# Patient Record
Sex: Female | Born: 1935
Health system: Southern US, Community
[De-identification: ages and names within clinical notes are randomized; demographics above are authoritative.]

## PROBLEM LIST (undated history)

## (undated) DIAGNOSIS — F329 Major depressive disorder, single episode, unspecified: Secondary | ICD-10-CM

## (undated) DIAGNOSIS — D500 Iron deficiency anemia secondary to blood loss (chronic): Secondary | ICD-10-CM

## (undated) DIAGNOSIS — K8020 Calculus of gallbladder without cholecystitis without obstruction: Secondary | ICD-10-CM

## (undated) DIAGNOSIS — R0600 Dyspnea, unspecified: Secondary | ICD-10-CM

## (undated) DIAGNOSIS — M1990 Unspecified osteoarthritis, unspecified site: Secondary | ICD-10-CM

## (undated) DIAGNOSIS — G4710 Hypersomnia, unspecified: Secondary | ICD-10-CM

## (undated) DIAGNOSIS — F0390 Unspecified dementia without behavioral disturbance: Secondary | ICD-10-CM

## (undated) DIAGNOSIS — N289 Disorder of kidney and ureter, unspecified: Secondary | ICD-10-CM

## (undated) DIAGNOSIS — K5790 Diverticulosis of intestine, part unspecified, without perforation or abscess without bleeding: Secondary | ICD-10-CM

## (undated) DIAGNOSIS — S2239XA Fracture of one rib, unspecified side, initial encounter for closed fracture: Secondary | ICD-10-CM

## (undated) DIAGNOSIS — K921 Melena: Secondary | ICD-10-CM

## (undated) DIAGNOSIS — R0989 Other specified symptoms and signs involving the circulatory and respiratory systems: Secondary | ICD-10-CM

## (undated) DIAGNOSIS — E1142 Type 2 diabetes mellitus with diabetic polyneuropathy: Secondary | ICD-10-CM

## (undated) DIAGNOSIS — K922 Gastrointestinal hemorrhage, unspecified: Secondary | ICD-10-CM

## (undated) DIAGNOSIS — R634 Abnormal weight loss: Secondary | ICD-10-CM

## (undated) DIAGNOSIS — G453 Amaurosis fugax: Secondary | ICD-10-CM

## (undated) DIAGNOSIS — I639 Cerebral infarction, unspecified: Secondary | ICD-10-CM

## (undated) DIAGNOSIS — E519 Thiamine deficiency, unspecified: Secondary | ICD-10-CM

## (undated) DIAGNOSIS — N184 Chronic kidney disease, stage 4 (severe): Secondary | ICD-10-CM

## (undated) DIAGNOSIS — I739 Peripheral vascular disease, unspecified: Secondary | ICD-10-CM

## (undated) DIAGNOSIS — R600 Localized edema: Secondary | ICD-10-CM

## (undated) DIAGNOSIS — E119 Type 2 diabetes mellitus without complications: Secondary | ICD-10-CM

## (undated) DIAGNOSIS — E785 Hyperlipidemia, unspecified: Secondary | ICD-10-CM

## (undated) DIAGNOSIS — I10 Essential (primary) hypertension: Secondary | ICD-10-CM

## (undated) DIAGNOSIS — I2510 Atherosclerotic heart disease of native coronary artery without angina pectoris: Secondary | ICD-10-CM

## (undated) HISTORY — DX: Localized edema: R60.0

## (undated) HISTORY — DX: Unspecified dementia, unspecified severity, without behavioral disturbance, psychotic disturbance, mood disturbance, and anxiety: F03.90

## (undated) HISTORY — DX: Type 2 diabetes mellitus without complications: E11.9

## (undated) HISTORY — DX: Fracture of one rib, unspecified side, initial encounter for closed fracture: S22.39XA

## (undated) HISTORY — DX: Gastrointestinal hemorrhage, unspecified: K92.2

## (undated) HISTORY — DX: Unspecified osteoarthritis, unspecified site: M19.90

## (undated) HISTORY — DX: Thiamine deficiency, unspecified: E51.9

## (undated) HISTORY — DX: Amaurosis fugax: G45.3

## (undated) HISTORY — DX: Hypersomnia, unspecified: G47.10

## (undated) HISTORY — DX: Type 2 diabetes mellitus with diabetic polyneuropathy: E11.42

## (undated) HISTORY — DX: Peripheral vascular disease, unspecified: I73.9

## (undated) HISTORY — DX: Other specified symptoms and signs involving the circulatory and respiratory systems: R09.89

## (undated) HISTORY — DX: Atherosclerotic heart disease of native coronary artery without angina pectoris: I25.10

## (undated) HISTORY — PX: ABDOMINAL HYSTERECTOMY: SHX81

## (undated) HISTORY — DX: Disorder of kidney and ureter, unspecified: N28.9

## (undated) HISTORY — DX: Abnormal weight loss: R63.4

## (undated) HISTORY — DX: Hyperlipidemia, unspecified: E78.5

## (undated) HISTORY — DX: Diverticulosis of intestine, part unspecified, without perforation or abscess without bleeding: K57.90

## (undated) HISTORY — DX: Major depressive disorder, single episode, unspecified: F32.9

## (undated) HISTORY — DX: Iron deficiency anemia secondary to blood loss (chronic): D50.0

## (undated) HISTORY — DX: Cerebral infarction, unspecified: I63.9

## (undated) HISTORY — DX: Essential (primary) hypertension: I10

---

## 1997-03-16 HISTORY — PX: CORONARY ARTERY BYPASS GRAFT: SHX141

## 1997-11-30 ENCOUNTER — Inpatient Hospital Stay (HOSPITAL_COMMUNITY): Admission: AD | Admit: 1997-11-30 | Discharge: 1997-12-14 | Payer: Self-pay | Admitting: Interventional Cardiology

## 1997-12-05 ENCOUNTER — Encounter: Payer: Self-pay | Admitting: Cardiothoracic Surgery

## 1997-12-06 ENCOUNTER — Encounter: Payer: Self-pay | Admitting: Cardiothoracic Surgery

## 1997-12-07 ENCOUNTER — Encounter: Payer: Self-pay | Admitting: Cardiothoracic Surgery

## 1997-12-09 ENCOUNTER — Encounter: Payer: Self-pay | Admitting: Cardiothoracic Surgery

## 1997-12-09 ENCOUNTER — Encounter: Payer: Self-pay | Admitting: Pediatrics

## 1997-12-10 ENCOUNTER — Encounter: Payer: Self-pay | Admitting: Cardiothoracic Surgery

## 1997-12-18 ENCOUNTER — Encounter: Admission: RE | Admit: 1997-12-18 | Discharge: 1998-03-18 | Payer: Self-pay | Admitting: Cardiothoracic Surgery

## 1997-12-21 ENCOUNTER — Ambulatory Visit (HOSPITAL_COMMUNITY): Admission: RE | Admit: 1997-12-21 | Discharge: 1997-12-21 | Payer: Self-pay | Admitting: Cardiothoracic Surgery

## 1998-01-08 ENCOUNTER — Encounter (HOSPITAL_COMMUNITY): Admission: RE | Admit: 1998-01-08 | Discharge: 1998-04-08 | Payer: Self-pay | Admitting: Interventional Cardiology

## 1998-10-04 ENCOUNTER — Ambulatory Visit (HOSPITAL_COMMUNITY): Admission: RE | Admit: 1998-10-04 | Discharge: 1998-10-04 | Payer: Self-pay | Admitting: Obstetrics & Gynecology

## 2000-03-02 ENCOUNTER — Encounter: Admission: RE | Admit: 2000-03-02 | Discharge: 2000-03-02 | Payer: Self-pay | Admitting: *Deleted

## 2000-03-02 ENCOUNTER — Encounter: Payer: Self-pay | Admitting: *Deleted

## 2001-03-14 ENCOUNTER — Encounter: Payer: Self-pay | Admitting: Family Medicine

## 2001-03-14 ENCOUNTER — Encounter: Admission: RE | Admit: 2001-03-14 | Discharge: 2001-03-14 | Payer: Self-pay | Admitting: Family Medicine

## 2002-02-28 ENCOUNTER — Encounter: Payer: Self-pay | Admitting: Internal Medicine

## 2002-02-28 ENCOUNTER — Encounter: Admission: RE | Admit: 2002-02-28 | Discharge: 2002-02-28 | Payer: Self-pay | Admitting: Internal Medicine

## 2002-07-29 ENCOUNTER — Emergency Department (HOSPITAL_COMMUNITY): Admission: EM | Admit: 2002-07-29 | Discharge: 2002-07-29 | Payer: Self-pay | Admitting: Physical Therapy

## 2003-03-02 ENCOUNTER — Other Ambulatory Visit: Admission: RE | Admit: 2003-03-02 | Discharge: 2003-03-02 | Payer: Self-pay | Admitting: Family Medicine

## 2003-11-01 ENCOUNTER — Ambulatory Visit (HOSPITAL_COMMUNITY): Admission: RE | Admit: 2003-11-01 | Discharge: 2003-11-01 | Payer: Self-pay | Admitting: Family Medicine

## 2005-03-12 ENCOUNTER — Ambulatory Visit: Admission: RE | Admit: 2005-03-12 | Discharge: 2005-03-12 | Payer: Self-pay | Admitting: Family Medicine

## 2005-03-12 ENCOUNTER — Encounter (INDEPENDENT_AMBULATORY_CARE_PROVIDER_SITE_OTHER): Payer: Self-pay | Admitting: Interventional Cardiology

## 2005-10-05 ENCOUNTER — Ambulatory Visit: Payer: Self-pay | Admitting: Cardiovascular Disease

## 2005-10-14 ENCOUNTER — Ambulatory Visit: Payer: Self-pay

## 2006-03-25 ENCOUNTER — Ambulatory Visit: Payer: Self-pay | Admitting: Cardiovascular Disease

## 2006-09-27 ENCOUNTER — Ambulatory Visit: Payer: Self-pay | Admitting: Cardiovascular Disease

## 2006-10-15 ENCOUNTER — Ambulatory Visit: Payer: Self-pay

## 2007-03-17 DIAGNOSIS — K8020 Calculus of gallbladder without cholecystitis without obstruction: Secondary | ICD-10-CM

## 2007-03-17 HISTORY — DX: Calculus of gallbladder without cholecystitis without obstruction: K80.20

## 2007-06-21 ENCOUNTER — Ambulatory Visit: Payer: Self-pay | Admitting: Internal Medicine

## 2007-06-21 DIAGNOSIS — I739 Peripheral vascular disease, unspecified: Secondary | ICD-10-CM

## 2007-06-21 DIAGNOSIS — R0989 Other specified symptoms and signs involving the circulatory and respiratory systems: Secondary | ICD-10-CM

## 2007-06-21 LAB — CONVERTED CEMR LAB

## 2007-06-22 DIAGNOSIS — I10 Essential (primary) hypertension: Secondary | ICD-10-CM

## 2007-06-22 DIAGNOSIS — E785 Hyperlipidemia, unspecified: Secondary | ICD-10-CM

## 2007-06-22 DIAGNOSIS — E119 Type 2 diabetes mellitus without complications: Secondary | ICD-10-CM

## 2007-07-08 ENCOUNTER — Telehealth: Payer: Self-pay | Admitting: Internal Medicine

## 2007-07-08 ENCOUNTER — Ambulatory Visit: Payer: Self-pay | Admitting: Internal Medicine

## 2007-07-08 ENCOUNTER — Ambulatory Visit: Payer: Self-pay

## 2007-07-10 ENCOUNTER — Encounter: Payer: Self-pay | Admitting: Internal Medicine

## 2007-07-12 ENCOUNTER — Telehealth: Payer: Self-pay | Admitting: Internal Medicine

## 2007-07-27 ENCOUNTER — Ambulatory Visit: Payer: Self-pay | Admitting: Cardiovascular Disease

## 2007-08-04 ENCOUNTER — Ambulatory Visit: Payer: Self-pay | Admitting: Internal Medicine

## 2007-08-10 ENCOUNTER — Telehealth: Payer: Self-pay | Admitting: Internal Medicine

## 2007-08-11 ENCOUNTER — Telehealth (INDEPENDENT_AMBULATORY_CARE_PROVIDER_SITE_OTHER): Payer: Self-pay | Admitting: *Deleted

## 2007-08-12 ENCOUNTER — Inpatient Hospital Stay (HOSPITAL_COMMUNITY): Admission: AD | Admit: 2007-08-12 | Discharge: 2007-08-14 | Payer: Self-pay | Admitting: Internal Medicine

## 2007-08-12 ENCOUNTER — Encounter: Payer: Self-pay | Admitting: Internal Medicine

## 2007-08-12 ENCOUNTER — Ambulatory Visit: Payer: Self-pay | Admitting: Cardiology

## 2007-08-12 ENCOUNTER — Ambulatory Visit: Payer: Self-pay | Admitting: Internal Medicine

## 2007-08-12 DIAGNOSIS — H3400 Transient retinal artery occlusion, unspecified eye: Secondary | ICD-10-CM

## 2007-08-19 ENCOUNTER — Ambulatory Visit: Payer: Self-pay | Admitting: Cardiovascular Disease

## 2007-08-23 ENCOUNTER — Ambulatory Visit: Payer: Self-pay | Admitting: Internal Medicine

## 2007-08-25 ENCOUNTER — Ambulatory Visit: Payer: Self-pay | Admitting: Cardiology

## 2007-09-26 ENCOUNTER — Ambulatory Visit: Payer: Self-pay | Admitting: Internal Medicine

## 2007-09-26 LAB — CONVERTED CEMR LAB
ALT: 21 units/L (ref 0–35)
LDL Cholesterol: 83 mg/dL (ref 0–99)
Total CHOL/HDL Ratio: 3

## 2007-10-05 ENCOUNTER — Ambulatory Visit: Payer: Self-pay | Admitting: Internal Medicine

## 2007-11-17 ENCOUNTER — Telehealth: Payer: Self-pay | Admitting: Internal Medicine

## 2007-12-26 ENCOUNTER — Ambulatory Visit: Payer: Self-pay | Admitting: Internal Medicine

## 2007-12-26 LAB — CONVERTED CEMR LAB
Creatinine,U: 90.1 mg/dL
Eosinophils Absolute: 0.2 10*3/uL (ref 0.0–0.7)
Eosinophils Relative: 2.8 % (ref 0.0–5.0)
MCV: 90 fL (ref 78.0–100.0)
Microalb, Ur: 180.5 mg/dL — ABNORMAL HIGH (ref 0.0–1.9)
Monocytes Relative: 8.3 % (ref 3.0–12.0)
Neutrophils Relative %: 57.7 % (ref 43.0–77.0)
Platelets: 197 10*3/uL (ref 150–400)
Saturation Ratios: 22.3 % (ref 20.0–50.0)
WBC: 8 10*3/uL (ref 4.5–10.5)

## 2008-01-09 ENCOUNTER — Ambulatory Visit: Payer: Self-pay | Admitting: Internal Medicine

## 2008-01-13 ENCOUNTER — Encounter: Payer: Self-pay | Admitting: Internal Medicine

## 2008-01-16 ENCOUNTER — Ambulatory Visit: Payer: Self-pay | Admitting: Cardiovascular Disease

## 2008-02-06 ENCOUNTER — Telehealth: Payer: Self-pay | Admitting: Internal Medicine

## 2008-02-08 ENCOUNTER — Encounter: Payer: Self-pay | Admitting: Internal Medicine

## 2008-02-20 ENCOUNTER — Ambulatory Visit: Payer: Self-pay | Admitting: Internal Medicine

## 2008-02-20 ENCOUNTER — Ambulatory Visit: Payer: Self-pay | Admitting: Diagnostic Radiology

## 2008-02-20 ENCOUNTER — Encounter: Payer: Self-pay | Admitting: Emergency Medicine

## 2008-02-20 DIAGNOSIS — R5381 Other malaise: Secondary | ICD-10-CM

## 2008-02-20 DIAGNOSIS — R5383 Other fatigue: Secondary | ICD-10-CM

## 2008-02-21 ENCOUNTER — Inpatient Hospital Stay (HOSPITAL_COMMUNITY): Admission: EM | Admit: 2008-02-21 | Discharge: 2008-02-24 | Payer: Self-pay | Admitting: Internal Medicine

## 2008-02-21 ENCOUNTER — Ambulatory Visit: Payer: Self-pay | Admitting: Internal Medicine

## 2008-02-22 ENCOUNTER — Encounter: Payer: Self-pay | Admitting: Internal Medicine

## 2008-02-22 HISTORY — PX: UPPER GASTROINTESTINAL ENDOSCOPY: SHX188

## 2008-02-24 ENCOUNTER — Encounter: Payer: Self-pay | Admitting: Internal Medicine

## 2008-02-28 ENCOUNTER — Ambulatory Visit: Payer: Self-pay | Admitting: Internal Medicine

## 2008-02-28 DIAGNOSIS — K3183 Achlorhydria: Secondary | ICD-10-CM | POA: Insufficient documentation

## 2008-02-28 DIAGNOSIS — R197 Diarrhea, unspecified: Secondary | ICD-10-CM

## 2008-02-28 LAB — CONVERTED CEMR LAB
BUN: 27 mg/dL — ABNORMAL HIGH (ref 6–23)
Basophils Absolute: 0.1 10*3/uL (ref 0.0–0.1)
Basophils Relative: 0.9 % (ref 0.0–3.0)
CO2: 25 meq/L (ref 19–32)
Calcium: 9.4 mg/dL (ref 8.4–10.5)
Chloride: 113 meq/L — ABNORMAL HIGH (ref 96–112)
Creatinine, Ser: 1.2 mg/dL (ref 0.4–1.2)
Eosinophils Absolute: 0.2 10*3/uL (ref 0.0–0.7)
Eosinophils Relative: 1.8 % (ref 0.0–5.0)
GFR calc Af Amer: 56 mL/min
GFR calc non Af Amer: 47 mL/min
Glucose, Bld: 73 mg/dL (ref 70–99)
HCT: 30.9 % — ABNORMAL LOW (ref 36.0–46.0)
Hemoglobin: 10.7 g/dL — ABNORMAL LOW (ref 12.0–15.0)
Lymphocytes Relative: 18.2 % (ref 12.0–46.0)
MCHC: 34.6 g/dL (ref 30.0–36.0)
MCV: 89.2 fL (ref 78.0–100.0)
Monocytes Absolute: 0.6 10*3/uL (ref 0.1–1.0)
Monocytes Relative: 6 % (ref 3.0–12.0)
Neutro Abs: 7.7 10*3/uL (ref 1.4–7.7)
Neutrophils Relative %: 73.1 % (ref 43.0–77.0)
Platelets: 269 10*3/uL (ref 150–400)
Potassium: 5.2 meq/L — ABNORMAL HIGH (ref 3.5–5.1)
RBC: 3.47 M/uL — ABNORMAL LOW (ref 3.87–5.11)
RDW: 13.9 % (ref 11.5–14.6)
Sodium: 144 meq/L (ref 135–145)
WBC: 10.5 10*3/uL (ref 4.5–10.5)

## 2008-02-29 ENCOUNTER — Telehealth (INDEPENDENT_AMBULATORY_CARE_PROVIDER_SITE_OTHER): Payer: Self-pay | Admitting: *Deleted

## 2008-02-29 DIAGNOSIS — E875 Hyperkalemia: Secondary | ICD-10-CM | POA: Insufficient documentation

## 2008-03-02 ENCOUNTER — Encounter: Payer: Self-pay | Admitting: Internal Medicine

## 2008-03-04 ENCOUNTER — Telehealth (INDEPENDENT_AMBULATORY_CARE_PROVIDER_SITE_OTHER): Payer: Self-pay | Admitting: *Deleted

## 2008-03-19 ENCOUNTER — Encounter: Payer: Self-pay | Admitting: Internal Medicine

## 2008-03-23 ENCOUNTER — Ambulatory Visit: Payer: Self-pay | Admitting: Internal Medicine

## 2008-03-23 DIAGNOSIS — K260 Acute duodenal ulcer with hemorrhage: Secondary | ICD-10-CM | POA: Insufficient documentation

## 2008-03-26 ENCOUNTER — Ambulatory Visit: Payer: Self-pay | Admitting: Internal Medicine

## 2008-03-26 LAB — CONVERTED CEMR LAB
ALT: 14 units/L (ref 0–35)
BUN: 18 mg/dL (ref 6–23)
Basophils Absolute: 0.1 10*3/uL (ref 0.0–0.1)
Chloride: 109 meq/L (ref 96–112)
Cholesterol: 145 mg/dL (ref 0–200)
Creatinine, Ser: 0.9 mg/dL (ref 0.4–1.2)
GFR calc Af Amer: 79 mL/min
GFR calc non Af Amer: 65 mL/min
Glucose, Bld: 75 mg/dL (ref 70–99)
Hgb A1c MFr Bld: 5.8 % (ref 4.6–6.0)
LDL Cholesterol: 67 mg/dL (ref 0–99)
Lymphocytes Relative: 35.9 % (ref 12.0–46.0)
MCHC: 34.6 g/dL (ref 30.0–36.0)
Microalb Creat Ratio: 1157.3 mg/g — ABNORMAL HIGH (ref 0.0–30.0)
Monocytes Relative: 9.4 % (ref 3.0–12.0)
Neutrophils Relative %: 51.3 % (ref 43.0–77.0)
Platelets: 184 10*3/uL (ref 150–400)
RDW: 13.6 % (ref 11.5–14.6)
Triglycerides: 109 mg/dL (ref 0–149)
VLDL: 22 mg/dL (ref 0–40)

## 2008-03-30 ENCOUNTER — Ambulatory Visit: Payer: Self-pay | Admitting: Internal Medicine

## 2008-03-30 LAB — HM DIABETES FOOT EXAM

## 2008-04-09 ENCOUNTER — Encounter: Payer: Self-pay | Admitting: Internal Medicine

## 2008-04-18 ENCOUNTER — Encounter: Payer: Self-pay | Admitting: Internal Medicine

## 2008-04-26 ENCOUNTER — Encounter: Payer: Self-pay | Admitting: Internal Medicine

## 2008-05-21 ENCOUNTER — Ambulatory Visit: Payer: Self-pay | Admitting: Internal Medicine

## 2008-05-21 ENCOUNTER — Telehealth (INDEPENDENT_AMBULATORY_CARE_PROVIDER_SITE_OTHER): Payer: Self-pay | Admitting: *Deleted

## 2008-05-21 LAB — CONVERTED CEMR LAB
Calcium: 9.2 mg/dL (ref 8.4–10.5)
GFR calc Af Amer: 78 mL/min
Glucose, Bld: 112 mg/dL — ABNORMAL HIGH (ref 70–99)
Potassium: 4.4 meq/L (ref 3.5–5.1)
Sodium: 150 meq/L — ABNORMAL HIGH (ref 135–145)

## 2008-05-23 ENCOUNTER — Ambulatory Visit: Payer: Self-pay | Admitting: Internal Medicine

## 2008-05-23 DIAGNOSIS — E870 Hyperosmolality and hypernatremia: Secondary | ICD-10-CM

## 2008-06-25 ENCOUNTER — Ambulatory Visit: Payer: Self-pay | Admitting: Internal Medicine

## 2008-06-25 LAB — CONVERTED CEMR LAB
CO2: 29 meq/L (ref 19–32)
Calcium: 9.5 mg/dL (ref 8.4–10.5)
Creatinine, Ser: 1 mg/dL (ref 0.4–1.2)
Glucose, Bld: 149 mg/dL — ABNORMAL HIGH (ref 70–99)
Sodium: 143 meq/L (ref 135–145)

## 2008-06-26 ENCOUNTER — Ambulatory Visit: Payer: Self-pay | Admitting: Internal Medicine

## 2008-07-04 DIAGNOSIS — I6350 Cerebral infarction due to unspecified occlusion or stenosis of unspecified cerebral artery: Secondary | ICD-10-CM | POA: Insufficient documentation

## 2008-07-04 DIAGNOSIS — R011 Cardiac murmur, unspecified: Secondary | ICD-10-CM

## 2008-07-09 ENCOUNTER — Ambulatory Visit: Payer: Self-pay | Admitting: Cardiovascular Disease

## 2008-07-12 ENCOUNTER — Encounter: Payer: Self-pay | Admitting: Cardiovascular Disease

## 2008-08-24 ENCOUNTER — Ambulatory Visit: Payer: Self-pay | Admitting: Internal Medicine

## 2008-08-24 LAB — CONVERTED CEMR LAB
BUN: 19 mg/dL (ref 6–23)
Calcium: 9.3 mg/dL (ref 8.4–10.5)
Creatinine, Ser: 1.2 mg/dL (ref 0.4–1.2)
GFR calc non Af Amer: 46.5 mL/min (ref >60)
Hgb A1c MFr Bld: 6.1 % (ref 4.6–6.5)

## 2008-08-28 ENCOUNTER — Ambulatory Visit: Payer: Self-pay | Admitting: Internal Medicine

## 2008-08-28 DIAGNOSIS — E1140 Type 2 diabetes mellitus with diabetic neuropathy, unspecified: Secondary | ICD-10-CM | POA: Insufficient documentation

## 2008-12-24 ENCOUNTER — Ambulatory Visit: Payer: Self-pay | Admitting: Internal Medicine

## 2008-12-24 LAB — CONVERTED CEMR LAB
BUN: 29 mg/dL — ABNORMAL HIGH (ref 6–23)
Creatinine, Ser: 1.3 mg/dL — ABNORMAL HIGH (ref 0.4–1.2)
GFR calc non Af Amer: 42.36 mL/min (ref >60)
HDL: 52.7 mg/dL (ref >39.00)
Hgb A1c MFr Bld: 5.9 % (ref 4.6–6.5)
Ketones, ur: NEGATIVE mg/dL
LDL Cholesterol: 84 mg/dL (ref 0–99)
Specific Gravity, Urine: 1.01 (ref 1.000–1.030)
TSH: 1.56 microintl units/mL (ref 0.35–5.50)
Total Bilirubin: 0.7 mg/dL (ref 0.3–1.2)
Total CHOL/HDL Ratio: 3
Urobilinogen, UA: 0.2 (ref 0.0–1.0)

## 2008-12-25 ENCOUNTER — Ambulatory Visit: Payer: Self-pay | Admitting: Internal Medicine

## 2008-12-27 ENCOUNTER — Telehealth (INDEPENDENT_AMBULATORY_CARE_PROVIDER_SITE_OTHER): Payer: Self-pay | Admitting: *Deleted

## 2008-12-31 ENCOUNTER — Ambulatory Visit: Payer: Self-pay

## 2008-12-31 ENCOUNTER — Encounter: Payer: Self-pay | Admitting: Cardiovascular Disease

## 2008-12-31 ENCOUNTER — Ambulatory Visit: Payer: Self-pay | Admitting: Cardiovascular Disease

## 2008-12-31 ENCOUNTER — Ambulatory Visit: Payer: Self-pay | Admitting: Internal Medicine

## 2008-12-31 ENCOUNTER — Encounter (INDEPENDENT_AMBULATORY_CARE_PROVIDER_SITE_OTHER): Payer: Self-pay | Admitting: *Deleted

## 2008-12-31 ENCOUNTER — Encounter (HOSPITAL_COMMUNITY): Admission: RE | Admit: 2008-12-31 | Discharge: 2009-03-12 | Payer: Self-pay | Admitting: Internal Medicine

## 2009-01-03 ENCOUNTER — Observation Stay (HOSPITAL_COMMUNITY): Admission: AD | Admit: 2009-01-03 | Discharge: 2009-01-05 | Payer: Self-pay | Admitting: Cardiovascular Disease

## 2009-01-03 ENCOUNTER — Ambulatory Visit: Payer: Self-pay | Admitting: Cardiovascular Disease

## 2009-01-04 ENCOUNTER — Ambulatory Visit: Payer: Self-pay | Admitting: Vascular Surgery

## 2009-01-04 ENCOUNTER — Encounter: Payer: Self-pay | Admitting: Cardiovascular Disease

## 2009-01-08 ENCOUNTER — Telehealth: Payer: Self-pay | Admitting: Cardiovascular Disease

## 2009-01-11 ENCOUNTER — Telehealth (INDEPENDENT_AMBULATORY_CARE_PROVIDER_SITE_OTHER): Payer: Self-pay | Admitting: *Deleted

## 2009-01-21 ENCOUNTER — Encounter (INDEPENDENT_AMBULATORY_CARE_PROVIDER_SITE_OTHER): Payer: Self-pay | Admitting: *Deleted

## 2009-01-22 ENCOUNTER — Encounter: Payer: Self-pay | Admitting: Physician Assistant

## 2009-01-22 ENCOUNTER — Ambulatory Visit: Payer: Self-pay | Admitting: Cardiology

## 2009-01-28 ENCOUNTER — Ambulatory Visit: Payer: Self-pay | Admitting: Internal Medicine

## 2009-01-28 LAB — CONVERTED CEMR LAB
CO2: 24 meq/L (ref 19–32)
Chloride: 106 meq/L (ref 96–112)
Potassium: 4.9 meq/L (ref 3.5–5.3)
Sodium: 141 meq/L (ref 135–145)

## 2009-01-29 ENCOUNTER — Encounter: Payer: Self-pay | Admitting: Internal Medicine

## 2009-03-25 ENCOUNTER — Ambulatory Visit: Payer: Self-pay | Admitting: Internal Medicine

## 2009-03-25 LAB — CONVERTED CEMR LAB
BUN: 18 mg/dL (ref 6–23)
Chloride: 107 meq/L (ref 96–112)
GFR calc non Af Amer: 51.34 mL/min (ref >60)
Glucose, Bld: 134 mg/dL — ABNORMAL HIGH (ref 70–99)
Potassium: 4.3 meq/L (ref 3.5–5.1)
Sodium: 145 meq/L (ref 135–145)

## 2009-03-28 ENCOUNTER — Ambulatory Visit: Payer: Self-pay | Admitting: Internal Medicine

## 2009-04-15 ENCOUNTER — Ambulatory Visit: Payer: Self-pay | Admitting: Cardiovascular Disease

## 2009-04-17 ENCOUNTER — Telehealth: Payer: Self-pay | Admitting: Internal Medicine

## 2009-04-24 ENCOUNTER — Ambulatory Visit: Payer: Self-pay

## 2009-04-24 ENCOUNTER — Ambulatory Visit: Payer: Self-pay | Admitting: Internal Medicine

## 2009-04-24 ENCOUNTER — Encounter: Payer: Self-pay | Admitting: Cardiovascular Disease

## 2009-04-24 LAB — CONVERTED CEMR LAB
CO2: 27 meq/L (ref 19–32)
Calcium: 9.6 mg/dL (ref 8.4–10.5)
Chloride: 100 meq/L (ref 96–112)
Creatinine, Ser: 1.1 mg/dL (ref 0.4–1.2)
Glucose, Bld: 112 mg/dL — ABNORMAL HIGH (ref 70–99)

## 2009-04-26 ENCOUNTER — Ambulatory Visit: Payer: Self-pay | Admitting: Internal Medicine

## 2009-04-26 DIAGNOSIS — N390 Urinary tract infection, site not specified: Secondary | ICD-10-CM | POA: Insufficient documentation

## 2009-04-26 LAB — CONVERTED CEMR LAB
Bilirubin Urine: NEGATIVE
Ketones, urine, test strip: NEGATIVE
Specific Gravity, Urine: 1.015
pH: 5

## 2009-04-29 ENCOUNTER — Ambulatory Visit: Payer: Self-pay | Admitting: Cardiovascular Disease

## 2009-05-09 ENCOUNTER — Telehealth (INDEPENDENT_AMBULATORY_CARE_PROVIDER_SITE_OTHER): Payer: Self-pay | Admitting: *Deleted

## 2009-05-21 ENCOUNTER — Telehealth: Payer: Self-pay | Admitting: Internal Medicine

## 2009-05-23 ENCOUNTER — Telehealth: Payer: Self-pay | Admitting: Internal Medicine

## 2009-06-06 ENCOUNTER — Encounter: Payer: Self-pay | Admitting: Internal Medicine

## 2009-07-09 ENCOUNTER — Encounter: Payer: Self-pay | Admitting: Cardiovascular Disease

## 2009-07-17 ENCOUNTER — Ambulatory Visit: Payer: Self-pay | Admitting: Internal Medicine

## 2009-07-17 LAB — CONVERTED CEMR LAB
CO2: 28 meq/L (ref 19–32)
Calcium: 9.8 mg/dL (ref 8.4–10.5)
Chloride: 108 meq/L (ref 96–112)
Glucose, Bld: 132 mg/dL — ABNORMAL HIGH (ref 70–99)
HDL: 54 mg/dL (ref >39.00)
Hgb A1c MFr Bld: 6.5 % (ref 4.6–6.5)
LDL Cholesterol: 79 mg/dL (ref 0–99)
Sodium: 144 meq/L (ref 135–145)
Total CHOL/HDL Ratio: 3
Triglycerides: 138 mg/dL (ref 0.0–149.0)
VLDL: 27.6 mg/dL (ref 0.0–40.0)

## 2009-07-24 ENCOUNTER — Ambulatory Visit: Payer: Self-pay | Admitting: Internal Medicine

## 2009-07-24 ENCOUNTER — Encounter: Payer: Self-pay | Admitting: Cardiovascular Disease

## 2009-07-24 DIAGNOSIS — R079 Chest pain, unspecified: Secondary | ICD-10-CM

## 2009-07-29 ENCOUNTER — Telehealth (INDEPENDENT_AMBULATORY_CARE_PROVIDER_SITE_OTHER): Payer: Self-pay | Admitting: *Deleted

## 2009-07-30 ENCOUNTER — Ambulatory Visit: Payer: Self-pay | Admitting: Cardiology

## 2009-07-30 ENCOUNTER — Encounter (HOSPITAL_COMMUNITY): Admission: RE | Admit: 2009-07-30 | Discharge: 2009-07-30 | Payer: Self-pay | Admitting: Cardiovascular Disease

## 2009-07-30 ENCOUNTER — Ambulatory Visit: Payer: Self-pay

## 2009-07-30 ENCOUNTER — Encounter: Payer: Self-pay | Admitting: Cardiovascular Disease

## 2009-08-02 ENCOUNTER — Telehealth: Payer: Self-pay | Admitting: Internal Medicine

## 2009-08-07 ENCOUNTER — Encounter: Payer: Self-pay | Admitting: Internal Medicine

## 2009-08-07 LAB — HM DIABETES EYE EXAM: HM Diabetic Eye Exam: NORMAL

## 2009-08-13 ENCOUNTER — Ambulatory Visit: Payer: Self-pay | Admitting: Cardiovascular Disease

## 2009-08-26 ENCOUNTER — Ambulatory Visit: Payer: Self-pay | Admitting: Internal Medicine

## 2009-11-11 ENCOUNTER — Ambulatory Visit: Payer: Self-pay | Admitting: Cardiovascular Disease

## 2009-11-22 ENCOUNTER — Ambulatory Visit: Payer: Self-pay | Admitting: Internal Medicine

## 2009-11-22 LAB — CONVERTED CEMR LAB
BUN: 24 mg/dL — ABNORMAL HIGH (ref 6–23)
CO2: 25 meq/L (ref 19–32)
Calcium: 9.6 mg/dL (ref 8.4–10.5)
Creatinine, Ser: 1.5 mg/dL — ABNORMAL HIGH (ref 0.4–1.2)
GFR calc non Af Amer: 35.28 mL/min (ref >60)
Glucose, Bld: 141 mg/dL — ABNORMAL HIGH (ref 70–99)

## 2009-11-27 ENCOUNTER — Telehealth: Payer: Self-pay | Admitting: Internal Medicine

## 2009-11-27 ENCOUNTER — Ambulatory Visit (HOSPITAL_BASED_OUTPATIENT_CLINIC_OR_DEPARTMENT_OTHER): Admission: RE | Admit: 2009-11-27 | Discharge: 2009-11-27 | Payer: Self-pay | Admitting: Internal Medicine

## 2009-11-27 ENCOUNTER — Ambulatory Visit: Payer: Self-pay | Admitting: Diagnostic Radiology

## 2009-11-27 ENCOUNTER — Ambulatory Visit: Payer: Self-pay | Admitting: Internal Medicine

## 2009-11-27 LAB — CONVERTED CEMR LAB
Bilirubin Urine: NEGATIVE
Ketones, ur: NEGATIVE mg/dL
Protein, ur: 100 mg/dL — AB
Squamous Epithelial / LPF: NONE SEEN /lpf
Urine Glucose: NEGATIVE mg/dL
Urobilinogen, UA: 0.2 (ref 0.0–1.0)

## 2009-12-19 ENCOUNTER — Ambulatory Visit: Payer: Self-pay

## 2009-12-19 ENCOUNTER — Encounter: Payer: Self-pay | Admitting: Cardiovascular Disease

## 2009-12-19 ENCOUNTER — Ambulatory Visit: Payer: Self-pay | Admitting: Cardiovascular Disease

## 2009-12-26 ENCOUNTER — Ambulatory Visit: Payer: Self-pay | Admitting: Internal Medicine

## 2009-12-26 LAB — CONVERTED CEMR LAB
BUN: 19 mg/dL (ref 6–23)
Potassium: 5.3 meq/L (ref 3.5–5.3)

## 2010-01-14 ENCOUNTER — Telehealth: Payer: Self-pay | Admitting: Internal Medicine

## 2010-03-07 ENCOUNTER — Telehealth: Payer: Self-pay | Admitting: Internal Medicine

## 2010-03-19 ENCOUNTER — Other Ambulatory Visit: Payer: Self-pay | Admitting: Internal Medicine

## 2010-03-19 ENCOUNTER — Ambulatory Visit
Admission: RE | Admit: 2010-03-19 | Discharge: 2010-03-19 | Payer: Self-pay | Source: Home / Self Care | Attending: Internal Medicine | Admitting: Internal Medicine

## 2010-03-19 LAB — BASIC METABOLIC PANEL
BUN: 33 mg/dL — ABNORMAL HIGH (ref 6–23)
CO2: 24 mEq/L (ref 19–32)
Calcium: 9.5 mg/dL (ref 8.4–10.5)
Chloride: 106 mEq/L (ref 96–112)
Creatinine, Ser: 1.2 mg/dL (ref 0.4–1.2)
GFR: 47.68 mL/min — ABNORMAL LOW (ref >60.00)
Glucose, Bld: 175 mg/dL — ABNORMAL HIGH (ref 70–99)
Potassium: 4.7 mEq/L (ref 3.5–5.1)
Sodium: 138 mEq/L (ref 135–145)

## 2010-03-19 LAB — HEMOGLOBIN A1C: Hgb A1c MFr Bld: 8.8 % — ABNORMAL HIGH (ref 4.6–6.5)

## 2010-03-27 ENCOUNTER — Ambulatory Visit
Admission: RE | Admit: 2010-03-27 | Discharge: 2010-03-27 | Payer: Self-pay | Source: Home / Self Care | Attending: Internal Medicine | Admitting: Internal Medicine

## 2010-04-08 ENCOUNTER — Telehealth: Payer: Self-pay | Admitting: Internal Medicine

## 2010-04-11 ENCOUNTER — Telehealth: Payer: Self-pay | Admitting: Cardiovascular Disease

## 2010-04-13 LAB — CONVERTED CEMR LAB
ALT: 21 units/L (ref 0–35)
AST: 19 units/L (ref 0–37)
Albumin: 3.9 g/dL (ref 3.5–5.2)
BUN: 27 mg/dL — ABNORMAL HIGH (ref 6–23)
Basophils Absolute: 0.1 10*3/uL (ref 0.0–0.1)
Basophils Relative: 0.5 % (ref 0.0–3.0)
Basophils Relative: 1.2 % — ABNORMAL HIGH (ref 0.0–1.0)
Bilirubin Urine: NEGATIVE
CO2: 29 meq/L (ref 19–32)
CO2: 29 meq/L (ref 19–32)
Calcium: 9.5 mg/dL (ref 8.4–10.5)
Chloride: 105 meq/L (ref 96–112)
Creatinine, Ser: 1.1 mg/dL (ref 0.4–1.2)
Eosinophils Relative: 2.5 % (ref 0.0–5.0)
GFR calc non Af Amer: 57.34 mL/min (ref >60)
HCT: 31.3 % — ABNORMAL LOW (ref 36.0–46.0)
Hemoglobin: 10.6 g/dL — ABNORMAL LOW (ref 12.0–15.0)
Hemoglobin: 11.9 g/dL — ABNORMAL LOW (ref 12.0–15.0)
Hgb A1c MFr Bld: 6.3 % — ABNORMAL HIGH (ref 4.6–6.0)
INR: 1.1 — ABNORMAL HIGH (ref 0.8–1.0)
Ketones, ur: NEGATIVE mg/dL
Lymphocytes Relative: 26 % (ref 12.0–46.0)
Lymphocytes Relative: 31.4 % (ref 12.0–46.0)
MCHC: 33.9 g/dL (ref 30.0–36.0)
MCHC: 35 g/dL (ref 30.0–36.0)
MCV: 92 fL (ref 78.0–100.0)
Monocytes Absolute: 0.6 10*3/uL (ref 0.1–1.0)
Monocytes Relative: 6.7 % (ref 3.0–12.0)
Mucus, UA: NEGATIVE
Neutro Abs: 3.4 10*3/uL (ref 1.4–7.7)
Neutro Abs: 5.6 10*3/uL (ref 1.4–7.7)
Neutrophils Relative %: 64.3 % (ref 43.0–77.0)
RBC: 3.4 M/uL — ABNORMAL LOW (ref 3.87–5.11)
RBC: 3.77 M/uL — ABNORMAL LOW (ref 3.87–5.11)
Sodium: 145 meq/L (ref 135–145)
VLDL: 49 mg/dL — ABNORMAL HIGH (ref 0–40)
WBC: 8.6 10*3/uL (ref 4.5–10.5)
pH: 5 (ref 5.0–8.0)

## 2010-04-14 ENCOUNTER — Telehealth: Payer: Self-pay | Admitting: Internal Medicine

## 2010-04-15 NOTE — Progress Notes (Signed)
Summary: [facility]  Phone Note Refill Request Message from:  Patient on May 23, 2009 9:20 AM  Refills Requested: Medication #1:  METFORMIN HCL 500 MG XR24H-TAB one by mouth three times a day   Dosage confirmed as above?Dosage Confirmed   Brand Name Necessary? No   Supply Requested: 3 months  Medication #2:  TOPROL XL 50 MG  TB24 1/2 tablet by mouth once daily   Dosage confirmed as above?Dosage Confirmed   Brand Name Necessary? No   Supply Requested: 3 months  Medication #3:  ARICEPT 10 MG  TABS 1/2 tab by mouth once daily   Dosage confirmed as above?Dosage Confirmed   Brand Name Necessary? No   Supply Requested: 3 months  Medication #4:  NAMENDA 10 MG  TABS one by mouth two times a day   Dosage confirmed as above?Dosage Confirmed   Brand Name Necessary? No   Supply Requested: 3 months [facility] phone # [phone]      Method Requested: Electronic Next Appointment Scheduled: 07-17-09  Initial call taken by: Shanon Payor,  May 23, 2009 9:22 AM  Follow-up for Phone Call        Rx completed in Dr. Brantley Stage Follow-up by: Jiles Garter CMA,  May 23, 2009 9:55 AM    Prescriptions: FUROSEMIDE 20 MG TABS (FUROSEMIDE) 1 by mouth once daily  #90 x 3   Entered by:   Jiles Garter CMA   Authorized by:   D. Drema Pry DO   Signed by:   Jiles Garter CMA on 05/23/2009   Method used:   Faxed to ...       [facility] (mail-order)             , [place]         Ph: [phone]       Fax: [phone]   RxID:   [id number] AMLODIPINE BESYLATE 10 MG TABS (AMLODIPINE BESYLATE) one by mouth qd  #90 x 3   Entered by:   Jiles Garter CMA   Authorized by:   D. Drema Pry DO   Signed by:   Jiles Garter CMA on 05/23/2009   Method used:   Faxed to ...       [facility] (mail-order)             , [place]         Ph: [phone]       Fax: [phone]   RxID:   [id number] QUINAPRIL HCL 40 MG TABS (QUINAPRIL HCL) one by mouth qd  #90 x 3  Entered by:   Jiles Garter CMA   Authorized by:   D. Drema Pry DO   Signed by:   Jiles Garter CMA on 05/23/2009   Method used:   Faxed to ...       [facility] (mail-order)             , [place]         Ph: [phone]       Fax: [phone]   RxID:   [id number] PANTOPRAZOLE SODIUM 40 MG TBEC (PANTOPRAZOLE SODIUM) 1 each day 30 minutes before meal  #90 x 3   Entered by:   Jiles Garter CMA   Authorized by:   D. Drema Pry DO   Signed by:   Jiles Garter CMA on 05/23/2009   Method used:   Faxed to ...       [facility] officer)             ,  [place]         Ph: [phone]       Fax: [phone]   [id number] NAMENDA 10 MG  TABS (MEMANTINE HCL) one by mouth two times a day  #180 x 3   Entered by:   Jiles Garter CMA   Authorized by:   D. Drema Pry DO   Signed by:   Jiles Garter CMA on 05/23/2009   Method used:   Faxed to ...       [facility] (mail-order)             , [place]         Ph: [phone]       Fax: [phone]   RxID:   [id number] ARICEPT 10 MG  TABS (DONEPEZIL HCL) 1/2 tab by mouth once daily  #45 x 3   Entered by:   Jiles Garter CMA   Authorized by:   D. Drema Pry DO   Signed by:   Jiles Garter CMA on 05/23/2009   Method used:   Faxed to ...       [facility] (mail-order)             , [place]         Ph: [phone]       Fax: [phone]   RxID:   [id number] TOPROL XL 50 MG  TB24 (METOPROLOL SUCCINATE) 1/2 tablet by mouth once daily  #45 x 3   Entered by:   Jiles Garter CMA   Authorized by:   D. Drema Pry DO   Signed by:   Jiles Garter CMA on 05/23/2009   Method used:   Faxed to ...       [facility] (mail-order)             , [place]         Ph: [phone]       Fax: [phone]   RxID:   [id number] METFORMIN HCL 500 MG XR24H-TAB (METFORMIN HCL) one by mouth three times a day  #270 x 3   Entered by:   Jiles Garter CMA   Authorized by:   D. Drema Pry DO   Signed  by:   Jiles Garter CMA on 05/23/2009   Method used:   Faxed to ...       [facility] (mail-order)             , [place]         Ph: [phone]       Fax: [phone]   RxID:   [id number]

## 2010-04-15 NOTE — Letter (Signed)
Summary: [facility]   Imported By: Edmonia James 08/16/2009 11:08:20  _____________________________________________________________________  External Attachment:    Type:   Image     Comment:   External Document

## 2010-04-15 NOTE — Assessment & Plan Note (Signed)
Summary: 3 mo. f/u - jr   Vital Signs:  Patient profile:   77 year old female Height:      66 inches Weight:      158.50 pounds BMI:     25.68 O2 Sat:      100 % Temp:     97.6 degrees F oral Pulse rate:   49 / minute Pulse rhythm:   regular Resp:     16 per minute BP sitting:   120 / 50  (right arm) Cuff size:   large  Vitals Entered By: Jiles Garter CMA (Jul 24, 2009 11:27 AM) CC: Rm 3 - 3 Month Follow up disease management Is Patient Diabetic? Yes Did you bring your meter with you today? No   Primary Care Provider:  Jennings Books, DO  CC:  Rm 3 - 3 Month Follow up disease management.  History of Present Illness: 77 y/o with hx of CAD, DM II for f/u 6 weeks ago - exp chest pain.  arms felt tight.  "felt like I was having a heart attack". her family urged her to call 911 but she refused  since her initial episode, no recurrence of chest pain she reports feeling mores short of breath esp in the afternoon no orthopnea she notes uncomfortable sensation upper abd.  abd feels full/bloated  DM II - she told my nurse she is not checking her blood sugar but then told me she checks in AM.  she does not keep log.  she does not have glucometer with her today she denies hypoglycemia symptoms    EKG  Procedure date:  07/24/2009  Findings:      sinus bradycardia at 47 bpm,  incomplete RBBB  Allergies (verified): No Known Drug Allergies  Past History:  Past Medical History: Current Problems:  MURMUR (ICD-785.2) PVD  CVA (ICD-434.91)    CORONARY ARTERY DISEASE (ICD-414.00) Distant CABG HYPERTENSION (ICD-401.9) HYPERNATREMIA (ICD-276.0) HYPERLIPIDEMIA (ICD-272.4) ? DIVERTICULITIS ON CT (ICD-793.4)  DUODENAL ULCER, ACUTE, HEMORRHAGE (ICD-532.00) HYPERKALEMIA (ICD-276.7) DIARRHEA (ICD-787.91) ACHLORHYDRIA (ICD-536.0) ANEMIA, SECONDARY TO ACUTE BLOOD LOSS (ICD-285.1) WEAKNESS (ICD-780.79) CALLUS (ICD-700) AMAUROSIS FUGAX (ICD-362.34) UNSPECIFIED PERIPHERAL  VASCULAR DISEASE (ICD-443.9) CAROTID BRUIT (ICD-785.9) DIABETES MELLITUS, TYPE II (ICD-250.00) DEMENTIA (ICD-294.8) DIABETIC NEUROPATHY  Past Surgical History: Coronary artery bypass graft 1999        hysterectomy      Family History: Family History of CAD Family History Hypertension Family History of Stroke   Husband has advanced dementia        Social History: Married lives with daughter with epilepsy - mental retardation Retired   Former Smoker   Alcohol use-no       Occupation: Retired       Review of Systems       The patient complains of dyspnea on exertion.  The patient denies weight gain.    Physical Exam  General:  alert, well-developed, and well-nourished.   Head:  normocephalic and atraumatic.   Neck:  supple and no masses.  no carotid bruits.   Lungs:  normal respiratory effort, normal breath sounds, no crackles, and no wheezes.   Heart:  normal rate, regular rhythm, and no gallop.   Abdomen:  soft, non-tender, and no masses.   Extremities:  trace left pedal edema and trace right pedal edema.   Neurologic:  cranial nerves II-XII intact and gait normal.   Psych:  normally interactive, good eye contact, not anxious appearing, and not depressed appearing.     Impression & Recommendations:  Problem # 1:  CHEST PAIN (ICD-786.50) Pt experienced chest pain 6 wks.  Her children urged her to go to ER but she refused.  She describes arms felt tight.   EKG shows  Sinus brady at 47 bpm.  incomplete RBBB lower metoprolol dose.  continue ACE, and statin, and plavix arrange cardiology follow up  Orders: Cardiology Referral (Cardiology)  Problem # 2:  HYPERTENSION (ICD-401.9) stable.  Maintain current medication regimen.  Her updated medication list for this problem includes:    Metoprolol Succinate 25 Mg Xr24h-tab (Metoprolol succinate) .Marland Kitchen... 1/2 by mouth once daily    Quinapril Hcl 40 Mg Tabs (Quinapril hcl) ..... One by mouth qd    Amlodipine Besylate 10 Mg  Tabs (Amlodipine besylate) ..... One by mouth qd    Furosemide 20 Mg Tabs (Furosemide) .Marland Kitchen... 1 by mouth once daily  BP today: 120/50 Prior BP: 143/55 (04/29/2009)  Labs Reviewed: K+: 4.7 (07/17/2009) Creat: : 1.1 (07/17/2009)   Chol: 161 (07/17/2009)   HDL: 54.00 (07/17/2009)   LDL: 79 (07/17/2009)   TG: 138.0 (07/17/2009)  Problem # 3:  DIABETES MELLITUS, TYPE II (ICD-250.00) A1c improved.  unclear if pt checking her blood sugars regularly.  she denies hypoglycemia.   I question whether pt is able to take care of herself at home.  I suggested her son come to next OV and discuss assisted living vs SNF  Her updated medication list for this problem includes:    Metformin Hcl 500 Mg Xr24h-tab (Metformin hcl) ..... One by mouth three times a day    Aspirin 81 Mg Tbec (Aspirin) .Marland Kitchen... Take two at breakfast and two at lunch    Quinapril Hcl 40 Mg Tabs (Quinapril hcl) ..... One by mouth qd    Lantus Solostar 100 Unit/ml Soln (Insulin glargine) .Marland KitchenMarland KitchenMarland KitchenMarland Kitchen 10- 15 units once daily as directed  Labs Reviewed: Creat: 1.1 (07/17/2009)    Reviewed HgBA1c results: 6.5 (07/17/2009)  6.8 (04/24/2009)  Complete Medication List: 1)  Metformin Hcl 500 Mg Xr24h-tab (Metformin hcl) .... One by mouth three times a day 2)  Metoprolol Succinate 25 Mg Xr24h-tab (Metoprolol succinate) .... 1/2 by mouth once daily 3)  Plavix 75 Mg Tabs (Clopidogrel bisulfate) .... Take 1 tablet by mouth once a day 4)  Aspirin 81 Mg Tbec (Aspirin) .... Take two at breakfast and two at lunch 5)  Glucosamine Chondr 500 Complex Caps (Glucosamine-chondroit-vit c-mn) .... Take 1 tablet by mouth once a day 6)  Centrum Silver Tabs (Multiple vitamins-minerals) .... Take 1 tablet by mouth once a day 7)  Iron 325 (65 Fe) Mg Tabs (Ferrous sulfate) .... Take 1 tablet by mouth two times a day 8)  Aricept 10 Mg Tabs (Donepezil hcl) .... 1/2 tab by mouth once daily 9)  Vitamin B-12 2500 Mcg Subl (Cyanocobalamin) .... Take 1 tablet by mouth once a  day 10)  Crestor 20 Mg Tabs (Rosuvastatin calcium) .... Take 1 tablet by mouth once a day 11)  Namenda 10 Mg Tabs (Memantine hcl) .... One by mouth two times a day 12)  Fish Oil 1200 Mg Caps (Omega-3 fatty acids) .... 2 caps two times a day 13)  Pantoprazole Sodium 40 Mg Tbec (Pantoprazole sodium) .Marland Kitchen.. 1 each day 30 minutes before meal 14)  Quinapril Hcl 40 Mg Tabs (Quinapril hcl) .... One by mouth qd 15)  Amlodipine Besylate 10 Mg Tabs (Amlodipine besylate) .... One by mouth qd 16)  Vitamin D 1000 Unit Tabs (Cholecalciferol) .... Take 1 tablet by mouth once a  day 17)  Furosemide 20 Mg Tabs (Furosemide) .Marland Kitchen.. 1 by mouth once daily 18)  Lantus Solostar 100 Unit/ml Soln (Insulin glargine) .Marland Kitchen.. 10- 15 units once daily as directed 19)  Bd Pen Needle Short U/f 31g X 8 Mm Misc (Insulin pen needle) .... Use once daily  Patient Instructions: 1)  Please schedule a follow-up appointment in 1 month. 2)  Please ask your son to come to next office visit Prescriptions: METOPROLOL SUCCINATE 25 MG XR24H-TAB (METOPROLOL SUCCINATE) 1/2 by mouth once daily  #30 x 5   Entered and Authorized by:   D. Drema Pry DO   Signed by:   D. Drema Pry DO on 07/24/2009   Method used:   Electronically to        [id number] (retail)       [address] [place], [place]  [address]       Ph: [phone]       Fax: [phone]   RxID:   [id number]   Current Allergies (reviewed today): No known allergies   Appended Document: Orders Update    Clinical Lists Changes  Orders: Added new Service order of EKG w/ Interpretation (93000) - Signed

## 2010-04-15 NOTE — Progress Notes (Signed)
Summary: u/s result  Phone Note Outgoing Call   Summary of Call: call pt - no acute findings seen on kidney ultrasound Initial call taken by: D. Drema Pry DO,  November 27, 2009 9:06 PM  Follow-up for Phone Call        Pt notified. Gilmore Laroche Fergerson CMA ([place])  November 28, 2009 10:00 AM

## 2010-04-15 NOTE — Miscellaneous (Signed)
Summary: Eye Exam  Clinical Lists Changes  Observations: Added new observation of DMEYEEXAMNXT: 08/2010 (08/07/2009 14:19) Added new observation of DMEYEEXMRES: normal (08/07/2009 14:19) Added new observation of EYE EXAM BY: Jerome (08/07/2009 14:19) Added new observation of DIAB EYE EX: normal (08/07/2009 14:19)       Diabetes Management Exam:    Eye Exam:       Eye Exam done elsewhere          Date: 08/07/2009          Results: normal          Done by: [place]

## 2010-04-15 NOTE — Progress Notes (Signed)
Summary: Furosemide Refill  Phone Note Refill Request Message from:  Fax from Pharmacy on Aug 02, 2009 12:46 PM  Refills Requested: Medication #1:  FUROSEMIDE 20 MG TABS 1 by mouth once daily   Dosage confirmed as above?Dosage Confirmed   Brand Name Necessary? No   Supply Requested: 1 month   Last Refilled: 06/25/2009  Method Requested: Electronic Next Appointment Scheduled: 08/26/2009 2 10:15 A Initial call taken by: Jiles Garter CMA,  Aug 02, 2009 12:46 PM    Prescriptions: FUROSEMIDE 20 MG TABS (FUROSEMIDE) 1 by mouth once daily  #30 x 3   Entered by:   Jiles Garter CMA   Authorized by:   D. Drema Pry DO   Signed by:   Jiles Garter CMA on 08/02/2009   Method used:   Electronically to        [id number] (retail)       [address]       [place], [place]  [address]       Ph: [phone]       Fax: [phone]   RxID:   [id number]

## 2010-04-15 NOTE — Assessment & Plan Note (Signed)
Summary: NPV   Visit Type:  Initial Consult Primary Provider:  Jennings Books, DO  CC:  PV evaluation per Dr. Johnsie Cancel- Claudication- Legs cramps at night.  History of Present Illness: 77 year-old woman with bilateral lower extremity PAD presents today for follow-up evaluation. She has bilateral SFA, popliteal, and infrapopliteal disease. Main complaint is nocturnal calf cramps. She does not get much calf pain with walking, and is not very active. She used to walk more, but has cared for her husband for the past few years (he has recently passed).  She denies ulcerations on her feet. No true rest pain. No other complaints at present. ABI's in 2009 were about 0.4 bilaterally, and more recently they were up to 0.61 on the right and 0.68 on the left.  Current Medications (verified): 1)  Metformin Hcl 500 Mg Xr24h-Tab (Metformin Hcl) .... One By Mouth Three Times A Day 2)  Toprol Xl 50 Mg  Tb24 (Metoprolol Succinate) .... 1/2 Tablet By Mouth Once Daily 3)  Plavix 75 Mg  Tabs (Clopidogrel Bisulfate) .... Take 1 Tablet By Mouth Once A Day 4)  Aspirin 81 Mg Tbec (Aspirin) .... Take Two At Breakfast and Two At Lunch 5)  Glucosamine Chondr 500 Complex   Caps (Glucosamine-Chondroit-Vit C-Mn) .... Take 1 Tablet By Mouth Once A Day 6)  Centrum Silver   Tabs (Multiple Vitamins-Minerals) .... Take 1 Tablet By Mouth Once A Day 7)  Iron 325 (65 Fe) Mg  Tabs (Ferrous Sulfate) .... Take 1 Tablet By Mouth Two Times A Day 8)  Aricept 10 Mg  Tabs (Donepezil Hcl) .... 1/2 Tab By Mouth Once Daily 9)  Vitamin B-12 2500 Mcg Subl (Cyanocobalamin) .... Take 1 Tablet By Mouth Once A Day 10)  Crestor 20 Mg  Tabs (Rosuvastatin Calcium) .... Take 1 Tablet By Mouth Once A Day 11)  Namenda 10 Mg  Tabs (Memantine Hcl) .... One By Mouth Two Times A Day 12)  Fish Oil 1200 Mg  Caps (Omega-3 Fatty Acids) .... 2 Caps Two Times A Day 13)  Pantoprazole Sodium 40 Mg Tbec (Pantoprazole Sodium) .Marland Kitchen.. 1 Each Day 30 Minutes Before  Meal 14)  Quinapril Hcl 40 Mg Tabs (Quinapril Hcl) .... One By Mouth Qd 15)  Amlodipine Besylate 10 Mg Tabs (Amlodipine Besylate) .... One By Mouth Qd 16)  Vitamin D 1000 Unit Tabs (Cholecalciferol) .... Take 1 Tablet By Mouth Once A Day 17)  Furosemide 20 Mg Tabs (Furosemide) .Marland Kitchen.. 1 By Mouth Once Daily 18)  Lantus Solostar 100 Unit/ml Soln (Insulin Glargine) .... 5 -10  Units Once Daily As Directed 19)  Bd Pen Needle Short U/f 31g X 8 Mm Misc (Insulin Pen Needle) .... Use Once Daily 20)  Cefuroxime Axetil 500 Mg Tabs (Cefuroxime Axetil) .... One By Mouth Two Times A Day  Allergies (verified): No Known Drug Allergies  Past History:  Past medical history reviewed for relevance to current acute and chronic problems.  Past Medical History: Reviewed history from 03/28/2009 and no changes required. Current Problems:  MURMUR (ICD-785.2) PVD  CVA (ICD-434.91)   CORONARY ARTERY DISEASE (ICD-414.00) Distant CABG HYPERTENSION (ICD-401.9) HYPERNATREMIA (ICD-276.0) HYPERLIPIDEMIA (ICD-272.4) ? DIVERTICULITIS ON CT (ICD-793.4) DUODENAL ULCER, ACUTE, HEMORRHAGE (ICD-532.00) HYPERKALEMIA (ICD-276.7) DIARRHEA (ICD-787.91) ACHLORHYDRIA (ICD-536.0) ANEMIA, SECONDARY TO ACUTE BLOOD LOSS (ICD-285.1) WEAKNESS (ICD-780.79) CALLUS (ICD-700) AMAUROSIS FUGAX (ICD-362.34) UNSPECIFIED PERIPHERAL VASCULAR DISEASE (ICD-443.9) CAROTID BRUIT (ICD-785.9) DIABETES MELLITUS, TYPE II (ICD-250.00) DEMENTIA (ICD-294.8) DIABETIC NEUROPATHY  Vital Signs:  Patient profile:   77 year old female Height:  66 inches Weight:      153.50 pounds Pulse rate:   56 / minute Pulse rhythm:   regular Resp:     18 per minute BP sitting:   143 / 55  (left arm) Cuff size:   large  Vitals Entered By: Sidney Ace (April 29, 2009 3:13 PM)  Serial Vital Signs/Assessments:  Time      Position  BP       Pulse  Resp  Temp     By           R Arm     142/62                         Sidney Ace   Physical  Exam  General:  Pt is alert and oriented, in no acute distress. HEENT: normal Neck: normal carotid upstrokes with bilateral bruits, JVP normal Lungs: CTA CV: RRR without murmur or gallop Abd: soft, NT, positive BS, no bruit, no organomegaly Ext: no clubbing, cyanosis, or edema. popliteal pulse 2+=, DP pulse 1+= bilateral Skin: warm and dry without rash    Impression & Recommendations:  Problem # 1:  PVD (ICD-443.9) Stable with bilateral popliteal and tibial disease. She is asymptomatic at present, but probably would have symptoms if she were more active. Encouraged regular walking and ongoing efforts at risk reduction. No further eval required at present and she has no evidence of limb ischemia.  Pt is on appropriate Rx with ASA, statin, beta blocker, ACE  Patient Instructions: 1)  Your physician recommends that you schedule a follow-up appointment as needed. 2)  Your physician recommends that you continue on your current medications as directed. Please refer to the Current Medication list given to you today.

## 2010-04-15 NOTE — Progress Notes (Signed)
Summary: Nuclear Pre-Procedure  Phone Note Outgoing Call   Call placed by: Perrin Maltese, EMT-P,  Jul 29, 2009 3:36 PM Summary of Call: Reviewed information on [facility] (see scanned document for further details).  Spoke with patient.     Nuclear Med Background Indications for Stress Test: Evaluation for Ischemia, Graft Patency   History: Abnormal EKG, CABG, Myocardial Perfusion Study  History Comments: 12/24/08 Hyperkalemia 08/08 MPS EF71% <Activity within the Anterior wall '99 CABG x4  Symptoms: Fatigue, Palpitations    Nuclear Pre-Procedure Cardiac Risk Factors: Carotid Disease, CVA, Family History - CAD, History of Smoking, Hypertension, Lipids, NIDDM, PVD, RBBB, TIA Height (in): 83  Nuclear Med Study Referring MD:  P.[place]

## 2010-04-15 NOTE — Assessment & Plan Note (Signed)
Summary: F/U ABI/CAROTID   Visit Type:  Follow-up Primary Provider:  Jennings Books, DO  CC:  No complaints.  History of Present Illness: 77 year-old woman with bilateral lower extremity PAD presents today for follow-up evaluation. She has bilateral SFA, popliteal, and infrapopliteal disease. Main complaint is nocturnal calf cramps. She has episodic bilateral calf pain with walking, but is not very active. She is not particularly limited by claudication symptoms. Denies rest pain or ulceration. No chest pain.  Current Medications (verified): 1)  Metoprolol Succinate 50 Mg Xr24h-Tab (Metoprolol Succinate) .... 1/2 By Mouth Once Daily 2)  Plavix 75 Mg  Tabs (Clopidogrel Bisulfate) .... Take 1 Tablet By Mouth Once A Day 3)  Aspirin 81 Mg Tbec (Aspirin) .... Take Two At Breakfast and Two At Lunch 4)  Glucosamine Chondr 500 Complex   Caps (Glucosamine-Chondroit-Vit C-Mn) .... Take 1 Tablet By Mouth Once A Day 5)  Centrum Silver   Tabs (Multiple Vitamins-Minerals) .... Take 1 Tablet By Mouth Once A Day 6)  Iron 325 (65 Fe) Mg  Tabs (Ferrous Sulfate) .... Take 1 Tablet By Mouth Two Times A Day 7)  Aricept 10 Mg  Tabs (Donepezil Hcl) .... 1/2 Tab By Mouth Once Daily 8)  Vitamin B-12 2500 Mcg Subl (Cyanocobalamin) .... Take 1 Tablet By Mouth Once A Day 9)  Crestor 20 Mg  Tabs (Rosuvastatin Calcium) .... Take 1 Tablet By Mouth Once A Day 10)  Namenda 10 Mg  Tabs (Memantine Hcl) .... One By Mouth Two Times A Day 11)  Fish Oil 1200 Mg  Caps (Omega-3 Fatty Acids) .... 2 Caps Two Times A Day 12)  Pantoprazole Sodium 40 Mg Tbec (Pantoprazole Sodium) .Marland Kitchen.. 1 Each Day 30 Minutes Before Meal 13)  Quinapril Hcl 40 Mg Tabs (Quinapril Hcl) .... One By Mouth Qd 14)  Amlodipine Besylate 10 Mg Tabs (Amlodipine Besylate) .... One By Mouth Qd 15)  Vitamin D 1000 Unit Tabs (Cholecalciferol) .... Take 1 Tablet By Mouth Once A Day 16)  Furosemide 20 Mg Tabs (Furosemide) .Marland Kitchen.. 1 By Mouth Once Daily 17)  Lantus Solostar  100 Unit/ml Soln (Insulin Glargine) .Marland Kitchen.. 10- 15 Units Once Daily As Directed 18)  Bd Pen Needle Short U/f 31g X 8 Mm Misc (Insulin Pen Needle) .... Use Once Daily 19)  Isosorbide Mononitrate Cr 30 Mg Xr24h-Tab (Isosorbide Mononitrate) .... Take One Tablet By Mouth Daily 20)  Nitrostat 0.4 Mg Subl (Nitroglycerin) .Marland Kitchen.. 1 Tablet Under Tongue At Onset of Chest Pain; You May Repeat Every 5 Minutes For Up To 3 Doses.  Allergies (verified): No Known Drug Allergies  Past History:  Past medical history reviewed for relevance to current acute and chronic problems.  Past Medical History: Reviewed history from 11/27/2009 and no changes required. Current Problems:  MURMUR (ICD-785.2) PVD   CVA (ICD-434.91)     CORONARY ARTERY DISEASE (ICD-414.00) Distant CABG HYPERTENSION (ICD-401.9) HYPERNATREMIA (ICD-276.0) HYPERLIPIDEMIA (ICD-272.4) ? DIVERTICULITIS ON CT (ICD-793.4)  DUODENAL ULCER, ACUTE, HEMORRHAGE (ICD-532.00)  HYPERKALEMIA (ICD-276.7) DIARRHEA (ICD-787.91) ACHLORHYDRIA (ICD-536.0) ANEMIA, SECONDARY TO ACUTE BLOOD LOSS (ICD-285.1) WEAKNESS (ICD-780.79) CALLUS (ICD-700) AMAUROSIS FUGAX (ICD-362.34) UNSPECIFIED PERIPHERAL VASCULAR DISEASE (ICD-443.9) CAROTID BRUIT (ICD-785.9) DIABETES MELLITUS, TYPE II (ICD-250.00) DEMENTIA (ICD-294.8) DIABETIC NEUROPATHY  Vital Signs:  Patient profile:   77 year old female Height:      66 inches Weight:      160.50 pounds BMI:     26.00 Pulse rate:   56 / minute Pulse rhythm:   regular Resp:     18 per  minute BP sitting:   142 / 78  (left arm) Cuff size:   large  Vitals Entered By: Sidney Ace (December 19, 2009 1:25 PM)  Serial Vital Signs/Assessments:  Time      Position  BP       Pulse  Resp  Temp     By           R Arm     142/80                         Sidney Ace   Physical Exam  General:  Pt is alert and oriented, in no acute distress. HEENT: normal Neck: normal carotid upstrokes without bruits, JVP normal Lungs:  CTA CV: RRR without murmur or gallop Abd: soft, NT, positive BS, no bruit, no organomegaly Ext: no clubbing, cyanosis, or edema. DP pulses 1+= bilaterally Skin: warm and dry without rash    ABI's  Procedure date:  12/19/2009  Findings:      ABI 0.55 right and 0.7 left  Impression & Recommendations:  Problem # 1:  PVD (ICD-443.9) Pt stable with mild intermittent claudication. Symptoms are limited because of inactivity. Recommend continue medical therapy as ABI's are stable over time and symptoms have not progressed.  Patient Instructions: 1)  Your physician recommends that you continue on your current medications as directed. Please refer to the Current Medication list given to you today. 2)  Your physician wants you to follow-up in:  1 YEAR.  You will receive a reminder letter in the mail two months in advance. If you don't receive a letter, please call our office to schedule the follow-up appointment.

## 2010-04-15 NOTE — Assessment & Plan Note (Signed)
Summary: F3M/DM   Primary Provider:  Jennings Books, DO  CC:  pt complains of stress family problems.  History of Present Illness: Destiny [name] is a vasculopath with distant history of CABG and known PVD.  Destiny [name] was cathed in 10/10 and then F/U with the PA.  I  last saw her in [place] .  Cath showed 3/4 patent vein grafts and normal LV funciton.  Medical Rx recommended by Dr Dinah Beers.  Destiny [name] had diffuse PVD with a tight distal RSFA and tight left popliteal.  Most recent ABI;s were in the .6 range.  Destiny [name] saw Dr Burt Knack in 2009 and had ABI's in the .4 range bilaterally.  Destiny [name] also has bilateral 40-59% carotid disease and needs F/U duplex this year.  Destiny [name] denies SSCP but does have somewhat limiting claudication and cramps at night that Destiny [name] says mustard helps.  Destiny [name] has been started on insulin by Dr Shawna Orleans.  Destiny [name] had a myovue study that I reviewed It showed fairly significant anteroapical ischemia.  I then reviewed her cath from 10/10.  Her lima was patent to the LAD and I did not see a lot of retrograde filling of the diagonal whose SVG had been occluded.  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  I questioned her closley and Destiny [name] is not having SSCP, pressure or marked dyspnea.  Her activity is more limited by claudication.  After lengthy discussion we decided to continue medical Rx and Imdur was added during our last visit .  Destiny [name] has not had any SSCP with medical Rx and continues to want to avoid further cath. Destiny [name] has stable claudication and no TIA's  Destiny [name] has been under a lot of stress.  Her husband recently passed and her daughter tried to commit suicide.  This is an ongoing problem related to her inability to take medication for epilepsy  Current Problems (verified): 1)  Chest Pain  (ICD-786.50) 2)  Uti  (ICD-599.0) 3)  Pvd  (ICD-443.9) 4)  Diabetic Peripheral Neuropathy  (ICD-250.60) 5)  Murmur  (ICD-785.2) 6)  Cva  (ICD-434.91) 7)  Coronary Artery Disease   (ICD-414.00) 8)  Hypertension  (ICD-401.9) 9)  Hypernatremia  (ICD-276.0) 10)  Hyperlipidemia  (ICD-272.4) 11)  ? Diverticulitis On Ct  (ICD-793.4) 12)  Duodenal Ulcer, Acute, Hemorrhage  (ICD-532.00) 13)  Hyperkalemia  (ICD-276.7) 14)  Diarrhea  (ICD-787.91) 15)  Achlorhydria  (ICD-536.0) 16)  Anemia, Secondary To Acute Blood Loss  (ICD-285.1) 17)  Weakness  (ICD-780.79) 18)  Callus  (ICD-700) 19)  Amaurosis Fugax  (ICD-362.34) 20)  Unspecified Peripheral Vascular Disease  (ICD-443.9) 21)  Carotid Bruit  (ICD-785.9) 22)  Diabetes Mellitus, Type II  (ICD-250.00) 23)  Dementia  (ICD-294.8)  Current Medications (verified): 1)  Metformin Hcl 500 Mg Xr24h-Tab (Metformin Hcl) .... One By Mouth Three Times A Day 2)  Metoprolol Succinate 25 Mg Xr24h-Tab (Metoprolol Succinate) .... 1/2 By Mouth Once Daily 3)  Plavix 75 Mg  Tabs (Clopidogrel Bisulfate) .... Take 1 Tablet By Mouth Once A Day 4)  Aspirin 81 Mg Tbec (Aspirin) .... Take Two At Breakfast and Two At Lunch 5)  Glucosamine Chondr 500 Complex   Caps (Glucosamine-Chondroit-Vit C-Mn) .... Take 1 Tablet By Mouth Once A Day 6)  Centrum Silver   Tabs (Multiple Vitamins-Minerals) .... Take 1 Tablet By Mouth Once A Day 7)  Iron 325 (65 Fe) Mg  Tabs (Ferrous Sulfate) .... Take 1 Tablet By Mouth Two Times A Day 8)  Aricept 10 Mg  Tabs (Donepezil Hcl) .... 1/2 Tab By Mouth Once Daily 9)  Vitamin B-12 2500 Mcg Subl (Cyanocobalamin) .... Take 1 Tablet By Mouth Once A Day 10)  Crestor 20 Mg  Tabs (Rosuvastatin Calcium) .... Take 1 Tablet By Mouth Once A Day 11)  Namenda 10 Mg  Tabs (Memantine Hcl) .... One By Mouth Two Times A Day 12)  Fish Oil 1200 Mg  Caps (Omega-3 Fatty Acids) .... 2 Caps Two Times A Day 13)  Pantoprazole Sodium 40 Mg Tbec (Pantoprazole Sodium) .Marland Kitchen.. 1 Each Day 30 Minutes Before Meal 14)  Quinapril Hcl 40 Mg Tabs (Quinapril Hcl) .... One By Mouth Qd 15)  Amlodipine Besylate 10 Mg Tabs (Amlodipine Besylate) .... One By Mouth  Qd 16)  Vitamin D 1000 Unit Tabs (Cholecalciferol) .... Take 1 Tablet By Mouth Once A Day 17)  Furosemide 20 Mg Tabs (Furosemide) .Marland Kitchen.. 1 By Mouth Once Daily 18)  Lantus Solostar 100 Unit/ml Soln (Insulin Glargine) .Marland Kitchen.. 10- 15 Units Once Daily As Directed 19)  Bd Pen Needle Short U/f 31g X 8 Mm Misc (Insulin Pen Needle) .... Use Once Daily 20)  Isosorbide Mononitrate Cr 30 Mg Xr24h-Tab (Isosorbide Mononitrate) .... Take One Tablet By Mouth Daily 21)  Nitrostat 0.4 Mg Subl (Nitroglycerin) .Marland Kitchen.. 1 Tablet Under Tongue At Onset of Chest Pain; You May Repeat Every 5 Minutes For Up To 3 Doses.  Allergies (verified): No Known Drug Allergies  Past History:  Past Medical History: Last updated: 08/26/2009 Current Problems:  MURMUR (ICD-785.2) PVD  CVA (ICD-434.91)     CORONARY ARTERY DISEASE (ICD-414.00) Distant CABG HYPERTENSION (ICD-401.9) HYPERNATREMIA (ICD-276.0) HYPERLIPIDEMIA (ICD-272.4) ? DIVERTICULITIS ON CT (ICD-793.4)  DUODENAL ULCER, ACUTE, HEMORRHAGE (ICD-532.00)  HYPERKALEMIA (ICD-276.7) DIARRHEA (ICD-787.91) ACHLORHYDRIA (ICD-536.0) ANEMIA, SECONDARY TO ACUTE BLOOD LOSS (ICD-285.1) WEAKNESS (ICD-780.79) CALLUS (ICD-700) AMAUROSIS FUGAX (ICD-362.34) UNSPECIFIED PERIPHERAL VASCULAR DISEASE (ICD-443.9) CAROTID BRUIT (ICD-785.9) DIABETES MELLITUS, TYPE II (ICD-250.00) DEMENTIA (ICD-294.8) DIABETIC NEUROPATHY  Past Surgical History: Last updated: 08/26/2009 Coronary artery bypass graft 1999        hysterectomy        Family History: Last updated: 08/26/2009 Family History of CAD Family History Hypertension Family History of Stroke   Husband has advanced dementia          Social History: Last updated: 08/26/2009 Married lives with daughter with epilepsy - mental retardation Retired   Former Smoker   Alcohol use-no       Occupation: Retired     son - [employer]   Review of Systems       Denies fever, malais, weight loss, blurry vision, decreased visual  acuity, cough, sputum, SOB, hemoptysis, pleuritic pain, palpitaitons, heartburn, abdominal pain, melena, lower extremity edema, , or rash.   Vital Signs:  Patient profile:   77 year old female Height:      66 inches Weight:      156 pounds BMI:     25.27 Pulse rate:   51 / minute Resp:     16 per minute BP sitting:   169 / 69  (left arm)  Vitals Entered By: Burnett Kanaris (November 11, 2009 12:07 PM)  Physical Exam  General:  Affect appropriate Healthy:  appears stated age 77: normal Neck supple with no adenopathy JVP normal left  bruits no thyromegaly Lungs clear with no wheezing and good diaphragmatic motion Heart:  S1/S2 no murmur,rub, gallop or click PMI normal Abdomen: benighn, BS positve, no tenderness, no AAA no bruit.  No HSM or HJR  Distal pulses intact with bilatera femoral  bruits No edema Neuro non-focal Skin warm and dry    Impression & Recommendations:  Problem # 1:  CHEST PAIN (ICD-786.50) Stable on medical Rx .   Her updated medication list for this problem includes:    Metoprolol Succinate 25 Mg Xr24h-tab (Metoprolol succinate) .Marland Kitchen... 1/2 by mouth once daily    Plavix 75 Mg Tabs (Clopidogrel bisulfate) .Marland Kitchen... Take 1 tablet by mouth once a day    Aspirin 81 Mg Tbec (Aspirin) .Marland Kitchen... Take two at breakfast and two at lunch    Quinapril Hcl 40 Mg Tabs (Quinapril hcl) ..... One by mouth qd    Amlodipine Besylate 10 Mg Tabs (Amlodipine besylate) ..... One by mouth qd    Isosorbide Mononitrate Cr 30 Mg Xr24h-tab (Isosorbide mononitrate) .Marland Kitchen... Take one tablet by mouth daily    Nitrostat 0.4 Mg Subl (Nitroglycerin) .Marland Kitchen... 1 tablet under tongue at onset of chest pain; you may repeat every 5 minutes for up to 3 doses.  Problem # 2:  PVD (ICD-443.9) Walking distance stable.  F/U ABI's and visit with Burt Knack in 2 months.    Problem # 3:  HYPERTENSION (ICD-401.9) Well controlled Her updated medication list for this problem includes:    Metoprolol Succinate 25 Mg  Xr24h-tab (Metoprolol succinate) .Marland Kitchen... 1/2 by mouth once daily    Aspirin 81 Mg Tbec (Aspirin) .Marland Kitchen... Take two at breakfast and two at lunch    Quinapril Hcl 40 Mg Tabs (Quinapril hcl) ..... One by mouth qd    Amlodipine Besylate 10 Mg Tabs (Amlodipine besylate) ..... One by mouth qd    Furosemide 20 Mg Tabs (Furosemide) .Marland Kitchen... 1 by mouth once daily  Problem # 4:  HYPERLIPIDEMIA (ICD-272.4) At goal continue statin Her updated medication list for this problem includes:    Crestor 20 Mg Tabs (Rosuvastatin calcium) .Marland Kitchen... Take 1 tablet by mouth once a day  CHOL: 161 (07/17/2009)   LDL: 79 (07/17/2009)   HDL: 54.00 (07/17/2009)   TG: 138.0 (07/17/2009)  Problem # 5:  CVA (ICD-434.91) F/U carotid duplex given vasculopathy.  Continue ASA and Plavix Her updated medication list for this problem includes:    Plavix 75 Mg Tabs (Clopidogrel bisulfate) .Marland Kitchen... Take 1 tablet by mouth once a day    Aspirin 81 Mg Tbec (Aspirin) .Marland Kitchen... Take two at breakfast and two at lunch  Other Orders: Carotid Duplex (Carotid Duplex) Arterial Duplex Lower Extremity (Arterial Duplex Low)  Patient Instructions: 1)  Your physician recommends that you schedule a follow-up appointment in: [place] 2)  [place] 3)  Your physician has requested that you have an ankle brachial index (ABI). During this test an ultrasound and blood pressure cuff are used to evaluate the arteries that supply the arms and legs with blood. Allow thirty minutes for this exam. There are no restrictions or special instructions. 4)  Your physician has requested that you have a carotid duplex. This test is an ultrasound of the carotid arteries in your neck. It looks at blood flow through these arteries that supply the brain with blood. Allow one hour for this exam. There are no restrictions or special instructions.

## 2010-04-15 NOTE — Letter (Signed)
Summary: Patient No Show/[facility]  Patient No Show/[facility]   Imported By: Edmonia James 06/13/2009 13:45:03  _____________________________________________________________________  External Attachment:    Type:   Image     Comment:   External Document

## 2010-04-15 NOTE — Progress Notes (Signed)
Summary: refill request  Phone Note Refill Request Message from:  Patient on May 09, 2009 12:11 PM  Refills Requested: Medication #1:  QUINAPRIL HCL 40 MG TABS one by mouth qd  Medication #2:  TOPROL XL 50 MG  TB24 1/2 tablet by mouth once daily  Medication #3:  ARICEPT 10 MG  TABS 1/2 tab by mouth once daily [facility] mail order [place] [phone] phone [phone]   Method Requested: Electronic Next Appointment Scheduled: 07-17-09 lab  Initial call taken by: Shanon Payor,  May 09, 2009 12:13 PM Caller: pt live Call For: Shawna Orleans  Summary of Call: patient is   Follow-up for Phone Call        Meds. refilled for 90 day supply. Pt. notified. Follow-up by: Kelle Darting CMA,  May 09, 2009 12:32 PM    Prescriptions: QUINAPRIL HCL 40 MG TABS (QUINAPRIL HCL) one by mouth qd  #90 x 0   Entered by:   Kelle Darting CMA   Authorized by:   D. Drema Pry DO   Signed by:   Kelle Darting CMA on 05/09/2009   Method used:   Telephoned to ...       [facility] [phone]* (retail)       [place], [place]  [address]       Ph: [phone]       Fax: [phone]   RxID:   [id number] ARICEPT 10 MG  TABS (DONEPEZIL HCL) 1/2 tab by mouth once daily  #45 x 0   Entered by:   Kelle Darting CMA   Authorized by:   D. Drema Pry DO   Signed by:   Kelle Darting CMA on 05/09/2009   Method used:   Telephoned to ...       [facility] [phone]* (retail)       [place], [place]  [address]       Ph: [phone]       Fax: [phone]   RxID:   [id number] TOPROL XL 50 MG  TB24 (METOPROLOL SUCCINATE) 1/2 tablet by mouth once daily  #45 x 0   Entered by:   Kelle Darting CMA   Authorized by:   D. Drema Pry DO   Signed by:   Kelle Darting CMA on 05/09/2009   Method used:   Telephoned to ...       [facility] [phone]* (retail)  [address] [place], [place]  [address]       Ph: [phone]       Fax: [phone]   RxID:   [id number]

## 2010-04-15 NOTE — Progress Notes (Signed)
Summary: refill--plavix  Phone Note Refill Request Message from:  Fax from Pharmacy on January 14, 2010 2:40 PM  Refills Requested: Medication #1:  PLAVIX 75 MG  TABS Take 1 tablet by mouth once a day   Dosage confirmed as above?Dosage Confirmed   Supply Requested: 3 months   Last Refilled: 05/21/2009 Next Appointment Scheduled: none--due in 03/2010 Initial call taken by: Kelle Darting CMA Deborra Medina),  January 14, 2010 2:41 PM    Prescriptions: PLAVIX 75 MG  TABS (CLOPIDOGREL BISULFATE) Take 1 tablet by mouth once a day  #90 x 1   Entered by:   Kelle Darting CMA ([place])   Authorized by:   D. Drema Pry DO   Signed by:   Kelle Darting CMA ([place]) on 01/14/2010   Method used:   Faxed to ...       [facility] (mail-order)             , [place]         Ph: [phone]       Fax: [phone]   RxID:   [id number]   Appended Document: refill--plavix Received fax request for Plavix from [facility]. Spoke to associate at [facility] and verfied they received rx from 01/14/10 and med was shipped on 01/16/10.

## 2010-04-15 NOTE — Assessment & Plan Note (Signed)
Summary: 1 month follow up/mhf--Rm3   Vital Signs:  Patient profile:   78 year old female Height:      66 inches Weight:      156.25 pounds BMI:     25.31 Temp:     97.6 degrees F oral Pulse rate:   60 / minute Pulse rhythm:   regular Resp:     16 per minute BP sitting:   112 / 60  (left arm) Cuff size:   regular  Vitals Entered By: Kelle Darting CMA (August 26, 2009 10:20 AM) Is Patient Diabetic? Yes   Primary Care Provider:  Jennings Books, DO   History of Present Illness: 77 y/o white female for follow up.  Pt seen by cardiology for chest pain.    cardiology notes reviewed Dr. Johnsie Cancel recommends medical therapy.  she was started on Imdur  DM II - stable.  she denies hypoglycemia    Allergies (verified): No Known Drug Allergies  Past History:  Past Medical History: Current Problems:  MURMUR (ICD-785.2) PVD  CVA (ICD-434.91)     CORONARY ARTERY DISEASE (ICD-414.00) Distant CABG HYPERTENSION (ICD-401.9) HYPERNATREMIA (ICD-276.0) HYPERLIPIDEMIA (ICD-272.4) ? DIVERTICULITIS ON CT (ICD-793.4)  DUODENAL ULCER, ACUTE, HEMORRHAGE (ICD-532.00)  HYPERKALEMIA (ICD-276.7) DIARRHEA (ICD-787.91) ACHLORHYDRIA (ICD-536.0) ANEMIA, SECONDARY TO ACUTE BLOOD LOSS (ICD-285.1) WEAKNESS (ICD-780.79) CALLUS (ICD-700) AMAUROSIS FUGAX (ICD-362.34) UNSPECIFIED PERIPHERAL VASCULAR DISEASE (ICD-443.9) CAROTID BRUIT (ICD-785.9) DIABETES MELLITUS, TYPE II (ICD-250.00) DEMENTIA (ICD-294.8) DIABETIC NEUROPATHY  Past Surgical History: Coronary artery bypass graft 1999        hysterectomy        Family History: Family History of CAD Family History Hypertension Family History of Stroke   Husband has advanced dementia          Social History: Married lives with daughter with epilepsy - mental retardation Retired   Former Smoker   Alcohol use-no       Occupation: Retired     son - Karnesha Sobolewski   Physical Exam  General:  alert, well-developed, and well-nourished.   Neck:   supple and no masses.  no carotid bruits.   Lungs:  normal respiratory effort, normal breath sounds, no crackles, and no wheezes.   Heart:  normal rate, regular rhythm, and no gallop.   Extremities:  trace left pedal edema and trace right pedal edema.   Neurologic:  cranial nerves II-XII intact and gait normal.   Psych:  normally interactive and good eye contact.     Impression & Recommendations:  Problem # 1:  DIABETES MELLITUS, TYPE II (ICD-250.00) Assessment Improved CBGs better since starting lantus.  Maintain current medication regimen.  Her updated medication list for this problem includes:    Metformin Hcl 500 Mg Xr24h-tab (Metformin hcl) ..... One by mouth three times a day    Aspirin 81 Mg Tbec (Aspirin) .Marland Kitchen... Take two at breakfast and two at lunch    Quinapril Hcl 40 Mg Tabs (Quinapril hcl) ..... One by mouth qd    Lantus Solostar 100 Unit/ml Soln (Insulin glargine) .Marland KitchenMarland KitchenMarland KitchenMarland Kitchen 10- 15 units once daily as directed  Labs Reviewed: Creat: 1.1 (07/17/2009)     Last Eye Exam: normal (08/07/2009) Reviewed HgBA1c results: 6.5 (07/17/2009)  6.8 (04/24/2009)  Problem # 2:  DEMENTIA (ICD-294.8) Pt at risk for multi infarct dementia.   Prev CT of head in 2009 showed -    IMPRESSION:    1.  No acute intracranial abnormality.  No traumatic injury   identified.   2.  Old scattered lacunar infarcts.  3.  Left frontal periventricular encephalomalacia and right   occipital encephalomalacia.  Pt and her son advised to consider planning for ICF.  they are considering purchasing smaller home that she and her daughter can better manage  Problem # 3:  HYPERTENSION (ICD-401.9) Assessment: Improved Maintain current medication regimen.  Her updated medication list for this problem includes:    Metoprolol Succinate 25 Mg Xr24h-tab (Metoprolol succinate) .Marland Kitchen... 1/2 by mouth once daily    Quinapril Hcl 40 Mg Tabs (Quinapril hcl) ..... One by mouth qd    Amlodipine Besylate 10 Mg Tabs (Amlodipine  besylate) ..... One by mouth qd    Furosemide 20 Mg Tabs (Furosemide) .Marland Kitchen... 1 by mouth once daily  BP today: 112/60 Prior BP: 120/64 (08/13/2009)  Labs Reviewed: K+: 4.7 (07/17/2009) Creat: : 1.1 (07/17/2009)   Chol: 161 (07/17/2009)   HDL: 54.00 (07/17/2009)   LDL: 79 (07/17/2009)   TG: 138.0 (07/17/2009)  Complete Medication List: 1)  Metformin Hcl 500 Mg Xr24h-tab (Metformin hcl) .... One by mouth three times a day 2)  Metoprolol Succinate 25 Mg Xr24h-tab (Metoprolol succinate) .... 1/2 by mouth once daily 3)  Plavix 75 Mg Tabs (Clopidogrel bisulfate) .... Take 1 tablet by mouth once a day 4)  Aspirin 81 Mg Tbec (Aspirin) .... Take two at breakfast and two at lunch 5)  Glucosamine Chondr 500 Complex Caps (Glucosamine-chondroit-vit c-mn) .... Take 1 tablet by mouth once a day 6)  Centrum Silver Tabs (Multiple vitamins-minerals) .... Take 1 tablet by mouth once a day 7)  Iron 325 (65 Fe) Mg Tabs (Ferrous sulfate) .... Take 1 tablet by mouth two times a day 8)  Aricept 10 Mg Tabs (Donepezil hcl) .... 1/2 tab by mouth once daily 9)  Vitamin B-12 2500 Mcg Subl (Cyanocobalamin) .... Take 1 tablet by mouth once a day 10)  Crestor 20 Mg Tabs (Rosuvastatin calcium) .... Take 1 tablet by mouth once a day 11)  Namenda 10 Mg Tabs (Memantine hcl) .... One by mouth two times a day 12)  Fish Oil 1200 Mg Caps (Omega-3 fatty acids) .... 2 caps two times a day 13)  Pantoprazole Sodium 40 Mg Tbec (Pantoprazole sodium) .Marland Kitchen.. 1 each day 30 minutes before meal 14)  Quinapril Hcl 40 Mg Tabs (Quinapril hcl) .... One by mouth qd 15)  Amlodipine Besylate 10 Mg Tabs (Amlodipine besylate) .... One by mouth qd 16)  Vitamin D 1000 Unit Tabs (Cholecalciferol) .... Take 1 tablet by mouth once a day 17)  Furosemide 20 Mg Tabs (Furosemide) .Marland Kitchen.. 1 by mouth once daily 18)  Lantus Solostar 100 Unit/ml Soln (Insulin glargine) .Marland Kitchen.. 10- 15 units once daily as directed 19)  Bd Pen Needle Short U/f 31g X 8 Mm Misc (Insulin pen  needle) .... Use once daily 20)  Isosorbide Mononitrate Cr 30 Mg Xr24h-tab (Isosorbide mononitrate) .... Take one tablet by mouth daily 21)  Nitrostat 0.4 Mg Subl (Nitroglycerin) .Marland Kitchen.. 1 tablet under tongue at onset of chest pain; you may repeat every 5 minutes for up to 3 doses.  Patient Instructions: 1)  Please schedule a follow-up appointment in 3 months. 2)  BMP prior to visit, ICD-9:  250.00 3)  HbgA1C prior to visit, ICD-9:  250.00 4)  Please return for lab work one (1) week before your next appointment.   Current Allergies (reviewed today): No known allergies

## 2010-04-15 NOTE — Assessment & Plan Note (Signed)
Summary: 1 MONTH FOLLOW UP/MHF   Vital Signs:  Patient profile:   77 year old female Height:      66 inches Weight:      161.50 pounds BMI:     26.16 O2 Sat:      98 % on Room air Temp:     97.9 degrees F oral Pulse rate:   48 / minute Pulse rhythm:   regular Resp:     20 per minute BP sitting:   142 / 72  (right arm) Cuff size:   regular  Vitals Entered By: Jiles Garter CMA (December 26, 2009 10:16 AM)  O2 Flow:  Room air CC: 1 month follow up , Type 2 diabetes mellitus follow-up Is Patient Diabetic? Yes Did you bring your meter with you today? No Pain Assessment Patient in pain? no        Primary Care Provider:  Jennings Books, DO  CC:  1 month follow up  and Type 2 diabetes mellitus follow-up.  History of Present Illness:   Type 2 Diabetes Mellitus Follow-Up      This is a 77 year old woman who presents for Type 2 diabetes mellitus follow-up.  The patient denies self managed hypoglycemia and hypoglycemia requiring help.  The patient denies the following symptoms: chest pain.  Since the last visit the patient reports not monitoring blood glucose.  hyperglycemia in evening.   she doesn't always remember to take her insulin.  dietary compliance is sporadic  htn - stable  Preventive Screening-Counseling & Management  Alcohol-Tobacco     Smoking Status: quit  Allergies (verified): No Known Drug Allergies  Past History:  Past Medical History: Current Problems:  MURMUR (ICD-785.2) PVD   CVA (ICD-434.91)     CORONARY ARTERY DISEASE (ICD-414.00) Distant CABG  HYPERTENSION (ICD-401.9) HYPERNATREMIA (ICD-276.0) HYPERLIPIDEMIA (ICD-272.4) ? DIVERTICULITIS ON CT (ICD-793.4)  DUODENAL ULCER, ACUTE, HEMORRHAGE (ICD-532.00)  HYPERKALEMIA (ICD-276.7) DIARRHEA (ICD-787.91) ACHLORHYDRIA (ICD-536.0) ANEMIA, SECONDARY TO ACUTE BLOOD LOSS (ICD-285.1) WEAKNESS (ICD-780.79) CALLUS (ICD-700) AMAUROSIS FUGAX (ICD-362.34) UNSPECIFIED PERIPHERAL VASCULAR DISEASE  (ICD-443.9) CAROTID BRUIT (ICD-785.9) DIABETES MELLITUS, TYPE II (ICD-250.00) DEMENTIA (ICD-294.8) DIABETIC NEUROPATHY  Past Surgical History: Coronary artery bypass graft 1999        hysterectomy          Family History: Family History of CAD Family History Hypertension Family History of Stroke   Husband had advanced dementia           Social History: Widowed - husband passed away in 05/18/2009 lives with daughter with epilepsy - mental retardation Retired   Former Smoker   Alcohol use-no        Occupation: Retired     son - Khushboo Nega   Physical Exam  General:  alert, well-developed, and well-nourished.   Lungs:  normal respiratory effort, normal breath sounds, no crackles, and no wheezes.   Heart:  normal rate, regular rhythm, and no gallop.   Extremities:  trace left pedal edema and trace right pedal edema.     Impression & Recommendations:  Problem # 1:  HYPERTENSION (ICD-401.9) Assessment Improved  Her updated medication list for this problem includes:    Metoprolol Succinate 50 Mg Xr24h-tab (Metoprolol succinate) .Marland Kitchen... 1/2 by mouth once daily    Quinapril Hcl 40 Mg Tabs (Quinapril hcl) ..... One by mouth qd    Amlodipine Besylate 10 Mg Tabs (Amlodipine besylate) ..... One by mouth qd    Furosemide 20 Mg Tabs (Furosemide) .Marland Kitchen... 1 by mouth once daily  BP today: 142/72 Prior BP: 180/76 (11/27/2009)  Labs Reviewed: K+: 4.1 (11/22/2009) Creat: : 1.5 (11/22/2009)   Chol: 161 (07/17/2009)   HDL: 54.00 (07/17/2009)   LDL: 79 (07/17/2009)   TG: 138.0 (07/17/2009)  Problem # 2:  RENAL INSUFFICIENCY (ICD-588.9) Assessment: Improved Cr is 1.23  Orders: T-Basic Metabolic Panel (99991111)  Problem # 3:  DIABETES MELLITUS, TYPE II (ICD-250.00) pt having hyperglycemia after dinner.  consider add short acting insulin  Her updated medication list for this problem includes:    Aspirin 81 Mg Tbec (Aspirin) .Marland Kitchen... Take two at breakfast and two at lunch    Quinapril  Hcl 40 Mg Tabs (Quinapril hcl) ..... One by mouth qd    Lantus Solostar 100 Unit/ml Soln (Insulin glargine) .Marland Kitchen... 25 -30 units once daily as directed  Labs Reviewed: Creat: 1.5 (11/22/2009)     Last Eye Exam: normal (08/07/2009) Reviewed HgBA1c results: 7.5 (11/22/2009)  6.5 (07/17/2009)  Complete Medication List: 1)  Metoprolol Succinate 50 Mg Xr24h-tab (Metoprolol succinate) .... 1/2 by mouth once daily 2)  Plavix 75 Mg Tabs (Clopidogrel bisulfate) .... Take 1 tablet by mouth once a day 3)  Aspirin 81 Mg Tbec (Aspirin) .... Take two at breakfast and two at lunch 4)  Glucosamine Chondr 500 Complex Caps (Glucosamine-chondroit-vit c-mn) .... Take 1 tablet by mouth once a day 5)  Centrum Silver Tabs (Multiple vitamins-minerals) .... Take 1 tablet by mouth once a day 6)  Iron 325 (65 Fe) Mg Tabs (Ferrous sulfate) .... Take 1 tablet by mouth two times a day 7)  Aricept 10 Mg Tabs (Donepezil hcl) .... 1/2 tab by mouth once daily 8)  Vitamin B-12 2500 Mcg Subl (Cyanocobalamin) .... Take 1 tablet by mouth once a day 9)  Crestor 20 Mg Tabs (Rosuvastatin calcium) .... Take 1 tablet by mouth once a day 10)  Namenda 10 Mg Tabs (Memantine hcl) .... One by mouth two times a day 11)  Fish Oil 1200 Mg Caps (Omega-3 fatty acids) .... 2 caps two times a day 12)  Pantoprazole Sodium 40 Mg Tbec (Pantoprazole sodium) .Marland Kitchen.. 1 each day 30 minutes before meal 13)  Quinapril Hcl 40 Mg Tabs (Quinapril hcl) .... One by mouth qd 14)  Amlodipine Besylate 10 Mg Tabs (Amlodipine besylate) .... One by mouth qd 15)  Vitamin D 1000 Unit Tabs (Cholecalciferol) .... Take 1 tablet by mouth once a day 16)  Furosemide 20 Mg Tabs (Furosemide) .Marland Kitchen.. 1 by mouth once daily 17)  Lantus Solostar 100 Unit/ml Soln (Insulin glargine) .... 25 -30 units once daily as directed 18)  Bd Pen Needle Short U/f 31g X 8 Mm Misc (Insulin pen needle) .... Use once daily 19)  Isosorbide Mononitrate Cr 30 Mg Xr24h-tab (Isosorbide mononitrate) ....  Take one tablet by mouth daily 20)  Nitrostat 0.4 Mg Subl (Nitroglycerin) .Marland Kitchen.. 1 tablet under tongue at onset of chest pain; you may repeat every 5 minutes for up to 3 doses.  Patient Instructions: 1)  Please schedule a follow-up appointment in 3 months. 2)  BMP prior to visit, ICD-9:  250.00 3)  HbgA1C prior to visit, ICD-9: 250.00 4)  Please return for lab work one (1) week before your next appointment.     Current Allergies (reviewed today): No known allergies

## 2010-04-15 NOTE — Progress Notes (Signed)
Summary: Medication Refill  Phone Note Refill Request Call back at Home Phone [phone] Message from:  Patient on April 17, 2009 3:41 PM   Medication #2:  ARICEPT 10 MG  TABS 1/2 tab by mouth once daily   Dosage confirmed as above?Dosage Confirmed   Brand Name Necessary? No   Supply Requested: 3 months  Medication #3:  NAMENDA 10 MG  TABS one by mouth two times a day   Dosage confirmed as above?Dosage Confirmed   Brand Name Necessary? No   Supply Requested: 3 months  Medication #4:  AMLODIPINE BESYLATE 10 MG TABS one by mouth qd   Dosage confirmed as above?Dosage Confirmed   Brand Name Necessary? No   Supply Requested: 3 months [facility]  mail order [address] [place] [address]  please call in 30 day to [facility] on [address] as well    Method Requested: Electronic Next Appointment Scheduled: 04-24-2009 cardiology  Initial call taken by: Shanon Payor,  April 17, 2009 3:43 PM  Follow-up for Phone Call        patient is requesting a 30 day supply to local pharmacy and 57 for mailorder. She was informed rx  for mail order would be mailed to her home. Follow-up by: Jiles Garter CMA,  April 18, 2009 12:10 PM  Additional Follow-up for Phone Call Additional follow up Details #1::        ok for above rx Additional Follow-up by: D. Drema Pry DO,  April 18, 2009 1:24 PM    Additional Follow-up for Phone Call Additional follow up Details #2::    Rx's have been mailed to patient  Follow-up by: Jiles Garter CMA,  April 22, 2009 1:21 PM  Prescriptions: AMLODIPINE BESYLATE 10 MG TABS (AMLODIPINE BESYLATE) one by mouth qd  #90 x 3   Entered by:   Jiles Garter CMA   Authorized by:   D. Drema Pry DO   Signed by:   D. Drema Pry DO on 04/18/2009   Method used:   Print then Mail to Patient   RxID:   [id number] NAMENDA 10 MG  TABS (MEMANTINE HCL) one by mouth two times a day  #180 x 3   Entered by:   Jiles Garter CMA   Authorized by:    D. Drema Pry DO   Signed by:   D. Drema Pry DO on 04/18/2009   Method used:   Print then Mail to Patient   RxID:   [id number] ARICEPT 10 MG  TABS (DONEPEZIL HCL) 1/2 tab by mouth once daily  #90 x 3   Entered by:   Jiles Garter CMA   Authorized by:   D. Drema Pry DO   Signed by:   D. Drema Pry DO on 04/18/2009   Method used:   Print then Mail to Patient   RxID:   [id number] ARICEPT 10 MG  TABS (DONEPEZIL HCL) 1/2 tab by mouth once daily  #30 x 0   Entered by:   Jiles Garter CMA   Authorized by:   D. Drema Pry DO   Signed by:   D. Drema Pry DO on 04/18/2009   Method used:   Electronically to        [id number]* (retail)       [address] [place], [place]  [address]  Ph: [phone]       Fax: [phone]   [id number] NAMENDA 10 MG  TABS (MEMANTINE HCL) one by mouth two times a day  #60 x 0   Entered by:   Jiles Garter CMA   Authorized by:   D. Drema Pry DO   Signed by:   D. Drema Pry DO on 04/18/2009   Method used:   Electronically to        [facility] (retail)       [address] [place], [place]  [address]       Ph: [phone]       Fax: [phone]   RxID:   [id number] AMLODIPINE BESYLATE 10 MG TABS (AMLODIPINE BESYLATE) one by mouth qd  #30 x 0   Entered by:   Jiles Garter CMA   Authorized by:   D. Drema Pry DO   Signed by:   D. Drema Pry DO on 04/18/2009   Method used:   Electronically to        [facility] (retail)       [address] [place], [place]  [address]       Ph: [phone]       Fax: [phone]   RxID:   [id number]

## 2010-04-15 NOTE — Assessment & Plan Note (Signed)
Summary: 6 WEEK FOLLOW UP/MHF, resched- jr   Vital Signs:  Patient profile:   77 year old female Weight:      155 pounds BMI:     25.11 O2 Sat:      100 % on Room air Temp:     97.4 degrees F oral Pulse rate:   47 / minute Pulse rhythm:   regular Resp:     16 per minute BP sitting:   160 / 70  (right arm) Cuff size:   regular  Vitals Entered By: Jiles Garter CMA (March 28, 2009 3:30 PM)  O2 Flow:  Room air  Primary Care Provider:  D. Drema Pry, DO  CC:  6 Week Follow up  and Type 2 diabetes mellitus follow-up.  History of Present Illness: 6 week Follow up   Type 2 Diabetes Mellitus Follow-Up      This is a 77 year old woman who presents for Type 2 diabetes mellitus follow-up.  The patient denies self managed hypoglycemia and hypoglycemia requiring help.  The patient denies the following symptoms: chest pain.  Since the last visit the patient reports poor dietary compliance and monitoring blood glucose.  low blood sugar 145 highest 400+ , avg 260-270,  blood sugar is elevated during the evening hoursblood sugar today 145.  dietary compliance worse over holidays  evaluated by foot Dr yesterday , she has an infection in "long" toe next to her big toe she had her to nails trimmed. She is scheduled for follow up in 2 weeks    Allergies (verified): No Known Drug Allergies  Past History:  Past Medical History: Current Problems:  MURMUR (ICD-785.2) PVD  CVA (ICD-434.91)   CORONARY ARTERY DISEASE (ICD-414.00) Distant CABG HYPERTENSION (ICD-401.9) HYPERNATREMIA (ICD-276.0) HYPERLIPIDEMIA (ICD-272.4) ? DIVERTICULITIS ON CT (ICD-793.4) DUODENAL ULCER, ACUTE, HEMORRHAGE (ICD-532.00) HYPERKALEMIA (ICD-276.7) DIARRHEA (ICD-787.91) ACHLORHYDRIA (ICD-536.0) ANEMIA, SECONDARY TO ACUTE BLOOD LOSS (ICD-285.1) WEAKNESS (ICD-780.79) CALLUS (ICD-700) AMAUROSIS FUGAX (ICD-362.34) UNSPECIFIED PERIPHERAL VASCULAR DISEASE (ICD-443.9) CAROTID BRUIT (ICD-785.9) DIABETES MELLITUS,  TYPE II (ICD-250.00) DEMENTIA (ICD-294.8) DIABETIC NEUROPATHY  Past Surgical History: Coronary artery bypass graft 1999        hysterectomy  v   Family History: Family History of CAD Family History Hypertension Family History of Stroke   Husband has advanced dementia      Social History: Married Daughter with epilepsy Retired  Former Smoker  Alcohol use-no       Occupation: Retired       Physical Exam  General:  alert, well-developed, and well-nourished.   Lungs:  normal respiratory effort and normal breath sounds.   Heart:  Regular rate and rhythm; no murmurs, rubs,  or bruits. Extremities:  trace left pedal edema and trace right pedal edema.   Neurologic:  cranial nerves II-XII intact and gait normal.   Psych:  normally interactive and good eye contact.     Impression & Recommendations:  Problem # 1:  DIABETES MELLITUS, TYPE II (ICD-250.00) Home blood sugars much higher over last 1 month.  daughter does most of cooking.  I suggest refresher on nutrition counseling.  start low dose basal insulin.  stress importance of avoiding hypoglycemia.    Her updated medication list for this problem includes:    Metformin Hcl 500 Mg Xr24h-tab (Metformin hcl) ..... One by mouth three times a day    Aspirin 81 Mg Tbec (Aspirin) .Marland Kitchen... Take two at breakfast and two at lunch    Quinapril Hcl 40 Mg Tabs (Quinapril hcl) ..... One by mouth  qd    Lantus Solostar 100 Unit/ml Soln (Insulin glargine) .Marland KitchenMarland KitchenMarland KitchenMarland Kitchen 10-15 units once daily  Orders: Nutrition Referral (Nutrition)  Labs Reviewed: Creat: 1.1 (03/25/2009)    Reviewed HgBA1c results: 6.2 (01/28/2009)  5.9 (12/24/2008)  Problem # 2:  HYPERTENSION (ICD-401.9) take full dose of lasix.    Her updated medication list for this problem includes:    Toprol Xl 50 Mg Tb24 (Metoprolol succinate) .Marland Kitchen... 1/2 tablet by mouth once daily    Quinapril Hcl 40 Mg Tabs (Quinapril hcl) ..... One by mouth qd    Amlodipine Besylate 10 Mg Tabs (Amlodipine  besylate) ..... One by mouth qd    Furosemide 20 Mg Tabs (Furosemide) .Marland Kitchen... 1 by mouth once daily  BP today: 160/70 Prior BP: 148/60 (01/28/2009)  Labs Reviewed: K+: 4.3 (03/25/2009) Creat: : 1.1 (03/25/2009)   Chol: 160 (12/24/2008)   HDL: 52.70 (12/24/2008)   LDL: 84 (12/24/2008)   TG: 118.0 (12/24/2008)  Complete Medication List: 1)  Metformin Hcl 500 Mg Xr24h-tab (Metformin hcl) .... One by mouth three times a day 2)  Toprol Xl 50 Mg Tb24 (Metoprolol succinate) .... 1/2 tablet by mouth once daily 3)  Plavix 75 Mg Tabs (Clopidogrel bisulfate) .... Take 1 tablet by mouth once a day 4)  Aspirin 81 Mg Tbec (Aspirin) .... Take two at breakfast and two at lunch 5)  Glucosamine Chondr 500 Complex Caps (Glucosamine-chondroit-vit c-mn) .... Take 1 tablet by mouth once a day 6)  Centrum Silver Tabs (Multiple vitamins-minerals) .... Take 1 tablet by mouth once a day 7)  Iron 325 (65 Fe) Mg Tabs (Ferrous sulfate) .... Take 1 tablet by mouth two times a day 8)  Aricept 10 Mg Tabs (Donepezil hcl) .... 1/2 tab by mouth once daily 9)  Vitamin B-12 2500 Mcg Subl (Cyanocobalamin) .... Take 1 tablet by mouth once a day 10)  Crestor 20 Mg Tabs (Rosuvastatin calcium) .... Take 1 tablet by mouth once a day 11)  Namenda 10 Mg Tabs (Memantine hcl) .... One by mouth two times a day 12)  Fish Oil 1200 Mg Caps (Omega-3 fatty acids) .... 2 caps two times a day 13)  Pantoprazole Sodium 40 Mg Tbec (Pantoprazole sodium) .Marland Kitchen.. 1 each day 30 minutes before meal 14)  Quinapril Hcl 40 Mg Tabs (Quinapril hcl) .... One by mouth qd 15)  Amlodipine Besylate 10 Mg Tabs (Amlodipine besylate) .... One by mouth qd 16)  Vitamin D 1000 Unit Tabs (Cholecalciferol) .... Take 1 tablet by mouth once a day 17)  Furosemide 20 Mg Tabs (Furosemide) .Marland Kitchen.. 1 by mouth once daily 18)  Lantus Solostar 100 Unit/ml Soln (Insulin glargine) .Marland Kitchen.. 10-15 units once daily 19)  Bd Pen Needle Short U/f 31g X 8 Mm Misc (Insulin pen needle) .... Use once  daily  Patient Instructions: 1)  Please schedule a follow-up appointment in 1 month. 2)  BMP prior to visit, ICD-9:  401.9 3)  HbgA1C prior to visit, ICD-9:  250.02 4)  Please return for lab work one (1) week before your next appointment.  5)  Use selson blue as directed Prescriptions: BD PEN NEEDLE SHORT U/F 31G X 8 MM MISC (INSULIN PEN NEEDLE) use once daily  #100 x 2   Entered and Authorized by:   D. Drema Pry DO   Signed by:   D. Drema Pry DO on 03/28/2009   Method used:   Electronically to        [facility]. [phone]* (retail)  [address]       [place], [place]  [address]       Ph: [phone] or [phone]       Fax: [phone]   RxID:   [id number] LANTUS SOLOSTAR 100 UNIT/ML SOLN (INSULIN GLARGINE) 10-15 units once daily  #1 x 2   Entered and Authorized by:   D. Drema Pry DO   Signed by:   D. Drema Pry DO on 03/28/2009   Method used:   Electronically to        [facility]. [id number]* (retail)       [address]       [place], [place]  [address]       Ph: [phone] or [phone]       Fax: [phone]   RxID:   [id number] FUROSEMIDE 20 MG TABS (FUROSEMIDE) 1 by mouth once daily  #30 x 3   Entered and Authorized by:   D. Drema Pry DO   Signed by:   D. Drema Pry DO on 03/28/2009   Method used:   Electronically to        [facility]. [id number]* (retail)       [address]       [place], [place]  [address]       Ph: [phone] or [phone]       Fax: [phone]   [id number]     Current Allergies (reviewed today): No known allergies

## 2010-04-15 NOTE — Progress Notes (Signed)
Summary: Plavix Refill  Phone Note Refill Request Message from:  Patient on May 21, 2009 8:59 AM  Refills Requested: Medication #1:  PLAVIX 75 MG  TABS Take 1 tablet by mouth once a day   Dosage confirmed as above?Dosage Confirmed Pt. is out of this med. and Call Patient at [phone] and let her know status on this .Marland Kitchen... Refill to [facility] [phone]  Initial call taken by: Otho Ket,  May 21, 2009 9:01 AM  Follow-up for Phone Call        call returned to patient she is requesting a 30 day supply to [facility] and would like the rx for [facility] mailed to her.   Rx mailed to patient  Follow-up by: Jiles Garter CMA,  May 21, 2009 9:15 AM    Prescriptions: PLAVIX 75 MG  TABS (CLOPIDOGREL BISULFATE) Take 1 tablet by mouth once a day  #90 x 2   Entered by:   Jiles Garter CMA   Authorized by:   D. Drema Pry DO   Signed by:   Jiles Garter CMA on 05/21/2009   Method used:   Print then Mail to Patient   RxID:   [id number] PLAVIX 75 MG  TABS (CLOPIDOGREL BISULFATE) Take 1 tablet by mouth once a day  #30 x 0   Entered by:   Jiles Garter CMA   Authorized by:   D. Drema Pry DO   Signed by:   Jiles Garter CMA on 05/21/2009   Method used:   Electronically to        [id number]* (retail)       [address] [place], [place]  [address]       Ph: [phone]       Fax: [phone]   RxID:   [id number]

## 2010-04-15 NOTE — Miscellaneous (Signed)
Summary: Orders Update  Clinical Lists Changes  Orders: Added new Test order of Carotid Duplex (Carotid Duplex) - Signed 

## 2010-04-15 NOTE — Assessment & Plan Note (Signed)
Summary: 1 mo. f/u - , feels bad, ? bladder infection-jr   Vital Signs:  Patient profile:   77 year old female Weight:      152.75 pounds BMI:     24.74 O2 Sat:      95 % on Room air Temp:     97.9 degrees F oral Pulse rate:   45 / minute Pulse rhythm:   irregular Resp:     18 per minute BP sitting:   120 / 50  (right arm) Cuff size:   large  Vitals Entered By: Jiles Garter CMA (April 26, 2009 11:31 AM)  O2 Flow:  Room air  Primary Care Provider:  D. Drema Pry, DO  CC:  1 Month follow up.  History of Present Illness: 1 Month Folow up  c/o lower right sided  back pain symptoms started yesterday urinary freq chills but no fever  DM II -no hypoglycemia.  usually 135 in AM  Allergies (verified): No Known Drug Allergies  Past History:  Past Medical History: Current Problems:  MURMUR (ICD-785.2) PVD  CVA (ICD-434.91)    CORONARY ARTERY DISEASE (ICD-414.00) Distant CABG HYPERTENSION (ICD-401.9) HYPERNATREMIA (ICD-276.0) HYPERLIPIDEMIA (ICD-272.4) ? DIVERTICULITIS ON CT (ICD-793.4) DUODENAL ULCER, ACUTE, HEMORRHAGE (ICD-532.00) HYPERKALEMIA (ICD-276.7) DIARRHEA (ICD-787.91) ACHLORHYDRIA (ICD-536.0) ANEMIA, SECONDARY TO ACUTE BLOOD LOSS (ICD-285.1) WEAKNESS (ICD-780.79) CALLUS (ICD-700) AMAUROSIS FUGAX (ICD-362.34) UNSPECIFIED PERIPHERAL VASCULAR DISEASE (ICD-443.9) CAROTID BRUIT (ICD-785.9) DIABETES MELLITUS, TYPE II (ICD-250.00) DEMENTIA (ICD-294.8) DIABETIC NEUROPATHY  Past Surgical History: Coronary artery bypass graft 1999        hysterectomy     Family History: Family History of CAD Family History Hypertension Family History of Stroke   Husband has advanced dementia       Social History: Married Daughter with epilepsy Retired   Former Smoker  Alcohol use-no       Occupation: Retired       Physical Exam  General:  alert, well-developed, and well-nourished.   Lungs:  normal respiratory effort and normal breath sounds.   Heart:   Regular rate and rhythm; no murmurs, rubs,  or bruits. Abdomen:  soft, non-tender, no masses, no guarding, and no rigidity.     Impression & Recommendations:  Problem # 1:  UTI (ICD-599.0) Take abx as directed.  Patient advised to call office if symptoms persist or worsen.  Her updated medication list for this problem includes:    Cefuroxime Axetil 500 Mg Tabs (Cefuroxime axetil) ..... One by mouth two times a day  Complete Medication List: 1)  Metformin Hcl 500 Mg Xr24h-tab (Metformin hcl) .... One by mouth three times a day 2)  Toprol Xl 50 Mg Tb24 (Metoprolol succinate) .... 1/2 tablet by mouth once daily 3)  Plavix 75 Mg Tabs (Clopidogrel bisulfate) .... Take 1 tablet by mouth once a day 4)  Aspirin 81 Mg Tbec (Aspirin) .... Take two at breakfast and two at lunch 5)  Glucosamine Chondr 500 Complex Caps (Glucosamine-chondroit-vit c-mn) .... Take 1 tablet by mouth once a day 6)  Centrum Silver Tabs (Multiple vitamins-minerals) .... Take 1 tablet by mouth once a day 7)  Iron 325 (65 Fe) Mg Tabs (Ferrous sulfate) .... Take 1 tablet by mouth two times a day 8)  Aricept 10 Mg Tabs (Donepezil hcl) .... 1/2 tab by mouth once daily 9)  Vitamin B-12 2500 Mcg Subl (Cyanocobalamin) .... Take 1 tablet by mouth once a day 10)  Crestor 20 Mg Tabs (Rosuvastatin calcium) .... Take 1 tablet by mouth once a day 11)  Namenda 10 Mg Tabs (Memantine hcl) .... One by mouth two times a day 12)  Fish Oil 1200 Mg Caps (Omega-3 fatty acids) .... 2 caps two times a day 13)  Pantoprazole Sodium 40 Mg Tbec (Pantoprazole sodium) .Marland Kitchen.. 1 each day 30 minutes before meal 14)  Quinapril Hcl 40 Mg Tabs (Quinapril hcl) .... One by mouth qd 15)  Amlodipine Besylate 10 Mg Tabs (Amlodipine besylate) .... One by mouth qd 16)  Vitamin D 1000 Unit Tabs (Cholecalciferol) .... Take 1 tablet by mouth once a day 17)  Furosemide 20 Mg Tabs (Furosemide) .Marland Kitchen.. 1 by mouth once daily 18)  Lantus Solostar 100 Unit/ml Soln (Insulin  glargine) .... 5 -10  units once daily as directed 19)  Bd Pen Needle Short U/f 31g X 8 Mm Misc (Insulin pen needle) .... Use once daily 20)  Cefuroxime Axetil 500 Mg Tabs (Cefuroxime axetil) .... One by mouth two times a day  Other Orders: UA Dipstick w/o Micro (manual) (81002) T-Culture, Urine [id number]) Specimen Handling (99000)  Patient Instructions: 1)  Take antibiotics as directed 2)  Call our office if your symptoms do not  improve or gets worse. 3)  If you start exercise program, reduce Lantus dose to 5 units. 4)  Call our office if you experience low blood sugars. 5)  Please schedule a follow-up appointment in 3 months. 6)  BMP prior to visit, ICD-9:  401.9 7)  HbgA1C prior to visit, ICD-9:  250.00 8)  FLP, AST, ALT prior to visit, ICD-9: 272.4 9)  Please return for lab work one (1) week before your next appointment.  Prescriptions: LANTUS SOLOSTAR 100 UNIT/ML SOLN (INSULIN GLARGINE) 5 -10  units once daily as directed  #1 month x 3   Entered and Authorized by:   D. Drema Pry DO   Signed by:   D. Drema Pry DO on 04/26/2009   Method used:   Electronically to        [facility] (retail)       [address] [place], [place]  [address]       Ph: [phone]       Fax: [phone]   RxID:   [id number] CEFUROXIME AXETIL 500 MG TABS (CEFUROXIME AXETIL) one by mouth two times a day  #14 x 0   Entered and Authorized by:   D. Drema Pry DO   Signed by:   D. Drema Pry DO on 04/26/2009   Method used:   Electronically to        [facility] (retail)       [address] [place], [place]  [address]       Ph: [phone]       Fax: [phone]   RxID:   [id number]   Current Allergies (reviewed today): No known allergies   Laboratory Results   Urine Tests    Routine Urinalysis   Color: straw Appearance: Hazy Glucose: negative   (Normal Range: Negative) Bilirubin:  negative   (Normal Range: Negative) Ketone: negative   (Normal Range: Negative) Spec. Gravity: 1.015   (Normal Range: 1.003-1.035) Blood: large   (Normal Range: Negative) pH: 5.0   (Normal Range: 5.0-8.0) Protein: >=300   (Normal Range: Negative) Urobilinogen: 0.2   (Normal Range: 0-1) Nitrite: negative   (Normal Range: Negative) Leukocyte  Esterace: large   (Normal Range: Negative)

## 2010-04-15 NOTE — Miscellaneous (Signed)
  Clinical Lists Changes  Orders: Added new Referral order of Nuclear Stress Test (Nuc Stress Test) - Signed

## 2010-04-15 NOTE — Assessment & Plan Note (Signed)
Summary: 2 month rov/sl   Primary Provider:  Jennings Books, DO   History of Present Illness: Destiny [name] is a vasculopath with distant history of CABG and known PVD.  She was cathed in 10/10 and then F/U with the PA.  I have not seen her in a while.  Cath showed 3/4 patent vein grafts and normal LV funciton.  Medical Rx recommended by Dr Dinah Beers.  She had diffuse PVD with a tight distal RSFA and tight left popliteal.  We dont have recent ABI;s but she saw Dr Burt Knack in 2009 and had ABI's in the .4 range bilaterally.  She also has bilateral 40-59% carotid disease and needs F/U duplex this year.  She denies SSCP but does have somewhat limiting claudication and cramps at night that she says mustard helps.  She has been started on insulin by Dr Shawna Orleans.    Current Problems (verified): 1)  Pvd  (ICD-443.9) 2)  Diabetic Peripheral Neuropathy  (ICD-250.60) 3)  Murmur  (ICD-785.2) 4)  Cva  (ICD-434.91) 5)  Coronary Artery Disease  (ICD-414.00) 6)  Hypertension  (ICD-401.9) 7)  Hypernatremia  (ICD-276.0) 8)  Hyperlipidemia  (ICD-272.4) 9)  ? Diverticulitis On Ct  (ICD-793.4) 10)  Duodenal Ulcer, Acute, Hemorrhage  (ICD-532.00) 11)  Hyperkalemia  (ICD-276.7) 12)  Diarrhea  (ICD-787.91) 13)  Achlorhydria  (ICD-536.0) 14)  Anemia, Secondary To Acute Blood Loss  (ICD-285.1) 15)  Weakness  (ICD-780.79) 16)  Callus  (ICD-700) 17)  Amaurosis Fugax  (ICD-362.34) 18)  Unspecified Peripheral Vascular Disease  (ICD-443.9) 19)  Carotid Bruit  (ICD-785.9) 20)  Diabetes Mellitus, Type II  (ICD-250.00) 21)  Dementia  (ICD-294.8)  Current Medications (verified): 1)  Metformin Hcl 500 Mg Xr24h-Tab (Metformin Hcl) .... One By Mouth Three Times A Day 2)  Toprol Xl 50 Mg  Tb24 (Metoprolol Succinate) .... 1/2 Tablet By Mouth Once Daily 3)  Plavix 75 Mg  Tabs (Clopidogrel Bisulfate) .... Take 1 Tablet By Mouth Once A Day 4)  Aspirin 81 Mg Tbec (Aspirin) .... Take Two At Breakfast and Two At Lunch 5)  Glucosamine  Chondr 500 Complex   Caps (Glucosamine-Chondroit-Vit C-Mn) .... Take 1 Tablet By Mouth Once A Day 6)  Centrum Silver   Tabs (Multiple Vitamins-Minerals) .... Take 1 Tablet By Mouth Once A Day 7)  Iron 325 (65 Fe) Mg  Tabs (Ferrous Sulfate) .... Take 1 Tablet By Mouth Two Times A Day 8)  Aricept 10 Mg  Tabs (Donepezil Hcl) .... 1/2 Tab By Mouth Once Daily 9)  Vitamin B-12 2500 Mcg Subl (Cyanocobalamin) .... Take 1 Tablet By Mouth Once A Day 10)  Crestor 20 Mg  Tabs (Rosuvastatin Calcium) .... Take 1 Tablet By Mouth Once A Day 11)  Namenda 10 Mg  Tabs (Memantine Hcl) .... One By Mouth Two Times A Day 12)  Fish Oil 1200 Mg  Caps (Omega-3 Fatty Acids) .... 2 Caps Two Times A Day 13)  Pantoprazole Sodium 40 Mg Tbec (Pantoprazole Sodium) .Marland Kitchen.. 1 Each Day 30 Minutes Before Meal 14)  Quinapril Hcl 40 Mg Tabs (Quinapril Hcl) .... One By Mouth Qd 15)  Amlodipine Besylate 10 Mg Tabs (Amlodipine Besylate) .... One By Mouth Qd 16)  Vitamin D 1000 Unit Tabs (Cholecalciferol) .... Take 1 Tablet By Mouth Once A Day 17)  Furosemide 20 Mg Tabs (Furosemide) .Marland Kitchen.. 1 By Mouth Once Daily 18)  Lantus Solostar 100 Unit/ml Soln (Insulin Glargine) .Marland Kitchen.. 10-15 Units Once Daily 19)  Bd Pen Needle Short U/f 31g X 8 Mm  Misc (Insulin Pen Needle) .... Use Once Daily  Allergies (verified): No Known Drug Allergies  Past History:  Past Medical History: Last updated: 03/28/2009 Current Problems:  MURMUR (ICD-785.2) PVD  CVA (ICD-434.91)   CORONARY ARTERY DISEASE (ICD-414.00) Distant CABG HYPERTENSION (ICD-401.9) HYPERNATREMIA (ICD-276.0) HYPERLIPIDEMIA (ICD-272.4) ? DIVERTICULITIS ON CT (ICD-793.4) DUODENAL ULCER, ACUTE, HEMORRHAGE (ICD-532.00) HYPERKALEMIA (ICD-276.7) DIARRHEA (ICD-787.91) ACHLORHYDRIA (ICD-536.0) ANEMIA, SECONDARY TO ACUTE BLOOD LOSS (ICD-285.1) WEAKNESS (ICD-780.79) CALLUS (ICD-700) AMAUROSIS FUGAX (ICD-362.34) UNSPECIFIED PERIPHERAL VASCULAR DISEASE (ICD-443.9) CAROTID BRUIT  (ICD-785.9) DIABETES MELLITUS, TYPE II (ICD-250.00) DEMENTIA (ICD-294.8) DIABETIC NEUROPATHY  Past Surgical History: Last updated: 03/28/2009 Coronary artery bypass graft 1999        hysterectomy  v   Family History: Last updated: 03/28/2009 Family History of CAD Family History Hypertension Family History of Stroke   Husband has advanced dementia      Social History: Last updated: 03/28/2009 Married Daughter with epilepsy Retired  Former Smoker  Alcohol use-no       Occupation: Retired       Review of Systems       Denies fever, malais, weight loss, blurry vision, decreased visual acuity, cough, sputum, SOB, hemoptysis, pleuritic pain, palpitaitons, heartburn, abdominal pain, melena, lower extremity edema, , or rash. All other systems reviewed and negative  Vital Signs:  Patient profile:   77 year old female Height:      66 inches Weight:      152 pounds BMI:     24.62 Pulse rate:   52 / minute Resp:     16 per minute BP sitting:   134 / 54  (left arm)  Vitals Entered By: Burnett Kanaris (April 15, 2009 1:52 PM)  Physical Exam  General:  Affect appropriate Healthy:  appears stated age 47: normal Neck supple with no adenopathy JVP normal left  bruits no thyromegaly Lungs clear with no wheezing and good diaphragmatic motion Heart:  S1/S2 no murmur,rub, gallop or click PMI normal Abdomen: benighn, BS positve, no tenderness, no AAA no bruit.  No HSM or HJR Femorals diff to palpate but no bruit.  PT and DP not palpable bilaterally. No edema Neuro non-focal Skin warm and dry    Impression & Recommendations:  Problem # 1:  PVD (ICD-443.9) F/U duplex and ABI';s.  F/U Burt Knack to see if PTA of SFA on right or popliteal on left would be helpful  Problem # 2:  CVA (ICD-434.91) F/U duplex in 6 months.  Known 40-59% bilateral disease Her updated medication list for this problem includes:    Plavix 75 Mg Tabs (Clopidogrel bisulfate) .Marland Kitchen... Take 1 tablet by  mouth once a day    Aspirin 81 Mg Tbec (Aspirin) .Marland Kitchen... Take two at breakfast and two at lunch  Problem # 3:  CORONARY ARTERY DISEASE (ICD-414.00) Recent cath medical Rx.  No angina Her updated medication list for this problem includes:    Toprol Xl 50 Mg Tb24 (Metoprolol succinate) .Marland Kitchen... 1/2 tablet by mouth once daily    Plavix 75 Mg Tabs (Clopidogrel bisulfate) .Marland Kitchen... Take 1 tablet by mouth once a day    Aspirin 81 Mg Tbec (Aspirin) .Marland Kitchen... Take two at breakfast and two at lunch    Quinapril Hcl 40 Mg Tabs (Quinapril hcl) ..... One by mouth qd    Amlodipine Besylate 10 Mg Tabs (Amlodipine besylate) ..... One by mouth qd  Problem # 4:  HYPERTENSION (ICD-401.9) Well controlled continue low sodium diet Her updated medication list for this problem includes:    Toprol Xl  50 Mg Tb24 (Metoprolol succinate) .Marland Kitchen... 1/2 tablet by mouth once daily    Aspirin 81 Mg Tbec (Aspirin) .Marland Kitchen... Take two at breakfast and two at lunch    Quinapril Hcl 40 Mg Tabs (Quinapril hcl) ..... One by mouth qd    Amlodipine Besylate 10 Mg Tabs (Amlodipine besylate) ..... One by mouth qd    Furosemide 20 Mg Tabs (Furosemide) .Marland Kitchen... 1 by mouth once daily  Problem # 5:  HYPERLIPIDEMIA (ICD-272.4) At target continue statin Her updated medication list for this problem includes:    Crestor 20 Mg Tabs (Rosuvastatin calcium) .Marland Kitchen... Take 1 tablet by mouth once a day  CHOL: 160 (12/24/2008)   LDL: 84 (12/24/2008)   HDL: 52.70 (12/24/2008)   TG: 118.0 (12/24/2008)  Other Orders: Misc. Referral (Misc. Ref) Arterial Duplex Lower Extremity (Arterial Duplex Low)  Patient Instructions: 1)  Your physician recommends that you schedule a follow-up appointment in: [place] 2)  Your physician recommends that you continue on your current medications as directed. Please refer to the Current Medication list given to you today. 3)  You have been referred to DR COOPER  4)  Your physician has requested that you have an ankle  brachial index (ABI). During this test an ultrasound and blood pressure cuff are used to evaluate the arteries that supply the arms and legs with blood. Allow thirty minutes for this exam. There are no restrictions or special instructions. 5)  Your physician has requested that you have a lower or upper extremity arterial duplex.  This test is an ultrasound of the arteries in the legs or arms.  It looks at arterial blood flow in the legs and arms.  Allow one hour for Lower and Upper Arterial scans. There are no restrictions or special instructions.

## 2010-04-15 NOTE — Assessment & Plan Note (Signed)
Summary: rov/discuss myoview/dm   Primary Provider:  Jennings Books, DO   History of Present Illness: Destiny [name] is a vasculopath with distant history of CABG and known PVD.  She was cathed in 10/10 and then F/U with the PA.  I have not seen her in a while.  Cath showed 3/4 patent vein grafts and normal LV funciton.  Medical Rx recommended by Dr Dinah Beers.  She had diffuse PVD with a tight distal RSFA and tight left popliteal.  Most recent ABI;s were in the .6 range.  she saw Dr Burt Knack in 2009 and had ABI's in the .4 range bilaterally.  She also has bilateral 40-59% carotid disease and needs F/U duplex this year.  She denies SSCP but does have somewhat limiting claudication and cramps at night that she says mustard helps.  She has been started on insulin by Dr Shawna Orleans.  She had a myovue study that I reviewed It showed fairly significant anteroapical ischemia.  I then reviewed her cath from 10/10.  Her lima was patent to the LAD and I did not see a lot of retrograde filling of the diagonal whose SVG had been occluded.  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  I questioned her closley and she is not having SSCP, pressure or marked dyspnea.  Her activity is more limited by claudication.  After lengthy discussion we decided to continue medical Rx and add Imdur.  She will call if she has any sscp or new symptoms.  Her anatomy was stable just 7 months ago by cath and with no symptoms it is hard to insist on cath.  Current Problems (verified): 1)  Chest Pain  (ICD-786.50) 2)  Uti  (ICD-599.0) 3)  Pvd  (ICD-443.9) 4)  Diabetic Peripheral Neuropathy  (ICD-250.60) 5)  Murmur  (ICD-785.2) 6)  Cva  (ICD-434.91) 7)  Coronary Artery Disease  (ICD-414.00) 8)  Hypertension  (ICD-401.9) 9)  Hypernatremia  (ICD-276.0) 10)  Hyperlipidemia  (ICD-272.4) 11)  ? Diverticulitis On Ct  (ICD-793.4) 12)  Duodenal Ulcer, Acute, Hemorrhage  (ICD-532.00) 13)  Hyperkalemia   (ICD-276.7) 14)  Diarrhea  (ICD-787.91) 15)  Achlorhydria  (ICD-536.0) 16)  Anemia, Secondary To Acute Blood Loss  (ICD-285.1) 17)  Weakness  (ICD-780.79) 18)  Callus  (ICD-700) 19)  Amaurosis Fugax  (ICD-362.34) 20)  Unspecified Peripheral Vascular Disease  (ICD-443.9) 21)  Carotid Bruit  (ICD-785.9) 22)  Diabetes Mellitus, Type II  (ICD-250.00) 23)  Dementia  (ICD-294.8)  Current Medications (verified): 1)  Metformin Hcl 500 Mg Xr24h-Tab (Metformin Hcl) .... One By Mouth Three Times A Day 2)  Metoprolol Succinate 25 Mg Xr24h-Tab (Metoprolol Succinate) .... 1/2 By Mouth Once Daily 3)  Plavix 75 Mg  Tabs (Clopidogrel Bisulfate) .... Take 1 Tablet By Mouth Once A Day 4)  Aspirin 81 Mg Tbec (Aspirin) .... Take Two At Breakfast and Two At Lunch 5)  Glucosamine Chondr 500 Complex   Caps (Glucosamine-Chondroit-Vit C-Mn) .... Take 1 Tablet By Mouth Once A Day 6)  Centrum Silver   Tabs (Multiple Vitamins-Minerals) .... Take 1 Tablet By Mouth Once A Day 7)  Iron 325 (65 Fe) Mg  Tabs (Ferrous Sulfate) .... Take 1 Tablet By Mouth Two Times A Day 8)  Aricept 10 Mg  Tabs (Donepezil Hcl) .... 1/2 Tab By Mouth Once Daily 9)  Vitamin B-12 2500 Mcg Subl (Cyanocobalamin) .... Take 1 Tablet By Mouth Once A Day 10)  Crestor 20 Mg  Tabs (Rosuvastatin Calcium) .Marland KitchenMarland KitchenMarland Kitchen  Take 1 Tablet By Mouth Once A Day 11)  Namenda 10 Mg  Tabs (Memantine Hcl) .... One By Mouth Two Times A Day 12)  Fish Oil 1200 Mg  Caps (Omega-3 Fatty Acids) .... 2 Caps Two Times A Day 13)  Pantoprazole Sodium 40 Mg Tbec (Pantoprazole Sodium) .Marland Kitchen.. 1 Each Day 30 Minutes Before Meal 14)  Quinapril Hcl 40 Mg Tabs (Quinapril Hcl) .... One By Mouth Qd 15)  Amlodipine Besylate 10 Mg Tabs (Amlodipine Besylate) .... One By Mouth Qd 16)  Vitamin D 1000 Unit Tabs (Cholecalciferol) .... Take 1 Tablet By Mouth Once A Day 17)  Furosemide 20 Mg Tabs (Furosemide) .Marland Kitchen.. 1 By Mouth Once Daily 18)  Lantus Solostar 100 Unit/ml Soln (Insulin Glargine) .Marland Kitchen.. 10- 15  Units Once Daily As Directed 19)  Bd Pen Needle Short U/f 31g X 8 Mm Misc (Insulin Pen Needle) .... Use Once Daily 20)  Isosorbide Mononitrate Cr 30 Mg Xr24h-Tab (Isosorbide Mononitrate) .... Take One Tablet By Mouth Daily 21)  Nitrostat 0.4 Mg Subl (Nitroglycerin) .Marland Kitchen.. 1 Tablet Under Tongue At Onset of Chest Pain; You May Repeat Every 5 Minutes For Up To 3 Doses.  Allergies (verified): No Known Drug Allergies  Past History:  Past Medical History: Last updated: 07/24/2009 Current Problems:  MURMUR (ICD-785.2) PVD  CVA (ICD-434.91)    CORONARY ARTERY DISEASE (ICD-414.00) Distant CABG HYPERTENSION (ICD-401.9) HYPERNATREMIA (ICD-276.0) HYPERLIPIDEMIA (ICD-272.4) ? DIVERTICULITIS ON CT (ICD-793.4)  DUODENAL ULCER, ACUTE, HEMORRHAGE (ICD-532.00) HYPERKALEMIA (ICD-276.7) DIARRHEA (ICD-787.91) ACHLORHYDRIA (ICD-536.0) ANEMIA, SECONDARY TO ACUTE BLOOD LOSS (ICD-285.1) WEAKNESS (ICD-780.79) CALLUS (ICD-700) AMAUROSIS FUGAX (ICD-362.34) UNSPECIFIED PERIPHERAL VASCULAR DISEASE (ICD-443.9) CAROTID BRUIT (ICD-785.9) DIABETES MELLITUS, TYPE II (ICD-250.00) DEMENTIA (ICD-294.8) DIABETIC NEUROPATHY  Past Surgical History: Last updated: 07/24/2009 Coronary artery bypass graft 1999        hysterectomy      Family History: Last updated: 07/24/2009 Family History of CAD Family History Hypertension Family History of Stroke   Husband has advanced dementia        Social History: Last updated: 07/24/2009 Married lives with daughter with epilepsy - mental retardation Retired   Former Smoker   Alcohol use-no       Occupation: Retired       Review of Systems       Denies fever, malais, weight loss, blurry vision, decreased visual acuity, cough, sputum,  hemoptysis, pleuritic pain, palpitaitons, heartburn, abdominal pain, melena, lower extremity edema, , or rash.   Vital Signs:  Patient profile:   77 year old female Height:      66 inches Weight:      156 pounds BMI:      25.27 Pulse rate:   53 / minute BP sitting:   120 / 64  (left arm) Cuff size:   large  Vitals Entered By: Lubertha Basque, CNA (Aug 13, 2009 12:08 PM)  Physical Exam  General:  Affect appropriate Healthy:  appears stated age 28: normal Neck supple with no adenopathy JVP normal bilateral bruits no thyromegaly Lungs clear with no wheezing and good diaphragmatic motion Heart:  [id number] sytolic  murmur,rub, gallop or click PMI normal Abdomen: benighn, BS positve, no tenderness, no AAA no bruit.  No HSM or HJR Distal pulses decreased with bilateral femoral bruits   No edema Neuro non-focal Skin warm and dry    Impression & Recommendations:  Problem # 1:  CORONARY ARTERY DISEASE (ICD-414.00) Abnormal myovue with no sscp and stable anatomy by cath 7 montsh ago.  Add Imdur and continue medical Rx Her  updated medication list for this problem includes:    Metoprolol Succinate 25 Mg Xr24h-tab (Metoprolol succinate) .Marland Kitchen... 1/2 by mouth once daily    Plavix 75 Mg Tabs (Clopidogrel bisulfate) .Marland Kitchen... Take 1 tablet by mouth once a day    Aspirin 81 Mg Tbec (Aspirin) .Marland Kitchen... Take two at breakfast and two at lunch    Quinapril Hcl 40 Mg Tabs (Quinapril hcl) ..... One by mouth qd    Amlodipine Besylate 10 Mg Tabs (Amlodipine besylate) ..... One by mouth qd    Isosorbide Mononitrate Cr 30 Mg Xr24h-tab (Isosorbide mononitrate) .Marland Kitchen... Take one tablet by mouth daily    Nitrostat 0.4 Mg Subl (Nitroglycerin) .Marland Kitchen... 1 tablet under tongue at onset of chest pain; you may repeat every 5 minutes for up to 3 doses.  Problem # 2:  PVD (ICD-443.9) No ulcers and stable claudication Continue walking program and F/U with [facility] rin 6 months  Problem # 3:  CVA (ICD-434.91) 40-59% bilateral ICA stenosis.  F/U duplex in 6 months Her updated medication list for this problem includes:    Plavix 75 Mg Tabs (Clopidogrel bisulfate) .Marland Kitchen... Take 1 tablet by mouth once a day    Aspirin 81 Mg Tbec (Aspirin) .Marland Kitchen... Take two at  breakfast and two at lunch  Problem # 4:  HYPERTENSION (ICD-401.9) Well controlled Her updated medication list for this problem includes:    Metoprolol Succinate 25 Mg Xr24h-tab (Metoprolol succinate) .Marland Kitchen... 1/2 by mouth once daily    Aspirin 81 Mg Tbec (Aspirin) .Marland Kitchen... Take two at breakfast and two at lunch    Quinapril Hcl 40 Mg Tabs (Quinapril hcl) ..... One by mouth qd    Amlodipine Besylate 10 Mg Tabs (Amlodipine besylate) ..... One by mouth qd    Furosemide 20 Mg Tabs (Furosemide) .Marland Kitchen... 1 by mouth once daily  Problem # 5:  HYPERLIPIDEMIA (ICD-272.4) At goal with no side effects Her updated medication list for this problem includes:    Crestor 20 Mg Tabs (Rosuvastatin calcium) .Marland Kitchen... Take 1 tablet by mouth once a day  CHOL: 161 (07/17/2009)   LDL: 79 (07/17/2009)   HDL: 54.00 (07/17/2009)   TG: 138.0 (07/17/2009)  Patient Instructions: 1)  Your physician recommends that you schedule a follow-up appointment in: 3 MONTHS 2)  Your physician has recommended you make the following change in your medication: START ISOSORBIDE 30MG  ONE TABLET ONCE DAILY Prescriptions: NITROSTAT 0.4 MG SUBL (NITROGLYCERIN) 1 tablet under tongue at onset of chest pain; you may repeat every 5 minutes for up to 3 doses.  #25 x 12   Entered by:   Fredia Beets, RN   Authorized by:   Bosie Clos, MD, [facility]   Signed by:   Fredia Beets, RN on 08/13/2009   Method used:   Electronically to        [facility]. [id number] (retail)       [address]       [place], [place]  [address]       Ph: [phone] or [phone]       Fax: [phone]   RxID:   [id number] ISOSORBIDE MONONITRATE CR 30 MG XR24H-TAB (ISOSORBIDE MONONITRATE) Take one tablet by mouth daily  #30 x 12   Entered by:   Fredia Beets, RN   Authorized by:   Bosie Clos, MD, [facility]   Signed by:   Fredia Beets, RN on 08/13/2009   Method used:   Electronically to  [facility]  [id number] (retail)        [address]       [place], [place]  [address]       Ph: [phone] or [phone]       Fax: [phone]   RxID:   [id number]

## 2010-04-15 NOTE — Assessment & Plan Note (Signed)
Summary: Cardiology Nuclear Study  Nuclear Med Background Indications for Stress Test: Evaluation for Ischemia, Graft Patency   History: Abnormal EKG, CABG, Echo, Heart Catheterization, Myocardial Perfusion Study  History Comments: '99 CABG x 4; '09 Echo:EF=55-60%; 10/10 [id number] ischemia>Cath:3/4 grafts patent, SVG>dx occ. (med. tx), EF=60%  Symptoms: Chest Tightness, DOE, Fatigue, Palpitations, SOB  Symptoms Comments: Chest tightness>(B) arms. Last episode of CP:6 weeks ago.   Nuclear Pre-Procedure Cardiac Risk Factors: Carotid Disease, CVA, Family History - CAD, History of Smoking, Hypertension, IDDM Type 2, Lipids, PVD, RBBB, TIA Caffeine/Decaff Intake: None NPO After: 9:00 PM Lungs: Clear.  )2 Sat 98% on RA. IV 0.9% NS with Angio Cath: 22g     IV Site: (R) wrist IV Started by: Eliezer Lofts EMT-P Chest Size (in) 38     Cup Size B     Height (in): 66 Weight (lb): 155 BMI: 25.11 Tech Comments: CBG=202 @ 9am this day, per Patient.  Nuclear Med Study 1 or 2 day study:  1 day     Stress Test Type:  Carlton Adam Reading MD:  Loralie Champagne, MD     Referring MD:  Jenkins Rouge, MD Resting Radionuclide:  Technetium 64m Tetrofosmin     Resting Radionuclide Dose:  11 mCi  Stress Radionuclide:  Technetium 41m Tetrofosmin     Stress Radionuclide Dose:  33 mCi   Stress Protocol   Lexiscan: 0.4 mg   Stress Test Technologist:  Valetta Fuller CMA-N     Nuclear Technologist:  Charlton Amor CNMT  Rest Procedure  Myocardial perfusion imaging was performed at rest 45 minutes following the intravenous administration of Myoview Technetium 59m Tetrofosmin.  Stress Procedure  The patient received IV Lexiscan 0.4 mg over 15-seconds.  Myoview injected at 30-seconds.  There were no significant changes with lexiscan.  Quantitative spect images were obtained after a 45 minute delay.  QPS Raw Data Images:  Normal; no motion artifact; normal heart/lung ratio. Stress Images:  Moderate mid to  apical anterior and apical lateral perfusion defect.  Rest Images:  Normal homogeneous uptake in all areas of the myocardium. Subtraction (SDS):  Reversible moderate mid to apical anterior and apical lateral perfusion defect.  Transient Ischemic Dilatation:  1.04  (Normal <1.22)  Lung/Heart Ratio:  .28  (Normal <0.45)  Quantitative Gated Spect Images QGS cine images:  non-gated study   Overall Impression  Exercise Capacity: Lexiscan study.  BP Response: Normal blood pressure response. Clinical Symptoms: Shortness of breath and stomach pain.  ECG Impression: No significant ST segment change suggestive of ischemia. Overall Impression: Moderate-sized, reversible mid to apical anterior and apical lateral perfusion defect suggestive of ischemia.   Appended Document: Cardiology Nuclear Study Needs cath in [place] lab with me or Burt Knack.  Left/cors and grafts.    Appended Document: Cardiology Nuclear Study pt aware of results, follow up appt made.

## 2010-04-15 NOTE — Assessment & Plan Note (Signed)
Summary: 3 month follow up/mhf   Vital Signs:  Patient profile:   77 year old female Height:      66 inches Weight:      156 pounds BMI:     25.27 O2 Sat:      100 % on Room air Temp:     97.6 degrees F oral Pulse rate:   52 / minute Pulse rhythm:   regular Resp:     16 per minute BP sitting:   180 / 76  (right arm) Cuff size:   regular  Vitals Entered By: Jiles Garter CMA (November 27, 2009 11:13 AM)  O2 Flow:  Room air CC: 3 Month  Follow up  Is Patient Diabetic? Yes Pain Assessment Patient in pain? no      Comments has trouble with memory, discuss metoprolol   Primary Care Provider:  Jennings Books, DO  CC:  3 Month  Follow up .  History of Present Illness:  Hypertension Follow-Up      This is a 77 year old woman who presents for Hypertension follow-up.  The patient denies lightheadedness.  The patient denies the following associated symptoms: chest pain.  Compliance with medications (by patient report) has been sporadic.  The patient reports that dietary compliance has been fair.    DM II - no hypoglycemia    Preventive Screening-Counseling & Management  Alcohol-Tobacco     Smoking Status: quit  Allergies (verified): No Known Drug Allergies  Past History:  Past Medical History: Current Problems:  MURMUR (ICD-785.2) PVD   CVA (ICD-434.91)     CORONARY ARTERY DISEASE (ICD-414.00) Distant CABG HYPERTENSION (ICD-401.9) HYPERNATREMIA (ICD-276.0) HYPERLIPIDEMIA (ICD-272.4) ? DIVERTICULITIS ON CT (ICD-793.4)  DUODENAL ULCER, ACUTE, HEMORRHAGE (ICD-532.00)  HYPERKALEMIA (ICD-276.7) DIARRHEA (ICD-787.91) ACHLORHYDRIA (ICD-536.0) ANEMIA, SECONDARY TO ACUTE BLOOD LOSS (ICD-285.1) WEAKNESS (ICD-780.79) CALLUS (ICD-700) AMAUROSIS FUGAX (ICD-362.34) UNSPECIFIED PERIPHERAL VASCULAR DISEASE (ICD-443.9) CAROTID BRUIT (ICD-785.9) DIABETES MELLITUS, TYPE II (ICD-250.00) DEMENTIA (ICD-294.8) DIABETIC NEUROPATHY  Past Surgical History: Coronary artery  bypass graft 1999        hysterectomy         Family History: Family History of CAD Family History Hypertension Family History of Stroke   Husband had advanced dementia          Social History: Widowed - husband passed away in 06/08/2009 lives with daughter with epilepsy - mental retardation Retired   Former Smoker   Alcohol use-no       Occupation: Retired     son - Katheleen Hamson   Physical Exam  General:  alert, well-developed, and well-nourished.   Neck:  supple and no masses.  no carotid bruits.   Lungs:  normal respiratory effort, normal breath sounds, no crackles, and no wheezes.   Heart:  normal rate, regular rhythm, and no gallop.   Extremities:  trace left pedal edema and trace right pedal edema.   Neurologic:  cranial nerves II-XII intact and gait normal.   Psych:  normally interactive, good eye contact, not anxious appearing, and not depressed appearing.     Impression & Recommendations:  Problem # 1:  HYPERTENSION (ICD-401.9) Assessment Deteriorated compliance encouraged  Her updated medication list for this problem includes:    Metoprolol Succinate 50 Mg Xr24h-tab (Metoprolol succinate) .Marland Kitchen... 1/2 by mouth once daily    Quinapril Hcl 40 Mg Tabs (Quinapril hcl) ..... One by mouth qd    Amlodipine Besylate 10 Mg Tabs (Amlodipine besylate) ..... One by mouth qd    Furosemide 20 Mg  Tabs (Furosemide) .Marland Kitchen... 1 by mouth once daily  BP today: 180/76 Prior BP: 169/69 (11/11/2009)  Labs Reviewed: K+: 4.1 (11/22/2009) Creat: : 1.5 (11/22/2009)   Chol: 161 (07/17/2009)   HDL: 54.00 (07/17/2009)   LDL: 79 (07/17/2009)   TG: 138.0 (07/17/2009)  Problem # 2:  DIABETES MELLITUS, TYPE II (ICD-250.00) Assessment: Deteriorated A1c acceptable.  hold metformin due to risk of lactic acidosis  Her updated medication list for this problem includes:    Metformin Hcl 500 Mg Xr24h-tab (Metformin hcl) ..... One by mouth three times a day (hold)    Aspirin 81 Mg Tbec (Aspirin) .Marland Kitchen...  Take two at breakfast and two at lunch    Quinapril Hcl 40 Mg Tabs (Quinapril hcl) ..... One by mouth qd    Lantus Solostar 100 Unit/ml Soln (Insulin glargine) .Marland KitchenMarland KitchenMarland KitchenMarland Kitchen 10- 15 units once daily as directed  Labs Reviewed: Creat: 1.5 (11/22/2009)     Last Eye Exam: normal (08/07/2009) Reviewed HgBA1c results: 7.5 (11/22/2009)  6.5 (07/17/2009)  Problem # 3:  RENAL INSUFFICIENCY (ICD-588.9) son states pt does not drink much during the day Orders: Ultrasound (Ultrasound) T-Urinalysis [id number])  Problem # 4:  DEMENTIA (ICD-294.8) pt and son encouraged to investigate assisted living facilities  Complete Medication List: 1)  Metformin Hcl 500 Mg Xr24h-tab (Metformin hcl) .... One by mouth three times a day (hold) 2)  Metoprolol Succinate 50 Mg Xr24h-tab (Metoprolol succinate) .... 1/2 by mouth once daily 3)  Plavix 75 Mg Tabs (Clopidogrel bisulfate) .... Take 1 tablet by mouth once a day 4)  Aspirin 81 Mg Tbec (Aspirin) .... Take two at breakfast and two at lunch 5)  Glucosamine Chondr 500 Complex Caps (Glucosamine-chondroit-vit c-mn) .... Take 1 tablet by mouth once a day 6)  Centrum Silver Tabs (Multiple vitamins-minerals) .... Take 1 tablet by mouth once a day 7)  Iron 325 (65 Fe) Mg Tabs (Ferrous sulfate) .... Take 1 tablet by mouth two times a day 8)  Aricept 10 Mg Tabs (Donepezil hcl) .... 1/2 tab by mouth once daily 9)  Vitamin B-12 2500 Mcg Subl (Cyanocobalamin) .... Take 1 tablet by mouth once a day 10)  Crestor 20 Mg Tabs (Rosuvastatin calcium) .... Take 1 tablet by mouth once a day 11)  Namenda 10 Mg Tabs (Memantine hcl) .... One by mouth two times a day 12)  Fish Oil 1200 Mg Caps (Omega-3 fatty acids) .... 2 caps two times a day 13)  Pantoprazole Sodium 40 Mg Tbec (Pantoprazole sodium) .Marland Kitchen.. 1 each day 30 minutes before meal 14)  Quinapril Hcl 40 Mg Tabs (Quinapril hcl) .... One by mouth qd 15)  Amlodipine Besylate 10 Mg Tabs (Amlodipine besylate) .... One by mouth qd 16)   Vitamin D 1000 Unit Tabs (Cholecalciferol) .... Take 1 tablet by mouth once a day 17)  Furosemide 20 Mg Tabs (Furosemide) .Marland Kitchen.. 1 by mouth once daily 18)  Lantus Solostar 100 Unit/ml Soln (Insulin glargine) .Marland Kitchen.. 10- 15 units once daily as directed 19)  Bd Pen Needle Short U/f 31g X 8 Mm Misc (Insulin pen needle) .... Use once daily 20)  Isosorbide Mononitrate Cr 30 Mg Xr24h-tab (Isosorbide mononitrate) .... Take one tablet by mouth daily 21)  Nitrostat 0.4 Mg Subl (Nitroglycerin) .Marland Kitchen.. 1 tablet under tongue at onset of chest pain; you may repeat every 5 minutes for up to 3 doses.  Other Orders: Influenza Vaccine MCR [id number]) Flu Vaccine 5yrs + MEDICARE PATIENTS [id number])  Patient Instructions: 1)  HOLD metformin 2)  Do not take lasix  for next 2-3 days 3)  Drink at least 1 liter of fluid per day 4)  Please schedule a follow-up appointment in 1 month. 5)  BMP prior to visit, ICD-9: 401.9 6)  Please return for lab work one (1) week before your next appointment.  7)  Take extra 5 units of lantus if your morning blood sugars are greater than 200.   Prescriptions: ISOSORBIDE MONONITRATE CR 30 MG XR24H-TAB (ISOSORBIDE MONONITRATE) Take one tablet by mouth daily  #30 x 0   Entered and Authorized by:   D. Drema Pry DO   Signed by:   D. Drema Pry DO on 11/27/2009   Method used:   Print then Mail to Patient   RxID:   [id number] ISOSORBIDE MONONITRATE CR 30 MG XR24H-TAB (ISOSORBIDE MONONITRATE) Take one tablet by mouth daily  #90 x 3   Entered and Authorized by:   D. Drema Pry DO   Signed by:   D. Drema Pry DO on 11/27/2009   Method used:   Faxed to ...       [facility] (mail-order)             , [place]         Ph: [phone]       Fax: [phone]   [id number]   Current Allergies (reviewed today): No known allergies     Immunizations Administered:  Influenza Vaccine # 1:    Vaccine Type: Fluvax MCR    Site: left deltoid    Mfr: GlaxoSmithKline    Dose:  0.5 ml    Route: IM    Given by: Jiles Garter CMA    Exp. Date: 09/13/2010    Lot #: [id number]    VIS given: 10/08/09 version given November 27, 2009.  Flu Vaccine Consent Questions:    Do you have a history of severe allergic reactions to this vaccine? no    Any prior history of allergic reactions to egg and/or gelatin? no    Do you have a sensitivity to the preservative Thimersol? no    Do you have a past history of Guillan-Barre Syndrome? no    Do you currently have an acute febrile illness? no    Have you ever had a severe reaction to latex? no    Vaccine information given and explained to patient? yes    Are you currently pregnant? no

## 2010-04-17 NOTE — Progress Notes (Signed)
Summary: refill meds  Phone Note Refill Request Message from:  Patient on April 11, 2010 2:19 PM  Refills Requested: Medication #1:  NITROSTAT 0.4 MG SUBL 1 tablet under tongue at onset of chest pain; you may repeat every 5 minutes for up to 3 doses. [facility] [address]   Method Requested: Fax to [facility] Initial call taken by: Neil Crouch,  April 11, 2010 2:19 PM    Prescriptions: NITROSTAT 0.4 MG SUBL (NITROGLYCERIN) 1 tablet under tongue at onset of chest pain; you may repeat every 5 minutes for up to 3 doses.  #25 x 12   Entered by:   Burnett Kanaris   Authorized by:   Bosie Clos, MD, [facility]   Signed by:   Burnett Kanaris on 04/11/2010   Method used:   Electronically to        [facility]. [id number]* (retail)       [address]       [place], [place]  [address]       Ph: [phone] or [phone]       Fax: [phone]   RxID:   [id number]

## 2010-04-17 NOTE — Assessment & Plan Note (Signed)
Summary: 3 month fu/dt   Vital Signs:  Patient profile:   77 year old female Height:      66 inches Weight:      162.50 pounds BMI:     26.32 O2 Sat:      96 % on Room air Temp:     97.5 degrees F oral Pulse rate:   47 / minute Resp:     18 per minute BP sitting:   138 / 60  (right arm) Cuff size:   large  Vitals Entered By: Jiles Garter CMA (March 27, 2010 10:34 AM)  O2 Flow:  Room air CC: 3 Month Follow up Is Patient Diabetic? Yes Did you bring your meter with you today? Yes Pain Assessment Patient in pain? no        Primary Care Provider:  Jennings Books, DO  CC:  3 Month Follow up.  History of Present Illness:  77 y/o white female for f/u pt reports chronic episodic loose stools no fever or chills  DM II  blood sugar this am was  190 low blood sugar 64 high 349, avg appears to be around 200 blood sugars labile   Preventive Screening-Counseling & Management  Alcohol-Tobacco     Smoking Status: quit  Allergies (verified): No Known Drug Allergies  Past History:  Past Medical History: Current Problems:  MURMUR (ICD-785.2) PVD   CVA (ICD-434.91)     CORONARY ARTERY DISEASE (ICD-414.00) Distant CABG  HYPERTENSION (ICD-401.9) HYPERNATREMIA (ICD-276.0) HYPERLIPIDEMIA (ICD-272.4) ? DIVERTICULITIS ON CT (ICD-793.4)  DUODENAL ULCER, ACUTE, HEMORRHAGE (ICD-532.00)  HYPERKALEMIA (ICD-276.7) DIARRHEA (ICD-787.91)  ACHLORHYDRIA (ICD-536.0) ANEMIA, SECONDARY TO ACUTE BLOOD LOSS (ICD-285.1) WEAKNESS (ICD-780.79) CALLUS (ICD-700) AMAUROSIS FUGAX (ICD-362.34) UNSPECIFIED PERIPHERAL VASCULAR DISEASE (ICD-443.9) CAROTID BRUIT (ICD-785.9) DIABETES MELLITUS, TYPE II (ICD-250.00) DEMENTIA (ICD-294.8) DIABETIC NEUROPATHY  Physical Exam  General:  alert, well-developed, and well-nourished.   Lungs:  normal respiratory effort, normal breath sounds, no crackles, and no wheezes.   Heart:  normal rate, regular rhythm, and no gallop.   Abdomen:  soft,  non-tender, normal bowel sounds, and no masses.     Impression & Recommendations:  Problem # 1:  DIARRHEA (ICD-787.91) chronic episodic loose stools question related to meds (statin, aricept) trial of welchol  Problem # 2:  DIABETES MELLITUS, TYPE II (ICD-250.00) blood sugars are labile.  add meal time insulin.  pt advised to monitor for hypoglycemia and decrease lantus dose as directed   Her updated medication list for this problem includes:    Aspirin 81 Mg Tbec (Aspirin) .Marland Kitchen... Take two at breakfast and two at lunch    Quinapril Hcl 40 Mg Tabs (Quinapril hcl) ..... One by mouth qd    Lantus Solostar 100 Unit/ml Soln (Insulin glargine) .Marland Kitchen... 25 -30 units once daily as directed    Humalog Kwikpen 100 Unit/ml Soln (Insulin lispro (human)) ..... Use 5- 10 units three times a day before meals  Complete Medication List: 1)  Metoprolol Succinate 50 Mg Xr24h-tab (Metoprolol succinate) .... 1/2 by mouth once daily 2)  Plavix 75 Mg Tabs (Clopidogrel bisulfate) .... Take 1 tablet by mouth once a day 3)  Aspirin 81 Mg Tbec (Aspirin) .... Take two at breakfast and two at lunch 4)  Glucosamine Chondr 500 Complex Caps (Glucosamine-chondroit-vit c-mn) .... Take 1 tablet by mouth once a day 5)  Centrum Silver Tabs (Multiple vitamins-minerals) .... Take 1 tablet by mouth once a day 6)  Iron 325 (65 Fe) Mg Tabs (Ferrous sulfate) .... Take 1 tablet by mouth  two times a day 7)  Aricept 10 Mg Tabs (Donepezil hcl) .... 1/2 tab by mouth once daily 8)  Vitamin B-12 2500 Mcg Subl (Cyanocobalamin) .... Take 1 tablet by mouth once a day 9)  Crestor 20 Mg Tabs (Rosuvastatin calcium) .... Take 1 tablet by mouth once a day 10)  Namenda 10 Mg Tabs (Memantine hcl) .... One by mouth two times a day 11)  Fish Oil 1200 Mg Caps (Omega-3 fatty acids) .... 2 caps two times a day 12)  Pantoprazole Sodium 40 Mg Tbec (Pantoprazole sodium) .Marland Kitchen.. 1 each day 30 minutes before meal 13)  Quinapril Hcl 40 Mg Tabs (Quinapril hcl)  .... One by mouth qd 14)  Amlodipine Besylate 10 Mg Tabs (Amlodipine besylate) .... One by mouth qd 15)  Vitamin D 1000 Unit Tabs (Cholecalciferol) .... Take 1 tablet by mouth once a day 16)  Furosemide 20 Mg Tabs (Furosemide) .Marland Kitchen.. 1 by mouth once daily 17)  Lantus Solostar 100 Unit/ml Soln (Insulin glargine) .... 25 -30 units once daily as directed 18)  Bd Pen Needle Short U/f 31g X 8 Mm Misc (Insulin pen needle) .... Use 4 x daily as directed 19)  Isosorbide Mononitrate Cr 30 Mg Xr24h-tab (Isosorbide mononitrate) .... Take one tablet by mouth daily 20)  Nitrostat 0.4 Mg Subl (Nitroglycerin) .Marland Kitchen.. 1 tablet under tongue at onset of chest pain; you may repeat every 5 minutes for up to 3 doses. 21)  Humalog Kwikpen 100 Unit/ml Soln (Insulin lispro (human)) .... Use 5- 10 units three times a day before meals 22)  Welchol 625 Mg Tabs (Colesevelam hcl) .Marland Kitchen.. 1 - 2 tabs by mouth two times a day with meals  Patient Instructions: 1)  Please schedule a follow-up appointment in 1 month Prescriptions: WELCHOL 625 MG TABS (COLESEVELAM HCL) 1 - 2 tabs by mouth two times a day with meals  #360 x 3   Entered and Authorized by:   D. Drema Pry DO   Signed by:   D. Drema Pry DO on 03/27/2010   Method used:   Electronically to        [facility]. [id number]* (retail)       [address]       [place], [place]  [address]       Ph: [phone] or [phone]       Fax: [phone]   RxID:   [id number] BD PEN NEEDLE SHORT U/F 31G X 8 MM MISC (INSULIN PEN NEEDLE) use 4 x daily as directed  #360 x 3   Entered and Authorized by:   D. Drema Pry DO   Signed by:   D. Drema Pry DO on 03/27/2010   Method used:   Electronically to        [facility]. [id number]* (retail)       [address]       [place], [place]  [address]       Ph: [phone] or [phone]       Fax: [phone]   RxID:   [id number] NOVOLOG FLEXPEN 100 UNIT/ML SOLN (INSULIN ASPART) use 5 -  10 units three times a day before meals  #1 month x 3   Entered and Authorized by:   D. Drema Pry DO   Signed by:   D. Drema Pry DO on 03/27/2010   Method used:   Electronically to  [facility]  [address] [id number] (retail[facility]       [address]       [place], [place]  [address]       Ph: [phone] or [phone]       Fax: [phone]   RxID:   [id number]    Orders Added: 1)  Est. Patient Level III [id number]    Current Allergies (reviewed today): No known allergies

## 2010-04-17 NOTE — Progress Notes (Signed)
Summary: Crestor Refill  Phone Note Refill Request Call back at Home Phone [phone] Message from:  Patient on March 07, 2010 10:53 AM  Refills Requested: Medication #1:  CRESTOR 20 MG  TABS Take 1 tablet by mouth once a day   Dosage confirmed as above?Dosage Confirmed   Brand Name Necessary? No   Supply Requested: 1 month patient called and left voice message requesting refil to [facility] for Crestor. She states she will no longer be able to use mail order   Method Requested: Electronic Next Appointment Scheduled: 03/27/2010 Initial call taken by: Jiles Garter CMA,  March 07, 2010 10:53 AM Caller: Patient Call For: D. Drema Pry DO  Follow-up for Phone Call        Rx completed in Dr. Brantley Stage Follow-up by: Jiles Garter CMA,  March 07, 2010 10:55 AM    Prescriptions: CRESTOR 20 MG  TABS (ROSUVASTATIN CALCIUM) Take 1 tablet by mouth once a day  #30 x 2   Entered by:   Jiles Garter CMA   Authorized by:   D. Drema Pry DO   Signed by:   Jiles Garter CMA on 03/07/2010   Method used:   Electronically to        [facility]. [id number]* (retail)       [address]       [place], [place]  [address]       Ph: [phone] or [phone]       Fax: [phone]   RxID:   [id number]

## 2010-04-17 NOTE — Progress Notes (Signed)
Summary: Novolog Problems  Phone Note Call from Patient Call back at Home Phone [phone]   Caller: Patient Call For: D. Drema Pry DO Summary of Call: patient called and left voice message stating her insulin was changed to Novolog at her last office visit. Her message states since she has taken the shots, she has developed bruises on her stomach where she has been injecting her shots. The bruising she states is the size of a dollar bill. She has swithced the side of the her injection to the left, and she states this morning she was awakened by skin irritation, and she would like to know to if she should continue with the injections or should she try something else. Initial call taken by: Jiles Garter CMA,  April 08, 2010 11:25 AM  Follow-up for Phone Call        plz have pt pick up samples of humalog to see if any difference Follow-up by: D. Drema Pry DO,  April 08, 2010 11:50 AM  Additional Follow-up for Phone Call Additional follow up Details #1::        call returned to patient at [phone], she has been advised per Dr Shawna Orleans instructions. Samples of Humalog  Kwikpen left for patient pick up. Additional Follow-up by: Jiles Garter CMA,  April 08, 2010 1:55 PM    New/Updated Medications: HUMALOG KWIKPEN 100 UNIT/ML SOLN (INSULIN LISPRO (HUMAN)) use 5- 10 units three times a day before meals Prescriptions: HUMALOG KWIKPEN 100 UNIT/ML SOLN (INSULIN LISPRO (HUMAN)) use 5- 10 units three times a day before meals  #1 month x 0   Entered and Authorized by:   D. Drema Pry DO   Signed by:   D. Drema Pry DO on 04/08/2010   Method used:   Samples Given   RxID:   [id number]

## 2010-04-23 NOTE — Progress Notes (Signed)
Summary: BD Pen Needle  Phone Note Refill Request Message from:  Fax from Pharmacy on April 14, 2010 8:58 AM  Refills Requested: Medication #1:  BD PEN NEEDLE SHORT U/F 31G X 8 MM MISC use 4 x daily as directed   Dosage confirmed as above?Dosage Confirmed   Brand Name Necessary? No   Supply Requested: 1 month   [facility] [address] [place]man [id number]   Method Requested: Electronic Next Appointment Scheduled: 04-29-10 yoo  Initial call taken by: Shanon Payor,  April 14, 2010 8:59 AM    Prescriptions: BD PEN NEEDLE SHORT U/F 31G X 8 MM MISC (INSULIN PEN NEEDLE) use 4 x daily as directed  #360 x 3   Entered by:   Jiles Garter CMA   Authorized by:   D. Drema Pry DO   Signed by:   Jiles Garter CMA on 04/14/2010   Method used:   Electronically to        [facility]. [id number]* (retail)       [address]       [place], [place]  [address]       Ph: [phone] or [phone]       Fax: [phone]   RxID:   [id number]

## 2010-04-29 ENCOUNTER — Encounter: Payer: Self-pay | Admitting: Internal Medicine

## 2010-04-29 ENCOUNTER — Ambulatory Visit (INDEPENDENT_AMBULATORY_CARE_PROVIDER_SITE_OTHER): Payer: 59 | Admitting: Internal Medicine

## 2010-04-29 DIAGNOSIS — I10 Essential (primary) hypertension: Secondary | ICD-10-CM

## 2010-04-29 DIAGNOSIS — E119 Type 2 diabetes mellitus without complications: Secondary | ICD-10-CM

## 2010-05-22 NOTE — Assessment & Plan Note (Signed)
Summary: 1 month follow up/mhf   Vital Signs:  Patient profile:   77 year old female Height:      66 inches Weight:      164.25 pounds BMI:     26.61 O2 Sat:      98 % on Room air Temp:     98.2 degrees F oral Pulse rate:   53 / minute Resp:     20 per minute BP sitting:   130 / 60  (right arm) Cuff size:   large  Vitals Entered By: Jiles Garter CMA (April 29, 2010 10:21 AM)  O2 Flow:  Room air CC: 1 Month Follow up  Is Patient Diabetic? Yes Did you bring your meter with you today? No Pain Assessment Patient in pain? no        Primary Care Provider:  Jennings Books, DO  CC:  1 Month Follow up .  History of Present Illness:   77 year old female for followup regarding type 2 diabetes Blood sugars still somewhat labile Low blood sugar 79, high 284, today's morning blood sugar was 184 She has trouble remembering to use Humalog before meals Dietary compliance is fair  Preventive Screening-Counseling & Management  Alcohol-Tobacco     Smoking Status: quit  Allergies (verified): No Known Drug Allergies  Past History:  Past Medical History: Current Problems:  MURMUR (ICD-785.2)  PVD   CVA (ICD-434.91)     CORONARY ARTERY DISEASE (ICD-414.00) Distant CABG  HYPERTENSION (ICD-401.9) HYPERNATREMIA (ICD-276.0) HYPERLIPIDEMIA (ICD-272.4) ? DIVERTICULITIS ON CT (ICD-793.4)  DUODENAL ULCER, ACUTE, HEMORRHAGE (ICD-532.00)  HYPERKALEMIA (ICD-276.7) DIARRHEA (ICD-787.91)  ACHLORHYDRIA (ICD-536.0) ANEMIA, SECONDARY TO ACUTE BLOOD LOSS (ICD-285.1) WEAKNESS (ICD-780.79) CALLUS (ICD-700) AMAUROSIS FUGAX (ICD-362.34) UNSPECIFIED PERIPHERAL VASCULAR DISEASE (ICD-443.9) CAROTID BRUIT (ICD-785.9) DIABETES MELLITUS, TYPE II (ICD-250.00) DEMENTIA (ICD-294.8) DIABETIC NEUROPATHY  Past Surgical History: Coronary artery bypass graft 1999        hysterectomy           Family History: Family History of CAD Family History Hypertension Family History of Stroke     Husband had advanced dementia            Social History: Widowed - husband passed away in 2009/06/09 lives with daughter with epilepsy - mental retardation Retired   Former Smoker    Alcohol use-no        Occupation: Retired     son - Edana Kinnaird   Physical Exam  General:  alert, well-developed, and well-nourished.   Lungs:  normal respiratory effort and normal breath sounds.   Heart:  normal rate, regular rhythm, and no gallop.   Extremities:  trace left pedal edema and trace right pedal edema.     Impression & Recommendations:  Problem # 1:  DIABETES MELLITUS, TYPE II (ICD-250.00) Assessment Unchanged diabetes control limited by sporadic compliance with mealtime insulin  family may be able to assist in reminding pt to take her insulin as directed    Her updated medication list for this problem includes:    Aspirin 81 Mg Tbec (Aspirin) .Marland Kitchen... Take two at breakfast and two at lunch    Quinapril Hcl 40 Mg Tabs (Quinapril hcl) ..... One by mouth qd    Lantus Solostar 100 Unit/ml Soln (Insulin glargine) .Marland Kitchen... 25 -30 units once daily as directed    Humalog Kwikpen 100 Unit/ml Soln (Insulin lispro (human)) ..... Use 5- 10 units three times a day before meals  Labs Reviewed: Creat: 1.2 (03/19/2010)     Last Eye Exam: normal (08/07/2009)  Reviewed HgBA1c results: 8.8 (03/19/2010)  7.5 (11/22/2009)  Problem # 2:  HYPERTENSION (ICD-401.9) Assessment: Unchanged  Her updated medication list for this problem includes:    Metoprolol Succinate 50 Mg Xr24h-tab (Metoprolol succinate) .Marland Kitchen... 1/2 by mouth once daily    Quinapril Hcl 40 Mg Tabs (Quinapril hcl) ..... One by mouth qd    Amlodipine Besylate 10 Mg Tabs (Amlodipine besylate) ..... One by mouth qd    Furosemide 20 Mg Tabs (Furosemide) .Marland Kitchen... 1 by mouth once daily  BP today: 130/60 Prior BP: 138/60 (03/27/2010)  Labs Reviewed: K+: 4.7 (03/19/2010) Creat: : 1.2 (03/19/2010)   Chol: 161 (07/17/2009)   HDL: 54.00 (07/17/2009)    LDL: 79 (07/17/2009)   TG: 138.0 (07/17/2009)  Complete Medication List: 1)  Metoprolol Succinate 50 Mg Xr24h-tab (Metoprolol succinate) .... 1/2 by mouth once daily 2)  Plavix 75 Mg Tabs (Clopidogrel bisulfate) .... Take 1 tablet by mouth once a day 3)  Aspirin 81 Mg Tbec (Aspirin) .... Take two at breakfast and two at lunch 4)  Glucosamine Chondr 500 Complex Caps (Glucosamine-chondroit-vit c-mn) .... Take 1 tablet by mouth once a day 5)  Centrum Silver Tabs (Multiple vitamins-minerals) .... Take 1 tablet by mouth once a day 6)  Iron 325 (65 Fe) Mg Tabs (Ferrous sulfate) .... Take 1 tablet by mouth two times a day 7)  Aricept 10 Mg Tabs (Donepezil hcl) .... 1/2 tab by mouth once daily 8)  Vitamin B-12 2500 Mcg Subl (Cyanocobalamin) .... Take 1 tablet by mouth once a day 9)  Crestor 20 Mg Tabs (Rosuvastatin calcium) .... Take 1 tablet by mouth once a day 10)  Namenda 10 Mg Tabs (Memantine hcl) .... One by mouth two times a day 11)  Fish Oil 1200 Mg Caps (Omega-3 fatty acids) .... 2 caps two times a day 12)  Pantoprazole Sodium 40 Mg Tbec (Pantoprazole sodium) .Marland Kitchen.. 1 each day 30 minutes before meal 13)  Quinapril Hcl 40 Mg Tabs (Quinapril hcl) .... One by mouth qd 14)  Amlodipine Besylate 10 Mg Tabs (Amlodipine besylate) .... One by mouth qd 15)  Vitamin D 1000 Unit Tabs (Cholecalciferol) .... Take 1 tablet by mouth once a day 16)  Furosemide 20 Mg Tabs (Furosemide) .Marland Kitchen.. 1 by mouth once daily 17)  Lantus Solostar 100 Unit/ml Soln (Insulin glargine) .... 25 -30 units once daily as directed 18)  Bd Pen Needle Short U/f 31g X 8 Mm Misc (Insulin pen needle) .... Use 4 x daily as directed 19)  Isosorbide Mononitrate Cr 30 Mg Xr24h-tab (Isosorbide mononitrate) .... Take one tablet by mouth daily 20)  Nitrostat 0.4 Mg Subl (Nitroglycerin) .Marland Kitchen.. 1 tablet under tongue at onset of chest pain; you may repeat every 5 minutes for up to 3 doses. 21)  Humalog Kwikpen 100 Unit/ml Soln (Insulin lispro (human))  .... Use 5- 10 units three times a day before meals 22)  Welchol 625 Mg Tabs (Colesevelam hcl) .Marland Kitchen.. 1 - 2 tabs by mouth two times a day with meals  Patient Instructions: 1)  Please schedule a follow-up appointment in 2 months. 2)  BMP prior to visit, ICD-9:  401.9 3)  HbgA1C prior to visit, ICD-9:  250.02 4)  FLP, LFTs:  272.4 5)  Please return for lab work one (1) week before your next appointment.  Prescriptions: METOPROLOL SUCCINATE 50 MG XR24H-TAB (METOPROLOL SUCCINATE) 1/2 by mouth once daily  #45 x 1   Entered and Authorized by:   D. Drema Pry DO   Signed by:  Jennings Books DO on 04/29/2010   Method used:   Print then Give to Patient   RxID:   [id number] PLAVIX 75 MG  TABS (CLOPIDOGREL BISULFATE) Take 1 tablet by mouth once a day  #90 x 1   Entered and Authorized by:   D. Drema Pry DO   Signed by:   D. Drema Pry DO on 04/29/2010   Method used:   Print then Give to Patient   RxID:   [id number] ARICEPT 10 MG  TABS (DONEPEZIL HCL) 1/2 tab by mouth once daily  #45 x 3   Entered and Authorized by:   D. Drema Pry DO   Signed by:   D. Drema Pry DO on 04/29/2010   Method used:   Print then Give to Patient   RxID:   [id number] LANTUS SOLOSTAR 100 UNIT/ML SOLN (INSULIN GLARGINE) 25 -30 units once daily as directed  #1 month x 5   Entered and Authorized by:   D. Drema Pry DO   Signed by:   D. Drema Pry DO on 04/29/2010   Method used:   Print then Give to Patient   RxID:   [id number] HUMALOG KWIKPEN 100 UNIT/ML SOLN (INSULIN LISPRO (HUMAN)) use 5- 10 units three times a day before meals  #1 month x 5   Entered and Authorized by:   D. Drema Pry DO   Signed by:   D. Drema Pry DO on 04/29/2010   Method used:   Print then Give to Patient   RxID:   [id number] FUROSEMIDE 20 MG TABS (FUROSEMIDE) 1 by mouth once daily  #90 x 1   Entered and Authorized by:   D. Drema Pry DO   Signed by:   D. Drema Pry DO on 04/29/2010   Method used:   Print then Give  to Patient   RxID:   [id number] AMLODIPINE BESYLATE 10 MG TABS (AMLODIPINE BESYLATE) one by mouth qd  #90 x 1   Entered and Authorized by:   D. Drema Pry DO   Signed by:   D. Drema Pry DO on 04/29/2010   Method used:   Print then Give to Patient   RxID:   [id number] QUINAPRIL HCL 40 MG TABS (QUINAPRIL HCL) one by mouth qd  #90 x 1   Entered and Authorized by:   D. Drema Pry DO   Signed by:   D. Drema Pry DO on 04/29/2010   Method used:   Print then Give to Patient   RxID:   [id number] PANTOPRAZOLE SODIUM 40 MG TBEC (PANTOPRAZOLE SODIUM) 1 each day 30 minutes before meal  #90 x 1   Entered and Authorized by:   D. Drema Pry DO   Signed by:   D. Drema Pry DO on 04/29/2010   Method used:   Print then Give to Patient   RxID:   [id number] NAMENDA 10 MG  TABS (MEMANTINE HCL) one by mouth two times a day  #180 x 1   Entered and Authorized by:   D. Drema Pry DO   Signed by:   D. Drema Pry DO on 04/29/2010   Method used:   Print then Give to Patient   RxID:   [id number] CRESTOR 20 MG  TABS (ROSUVASTATIN CALCIUM) Take 1 tablet by mouth once a day  #90 x 1   Entered and Authorized by:   D. Drema Pry DO   Signed by:   D. Drema Pry DO on 04/29/2010   Method used:  Print then Give to Patient   RxID:   [id number]    Orders Added: 1)  Est. Patient Level III [id number]    Current Allergies (reviewed today): No known allergies

## 2010-06-02 ENCOUNTER — Encounter: Payer: Self-pay | Admitting: Family

## 2010-06-02 ENCOUNTER — Telehealth: Payer: Self-pay | Admitting: Internal Medicine

## 2010-06-02 ENCOUNTER — Ambulatory Visit (INDEPENDENT_AMBULATORY_CARE_PROVIDER_SITE_OTHER): Payer: 59 | Admitting: Family

## 2010-06-02 ENCOUNTER — Ambulatory Visit: Payer: 59 | Admitting: Internal Medicine

## 2010-06-02 DIAGNOSIS — M25559 Pain in unspecified hip: Secondary | ICD-10-CM

## 2010-06-11 ENCOUNTER — Telehealth: Payer: Self-pay | Admitting: Internal Medicine

## 2010-06-11 MED ORDER — QUINAPRIL HCL 40 MG PO TABS: 40.0000 mg | ORAL_TABLET | Freq: Every day | ORAL | Status: DC

## 2010-06-11 NOTE — Telephone Encounter (Signed)
Pt daughter Destiny [name] called stating that [facility] faxed over request to refill Quinipril. Pharmacy has not heard anything yet. Pt is out of Quinipril. Pt daughter also states that she would like to use [facility] pharmacy for all of pt rx's.

## 2010-06-11 NOTE — Telephone Encounter (Signed)
Spoke with pt and advised her we were processing request for Quinapril. Instructed pt to contact previous pharmacy and have them transfer her current Rxs to [facility] if she wished to change pharmacies. She states that her daughter has contacted her pharmacy.

## 2010-06-12 NOTE — Assessment & Plan Note (Signed)
Summary: PINCHED NERVE IN LEG/MHF--rm 4   Vital Signs:  Patient profile:   77 year old female Height:      66 inches Weight:      165.75 pounds BMI:     26.85 Temp:     97.7 degrees F oral Pulse rate:   66 / minute Pulse rhythm:   regular Resp:     16 per minute BP sitting:   124 / 62  (right arm) Cuff size:   regular  Vitals Entered By: Kelle Darting CMA Deborra Medina) (June 02, 2010 3:57 PM) CC: Pt states she has had right hip and leg pain x 1 week. Is Patient Diabetic? Yes Pain Assessment Patient in pain? yes     Location: right hip and leg Type: constant Onset of pain  1 week, worse with standing Comments Pt agrees all med doses and directions are correct. Kelle Darting CMA Deborra Medina)  June 02, 2010 4:13 PM    Primary Care Provider:  Jennings Books, DO  CC:  Pt states she has had right hip and leg pain x 1 week.Marland Kitchen  History of Present Illness: Destiny [name] is a 77 year old female who presents today with chief complaint of R hip pain.  Pain started 2 weeks ago.   Starts in the right hip lateral hip and occasionally radiates to the toes.  Has tried some advil with some improvement.  Pain is worse with weight bearing.  9/10 with standing.  Difficulty walking due to the pain.  No known injury.  Denies history of back problems.  Allergies (verified): No Known Drug Allergies  Past History:  Past Medical History: Last updated: 04/29/2010 Current Problems:  MURMUR (ICD-785.2)  PVD   CVA (ICD-434.91)     CORONARY ARTERY DISEASE (ICD-414.00) Distant CABG  HYPERTENSION (ICD-401.9) HYPERNATREMIA (ICD-276.0) HYPERLIPIDEMIA (ICD-272.4) ? DIVERTICULITIS ON CT (ICD-793.4)  DUODENAL ULCER, ACUTE, HEMORRHAGE (ICD-532.00)  HYPERKALEMIA (ICD-276.7) DIARRHEA (ICD-787.91)  ACHLORHYDRIA (ICD-536.0) ANEMIA, SECONDARY TO ACUTE BLOOD LOSS (ICD-285.1) WEAKNESS (ICD-780.79) CALLUS (ICD-700) AMAUROSIS FUGAX (ICD-362.34) UNSPECIFIED PERIPHERAL VASCULAR DISEASE (ICD-443.9) CAROTID BRUIT  (ICD-785.9) DIABETES MELLITUS, TYPE II (ICD-250.00) DEMENTIA (ICD-294.8) DIABETIC NEUROPATHY  Review of Systems       see HPI  Physical Exam  General:  Well-developed,well-nourished,in no acute distress; alert,appropriate and cooperative throughout examination Lungs:  Normal respiratory effort, chest expands symmetrically. Lungs are clear to auscultation, no crackles or wheezes. Heart:  Normal rate and regular rhythm. S1 and S2 normal without gallop, murmur, click, rub or other extra sounds. Msk:  bialteral LE strength is 5/5 Neurologic:  1-2+ bilateral patellar reflexes.   Detailed Back/Spine Exam  General:    Well-developed, well-nourished, in no acute distress; alert and oriented x 3.    Gait:    Normal heel-toe gait pattern bilaterally.    Lumbosacral Exam:  Lying Straight Leg Raise:    Right:  positive    Left:  negative Toe Walking:    Right:  normal    Left:  normal Heel Walking:    Right:  normal    Left:  normal   Impression & Recommendations:  Problem # 1:  HIP PAIN, RIGHT (ICD-719.45) Assessment New Only having pain with standing.  Will give trial of Tylenol as needed, and tramadol.  (avoid NSAIDS given hx or GI Bleed , CRI, and CAD). Recommended that she try some non- weight bearing exercises such as gentle swimming in the pool.  If symptoms worsen or do not improve, consider MRI of the LS spine.  Her updated medication list for this problem includes:    Aspirin 81 Mg Tbec (Aspirin) .Marland Kitchen... Take two at breakfast and two at lunch    Tramadol Hcl 50 Mg Tabs (Tramadol hcl) .Marland Kitchen... 1/2 to 1 tablet by mouth every 8 hours as needed for severe pain.  Complete Medication List: 1)  Metoprolol Succinate 50 Mg Xr24h-tab (Metoprolol succinate) .... 1/2 by mouth once daily 2)  Plavix 75 Mg Tabs (Clopidogrel bisulfate) .... Take 1 tablet by mouth once a day 3)  Aspirin 81 Mg Tbec (Aspirin) .... Take two at breakfast and two at lunch 4)  Glucosamine Chondr 500 Complex  Caps (Glucosamine-chondroit-vit c-mn) .... Take 1 tablet by mouth once a day 5)  Centrum Silver Tabs (Multiple vitamins-minerals) .... Take 1 tablet by mouth once a day 6)  Iron 325 (65 Fe) Mg Tabs (Ferrous sulfate) .... Take 1 tablet by mouth two times a day 7)  Aricept 10 Mg Tabs (Donepezil hcl) .... 1/2 tab by mouth once daily 8)  Vitamin B-12 2500 Mcg Subl (Cyanocobalamin) .... Take 1 tablet by mouth once a day 9)  Crestor 20 Mg Tabs (Rosuvastatin calcium) .... Take 1 tablet by mouth once a day 10)  Namenda 10 Mg Tabs (Memantine hcl) .... One by mouth two times a day 11)  Fish Oil 1200 Mg Caps (Omega-3 fatty acids) .... 2 caps two times a day 12)  Pantoprazole Sodium 40 Mg Tbec (Pantoprazole sodium) .Marland Kitchen.. 1 each day 30 minutes before meal 13)  Quinapril Hcl 40 Mg Tabs (Quinapril hcl) .... One by mouth qd 14)  Amlodipine Besylate 10 Mg Tabs (Amlodipine besylate) .... One by mouth qd 15)  Vitamin D 1000 Unit Tabs (Cholecalciferol) .... Take 1 tablet by mouth once a day 16)  Furosemide 20 Mg Tabs (Furosemide) .Marland Kitchen.. 1 by mouth once daily 17)  Lantus Solostar 100 Unit/ml Soln (Insulin glargine) .... 25 -30 units once daily as directed 18)  Bd Pen Needle Short U/f 31g X 8 Mm Misc (Insulin pen needle) .... Use 4 x daily as directed 19)  Isosorbide Mononitrate Cr 30 Mg Xr24h-tab (Isosorbide mononitrate) .... Take one tablet by mouth daily 20)  Nitrostat 0.4 Mg Subl (Nitroglycerin) .Marland Kitchen.. 1 tablet under tongue at onset of chest pain; you may repeat every 5 minutes for up to 3 doses. 21)  Humalog Kwikpen 100 Unit/ml Soln (Insulin lispro (human)) .... Use 5- 10 units three times a day before meals 22)  Welchol 625 Mg Tabs (Colesevelam hcl) .Marland Kitchen.. 1 - 2 tabs by mouth two times a day with meals 23)  Tramadol Hcl 50 Mg Tabs (Tramadol hcl) .... 1/2 to 1 tablet by mouth every 8 hours as needed for severe pain.  Patient Instructions: 1)  Please schedule a follow-up appointment in 1 month. 2)  Call sooner if your  symptoms worsen or do not improve.  3)  Take 650-1000mg  of Tylenol every 4-6 hours as needed for relief of pain or AVOID taking more than 4000mg   in a 24 hour period (can cause liver damage in higher doses). Prescriptions: TRAMADOL HCL 50 MG TABS (TRAMADOL HCL) 1/2 to 1 tablet by mouth every 8 hours as needed for severe pain.  #20 x 0   Entered and Authorized by:   Nance Pear FNP   Signed by:   Nance Pear FNP on 06/02/2010   Method used:   Electronically to        [facility] [phone]* (retail)  [address]       [place], [place]  [address]       Ph: [phone] or [phone]       Fax: [phone]   RxID:   [id number]    Orders Added: 1)  Est. Patient Level III [id number]    Current Allergies (reviewed today): No known allergies

## 2010-06-12 NOTE — Progress Notes (Signed)
Summary: Low back pain  Phone Note Call from Patient Call back at Home Phone [phone]   Caller: Patient Call For: D. Drema Pry DO Summary of Call: patient called and left voice message stating she is having sever sciatic nerve pain. Initial call taken by: Jiles Garter CMA,  June 02, 2010 11:34 AM  Follow-up for Phone Call        call placed to patient [phone], patient states she made a mistake. she meant to push for appointments. Patient has been placed on the schedule for 3p today for evaulation. Follow-up by: Jiles Garter CMA,  June 02, 2010 12:11 PM

## 2010-06-13 ENCOUNTER — Telehealth: Payer: Self-pay | Admitting: Internal Medicine

## 2010-06-13 DIAGNOSIS — E785 Hyperlipidemia, unspecified: Secondary | ICD-10-CM

## 2010-06-13 MED ORDER — ROSUVASTATIN CALCIUM 20 MG PO TABS: 20.0000 mg | ORAL_TABLET | Freq: Every day | ORAL | Status: DC

## 2010-06-13 NOTE — Telephone Encounter (Signed)
Refill- crestor 20mg  tablet. Take 1 tablet by mouth once a day. Qty 30. Last fill 2.23.12

## 2010-06-13 NOTE — Telephone Encounter (Signed)
rx refill sent to pharmacy 

## 2010-06-19 LAB — BASIC METABOLIC PANEL
CO2: 24 mEq/L (ref 19–32)
Calcium: 9.3 mg/dL (ref 8.4–10.5)
Glucose, Bld: 143 mg/dL — ABNORMAL HIGH (ref 70–99)
Potassium: 3.9 mEq/L (ref 3.5–5.1)
Sodium: 140 mEq/L (ref 135–145)

## 2010-06-19 LAB — CBC
HCT: 27.2 % — ABNORMAL LOW (ref 36.0–46.0)
HCT: 30.8 % — ABNORMAL LOW (ref 36.0–46.0)
Hemoglobin: 10.7 g/dL — ABNORMAL LOW (ref 12.0–15.0)
Hemoglobin: 9.4 g/dL — ABNORMAL LOW (ref 12.0–15.0)
MCHC: 34.7 g/dL (ref 30.0–36.0)
MCV: 89.9 fL (ref 78.0–100.0)
RBC: 3.03 MIL/uL — ABNORMAL LOW (ref 3.87–5.11)
RDW: 14.6 % (ref 11.5–15.5)
WBC: 6.7 10*3/uL (ref 4.0–10.5)

## 2010-06-24 ENCOUNTER — Other Ambulatory Visit: Payer: Self-pay | Admitting: *Deleted

## 2010-06-24 DIAGNOSIS — E785 Hyperlipidemia, unspecified: Secondary | ICD-10-CM

## 2010-06-24 MED ORDER — MEMANTINE HCL 10 MG PO TABS: 10.0000 mg | ORAL_TABLET | Freq: Two times a day (BID) | ORAL | Status: DC

## 2010-06-24 NOTE — Telephone Encounter (Signed)
Dashawn with [facility] called and left voice message requesting refill on Namenda. Her message stated the patient is no longer using mail orders to have her medications refilled. Rx refill sent to pharmacy

## 2010-07-01 ENCOUNTER — Other Ambulatory Visit: Payer: Self-pay | Admitting: Internal Medicine

## 2010-07-01 ENCOUNTER — Other Ambulatory Visit: Payer: 59

## 2010-07-01 DIAGNOSIS — E785 Hyperlipidemia, unspecified: Secondary | ICD-10-CM

## 2010-07-01 DIAGNOSIS — I10 Essential (primary) hypertension: Secondary | ICD-10-CM

## 2010-07-02 ENCOUNTER — Other Ambulatory Visit (INDEPENDENT_AMBULATORY_CARE_PROVIDER_SITE_OTHER): Payer: 59

## 2010-07-02 ENCOUNTER — Other Ambulatory Visit (INDEPENDENT_AMBULATORY_CARE_PROVIDER_SITE_OTHER): Payer: 59 | Admitting: Internal Medicine

## 2010-07-02 DIAGNOSIS — I10 Essential (primary) hypertension: Secondary | ICD-10-CM

## 2010-07-02 DIAGNOSIS — E785 Hyperlipidemia, unspecified: Secondary | ICD-10-CM

## 2010-07-02 LAB — HEPATIC FUNCTION PANEL
ALT: 25 U/L (ref 0–35)
AST: 25 U/L (ref 0–37)
Albumin: 3.8 g/dL (ref 3.5–5.2)
Alkaline Phosphatase: 67 U/L (ref 39–117)
Bilirubin, Direct: 0.1 mg/dL (ref 0.0–0.3)
Total Bilirubin: 0.8 mg/dL (ref 0.3–1.2)
Total Protein: 6.9 g/dL (ref 6.0–8.3)

## 2010-07-02 LAB — BASIC METABOLIC PANEL
CO2: 30 mEq/L (ref 19–32)
Calcium: 9.8 mg/dL (ref 8.4–10.5)
Creatinine, Ser: 1.2 mg/dL (ref 0.4–1.2)
Glucose, Bld: 132 mg/dL — ABNORMAL HIGH (ref 70–99)

## 2010-07-02 LAB — LDL CHOLESTEROL, DIRECT: Direct LDL: 88.7 mg/dL

## 2010-07-02 LAB — LIPID PANEL: Total CHOL/HDL Ratio: 3

## 2010-07-03 ENCOUNTER — Telehealth: Payer: Self-pay | Admitting: Internal Medicine

## 2010-07-03 MED ORDER — INSULIN GLARGINE 100 UNIT/ML ~~LOC~~ SOLN: 25.0000 [IU] | Freq: Every day | SUBCUTANEOUS | Status: DC

## 2010-07-03 NOTE — Telephone Encounter (Signed)
Refill sent to pharmacy.   

## 2010-07-03 NOTE — Telephone Encounter (Signed)
Per [facility] message:  Please send a new rx for lantus solostar.

## 2010-07-05 ENCOUNTER — Encounter: Payer: Self-pay | Admitting: Internal Medicine

## 2010-07-08 ENCOUNTER — Encounter: Payer: Self-pay | Admitting: Internal Medicine

## 2010-07-08 ENCOUNTER — Ambulatory Visit (INDEPENDENT_AMBULATORY_CARE_PROVIDER_SITE_OTHER): Payer: 59 | Admitting: Internal Medicine

## 2010-07-08 DIAGNOSIS — I2510 Atherosclerotic heart disease of native coronary artery without angina pectoris: Secondary | ICD-10-CM

## 2010-07-08 DIAGNOSIS — I10 Essential (primary) hypertension: Secondary | ICD-10-CM

## 2010-07-08 DIAGNOSIS — M25559 Pain in unspecified hip: Secondary | ICD-10-CM

## 2010-07-08 DIAGNOSIS — R197 Diarrhea, unspecified: Secondary | ICD-10-CM

## 2010-07-08 DIAGNOSIS — K219 Gastro-esophageal reflux disease without esophagitis: Secondary | ICD-10-CM

## 2010-07-08 DIAGNOSIS — E119 Type 2 diabetes mellitus without complications: Secondary | ICD-10-CM

## 2010-07-08 DIAGNOSIS — F068 Other specified mental disorders due to known physiological condition: Secondary | ICD-10-CM

## 2010-07-08 DIAGNOSIS — E785 Hyperlipidemia, unspecified: Secondary | ICD-10-CM

## 2010-07-08 MED ORDER — AMLODIPINE BESYLATE 10 MG PO TABS: 10.0000 mg | ORAL_TABLET | Freq: Every day | ORAL | Status: DC

## 2010-07-08 MED ORDER — FUROSEMIDE 20 MG PO TABS: 20.0000 mg | ORAL_TABLET | Freq: Every day | ORAL | Status: DC

## 2010-07-08 MED ORDER — DONEPEZIL HCL 10 MG PO TABS: 10.0000 mg | ORAL_TABLET | Freq: Every day | ORAL | Status: DC

## 2010-07-08 MED ORDER — ISOSORBIDE MONONITRATE ER 30 MG PO TB24: 30.0000 mg | ORAL_TABLET | Freq: Every day | ORAL | Status: DC

## 2010-07-08 MED ORDER — CLOPIDOGREL BISULFATE 75 MG PO TABS: 75.0000 mg | ORAL_TABLET | Freq: Every day | ORAL | Status: DC

## 2010-07-08 MED ORDER — QUINAPRIL HCL 40 MG PO TABS: 40.0000 mg | ORAL_TABLET | Freq: Every day | ORAL | Status: DC

## 2010-07-08 MED ORDER — MEMANTINE HCL 10 MG PO TABS: 10.0000 mg | ORAL_TABLET | Freq: Two times a day (BID) | ORAL | Status: DC

## 2010-07-08 MED ORDER — COLESEVELAM HCL 625 MG PO TABS: 1875.0000 mg | ORAL_TABLET | Freq: Two times a day (BID) | ORAL | Status: DC

## 2010-07-08 MED ORDER — PANTOPRAZOLE SODIUM 40 MG PO TBEC: 40.0000 mg | DELAYED_RELEASE_TABLET | Freq: Every day | ORAL | Status: DC

## 2010-07-08 MED ORDER — INSULIN GLARGINE 100 UNIT/ML ~~LOC~~ SOLN: 15.0000 [IU] | Freq: Every day | SUBCUTANEOUS | Status: DC

## 2010-07-08 MED ORDER — METOPROLOL SUCCINATE ER 50 MG PO TB24: 50.0000 mg | ORAL_TABLET | Freq: Every day | ORAL | Status: DC

## 2010-07-08 MED ORDER — INSULIN LISPRO 100 UNIT/ML ~~LOC~~ SOLN: 5.0000 [IU] | Freq: Three times a day (TID) | SUBCUTANEOUS | Status: DC

## 2010-07-08 MED ORDER — ROSUVASTATIN CALCIUM 20 MG PO TABS: 20.0000 mg | ORAL_TABLET | Freq: Every day | ORAL | Status: DC

## 2010-07-08 NOTE — Patient Instructions (Addendum)
Please complete the following lab tests before your next follow up appointment: BMET, A1c,  Urine microalb / cr ratio - 250.02 FLP, LFTs, TSH - 272.4

## 2010-07-09 ENCOUNTER — Telehealth: Payer: Self-pay | Admitting: Internal Medicine

## 2010-07-09 NOTE — Telephone Encounter (Signed)
Call placed to pharmacy at [phone] with Kianna, medications veirfied as prescribed to be correct. Medications will be filled as prescribed

## 2010-07-09 NOTE — Telephone Encounter (Signed)
Donepezil(aricept) 10mg  tablet. Take 1 tablet(10mg  total) by mouth daily. 1/2 tablet by mouth once daily. Qty 19. Written 4.24.12  Pharmacy comments: new medication for [place] here @ outpt pharmacy. Please confirm daily dose 5mg /10mg /or 15mg .    Metoprolol(toprol-xl) 50mg  24hr. Take 1 tablet (50mg  total) by mouth daily. 1/2 tablet by mouth once daily. Qty 2. Written 4.24.12  Pharmacy comments: please confirm toprol xl sig: 25mg /day, 50mg /day, or 75mg /day @ one time.

## 2010-07-10 ENCOUNTER — Ambulatory Visit: Payer: 59 | Admitting: Internal Medicine

## 2010-07-23 NOTE — Assessment & Plan Note (Signed)
I suspect hip bursitis.  She declines ortho referral for now. Use tylenol as needed

## 2010-07-23 NOTE — Assessment & Plan Note (Signed)
Diabetes control improved.   Encouraged regular use of meal time insulin. She discussed strategies to avoid hypoglycemia.  Lab Results  Component Value Date   HGBA1C 7.6* 07/02/2010

## 2010-07-23 NOTE — Assessment & Plan Note (Signed)
Well controlled BP: 130/80 mmHg

## 2010-07-23 NOTE — Progress Notes (Signed)
SUBJECTIVE: 77 y.o. female for follow up of diabetes. Diabetic Review of Systems - medication compliance: noncompliant some of the time, diabetic diet compliance: noncompliant some of the time, home glucose monitoring: is performed regularly.  Blood sugars controlled when she remembers to use meal time insulin consistently.    Pt also complains of intermittent right hip pain.  Worse with sleeping on left side.  Pt issues with wt bearing.  ROS: no polyuria or polydipsia, no chest pain, dyspnea or TIA's, no numbness, tingling or pain in extremities.     Past Medical History  Diagnosis Date  . Undiagnosed cardiac murmurs   . PVD (peripheral vascular disease)   . CAD (coronary artery disease)   . Hypertension   . Hypernatremia   . Hyperlipidemia   . Duodenal ulcer     acute hemorrhage  . Hyperkalemia   . Diarrhea   . Achlorhydria   . Iron deficiency anemia secondary to blood loss (chronic)   . Weakness   . Callus   . Amaurosis fugax   . Carotid bruit   . Diabetes mellitus type II   . Dementia   . Diabetic retinopathy     History   Social History  . Marital Status: Widowed    Spouse Name: N/A    Number of Children: N/A  . Years of Education: N/A   Occupational History  . Not on file.   Social History Main Topics  . Smoking status: Former Research scientist (life sciences)  . Smokeless tobacco: Not on file  . Alcohol Use: Not on file  . Drug Use: Not on file  . Sexually Active: Not on file   Other Topics Concern  . Not on file   Social History Narrative   Widowed - husband passed away in Feb 2011lives with daughter with epilepsy - mental retardationRetired  Former Smoker   Alcohol use-no       Occupation: Retired    son - Annalouise Orrantia     Past Surgical History  Procedure Date  . Coronary artery bypass graft 1999  . Abdominal hysterectomy     Family History  Problem Relation Age of Onset  . Coronary artery disease    . Hypertension    . Stroke      No Known Allergies  Current  Outpatient Prescriptions on File Prior to Visit  Medication Sig Dispense Refill  . aspirin 81 MG tablet Take 81 mg by mouth daily.        . Cholecalciferol (VITAMIN D) 1000 UNITS capsule Take 1,000 Units by mouth daily.        . Cyanocobalamin (VITAMIN B-12) 2500 MCG SUBL Place 2,500 mcg under the tongue daily.        . Ferrous Sulfate (IRON) 325 (65 FE) MG TABS Take 325 mg by mouth 2 (two) times daily.        . Glucosamine-Chondroit-Vit C-Mn (GLUCOSAMINE CHONDR 500 COMPLEX) CAPS Take 500 capsules by mouth daily.        . Insulin Pen Needle (B-D ULTRAFINE III SHORT PEN) 31G X 8 MM MISC by Does not apply route. Use 4 times daily for insulin injection       . Multiple Vitamins-Minerals (CENTRUM SILVER PO) Take by mouth daily.        . nitroGLYCERIN (NITROSTAT) 0.4 MG SL tablet Place 0.4 mg under the tongue every 5 (five) minutes as needed.        . Omega-3 Fatty Acids (FISH OIL) 1200 MG CAPS Take 1,200 mg by  mouth. 2 capsules by mouth  two times daily       . traMADol (ULTRAM) 50 MG tablet Take 50 mg by mouth. 1/2 to 1 tablet by mouth every 8 hours as needed for severe pain         BP 130/80  Pulse 56  Temp(Src) 98 F (36.7 C) (Oral)  Resp 18  Wt 165 lb (74.844 kg)  SpO2 100%   Physical exam:    Constitutional: Appears well-developed and well-nourished. No distress.  Cardiovascular: Normal rate, regular rhythm and normal heart sounds.  Exam reveals no gallop and no friction rub.   No murmur heard. Pulmonary/Chest: Effort normal and breath sounds normal.  No wheezes. No rales.  ANeurological: Alert. No cranial nerve deficit.  Skin: Skin is warm and dry.  Msk:  Pt right hip pain with int and ext rotation Psychiatric: Normal mood and affect. Behavior is normal.

## 2010-07-29 NOTE — Consult Note (Signed)
Destiny [name], [name]              ACCOUNT NO.:  [id number]   MEDICAL RECORD NO.:  [id number]          PATIENT TYPE:  INP   LOCATION:  4709                         FACILITY:  [facility]   PHYSICIAN:  Pramod P. Leonie Man, MD    DATE OF BIRTH:  [birth year]   DATE OF CONSULTATION:  08/12/2007  DATE OF DISCHARGE:                                 CONSULTATION   REFERRING PHYSICIAN:  Sandy Salaam. Shawna Orleans, DO   REASON FOR REFERRAL:  TIA.   HISTORY OF PRESENT ILLNESS:  Destiny [name] is a 77 year old Caucasian lady  who has had recurrent stereotypical episodes of transient vision loss  off and on since last one year.  Initially, the episodes were occurring  every day in the morning when she woke up she stated she could not see  and the vision was blurred in both eyes, though she is not sure of which  eye was affected.  This would last for a couple of hours and recover  after 6 months it stopped happening.  She did well until last Thursday.  Since then, she has had daily episodes of vision loss, which she states  this time was in the left eye only as she closed the either eye and  noticed that the left eye was affected.  It was painless and that lasted  3 to 4 hours on Thursday and a couple of hours on Friday.  She denied  any headache before, during, or after this.  There is no history of jaw  claudication, scalp tenderness, muscle aches, or pains.  She has no  other known prior history of stoke or TIAs.   PAST MEDICAL HISTORY:  Significant for diabetes, hypertension,  hyperlipidemia, and arthritis.   MEDICATIONS:  Metformin, glyburide, hydrochlorothiazide, Toprol,  quinapril, Plavix, Norvasc, Aricept, aspirin, Zocor, glucosamine,  Centrum Silver, iron 65 mg twice a day, Caltrate, and fish oil.   MEDICATIONS AND ALLERGIES:  None known.   PHYSICAL EXAMINATION:  GENERAL: Reveals a pleasant middle-aged Caucasian  lady who is at present not in any distress.  She is afebrile.  VITAL SIGNS:  Pulse 87 per minute  and regular, respiratory rate 16 per  minute.  Distal pulses well felt.  HEAD:  Head is nontraumatic.  NECK:  Supple.  There is no neck bruit.  ENT:  Unremarkable.  CARDIAC:  Regular heart sound.  LUNGS:  Clear to auscultation.  ABDOMEN:  Soft and nontender.  NEUROLOGIC:  She is awake and alert.  She is oriented to time, place and  person.  There is no aphasia, apraxia, or dysarthria.  She follows  commands well.  Eye movements are full range.  There is no nystagmus.  Visual acuity and fields seem adequate .  Tongue is midline.  Face is  symmetric.  MOTOR SYSTEM:  No upper extremity drift.  Symmetric strength.  All her  reflexes are easily elicitable except both ankle jerks are depressed.  Plantars are downgoing.  Her gait was not tested.   DATA REVIEWED:  Today, no recent imaging study.   LABORATORY DATA:  Admission H&P notes are available.  IMPRESSION:  A 77 year old lady with recurrent stereotypical episodes of  transient vision loss bilaterally and now more recently in the left eye.  This is of unclear significance.  I doubt this represents true transient  ischemic attack, given the frequency and long duration of the episodes  without having a definite stroke, which makes this less likely.  Temporal arteritis is a strong possibility given her age, but absence of  headache goes against it.   PLAN:  I will recommend to check an ESR, ANA panel C3, C4, and CH50.  I  will check MRI scan of the brain with the MRI of the brain and neck to  evaluate for carotid disease.  Check fasting lipid profile, hemoglobin  A1c, and homocysteine.  Continue aspirin and Plavix for now.  If no  significant extra or intracranial occlusive vascular disease is found,  we may need to consider doing temporal artery biopsy for temporal  arteritis and if this is confirmed we may need prednisone to be started.  I will be happy to follow the patient consults.  Kindly call for  questions.            ______________________________  Kathie Rhodes. Leonie Man, MD     PPS/MEDQ  D:  08/12/2007  T:  08/13/2007  Job:  [id number]

## 2010-07-29 NOTE — Assessment & Plan Note (Signed)
[facility]                            CARDIOLOGY OFFICE NOTE   Destiny [name]                     MRN:          [id number]  DATE:07/27/2007                            DOB:          28-Aug-1931    Destiny [name] returns today for followup.   She has distant history of coronary artery bypass surgery in 1999.  She  had a followup Myoview study in August 2008 which had normal LV function  and no significant ischemia.  EF was 71%.   She does get infrequent chest pain that she thinks is angina; she gets  this maybe once a month.  She takes a nitroglycerin for it and it goes  right away; this pattern has not changed.  She is able to walk well.  She has significant PVD.  Her last ABIs were done July 08, 2007 and  were 0.37 on the right and 0.46 on the left.  She appears to have  particularly significant SFA disease on the right. I have referred her  to Dr. Burt Knack, who she is seeing at the beginning of June.  In talking  to her, she has no nonhealing ulcers.  She really has minimal complaints  and I suspect that Dr. Burt Knack will advocate waiting, although a CTA may  be in order to rule out focal disease that can be angioplastied.   The patient has significant dementia; however, she was very easy to talk  to today and seemed to have a reasonable memory.  She drove herself  here.   REVIEW OF SYSTEMS:  Otherwise negative.   MEDICATIONS:  1. Glucophage 3 tablets nightly.  2. Hydrochlorothiazide 25 a day.  3. Iron.  4. Multivitamins.  5. Norvasc 5 mg a day.  6. Aricept 10 mg a day.  7. Zocor 20 mg a day.  8. An aspirin a day.  9. Caltrate.  10.Glyburide 5 a day.  11.Plavix 75 mg a day.  12.Metoprolol 25 a day.  13.Quinapril 10 a day.   PHYSICAL EXAMINATION:  The patient's exam is remarkable for an elderly  white female in no distress.  Weight is 155, blood pressure is 150/60, pulse 52 and regular, afebrile,  respiratory 14.  HEENT:   Unremarkable.  She has faint left carotid bruit, no lymphadenopathy, thyromegaly or JVP  elevation.  LUNGS:  Clear.  Good diaphragmatic motion.  No wheezing.  S1-S2 with normal heart sounds.  PMI normal.  ABDOMEN:  Protuberant.  No AAA.  No bruit.  No hepatosplenomegaly or  hepatojugular reflux.  EXTREMITIES:  She has bilateral femoral bruits.  She has a +1 dorsalis  pedis on the right and difficult to palpate on the left.  She has  elevation rubor bilaterally.  There are no nonhealing ulcers.   EKG:  Shows sinus rhythm with nonspecific ST-T wave changes.   IMPRESSION:  1. Coronary disease, old coronary artery bypass graft, infrequent      angina, low-risk Myoview, August 2008.  Continue to treat      medically.  2. Peripheral vascular disease, fairly low ankle-brachial indices, but  symptoms not that bad.  Follow up with Dr. Burt Knack.  I still think      it may be reasonable to do a computed tomographic angiography to      make sure there is no focal disease in the right superficial      femoral artery that could be improved.  3. Dementia currently appears stable.  Continue Aricept.  4. Hypercholesteremia in the setting of vascular disease.  Continue      Zocor.  Lipid and liver profile in 6 months.  5. Diabetes.  Follow up with primary care doctor.  Hemoglobin A1c      quarterly.  Continue oral hypoglycemics.  6. Faint left carotid bruit, recent carotid duplex done July 08, 2007      showing 40% to 59% bilateral disease.  Follow up in a year.      Continue aspirin and Plavix.   I will see the patient back in 6 months and look forward to Dr. Antionette Char  consult.     Wallis Bamberg. Johnsie Cancel, MD, [facility]  Electronically Signed    PCN/MedQ  DD: 07/27/2007  DT: 07/27/2007  Job #: [id number]

## 2010-07-29 NOTE — Discharge Summary (Signed)
NAMESHANDI, Destiny [name] NO.:  [id number]   MEDICAL RECORD NO.:  [id number]          PATIENT TYPE:  INP   LOCATION:  4707                         FACILITY:  [facility]   PHYSICIAN:  Valerie A. Asa Lente, MDDATE OF BIRTH:  Mar 24, 1931   DATE OF ADMISSION:  02/21/2008  DATE OF DISCHARGE:  02/24/2008                               DISCHARGE SUMMARY   DISCHARGE DIAGNOSES:  1. Melena/upper gastrointestinal bleed with acute blood loss anemia in      the setting of a duodenal ulcer/erosive gastritis status post HD,      February 22, 2008.  We plan to continue proton pump inhibitor and      avoid NSAIDs.  2. Question of diverticulitis on CT scans, will be treated with a 7-      day course of Cipro and Flagyl.  3. History of coronary artery disease.  Aspirin and Plavix on hold for      1 week secondary to upper gastrointestinal bleed.  4. Hypertension.  Blood pressure currently stable on beta-blocker      only, other medications on hold, will need reevaluation and      followup appointment next week.  5. Dyslipidemia.  Continue home statin.  6. History of dementia, currently stable on Aricept and Namenda.  7. Diabetes type 2.  CBGs remained stable.  We will resume home oral      medicines.  This patient is now tolerating a regular diet.  8. Question of diverticulitis on CT.  Continue Cipro and Flagyl x7      days total.  9. Weakness/fall with left eighth and ninth rib fracture prior to      admission.  Physical Therapy recommends Home Health physical      therapy for home safety evaluation.  Weakness is likely exacerbated      by anemia.   HISTORY OF PRESENT ILLNESS:  Ms. Ketcham is a 77 year old female who was  admitted on February 21, 2008 following a fall.  She also noted some  black stools and abdominal pain with nausea.  She apparently initially  presented to the [facility] Emergency Department where they  obtained a chest x-ray that showed eighth and ninth rib fractures.   They  gave her some ibuprofen and sent her home.  Since that time, she has had  worsening nausea, although she denied vomiting.  She felt more  lightheadedness and her stools looked more black in color.  She was also  having new onset stool incontinence.  She was admitted for further  evaluation and treatment.   PAST MEDICAL HISTORY:  1. History of coronary artery disease.  2. History of CVA.  3. Hypertension.  4. High cholesterol.  5. Peripheral vascular disease.  6. History of dementia.  7. Diabetes.   COURSE OF HOSPITALIZATION:  1. Acute blood loss anemia secondary to upper GI bleed.  The patient      was admitted.  Her aspirin and Plavix were held.  She received 2      units of blood on admission for hemoglobin of 8.1.  At the time of  discharge, she remains hemodynamically stable with a hemoglobin      10.3.  She was seen in consultation by [facility], Dr. Silvano Rusk      and underwent an endoscopy which revealed an ulcer in the      bulb/descending duodenum with erosions in the antrum.  It was      suspected that her ulcer and gastritis were due to NSAIDs.  Her H.      pylori antibody was negative.  Biopsies were pending at the time of      this dictation.  She was treated with a proton pump inhibitor.  At      this time, his recommendation is that she could be placed back on      aspirin 81 mg if needed, but Plavix should be held for 1-2 weeks if      possible.  Of note, the patient was held on her home med, both      diflunisal 500 mg p.o. b.i.d. as well as ibuprofen, which she had      started taking.  These have been discontinued.  2. Question of underlying diverticulitis.  CT abdomen and pelvis      performed on admission noted sigmoid diverticulitis.  She did have      some left lower quadrant pain at that time and was treated with IV      Cipro and Flagyl.  Dr. Carlean Purl recommended a 7-day total course.      He was not completely convinced that the patient truly  has      underlying diverticulitis.  She is currently tolerating p.o.'s      without difficulty and hemodynamically stable.  We plan for      discharge to home this afternoon.  She is set up for follow up with      [facility] in next week with Dr. Drema Pry.  She will need a      followup hemoglobin and hematocrit drawn at that appointment.   DISCHARGE MEDICATIONS:  1. Amlodipine 5 mg p.o. daily, currently on hold.  2. Aricept 10 mg p.o. daily.  3. Aspirin 81 mg 4 pills p.o. daily.  This is to be held for 4 more      days and then resumed at 81 mg per GI recommendations.  4. Centrum Silver 1 tablet p.o. daily.  5. Coenzyme Q10 50 mg p.o. daily.  6. Crestor 20 mg p.o. daily.  7. Fish oil 1200 mg 2 tablets p.o. b.i.d.  8. Glucosamine 1 tablet p.o. daily.  9. Glyburide 5 mg p.o. b.i.d.  10.Hydrochlorothiazide 12.5 mg p.o. daily, currently on hold.  11.Iron 325 mg p.o. b.i.d.  12.Protonix 40 mg p.o. daily.  13.Cipro 500 mg p.o. b.i.d. x4 days.  14.Flagyl 500 mg p.o. t.i.d. x4 days.  15.Metformin 500 mg p.o. 3 times daily.  16.Namenda 10 mg p.o. b.i.d.  17.Plavix 75 mg p.o. daily to be held until March 06, 2008, then      restart.  18.Quinapril 20 mg p.o. daily, currently on hold.  19.Toprol-XL 25 mg p.o. daily.  20.Vitamin B12 250 mg p.o. daily.   PERTINENT DISCHARGE LABORATORY DATA:  Hemoglobin 10.3 and hematocrit  29.5.   DISPOSITION:  She will be discharged to home.   FOLLOWUP:  She is to follow up with Dr. Drema Pry on February 28, 2008  at 9:15 a.m., with Dr. Silvano Rusk on March 23, 2008 at 2:30 p.m.  She  is instructed  to call Dr. Shawna Orleans or go to the ER should she develop dark  tar-like  stools, bright red blood in the stool or vomiting of coffee-ground  emesis or should she develop fever greater than 101.  She is instructed  to remain off anti-inflammatory medications.   TIME SPENT:  Greater than 30 minutes were spent on discharge planning.      Debbrah Alar, NP      Jannifer Rodney. Asa Lente, MD  Electronically Signed    MO/MEDQ  D:  02/24/2008  T:  02/24/2008  Job:  [id number]   cc:   Sandy Salaam. Shawna Orleans, DO  Gatha Mayer, MD,FACG

## 2010-07-29 NOTE — Assessment & Plan Note (Signed)
[facility]                            CARDIOLOGY OFFICE NOTE   Destiny [name], Destiny [name]                     MRN:          [id number]  DATE:09/27/2006                            DOB:          Dec 10, 1931    Destiny [name] returns today for followup.  She has had a bit of a time of it  lately.  Her daughter who has epilepsy and is somewhat retarded lost her  job.  She is trying to get her to move back in with her.  Her husband  continues to have advanced dementia, he is a patient of mine as well.  He has had previous aortic valve replacement.   She has a history of distant coronary bypass surgery in 1999, she is due  to have a followup Myoview in August of this year.  She does get  occasional chest pain maybe once every 2-3 months.  She takes a single  nitroglycerin for it and it goes away, this has been a very stable  pattern.  She tends to be a bit bradycardic.  She has not had any  significant palpitations, presyncope, or falls.  When I last saw her,  her heart rate was 49; today it is about 48.  We have maintained her on  low dose Toprol at 50 mg a day and Norvasc 5 mg a day for her blood  pressure.  She knows to call me if she were to have any signs of a  slower heart rate and we could stop her Toprol and see how she does.   She is a diabetic, her hemoglobin A1c's have been in a good range.  She  has been compliant with her blood pressure medications.   REVIEW OF SYSTEMS:  Otherwise negative.   CURRENT MEDICATIONS:  1. Glucophage three 500 mg tablets at night.  2. Accupril 40 a day.  3. Hydrochlorothiazide 25 a day.  4. Iron.  5. Toprol 50 a day.  6. Norvasc 5 a day.  7. Aricept 10 a day.  8. Aspirin a day.  9. Caltrate.   EXAMINATION:  Remarkable for a weight of 159.  She is afebrile.  Respiratory rate is 14, blood pressure is 150/60, pulse is 48-62.  HEENT:  Normal.  Carotids normal without bruit, there is no  lymphadenopathy, no JVP elevation,  neck is supple.  There is no  thyromegaly.  LUNGS:  Clear with good diaphragmatic motion, no wheezing.  There is a S1-S2 with a soft systolic murmur.  PMI is normal.  She is  status post sternotomy.  ABDOMEN:  Benign, bowel sounds are positive.  There is no tenderness, no  hepatosplenomegaly, no hepatojugular reflux, no bruits.  Distal pulses are intact with no edema.  NEURO:  Nonfocal.  There is no muscular weakness.   IMPRESSION:  1. Coronary disease, previous coronary artery bypass grafting in 1999.      Infrequent angina, continue medical therapy, followup Myoview in      August.  2. Hypertension, currently fairly well-controlled.  Continue Accupril      and current dose of diuretic.  Patient  to monitor blood pressure at      home from time to time.  Continue low-salt diet.  3. Relative bradycardia, no signs of syncope.  Continue low dose      Toprol, may need to stop this in the future, the patient will call      me if she has any concerns about this or any presyncope.  4. Hypercholesterolemia with coronary disease, continue Zocor 20 mg a      day, followup lipid and liver profile when she has her Myoview in      August.  5. Dementia.  Unfortunately she has some dementia.  Her husband's is      advanced.  She is the only on who can help her daughter who has      epilepsy and mental retardation.  The whole scenario at home is      somewhat tenuous and fortunately Destiny [name] continues to be      functional.  She will continue her Aricept at 10 a day.  6. Diabetes.  Continue oral hypoglycemic, no need for insulin.      Hemoglobin A1c is in a good range, follow up with primary care MD.   Her electrocardiogram today showed sinus bradycardia with nonspecific ST-  T wave changes and no high grade heart block.  She did have an  incomplete right bundle branch block which is old.     Wallis Bamberg. Johnsie Cancel, MD, [facility]  Electronically Signed    PCN/MedQ  DD: 09/27/2006  DT: 09/27/2006  Job #:  [id number]

## 2010-07-29 NOTE — H&P (Signed)
Destiny [name], Destiny [name] NO.:  [id number]   MEDICAL RECORD NO.:  [id number]          PATIENT TYPE:  INP   LOCATION:  3312                         FACILITY:  [facility]   PHYSICIAN:  Farris Has, MDDATE OF BIRTH:  22-Jul-1931   DATE OF ADMISSION:  02/21/2008  DATE OF DISCHARGE:                              HISTORY & PHYSICAL   PRIMARY CARE Shawnta Schlegel:  Sandy Salaam. Shawna Orleans, DO.   CHIEF COMPLAINT:  Status post fall, black stools, and abdominal pain and  nausea.   The patient is a 77 year old female with history of coronary artery  disease, status post CABG, as well as history of multiple CVAs,  hypertension and hyperlipidemia, who had suffered 6 days ago a fall, it  was a mechanical fall.  She slipped and fell on her left chest.  She  presented to [facility] emergency department, where they obtained a  chest x-ray that showed the image that showed 8th and 9th rib fractures.  They gave her some ibuprofen and sent her home.  Since then she had been  having worsening nausea, no vomiting though, but starting to feel more  lightheaded and noticed that maybe her stools have turned more black.  Of note, she has been having trouble with occasional incontinence of her  stool.  She would stand at the kitchen sink and has stool dribbling,  liquid stool.  This was intermittent.  Sometimes she is able to go to  the bathroom.  She has no urinary incontinence.  She thinks that maybe  her stools are dark because she was taking iron pills.   REVIEW OF SYSTEMS:  No fevers, no chills.  There is some abdominal pain  that has been there for a few days now, mainly on the left side.  CT  scan repeat that today showed diverticulitis.  No chest pain.  No new  shortness of breath.  No fevers, no chills.  Otherwise review of systems  unremarkable.   PAST MEDICAL HISTORY:  1. Significant for history of coronary artery disease.  2. History of CVA.  3. History of hypertension.  4. High  cholesterol.  5. Peripheral vascular disease.  6. History of dementia.  7. Mild diabetes.   SOCIAL HISTORY:  The patient lives a home.  Does not smoke or drink.  She lives with her demented husband, whom she takes care of.  She has a  daughter with a broken neck in a wheelchair.  She is the main care  Jirah Rider for a number of people, and she is very concerned about them.   FAMILY HISTORY:  Noncontributory.   ALLERGIES:  She thinks she is allergic to IBUPROFEN because it has been  making her nauseous.   MEDICATIONS:  According to ED notes, the patient is taking:   1. Amlodipine 5 mg.  2. Aricept 10 mg daily.  3. Aspirin 325 mg a daily.  4. Centrum.  5. Enzyme CoQ-10.  6. Crestor 20 daily.  7. Diflunisal, which is a pain medicine, 500 mg twice a day.  8. Fish oil.  9. Glucosamine.  10.Glyburide 5 mg twice  a day.  11.Hydrochlorothiazide 12.5 mg once a day.  12.Iron 325 twice a day.  13.Metformin 500 three times a day.  14.Namenda 10 mg twice a day.  15.Plavix 75 mg once a day.  16.Quinapril 20 mg once a day.  17.Toprol XL 25 mg once a day.  18.Vitamin B12.   Of note, I am not quite sure why the patient takes quinapril.  The  patient is unsure of her medication list.   PHYSICAL EXAMINATION:  Temperature 98.1.  Initial blood pressure was  89/60, now 124/38, heart rate of 55, respirations 17, satting 100% on  room air.  The patient appears to be in no acute distress, lying comfortably in  bed.  HEAD:  Nontraumatic.  Somewhat dryish mucous membranes and overall  pallor noted.  LUNGS:  Clear to auscultation bilaterally.  HEART:  Regular rate and rhythm.  No murmurs appreciated.  ABDOMEN:  Soft, but there is a slight left lower quadrant tenderness.  LOWER EXTREMITIES:  Without clubbing, cyanosis or edema.  Strength 5/5 in all for extremities.  Cranial II-XII intact.   LABS:  White blood cell count 12.8, hemoglobin 8.1, sodium 142,  potassium 4.7, creatinine 1.4 with BUN  84.  Calcium 9.8.  UA showing 11-  20 white blood cells.  CT scan of the abdomen and pelvis showing left  8th and 9th rib fracture, sigmoid diverticulitis.  CT scan of the head  and neck otherwise unremarkable.  No evidence of spinal fracture noted  on both CT scan of the abdomen and pelvis and CT scan of the neck.   ASSESSMENT/PLAN:  This is a 77 year old female with history of coronary  artery disease, now presents with slight melena with Hemoccult-positive  stools, anemia worse than her baseline and urinary tract infection and  possibly diverticulitis as well.   1. I wonder if the patient has upper gastrointestinal bleed given      elevated BUN and melena, although stool may also appear dark      secondary to being on iron.  Would recommend GI consult in a.m.      For now we will watch in step-down.  Give IV fluids but avoid      overloading her.  We will start on Protonix again.  Would avoid any      NSAIDS.  2. Possibly diverticulitis.  We will cover with Flagyl plus Cipro for      now, hold off on feeding her.  3. Anemia secondary to #1.  We will transfuse 2 units and follow CBC.  4. History of coronary artery disease.  Currently no chest pain or      shortness of breath.  Would hold transfusion parameters of 10 for      hemoglobin.  Given history of coronary artery disease, we will also      just obtain baseline EKG and continue metoprolol and statin.  5. Question history of dementia.  Continue Aricept plus Namenda.  6. Elevated creatinine.  Wonder what her baseline is.  Per review of      records, the past creatinine was 1.  Likely wall improve with      gentle IV hydration.  The patient appears to be somewhat fluid-down      on currently.  7. Prophylaxis:  Protonix plus SCDs  8. Urinary tract infection.  We will cover with Cipro, await urine      culture.  9. The patient is a full code.   Dr. Gwendolyn Grant  to assume care in a.m.      Farris Has, MD   Electronically Signed     AVD/MEDQ  D:  02/21/2008  T:  02/21/2008  Job:  [id number]   cc:   Sandy Salaam. Shawna Orleans, DO

## 2010-07-29 NOTE — Assessment & Plan Note (Signed)
[facility]                            CARDIOLOGY OFFICE NOTE   Destiny, [name]                     MRN:          [id number]  DATE:01/16/2008                            DOB:          [birth year]    Ms. Destiny [name] returns today for followup.  Since I last saw her, she had a  consultation with Dr. Burt Knack.  I read through his note and reviewed her  cardiac CTA.  She is a diabetic with significant peripheral vascular  disease.  Her ABIs are less than 0.5.  Although, she has a distal  disease in her trifurcation vessels, she did have an evidence for focal  stenosis in the right popliteal artery.  I have to agree with Dr.  Burt Knack, currently she has no nonhealing ulcers and does not have  limiting claudication.  If these were developed in the future, I would  proceed with intervention of the popliteal artery on the right.   I talked to her at length today regarding wearing socks in her feet and  she has ordered some diabetic shoes.  She was quite flustered today.  She has a daughter who has schizophrenia and epilepsy and has been off  her medicines.  She may need to be committed.  Fortunately, her son is  helping her with this.  Her husband has Alzheimer disease, and in  general, she is under a lot of stress.  He has a distant history of  coronary artery bypass surgery.  She is not having any significant chest  pain.  She had a nonischemic Myoview on October 15, 2006, with a normal  EF.   She has a history of left carotid bruit in March 2009.  She had a  carotid duplex.  I have reviewed on this exam as well.  The patient had  peak systolic velocity in the mid ICA on the left to 1.47 cm/sec with a  diastolic velocity of 39.  This equated to 40-59% bilateral ICA  stenosis.  Her MICA velocity was 1.27 on the right with a diastolic  velocity of 43.   She needs a followup duplex in April.  She has not had any TIA-like  symptoms.  She is taking an aspirin a  day.   Otherwise, her review of systems is negative.   She has no known allergies.   CURRENT MEDICATIONS:  1. Hydrochlorothiazide.  She has currently had her hydrochlorothiazide      stopped.  2. Norvasc 5 mg a day.  3. Aricept 10 a day.  4. Metoprolol 25 a day.  5. Glyburide 5 a day.  6. Quinapril 20 a day.  7. Plavix 75 a day.  8. Aspirin a day.  9. Crestor 20 a day.  10.Metformin 500 t.i.d.  11.Iron.  12.Namenda 10 b.i.d.  13.Fish oil.  14.B12.  15.Coenzyme Q.   PHYSICAL EXAMINATION:  GENERAL:  Remarkable for an elderly white female  in no distress.  VITAL SIGNS:  Weight is 156, blood pressure 130/57, pulse 52 and  regular, respiratory rate 14, afebrile.  HEENT:  Unremarkable.  NECK:  Left carotid  bruit.  No lymphadenopathy, thyromegaly, or JVP  elevation.  LUNGS:  Clear.  Good diaphragmatic motion.  No wheezing.  HEART:  S1 and S2 with a soft, systolic murmur.  PMI normal.  ABDOMEN:  Benign.  Bowel sounds positive.  No AAA, no tenderness, no  bruit, no hepatosplenomegaly, or hepatojugular reflux.  EXTREMITIES:  Bilateral femoral bruits.  Popliteals are +1 bilaterally.  Peripheral pulses are hard to palpate.  There is dependent rubor.  NEUROLOGIC:  Nonfocal.  SKIN:  Warm and dry.  MUSCULOSKELETAL:  No muscular weakness.   IMPRESSION:  1. Coronary artery disease with distant history of coronary artery      bypass graft.  Continue aspirin and beta-blocker.  2. Left carotid bruit.  Follow up carotid duplex April 2010.  Monitor      for signs of transient ischemic attack.  3. Peripheral vascular disease significant with probably focal right      popliteal disease.  Follow up with Dr. Burt Knack in 6 months.      Currently, no indications for Pletal or intervention.  Continue      walking program.  4. Diabetes with peripheral neuropathy.  Hemoglobin A1c quarterly.      Follow up primary care MD.  5. Soft, systolic murmur.  Last echocardiogram has been over 3 years       ago.  However, it sounds like aortic sclerosis and second heart      sound is well preserved.  We will continue to monitor for      worsening.  Consider echo in 2 years.  6. Apparent memory issues with dementia.  The patient seems quite      clear today.  She is on Aricept and Namenda, but clearly function      is better than most people in her family.  Her stress levels are      high due to her daughter's illness and her husband's Alzheimer      disease.  Hopefully, her other kids can help out.  7. Hypertension, currently well controlled.  We will continue current      dose of diuretic and quinapril, low-sodium diet.   I will see her back in April to go over a carotid duplex.     Destiny [name]. Johnsie Cancel, MD, [facility]  Electronically Signed    PCN/MedQ  DD: 01/16/2008  DT: 01/17/2008  Job #: [id number]

## 2010-07-29 NOTE — Discharge Summary (Signed)
NAMELORRINE, WILLOCKS NO.:  [id number]   MEDICAL RECORD NO.:  [id number]          PATIENT TYPE:  INP   LOCATION:  [id number]                         FACILITY:  [facility]   PHYSICIAN:  Kathlene November, MD           DATE OF BIRTH:  Sep 27, 1931   DATE OF ADMISSION:  08/12/2007  DATE OF DISCHARGE:  08/14/2007                               DISCHARGE SUMMARY   BRIEF HISTORY AND PHYSICAL:  Mrs. Stansfield is a 77 year old white female  with history of diabetes, heart disease, and peripheral artery disease  who presented to her primary care doctor with left-sided vision loss.  The symptoms started few days prior to admission and lasted several  hours.  She was seen by an ophthalmologist prior to admission who noted  evidence of retinal ischemia.  She denies other neurological complaints.   PHYSICAL EXAMINATION:  VITAL SIGNS:  Upon admission, her blood pressure  was 147/60, pulse rate 46.  She was afebrile.  GENERAL:  She was alert in known apparent distress.  LUNGS:  Clear to auscultation bilaterally.  CARDIOVASCULAR:  Regular rate and rhythm without murmur.  ABDOMEN:  Benign.  NEUROLOGIC:  She was alert and oriented.  Cranial nerves were intact.  Gait was normal.   LABORATORY DATA AND X-RAYS:  CBC showed a white count of 7.2 with a  hemoglobin of 11.6 and platelets of 200.  Potassium 4.9, creatinine  1.09.  Sed rate was 18.  Anemia panel showed normal iron and B12.  C3  and C4 compliment were normal.  Prior to admission, she had a carotid  ultrasound that showed bilateral disease 40%-59%.  At the hospital, she  had a MRI and MRA of the brain and neck and it showed no acute  intracranial abnormalities but advanced chronic ischemic disease.  The  MRA of the neck showed bilateral disease, left greater than right.  The  left internal carotid artery is 45-50% stenosed.  The MRA of the brain  showed wide spread vascular disease.   HOSPITAL COURSE:  The patient was admitted to the hospital.   The stroke  team was consulted.  Their impression was that her symptoms were of  unclear significance and that temporary artery biopsy needed to be  pursued if no vascular disease was found on the MRI/MRA.  Her sed rate,  however, was normal and she does have widespread vascular disease.  The  first night she spent in the hospital she did report some blurred vision  like a shadow over the left eye for several hours and the second night  in the hospital she reports no visual changes.  The case was discussed  with the neurologist on-call.  They felt that she was stable to go home  with close neurological followup.  The patient is feeling well,  asymptomatic, and feels ready to go home.  The only changes that were  done during this admission is to increase her aspirin from 81 to 325 mg  as well as change her from simvastatin to Crestor for better control of  her cholesterol.  At  this point, she will be discharged with the  following instructions.  1. Aspirin 325 one a day.  2. Plavix 75 mg one a day.  3. Crestor 20 mg 1 tablet a day.  4. Metformin 500 mg one p.o. t.i.d.  5. Glyburide 5 mg one p.o. b.i.d.  6. Toprol XL 50 half tablet daily.  7. Genpril 40 mg half tablet daily.  8. Amlodipine 5 mg one p.o. daily.  9. Iron 325 one p.o. b.i.d.  10.Aricept 10 mg one p.o. daily.  11.Namenda as directed.  12.Continue to take her glucosamine, Centrum Silver, B12 supplements,      and CoQ10 as before.  She also needs to continue with her fish oil.   ADMITTING DIAGNOSIS:  Amaurosis fugax.   DISCHARGE DIAGNOSES:  1. Amaurosis fugax, question of transient ischemic attack.  Needs      close neurological followup.  2. Hypertension, well controlled, some bradycardia noted during this      admission.  3. High cholesterol, now on Crestor.  4. Peripheral vascular disease.  5. History of dementia, on Aricept.  6. Diabetes.      Kathlene November, MD  Electronically Signed     JP/MEDQ  D:   08/14/2007  T:  08/14/2007  Job:  [id number]   cc:   Pramod P. Leonie Man, MD

## 2010-08-01 NOTE — Progress Notes (Signed)
[facility]                        PERIPHERAL VASCULAR OFFICE NOTE   ROMINA, PULIS                     MRN:          [id number]  DATE:08/29/2007                            DOB:          [birth year]    REASON FOR CONSULTATION:  Lower extremity peripheral arterial disease.   HISTORY OF PRESENT ILLNESS:  Ms. Lime is a 77 year old woman with both  coronary and peripheral arterial disease.  She had an abnormal pulse  exam, and this led to a lower extremity duplex scan with ABIs.  This was  performed on July 08, 2007, and showed an ABI of 0.37 on the right and  0.46 on the left.   IMAGING STUDIES:  Demonstrated only mild disease in the iliofemoral  system as well as the superficial femoral arteries.  There was  suggestion of severe below-knee disease bilaterally.   From the symptomatic standpoint, Ms. Ivers actually has only mild leg  symptoms.  She is able to walk at least one half of a mile without  stopping.  She has bilateral calf pain that affects both legs equally.  She has had no foot ulcers or nonhealing wounds.  She is bothered by  chronic low back pain.  She denies buttock or thigh claudication  symptoms.  She has had no prior event, no prior revascularization of her  legs.   MEDICATIONS:  1. Multivitamin daily.  2. Norvasc 5 mg daily.  3. Aricept 10 mg daily.  4. Metoprolol ER 25 mg daily.  5. Glyburide 5 mg daily.  6. Quinapril 20 mg daily.  7. Plavix 75 mg daily.  8. Aspirin 325 mg daily.  9. Crestor 20 mg daily.  10.Metformin 500 mg 3 times daily.  11.Iron 65 mg twice daily.  12.Namenda 5-10 mg twice daily.  13.Fish oil 1200 mg 4 daily.  14.B12 1 g daily.  15.Coenzyme Q10 50 mg daily.  16.Glucosamine 500 mg daily.   ALLERGIES:  NKDA.   PAST MEDICAL HISTORY:  1. Type 2 diabetes, treated with oral hypoglycemics.  2. Coronary artery disease, status post CABG in 1999 with mild chronic      stable angina.  3.  Peripheral arterial disease as outlined.  4. Dementia.  5. Hypercholesterolemia.  6. Hypertension.  7. Total hysterectomy.   SOCIAL HISTORY:  The patient is married, with 4 children.  She was a  smoker, but she quit in 2000.  She does not drink alcohol.  She does not  exercise.   FAMILY HISTORY:  There is a family history of coronary artery disease  and stroke.  The patient's mother died of heart failure at age 52.  Her  father died of stroke and complications of hypertension at a young age.   REVIEW OF SYSTEMS:  A 12-point review of systems was performed.  Pertinent positives included generalized fatigue, joint pains related to  arthritis, and occasional constipation.  All other systems were reviewed  and are negative except as detailed above.   PHYSICAL EXAMINATION:  GENERAL:  The patient is alert and oriented.  She  is in no acute distress.  She is a  pleasant woman.  VITAL SIGNS:  Weight is 155 pounds, blood pressure is 118/70, heart rate  is 49, and respiratory rate is 16.  HEENT:  Normal.  NECK:  Normal carotid upstrokes without bruits.  Jugular venous pressure  is normal.  No thyromegaly or thyroid nodules.  LUNGS:  Clear to auscultation bilaterally.  HEART:  The apex is discrete and nondisplaced.  The heart has regular  rate and rhythm.  There are no murmurs or gallops. ABDOMEN:  Soft and  nontender.  No organomegaly.  No bruits.  BACK:  No CVA tenderness.  EXTREMITIES:  Femoral pulses are 2+ without bruits. Dorsalis pedis  pulses are trace bilaterally.  Posterior tibial pulses are absent.  SKIN:  The feet are cool to touch.  There is no evidence of skin  breakdown.  There is no erythema or skin ulcers.  NEUROLOGIC:  Cranial nerves II through XII are intact.  Strength is  intact and equal bilaterally.   ASSESSMENT:  This is a 77 year old diabetic woman with lower extremity  peripheral arterial disease.  While her symptoms are mild, she does  appear to have significant  occlusive disease in her legs.  I elected to  have the patient undergo a CT angiogram of the aortoiliac region with  runoff to the feet to delineate her anatomy and see if there are options  for revascularization in the setting of her very low ankle-brachial  indices.  This study was completed on August 25, 2007, and in concordance  with the ultrasound, it showed only mild disease in the aortoiliac and  iliofemoral regions.  There is high-grade stenosis in the right  popliteal artery at the level of the anterior tibial origin.  There is  significant below-knee disease bilaterally with occlusion of both  anterior tibial arteries and primary runoff to the foot via a single  peroneal artery bilaterally.   In essence, Ms. Schalk has stenosis in the popliteal artery on the right  with significant below-knee disease bilaterally.  In the absence of  lifestyle-limiting claudication, I would recommend ongoing medical  therapy with less durable interventional results in the more distal  circulation as described.  If she develops signs of critical limb  ischemia or progressive claudication, then I will plan on proceeding  with angiography.  I would like to see Ms. Lull in followup in 6  months to  make sure that her leg symptoms are stable.  She remains on an excellent  medical program for her vascular disease under the care of Dr. Johnsie Cancel  and Dr. Shawna Orleans.     Juanda Bond. Burt Knack, MD  Electronically Signed    MDC/MedQ  DD: 08/29/2007  DT: 08/29/2007  Job #: [id number]   cc:   Wallis Bamberg. Johnsie Cancel, MD, [facility]  Sandy Salaam. Shawna Orleans, DO

## 2010-08-01 NOTE — Assessment & Plan Note (Signed)
[facility]                              CARDIOLOGY OFFICE NOTE   Destiny [name], Destiny [name]                     MRN:          [id number]  DATE:10/05/2005                            DOB:          [birth year]    HISTORY:  Destiny [name] returns today for followup.  I saw her and her husband  today.   She has had a previous CABG, I believe in 1999, by Dr. Prescott Gum.  She used  to see Dr. Pernell Dupre but wanted to switch over along with her husband.  She  had a Cardiolite in 2004, which was nonischemic with mean of 72%.  Apparently, there was a question of some TIAs.  She has seen Dr. Doy Mince  recently.  I do not have records on this.  She is fairly vague about the  workup for this.  As far as I can tell, she has not had a history of PAF or  carotid disease.   She appears to be a little bit more confused today.   She is on aspirin and Plavix but not Coumadin.   I also noticed that she was started on Aricept by Dr. Doy Mince.   From a cardiac standpoint, she is doing well but she has not had a stress  test in over three and a half years.  I told her it would be reasonable  given that her bypasses are 75 years old to have a followup adenosine  Myoview.   EXAMINATION:  Pulse 52 and regular.  Lungs are clear.  Carotids are normal.  There is normal S1 and S2.  Normal heart sounds.  Abdomen is benign.  Lower  extremities have intact pulses.  No edema.   LABORATORY DATA:  EKG shows sinus rhythm with RSR prime in V1.   IMPRESSION:  Stable, bypass grafts from 1999.  Follow up with adenosine  Myoview.  Continue current medications.  May need to cut back on Toprol in  the future since she is relatively bradycardic.  Continue aspirin and  Plavix.                               Wallis Bamberg. Johnsie Cancel, MD, [facility]    PCN/MedQ  DD:  10/05/2005  DT:  10/05/2005  Job #:  [id number]

## 2010-08-01 NOTE — Assessment & Plan Note (Signed)
[facility]                            CARDIOLOGY OFFICE NOTE   TESIA, BOSTON                     MRN:          [id number]  DATE:03/25/2006                            DOB:          02/13/1932    Destiny [name] returns today for follow up.  She is status post previous  cabbage in 1999.  She had a Myoview in August of 2007, which was  nonischemic with an EF 69%.   I reviewed her Myoview and it showed breast attenuation to my eye.   When I last saw the patient I was concerned about increasing dementia.  She has not followed up with Dr. Doy Mince.  She is on Aricept 10 mg a  day.  She continues to have a poor memory on interview.  In regards to  her heart she continues on aspirin and Plavix.   She was on this prior to her seeing me as a patient.  She used to see  Dr. Tamala Julian.  We have not gotten the records to see why the addition of  Plavix was indicated.   The patient is no longer on Coumadin.   There has been a question in the past of TIAs in regard to her dementia,  and I suspect that this is why she has been placed on Plavix.   REVIEW OF SYSTEMS:  Remarkable for no significant chest pain, PND or  orthopnea.   MEDICATIONS:  1. Glimepiride 20 mg b.i.d.  2. Glucophage 1500 at night.  3. Accupril 40 mg a day.  4. Plavix 75 mg a day.  5. Hydrochlorothiazide 25 mg a day.  6. Metoprolol 50 mg a day.  7. Norvasc 5 mg a day.  8. Aricept 10 mg a day.  9. Baby aspirin a day.  10.Vitamin D.   PHYSICAL EXAMINATION:  VITAL SIGNS:  Blood pressure 130/63, pulse 57 and  regular.  HEENT:  Normal.  Carotids have no bruit.  LUNGS:  Clear.  HEART:  Normal S1, S2.  Normal heart sounds.  ABDOMEN:  Benign.  LOWER EXTREMITY:  Intact pulses.  No edema.   IMPRESSION:  Previous coronary artery bypass grafting in 1999, good left  ventricular function, no recurrent angina.  Continue tight risk factor  modification.  Dr. Kenton Kingfisher is following her sugars, she tells  me her  hemoglobin A1c has been in the 6-7 range.  Her blood pressure is well  controlled.   Will continue her Zocor for her hyperlipidemia.  She will need follow up  liver function tests in about 6 months.   In regards to her graft patency we will continue her aspirin and Plavix.   She will need a follow up Myoview in August of 2008, otherwise I think  that she is doing well and I will see her husband later today, who  apparently was in the emergency room last night.     Wallis Bamberg. Johnsie Cancel, MD, [facility]  Electronically Signed    PCN/MedQ  DD: 03/25/2006  DT: 03/25/2006  Job #: [id number]

## 2010-08-13 LAB — HM DIABETES EYE EXAM

## 2010-08-15 ENCOUNTER — Encounter: Payer: Self-pay | Admitting: Internal Medicine

## 2010-09-29 ENCOUNTER — Other Ambulatory Visit: Payer: Self-pay | Admitting: Internal Medicine

## 2010-09-29 ENCOUNTER — Other Ambulatory Visit: Payer: 59

## 2010-09-29 DIAGNOSIS — E785 Hyperlipidemia, unspecified: Secondary | ICD-10-CM

## 2010-10-02 ENCOUNTER — Other Ambulatory Visit: Payer: Self-pay | Admitting: Internal Medicine

## 2010-10-02 ENCOUNTER — Other Ambulatory Visit (INDEPENDENT_AMBULATORY_CARE_PROVIDER_SITE_OTHER): Payer: 59

## 2010-10-02 DIAGNOSIS — IMO0001 Reserved for inherently not codable concepts without codable children: Secondary | ICD-10-CM

## 2010-10-02 DIAGNOSIS — E785 Hyperlipidemia, unspecified: Secondary | ICD-10-CM

## 2010-10-02 LAB — LIPID PANEL
HDL: 55.4 mg/dL (ref >39.00)
VLDL: 52.6 mg/dL — ABNORMAL HIGH (ref 0.0–40.0)

## 2010-10-02 LAB — BASIC METABOLIC PANEL
CO2: 29 mEq/L (ref 19–32)
Calcium: 9.6 mg/dL (ref 8.4–10.5)
Creatinine, Ser: 1.4 mg/dL — ABNORMAL HIGH (ref 0.4–1.2)

## 2010-10-02 LAB — LDL CHOLESTEROL, DIRECT: Direct LDL: 91.9 mg/dL

## 2010-10-02 LAB — MICROALBUMIN / CREATININE URINE RATIO: Creatinine,U: 145.9 mg/dL

## 2010-10-02 LAB — HEPATIC FUNCTION PANEL
ALT: 23 U/L (ref 0–35)
Bilirubin, Direct: 0.1 mg/dL (ref 0.0–0.3)
Total Bilirubin: 0.9 mg/dL (ref 0.3–1.2)

## 2010-10-06 ENCOUNTER — Ambulatory Visit (INDEPENDENT_AMBULATORY_CARE_PROVIDER_SITE_OTHER)
Admission: RE | Admit: 2010-10-06 | Discharge: 2010-10-06 | Disposition: A | Discharge status: Home / Self Care | Payer: Medicare PPO | Source: Ambulatory Visit | Attending: Internal Medicine | Admitting: Internal Medicine

## 2010-10-06 ENCOUNTER — Encounter: Payer: Self-pay | Admitting: Internal Medicine

## 2010-10-06 ENCOUNTER — Ambulatory Visit (INDEPENDENT_AMBULATORY_CARE_PROVIDER_SITE_OTHER): Payer: 59 | Admitting: Internal Medicine

## 2010-10-06 ENCOUNTER — Ambulatory Visit (HOSPITAL_BASED_OUTPATIENT_CLINIC_OR_DEPARTMENT_OTHER)
Admission: RE | Admit: 2010-10-06 | Discharge: 2010-10-06 | Disposition: A | Discharge status: Home / Self Care | Payer: Medicare PPO | Source: Ambulatory Visit | Attending: Internal Medicine | Admitting: Internal Medicine

## 2010-10-06 DIAGNOSIS — M79604 Pain in right leg: Secondary | ICD-10-CM

## 2010-10-06 DIAGNOSIS — E119 Type 2 diabetes mellitus without complications: Secondary | ICD-10-CM

## 2010-10-06 DIAGNOSIS — M79609 Pain in unspecified limb: Secondary | ICD-10-CM

## 2010-10-06 DIAGNOSIS — M25559 Pain in unspecified hip: Secondary | ICD-10-CM

## 2010-10-06 DIAGNOSIS — M1710 Unilateral primary osteoarthritis, unspecified knee: Secondary | ICD-10-CM

## 2010-10-06 DIAGNOSIS — M2340 Loose body in knee, unspecified knee: Secondary | ICD-10-CM

## 2010-10-06 DIAGNOSIS — M25569 Pain in unspecified knee: Secondary | ICD-10-CM | POA: Insufficient documentation

## 2010-10-06 DIAGNOSIS — IMO0002 Reserved for concepts with insufficient information to code with codable children: Secondary | ICD-10-CM | POA: Insufficient documentation

## 2010-10-06 MED ORDER — TRAMADOL HCL 50 MG PO TABS: ORAL_TABLET | ORAL | Status: AC

## 2010-10-06 NOTE — Progress Notes (Signed)
  Subjective:    Patient ID: Destiny [name], female    DOB: 1931/07/22, 77 y.o.   MRN: [id number]  HPI Pt presents to clinic for followup of DM and evaluation of leg pain. Notes several month h/o right leg pain located primarily along greater trochanter and knee. No injury/trauma. Upper leg pain worsened with lying on that side but not reproducible. Severity worsened x last 36 hours. No falls or instability. Avoiding nsaids due to h/o pud. Not taking medication currently for this problem. Has taken tramadol in the past and doesn't recall adverse effects. Diabetes under poor control with am fasting glucose typically 300. States taking lantus 20 units qhs and humalog.  No other complaints.  Reviewed past medical history, medications and allergies.  Review of Systems see hpi     Objective:   Physical Exam  Nursing note and vitals reviewed. Constitutional: She appears well-developed and well-nourished. No distress.  HENT:  Head: Normocephalic and atraumatic.  Right Ear: External ear normal.  Left Ear: External ear normal.  Eyes: Conjunctivae are normal. No scleral icterus.  Cardiovascular: Normal rate, regular rhythm and normal heart sounds.  Exam reveals no gallop and no friction rub.   No murmur heard. Pulmonary/Chest: Effort normal and breath sounds normal. No respiratory distress. She has no wheezes. She has no rales.  Musculoskeletal:       Full range of motion in right hip and right knee. No erythema warmth or effusion. Nontender.  Neurological: She is alert.  Skin: Skin is warm and dry. She is not diaphoretic.  Psychiatric: She has a normal mood and affect.          Assessment & Plan:

## 2010-10-06 NOTE — Patient Instructions (Signed)
Please increase lantus insulin to 30 units at night. Then you may increase then dose 3 units every 3 days until your morning fasting blood sugars are less than 130 without dropping low (less than 70.)

## 2010-10-07 ENCOUNTER — Ambulatory Visit: Payer: 59 | Admitting: Internal Medicine

## 2010-10-08 ENCOUNTER — Other Ambulatory Visit: Payer: Self-pay | Admitting: Internal Medicine

## 2010-10-08 DIAGNOSIS — M79606 Pain in leg, unspecified: Secondary | ICD-10-CM

## 2010-10-11 DIAGNOSIS — M79604 Pain in right leg: Secondary | ICD-10-CM | POA: Insufficient documentation

## 2010-10-11 NOTE — Assessment & Plan Note (Signed)
suboptimal control. Increase Lantus 30 units q.h.s. Then titrate dose 3 units every 3 days until fasting blood sugars consistently less than 130 without hypoglycemia. Dosing recommendations reviewed with patient and family member

## 2010-10-11 NOTE — Assessment & Plan Note (Signed)
Avoid anti-inflammatories. Attempt Ultram p.r.n. Obtain plain radiographs of knee and hip. Consider orthopedics if symptoms persist

## 2010-10-20 ENCOUNTER — Encounter: Payer: Self-pay | Admitting: Internal Medicine

## 2010-10-20 ENCOUNTER — Ambulatory Visit (INDEPENDENT_AMBULATORY_CARE_PROVIDER_SITE_OTHER): Payer: 59 | Admitting: Internal Medicine

## 2010-10-20 DIAGNOSIS — M79604 Pain in right leg: Secondary | ICD-10-CM

## 2010-10-20 DIAGNOSIS — E119 Type 2 diabetes mellitus without complications: Secondary | ICD-10-CM

## 2010-10-20 DIAGNOSIS — M79609 Pain in unspecified limb: Secondary | ICD-10-CM

## 2010-10-20 NOTE — Progress Notes (Signed)
  Subjective:    Patient ID: Destiny [name], female    DOB: March 01, 1932, 77 y.o.   MRN: [id number]  HPI Pt presents to clinic for f/u of multiple medical problems. Currently taking lantus 30 units qhs and notes fsbs avg ~200. Range is 110-235 without hypoglycemia. BP reviewed as nl. Right leg pain persisted after last visit s/p plain radiograph and ultram prn. Referred to orthopedics and states was told was from a slipped disc in her back. Given flexeril and ibuprofen. H/o CRI and PUD. Pain is improved but sometimes worsens with activity. No other complaints.  Reviewed pmh, medications and allergies.    Review of Systems see hpi     Objective:   Physical Exam  Nursing note and vitals reviewed. Constitutional: She appears well-developed and well-nourished. No distress.  HENT:  Head: Normocephalic and atraumatic.  Eyes: Conjunctivae are normal. No scleral icterus.  Musculoskeletal: Normal range of motion.  Neurological: She is alert.  Skin: Skin is warm and dry. She is not diaphoretic.  Psychiatric: She has a normal mood and affect.          Assessment & Plan:

## 2010-10-20 NOTE — Patient Instructions (Addendum)
Please increase lantus to 40 units at night. If your morning fasting sugars are still above 130 after a week then you can increase your lantus dose 3 units every 3 days until your morning sugars are less than 130.   Please call the clinic in 2 weeks with a blood sugar report  Please schedule cbc, chem7, a1c 250.0 prior to next visit

## 2010-10-20 NOTE — Assessment & Plan Note (Signed)
?   Radicular pain s/p orthopedics. Stop nsaid due to pud and cri hx.

## 2010-10-20 NOTE — Assessment & Plan Note (Signed)
Suboptimal control. Increase lantus to 40 units sq qhs. After one week if fsbs am fasting above 130 then instructions given for dose titration. Call fsbs log to clinic in two weeks and close followup scheduled.

## 2010-10-23 ENCOUNTER — Other Ambulatory Visit: Payer: Self-pay | Admitting: Internal Medicine

## 2010-10-23 DIAGNOSIS — E119 Type 2 diabetes mellitus without complications: Secondary | ICD-10-CM

## 2010-12-10 LAB — VITAMIN B12: Vitamin B-12: >2000 — ABNORMAL HIGH (ref 211–911)

## 2010-12-10 LAB — DIFFERENTIAL
Basophils Relative: 0
Lymphocytes Relative: 37
Lymphs Abs: 2.7
Monocytes Absolute: 0.6
Monocytes Relative: 8
Neutro Abs: 3.8
Neutrophils Relative %: 53

## 2010-12-10 LAB — BASIC METABOLIC PANEL
BUN: 31 — ABNORMAL HIGH
CO2: 27
Calcium: 10.2
Creatinine, Ser: 1.09
Glucose, Bld: 96
Sodium: 144

## 2010-12-10 LAB — FOLATE: Folate: >20

## 2010-12-10 LAB — CBC
Hemoglobin: 11.6 — ABNORMAL LOW
MCHC: 34.3
Platelets: 200
RDW: 13.5

## 2010-12-10 LAB — COMPLEMENT, TOTAL: Compl, Total (CH50): 58 U/mL (ref 31–66)

## 2010-12-10 LAB — RETICULOCYTES
RBC.: 3.7 — ABNORMAL LOW
Retic Ct Pct: 1.5

## 2010-12-10 LAB — C3 COMPLEMENT: C3 Complement: 131

## 2010-12-10 LAB — PROTIME-INR
INR: 1.1
Prothrombin Time: 14.5

## 2010-12-10 LAB — C4 COMPLEMENT: Complement C4, Body Fluid: 30

## 2010-12-10 LAB — IRON AND TIBC: Saturation Ratios: 21

## 2010-12-18 LAB — CBC
HCT: 25.9 % — ABNORMAL LOW (ref 36.0–46.0)
HCT: 29 % — ABNORMAL LOW (ref 36.0–46.0)
Hemoglobin: 8.8 g/dL — ABNORMAL LOW (ref 12.0–15.0)
MCHC: 34.5 g/dL (ref 30.0–36.0)
MCHC: 34.9 g/dL (ref 30.0–36.0)
MCV: 88.7 fL (ref 78.0–100.0)
MCV: 89.2 fL (ref 78.0–100.0)
MCV: 89.9 fL (ref 78.0–100.0)
MCV: 90.3 fL (ref 78.0–100.0)
Platelets: 203 10*3/uL (ref 150–400)
Platelets: 219 10*3/uL (ref 150–400)
Platelets: 223 10*3/uL (ref 150–400)
RBC: 3.26 MIL/uL — ABNORMAL LOW (ref 3.87–5.11)
RBC: 3.27 MIL/uL — ABNORMAL LOW (ref 3.87–5.11)
WBC: 8 10*3/uL (ref 4.0–10.5)
WBC: 8.4 10*3/uL (ref 4.0–10.5)
WBC: 8.9 10*3/uL (ref 4.0–10.5)
WBC: 9.7 10*3/uL (ref 4.0–10.5)

## 2010-12-18 LAB — GLUCOSE, CAPILLARY
Glucose-Capillary: 103 mg/dL — ABNORMAL HIGH (ref 70–99)
Glucose-Capillary: 112 mg/dL — ABNORMAL HIGH (ref 70–99)
Glucose-Capillary: 129 mg/dL — ABNORMAL HIGH (ref 70–99)
Glucose-Capillary: 132 mg/dL — ABNORMAL HIGH (ref 70–99)
Glucose-Capillary: 133 mg/dL — ABNORMAL HIGH (ref 70–99)
Glucose-Capillary: 162 mg/dL — ABNORMAL HIGH (ref 70–99)
Glucose-Capillary: 175 mg/dL — ABNORMAL HIGH (ref 70–99)
Glucose-Capillary: 180 mg/dL — ABNORMAL HIGH (ref 70–99)
Glucose-Capillary: 191 mg/dL — ABNORMAL HIGH (ref 70–99)
Glucose-Capillary: 70 mg/dL (ref 70–99)
Glucose-Capillary: 73 mg/dL (ref 70–99)
Glucose-Capillary: 81 mg/dL (ref 70–99)
Glucose-Capillary: 82 mg/dL (ref 70–99)

## 2010-12-18 LAB — BASIC METABOLIC PANEL
BUN: 23 mg/dL (ref 6–23)
BUN: 29 mg/dL — ABNORMAL HIGH (ref 6–23)
CO2: 25 mEq/L (ref 19–32)
Calcium: 9 mg/dL (ref 8.4–10.5)
Chloride: 106 mEq/L (ref 96–112)
Chloride: 109 mEq/L (ref 96–112)
Creatinine, Ser: 1.1 mg/dL (ref 0.4–1.2)
Creatinine, Ser: 1.13 mg/dL (ref 0.4–1.2)
GFR calc Af Amer: 57 mL/min — ABNORMAL LOW (ref >60)
GFR calc non Af Amer: 47 mL/min — ABNORMAL LOW (ref >60)
Glucose, Bld: 102 mg/dL — ABNORMAL HIGH (ref 70–99)
Potassium: 3.8 mEq/L (ref 3.5–5.1)

## 2010-12-18 LAB — CROSSMATCH: ABO/RH(D): A POS

## 2010-12-18 LAB — COMPREHENSIVE METABOLIC PANEL
Alkaline Phosphatase: 41 U/L (ref 39–117)
BUN: 58 mg/dL — ABNORMAL HIGH (ref 6–23)
CO2: 22 mEq/L (ref 19–32)
Chloride: 108 mEq/L (ref 96–112)
GFR calc non Af Amer: 37 mL/min — ABNORMAL LOW (ref >60)
Glucose, Bld: 115 mg/dL — ABNORMAL HIGH (ref 70–99)
Potassium: 3.9 mEq/L (ref 3.5–5.1)
Total Bilirubin: 1.6 mg/dL — ABNORMAL HIGH (ref 0.3–1.2)

## 2010-12-18 LAB — B-NATRIURETIC PEPTIDE (CONVERTED LAB): Pro B Natriuretic peptide (BNP): 56 pg/mL (ref 0.0–100.0)

## 2010-12-18 LAB — URINE CULTURE

## 2010-12-18 LAB — HEMOGLOBIN: Hemoglobin: 9.9 g/dL — ABNORMAL LOW (ref 12.0–15.0)

## 2010-12-18 LAB — CLOTEST (H. PYLORI), BIOPSY: Helicobacter screen: NEGATIVE

## 2010-12-19 LAB — GLUCOSE, CAPILLARY
Glucose-Capillary: 60 mg/dL — ABNORMAL LOW (ref 70–99)
Glucose-Capillary: 81 mg/dL (ref 70–99)

## 2010-12-19 LAB — BASIC METABOLIC PANEL
CO2: 22 mEq/L (ref 19–32)
Calcium: 9.8 mg/dL (ref 8.4–10.5)
Creatinine, Ser: 1.4 mg/dL — ABNORMAL HIGH (ref 0.4–1.2)
GFR calc non Af Amer: 37 mL/min — ABNORMAL LOW (ref >60)
Glucose, Bld: 115 mg/dL — ABNORMAL HIGH (ref 70–99)

## 2010-12-19 LAB — CBC
MCHC: 34.5 g/dL (ref 30.0–36.0)
Platelets: 259 10*3/uL (ref 150–400)
RDW: 13.8 % (ref 11.5–15.5)

## 2010-12-19 LAB — URINALYSIS, ROUTINE W REFLEX MICROSCOPIC
Bilirubin Urine: NEGATIVE
Nitrite: NEGATIVE
Specific Gravity, Urine: 1.018 (ref 1.005–1.030)
Urobilinogen, UA: 0.2 mg/dL (ref 0.0–1.0)

## 2010-12-19 LAB — DIFFERENTIAL
Basophils Absolute: 0.1 10*3/uL (ref 0.0–0.1)
Basophils Relative: 0 % (ref 0–1)
Lymphocytes Relative: 19 % (ref 12–46)
Neutro Abs: 9.3 10*3/uL — ABNORMAL HIGH (ref 1.7–7.7)
Neutrophils Relative %: 74 % (ref 43–77)

## 2010-12-19 LAB — URINE MICROSCOPIC-ADD ON

## 2010-12-22 ENCOUNTER — Encounter: Payer: Self-pay | Admitting: Neurology

## 2010-12-22 ENCOUNTER — Ambulatory Visit (INDEPENDENT_AMBULATORY_CARE_PROVIDER_SITE_OTHER): Payer: Medicare PPO | Admitting: Internal Medicine

## 2010-12-22 ENCOUNTER — Encounter: Payer: Self-pay | Admitting: Internal Medicine

## 2010-12-22 DIAGNOSIS — F0390 Unspecified dementia without behavioral disturbance: Secondary | ICD-10-CM

## 2010-12-22 DIAGNOSIS — E785 Hyperlipidemia, unspecified: Secondary | ICD-10-CM

## 2010-12-22 DIAGNOSIS — E119 Type 2 diabetes mellitus without complications: Secondary | ICD-10-CM

## 2010-12-22 DIAGNOSIS — Z794 Long term (current) use of insulin: Secondary | ICD-10-CM | POA: Insufficient documentation

## 2010-12-22 DIAGNOSIS — E538 Deficiency of other specified B group vitamins: Secondary | ICD-10-CM

## 2010-12-22 LAB — CBC
HCT: 33.9 % — ABNORMAL LOW (ref 36.0–46.0)
RBC: 3.76 MIL/uL — ABNORMAL LOW (ref 3.87–5.11)
RDW: 13.7 % (ref 11.5–15.5)
WBC: 7.8 10*3/uL (ref 4.0–10.5)

## 2010-12-22 LAB — VITAMIN B12: Vitamin B-12: 1918 pg/mL — ABNORMAL HIGH (ref 211–911)

## 2010-12-22 LAB — HEPATIC FUNCTION PANEL
AST: 19 U/L (ref 0–37)
Albumin: 4.8 g/dL (ref 3.5–5.2)
Alkaline Phosphatase: 63 U/L (ref 39–117)
Bilirubin, Direct: 0.2 mg/dL (ref 0.0–0.3)
Total Bilirubin: 1 mg/dL (ref 0.3–1.2)

## 2010-12-22 LAB — BASIC METABOLIC PANEL
BUN: 31 mg/dL — ABNORMAL HIGH (ref 6–23)
Creat: 1.38 mg/dL — ABNORMAL HIGH (ref 0.50–1.10)
Glucose, Bld: 74 mg/dL (ref 70–99)

## 2010-12-22 LAB — LIPID PANEL
HDL: 44 mg/dL (ref >39)
LDL Cholesterol: 75 mg/dL (ref 0–99)

## 2010-12-22 MED ORDER — NITROGLYCERIN 0.4 MG SL SUBL: 0.4000 mg | SUBLINGUAL_TABLET | SUBLINGUAL | Status: DC | PRN

## 2010-12-22 NOTE — Assessment & Plan Note (Signed)
Obtain b12 level.  

## 2010-12-22 NOTE — Patient Instructions (Signed)
Please take your insulin every day. Go back to taking your lantus 40 units a night and then you can increase the dose 3 units every 3 days unit your morning blood sugars are less than 130

## 2010-12-22 NOTE — Assessment & Plan Note (Signed)
Request neurology follow up

## 2010-12-22 NOTE — Assessment & Plan Note (Signed)
Obtain lipid/lft. 

## 2010-12-22 NOTE — Progress Notes (Signed)
  Subjective:    Patient ID: Destiny [name], female    DOB: 11-May-1931, 77 y.o.   MRN: [id number]  HPI Pt presents to clinic for followup of multiple medical problems. Notes chronic increased abdominal girth without abdominal pain, swelling or change in bowel habits. Initially states fsbs ~250 despite lantus titration to 60 units. Further discussion reveals misses doses of lantus an unspecified amount during the week and then that pt self reduced dose recently to 15 units qhs because had made dietary modifications. No hypoglycemia. Pt and family question need for aricept and namenda. States previously followed by neurology but not recently seen. No other complaints.   Past Medical History  Diagnosis Date  . Undiagnosed cardiac murmurs   . PVD (peripheral vascular disease)   . CAD (coronary artery disease)   . Hypertension   . Hypernatremia   . Hyperlipidemia   . Duodenal ulcer     acute hemorrhage  . Hyperkalemia   . Diarrhea   . Achlorhydria   . Iron deficiency anemia secondary to blood loss (chronic)   . Weakness   . Callus   . Amaurosis fugax   . Carotid bruit   . Diabetes mellitus type II   . Dementia   . Diabetic retinopathy    Past Surgical History  Procedure Date  . Coronary artery bypass graft 1999  . Abdominal hysterectomy     reports that she has quit smoking. She does not have any smokeless tobacco history on file. Her alcohol and drug histories not on file. family history includes Coronary artery disease in an unspecified family member; Hypertension in an unspecified family member; and Stroke in an unspecified family member. No Known Allergies   Review of Systems see hpi     Objective:   Physical Exam  Physical Exam  Nursing note and vitals reviewed. Constitutional: Appears well-developed and well-nourished. No distress.  HENT:  Head: Normocephalic and atraumatic.  Right Ear: External ear normal.  Left Ear: External ear normal.  Eyes: Conjunctivae are  normal. No scleral icterus.  Neck: Neck supple. Carotid bruit is not present.  Cardiovascular: Normal rate, regular rhythm and normal heart sounds.  Exam reveals no gallop and no friction rub.   No murmur heard. Pulmonary/Chest: Effort normal and breath sounds normal. No respiratory distress. He has no wheezes. no rales.  Lymphadenopathy:    He has no cervical adenopathy.  Neurological:Alert.  Skin: Skin is warm and dry. Not diaphoretic.  Psychiatric: Has a normal mood and affect.        Assessment & Plan:

## 2010-12-22 NOTE — Assessment & Plan Note (Signed)
suboptimal control complicated by noncompliance. Recommend resume lantus 40 units sq and titrate dose 3 units q 3 days until fasting fsbs <130 without hypoglycemia. Obtain cbc, chem7, a1c.

## 2010-12-23 LAB — HEMOGLOBIN A1C
Hgb A1c MFr Bld: 7.3 % — ABNORMAL HIGH (ref <5.7)
Mean Plasma Glucose: 163 mg/dL — ABNORMAL HIGH (ref <117)

## 2010-12-26 ENCOUNTER — Other Ambulatory Visit: Payer: Self-pay | Admitting: Cardiovascular Disease

## 2010-12-26 DIAGNOSIS — I6529 Occlusion and stenosis of unspecified carotid artery: Secondary | ICD-10-CM

## 2010-12-29 ENCOUNTER — Encounter (INDEPENDENT_AMBULATORY_CARE_PROVIDER_SITE_OTHER): Payer: Medicare PPO | Admitting: Cardiology

## 2010-12-29 ENCOUNTER — Other Ambulatory Visit: Payer: Self-pay | Admitting: Internal Medicine

## 2010-12-29 DIAGNOSIS — I6529 Occlusion and stenosis of unspecified carotid artery: Secondary | ICD-10-CM

## 2010-12-29 DIAGNOSIS — D649 Anemia, unspecified: Secondary | ICD-10-CM

## 2011-01-01 ENCOUNTER — Encounter: Payer: Self-pay | Admitting: Neurology

## 2011-01-01 ENCOUNTER — Ambulatory Visit (INDEPENDENT_AMBULATORY_CARE_PROVIDER_SITE_OTHER): Payer: Medicare PPO | Admitting: Neurology

## 2011-01-01 VITALS — BP 124/60 | HR 56 | Ht 65.25 in | Wt 162.0 lb

## 2011-01-01 DIAGNOSIS — R413 Other amnesia: Secondary | ICD-10-CM

## 2011-01-01 NOTE — Patient Instructions (Signed)
Your MRI and MRA's are scheduled for Thursday, October 25th at 7:00pm.  Please arrive to [facility] by 6:45pm.  We will call you with your appointment for the memory testing at [address] [place], [place]

## 2011-01-01 NOTE — Progress Notes (Signed)
Dear Dr. Elizebeth Koller,  Thank you for having me see Destiny [name] in consultation today at [facility] for her problem with possible dementia.  As you may recall, she is a 77 y.o. year old female with a history of "strokes" who presents with memory difficulties for several years.  She mainly has problems with peoples names, but only individuals she sees rarely.  She does not feel this is getting worse.  She does get anxious about driving places but is not getting lost.  She pays the bills and has not had problems with this.  She does not repeat herself and does not ask the same questions multiple times.  She denies hallucinations or significant problems with gait. She does have some word finding difficulties at times.  She denies depression although does cry about sad stories on tv.   Past Medical History  Diagnosis Date  . Undiagnosed cardiac murmurs   . PVD (peripheral vascular disease)   . CAD (coronary artery disease)   . Hypertension   . Hypernatremia   . Hyperlipidemia   . Duodenal ulcer     acute hemorrhage  . Hyperkalemia   . Diarrhea   . Achlorhydria   . Iron deficiency anemia secondary to blood loss (chronic)   . Weakness   . Callus   . Amaurosis fugax   . Carotid bruit   . Diabetes mellitus type II   . Dementia   . Diabetic retinopathy    - No history of seizures, intracranial infections, head trauma, but does have a history of "strokes", although she cannot really tell me how her strokes affected her.     Past Surgical History  Procedure Date  . Coronary artery bypass graft 1999  . Abdominal hysterectomy     History   Social History  . Marital Status: Widowed    Spouse Name: N/A    Number of Children: N/A  . Years of Education: N/A   Social History Main Topics  . Smoking status: Former Smoker    Quit date: 12/31/2000  . Smokeless tobacco: Never Used  . Alcohol Use: No  . Drug Use: None  . Sexually Active: None   Other Topics Concern  . None    Social History Narrative   Widowed - husband passed away in Feb 2011lives with daughter with epilepsy - mental retardationRetired  Former Smoker   Alcohol use-no       Occupation: Retired    son - Narmeen Tenbrink     Family History  Problem Relation Age of Onset  . Coronary artery disease    . Hypertension    . Stroke        ROS:  13 systems were reviewed and are notable for leg pain while walking.  All other review of systems are unremarkable.   Examination:  Filed Vitals:   01/01/11 0920  BP: 124/60  Pulse: 56  Height: 5' 5.25" (1.657 m)  Weight: 162 lb (73.483 kg)     In general, well appearing woman in NAD.  Cardiovascular: The patient has a regular rate and rhythm.  Fundoscopy:  Disks are flat.   Mental status:   MMSE 28/30 with 2 words lost in 3 word recall.  GDS was 3.  Cranial Nerves: Pupils are equally round and reactive to light. Visual fields full to confrontation. Extraocular movements are intact without nystagmus. Facial sensation and muscles of mastication are intact. Muscles of facial expression are symmetric. Hearing intact to bilateral finger rub. Tongue  protrusion, uvula, palate midline.  Shoulder shrug intact  Motor:  The patient has normal bulk and tone, no pronator drift.  There are no adventitious movements.  5/5 bilaterally.  Reflexes:   Biceps  Triceps Brachioradialis Knee Ankle  Right 2+  2+  2+   2+ 1+  Left  1+  2+  1+   2+ 2+  Toes equivocal.  Coordination:  Normal finger to nose.    Sensation is decreased distally in the feet to vibration and temp.  Gait and Station are normal.  Tandem gait is intact.  Romberg is negative  FRS: - PM, - glabellar, - snout  MRI brain was reviewed and revealed multiple lacunar infarctions in the basal ganglia particularly on the left.  Hippocampi look normal.  MRA head and neck reveals extensive intracranial atherosclerosis.  There was a 50% stenosis of the right ICA noted in 2009.   Impression:  I am unsure whether the patient has dementia at all.  She seems to be functioning very well.  It is possible she has minor cognitive impairment likely of a vascular cause.  I am more concerned about her diffuse intracranial disease and known right ICA stenosis.   Recommendations: I am going to get an MRI stroke protocol as well as neuropsych testing.  I have asked her to stop her Namenda for now to see if she notices any difference.  We will consider stopping her aricept based on her test results.   We will see the patient back in 3 months.  Thank you for having [place] see Destiny [name] in consultation.  Feel free to contact me with any questions.  Kavin Leech Jacelyn Grip, MD [facility], [place] [address], [place] [address] Phone: [phone] Fax: [phone].

## 2011-01-08 ENCOUNTER — Inpatient Hospital Stay (HOSPITAL_COMMUNITY): Admission: RE | Admit: 2011-01-08 | Payer: Medicare PPO | Source: Ambulatory Visit

## 2011-01-08 ENCOUNTER — Other Ambulatory Visit: Payer: Self-pay

## 2011-01-08 ENCOUNTER — Other Ambulatory Visit (HOSPITAL_COMMUNITY): Payer: Medicare PPO

## 2011-01-08 ENCOUNTER — Ambulatory Visit (HOSPITAL_COMMUNITY)
Admission: RE | Admit: 2011-01-08 | Discharge: 2011-01-08 | Disposition: A | Discharge status: Home / Self Care | Payer: Medicare PPO | Source: Ambulatory Visit | Attending: Neurology | Admitting: Neurology

## 2011-01-08 DIAGNOSIS — I672 Cerebral atherosclerosis: Secondary | ICD-10-CM | POA: Insufficient documentation

## 2011-01-08 DIAGNOSIS — I658 Occlusion and stenosis of other precerebral arteries: Secondary | ICD-10-CM | POA: Insufficient documentation

## 2011-01-08 DIAGNOSIS — Z8673 Personal history of transient ischemic attack (TIA), and cerebral infarction without residual deficits: Secondary | ICD-10-CM | POA: Insufficient documentation

## 2011-01-08 DIAGNOSIS — I6529 Occlusion and stenosis of unspecified carotid artery: Secondary | ICD-10-CM | POA: Insufficient documentation

## 2011-01-08 DIAGNOSIS — R413 Other amnesia: Secondary | ICD-10-CM | POA: Insufficient documentation

## 2011-01-14 NOTE — Progress Notes (Signed)
Spoke with pt and she will call [facility] to set up appt. As they have been trying to reach pt but unable to.

## 2011-01-29 ENCOUNTER — Ambulatory Visit (INDEPENDENT_AMBULATORY_CARE_PROVIDER_SITE_OTHER): Payer: Medicare PPO | Admitting: Internal Medicine

## 2011-01-29 ENCOUNTER — Encounter: Payer: Self-pay | Admitting: Internal Medicine

## 2011-01-29 VITALS — BP 126/60 | HR 49 | Temp 97.9°F | Resp 18 | Wt 163.0 lb

## 2011-01-29 DIAGNOSIS — E119 Type 2 diabetes mellitus without complications: Secondary | ICD-10-CM

## 2011-01-29 DIAGNOSIS — R159 Full incontinence of feces: Secondary | ICD-10-CM

## 2011-01-29 NOTE — Patient Instructions (Signed)
Please increase your lantus insulin 3 units every 3 days until your morning fasting sugars are less than 130. Schedule cbc(anemia), chem7, a1c 250.0 prior to next visit

## 2011-01-30 ENCOUNTER — Ambulatory Visit: Payer: Medicare PPO | Admitting: Internal Medicine

## 2011-01-30 ENCOUNTER — Ambulatory Visit: Payer: Medicare PPO | Admitting: Family

## 2011-01-30 ENCOUNTER — Encounter: Payer: Self-pay | Admitting: Gastroenterology

## 2011-02-02 ENCOUNTER — Telehealth: Payer: Self-pay | Admitting: Internal Medicine

## 2011-02-03 ENCOUNTER — Ambulatory Visit: Payer: Medicare PPO | Admitting: Gastroenterology

## 2011-02-03 ENCOUNTER — Encounter: Payer: Self-pay | Admitting: *Deleted

## 2011-02-03 ENCOUNTER — Telehealth: Payer: Self-pay | Admitting: *Deleted

## 2011-02-03 NOTE — Telephone Encounter (Signed)
error 

## 2011-02-03 NOTE — Telephone Encounter (Signed)
Pt scheduled to see Dr. Carlean Purl 02/09/11@9 :[id number]. Left message for [facility] with [place] regarding appt.

## 2011-02-03 NOTE — Telephone Encounter (Signed)
Error

## 2011-02-09 ENCOUNTER — Ambulatory Visit: Payer: Medicare PPO | Admitting: Internal Medicine

## 2011-02-09 ENCOUNTER — Ambulatory Visit (INDEPENDENT_AMBULATORY_CARE_PROVIDER_SITE_OTHER): Payer: Medicare PPO | Admitting: Internal Medicine

## 2011-02-09 ENCOUNTER — Encounter: Payer: Self-pay | Admitting: Internal Medicine

## 2011-02-09 VITALS — BP 112/60 | HR 69 | Temp 98.6°F | Resp 20

## 2011-02-09 DIAGNOSIS — J069 Acute upper respiratory infection, unspecified: Secondary | ICD-10-CM

## 2011-02-09 MED ORDER — AZITHROMYCIN 250 MG PO TABS: ORAL_TABLET | ORAL | Status: AC

## 2011-02-11 DIAGNOSIS — R159 Full incontinence of feces: Secondary | ICD-10-CM | POA: Insufficient documentation

## 2011-02-11 DIAGNOSIS — J069 Acute upper respiratory infection, unspecified: Secondary | ICD-10-CM | POA: Insufficient documentation

## 2011-02-11 NOTE — Progress Notes (Signed)
  Subjective:    Patient ID: Destiny [name], female    DOB: [birth year], 77 y.o.   MRN: [id number]  HPI Pt presents to clinic for followup of multiple medical problems. Diabetic control slowly improving. a1c down to 7.3 from 7.6 and 8.8 previously. No hypoglycemia. S/p neurology evaluation for dementia now.   Past Medical History  Diagnosis Date  . Undiagnosed cardiac murmurs   . PVD (peripheral vascular disease)   . CAD (coronary artery disease)   . Hypertension   . Hypernatremia   . Hyperlipidemia   . Duodenal ulcer     acute hemorrhage  . Hyperkalemia   . Diarrhea   . Achlorhydria   . Iron deficiency anemia secondary to blood loss (chronic)   . Weakness   . Callus   . Amaurosis fugax   . Carotid bruit   . Diabetes mellitus type II   . Dementia   . Diabetic retinopathy    Past Surgical History  Procedure Date  . Coronary artery bypass graft 1999  . Abdominal hysterectomy   . Upper gastrointestinal endoscopy 02/22/2008    w/biopsy, duedenal ulcer, antrum erosions    reports that she quit smoking about 10 years ago. She has never used smokeless tobacco. She reports that she does not drink alcohol. Her drug history not on file. family history includes Coronary artery disease in an unspecified family member; Hypertension in an unspecified family member; and Stroke in an unspecified family member. No Known Allergies   Review of Systems see hpi     Objective:   Physical Exam  Physical Exam  Nursing note and vitals reviewed. Constitutional: Appears well-developed and well-nourished. No distress.  HENT:  Head: Normocephalic and atraumatic.  Right Ear: External ear normal.  Left Ear: External ear normal.  Eyes: Conjunctivae are normal. No scleral icterus.  Neck: Neck supple. Carotid bruit is not present.  Cardiovascular: Normal rate, regular rhythm and normal heart sounds.  Exam reveals no gallop and no friction rub.   No murmur heard. Pulmonary/Chest: Effort normal and  breath sounds normal. No respiratory distress. He has no wheezes. no rales.  Lymphadenopathy:    He has no cervical adenopathy.  Neurological:Alert.  Skin: Skin is warm and dry. Not diaphoretic.  Psychiatric: Has a normal mood and affect.         Assessment & Plan:

## 2011-02-11 NOTE — Progress Notes (Addendum)
  Subjective:    Patient ID: Destiny [name], female    DOB: 09-Jun-1931, 77 y.o.   MRN: [id number]  HPI Pt presents to clinic for evaluation of cough. Notes 4-5d h/o NP cough, ST and ear pain. No ear drainage, f/c, hemoptysis. No alleviating or exacerbating factors. Taking otc medication without improvement. No other complaints.  Past Medical History  Diagnosis Date  . Undiagnosed cardiac murmurs   . PVD (peripheral vascular disease)   . CAD (coronary artery disease)   . Hypertension   . Hypernatremia   . Hyperlipidemia   . Duodenal ulcer     acute hemorrhage  . Hyperkalemia   . Diarrhea   . Achlorhydria   . Iron deficiency anemia secondary to blood loss (chronic)   . Weakness   . Callus   . Amaurosis fugax   . Carotid bruit   . Diabetes mellitus type II   . Dementia   . Diabetic retinopathy    Past Surgical History  Procedure Date  . Coronary artery bypass graft 1999  . Abdominal hysterectomy   . Upper gastrointestinal endoscopy 02/22/2008    w/biopsy, duedenal ulcer, antrum erosions    reports that she quit smoking about 10 years ago. She has never used smokeless tobacco. She reports that she does not drink alcohol. Her drug history not on file. family history includes Coronary artery disease in an unspecified family member; Hypertension in an unspecified family member; and Stroke in an unspecified family member. No Known Allergies   Review of Systems see hpi     Objective:   Physical Exam  Nursing note and vitals reviewed. Constitutional: She appears well-developed and well-nourished. No distress.  HENT:  Head: Normocephalic and atraumatic.  Right Ear: External ear normal.  Left Ear: External ear normal.  Nose: Nose normal.  Mouth/Throat: Oropharynx is clear and moist. No oropharyngeal exudate.  Eyes: Conjunctivae are normal. Right eye exhibits no discharge. Left eye exhibits no discharge. No scleral icterus.  Neck: Neck supple.  Cardiovascular: Normal rate,  regular rhythm and normal heart sounds.   Pulmonary/Chest: Effort normal and breath sounds normal.  Neurological: She is alert.  Skin: Skin is warm and dry. She is not diaphoretic.  Psychiatric: She has a normal mood and affect.          Assessment & Plan:

## 2011-02-11 NOTE — Progress Notes (Deleted)
  Subjective:    Patient ID: Destiny [name], female    DOB: 09/21/1931, 77 y.o.   MRN: [id number]  HPI    Review of Systems     Objective:   Physical Exam        Assessment & Plan:

## 2011-02-11 NOTE — Assessment & Plan Note (Signed)
Consider possible viral etiology currently. Given printed abx prescription. Begin if no improvement of sx's after total duration of 7-9 days. Followup if no improvement or worsening.

## 2011-02-11 NOTE — Assessment & Plan Note (Signed)
Improving control. Increase lantus 3 units q3 days until fasting am fsbs <130 without hypoglycemia.

## 2011-02-11 NOTE — Progress Notes (Deleted)
  Subjective:    Patient ID: Destiny [name], female    DOB: November 23, 1931, 77 y.o.   MRN: [id number]  HPI    Review of Systems     Objective:   Physical Exam        Assessment & Plan:

## 2011-02-11 NOTE — Assessment & Plan Note (Signed)
GI consult

## 2011-03-02 ENCOUNTER — Other Ambulatory Visit: Payer: Self-pay | Admitting: Internal Medicine

## 2011-03-02 NOTE — Telephone Encounter (Signed)
Rx refill sent to pharmacy. 

## 2011-03-24 ENCOUNTER — Telehealth: Payer: Self-pay | Admitting: Internal Medicine

## 2011-03-24 DIAGNOSIS — I2510 Atherosclerotic heart disease of native coronary artery without angina pectoris: Secondary | ICD-10-CM

## 2011-03-24 MED ORDER — ISOSORBIDE MONONITRATE ER 30 MG PO TB24: 30.0000 mg | ORAL_TABLET | Freq: Every day | ORAL | Status: DC

## 2011-03-24 MED ORDER — FUROSEMIDE 20 MG PO TABS: 20.0000 mg | ORAL_TABLET | Freq: Every day | ORAL | Status: DC

## 2011-03-24 NOTE — Telephone Encounter (Signed)
Rx refills sent to pharmacy. 

## 2011-03-24 NOTE — Telephone Encounter (Signed)
Refill furosemide 20 mg qty 90 last fill 01-29-11

## 2011-04-03 ENCOUNTER — Other Ambulatory Visit: Payer: Self-pay | Admitting: Internal Medicine

## 2011-04-03 NOTE — Telephone Encounter (Signed)
Rx refill sent to pharmacy. 

## 2011-04-23 ENCOUNTER — Telehealth: Payer: Self-pay | Admitting: Neurology

## 2011-04-23 ENCOUNTER — Telehealth: Payer: Self-pay | Admitting: *Deleted

## 2011-04-23 MED ORDER — INSULIN LISPRO 100 UNIT/ML ~~LOC~~ SOLN: 5.0000 [IU] | Freq: Three times a day (TID) | SUBCUTANEOUS | Status: DC

## 2011-04-23 NOTE — Telephone Encounter (Signed)
Received call from pt stating she needed a refill on her daytime insulin. I asked pt to verify the name of insulin she is requesting and she tells me it is Novolog flexpen. I asked pt how she was currently taking it and she states she takes 15-20 units every morning depending on her fasting blood sugar. EPIC record indicates pt should be taking Humalog Kwik pen 5-10 units three times a day before meals. I asked pt if she was confusing her Lantus dosing with the Novolog and she states that she takes 40-50 units of Lantus at bedtime. Pt states she is completely out of Novolog and needs refill today. Pt does not remember who last prescribed Novolog. After review of [facility], I see that pt's Novolog was discontinued on 04/08/10 and she was prescribed Humalog Kwikpen.  I spoke to Debbrah Alar, NP as Dr Elizebeth Koller is out of the office and she advises to refill Humalog kwik pen 5-10 units three times daily before meals as documented on pt's medication list. Notified pt that refill will be for Humalog Kwik pen and she should be taking 5-10 units three times daily before meals. Encouraged pt keep follow up on 05/01/11 with Dr Elizebeth Koller and have family member with her to discuss future dosing of both Lantus and Humalog. Refill sent to [facility] per pt request.

## 2011-04-23 NOTE — Telephone Encounter (Signed)
Called pt and advised her to call her pcp for refill of her insulin as we did not prescribe for her.

## 2011-04-23 NOTE — Telephone Encounter (Signed)
Pt states she needs "daytime insulin." Please return her call and advise at home number. Pt states she may not be home, but you can leave a message on her machine.

## 2011-05-01 ENCOUNTER — Ambulatory Visit: Payer: Medicare PPO | Admitting: Internal Medicine

## 2011-05-06 ENCOUNTER — Encounter: Payer: Self-pay | Admitting: Internal Medicine

## 2011-05-06 ENCOUNTER — Ambulatory Visit (INDEPENDENT_AMBULATORY_CARE_PROVIDER_SITE_OTHER): Payer: Medicare Other | Admitting: Internal Medicine

## 2011-05-06 DIAGNOSIS — E119 Type 2 diabetes mellitus without complications: Secondary | ICD-10-CM

## 2011-05-06 LAB — BASIC METABOLIC PANEL
CO2: 25 mEq/L (ref 19–32)
Glucose, Bld: 133 mg/dL — ABNORMAL HIGH (ref 70–99)
Potassium: 4.6 mEq/L (ref 3.5–5.3)
Sodium: 143 mEq/L (ref 135–145)

## 2011-05-06 LAB — CBC
HCT: 38.1 % (ref 36.0–46.0)
Hemoglobin: 12.4 g/dL (ref 12.0–15.0)
MCV: 89 fL (ref 78.0–100.0)
RDW: 14.3 % (ref 11.5–15.5)
WBC: 7.2 10*3/uL (ref 4.0–10.5)

## 2011-05-10 NOTE — Progress Notes (Signed)
  Subjective:    Patient ID: Destiny [name], female    DOB: 1931/09/25, 78 y.o.   MRN: [id number]  HPI Pt presents to clinic for followup of multiple medical problems. States is taking lantus 40-50 units and humalog 15-20 units intermittently. Admits misses supper dose and sometimes lunch dose. Review of glucometer history shows range betwenn 90 and 373 with predominance in 200 range. Does have dementia taking aricept and namenda. No active complaint.  Past Medical History  Diagnosis Date  . Undiagnosed cardiac murmurs   . PVD (peripheral vascular disease)   . CAD (coronary artery disease)   . Hypertension   . Hypernatremia   . Hyperlipidemia   . Duodenal ulcer     acute hemorrhage  . Hyperkalemia   . Diarrhea   . Achlorhydria   . Iron deficiency anemia secondary to blood loss (chronic)   . Weakness   . Callus   . Amaurosis fugax   . Carotid bruit   . Diabetes mellitus type II   . Dementia   . Diabetic retinopathy    Past Surgical History  Procedure Date  . Coronary artery bypass graft 1999  . Abdominal hysterectomy   . Upper gastrointestinal endoscopy 02/22/2008    w/biopsy, duedenal ulcer, antrum erosions    reports that she quit smoking about 10 years ago. She has never used smokeless tobacco. She reports that she does not drink alcohol. Her drug history not on file. family history includes Coronary artery disease in an unspecified family member; Hypertension in an unspecified family member; and Stroke in an unspecified family member. No Known Allergies    Review of Systems see hpi     Objective:   Physical Exam  Physical Exam  Nursing note and vitals reviewed. Constitutional: Appears well-developed and well-nourished. No distress.  HENT:  Head: Normocephalic and atraumatic.  Right Ear: External ear normal.  Left Ear: External ear normal.  Eyes: Conjunctivae are normal. No scleral icterus.  Neck: Neck supple. Carotid bruit is not present.  Cardiovascular:  Normal rate, regular rhythm and normal heart sounds.  Exam reveals no gallop and no friction rub.   No murmur heard. Pulmonary/Chest: Effort normal and breath sounds normal. No respiratory distress. He has no wheezes. no rales.  Lymphadenopathy:    He has no cervical adenopathy.  Neurological:Alert.  Skin: Skin is warm and dry. Not diaphoretic.  Psychiatric: Has a normal mood and affect.        Assessment & Plan:

## 2011-05-10 NOTE — Assessment & Plan Note (Signed)
Poor control complicated by noncompliance with medication. Suspect contribution from dementia. Reinforced need for regular insulin use with pt and son. States understanding. Also pursue diabetic diet and regular exercise. Close f/u in three weeks. Obtain cbc, chem7 a1c.

## 2011-05-11 ENCOUNTER — Other Ambulatory Visit: Payer: Self-pay | Admitting: Internal Medicine

## 2011-05-11 NOTE — Telephone Encounter (Signed)
Rx refill sent to pharmacy. 

## 2011-05-14 ENCOUNTER — Encounter (HOSPITAL_COMMUNITY): Payer: Self-pay | Admitting: *Deleted

## 2011-05-14 ENCOUNTER — Other Ambulatory Visit: Payer: Self-pay

## 2011-05-14 ENCOUNTER — Inpatient Hospital Stay (HOSPITAL_COMMUNITY)
Admission: EM | Admit: 2011-05-14 | Discharge: 2011-05-21 | DRG: 378 | Disposition: A | Discharge status: Home / Self Care | Payer: Medicare Other | Attending: Internal Medicine | Admitting: Internal Medicine

## 2011-05-14 ENCOUNTER — Emergency Department (HOSPITAL_COMMUNITY): Payer: Medicare Other

## 2011-05-14 ENCOUNTER — Telehealth: Payer: Self-pay | Admitting: Internal Medicine

## 2011-05-14 DIAGNOSIS — E875 Hyperkalemia: Secondary | ICD-10-CM

## 2011-05-14 DIAGNOSIS — N259 Disorder resulting from impaired renal tubular function, unspecified: Secondary | ICD-10-CM

## 2011-05-14 DIAGNOSIS — R079 Chest pain, unspecified: Secondary | ICD-10-CM | POA: Diagnosis present

## 2011-05-14 DIAGNOSIS — Z951 Presence of aortocoronary bypass graft: Secondary | ICD-10-CM

## 2011-05-14 DIAGNOSIS — L84 Corns and callosities: Secondary | ICD-10-CM

## 2011-05-14 DIAGNOSIS — F0390 Unspecified dementia without behavioral disturbance: Secondary | ICD-10-CM

## 2011-05-14 DIAGNOSIS — M79604 Pain in right leg: Secondary | ICD-10-CM

## 2011-05-14 DIAGNOSIS — Z87891 Personal history of nicotine dependence: Secondary | ICD-10-CM

## 2011-05-14 DIAGNOSIS — R0989 Other specified symptoms and signs involving the circulatory and respiratory systems: Secondary | ICD-10-CM

## 2011-05-14 DIAGNOSIS — D649 Anemia, unspecified: Secondary | ICD-10-CM

## 2011-05-14 DIAGNOSIS — M25559 Pain in unspecified hip: Secondary | ICD-10-CM

## 2011-05-14 DIAGNOSIS — E1149 Type 2 diabetes mellitus with other diabetic neurological complication: Secondary | ICD-10-CM

## 2011-05-14 DIAGNOSIS — K260 Acute duodenal ulcer with hemorrhage: Secondary | ICD-10-CM

## 2011-05-14 DIAGNOSIS — K648 Other hemorrhoids: Secondary | ICD-10-CM | POA: Diagnosis present

## 2011-05-14 DIAGNOSIS — E1139 Type 2 diabetes mellitus with other diabetic ophthalmic complication: Secondary | ICD-10-CM | POA: Diagnosis present

## 2011-05-14 DIAGNOSIS — I739 Peripheral vascular disease, unspecified: Secondary | ICD-10-CM

## 2011-05-14 DIAGNOSIS — K922 Gastrointestinal hemorrhage, unspecified: Principal | ICD-10-CM

## 2011-05-14 DIAGNOSIS — E785 Hyperlipidemia, unspecified: Secondary | ICD-10-CM

## 2011-05-14 DIAGNOSIS — E870 Hyperosmolality and hypernatremia: Secondary | ICD-10-CM

## 2011-05-14 DIAGNOSIS — I10 Essential (primary) hypertension: Secondary | ICD-10-CM

## 2011-05-14 DIAGNOSIS — I6350 Cerebral infarction due to unspecified occlusion or stenosis of unspecified cerebral artery: Secondary | ICD-10-CM

## 2011-05-14 DIAGNOSIS — F068 Other specified mental disorders due to known physiological condition: Secondary | ICD-10-CM

## 2011-05-14 DIAGNOSIS — D696 Thrombocytopenia, unspecified: Secondary | ICD-10-CM | POA: Diagnosis present

## 2011-05-14 DIAGNOSIS — R197 Diarrhea, unspecified: Secondary | ICD-10-CM

## 2011-05-14 DIAGNOSIS — J069 Acute upper respiratory infection, unspecified: Secondary | ICD-10-CM

## 2011-05-14 DIAGNOSIS — K3183 Achlorhydria: Secondary | ICD-10-CM

## 2011-05-14 DIAGNOSIS — K5730 Diverticulosis of large intestine without perforation or abscess without bleeding: Secondary | ICD-10-CM | POA: Diagnosis present

## 2011-05-14 DIAGNOSIS — Z7902 Long term (current) use of antithrombotics/antiplatelets: Secondary | ICD-10-CM

## 2011-05-14 DIAGNOSIS — E119 Type 2 diabetes mellitus without complications: Secondary | ICD-10-CM

## 2011-05-14 DIAGNOSIS — R159 Full incontinence of feces: Secondary | ICD-10-CM

## 2011-05-14 DIAGNOSIS — D62 Acute posthemorrhagic anemia: Secondary | ICD-10-CM

## 2011-05-14 DIAGNOSIS — N179 Acute kidney failure, unspecified: Secondary | ICD-10-CM | POA: Diagnosis present

## 2011-05-14 DIAGNOSIS — H3400 Transient retinal artery occlusion, unspecified eye: Secondary | ICD-10-CM

## 2011-05-14 DIAGNOSIS — R5381 Other malaise: Secondary | ICD-10-CM

## 2011-05-14 DIAGNOSIS — R011 Cardiac murmur, unspecified: Secondary | ICD-10-CM

## 2011-05-14 DIAGNOSIS — E538 Deficiency of other specified B group vitamins: Secondary | ICD-10-CM

## 2011-05-14 DIAGNOSIS — I2510 Atherosclerotic heart disease of native coronary artery without angina pectoris: Secondary | ICD-10-CM

## 2011-05-14 DIAGNOSIS — E11319 Type 2 diabetes mellitus with unspecified diabetic retinopathy without macular edema: Secondary | ICD-10-CM | POA: Diagnosis present

## 2011-05-14 DIAGNOSIS — Z794 Long term (current) use of insulin: Secondary | ICD-10-CM | POA: Diagnosis present

## 2011-05-14 HISTORY — DX: Calculus of gallbladder without cholecystitis without obstruction: K80.20

## 2011-05-14 LAB — COMPREHENSIVE METABOLIC PANEL
ALT: 13 U/L (ref 0–35)
AST: 13 U/L (ref 0–37)
Albumin: 3 g/dL — ABNORMAL LOW (ref 3.5–5.2)
Alkaline Phosphatase: 47 U/L (ref 39–117)
Potassium: 4 mEq/L (ref 3.5–5.1)
Sodium: 141 mEq/L (ref 135–145)
Total Protein: 5.9 g/dL — ABNORMAL LOW (ref 6.0–8.3)

## 2011-05-14 LAB — CBC
HCT: 20.9 % — ABNORMAL LOW (ref 36.0–46.0)
Hemoglobin: 7 g/dL — ABNORMAL LOW (ref 12.0–15.0)
MCV: 87.8 fL (ref 78.0–100.0)
RDW: 15 % (ref 11.5–15.5)
WBC: 10 10*3/uL (ref 4.0–10.5)

## 2011-05-14 LAB — DIFFERENTIAL
Basophils Relative: 0 % (ref 0–1)
Eosinophils Absolute: 0.2 10*3/uL (ref 0.0–0.7)
Neutro Abs: 6.4 10*3/uL (ref 1.7–7.7)
Neutrophils Relative %: 64 % (ref 43–77)

## 2011-05-14 MED ORDER — GLUCOSE BLOOD VI STRP: ORAL_STRIP | Status: DC

## 2011-05-14 NOTE — ED Notes (Signed)
MD at bedside. Dr. Pickering. 

## 2011-05-14 NOTE — Telephone Encounter (Signed)
Rx sent to pharmacy   

## 2011-05-14 NOTE — ED Provider Notes (Signed)
History     CSN: [id number]  Arrival date & time 05/14/11  2048   First MD Initiated Contact with Patient 05/14/11 2103      Chief Complaint  Patient presents with  . Chest Pain    (Consider location/radiation/quality/duration/timing/severity/associated sxs/prior treatment) Patient is a 78 y.o. female presenting with chest pain. The history is provided by the patient.  Chest Pain Primary symptoms include nausea. Pertinent negatives for primary symptoms include no shortness of breath, no abdominal pain and no vomiting.  Pertinent negatives for associated symptoms include no numbness and no weakness.    patient had an episode of lower chest pain tonight. She states it went across her chest into both arms. States is like pressure. She's had a previous bypass. She states this is not like her pain that she's had in the past. She took nitroglycerin it relieved her pain. She states she had similar episode last night. She states that last night she also had difficulty walking. She states that she would fall back and forth and hit the wall. No headache. She gets a mild nauseous this with the event. No fevers. No cough. No diarrhea. The lightheadedness or dizziness. She is pain-free now  Past Medical History  Diagnosis Date  . Undiagnosed cardiac murmurs   . PVD (peripheral vascular disease)   . CAD (coronary artery disease)   . Hypertension   . Hypernatremia   . Hyperlipidemia   . Duodenal ulcer     acute hemorrhage  . Hyperkalemia   . Diarrhea   . Achlorhydria   . Iron deficiency anemia secondary to blood loss (chronic)   . Weakness   . Callus   . Amaurosis fugax   . Carotid bruit   . Diabetes mellitus type II   . Dementia   . Diabetic retinopathy     Past Surgical History  Procedure Date  . Coronary artery bypass graft 1999  . Abdominal hysterectomy   . Upper gastrointestinal endoscopy 02/22/2008    w/biopsy, duedenal ulcer, antrum erosions    Family History  Problem  Relation Age of Onset  . Coronary artery disease    . Hypertension    . Stroke      History  Substance Use Topics  . Smoking status: Former Smoker    Quit date: 12/31/2000  . Smokeless tobacco: Never Used  . Alcohol Use: No    OB History    Grav Para Term Preterm Abortions TAB SAB Ect Mult Living                  Review of Systems  Constitutional: Negative for activity change and appetite change.  HENT: Negative for neck stiffness.   Eyes: Negative for pain.  Respiratory: Negative for chest tightness and shortness of breath.   Cardiovascular: Positive for chest pain. Negative for leg swelling.  Gastrointestinal: Positive for nausea and diarrhea. Negative for vomiting, abdominal pain and blood in stool.  Genitourinary: Negative for flank pain.  Musculoskeletal: Negative for back pain.  Skin: Negative for rash.  Neurological: Negative for weakness, numbness and headaches.  Psychiatric/Behavioral: Negative for behavioral problems.    Allergies  Review of patient's allergies indicates no known allergies.  Home Medications   Current Outpatient Rx  Name Route Sig Dispense Refill  . AMLODIPINE BESYLATE 10 MG PO TABS Oral Take 10 mg by mouth daily.    . ASPIRIN 81 MG PO CHEW Oral Chew 162 mg by mouth 2 (two) times daily.    Marland Kitchen  VITAMIN D 1000 UNITS PO CAPS Oral Take 1,000 Units by mouth daily.     Marland Kitchen CLOPIDOGREL BISULFATE 75 MG PO TABS Oral Take 75 mg by mouth daily.    . COLESEVELAM HCL 625 MG PO TABS Oral Take 625-1,875 mg by mouth daily.     Marland Kitchen VITAMIN B-12 2500 MCG SL SUBL Sublingual Place 2,500 mcg under the tongue daily.     . IRON 325 (65 FE) MG PO TABS Oral Take 325 mg by mouth 2 (two) times daily.     . FUROSEMIDE 20 MG PO TABS Oral Take 20 mg by mouth daily.    Marland Kitchen GLUCOSAMINE CHONDR 500 COMPLEX PO CAPS Oral Take 1 capsule by mouth daily.     Marland Kitchen GLUCOSE BLOOD VI STRP  Use as instructed to check blood sugar three times a day Dx. 250.00 300 each 6  . INSULIN GLARGINE 100  UNIT/ML [place] SOLN Subcutaneous Inject 50 Units into the skin at bedtime.     . INSULIN LISPRO (HUMAN) 100 UNIT/ML [place] SOLN Subcutaneous Inject 20 Units into the skin 3 (three) times daily before meals.     . ISOSORBIDE MONONITRATE ER 30 MG PO TB24 Oral Take 30 mg by mouth daily.    Marland Kitchen MEMANTINE HCL 10 MG PO TABS Oral Take 10 mg by mouth 2 (two) times daily.    Marland Kitchen METOPROLOL SUCCINATE ER 50 MG PO TB24 Oral Take 25 mg by mouth daily.    . CENTRUM SILVER PO Oral Take 1 tablet by mouth daily.     Marland Kitchen NITROGLYCERIN 0.4 MG SL SUBL Sublingual Place 0.4 mg under the tongue every 5 (five) minutes as needed. For chest pain    . FISH OIL 1200 MG PO CAPS Oral Take 2,400 mg by mouth 2 (two) times daily.     Marland Kitchen PANTOPRAZOLE SODIUM 40 MG PO TBEC Oral Take 40 mg by mouth daily.    . QUINAPRIL HCL 40 MG PO TABS Oral Take 40 mg by mouth at bedtime.    Marland Kitchen ROSUVASTATIN CALCIUM 20 MG PO TABS Oral Take 20 mg by mouth daily.    . INSULIN PEN NEEDLE 31G X 8 MM MISC Does not apply by Does not apply route. Use 4 times daily for insulin injection       BP 124/36  Pulse 56  Temp 98.2 F (36.8 C)  Resp 15  SpO2 99%  Physical Exam  Nursing note and vitals reviewed. Constitutional: She is oriented to person, place, and time. She appears well-developed and well-nourished.  HENT:  Head: Normocephalic and atraumatic.  Eyes: EOM are normal. Pupils are equal, round, and reactive to light.  Neck: Normal range of motion. Neck supple.  Cardiovascular: Normal rate, regular rhythm and normal heart sounds.   No murmur heard. Pulmonary/Chest: Effort normal and breath sounds normal. No respiratory distress. She has no wheezes. She has no rales.       Scar on chest from previous median sternotomy  Abdominal: Soft. Bowel sounds are normal. She exhibits no distension. There is no tenderness. There is no rebound and no guarding.  Musculoskeletal: Normal range of motion.  Neurological: She is alert and oriented to person, place, and time.  No cranial nerve deficit.  Skin: Skin is warm and dry.  Psychiatric: She has a normal mood and affect. Her speech is normal.    ED Course  Procedures (including critical care time)  Labs Reviewed  CBC - Abnormal; Notable for the following:    RBC  2.38 (*)    Hemoglobin 7.0 (*)    HCT 20.9 (*)    Platelets 135 (*)    All other components within normal limits  COMPREHENSIVE METABOLIC PANEL - Abnormal; Notable for the following:    Glucose, Bld 191 (*)    BUN 52 (*)    Creatinine, Ser 1.51 (*)    Total Protein 5.9 (*)    Albumin 3.0 (*)    GFR calc non Af Amer 32 (*)    GFR calc Af Amer 37 (*)    All other components within normal limits  DIFFERENTIAL  POCT I-STAT TROPONIN I  SAMPLE TO BLOOD BANK  OCCULT BLOOD, POC DEVICE  TYPE AND SCREEN  PREPARE RBC (CROSSMATCH)   Dg Chest 2 View  05/14/2011  *RADIOLOGY REPORT*  Clinical Data: Chest pain  CHEST - 2 VIEW  Comparison: None.  Findings: Cardiomegaly.  Previous CABG.  No infiltrates or failure. No effusion or pneumothorax.  Degenerative change thoracic spine.  IMPRESSION: Cardiomegaly with CABG, no active infiltrates.  Little change from priors.  Original Report Authenticated By: Staci Righter, M.D.   Ct Head Wo Contrast  05/14/2011  *RADIOLOGY REPORT*  Clinical Data: Unsteady gait with chest pain.  CT HEAD WITHOUT CONTRAST  Technique:  Contiguous axial images were obtained from the base of the skull through the vertex without contrast.  Comparison: MRI brain 01/08/2011.  Findings: Mild atrophy with chronic microvascular ischemic change. No acute stroke, acute hemorrhage, mass lesion, hydrocephalus, or extra-axial fluid.  Chronic infarctions of the left basal ganglia and right occipital lobe.  Vascular calcification.  Intact calvarium.  Clear sinuses and mastoids.  IMPRESSION: Atrophy and small vessel disease.  No acute intracranial findings.  Original Report Authenticated By: Staci Righter, M.D.     1. GIB (gastrointestinal  bleeding)   2. Anemia      Date: 05/14/2011  Rate: 63  Rhythm: normal sinus rhythm  QRS Axis: normal  Intervals: normal  ST/T Wave abnormalities: nonspecific ST/T changes  Conduction Disutrbances:none  Narrative Interpretation: Nonspecific ST changes laterally and inferiorly  Old EKG Reviewed: changes noted    MDM  As the patient presented with chest pain. Pressure. It was not like her previous chest pains. She took nitroglycerin without pain-free. Previous CABG. Patient has a hemoglobin of 7. She's quite positive. She is not hypotensive. Her unsteadiness at home may have been hypotension. She'll be admitted to medicine with a GI consult.        Jasper Riling. Alvino Chapel, MD 05/14/11 [phone]

## 2011-05-14 NOTE — ED Notes (Signed)
Patient states that around 1800 tonight she had episode of chest pain, midsternal, nonradiating, which she described as pressure, patient took ASA 325 mg at home, nitro x 2, patient now pain free, Patient with cardiac history and bypass x 4 vessels

## 2011-05-15 ENCOUNTER — Encounter (HOSPITAL_COMMUNITY): Payer: Self-pay | Admitting: Family

## 2011-05-15 DIAGNOSIS — K922 Gastrointestinal hemorrhage, unspecified: Principal | ICD-10-CM

## 2011-05-15 DIAGNOSIS — D62 Acute posthemorrhagic anemia: Secondary | ICD-10-CM

## 2011-05-15 LAB — COMPREHENSIVE METABOLIC PANEL
Albumin: 3 g/dL — ABNORMAL LOW (ref 3.5–5.2)
Alkaline Phosphatase: 46 U/L (ref 39–117)
BUN: 53 mg/dL — ABNORMAL HIGH (ref 6–23)
Calcium: 9 mg/dL (ref 8.4–10.5)
Creatinine, Ser: 1.4 mg/dL — ABNORMAL HIGH (ref 0.50–1.10)
GFR calc Af Amer: 41 mL/min — ABNORMAL LOW (ref >90)
Glucose, Bld: 176 mg/dL — ABNORMAL HIGH (ref 70–99)
Potassium: 4.4 mEq/L (ref 3.5–5.1)
Total Protein: 5.8 g/dL — ABNORMAL LOW (ref 6.0–8.3)

## 2011-05-15 LAB — PREPARE RBC (CROSSMATCH)

## 2011-05-15 LAB — GLUCOSE, CAPILLARY
Glucose-Capillary: 185 mg/dL — ABNORMAL HIGH (ref 70–99)
Glucose-Capillary: 161 mg/dL — ABNORMAL HIGH (ref 70–99)
Glucose-Capillary: 206 mg/dL — ABNORMAL HIGH (ref 70–99)

## 2011-05-15 LAB — CBC
MCH: 30 pg (ref 26.0–34.0)
MCHC: 34.5 g/dL (ref 30.0–36.0)
Platelets: 121 10*3/uL — ABNORMAL LOW (ref 150–400)
RDW: 15.2 % (ref 11.5–15.5)

## 2011-05-15 LAB — APTT: aPTT: 26 seconds (ref 24–37)

## 2011-05-15 MED ORDER — METOPROLOL TARTRATE 12.5 MG HALF TABLET
12.5000 mg | ORAL_TABLET | Freq: Two times a day (BID) | ORAL | Status: DC
  Administered 2011-05-15 – 2011-05-17 (×5): 12.5 mg via ORAL
  Filled 2011-05-15 (×8): qty 1

## 2011-05-15 MED ORDER — ONDANSETRON HCL 4 MG/2ML IJ SOLN: 4.0000 mg | Freq: Four times a day (QID) | INTRAMUSCULAR | Status: DC | PRN

## 2011-05-15 MED ORDER — MORPHINE SULFATE 2 MG/ML IJ SOLN
1.0000 mg | INTRAMUSCULAR | Status: DC | PRN
Start: 2011-05-15 — End: 2011-05-21

## 2011-05-15 MED ORDER — CHLORHEXIDINE GLUCONATE 0.12 % MT SOLN
15.0000 mL | Freq: Two times a day (BID) | OROMUCOSAL | Status: DC
  Administered 2011-05-15 – 2011-05-21 (×10): 15 mL via OROMUCOSAL
  Filled 2011-05-15 (×15): qty 15

## 2011-05-15 MED ORDER — INSULIN ASPART 100 UNIT/ML ~~LOC~~ SOLN
0.0000 [IU] | Freq: Every day | SUBCUTANEOUS | Status: DC
  Administered 2011-05-15: 2 [IU] via SUBCUTANEOUS

## 2011-05-15 MED ORDER — SODIUM CHLORIDE 0.9 % IV SOLN
INTRAVENOUS | Status: DC
  Administered 2011-05-16: 02:00:00 via INTRAVENOUS
  Administered 2011-05-16: 1000 mL via INTRAVENOUS

## 2011-05-15 MED ORDER — INSULIN ASPART 100 UNIT/ML ~~LOC~~ SOLN
0.0000 [IU] | Freq: Three times a day (TID) | SUBCUTANEOUS | Status: DC
  Administered 2011-05-15: 3 [IU] via SUBCUTANEOUS
  Administered 2011-05-16: 2 [IU] via SUBCUTANEOUS
  Administered 2011-05-16 (×2): 1 [IU] via SUBCUTANEOUS
  Administered 2011-05-17 (×2): 2 [IU] via SUBCUTANEOUS
  Administered 2011-05-18: 3 [IU] via SUBCUTANEOUS
  Administered 2011-05-18: 2 [IU] via SUBCUTANEOUS
  Administered 2011-05-18: 3 [IU] via SUBCUTANEOUS
  Administered 2011-05-19 (×3): 2 [IU] via SUBCUTANEOUS
  Administered 2011-05-20: 1 [IU] via SUBCUTANEOUS
  Administered 2011-05-20: 3 [IU] via SUBCUTANEOUS
  Administered 2011-05-20 – 2011-05-21 (×2): 2 [IU] via SUBCUTANEOUS
  Administered 2011-05-21: 3 [IU] via SUBCUTANEOUS
  Filled 2011-05-15: qty 3

## 2011-05-15 MED ORDER — BIOTENE DRY MOUTH MT LIQD
15.0000 mL | Freq: Two times a day (BID) | OROMUCOSAL | Status: DC
  Administered 2011-05-15 – 2011-05-21 (×8): 15 mL via OROMUCOSAL

## 2011-05-15 MED ORDER — PANTOPRAZOLE SODIUM 40 MG IV SOLR
40.0000 mg | Freq: Two times a day (BID) | INTRAVENOUS | Status: DC
  Administered 2011-05-15 (×2): 40 mg via INTRAVENOUS
  Filled 2011-05-15 (×5): qty 40

## 2011-05-15 MED ORDER — ONDANSETRON HCL 4 MG PO TABS: 4.0000 mg | ORAL_TABLET | Freq: Four times a day (QID) | ORAL | Status: DC | PRN

## 2011-05-15 MED ORDER — MEMANTINE HCL 10 MG PO TABS
10.0000 mg | ORAL_TABLET | Freq: Two times a day (BID) | ORAL | Status: DC
  Administered 2011-05-15 – 2011-05-21 (×12): 10 mg via ORAL
  Filled 2011-05-15 (×14): qty 1

## 2011-05-15 MED ORDER — ACETAMINOPHEN 325 MG PO TABS
650.0000 mg | ORAL_TABLET | Freq: Four times a day (QID) | ORAL | Status: DC | PRN
  Administered 2011-05-18 – 2011-05-21 (×4): 650 mg via ORAL
  Filled 2011-05-15 (×4): qty 2

## 2011-05-15 MED ORDER — NITROGLYCERIN 0.4 MG SL SUBL: 0.4000 mg | SUBLINGUAL_TABLET | SUBLINGUAL | Status: DC | PRN

## 2011-05-15 NOTE — Progress Notes (Signed)
IV access obtained blood admin. Initiated at 0500. 300 cc wasted from previous blood transfusion due to loss of access

## 2011-05-15 NOTE — Progress Notes (Signed)
Blood Admin initiated at 0200 however IV access was lost.

## 2011-05-15 NOTE — Progress Notes (Signed)
After 1st unit PRBCs temp was 99.8. MD notified. No response, waiting 1 hour and rechecked temp before administering 2nd unit PRBCs. MD notified of temp when rounding on pt.

## 2011-05-15 NOTE — Progress Notes (Signed)
Subjective: No complaints of any more chest pain. She thinks she has had another black BM this afternoon. No abdominal pain. She states that she had an EGD about 2 yrs ago and was told she had ulcers.   Objective: Blood pressure 143/39, pulse 64, temperature 100.2 F (37.9 C), temperature source Oral, resp. rate 18, height 5\' 6"  (1.676 m), weight 75 kg (165 lb 5.5 oz), SpO2 100.00%. Weight change:   Intake/Output Summary (Last 24 hours) at 05/15/11 1602 Last data filed at 05/15/11 1440  Gross per 24 hour  Intake    385 ml  Output   1225 ml  Net   -840 ml    Physical Exam: General appearance: alert and cooperative Throat: lips, mucosa, and tongue normal; teeth and gums normal Lungs: clear to auscultation bilaterally Heart: regular rate and rhythm, S1, S2 normal, no murmur, click, rub or gallop Abdomen: soft, non-tender; bowel sounds normal; no masses,  no organomegaly Extremities: extremities normal, atraumatic, no cyanosis or edema  Lab Results:  [facility] 05/15/11 0430 05/14/11 2117  NA 143 141  K 4.4 4.0  CL 111 108  CO2 21 22  GLUCOSE 176* 191*  BUN 53* 52*  CREATININE 1.40* 1.51*  CALCIUM 9.0 9.2  MG -- --  PHOS -- --    Basename 05/15/11 0430 05/14/11 2117  AST 14 13  ALT 13 13  ALKPHOS 46 47  BILITOT 0.5 0.5  PROT 5.8* 5.9*  ALBUMIN 3.0* 3.0*   No results found for this basename: LIPASE:2,AMYLASE:2 in the last 72 hours  Basename 05/15/11 1435 05/14/11 2117  WBC 10.8* 10.0  NEUTROABS -- 6.4  HGB 9.2* 7.0*  HCT 26.7* 20.9*  MCV 87.0 87.8  PLT 121* 135*   No results found for this basename: CKTOTAL:3,CKMB:3,CKMBINDEX:3,TROPONINI:3 in the last 72 hours No components found with this basename: POCBNP:3 No results found for this basename: DDIMER:2 in the last 72 hours No results found for this basename: HGBA1C:2 in the last 72 hours No results found for this basename: CHOL:2,HDL:2,LDLCALC:2,TRIG:2,CHOLHDL:2,LDLDIRECT:2 in the last 72 hours No results found  for this basename: TSH,T4TOTAL,FREET3,T3FREE,THYROIDAB in the last 72 hours No results found for this basename: VITAMINB12:2,FOLATE:2,FERRITIN:2,TIBC:2,IRON:2,RETICCTPCT:2 in the last 72 hours  Micro Results: Recent Results (from the past 240 hour(s))  MRSA PCR SCREENING     Status: Normal   Collection Time   05/15/11  1:34 AM      Component Value Range Status Comment   MRSA by PCR NEGATIVE  NEGATIVE  Final     Studies/Results: Dg Chest 2 View  05/14/2011  *RADIOLOGY REPORT*  Clinical Data: Chest pain  CHEST - 2 VIEW  Comparison: None.  Findings: Cardiomegaly.  Previous CABG.  No infiltrates or failure. No effusion or pneumothorax.  Degenerative change thoracic spine.  IMPRESSION: Cardiomegaly with CABG, no active infiltrates.  Little change from priors.  Original Report Authenticated By: Staci Righter, M.D.   Ct Head Wo Contrast  05/14/2011  *RADIOLOGY REPORT*  Clinical Data: Unsteady gait with chest pain.  CT HEAD WITHOUT CONTRAST  Technique:  Contiguous axial images were obtained from the base of the skull through the vertex without contrast.  Comparison: MRI brain 01/08/2011.  Findings: Mild atrophy with chronic microvascular ischemic change. No acute stroke, acute hemorrhage, mass lesion, hydrocephalus, or extra-axial fluid.  Chronic infarctions of the left basal ganglia and right occipital lobe.  Vascular calcification.  Intact calvarium.  Clear sinuses and mastoids.  IMPRESSION: Atrophy and small vessel disease.  No acute intracranial findings.  Original Report Authenticated By: Staci Righter, M.D.    Medications: Scheduled Meds:   . antiseptic oral rinse  15 mL Mouth Rinse q12n4p  . chlorhexidine  15 mL Mouth Rinse BID  . insulin aspart  0-5 Units Subcutaneous QHS  . insulin aspart  0-9 Units Subcutaneous TID WC  . memantine  10 mg Oral BID  . metoprolol tartrate  12.5 mg Oral BID  . pantoprazole (PROTONIX) IV  40 mg Intravenous Q12H   Continuous Infusions:   . sodium  chloride     PRN Meds:.acetaminophen, morphine, nitroGLYCERIN, ondansetron (ZOFRAN) IV, ondansetron  Assessment/Plan: Principal Problem:  *GIB (gastrointestinal bleeding) Still had a black BM today. No further chest pain. She has received 2 units of blood and will be set up for an EGD by GI. She has a h/o Duodenal ulcer in the past and has been on a PPI daily.   Active Problems:  Anemia related to acute blood loss S/p 2 units PRBC, cont to follow CBC Q 12 while bleeding  ARF Cont to follow. Holding lasix. Baseline Cr is 1.2-1.3.    HYPERTENSION Resumed Lopressor at a low dose. Will cont to follow.    CHEST PAIN- h/o CAD and CABG Resolved with blood transfusion.    Diabetes mellitus without mention of complication Holding Lantus. On sliding scale now.    PVD Holding ASA and Plavix   LOS: 1 day   [facility] [phone] 05/15/2011, 4:02 PM

## 2011-05-15 NOTE — Consult Note (Signed)
[facility] Consult: 12:34 PM 05/15/2011   Referring Provider: Dr Jonelle Sidle  Primary Care Physician:  Jeb Levering, Philbert Riser, MD Primary Gastroenterologist:  Dr. Carlean Purl   Reason for Consultation:  Epigastric pain, anemia, heme positive.   HPI: Destiny [name] is a 78 y.o. female.  Admitted early this morning 4 complaint of chest pain. She has a history of gastritis and duodenal ulcer in 2009. She had GI bleed associated with this. At that time she was using Plavix, aspirin and NSAIDs.  Her home meds list includes protonix once daily, Plavix, baby aspirin as well as iron and oral vitamin B12.  Episode of SSCP Wednesday evening. It resolved after taking 2 nitroglycerin sublingual. She had a good day Thursday. A recurrent episode of sepsis sternal chest pain on Thursday evening that did not resolve with sublingual nitroglycerin. She describes some sweating but not really shortness of breath. She also describes some abdominal griping, mild nausea but no emesis. This was occurring along with dark loose and sometimes tarry stools which occurred Wednesday Thursday and today. She is heme positive on fecal occult blood test. Hemoglobin is 7 with a normal MCV. In October of 2012 she had a hemoglobin of 11.3, on February 20 hemoglobin was 12.4. So she's had a significant drop in her blood counts. Platelets are a bit low at 135. Both BUN and creatinine are proportionately increased compared with levels one week ago. She takes four 81 mg aspirin daily. She takes Protonix daily. She denies use of Aleve and other nonsteroidal anti-inflammatory medications. Her weight has increased about 5 pounds over last several weeks. She also says that her abdominal girth has increased and she has had to get rid of some of her close because they're no longer fit around her waist. So far her labs show normal LFTs, renal insufficiency, sugars in the high 100s  Head CT performed shows small  vessel disease and atrophy. Chest x-ray is showing nothing in the way of acute changes just prior CABG and cardiomegaly.   Past Medical History  Diagnosis Date  . Undiagnosed cardiac murmurs   . PVD (peripheral vascular disease)   . CAD (coronary artery disease)   . Hypertension   . Hypernatremia   . Hyperlipidemia   . Duodenal ulcer     acute hemorrhage  . Hyperkalemia   . Diarrhea   . Achlorhydria   . Iron deficiency anemia secondary to blood loss (chronic)   . Weakness   . Callus   . Amaurosis fugax   . Carotid bruit   . Diabetes mellitus type II   . Dementia   . Diabetic retinopathy     Past Surgical History  Procedure Date  . Coronary artery bypass graft 1999  . Abdominal hysterectomy   . Upper gastrointestinal endoscopy 02/22/2008    w/biopsy, duedenal ulcer, antrum erosions    Prior to Admission medications   Medication Sig Start Date End Date Taking? Authorizing Provider  amLODipine (NORVASC) 10 MG tablet Take 10 mg by mouth daily.   Yes Historical Provider, MD  aspirin 81 MG chewable tablet Chew 162 mg by mouth 2 (two) times daily.   Yes Historical Provider, MD  Cholecalciferol (VITAMIN D) 1000 UNITS capsule Take 1,000 Units by mouth daily.    Yes Historical Provider, MD  clopidogrel (PLAVIX) 75 MG tablet Take 75 mg by mouth daily.   Yes Historical Provider, MD  colesevelam (WELCHOL) 625 MG tablet Take 625-1,875 mg by mouth daily.  07/08/10  Yes Herbie Baltimore  Shawna Orleans, DO  Cyanocobalamin (VITAMIN B-12) 2500 MCG SUBL Place 2,500 mcg under the tongue daily.    Yes Historical Provider, MD  Ferrous Sulfate (IRON) 325 (65 FE) MG TABS Take 325 mg by mouth 2 (two) times daily.    Yes Historical Provider, MD  furosemide (LASIX) 20 MG tablet Take 20 mg by mouth daily. 03/24/11  Yes Philbert Riser Jeb Levering., MD  Glucosamine-Chondroit-Vit C-Mn (GLUCOSAMINE CHONDR 500 COMPLEX) CAPS Take 1 capsule by mouth daily.    Yes Historical Provider, MD  glucose blood (ONE TOUCH TEST STRIPS) test  strip Use as instructed to check blood sugar three times a day Dx. 250.00 05/14/11  Yes Philbert Riser Jeb Levering., MD  insulin glargine (LANTUS) 100 UNIT/ML injection Inject 50 Units into the skin at bedtime.  07/08/10 07/08/11 Yes Drema Pry, DO  insulin lispro (HUMALOG) 100 UNIT/ML injection Inject 20 Units into the skin 3 (three) times daily before meals.  04/23/11  Yes Philbert Riser Jeb Levering., MD  isosorbide mononitrate (IMDUR) 30 MG 24 hr tablet Take 30 mg by mouth daily. 03/24/11  Yes Philbert Riser Jeb Levering., MD  memantine (NAMENDA) 10 MG tablet Take 10 mg by mouth 2 (two) times daily.   Yes Historical Provider, MD  metoprolol succinate (TOPROL-XL) 50 MG 24 hr tablet Take 25 mg by mouth daily. 07/08/10  Yes Drema Pry, DO  Multiple Vitamins-Minerals (CENTRUM SILVER PO) Take 1 tablet by mouth daily.    Yes Historical Provider, MD  nitroGLYCERIN (NITROSTAT) 0.4 MG SL tablet Place 0.4 mg under the tongue every 5 (five) minutes as needed. For chest pain 12/22/10  Yes Philbert Riser Jeb Levering., MD  Omega-3 Fatty Acids (FISH OIL) 1200 MG CAPS Take 2,400 mg by mouth 2 (two) times daily.    Yes Historical Provider, MD  pantoprazole (PROTONIX) 40 MG tablet Take 40 mg by mouth daily.   Yes Historical Provider, MD  quinapril (ACCUPRIL) 40 MG tablet Take 40 mg by mouth at bedtime.   Yes Historical Provider, MD  rosuvastatin (CRESTOR) 20 MG tablet Take 20 mg by mouth daily.   Yes Historical Provider, MD  Insulin Pen Needle (B-D ULTRAFINE III SHORT PEN) 31G X 8 MM MISC by Does not apply route. Use 4 times daily for insulin injection     Historical Provider, MD    Scheduled Meds:    . antiseptic oral rinse  15 mL Mouth Rinse q12n4p  . chlorhexidine  15 mL Mouth Rinse BID  . pantoprazole (PROTONIX) IV  40 mg Intravenous Q12H   Infusions:    . sodium chloride     PRN Meds: acetaminophen, morphine, nitroGLYCERIN, ondansetron (ZOFRAN) IV, ondansetron   Allergies as of 05/14/2011  . (No Known Allergies)     Family History  Problem Relation Age of Onset  . Coronary artery disease    . Hypertension    . Stroke      History   Social History  . Marital Status: Widowed    Spouse Name: N/A    Number of Children: N/A  . Years of Education: N/A   Occupational History  . Not on file.   Social History Main Topics  . Smoking status: Former Smoker    Types: Cigarettes    Quit date: 12/31/2000  . Smokeless tobacco: Never Used  . Alcohol Use: No  . Drug Use: No  . Sexually Active: Not on file   Other Topics Concern  . Not on file   Social History Narrative  Widowed - husband passed away in Feb 2011lives with daughter with epilepsy - mental retardationRetired  Former Smoker   Alcohol use-no       Occupation: Retired    son - [place]: Constitutional:  weakness ENT:  No nosebleeds Pulm:  Chronic dyspnea and orthopnea. No cough CV:  No palpitations. Chest pain is now resolved. GU:  No blood in the urine no dysuria. GI:  As above. No dysphagia. Heme:  Patient does not recall a history of anemia but she was anemic back in 2009 at the time of the GI bleed.    Transfusions:  Family think she was transfused in 2009 when she had GI bleed Neuro:  Never gets headaches. No paresthesias. Derm:  Has a sore on her foot on the left. Endocrine:  No excessive thirst, no excessive urination Immunization:  Flu shot is up to date. Travel:  none   PHYSICAL EXAM: Vital signs in last 24 hours: Temp:  [98 F (36.7 C)-100.2 F (37.9 C)] 100.2 F (37.9 C) (03/01 1210) Pulse Rate:  [56-73] 64  (03/01 1210) Resp:  [13-25] 18  (03/01 1210) BP: (101-147)/(31-45) 143/39 mmHg (03/01 1210) SpO2:  [98 %-100 %] 100 % (03/01 1210) Weight:  [165 lb 5.5 oz (75 kg)] 165 lb 5.5 oz (75 kg) (03/01 0342)  General: pale, elderly somewhat chronically ill-appearing white female Head:  Normocephalic, atraumatic  Eyes:  Left lid droop. Conjunctiva slightly pale. Extraocular movements  intact Ears:  No hearing aids in place.  Nose:   Hearing not significantly compromised. Mouth:  Upper dentures in place .  she has no teeth. The posterior pink and clear. Neck:  No JVD, masses, no thyromegaly Lungs:  Clear to auscultation and percussion bilaterally. No dyspnea at rest Heart: regular rate and rhythm with a soft, 1/6 systolic murmur. Abdomen:  Soft, no masses, no bruits. Aortic pulse is prominent but not widened. No hepatosplenomegaly. No hernias. Nontender.   Rectal: black, heme positive pasty textured stool.  Musc/Skeltl: no obvious joint swelling. Extremities:  No pedal edema appear  Neurologic:  Patient is alert and oriented x3. However she has poor recall of recent and past events. Throughout the interview she looked for Richard daughter-in-law in order to have her answer the questions. She moves all 4s.  No tremor. Skin:  No telangiectasia, no palmar erythema Tattoos:  none Nodes:  None and neck or groin   Psych:  Pleasant, relaxed, not depressed.  She is alert and oriented x 4  Intake/Output from previous day: 02/28 0701 - 03/01 0700 In: 375 [Blood:375] Out: -  Intake/Output this shift: Total I/O In: 10 [IV Piggyback:10] Out: 500 [Urine:500]  LAB RESULTS:  Basename 05/14/11 2117  WBC 10.0  HGB 7.0*  HCT 20.9*  PLT 135*   BMET Lab Results  Component Value Date   NA 143 05/15/2011   NA 141 05/14/2011   NA 143 05/06/2011   K 4.4 05/15/2011   K 4.0 05/14/2011   K 4.6 05/06/2011   CL 111 05/15/2011   CL 108 05/14/2011   CL 104 05/06/2011   CO2 21 05/15/2011   CO2 22 05/14/2011   CO2 25 05/06/2011   GLUCOSE 176* 05/15/2011   GLUCOSE 191* 05/14/2011   GLUCOSE 133* 05/06/2011   BUN 53* 05/15/2011   BUN 52* 05/14/2011   BUN 29* 05/06/2011   CREATININE 1.40* 05/15/2011   CREATININE 1.51* 05/14/2011   CREATININE 1.36* 05/06/2011   CALCIUM 9.0  05/15/2011   CALCIUM 9.2 05/14/2011   CALCIUM 9.6 05/06/2011   LFT  Basename 05/15/11 0430 05/14/11 2117  PROT 5.8* 5.9*  ALBUMIN  3.0* 3.0*  AST 14 13  ALT 13 13  ALKPHOS 46 47  BILITOT 0.5 0.5  BILIDIR -- --  IBILI -- --   PT/INR Lab Results  Component Value Date   INR 1.11 05/15/2011   INR 1.1 ratio* 12/31/2008   INR 1.1 08/12/2007   RADIOLOGY STUDIES: Dg Chest 2 View  05/14/2011  *RADIOLOGY REPORT*  Clinical Data: Chest pain  CHEST - 2 VIEW  Comparison: None.  Findings: Cardiomegaly.  Previous CABG.  No infiltrates or failure. No effusion or pneumothorax.  Degenerative change thoracic spine.  IMPRESSION: Cardiomegaly with CABG, no active infiltrates.  Little change from priors.  Original Report Authenticated By: Staci Righter, M.D.   Ct Head Wo Contrast  05/14/2011  *RADIOLOGY REPORT*  Clinical Data: Unsteady gait with chest pain.  CT HEAD WITHOUT CONTRAST  Technique:  Contiguous axial images were obtained from the base of the skull through the vertex without contrast.  Comparison: MRI brain 01/08/2011.  Findings: Mild atrophy with chronic microvascular ischemic change. No acute stroke, acute hemorrhage, mass lesion, hydrocephalus, or extra-axial fluid.  Chronic infarctions of the left basal ganglia and right occipital lobe.  Vascular calcification.  Intact calvarium.  Clear sinuses and mastoids.  IMPRESSION: Atrophy and small vessel disease.  No acute intracranial findings.  Original Report Authenticated By: Staci Righter, M.D.    ENDOSCOPIC STUDIES: 02/2008    EGD   Silvano Rusk   To evaluate melena in setting PLavix, ASA, NSAIDs 1) Ulcer in the bulb/descending duodenum  2) Erosions, multiple in the antrum  3) Otherwise normal examination  Suspect ulcer and gastritis due to NSAIDs but could also be related to H. pylori so biopsies taken   (unable to locate.......Marland KitchenCLO test was negative for H pylori  Gatha Mayer, MD, [facility]  RECOMMENDATIONS:                                                                                                                          Path report) PPI bid for now  Treat for H.  pylori if +  No NSAIDS, but 81 mg ASA ok if needed, would hold Plavix x 1-2 weeks if we can  REPEAT EXAM: She will likely need a colonoscopy after discharge and can be arranged at follow-up with me.    Colonoscopy:    2002?   Unable to pull up report. Do not see colonoscopy corresponding with  Above 2009 plans.   IMPRESSION: 1. Melena and precipitous decline of hemoglobin/hematocrit within the last week. All of this consistent with upper GI bleed in a patient taking 4 baby aspirin and Plavix. Though she does take daily PPI, suspect this is a recurrence of peptic ulcer disease. She had duodenal ulcer and gastritis in 2009 causing GI bleed and anemia. 2.  IDDM 3.  Coronary artery disease.  Substernal chest pain resolved.  I don't see any cardiac enzymes corresponding with this admission.  I do not see a cardiology consult for this admission. 4.  Dementia, probably vascular in origin given head CT findings.   PLAN: Will need upper endoscopy. Timing of this to be determined. For now will continue treatment with twice daily IV Protonix. She can have clear liquids.   LOS: 1 day   Azucena Freed  05/15/2011, 12:34 PM Pager: [phone]   [facility] Attending:  I have seen and evaluated the patient - my hx/physical findings same as above. I did not repeat rectal exam. Clinical scenario compatible with upper GI bleed. She is on Plavix (was) and still in system. Risk of EGD greater than normal on that but think EGD needed to evaluate for cause and possibly treat.   Plan for EGD tomorrow. The risks and benefits as well as alternatives of endoscopic procedure(s) have been discussed and reviewed. All questions answered. The patient agrees to proceed.

## 2011-05-15 NOTE — H&P (Signed)
Destiny [name] is an 78 y.o. female.   Chief Complaint: Chest pain HPI:  78 Yo with multiple medical problems including history of Duodenal Ulcer with prior GI bleed here with generalized chest pain, no SOB. Has weakness and dizziness. Noted increased darkening of her stool in the last 2 weeks. She had a similar episode of Chest pain 2 weeks ago that got better but returned now. She has history of CAD but this does not feel like her usual chest pain. Had some nausea yesterday but that has gotten better.   Past Medical History  Diagnosis Date  . Undiagnosed cardiac murmurs   . PVD (peripheral vascular disease)   . CAD (coronary artery disease)   . Hypertension   . Hypernatremia   . Hyperlipidemia   . Duodenal ulcer     acute hemorrhage  . Hyperkalemia   . Diarrhea   . Achlorhydria   . Iron deficiency anemia secondary to blood loss (chronic)   . Weakness   . Callus   . Amaurosis fugax   . Carotid bruit   . Diabetes mellitus type II   . Dementia   . Diabetic retinopathy     Past Surgical History  Procedure Date  . Coronary artery bypass graft 1999  . Abdominal hysterectomy   . Upper gastrointestinal endoscopy 02/22/2008    w/biopsy, duedenal ulcer, antrum erosions    Family History  Problem Relation Age of Onset  . Coronary artery disease    . Hypertension    . Stroke     Social History:  reports that she quit smoking about 10 years ago. She has never used smokeless tobacco. She reports that she does not drink alcohol. Her drug history not on file.  Allergies: No Known Allergies  No current facility-administered medications on file as of 05/14/2011.   Medications Prior to Admission  Medication Sig Dispense Refill  . Cholecalciferol (VITAMIN D) 1000 UNITS capsule Take 1,000 Units by mouth daily.       . colesevelam (WELCHOL) 625 MG tablet Take 625-1,875 mg by mouth daily.       . Cyanocobalamin (VITAMIN B-12) 2500 MCG SUBL Place 2,500 mcg under the tongue daily.         . Ferrous Sulfate (IRON) 325 (65 FE) MG TABS Take 325 mg by mouth 2 (two) times daily.       . furosemide (LASIX) 20 MG tablet Take 20 mg by mouth daily.      . Glucosamine-Chondroit-Vit C-Mn (GLUCOSAMINE CHONDR 500 COMPLEX) CAPS Take 1 capsule by mouth daily.       . insulin glargine (LANTUS) 100 UNIT/ML injection Inject 50 Units into the skin at bedtime.       . insulin lispro (HUMALOG) 100 UNIT/ML injection Inject 20 Units into the skin 3 (three) times daily before meals.       . isosorbide mononitrate (IMDUR) 30 MG 24 hr tablet Take 30 mg by mouth daily.      . metoprolol succinate (TOPROL-XL) 50 MG 24 hr tablet Take 25 mg by mouth daily.      . Multiple Vitamins-Minerals (CENTRUM SILVER PO) Take 1 tablet by mouth daily.       . nitroGLYCERIN (NITROSTAT) 0.4 MG SL tablet Place 0.4 mg under the tongue every 5 (five) minutes as needed. For chest pain      . Omega-3 Fatty Acids (FISH OIL) 1200 MG CAPS Take 2,400 mg by mouth 2 (two) times daily.       Marland Kitchen  Insulin Pen Needle (B-D ULTRAFINE III SHORT PEN) 31G X 8 MM MISC by Does not apply route. Use 4 times daily for insulin injection         Results for orders placed during the hospital encounter of 05/14/11 (from the past 48 hour(s))  CBC     Status: Abnormal   Collection Time   05/14/11  9:17 PM      Component Value Range Comment   WBC 10.0  4.0 - 10.5 (K/uL)    RBC 2.38 (*) 3.87 - 5.11 (MIL/uL)    Hemoglobin 7.0 (*) 12.0 - 15.0 (g/dL)    HCT 20.9 (*) 36.0 - 46.0 (%)    MCV 87.8  78.0 - 100.0 (fL)    MCH 29.4  26.0 - 34.0 (pg)    MCHC 33.5  30.0 - 36.0 (g/dL)    RDW 15.0  11.5 - 15.5 (%)    Platelets 135 (*) 150 - 400 (K/uL)   DIFFERENTIAL     Status: Normal   Collection Time   05/14/11  9:17 PM      Component Value Range Comment   Neutrophils Relative 64  43 - 77 (%)    Neutro Abs 6.4  1.7 - 7.7 (K/uL)    Lymphocytes Relative 24  12 - 46 (%)    Lymphs Abs 2.4  0.7 - 4.0 (K/uL)    Monocytes Relative 10  3 - 12 (%)    Monocytes  Absolute 1.0  0.1 - 1.0 (K/uL)    Eosinophils Relative 2  0 - 5 (%)    Eosinophils Absolute 0.2  0.0 - 0.7 (K/uL)    Basophils Relative 0  0 - 1 (%)    Basophils Absolute 0.0  0.0 - 0.1 (K/uL)   COMPREHENSIVE METABOLIC PANEL     Status: Abnormal   Collection Time   05/14/11  9:17 PM      Component Value Range Comment   Sodium 141  135 - 145 (mEq/L)    Potassium 4.0  3.5 - 5.1 (mEq/L)    Chloride 108  96 - 112 (mEq/L)    CO2 22  19 - 32 (mEq/L)    Glucose, Bld 191 (*) 70 - 99 (mg/dL)    BUN 52 (*) 6 - 23 (mg/dL)    Creatinine, Ser 1.51 (*) 0.50 - 1.10 (mg/dL)    Calcium 9.2  8.4 - 10.5 (mg/dL)    Total Protein 5.9 (*) 6.0 - 8.3 (g/dL)    Albumin 3.0 (*) 3.5 - 5.2 (g/dL)    AST 13  0 - 37 (U/L)    ALT 13  0 - 35 (U/L)    Alkaline Phosphatase 47  39 - 117 (U/L)    Total Bilirubin 0.5  0.3 - 1.2 (mg/dL)    GFR calc non Af Amer 32 (*) >90 (mL/min)    GFR calc Af Amer 37 (*) >90 (mL/min)   POCT I-STAT TROPONIN I     Status: Normal   Collection Time   05/14/11  9:34 PM      Component Value Range Comment   Troponin i, poc 0.00  0.00 - 0.08 (ng/mL)    Comment 3            SAMPLE TO BLOOD BANK     Status: Normal   Collection Time   05/14/11 10:08 PM      Component Value Range Comment   Blood Bank Specimen SAMPLE AVAILABLE FOR TESTING      Sample  Expiration 05/15/2011     TYPE AND SCREEN     Status: Normal (Preliminary result)   Collection Time   05/14/11 10:08 PM      Component Value Range Comment   ABO/RH(D) A POS      Antibody Screen NEG      Sample Expiration 05/17/2011      Unit Number [id number]      Blood Component Type RED CELLS,LR      Unit division 00      Status of Unit ALLOCATED      Transfusion Status OK TO TRANSFUSE      Crossmatch Result Compatible      Unit Number [id number]      Blood Component Type RED CELLS,LR      Unit division 00      Status of Unit ALLOCATED      Transfusion Status OK TO TRANSFUSE      Crossmatch Result Compatible     OCCULT BLOOD, POC  DEVICE     Status: Normal   Collection Time   05/14/11 10:43 PM      Component Value Range Comment   Fecal Occult Bld POSITIVE     PREPARE RBC (CROSSMATCH)     Status: Normal   Collection Time   05/14/11 11:21 PM      Component Value Range Comment   Order Confirmation ORDER PROCESSED BY BLOOD BANK      Dg Chest 2 View  05/14/2011  *RADIOLOGY REPORT*  Clinical Data: Chest pain  CHEST - 2 VIEW  Comparison: None.  Findings: Cardiomegaly.  Previous CABG.  No infiltrates or failure. No effusion or pneumothorax.  Degenerative change thoracic spine.  IMPRESSION: Cardiomegaly with CABG, no active infiltrates.  Little change from priors.  Original Report Authenticated By: Staci Righter, M.D.   Ct Head Wo Contrast  05/14/2011  *RADIOLOGY REPORT*  Clinical Data: Unsteady gait with chest pain.  CT HEAD WITHOUT CONTRAST  Technique:  Contiguous axial images were obtained from the base of the skull through the vertex without contrast.  Comparison: MRI brain 01/08/2011.  Findings: Mild atrophy with chronic microvascular ischemic change. No acute stroke, acute hemorrhage, mass lesion, hydrocephalus, or extra-axial fluid.  Chronic infarctions of the left basal ganglia and right occipital lobe.  Vascular calcification.  Intact calvarium.  Clear sinuses and mastoids.  IMPRESSION: Atrophy and small vessel disease.  No acute intracranial findings.  Original Report Authenticated By: Staci Righter, M.D.    Review of Systems  Constitutional: Positive for malaise/fatigue.  HENT: Negative.   Eyes: Negative.   Respiratory: Negative.   Cardiovascular: Positive for chest pain.  Gastrointestinal: Positive for heartburn, nausea and blood in stool.  Genitourinary: Negative.   Musculoskeletal: Negative.   Skin: Negative.   Neurological: Positive for dizziness.  Endo/Heme/Allergies: Negative.   Psychiatric/Behavioral: Negative.     Blood pressure 133/41, pulse 62, temperature 98.5 F (36.9 C), temperature source  Oral, resp. rate 19, SpO2 100.00%. Physical Exam  Constitutional: She is oriented to person, place, and time. She appears well-developed and well-nourished.  HENT:  Head: Normocephalic and atraumatic.  Right Ear: External ear normal.  Left Ear: External ear normal.  Nose: Nose normal.  Mouth/Throat: Oropharynx is clear and moist.  Eyes: Conjunctivae and EOM are normal. Pupils are equal, round, and reactive to light.  Neck: Normal range of motion. Neck supple.  Cardiovascular: Normal rate, regular rhythm, normal heart sounds and intact distal pulses.   Respiratory: Effort normal and breath sounds normal.  GI: Soft. Bowel sounds are normal.  Musculoskeletal: Normal range of motion.  Neurological: She is alert and oriented to person, place, and time. She has normal reflexes.  Skin: Skin is warm and dry.  Psychiatric: She has a normal mood and affect. Her behavior is normal. Judgment and thought content normal.     Assessment/Plan 1. GIB: patient had guiac positive stool in the ER. Her hemoglobin has dropped to 7 from 12.4 10 days ago. Will admit to stepdown, IV protonix, IVF, transfuse 2 units of PRBC, GI Consulted and will check serial CBCs, NPo and more than likely EGD in AM 2.Anemia of Blood Loss: As in 1 above 3. Chest pain: probably duodenal ulcer pain leading to her GIB. With hx of CAD, need to rule out ACS. Could be angina from her anemia also. Check cardiac enzymes and tele monitoring. Transfuse PRBc to get hemoglobin close to 10.0 4.DM2: SSI. Will be NPO until GI evaluation is completed.  Asuna Peth,LAWAL 05/15/2011, 1:08 AM

## 2011-05-16 ENCOUNTER — Encounter (HOSPITAL_COMMUNITY): Payer: Self-pay

## 2011-05-16 ENCOUNTER — Encounter (HOSPITAL_COMMUNITY)
Admission: EM | Disposition: A | Discharge status: Home / Self Care | Payer: Self-pay | Source: Home / Self Care | Attending: Internal Medicine

## 2011-05-16 HISTORY — PX: ESOPHAGOGASTRODUODENOSCOPY: SHX5428

## 2011-05-16 LAB — CBC
Hemoglobin: 7.9 g/dL — ABNORMAL LOW (ref 12.0–15.0)
MCH: 29.7 pg (ref 26.0–34.0)
MCHC: 33.8 g/dL (ref 30.0–36.0)
MCV: 88.2 fL (ref 78.0–100.0)
Platelets: 126 10*3/uL — ABNORMAL LOW (ref 150–400)
Platelets: 110 10*3/uL — ABNORMAL LOW (ref 150–400)
RBC: 2.67 MIL/uL — ABNORMAL LOW (ref 3.87–5.11)
RDW: 15.3 % (ref 11.5–15.5)
RDW: 15.1 % (ref 11.5–15.5)
WBC: 10 10*3/uL (ref 4.0–10.5)
WBC: 10.3 10*3/uL (ref 4.0–10.5)
WBC: 8.6 10*3/uL (ref 4.0–10.5)

## 2011-05-16 LAB — BASIC METABOLIC PANEL
Chloride: 114 mEq/L — ABNORMAL HIGH (ref 96–112)
Creatinine, Ser: 1.09 mg/dL (ref 0.50–1.10)
GFR calc Af Amer: 55 mL/min — ABNORMAL LOW (ref >90)
Potassium: 3.8 mEq/L (ref 3.5–5.1)
Sodium: 144 mEq/L (ref 135–145)

## 2011-05-16 LAB — GLUCOSE, CAPILLARY
Glucose-Capillary: 229 mg/dL — ABNORMAL HIGH (ref 70–99)
Glucose-Capillary: 143 mg/dL — ABNORMAL HIGH (ref 70–99)
Glucose-Capillary: 131 mg/dL — ABNORMAL HIGH (ref 70–99)

## 2011-05-16 SURGERY — EGD (ESOPHAGOGASTRODUODENOSCOPY)
Anesthesia: Moderate Sedation

## 2011-05-16 MED ORDER — MIDAZOLAM HCL 10 MG/2ML IJ SOLN
INTRAMUSCULAR | Status: DC | PRN
  Administered 2011-05-16 (×2): 2 mg via INTRAVENOUS

## 2011-05-16 MED ORDER — FENTANYL NICU IV SYRINGE 50 MCG/ML
INJECTION | INTRAMUSCULAR | Status: DC | PRN
  Administered 2011-05-16 (×2): 25 ug via INTRAVENOUS

## 2011-05-16 MED ORDER — PEG 3350-KCL-NA BICARB-NACL 420 G PO SOLR
2000.0000 mL | Freq: Once | ORAL | Status: AC
  Administered 2011-05-16: 2000 mL via ORAL
  Filled 2011-05-16: qty 4000

## 2011-05-16 MED ORDER — BUTAMBEN-TETRACAINE-BENZOCAINE 2-2-14 % EX AERO
INHALATION_SPRAY | CUTANEOUS | Status: DC | PRN
  Administered 2011-05-16: 2 via TOPICAL

## 2011-05-16 MED ORDER — FENTANYL CITRATE 0.05 MG/ML IJ SOLN
INTRAMUSCULAR | Status: AC
  Filled 2011-05-16: qty 2

## 2011-05-16 MED ORDER — DIPHENHYDRAMINE HCL 50 MG/ML IJ SOLN
INTRAMUSCULAR | Status: AC
  Filled 2011-05-16: qty 1

## 2011-05-16 MED ORDER — MIDAZOLAM HCL 10 MG/2ML IJ SOLN
INTRAMUSCULAR | Status: AC
  Filled 2011-05-16: qty 2

## 2011-05-16 MED ORDER — PANTOPRAZOLE SODIUM 40 MG PO TBEC
40.0000 mg | DELAYED_RELEASE_TABLET | Freq: Every day | ORAL | Status: DC
  Administered 2011-05-16 – 2011-05-21 (×6): 40 mg via ORAL
  Filled 2011-05-16 (×6): qty 1

## 2011-05-16 MED ORDER — METOCLOPRAMIDE HCL 5 MG/ML IJ SOLN
10.0000 mg | Freq: Once | INTRAMUSCULAR | Status: AC
  Administered 2011-05-16: 10 mg via INTRAVENOUS
  Filled 2011-05-16: qty 2

## 2011-05-16 MED ORDER — BISACODYL 5 MG PO TBEC
10.0000 mg | DELAYED_RELEASE_TABLET | Freq: Once | ORAL | Status: AC
  Administered 2011-05-16: 10 mg via ORAL
  Filled 2011-05-16: qty 2

## 2011-05-16 NOTE — Plan of Care (Signed)
Problem: Phase II Progression Outcomes Goal: Tolerating diet Outcome: Not Progressing Pt npo

## 2011-05-16 NOTE — Op Note (Signed)
[facility] [place], [place]  [id number]  ENDOSCOPY PROCEDURE REPORT  PATIENT:  Destiny [name], Destiny [name]  MR#:  [id number] BIRTHDATE:  April 10, 1931, 33 yrs. old  GENDER:  female  ENDOSCOPIST:  Gatha Mayer, MD, [facility]  PROCEDURE DATE:  05/16/2011 PROCEDURE:  EGD, diagnostic [id number] ASA CLASS:  Class III INDICATIONS:  melena  MEDICATIONS:   Fentanyl 50 mcg IV, Versed 4 mg IV TOPICAL ANESTHETIC:  Cetacaine Spray  DESCRIPTION OF PROCEDURE:   After the risks benefits and alternatives of the procedure were thoroughly explained, informed consent was obtained.  The Pentax Gastroscope [id number] endoscope was introduced through the mouth and advanced to the second portion of the duodenum, without limitations.  The instrument was slowly withdrawn as the mucosa was fully examined. <<PROCEDUREIMAGES>>  The upper, middle, and distal third of the esophagus were carefully inspected and no abnormalities were noted. The z-line was well seen at the GEJ. The endoscope was pushed into the fundus which was normal including a retroflexed view. The antrum,gastric body, first and second part of the duodenum were unremarkable. Retroflexed views revealed no abnormalities.    The scope was then withdrawn from the patient and the procedure completed.  COMPLICATIONS:  None  ENDOSCOPIC IMPRESSION: 1) Normal EGD - no cause of bleeding found RECOMMENDATIONS: Needs a colonoscopy but Plavix just discontinued at admission and still active. This limits therapeutic options until cleared at 5-7 days.  will start some colonoscopy prep today to see if melena clears and then determine timing of colonoscopy.  Gatha Mayer, MD, [facility]  n. eSIGNED:   Gatha Mayer at 05/16/2011 11:39 AM  Darene Lamer, [id number]

## 2011-05-16 NOTE — Progress Notes (Signed)
Subjective: No complaints of any more chest pain. No BM today. No nausea or abdominal pain.   Objective: Blood pressure 132/46, pulse 56, temperature 97.4 F (36.3 C), temperature source Oral, resp. rate 22, height 5\' 6"  (1.676 m), weight 74.9 kg (165 lb 2 oz), SpO2 99.00%. Weight change: -0.1 kg (-3.5 oz)  Intake/Output Summary (Last 24 hours) at 05/16/11 1419 Last data filed at 05/16/11 1300  Gross per 24 hour  Intake   1050 ml  Output   2075 ml  Net  -1025 ml    Physical Exam: General appearance: alert and cooperative Throat: lips, mucosa, and tongue normal; teeth and gums normal Lungs: clear to auscultation bilaterally Heart: regular rate and rhythm, S1, S2 normal, no murmur, click, rub or gallop Abdomen: soft, non-tender; bowel sounds normal; no masses,  no organomegaly Extremities: extremities normal, atraumatic, no cyanosis or edema  Lab Results:  Basename 05/16/11 0500 05/15/11 0430  NA 144 143  K 3.8 4.4  CL 114* 111  CO2 23 21  GLUCOSE 119* 176*  BUN 36* 53*  CREATININE 1.09 1.40*  CALCIUM 8.7 9.0  MG -- --  PHOS -- --    Basename 05/15/11 0430 05/14/11 2117  AST 14 13  ALT 13 13  ALKPHOS 46 47  BILITOT 0.5 0.5  PROT 5.8* 5.9*  ALBUMIN 3.0* 3.0*   No results found for this basename: LIPASE:2,AMYLASE:2 in the last 72 hours  Basename 05/16/11 0500 05/16/11 0226 05/14/11 2117  WBC 10.3 10.0 --  NEUTROABS -- -- 6.4  HGB 7.9* 8.1* --  HCT 23.4* 23.8* --  MCV 87.6 87.5 --  PLT 115* 126* --   No results found for this basename: CKTOTAL:3,CKMB:3,CKMBINDEX:3,TROPONINI:3 in the last 72 hours No components found with this basename: POCBNP:3 No results found for this basename: DDIMER:2 in the last 72 hours No results found for this basename: HGBA1C:2 in the last 72 hours No results found for this basename: CHOL:2,HDL:2,LDLCALC:2,TRIG:2,CHOLHDL:2,LDLDIRECT:2 in the last 72 hours No results found for this basename: TSH,T4TOTAL,FREET3,T3FREE,THYROIDAB in the  last 72 hours No results found for this basename: VITAMINB12:2,FOLATE:2,FERRITIN:2,TIBC:2,IRON:2,RETICCTPCT:2 in the last 72 hours  Micro Results: Recent Results (from the past 240 hour(s))  MRSA PCR SCREENING     Status: Normal   Collection Time   05/15/11  1:34 AM      Component Value Range Status Comment   MRSA by PCR NEGATIVE  NEGATIVE  Final     Studies/Results: Dg Chest 2 View  05/14/2011  *RADIOLOGY REPORT*  Clinical Data: Chest pain  CHEST - 2 VIEW  Comparison: None.  Findings: Cardiomegaly.  Previous CABG.  No infiltrates or failure. No effusion or pneumothorax.  Degenerative change thoracic spine.  IMPRESSION: Cardiomegaly with CABG, no active infiltrates.  Little change from priors.  Original Report Authenticated By: Staci Righter, M.D.   Ct Head Wo Contrast  05/14/2011  *RADIOLOGY REPORT*  Clinical Data: Unsteady gait with chest pain.  CT HEAD WITHOUT CONTRAST  Technique:  Contiguous axial images were obtained from the base of the skull through the vertex without contrast.  Comparison: MRI brain 01/08/2011.  Findings: Mild atrophy with chronic microvascular ischemic change. No acute stroke, acute hemorrhage, mass lesion, hydrocephalus, or extra-axial fluid.  Chronic infarctions of the left basal ganglia and right occipital lobe.  Vascular calcification.  Intact calvarium.  Clear sinuses and mastoids.  IMPRESSION: Atrophy and small vessel disease.  No acute intracranial findings.  Original Report Authenticated By: Staci Righter, M.D.    Medications:  Scheduled Meds:    . antiseptic oral rinse  15 mL Mouth Rinse q12n4p  . bisacodyl  10 mg Oral Once  . chlorhexidine  15 mL Mouth Rinse BID  . insulin aspart  0-5 Units Subcutaneous QHS  . insulin aspart  0-9 Units Subcutaneous TID WC  . memantine  10 mg Oral BID  . metoCLOPramide (REGLAN) injection  10 mg Intravenous Once  . metoprolol tartrate  12.5 mg Oral BID  . pantoprazole  40 mg Oral Q1200  . polyethylene  glycol-electrolytes  2,000 mL Oral Once  . DISCONTD: pantoprazole (PROTONIX) IV  40 mg Intravenous Q12H   Continuous Infusions:    . sodium chloride 100 mL/hr at 05/16/11 0220   PRN Meds:.acetaminophen, morphine, nitroGLYCERIN, ondansetron (ZOFRAN) IV, ondansetron, DISCONTD: butamben-tetracaine-benzocaine, DISCONTD: fentaNYL, DISCONTD: midazolam  Assessment/Plan: Principal Problem:  *GIB (gastrointestinal bleeding) EGD does not reveal any source of bleeding. Per Dr Celesta Aver note they will start a colon prep tomorrow.   Anemia related to acute blood loss S/p 2 units PRBC; hgb down again today therefore transfusing another unit of PRBC today.  cont to follow CBC Q 12 while bleeding  ARF Cont to follow. Improving.  Holding lasix.  Cr improved back to baseline- Baseline Cr is 1.2-1.3.    HYPERTENSION Resumed Lopressor at a low dose. Will cont to follow.    CHEST PAIN- h/o CAD and CABG Resolved with blood transfusion.    Diabetes mellitus without mention of complication Holding Lantus. On sliding scale now.    PVD Holding ASA and Plavix   LOS: 2 days   [facility] [phone] 05/16/2011, 2:19 PM

## 2011-05-17 LAB — CBC
MCH: 29.8 pg (ref 26.0–34.0)
MCHC: 34.4 g/dL (ref 30.0–36.0)
Platelets: 115 10*3/uL — ABNORMAL LOW (ref 150–400)
Platelets: 144 10*3/uL — ABNORMAL LOW (ref 150–400)
RBC: 3.07 MIL/uL — ABNORMAL LOW (ref 3.87–5.11)
RDW: 15 % (ref 11.5–15.5)
RDW: 14.6 % (ref 11.5–15.5)
WBC: 7.6 10*3/uL (ref 4.0–10.5)

## 2011-05-17 LAB — TYPE AND SCREEN: Unit division: 0

## 2011-05-17 LAB — URINALYSIS, ROUTINE W REFLEX MICROSCOPIC
Bilirubin Urine: NEGATIVE
Specific Gravity, Urine: 1.011 (ref 1.005–1.030)
Urobilinogen, UA: 0.2 mg/dL (ref 0.0–1.0)

## 2011-05-17 LAB — GLUCOSE, CAPILLARY

## 2011-05-17 NOTE — Progress Notes (Signed)
[facility] ROUNDING NOTE   Subjective: She took 2 L Nulytely and stools cleared. No c/o now. On clears. I was not called about prep status yesterday   Objective: Vital signs in last 24 hours: Temp:  [97.4 F (36.3 C)-99.9 F (37.7 C)] 99.8 F (37.7 C) (03/03 0826) Pulse Rate:  [52-81] 81  (03/03 0826) Resp:  [14-22] 15  (03/03 0826) BP: (113-177)/(35-87) 177/53 mmHg (03/03 0826) SpO2:  [94 %-100 %] 100 % (03/03 0826) Weight:  [165 lb 5.5 oz (75 kg)] 165 lb 5.5 oz (75 kg) (03/03 0500) Last BM Date: 05/16/11 General: NAD Lungs: clear Heart: RRR s1s2 no murmur  Abdomen: soft and nontender Ext: no edema    Intake/Output from previous day: 03/02 0701 - 03/03 0700 In: 3812 [P.O.:1460; I.V.:2000; Blood:350; IV Piggyback:2] Out: [id number] [Urine:1600; [id number] Intake/Output this shift: Total I/O In: 740 [P.O.:540; I.V.:200] Out: 1050 [Urine:1050]   Lab Results:  Basename 05/17/11 0153 05/16/11 1422 05/16/11 0500  WBC 7.6 8.6 10.3  HGB 9.2* 9.6* 7.9*  PLT 115* 110* 115*  MCV 87.0 88.2 87.6   BMET  Basename 05/16/11 0500 05/15/11 0430 05/14/11 2117  NA 144 143 141  K 3.8 4.4 4.0  CL 114* 111 108  CO2 23 21 22   GLUCOSE 119* 176* 191*  BUN 36* 53* 52*  CREATININE 1.09 1.40* 1.51*  CALCIUM 8.7 9.0 9.2   LFT  Basename 05/15/11 0430 05/14/11 2117  PROT 5.8* 5.9*  ALBUMIN 3.0* 3.0*  AST 14 13  ALT 13 13  ALKPHOS 46 47  BILITOT 0.5 0.5  BILIDIR -- --  IBILI -- --   PT/INR  Basename 05/15/11 0430  INR 1.11        Assessment   1) GI Bleed  With melena and negative EGD 2) Acute blood loss anemia - Hgb stable around 9 now and no bleeding 3) Plavix therapy - last taken 2/28   Plan  1) Have decided best to wait 5 days for Plavix to clear as no ongoing bleeding - colonoscopy not urgent but seems best to get done this admit. Explained to patient and son 2) Full liquids for now 3) We will follow-up in next 1-2 days and arrange colonoscopy  - Dr. Henrene Pastor will be taking over service    Silvano Rusk, MD  05/17/2011, 11:46 AM [facility] Pager [phone]

## 2011-05-17 NOTE — Progress Notes (Signed)
Subjective: Has had numerous BMs through the night due to colon prep. Stool is no longer black. She has no complaints today. She understands that GI would like to hold Plavix for a total of 5 days before doing the colonoscopy.   Objective: Blood pressure 183/60, pulse 73, temperature 100.7 F (38.2 C), temperature source Oral, resp. rate 17, height 5\' 6"  (1.676 m), weight 75 kg (165 lb 5.5 oz), SpO2 100.00%. Weight change: 0.1 kg (3.5 oz)  Intake/Output Summary (Last 24 hours) at 05/17/11 1410 Last data filed at 05/17/11 1030  Gross per 24 hour  Intake   3662 ml  Output   5150 ml  Net  -1488 ml    Physical Exam: General appearance: alert and cooperative Throat: lips, mucosa, and tongue normal; teeth and gums normal Lungs: clear to auscultation bilaterally Heart: regular rate and rhythm, S1, S2 normal, no murmur, click, rub or gallop Abdomen: soft, non-tender; bowel sounds normal; no masses,  no organomegaly Extremities: extremities normal, atraumatic, no cyanosis or edema  Lab Results:  Basename 05/16/11 0500 05/15/11 0430  NA 144 143  K 3.8 4.4  CL 114* 111  CO2 23 21  GLUCOSE 119* 176*  BUN 36* 53*  CREATININE 1.09 1.40*  CALCIUM 8.7 9.0  MG -- --  PHOS -- --    Basename 05/15/11 0430 05/14/11 2117  AST 14 13  ALT 13 13  ALKPHOS 46 47  BILITOT 0.5 0.5  PROT 5.8* 5.9*  ALBUMIN 3.0* 3.0*   No results found for this basename: LIPASE:2,AMYLASE:2 in the last 72 hours  Basename 05/17/11 1334 05/17/11 0153 05/14/11 2117  WBC 10.0 7.6 --  NEUTROABS -- -- 6.4  HGB 9.6* 9.2* --  HCT 27.9* 26.7* --  MCV 86.6 87.0 --  PLT 144* 115* --   No results found for this basename: CKTOTAL:3,CKMB:3,CKMBINDEX:3,TROPONINI:3 in the last 72 hours No components found with this basename: POCBNP:3 No results found for this basename: DDIMER:2 in the last 72 hours No results found for this basename: HGBA1C:2 in the last 72 hours No results found for this basename:  CHOL:2,HDL:2,LDLCALC:2,TRIG:2,CHOLHDL:2,LDLDIRECT:2 in the last 72 hours No results found for this basename: TSH,T4TOTAL,FREET3,T3FREE,THYROIDAB in the last 72 hours No results found for this basename: VITAMINB12:2,FOLATE:2,FERRITIN:2,TIBC:2,IRON:2,RETICCTPCT:2 in the last 72 hours  Micro Results: Recent Results (from the past 240 hour(s))  MRSA PCR SCREENING     Status: Normal   Collection Time   05/15/11  1:34 AM      Component Value Range Status Comment   MRSA by PCR NEGATIVE  NEGATIVE  Final     Studies/Results: Dg Chest 2 View  05/14/2011  *RADIOLOGY REPORT*  Clinical Data: Chest pain  CHEST - 2 VIEW  Comparison: None.  Findings: Cardiomegaly.  Previous CABG.  No infiltrates or failure. No effusion or pneumothorax.  Degenerative change thoracic spine.  IMPRESSION: Cardiomegaly with CABG, no active infiltrates.  Little change from priors.  Original Report Authenticated By: Staci Righter, M.D.   Ct Head Wo Contrast  05/14/2011  *RADIOLOGY REPORT*  Clinical Data: Unsteady gait with chest pain.  CT HEAD WITHOUT CONTRAST  Technique:  Contiguous axial images were obtained from the base of the skull through the vertex without contrast.  Comparison: MRI brain 01/08/2011.  Findings: Mild atrophy with chronic microvascular ischemic change. No acute stroke, acute hemorrhage, mass lesion, hydrocephalus, or extra-axial fluid.  Chronic infarctions of the left basal ganglia and right occipital lobe.  Vascular calcification.  Intact calvarium.  Clear sinuses and mastoids.  IMPRESSION: Atrophy and small vessel disease.  No acute intracranial findings.  Original Report Authenticated By: Staci Righter, M.D.    Medications: Scheduled Meds:    . antiseptic oral rinse  15 mL Mouth Rinse q12n4p  . bisacodyl  10 mg Oral Once  . chlorhexidine  15 mL Mouth Rinse BID  . insulin aspart  0-5 Units Subcutaneous QHS  . insulin aspart  0-9 Units Subcutaneous TID WC  . memantine  10 mg Oral BID  . metoCLOPramide  (REGLAN) injection  10 mg Intravenous Once  . metoprolol tartrate  12.5 mg Oral BID  . pantoprazole  40 mg Oral Q1200  . polyethylene glycol-electrolytes  2,000 mL Oral Once   Continuous Infusions:    . DISCONTD: sodium chloride 1,000 mL (05/16/11 1620)   PRN Meds:.acetaminophen, morphine, nitroGLYCERIN, ondansetron (ZOFRAN) IV, ondansetron  Assessment/Plan: Principal Problem:  *GIB (gastrointestinal bleeding) EGD does not reveal any source of bleeding. Per Dr Celesta Aver note they will cont her on clears for now until the scope is done. Will follow in step down for recurrent bleeding.   Anemia related to acute blood loss S/p 3 units PRBC Hgb now stable. Will stop checking CBC Q12.   ARF Cont to follow. Improving.  Holding lasix.  Cr improved back to baseline- Baseline Cr is 1.2-1.3.   Low grade fever Check UA and follow.    HYPERTENSION Resumed Lopressor at a low dose. Will increase today as BP is high.    CHEST PAIN- h/o CAD and CABG Resolved with blood transfusion.    Diabetes mellitus without mention of complication Holding Lantus as she is only on clears. On sliding scale now.    PVD Holding ASA and Plavix   LOS: 3 days   [facility] [phone] 05/17/2011, 2:10 PM

## 2011-05-18 ENCOUNTER — Encounter (HOSPITAL_COMMUNITY): Payer: Self-pay | Admitting: Internal Medicine

## 2011-05-18 LAB — CBC
HCT: 24 % — ABNORMAL LOW (ref 36.0–46.0)
HCT: 26.5 % — ABNORMAL LOW (ref 36.0–46.0)
HCT: 27.1 % — ABNORMAL LOW (ref 36.0–46.0)
Hemoglobin: 9.1 g/dL — ABNORMAL LOW (ref 12.0–15.0)
Hemoglobin: 9.3 g/dL — ABNORMAL LOW (ref 12.0–15.0)
MCH: 29.5 pg (ref 26.0–34.0)
MCV: 86.6 fL (ref 78.0–100.0)
MCV: 87.2 fL (ref 78.0–100.0)
MCV: 86 fL (ref 78.0–100.0)
RBC: 3.04 MIL/uL — ABNORMAL LOW (ref 3.87–5.11)
RBC: 3.15 MIL/uL — ABNORMAL LOW (ref 3.87–5.11)
RDW: 14.3 % (ref 11.5–15.5)
WBC: 7.2 10*3/uL (ref 4.0–10.5)
WBC: 8.3 10*3/uL (ref 4.0–10.5)

## 2011-05-18 LAB — GLUCOSE, CAPILLARY
Glucose-Capillary: 203 mg/dL — ABNORMAL HIGH (ref 70–99)
Glucose-Capillary: 161 mg/dL — ABNORMAL HIGH (ref 70–99)

## 2011-05-18 LAB — URINE CULTURE
Colony Count: 85000
Culture  Setup Time: 201303032144

## 2011-05-18 MED ORDER — METOPROLOL TARTRATE 25 MG PO TABS
25.0000 mg | ORAL_TABLET | Freq: Two times a day (BID) | ORAL | Status: DC
  Administered 2011-05-18 – 2011-05-20 (×6): 25 mg via ORAL
  Filled 2011-05-18 (×8): qty 1

## 2011-05-18 MED ORDER — OXYCODONE HCL 5 MG PO TABS: 5.0000 mg | ORAL_TABLET | ORAL | Status: DC | PRN

## 2011-05-18 NOTE — Progress Notes (Signed)
Subjective: Has had numerous BMs through the night due to colon prep. Stool is no longer black. She has no complaints today. She understands that GI would like to hold Plavix for a total of 5 days before doing the colonoscopy.   Objective: Blood pressure 161/85, pulse 72, temperature 98.8 F (37.1 C), temperature source Oral, resp. rate 17, height 5\' 6"  (1.676 m), weight 74 kg (163 lb 2.3 oz), SpO2 100.00%. Weight change: -1 kg (-2 lb 3.3 oz)  Intake/Output Summary (Last 24 hours) at 05/18/11 1134 Last data filed at 05/18/11 1119  Gross per 24 hour  Intake    830 ml  Output   2550 ml  Net  -1720 ml   Physical Exam: General appearance: alert and cooperative Lungs: clear to auscultation bilaterally Heart: regular rate and rhythm, S1, S2 normal, no murmur, click, rub or gallop Abdomen: soft, non-tender; bowel sounds normal; no masses,  no organomegaly Extremities: extremities normal, atraumatic, no cyanosis or edema  Lab Results:  Basename 05/16/11 0500  NA 144  K 3.8  CL 114*  CO2 23  GLUCOSE 119*  BUN 36*  CREATININE 1.09  CALCIUM 8.7  MG --  PHOS --    Basename 05/18/11 0520 05/18/11 0156  WBC 8.3 7.2  NEUTROABS -- --  HGB 9.1* 8.3*  HCT 26.5* 24.0*  MCV 87.2 86.6  PLT 133* 128*   Micro Results: Recent Results (from the past 240 hour(s))  MRSA PCR SCREENING     Status: Normal   Collection Time   05/15/11  1:34 AM      Component Value Range Status Comment   MRSA by PCR NEGATIVE  NEGATIVE  Final    Assessment/Plan:  *GIB (gastrointestinal bleeding) *EGD does not reveal any source of bleeding. *Colonoscopy planned for 3/6   Anemia related to acute blood loss *S/p 3 units PRBC- Hgb now stable.  Thrombocytopenia *Improving and likely 2/2 acute bleeding; could be 2/2 Plavix was taking pre-admission *Follow CBC  ARF *Improving.  *Holding lasix.  *improved back to baseline- Baseline Cr is 1.2-1.3.   Low grade fever *UA negative  *TM past 24 hours 99.3 *  No leukocytosis   HYPERTENSION *Continue Lopressor and titrate up to home dose as BP tolerates   CHEST PAIN- h/o CAD and CABG *Resolved with blood transfusion.    Diabetes mellitus without mention of complication *Holding Lantus as she is only on clears. On sliding scale now.    PVD *Holding ASA and Plavix   Disposition * Transfer to non telemetry floor   LOS: 4 days   ELLIS,ALLISON L. [phone] 05/18/2011, 11:34 AM  I have personally examined this patient and reviewed the entire database. I have reviewed the above note, made any necessary editorial changes, and agree with its content.  Cherene Altes, MD [facility]

## 2011-05-18 NOTE — Progress Notes (Signed)
     [facility] Daily Rounding Note 05/18/2011, 9:42 AM  SUBJECTIVE:       Feels well except c/o low back pain.  OBJECTIVE:        General: Looks well     Vital signs in last 24 hours:    Temp:  [98.4 F (36.9 C)-100.7 F (38.2 C)] 98.8 F (37.1 C) (03/04 0803) Pulse Rate:  [54-73] 72  (03/04 0900) Resp:  [17-23] 17  (03/04 0900) BP: (132-183)/(38-85) 161/85 mmHg (03/04 0900) SpO2:  [93 %-100 %] 100 % (03/04 0900) Weight:  [163 lb 2.3 oz (74 kg)] 163 lb 2.3 oz (74 kg) (03/04 0400) Last BM Date: 05/17/11  Heart: RRR Chest: ClearB Abdomen: soft, NT, ND active BS  Extremities: no pedal edema Neuro/Psych:  Pleasant, calm.  Not confused.   Intake/Output from previous day: 03/03 0701 - 03/04 0700 In: 1220 [P.O.:1020; I.V.:200] Out: 3000 [Urine:3000]  Intake/Output this shift:    Lab Results:  Basename 05/18/11 0520 05/18/11 0156 05/17/11 1334  WBC 8.3 7.2 10.0  HGB 9.1* 8.3* 9.6*  HCT 26.5* 24.0* 27.9*  PLT 133* 128* 144*   BMET  Basename 05/16/11 0500  NA 144  K 3.8  CL 114*  CO2 23  GLUCOSE 119*  BUN 36*  CREATININE 1.09  CALCIUM 8.7   ASSESMENT: 1.  Heme positive stool, normocytic Anemia (no Iron studies etc).  Black stool initially.  Now brown.  PUD in 2009. EGD 3/2:  Normal study to D3.  Needs colonoscopy off Plavix, planning for 3/6. S/P 3 units PRBCs.  2.  IDDM.    PLAN: 1.  Colonoscopy 3/6.  Begin prep tomorrow.  Stay on clears for now.     LOS: 4 days   Azucena Freed  05/18/2011, 9:42 AM Pager: [phone]   GI ATTENDING  CASE REVIEWED IN MORNING REPORT. SEEN AND EXAMINED. AGREE WITH H&P. NO ACTIVE GI BLEEDING. NEGATIVE EGD. HG STABLE. PLAN COLONOSCOPY Wednesday. IF NEGATIVE AND NO FURTHER BLEEDING, MAY NEED OUT PATIENT CAPSULE ENDOSCOPY.  Docia Chuck. Geri Seminole., M.D. [facility] Division of Gastroenterology

## 2011-05-19 DIAGNOSIS — E119 Type 2 diabetes mellitus without complications: Secondary | ICD-10-CM

## 2011-05-19 LAB — CBC
Hemoglobin: 8.8 g/dL — ABNORMAL LOW (ref 12.0–15.0)
MCH: 30 pg (ref 26.0–34.0)
MCHC: 33.8 g/dL (ref 30.0–36.0)
Platelets: 149 10*3/uL — ABNORMAL LOW (ref 150–400)
Platelets: 165 10*3/uL (ref 150–400)
RBC: 2.98 MIL/uL — ABNORMAL LOW (ref 3.87–5.11)
RBC: 2.83 MIL/uL — ABNORMAL LOW (ref 3.87–5.11)
RDW: 14.1 % (ref 11.5–15.5)
WBC: 7 10*3/uL (ref 4.0–10.5)

## 2011-05-19 LAB — GLUCOSE, CAPILLARY
Glucose-Capillary: 171 mg/dL — ABNORMAL HIGH (ref 70–99)
Glucose-Capillary: 157 mg/dL — ABNORMAL HIGH (ref 70–99)

## 2011-05-19 LAB — BASIC METABOLIC PANEL
CO2: 25 mEq/L (ref 19–32)
GFR calc non Af Amer: 43 mL/min — ABNORMAL LOW (ref >90)
Glucose, Bld: 159 mg/dL — ABNORMAL HIGH (ref 70–99)
Potassium: 4 mEq/L (ref 3.5–5.1)
Sodium: 139 mEq/L (ref 135–145)

## 2011-05-19 MED ORDER — ISOSORBIDE MONONITRATE ER 30 MG PO TB24
30.0000 mg | ORAL_TABLET | Freq: Every day | ORAL | Status: DC
  Administered 2011-05-19 – 2011-05-20 (×2): 30 mg via ORAL
  Filled 2011-05-19 (×3): qty 1

## 2011-05-19 MED ORDER — PEG-KCL-NACL-NASULF-NA ASC-C 100 G PO SOLR
1.0000 | Freq: Once | ORAL | Status: AC
  Administered 2011-05-19: 100 g via ORAL
  Filled 2011-05-19: qty 1

## 2011-05-19 NOTE — Progress Notes (Signed)
Utilization review completed.  

## 2011-05-19 NOTE — Progress Notes (Signed)
   CARE MANAGEMENT NOTE 05/19/2011  Patient:  Destiny [name], Destiny [name]   Account Number:  [id number]  Date Initiated:  05/19/2011  Documentation initiated by:  Tomi Bamberger  Subjective/Objective Assessment:   dx gib  admit- lives with daughter.  pta independent.     Action/Plan:   plan for colonscopy on 3/6   Anticipated DC Date:  05/21/2011   Anticipated DC Plan:  [place]  CM consult      Choice offered to / List presented to:             Status of service:  In process, will continue to follow Medicare Important Message given?   (If response is "NO", the following Medicare IM given date fields will be blank) Date Medicare IM given:   Date Additional Medicare IM given:    Discharge Disposition:    Per UR Regulation:    Comments:  PCP Daniel Nones  05/19/11 14:39 Tomi Bamberger RN, BSN [phone] Patient lives with daughter, pta independent.  Patient has medication coverage and transportation.  Patient scheduled for colonoscopy on 3/6.  NCM will continue to follow for dc needs.

## 2011-05-19 NOTE — Progress Notes (Signed)
     Destiny [name] 05/19/2011, 9:39 AM  SUBJECTIVE:       Had episode of painless hematochezia this AM.  Feels well except for pain in low back  OBJECTIVE:        General: pleasant, looks well.     Vital signs in last 24 hours:    Temp:  [98.6 F (37 C)-99.3 F (37.4 C)] 98.6 F (37 C) (03/05 0527) Pulse Rate:  [58-66] 60  (03/05 0527) Resp:  [18-19] 18  (03/05 0527) BP: (127-162)/(48-54) 144/48 mmHg (03/05 0527) SpO2:  [98 %-100 %] 100 % (03/05 0527) Last BM Date: 05/18/11  Heart: RRR, no MRG Chest: Clear B.  No resp distress of cough. Abdomen: soft, NT, ND.  BS active  Extremities: no pedal edema Neuro/Psych:  Pleasant, not agitated or confused.  Intake/Output from previous day: 03/04 0701 - 03/05 0700 In: 350 [P.O.:350] Out: 600 [Urine:600]  Intake/Output this shift:    Lab Results:  Basename 05/19/11 0525 05/18/11 1505 05/18/11 0520  WBC 8.1 10.0 8.3  HGB 8.8* 9.3* 9.1*  HCT 26.0* 27.1* 26.5*  PLT 149* 159 133*   BMET  Basename 05/19/11 0525  NA 139  K 4.0  CL 107  CO2 25  GLUCOSE 159*  BUN 13  CREATININE 1.19*  CALCIUM 9.2   ASSESMENT: 1. Heme positive stool, normocytic Anemia (no Iron studies etc). Black stool initially. Then brown.  This AM hematochezia.  PUD in 2009. EGD 3/2: Normal study to D3. Plan colonoscopy off Plavix 3/6. S/P 3 units PRBCs.  Hgb down slightly last 24 hours.  2.  DM.  On SS Insulin   PLAN: 1.  Colonoscopy tomorrow 11AM.  Moviprep orders placed.   2.  Ordered a CBC for later today.     LOS: 5 days   Azucena Freed  05/19/2011, 9:39 AM Pager: [phone]   [name] ATTENDING  SEEN AND EXAMINED. AGREE WITH ABOVE. SAW PT THIS EVENING PRIOR TO STARTING PREP. COLONOSCOPY IN AM OFF PLAVIX.The nature of the procedure, as well as the risks, benefits, and alternatives were carefully and thoroughly reviewed with the patient. Ample time for discussion and questions allowed. The patient understood, was satisfied, and agreed to  proceed.   Docia Chuck. Geri Seminole., M.D. [facility] Division of Gastroenterology

## 2011-05-19 NOTE — Progress Notes (Signed)
Subjective: Patient seen and examined this morning. Denies any symptoms. No overnight issues. transferred from stepdown on 3/4  Objective:  Vital signs in last 24 hours:  Filed Vitals:   05/18/11 1212 05/18/11 2113 05/19/11 0527 05/19/11 1256  BP: 127/53 162/54 144/48 146/53  Pulse: 58 66 60 86  Temp: 99.3 F (37.4 C) 98.8 F (37.1 C) 98.6 F (37 C) 97.8 F (36.6 C)  TempSrc: Oral     Resp: 19 18 18 18   Height:      Weight:      SpO2: 98% 100% 100% 97%    Intake/Output from previous day:  No intake or output data in the 24 hours ending 05/19/11 1545  Physical Exam:   General appearance: elderly female in NAD Lungs: clear to auscultation bilaterally  Heart: regular rate and rhythm, S1, S2 normal, no murmur, click, rub or gallop  Abdomen: soft, non-tender; bowel sounds normal; no masses, no organomegaly  Extremities: extremities normal, atraumatic, no cyanosis or edema CNS: AAOX3, non focal   Lab Results:  Basic Metabolic Panel:    Component Value Date/Time   NA 139 05/19/2011 0525   K 4.0 05/19/2011 0525   CL 107 05/19/2011 0525   CO2 25 05/19/2011 0525   BUN 13 05/19/2011 0525   CREATININE 1.19* 05/19/2011 0525   CREATININE 1.36* 05/06/2011 1102   GLUCOSE 159* 05/19/2011 0525   CALCIUM 9.2 05/19/2011 0525   CBC:    Component Value Date/Time   WBC 8.1 05/19/2011 0525   HGB 8.8* 05/19/2011 0525   HCT 26.0* 05/19/2011 0525   PLT 149* 05/19/2011 0525   MCV 87.2 05/19/2011 0525   NEUTROABS 6.4 05/14/2011 2117   LYMPHSABS 2.4 05/14/2011 2117   MONOABS 1.0 05/14/2011 2117   EOSABS 0.2 05/14/2011 2117   BASOSABS 0.0 05/14/2011 2117    Recent Results (from the past 240 hour(s))  MRSA PCR SCREENING     Status: Normal   Collection Time   05/15/11  1:34 AM      Component Value Range Status Comment   MRSA by PCR NEGATIVE  NEGATIVE  Final   URINE CULTURE     Status: Normal   Collection Time   05/17/11  5:26 PM      Component Value Range Status Comment   Specimen Description URINE, RANDOM    Final    Special Requests NONE   Final    Culture  Setup Time [id number]   Final    Colony Count 85,000 COLONIES/ML   Final    Culture     Final    Value: Multiple bacterial morphotypes present, none predominant. Suggest appropriate recollection if clinically indicated.   Report Status 05/18/2011 FINAL   Final     Studies/Results: No results found.  Medications: Scheduled Meds:   . antiseptic oral rinse  15 mL Mouth Rinse q12n4p  . chlorhexidine  15 mL Mouth Rinse BID  . insulin aspart  0-5 Units Subcutaneous QHS  . insulin aspart  0-9 Units Subcutaneous TID WC  . memantine  10 mg Oral BID  . metoprolol tartrate  25 mg Oral BID  . pantoprazole  40 mg Oral Q1200  . peg 3350 powder  1 kit Oral Once   Continuous Infusions:  PRN Meds:.acetaminophen, morphine, nitroGLYCERIN, ondansetron (ZOFRAN) IV, ondansetron, oxyCODONE  Assessment 78 y/o female with CAD, PVD , HTN, HL and hx of duodenal ulcers presets with generalized chest discomfort and weakness with dark stools and anemia.  Plan: *GIB (gastrointestinal bleeding)  *  EGD done by [facility] does not reveal any source of bleeding.  *Colonoscopy planned for tomorrow ( 3/6) NPO after midnight Cont PPI   Anemia related to acute blood loss  *S/p 3 units PRBC- Hgb now stable.   Thrombocytopenia  *Improving and likely secondary to  acute bleeding;  Monitor cbc  AKI- resolved Present on admission Likely in the setting of GI bleed and lasix use Holding lasix  renal fn back to baseline   HYPERTENSION  *Continue Lopressor and imdur  Chest pain  ruled out for ACS - h/o CAD and CABG  *Resolved with blood transfusion.   Diabetes mellitus without mention of complication  *Holding Lantus as she is only on clears and NPO after midnight. On sliding scale now.   PVD  *Holding ASA and Plavix   Consult: [facility]  For colonoscopy in the morning  Discharge plan following colonoscopy results      LOS: 5 days    Destiny [name] 05/19/2011, 3:45 PM

## 2011-05-19 NOTE — Progress Notes (Signed)
Inpatient Diabetes Program Recommendations  AACE/ADA: New Consensus Statement on Inpatient Glycemic Control  Target Ranges:  Prepandial:   less than 140 mg/dL      Peak postprandial:   less than 180 mg/dL (1-2 hours)      Critically ill patients:  140 - 180 mg/dL   Reason for Visit: Elevated prandial glucose  Results for Destiny [name], Destiny [name] (MRN [id number]) as of 05/19/2011 10:55  Ref. Range 05/18/2011 08:11 05/18/2011 12:10 05/18/2011 16:31 05/18/2011 21:11  Glucose-Capillary Latest Range: 70-99 mg/dL 169 (H) 219 (H) 203 (H) 161 (H)    Inpatient Diabetes Program Recommendations Insulin - Meal Coverage: Consider adding meal coverage

## 2011-05-20 ENCOUNTER — Encounter (HOSPITAL_COMMUNITY): Payer: Self-pay | Admitting: Internal Medicine

## 2011-05-20 ENCOUNTER — Encounter: Payer: Self-pay | Admitting: Internal Medicine

## 2011-05-20 ENCOUNTER — Encounter (HOSPITAL_COMMUNITY)
Admission: EM | Disposition: A | Discharge status: Home / Self Care | Payer: Self-pay | Source: Home / Self Care | Attending: Internal Medicine

## 2011-05-20 HISTORY — PX: COLONOSCOPY: SHX5424

## 2011-05-20 LAB — CBC
Hemoglobin: 7.8 g/dL — ABNORMAL LOW (ref 12.0–15.0)
MCH: 29.8 pg (ref 26.0–34.0)
MCHC: 34.5 g/dL (ref 30.0–36.0)
MCV: 86.3 fL (ref 78.0–100.0)
Platelets: 197 10*3/uL (ref 150–400)
RDW: 14.3 % (ref 11.5–15.5)

## 2011-05-20 LAB — GLUCOSE, CAPILLARY
Glucose-Capillary: 144 mg/dL — ABNORMAL HIGH (ref 70–99)
Glucose-Capillary: 153 mg/dL — ABNORMAL HIGH (ref 70–99)

## 2011-05-20 SURGERY — COLONOSCOPY
Anesthesia: Moderate Sedation

## 2011-05-20 MED ORDER — MIDAZOLAM HCL 10 MG/2ML IJ SOLN
INTRAMUSCULAR | Status: AC
  Filled 2011-05-20: qty 2

## 2011-05-20 MED ORDER — FENTANYL CITRATE 0.05 MG/ML IJ SOLN
INTRAMUSCULAR | Status: AC
  Filled 2011-05-20: qty 2

## 2011-05-20 MED ORDER — FENTANYL NICU IV SYRINGE 50 MCG/ML
INJECTION | INTRAMUSCULAR | Status: DC | PRN
  Administered 2011-05-20: 15 ug via INTRAVENOUS
  Administered 2011-05-20: 10 ug via INTRAVENOUS
  Administered 2011-05-20: 15 ug via INTRAVENOUS
  Administered 2011-05-20: 10 ug via INTRAVENOUS
  Administered 2011-05-20: 25 ug via INTRAVENOUS

## 2011-05-20 MED ORDER — MIDAZOLAM HCL 5 MG/5ML IJ SOLN
INTRAMUSCULAR | Status: DC | PRN
  Administered 2011-05-20: 1 mg via INTRAVENOUS
  Administered 2011-05-20: 2 mg via INTRAVENOUS
  Administered 2011-05-20 (×2): 1 mg via INTRAVENOUS

## 2011-05-20 MED ORDER — CLOPIDOGREL BISULFATE 75 MG PO TABS
75.0000 mg | ORAL_TABLET | Freq: Every day | ORAL | Status: DC
  Administered 2011-05-21: 75 mg via ORAL
  Filled 2011-05-20 (×3): qty 1

## 2011-05-20 NOTE — Progress Notes (Signed)
Physical Therapy Evaluation Patient Details Name: Destiny [name] MRN: [id number] DOB: 1932/03/04 Today's Date: 05/20/2011  Problem List:  Patient Active Problem List  Diagnoses  . DIABETES MELLITUS, TYPE II  . DIABETIC PERIPHERAL NEUROPATHY  . HYPERLIPIDEMIA  . HYPERNATREMIA  . HYPERKALEMIA  . ANEMIA, SECONDARY TO ACUTE BLOOD LOSS  . DEMENTIA  . AMAUROSIS FUGAX  . HYPERTENSION  . CORONARY ARTERY DISEASE  . CVA  . Unspecified Peripheral Vascular Disease  . DUODENAL ULCER, ACUTE, HEMORRHAGE  . ACHLORHYDRIA  . RENAL INSUFFICIENCY  . CALLUS  . WEAKNESS  . MURMUR  . CAROTID BRUIT  . CHEST PAIN  . DIARRHEA  . HIP PAIN, RIGHT  . Right leg pain  . Diabetes mellitus without mention of complication  . Hyperlipidemia  . Dementia  . B12 deficiency  . Fecal incontinence  . URI (upper respiratory infection)  . GIB (gastrointestinal bleeding)    Past Medical History:  Past Medical History  Diagnosis Date  . Undiagnosed cardiac murmurs   . PVD (peripheral vascular disease)   . CAD (coronary artery disease)   . Hypertension   . Hypernatremia   . Hyperlipidemia   . Duodenal ulcer     acute hemorrhage  . Hyperkalemia   . Diarrhea   . Achlorhydria   . Iron deficiency anemia secondary to blood loss (chronic)   . Weakness   . Callus   . Amaurosis fugax   . Carotid bruit   . Diabetes mellitus type II   . Dementia   . Diabetic retinopathy   . Gallstones 2009    seen on CT scan   Past Surgical History:  Past Surgical History  Procedure Date  . Coronary artery bypass graft 1999  . Abdominal hysterectomy   . Upper gastrointestinal endoscopy 02/22/2008    w/biopsy, duedenal ulcer, antrum erosions  . Esophagogastroduodenoscopy 05/16/2011    Procedure: ESOPHAGOGASTRODUODENOSCOPY (EGD);  Surgeon: Gatha Mayer, MD;  [facility] ENDOSCOPY;  Service: Endoscopy;  Laterality: N/A;    PT Assessment/Plan/Recommendation PT Assessment Clinical Impression Statement: 78 yo female  admitted with GIB presents to PT with some balance defecits that warrant more detailed assessment; will benefit from PT services to help increase activity level, assess balance, prep for dc home Patient will benefit from Outpatient PT follow up for balance assessment as well; will need prescription fro Outpatient PT PT Recommendation/Assessment: Patient will need skilled PT in the acute care venue PT Problem List: Decreased balance PT Therapy Diagnosis :  (Potential balance dysfunction) PT Plan PT Frequency: Min 2X/week (Likely only needs one more PT session) PT Treatment/Interventions: Gait training;Balance training PT Recommendation Follow Up Recommendations: Outpatient PT Equipment Recommended: None recommended by PT PT Goals  Acute Rehab PT Goals PT Goal Formulation: With family Time For Goal Achievement: 7 days Pt will Ambulate: >150 feet;with modified independence;with least restrictive assistive device (versus no assistive device without loss of balance) PT Goal: Ambulate - Progress: Goal set today Pt will Go Up / Down Stairs: 3-5 stairs;with modified independence (without loss of balance) PT Goal: Up/Down Stairs - Progress: Goal set today Additional Goals Additional Goal #1: PT will score greater than 50/56 on Berg Balance Assessment and/or 22/24 on DGI to demonstrate decr risk of falls PT Goal: Additional Goal #1 - Progress: Goal set today  PT Evaluation Precautions/Restrictions  Restrictions Weight Bearing Restrictions: No Prior Functioning  Home Living Lives With: Daughter Receives Help From: Family Type of Home: House Home Layout: One level Home Access: Stairs to enter  Entrance Stairs-Rails: None Entrance Stairs-Number of Steps: 3 (third step is quite steep) Home Adaptive Equipment: None Prior Function Level of Independence: Independent with homemaking with ambulation Able to Take Stairs?: Yes Driving: Yes Comments: Very independent  PTA Cognition Cognition Arousal/Alertness: Awake/alert Overall Cognitive Status: Appears within functional limits for tasks assessed Orientation Level: Oriented X4 Sensation/Coordination Sensation Light Touch:  (Son wonders about diabetic neuropathy) Additional Comments: Foot and LE sensation NT as pt was going to Endo Coordination Gross Motor Movements are Fluid and Coordinated: Yes Fine Motor Movements are Fluid and Coordinated: Yes Extremity Assessment RUE Assessment RUE Assessment: Within Functional Limits LUE Assessment LUE Assessment: Within Functional Limits RLE Assessment RLE Assessment: Within Functional Limits LLE Assessment LLE Assessment: Within Functional Limits Mobility (including Balance) Bed Mobility Bed Mobility: Yes Supine to Sit: 6: Modified independent (Device/Increase time);With rails Transfers Transfers: Yes Sit to Stand: 5: Supervision;With upper extremity assist Sit to Stand Details (indicate cue type and reason): cues for safety Stand to Sit: 4: Min assist (guard assist) Stand to Sit Details: cues for safety, control Ambulation/Gait Ambulation/Gait: Yes Ambulation/Gait Assistance: 4: Min assist (guard assist with and without physical contact) Ambulation/Gait Assistance Details (indicate cue type and reason): Overall ambulating well, with and without handheld assist; noted one loss of balance associated with a turn to the right -- pt was able to recover without physical assist Ambulation Distance (Feet): 65 Feet Assistive device: 1 person hand held assist;None Gait Pattern: Within Functional Limits Gait velocity: slow, especially without UE suport  Posture/Postural Control Posture/Postural Control: No significant limitations    End of Session PT - End of Session Activity Tolerance: Patient tolerated treatment well Patient left: with family/visitor present (in [facility] with transporter on the way to Endo) Nurse Communication: Mobility status for  ambulation General Behavior During Session: [facility] for tasks performed Cognition: [facility] for tasks performed  Roney Marion [facility] [place], [place]  05/20/2011, 1:52 PM

## 2011-05-20 NOTE — Progress Notes (Signed)
Subjective Patient back from colonoscopy Awaiting lunch, no complaints of SOB or CP   Objective: Vital signs in last 24 hours: Filed Vitals:   05/19/11 0527 05/19/11 1256 05/19/11 2233 05/20/11 0617  BP: 144/48 146/53 135/67 128/57  Pulse: 60 86 75 58  Temp: 98.6 F (37 C) 97.8 F (36.6 C) 98 F (36.7 C) 97.8 F (36.6 C)  TempSrc:      Resp: 18 18 20 19   Height:      Weight:      SpO2: 100% 97% 97% 96%   Weight change:   Intake/Output Summary (Last 24 hours) at 05/20/11 1025 Last data filed at 05/19/11 2312  Gross per 24 hour  Intake      0 ml  Output      3 ml  Net     -3 ml    Physical Exam: General: Awake, Oriented, No acute distress. HEENT: EOMI. Neck: Supple CV: S1 and S2 Lungs: Clear to ascultation bilaterally Abdomen: Soft, Nontender, min soft distention, +bowel sounds., obese Ext: Good pulses. Trace edema.   Lab Results:  [facility] 05/19/11 0525  NA 139  K 4.0  CL 107  CO2 25  GLUCOSE 159*  BUN 13  CREATININE 1.19*  CALCIUM 9.2  MG --  PHOS --   No results found for this basename: AST:2,ALT:2,ALKPHOS:2,BILITOT:2,PROT:2,ALBUMIN:2 in the last 72 hours No results found for this basename: LIPASE:2,AMYLASE:2 in the last 72 hours  Basename 05/20/11 0605 05/19/11 1619  WBC 7.4 7.0  NEUTROABS -- --  HGB 7.8* 8.5*  HCT 22.6* 24.6*  MCV 86.3 86.9  PLT 197 165   No results found for this basename: CKTOTAL:3,CKMB:3,CKMBINDEX:3,TROPONINI:3 in the last 72 hours No components found with this basename: POCBNP:3 No results found for this basename: DDIMER:2 in the last 72 hours No results found for this basename: HGBA1C:2 in the last 72 hours No results found for this basename: CHOL:2,HDL:2,LDLCALC:2,TRIG:2,CHOLHDL:2,LDLDIRECT:2 in the last 72 hours No results found for this basename: TSH,T4TOTAL,FREET3,T3FREE,THYROIDAB in the last 72 hours No results found for this basename: VITAMINB12:2,FOLATE:2,FERRITIN:2,TIBC:2,IRON:2,RETICCTPCT:2 in the last 72  hours  Micro Results: Recent Results (from the past 240 hour(s))  MRSA PCR SCREENING     Status: Normal   Collection Time   05/15/11  1:34 AM      Component Value Range Status Comment   MRSA by PCR NEGATIVE  NEGATIVE  Final   URINE CULTURE     Status: Normal   Collection Time   05/17/11  5:26 PM      Component Value Range Status Comment   Specimen Description URINE, RANDOM   Final    Special Requests NONE   Final    Culture  Setup Time [id number]   Final    Colony Count 85,000 COLONIES/ML   Final    Culture     Final    Value: Multiple bacterial morphotypes present, none predominant. Suggest appropriate recollection if clinically indicated.   Report Status 05/18/2011 FINAL   Final     Studies/Results: No results found.  Medications: I have reviewed the patient's current medications. Scheduled Meds:   . antiseptic oral rinse  15 mL Mouth Rinse q12n4p  . chlorhexidine  15 mL Mouth Rinse BID  . insulin aspart  0-5 Units Subcutaneous QHS  . insulin aspart  0-9 Units Subcutaneous TID WC  . isosorbide mononitrate  30 mg Oral Daily  . memantine  10 mg Oral BID  . metoprolol tartrate  25 mg Oral BID  . pantoprazole  40 mg Oral Q1200  . peg 3350 powder  1 kit Oral Once   Continuous Infusions:  PRN Meds:.acetaminophen, morphine, nitroGLYCERIN, ondansetron (ZOFRAN) IV, ondansetron, oxyCODONE  Assessment/Plan: 78 y/o female with CAD, PVD , HTN, HL and hx of duodenal ulcers presets with generalized chest discomfort and weakness with dark stools and anemia.   Plan:  *GIB (gastrointestinal bleeding)  *EGD done by [facility] does not reveal any source of bleeding.  *Colonoscopy today ( 3/6)   Cont PPI  Restart plavix  Anemia related to acute blood loss  *S/p 3 units PRBC- Hgb now stable.   Thrombocytopenia  *Improving and likely secondary to acute bleeding;  Monitor cbc   AKI- resolved  Present on admission  Likely in the setting of GI bleed and lasix use  Holding lasix   renal fn back to baseline   HYPERTENSION  *Continue Lopressor and imdur   Chest pain  ruled out for ACS  - h/o CAD and CABG  *Resolved with blood transfusion.   Diabetes mellitus without mention of complication  lantus/SSI   PVD  *Holding ASA and Plavix  Consult: [facility] for D/C tomm     LOS: 6 days  Raylei Losurdo, DO 05/20/2011, 10:25 AM

## 2011-05-20 NOTE — Op Note (Signed)
[facility] [place], [place]  [address]  COLONOSCOPY PROCEDURE REPORT  PATIENT:  Destiny, [name]  MR#:  [id number] BIRTHDATE:  Nov 03, 1931, 80 yrs. old  GENDER:  female ENDOSCOPIST:  Docia Chuck. Geri Seminole, MD REF. BY:  [facility], PROCEDURE DATE:  05/20/2011 PROCEDURE:  Diagnostic Colonoscopy ASA CLASS:  Class III INDICATIONS:  Anemia, heme positive stool, ? melena (Negtive EGD)  MEDICATIONS:   Fentanyl 75 mcg IV, Versed 6 mg IV  DESCRIPTION OF PROCEDURE:   After the risks benefits and alternatives of the procedure were thoroughly explained, informed consent was obtained.  Digital rectal exam was performed and revealed no abnormalities.   The Pentax Colonoscope [id number] endoscope was introduced through the anus and advanced to the cecum, which was identified by both the appendix and ileocecal valve, without limitations.  The quality of the prep was good, using MoviPrep.  The instrument was then slowly withdrawn as the colon was fully examined. <<PROCEDUREIMAGES>>  FINDINGS:  Severe diverticulosis was found throughout the colon. Otherwise normal colonoscopy without other polyps, masses, vascular ectasias, or inflammatory changes.   Retroflexed views in the rectum revealed internal hemorrhoids.    The time to cecum = minutes. The scope was then withdrawn in  minutes from the cecum and the procedure completed.  COMPLICATIONS:  None  ENDOSCOPIC IMPRESSION: 1) Severe diverticulosis throughout the colon. NO BLOOD OR BLEEDING 2) Otherwise normal colonoscopy 3) Internal hemorrhoids  RECOMMENDATIONS: 1)  ADVANCE DIET 2)  RESTART PLAVIX TODAY 3)  OFFIE VSIT WITH DR GESSNER ON TUES. June 09, 2011 AT 11:30 AM... .MAY NEED CAPSULE ENDOSCOPY.   DISCUSSED WITH PT AND FAMILY   ______________________________ Docia Chuck. Geri Seminole, MD  CC:  Encarnacion Slates MD;  Silvano Rusk, MD  The Patient  n. eSIGNED:   Docia Chuck. Geri Seminole at 05/20/2011 12:21  PM  Darene Lamer, [id number]

## 2011-05-20 NOTE — Interval H&P Note (Signed)
History and Physical Interval Note:  05/20/2011 11:25 AM  Destiny [name]  has presented today for surgery, with the diagnosis of anemia, heme positive stools  The various methods of treatment have been discussed with the patient and family. After consideration of risks, benefits and other options for treatment, the patient has consented to  Procedure(s) (LRB): COLONOSCOPY (N/A) as a surgical intervention .  The patients' history has been reviewed, patient examined, no change in status, stable for surgery.  I have reviewed the patients' chart and labs.  Questions were answered to the patient's satisfaction.     Scarlette Shorts

## 2011-05-20 NOTE — H&P (View-Only) (Signed)
     [facility] Daily Rounding Note 05/18/2011, 9:42 AM  SUBJECTIVE:       Feels well except c/o low back pain.  OBJECTIVE:        General: Looks well     Vital signs in last 24 hours:    Temp:  [98.4 F (36.9 C)-100.7 F (38.2 C)] 98.8 F (37.1 C) (03/04 0803) Pulse Rate:  [54-73] 72  (03/04 0900) Resp:  [17-23] 17  (03/04 0900) BP: (132-183)/(38-85) 161/85 mmHg (03/04 0900) SpO2:  [93 %-100 %] 100 % (03/04 0900) Weight:  [163 lb 2.3 oz (74 kg)] 163 lb 2.3 oz (74 kg) (03/04 0400) Last BM Date: 05/17/11  Heart: RRR Chest: ClearB Abdomen: soft, NT, ND active BS  Extremities: no pedal edema Neuro/Psych:  Pleasant, calm.  Not confused.   Intake/Output from previous day: 03/03 0701 - 03/04 0700 In: 1220 [P.O.:1020; I.V.:200] Out: 3000 [Urine:3000]  Intake/Output this shift:    Lab Results:  Basename 05/18/11 0520 05/18/11 0156 05/17/11 1334  WBC 8.3 7.2 10.0  HGB 9.1* 8.3* 9.6*  HCT 26.5* 24.0* 27.9*  PLT 133* 128* 144*   BMET  Basename 05/16/11 0500  NA 144  K 3.8  CL 114*  CO2 23  GLUCOSE 119*  BUN 36*  CREATININE 1.09  CALCIUM 8.7   ASSESMENT: 1.  Heme positive stool, normocytic Anemia (no Iron studies etc).  Black stool initially.  Now brown.  PUD in 2009. EGD 3/2:  Normal study to D3.  Needs colonoscopy off Plavix, planning for 3/6. S/P 3 units PRBCs.  2.  IDDM.    PLAN: 1.  Colonoscopy 3/6.  Begin prep tomorrow.  Stay on clears for now.     LOS: 4 days   Azucena Freed  05/18/2011, 9:42 AM Pager: [phone]   GI ATTENDING  CASE REVIEWED IN MORNING REPORT. SEEN AND EXAMINED. AGREE WITH H&P. NO ACTIVE GI BLEEDING. NEGATIVE EGD. HG STABLE. PLAN COLONOSCOPY Wednesday. IF NEGATIVE AND NO FURTHER BLEEDING, MAY NEED OUT PATIENT CAPSULE ENDOSCOPY.  Docia Chuck. Geri Seminole., M.D. [facility] Division of Gastroenterology

## 2011-05-21 ENCOUNTER — Encounter (HOSPITAL_COMMUNITY): Payer: Self-pay | Admitting: Internal Medicine

## 2011-05-21 LAB — CBC
MCH: 29.7 pg (ref 26.0–34.0)
MCHC: 34.1 g/dL (ref 30.0–36.0)
MCV: 87.1 fL (ref 78.0–100.0)
Platelets: 239 10*3/uL (ref 150–400)
RBC: 3.03 MIL/uL — ABNORMAL LOW (ref 3.87–5.11)

## 2011-05-21 MED ORDER — INSULIN GLARGINE 100 UNIT/ML ~~LOC~~ SOLN: 25.0000 [IU] | Freq: Every day | SUBCUTANEOUS | Status: DC

## 2011-05-21 MED ORDER — INSULIN LISPRO 100 UNIT/ML ~~LOC~~ SOLN: 10.0000 [IU] | Freq: Three times a day (TID) | SUBCUTANEOUS | Status: DC

## 2011-05-21 MED ORDER — METOPROLOL TARTRATE 25 MG PO TABS: 12.5000 mg | ORAL_TABLET | Freq: Two times a day (BID) | ORAL | Status: DC

## 2011-05-21 NOTE — Discharge Summary (Addendum)
Discharge Summary  Destiny [name] MR#: [id number]  DOB:[birth year]  Date of Admission: 05/14/2011 Date of Discharge: 05/21/2011  Patient's PCP: Jeb Levering, Philbert Riser, MD, MD  Attending Physician:Jaya Lapka  Consults:   GI  Discharge Diagnoses: Principal Problem:  *GIB (gastrointestinal bleeding)- no source found, plan outpatient capsule endoscopy Active Problems:  HYPERTENSION  CHEST PAIN  Diabetes mellitus without mention of complication   Brief Admitting History and Physical 78 Yo with multiple medical problems including history of Duodenal Ulcer with prior GI bleed here with generalized chest pain, no SOB. Has weakness and dizziness. Noted increased darkening of her stool in the last 2 weeks. She had a similar episode of Chest pain 2 weeks ago that got better but returned now. She has history of CAD but this does not feel like her usual chest pain. Had some nausea yesterday but that has gotten better.    Discharge Medications Medication List  As of 05/21/2011  1:38 PM   STOP taking these medications         metoprolol succinate 50 MG 24 hr tablet         TAKE these medications         amLODipine 10 MG tablet   Commonly known as: NORVASC   Take 10 mg by mouth daily.      aspirin 81 MG chewable tablet   Chew 162 mg by mouth 2 (two) times daily.      B-D ULTRAFINE III SHORT PEN 31G X 8 MM Misc   Generic drug: Insulin Pen Needle   by Does not apply route. Use 4 times daily for insulin injection      CENTRUM SILVER PO   Take 1 tablet by mouth daily.      clopidogrel 75 MG tablet   Commonly known as: PLAVIX   Take 75 mg by mouth daily.      colesevelam 625 MG tablet   Commonly known as: WELCHOL   Take 625-1,875 mg by mouth daily.      Fish Oil 1200 MG Caps   Take 2,400 mg by mouth 2 (two) times daily.      furosemide 20 MG tablet   Commonly known as: LASIX   Take 20 mg by mouth daily.      Glucosamine Chondr 500 Complex Caps   Take 1 capsule by mouth  daily.      glucose blood test strip   Use as instructed to check blood sugar three times a day Dx. 250.00      insulin glargine 100 UNIT/ML injection   Commonly known as: LANTUS   Inject 25 Units into the skin at bedtime.      insulin lispro 100 UNIT/ML injection   Commonly known as: HUMALOG   Inject 10 Units into the skin 3 (three) times daily before meals.      Iron 325 (65 FE) MG Tabs   Take 325 mg by mouth 2 (two) times daily.      isosorbide mononitrate 30 MG 24 hr tablet   Commonly known as: IMDUR   Take 30 mg by mouth daily.      memantine 10 MG tablet   Commonly known as: NAMENDA   Take 10 mg by mouth 2 (two) times daily.      metoprolol tartrate 25 MG tablet   Commonly known as: LOPRESSOR   Take 0.5 tablets (12.5 mg total) by mouth 2 (two) times daily.      nitroGLYCERIN 0.4 MG SL tablet  Commonly known as: NITROSTAT   Place 0.4 mg under the tongue every 5 (five) minutes as needed. For chest pain      pantoprazole 40 MG tablet   Commonly known as: PROTONIX   Take 40 mg by mouth daily.      quinapril 40 MG tablet   Commonly known as: ACCUPRIL   Take 40 mg by mouth at bedtime.      rosuvastatin 20 MG tablet   Commonly known as: CRESTOR   Take 20 mg by mouth daily.      Vitamin B-12 2500 MCG Subl   Place 2,500 mcg under the tongue daily.      Vitamin D 1000 UNITS capsule   Take 1,000 Units by mouth daily.            Hospital Course:   .GIB (gastrointestinal bleeding)- no obvious source found on colonoscopy, just: ) Severe diverticulosis throughout the colon. NO BLOOD OR  BLEEDING  2) Otherwise normal colonoscopy  3) Internal hemorrhoids With a plan for outpatient capsule endoscopy  .CHEST PAIN- rules out ACS, pain resolved with blood transfusions  .Diabetes mellitus without mention of complication- continue medications at a reduced amount  .HYPERTENSION- cont medications at a reduced amount until BP full recovers  AKI- held lasix and  creatinine improved, will restart and follow as outpatient  PVD: can resume ASA/plavix- instructed patient she could bleed from diverticuli at any time and to come back to ER if that happens   Day of Discharge BP 141/54  Pulse 55  Temp(Src) 98.8 F (37.1 C) (Oral)  Resp 16  Ht 5\' 6"  (1.676 m)  Wt 74 kg (163 lb 2.3 oz)  BMI 26.33 kg/m2  SpO2 97% RRR, no murmur Clear ant +BS -c/c/e  Results for orders placed during the hospital encounter of 05/14/11 (from the past 48 hour(s))  CBC     Status: Abnormal   Collection Time   05/19/11  4:19 PM      Component Value Range Comment   WBC 7.0  4.0 - 10.5 (K/uL)    RBC 2.83 (*) 3.87 - 5.11 (MIL/uL)    Hemoglobin 8.5 (*) 12.0 - 15.0 (g/dL)    HCT 24.6 (*) 36.0 - 46.0 (%)    MCV 86.9  78.0 - 100.0 (fL)    MCH 30.0  26.0 - 34.0 (pg)    MCHC 34.6  30.0 - 36.0 (g/dL)    RDW 14.1  11.5 - 15.5 (%)    Platelets 165  150 - 400 (K/uL)   GLUCOSE, CAPILLARY     Status: Abnormal   Collection Time   05/19/11  4:22 PM      Component Value Range Comment   Glucose-Capillary 157 (*) 70 - 99 (mg/dL)    Comment 1 Documented in Chart      Comment 2 Notify RN     GLUCOSE, CAPILLARY     Status: Abnormal   Collection Time   05/19/11  9:51 PM      Component Value Range Comment   Glucose-Capillary 150 (*) 70 - 99 (mg/dL)   GLUCOSE, CAPILLARY     Status: Abnormal   Collection Time   05/20/11  5:57 AM      Component Value Range Comment   Glucose-Capillary 144 (*) 70 - 99 (mg/dL)   CBC     Status: Abnormal   Collection Time   05/20/11  6:05 AM      Component Value Range Comment   WBC 7.4  4.0 - 10.5 (K/uL)    RBC 2.62 (*) 3.87 - 5.11 (MIL/uL)    Hemoglobin 7.8 (*) 12.0 - 15.0 (g/dL)    HCT 22.6 (*) 36.0 - 46.0 (%)    MCV 86.3  78.0 - 100.0 (fL)    MCH 29.8  26.0 - 34.0 (pg)    MCHC 34.5  30.0 - 36.0 (g/dL)    RDW 14.3  11.5 - 15.5 (%)    Platelets 197  150 - 400 (K/uL)   GLUCOSE, CAPILLARY     Status: Abnormal   Collection Time   05/20/11 10:41 AM       Component Value Range Comment   Glucose-Capillary 153 (*) 70 - 99 (mg/dL)    Comment 1 Documented in Chart     GLUCOSE, CAPILLARY     Status: Abnormal   Collection Time   05/20/11  4:23 PM      Component Value Range Comment   Glucose-Capillary 221 (*) 70 - 99 (mg/dL)    Comment 1 Notify RN     GLUCOSE, CAPILLARY     Status: Abnormal   Collection Time   05/20/11  9:53 PM      Component Value Range Comment   Glucose-Capillary 159 (*) 70 - 99 (mg/dL)   GLUCOSE, CAPILLARY     Status: Abnormal   Collection Time   05/21/11  7:20 AM      Component Value Range Comment   Glucose-Capillary 190 (*) 70 - 99 (mg/dL)   CBC     Status: Abnormal   Collection Time   05/21/11  8:52 AM      Component Value Range Comment   WBC 7.6  4.0 - 10.5 (K/uL)    RBC 3.03 (*) 3.87 - 5.11 (MIL/uL)    Hemoglobin 9.0 (*) 12.0 - 15.0 (g/dL)    HCT 26.4 (*) 36.0 - 46.0 (%)    MCV 87.1  78.0 - 100.0 (fL)    MCH 29.7  26.0 - 34.0 (pg)    MCHC 34.1  30.0 - 36.0 (g/dL)    RDW 14.3  11.5 - 15.5 (%)    Platelets 239  150 - 400 (K/uL)   GLUCOSE, CAPILLARY     Status: Abnormal   Collection Time   05/21/11 11:25 AM      Component Value Range Comment   Glucose-Capillary 208 (*) 70 - 99 (mg/dL)    Comment 1 Notify RN       Dg Chest 2 View  05/14/2011  *RADIOLOGY REPORT*  Clinical Data: Chest pain  CHEST - 2 VIEW  Comparison: None.  Findings: Cardiomegaly.  Previous CABG.  No infiltrates or failure. No effusion or pneumothorax.  Degenerative change thoracic spine.  IMPRESSION: Cardiomegaly with CABG, no active infiltrates.  Little change from priors.  Original Report Authenticated By: Staci Righter, M.D.   Ct Head Wo Contrast  05/14/2011  *RADIOLOGY REPORT*  Clinical Data: Unsteady gait with chest pain.  CT HEAD WITHOUT CONTRAST  Technique:  Contiguous axial images were obtained from the base of the skull through the vertex without contrast.  Comparison: MRI brain 01/08/2011.  Findings: Mild atrophy with chronic microvascular  ischemic change. No acute stroke, acute hemorrhage, mass lesion, hydrocephalus, or extra-axial fluid.  Chronic infarctions of the left basal ganglia and right occipital lobe.  Vascular calcification.  Intact calvarium.  Clear sinuses and mastoids.  IMPRESSION: Atrophy and small vessel disease.  No acute intracranial findings.  Original Report Authenticated By: Staci Righter, M.D.  Disposition: home  Diet: cardiac/diabetic  Activity: as tolerated  Follow-up Appts: Discharge Orders    Future Appointments: Provider: Department: Dept Phone: Center:   05/27/2011 10:15 AM Wales., MD [place] [phone] [place]   06/09/2011 11:30 AM Gatha Mayer, MD [facility] [place] [place] Office [phone] [facility]     Future Orders Please Complete By Expires   Diet - low sodium heart healthy      Diet Carb Modified      Increase activity slowly      Discharge instructions      Comments:   With out patient PT   Care order/instruction      Scheduling Instructions:   Outpatient physical therapy for generalized weakness      TESTS THAT NEED FOLLOW-UP Outpatient capsule endoscopy  Time spent on discharge, talking to the patient, and coordinating care: 40 mins.   SignedEulogio Bear, DO 05/21/2011, 1:38 PM

## 2011-05-21 NOTE — Progress Notes (Signed)
Physical Therapy Treatment Patient Details Name: Destiny [name] MRN: [id number] DOB: 1931/09/21 Today's Date: 05/21/2011  PT Assessment/Plan  PT - Assessment/Plan Comments on Treatment Session: pt presents with GIB.  pt moving well and very motivated.  pt noting low back with with ambulation.  RN made aware.  Feel pt would still benefit from OPPT for high level balance.   PT Plan: Discharge plan remains appropriate;Frequency remains appropriate PT Frequency: Min 2X/week Follow Up Recommendations: Outpatient PT Equipment Recommended: None recommended by PT PT Goals  Acute Rehab PT Goals PT Goal: Ambulate - Progress: Progressing toward goal PT Goal: Up/Down Stairs - Progress: Progressing toward goal  PT Treatment Precautions/Restrictions  Precautions Precautions:  (None) Restrictions Weight Bearing Restrictions: No Mobility (including Balance) Bed Mobility Bed Mobility: No Transfers Transfers: Yes Sit to Stand: 6: Modified independent (Device/Increase time);With upper extremity assist;From chair/3-in-1;With armrests Sit to Stand Details (indicate cue type and reason): demos good technique, only requires increased time.   Stand to Sit: With upper extremity assist;With armrests;To chair/3-in-1;5: Supervision Stand to Sit Details: cues to control descent Ambulation/Gait Ambulation/Gait: Yes Ambulation/Gait Assistance: 5: Supervision Ambulation/Gait Assistance Details (indicate cue type and reason): pt with mild LOB x2 towards R side, with pt able to self-correct.   Ambulation Distance (Feet): 200 Feet Assistive device: None Gait Pattern: Within Functional Limits Stairs: Yes Stairs Assistance: 5: Supervision Stairs Assistance Details (indicate cue type and reason): cues for safety on stairs.   Stair Management Technique: One rail Left;Forwards Number of Stairs: 2  Wheelchair Mobility Wheelchair Mobility: No  Posture/Postural Control Posture/Postural Control: No significant  limitations Balance Balance Assessed: No Exercise    End of Session PT - End of Session Equipment Utilized During Treatment: Gait belt Activity Tolerance: Patient tolerated treatment well Patient left: in chair;with call bell in reach Nurse Communication: Mobility status for ambulation General Behavior During Session: [facility] for tasks performed Cognition: [facility] for tasks performed  Catarina Hartshorn, [place] 05/21/2011, 11:52 AM

## 2011-05-21 NOTE — Progress Notes (Signed)
   CARE MANAGEMENT NOTE 05/21/2011  Patient:  Destiny [name], Destiny [name]   Account Number:  [id number]  Date Initiated:  05/19/2011  Documentation initiated by:  Tomi Bamberger  Subjective/Objective Assessment:   dx gib  admit- lives with daughter.  pta independent.     Action/Plan:   plan for colonscopy on 3/6   Anticipated DC Date:  05/21/2011   Anticipated DC Plan:  [place]  CM consult  Outpatient Services - Pt will follow up      Choice offered to / List presented to:             Status of service:  Completed, signed off Medicare Important Message given?   (If response is "NO", the following Medicare IM given date fields will be blank) Date Medicare IM given:   Date Additional Medicare IM given:    Discharge Disposition:  HOME/SELF CARE  Per UR Regulation:    Comments:  PCP Daniel Nones  05/21/2011  10:45 am [place], CCM [phone] spoke with patient regarding outpatient PT post discharge. She did not have any preferances for a facility.  Provided list of outpatient PT offices.  She states her children can provide transportation for her.  MD to provide outpatient therapy prescription   05/19/11 14:39 Tomi Bamberger RN, BSN [phone] Patient lives with daughter, pta independent.  Patient has medication coverage and transportation.  Patient scheduled for colonoscopy on 3/6.  NCM will continue to follow for dc needs.

## 2011-05-25 ENCOUNTER — Ambulatory Visit: Payer: Medicare Other | Admitting: Internal Medicine

## 2011-05-25 DIAGNOSIS — Z0289 Encounter for other administrative examinations: Secondary | ICD-10-CM

## 2011-05-27 ENCOUNTER — Encounter: Payer: Self-pay | Admitting: Internal Medicine

## 2011-05-27 ENCOUNTER — Ambulatory Visit (INDEPENDENT_AMBULATORY_CARE_PROVIDER_SITE_OTHER): Payer: Medicare Other | Admitting: Internal Medicine

## 2011-05-27 VITALS — BP 148/60 | HR 50 | Temp 97.5°F | Resp 16 | Wt 161.0 lb

## 2011-05-27 DIAGNOSIS — D649 Anemia, unspecified: Secondary | ICD-10-CM

## 2011-05-27 DIAGNOSIS — E119 Type 2 diabetes mellitus without complications: Secondary | ICD-10-CM

## 2011-05-27 NOTE — Assessment & Plan Note (Signed)
Obtain cbc. Scheduled for gi f/u to consider possible capsule endoscopy

## 2011-05-27 NOTE — Progress Notes (Signed)
  Subjective:    Patient ID: Destiny [name], female    DOB: 10-18-1931, 78 y.o.   MRN: [id number]  HPI Pt presents to clinic for followup of multiple medical problems. Recently dc'ed from hospital after GIB. EGD/colonoscopy showed severe tics and IH but no active source of bleeding. S/p prbc transfusion with last hgb ~9. plavix temp held and resumed at dc. Has gi appt in 2wks. Denies abd pain or blood in stool. +fatigue. dc'ed with reduced dose of lantus and humulog. fsbs x one? Since dc ~200. No hypoglycemia.  Past Medical History  Diagnosis Date  . Undiagnosed cardiac murmurs   . PVD (peripheral vascular disease)   . CAD (coronary artery disease)   . Hypertension   . Hypernatremia   . Hyperlipidemia   . Duodenal ulcer     acute hemorrhage  . Hyperkalemia   . Diarrhea   . Achlorhydria   . Iron deficiency anemia secondary to blood loss (chronic)   . Weakness   . Callus   . Amaurosis fugax   . Carotid bruit   . Diabetes mellitus type II   . Dementia   . Diabetic retinopathy   . Gallstones 2009    seen on CT scan   Past Surgical History  Procedure Date  . Coronary artery bypass graft 1999  . Abdominal hysterectomy   . Upper gastrointestinal endoscopy 02/22/2008    w/biopsy, duedenal ulcer, antrum erosions  . Esophagogastroduodenoscopy 05/16/2011    Procedure: ESOPHAGOGASTRODUODENOSCOPY (EGD);  Surgeon: Gatha Mayer, MD;  Location: [facility] ENDOSCOPY;  Service: Endoscopy;  Laterality: N/A;  . Colonoscopy 05/20/2011    Procedure: COLONOSCOPY;  Surgeon: Scarlette Shorts, MD;  Location: [facility];  Service: Endoscopy;  Laterality: N/A;    reports that she quit smoking about 10 years ago. Her smoking use included Cigarettes. She has never used smokeless tobacco. She reports that she does not drink alcohol or use illicit drugs. family history includes Coronary artery disease in an unspecified family member; Hypertension in an unspecified family member; and Stroke in an unspecified family  member. No Known Allergies    Review of Systems see hpi     Objective:   Physical Exam  Nursing note and vitals reviewed. Constitutional: She appears well-developed and well-nourished. No distress.  HENT:  Head: Normocephalic and atraumatic.  Neurological: She is alert.  Skin: Skin is warm and dry. She is not diaphoretic.  Psychiatric: She has a normal mood and affect.          Assessment & Plan:

## 2011-05-27 NOTE — Assessment & Plan Note (Signed)
Continue fsbs log. Increase lantus to 30units from 25. Schedule close f/u

## 2011-05-28 LAB — CBC WITH DIFFERENTIAL/PLATELET
Eosinophils Absolute: 0.2 10*3/uL (ref 0.0–0.7)
Eosinophils Relative: 3 % (ref 0–5)
Hemoglobin: 9.5 g/dL — ABNORMAL LOW (ref 12.0–15.0)
Lymphs Abs: 2 10*3/uL (ref 0.7–4.0)
MCH: 28.6 pg (ref 26.0–34.0)
MCV: 94.3 fL (ref 78.0–100.0)
Monocytes Relative: 10 % (ref 3–12)
Platelets: 359 10*3/uL (ref 150–400)
RBC: 3.32 MIL/uL — ABNORMAL LOW (ref 3.87–5.11)

## 2011-06-08 ENCOUNTER — Encounter: Payer: Self-pay | Admitting: *Deleted

## 2011-06-09 ENCOUNTER — Ambulatory Visit (INDEPENDENT_AMBULATORY_CARE_PROVIDER_SITE_OTHER): Payer: Medicare Other | Admitting: Internal Medicine

## 2011-06-09 ENCOUNTER — Encounter: Payer: Self-pay | Admitting: Internal Medicine

## 2011-06-09 VITALS — BP 124/52 | HR 56 | Ht 66.0 in | Wt 166.1 lb

## 2011-06-09 DIAGNOSIS — D500 Iron deficiency anemia secondary to blood loss (chronic): Secondary | ICD-10-CM

## 2011-06-09 DIAGNOSIS — K922 Gastrointestinal hemorrhage, unspecified: Secondary | ICD-10-CM

## 2011-06-09 NOTE — Progress Notes (Signed)
Patient ID: Destiny [name], female   DOB: [birth year], 78 y.o.   MRN: [id number]   This is a very pleasant 78 year old white woman who has had recurrent GI bleeding. In 2009 she bled from a duodenal ulcer. She had underlying anemia, did not present for followup colonoscopy. She then was admitted to the hospital within the last month or so with melena and GI bleeding. EGD by me and a colonoscopy by Dr. Henrene Pastor did not reveal a cause of the bleeding. She did have colonic diverticulosis. She is seen primary care and followup, she is taking iron, ferrous sulfate twice a day. Her hemoglobin is 9.5. She feels tired. No chest pain.  Medications, allergies, past medical history, past surgical history, family history and social history are reviewed and updated in the EMR.  Lab Results  Component Value Date   WBC 5.7 05/27/2011   HGB 9.5* 05/27/2011   HCT 31.3* 05/27/2011   MCV 94.3 05/27/2011   PLT 359 05/27/2011    Assessment:  1. GI bleed unrevealing EGD and colonoscopy February 2013   2. Iron deficiency anemia secondary to blood loss (chronic)    Recent GI bleeding, this is resolved. There is a background of suspected chronic blood loss anemia as well.  Plan:  Since upper GI endoscopy and colonoscopy were recently unrevealing, we'll schedule a small bowel capsule endoscopy. Risks benefits and indications were explained to the patient and she understands and agrees to proceed.

## 2011-06-09 NOTE — Patient Instructions (Signed)
We have set you up for an Endo Capsule test, please follow the instructions you have been given today.

## 2011-06-18 ENCOUNTER — Ambulatory Visit (INDEPENDENT_AMBULATORY_CARE_PROVIDER_SITE_OTHER): Payer: Medicare Other | Admitting: Gastroenterology

## 2011-06-18 DIAGNOSIS — K922 Gastrointestinal hemorrhage, unspecified: Secondary | ICD-10-CM

## 2011-06-18 NOTE — Progress Notes (Signed)
Capsule endoscopy completed, pt tolerated procedure well, no problems.  Lot# [id number]         25

## 2011-06-25 ENCOUNTER — Telehealth: Payer: Self-pay | Admitting: Internal Medicine

## 2011-06-25 NOTE — Telephone Encounter (Signed)
Let her know capsule endoscopy is normal. Not sure where bleeding was from but no further testing at this time.  1) stay on ferrous sulfate 2) keep follow-up with Dr. Elizebeth Koller and he can check bloood and iron levels 3) see me prn

## 2011-06-26 NOTE — Telephone Encounter (Signed)
No answer and no machine, Machine is full. I will continue to try and reach her

## 2011-06-29 ENCOUNTER — Ambulatory Visit: Payer: Medicare Other | Admitting: Internal Medicine

## 2011-06-29 NOTE — Telephone Encounter (Signed)
Patient advised.

## 2011-06-30 ENCOUNTER — Encounter: Payer: Self-pay | Admitting: Internal Medicine

## 2011-07-02 ENCOUNTER — Other Ambulatory Visit: Payer: Self-pay | Admitting: *Deleted

## 2011-07-02 DIAGNOSIS — I6529 Occlusion and stenosis of unspecified carotid artery: Secondary | ICD-10-CM

## 2011-07-03 ENCOUNTER — Encounter (INDEPENDENT_AMBULATORY_CARE_PROVIDER_SITE_OTHER): Payer: Medicare Other

## 2011-07-03 DIAGNOSIS — I6529 Occlusion and stenosis of unspecified carotid artery: Secondary | ICD-10-CM

## 2011-07-07 ENCOUNTER — Encounter: Payer: Self-pay | Admitting: Internal Medicine

## 2011-07-07 ENCOUNTER — Other Ambulatory Visit: Payer: Self-pay | Admitting: Internal Medicine

## 2011-07-07 ENCOUNTER — Ambulatory Visit (INDEPENDENT_AMBULATORY_CARE_PROVIDER_SITE_OTHER): Payer: Medicare Other | Admitting: Internal Medicine

## 2011-07-07 VITALS — BP 156/80 | HR 56 | Temp 98.0°F | Ht 66.0 in | Wt 166.0 lb

## 2011-07-07 DIAGNOSIS — F0390 Unspecified dementia without behavioral disturbance: Secondary | ICD-10-CM

## 2011-07-07 DIAGNOSIS — R350 Frequency of micturition: Secondary | ICD-10-CM

## 2011-07-07 DIAGNOSIS — D649 Anemia, unspecified: Secondary | ICD-10-CM

## 2011-07-07 DIAGNOSIS — E119 Type 2 diabetes mellitus without complications: Secondary | ICD-10-CM

## 2011-07-07 DIAGNOSIS — I10 Essential (primary) hypertension: Secondary | ICD-10-CM

## 2011-07-07 LAB — CBC
HCT: 36.3 % (ref 36.0–46.0)
MCHC: 32.5 g/dL (ref 30.0–36.0)
MCV: 88.3 fL (ref 78.0–100.0)
RDW: 13.9 % (ref 11.5–15.5)

## 2011-07-07 MED ORDER — MEMANTINE HCL 10 MG PO TABS: 10.0000 mg | ORAL_TABLET | Freq: Two times a day (BID) | ORAL | Status: DC

## 2011-07-07 MED ORDER — DONEPEZIL HCL 10 MG PO TABS: 10.0000 mg | ORAL_TABLET | Freq: Every day | ORAL | Status: DC

## 2011-07-07 NOTE — Assessment & Plan Note (Signed)
Obtain cbc and ferritin.

## 2011-07-07 NOTE — Assessment & Plan Note (Signed)
Resume dual therapy.

## 2011-07-07 NOTE — Patient Instructions (Signed)
Please schedule fasting labs prior to next visit Cbc-anemia, chem7, a1c-250.0 and lipid/lft 272.4

## 2011-07-07 NOTE — Assessment & Plan Note (Signed)
suboptimal control with primary contribution from noncompliance. No indication for medication titration without compliance. Reinforced need for regular dosing of medication as prescribed and daily monitoring of fsbs for review. States understanding.

## 2011-07-07 NOTE — Progress Notes (Signed)
  Subjective:    Patient ID: Destiny [name], female    DOB: [birth year], 78 y.o.   MRN: [id number]  HPI Pt presents to clinic for followup of multiple medical problems. No recent evidence of GIB. Denies blood in stool. Completed colonoscopy/EGD and recent capsule study without active source of bleeding. Reviewed suboptimal diabetic control. Last a1c 8.5 up from 7.3. Admits to medicatin noncompliance including insulin dosing. Also noncompliance with fsbs regularly. BP reviewed elevated without associated sx's. H/o memory loss followed by neurology. Pt and son state both aricept and namenda stopped by neurology due to lack of need. Since then has noted worsened memory and resumed namenda only. Reviewed safety issues in detail. No red flags for driving or home independence. Daughter lives at home. Notes recent (unknown duration) of urinary frequency and maladorous urine. Denies hematuria, fever chills or dysuria.   Past Medical History  Diagnosis Date  . Undiagnosed cardiac murmurs   . PVD (peripheral vascular disease)   . CAD (coronary artery disease)   . Hypertension   . Hypernatremia   . Hyperlipidemia   . Duodenal ulcer     acute hemorrhage  . Hyperkalemia   . Achlorhydria   . Iron deficiency anemia secondary to blood loss (chronic)   . Amaurosis fugax   . Carotid bruit   . Diabetes mellitus type II   . Dementia     pt denies  . Diabetic retinopathy   . Gallstones 2009    seen on CT scan  . Diverticulosis   . Atherosclerosis   . Renal insufficiency   . CVA (cerebral infarction)   . Diabetic peripheral neuropathy   . GI bleed 04/2011    Cause unclear   Past Surgical History  Procedure Date  . Coronary artery bypass graft 1999    x 4  . Abdominal hysterectomy   . Upper gastrointestinal endoscopy 02/22/2008    w/biopsy, duedenal ulcer, antrum erosions  . Esophagogastroduodenoscopy 05/16/2011    Procedure: ESOPHAGOGASTRODUODENOSCOPY (EGD);  Surgeon: Gatha Mayer, MD;  Location: [facility]  ENDOSCOPY;  Service: Endoscopy;  Laterality: N/A;  . Colonoscopy 05/20/2011    Procedure: COLONOSCOPY;  Surgeon: Scarlette Shorts, MD;  Location: [place];  Service: Endoscopy;  Laterality: N/A;    reports that she quit smoking about 10 years ago. Her smoking use included Cigarettes. She has never used smokeless tobacco. She reports that she does not drink alcohol or use illicit drugs. family history includes Cancer in her mother; Coronary artery disease in her father; Diabetes in her maternal uncle; Hypertension in her father; and Stroke in her father. No Known Allergies    Review of Systems see hpi     Objective:   Physical Exam  Physical Exam  Nursing note and vitals reviewed. Constitutional: Appears well-developed and well-nourished. No distress.  HENT:  Head: Normocephalic and atraumatic.  Right Ear: External ear normal.  Left Ear: External ear normal.  Eyes: Conjunctivae are normal. No scleral icterus.  Neck: Neck supple. Carotid bruit is not present.  Cardiovascular: Normal rate, regular rhythm and normal heart sounds.  Exam reveals no gallop and no friction rub.   No murmur heard. Pulmonary/Chest: Effort normal and breath sounds normal. No respiratory distress. He has no wheezes. no rales.  Lymphadenopathy:    He has no cervical adenopathy.  Neurological:Alert.  Skin: Skin is warm and dry. Not diaphoretic.  Psychiatric: Has a normal mood and affect.       Assessment & Plan:

## 2011-07-07 NOTE — Assessment & Plan Note (Signed)
Obtain ua

## 2011-07-07 NOTE — Assessment & Plan Note (Signed)
Reinforced need for compliance

## 2011-07-08 ENCOUNTER — Other Ambulatory Visit: Payer: Self-pay | Admitting: Internal Medicine

## 2011-07-08 LAB — URINALYSIS, MICROSCOPIC ONLY: WBC, UA: >50 WBC/hpf — AB (ref <3)

## 2011-07-08 LAB — URINALYSIS, ROUTINE W REFLEX MICROSCOPIC
Bilirubin Urine: NEGATIVE
Glucose, UA: NEGATIVE mg/dL
Protein, ur: >300 mg/dL — AB

## 2011-07-08 MED ORDER — CIPROFLOXACIN HCL 500 MG PO TABS: 500.0000 mg | ORAL_TABLET | Freq: Two times a day (BID) | ORAL | Status: DC

## 2011-07-11 LAB — URINE CULTURE: Colony Count: >=100000

## 2011-07-14 ENCOUNTER — Other Ambulatory Visit: Payer: Self-pay | Admitting: Internal Medicine

## 2011-07-14 MED ORDER — SULFAMETHOXAZOLE-TRIMETHOPRIM 800-160 MG PO TABS: 1.0000 | ORAL_TABLET | Freq: Two times a day (BID) | ORAL | Status: AC

## 2011-07-15 ENCOUNTER — Encounter: Payer: Self-pay | Admitting: *Deleted

## 2011-07-15 ENCOUNTER — Other Ambulatory Visit: Payer: Self-pay | Admitting: Internal Medicine

## 2011-07-15 ENCOUNTER — Other Ambulatory Visit: Payer: Self-pay | Admitting: *Deleted

## 2011-07-15 MED ORDER — INSULIN LISPRO 100 UNIT/ML ~~LOC~~ SOLN: 5.0000 [IU] | Freq: Three times a day (TID) | SUBCUTANEOUS | Status: DC

## 2011-07-15 MED ORDER — INSULIN GLARGINE 100 UNIT/ML ~~LOC~~ SOLN: 30.0000 [IU] | Freq: Every day | SUBCUTANEOUS | Status: DC

## 2011-07-15 NOTE — Telephone Encounter (Signed)
Rx refill sent to pharmacy. 

## 2011-07-15 NOTE — Telephone Encounter (Signed)
Patient requesting refill on Lantus and Humalog.

## 2011-07-19 NOTE — Progress Notes (Signed)
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

## 2011-07-22 ENCOUNTER — Telehealth: Payer: Self-pay | Admitting: Internal Medicine

## 2011-07-22 NOTE — Telephone Encounter (Signed)
Please call Lauralyn Primes at the [facility] at [place]. Patient was discharged from the hospital with metoprolol tartrate but Dr. Elizebeth Koller has patient on metroprolol succinate. Pharmacy needs to know which rx to fill for patient.

## 2011-07-22 NOTE — Telephone Encounter (Signed)
If you can tell which one she has been taking recently that is the one to refill

## 2011-07-22 NOTE — Telephone Encounter (Signed)
Destiny [name] 07/22/2011 11:01 AM Signed  Please call Lauralyn Primes at the [facility] [place]. Patient was discharged from the hospital with metoprolol tartrate but Dr. Elizebeth Koller has patient on metroprolol succinate. Pharmacy needs to know which rx to fill for patien  Last Rx order [from [facility]] was Metoprolol Succinate XR 50mg  24 tab Sig: Take 0.5 tab PO daily Last OV med reconciliation [from 04.23.13] was Metoprolol 25 mg Lopressor Sig: Take 0.5 tab PO BID  Please advise clarification to pharmacy. Thanks.

## 2011-07-23 ENCOUNTER — Telehealth: Payer: Self-pay | Admitting: *Deleted

## 2011-07-23 MED ORDER — METOPROLOL SUCCINATE ER 25 MG PO TB24: ORAL_TABLET | ORAL | Status: DC

## 2011-07-23 NOTE — Telephone Encounter (Signed)
Pharmacy fax received, requesting Toprol XL 25 mg 1/2 tablet every day.

## 2011-07-23 NOTE — Telephone Encounter (Signed)
Rx changed. Call placed to [facility]; Pharmacist [facility] made aware.

## 2011-07-23 NOTE — Telephone Encounter (Signed)
Voice message received from [facility]; pharmacist with [facility]. Her message stated that she contacted the family with the medication change from the succinate to tartrate ( Lopressor). The family would like for the patient to continue with taking the succinate, because the patient forgets to take the medication, and they feel she would be better controlled if she continues with the succinate. Brayton Layman would like to know if she could change the Rx from tartrated back to the succinate.

## 2011-07-23 NOTE — Telephone Encounter (Signed)
Call placed to pharmacy at [phone], spoke with [facility]. Clarification provided per Dr Elizebeth Koller instructions.

## 2011-07-23 NOTE — Telephone Encounter (Signed)
ok 

## 2011-08-06 ENCOUNTER — Telehealth: Payer: Self-pay | Admitting: *Deleted

## 2011-08-06 NOTE — Telephone Encounter (Signed)
Call placed to [place], spoke with Ailene Ravel. She was informed per Dr Beaulah Corin instructions patient should be taking Metoprol succinate  25 mg once a day. Medication update to reflect change.

## 2011-09-07 ENCOUNTER — Telehealth: Payer: Self-pay | Admitting: *Deleted

## 2011-09-07 DIAGNOSIS — D649 Anemia, unspecified: Secondary | ICD-10-CM

## 2011-09-07 DIAGNOSIS — E785 Hyperlipidemia, unspecified: Secondary | ICD-10-CM

## 2011-09-07 DIAGNOSIS — E119 Type 2 diabetes mellitus without complications: Secondary | ICD-10-CM

## 2011-09-07 LAB — HEPATIC FUNCTION PANEL
Alkaline Phosphatase: 66 U/L (ref 39–117)
Bilirubin, Direct: 0.1 mg/dL (ref 0.0–0.3)
Indirect Bilirubin: 0.6 mg/dL (ref 0.0–0.9)
Total Bilirubin: 0.7 mg/dL (ref 0.3–1.2)

## 2011-09-07 LAB — CBC WITH DIFFERENTIAL/PLATELET
Basophils Relative: 1 % (ref 0–1)
Eosinophils Absolute: 0.1 10*3/uL (ref 0.0–0.7)
Eosinophils Relative: 2 % (ref 0–5)
Hemoglobin: 11.1 g/dL — ABNORMAL LOW (ref 12.0–15.0)
MCH: 29.5 pg (ref 26.0–34.0)
MCHC: 34.7 g/dL (ref 30.0–36.0)
Monocytes Relative: 9 % (ref 3–12)
Neutrophils Relative %: 65 % (ref 43–77)
Platelets: 239 10*3/uL (ref 150–400)

## 2011-09-07 LAB — BASIC METABOLIC PANEL
BUN: 25 mg/dL — ABNORMAL HIGH (ref 6–23)
Calcium: 9.7 mg/dL (ref 8.4–10.5)
Creat: 1.29 mg/dL — ABNORMAL HIGH (ref 0.50–1.10)
Glucose, Bld: 154 mg/dL — ABNORMAL HIGH (ref 70–99)
Sodium: 142 mEq/L (ref 135–145)

## 2011-09-07 LAB — LIPID PANEL
LDL Cholesterol: 85 mg/dL (ref 0–99)
Triglycerides: 136 mg/dL (ref <150)

## 2011-09-07 LAB — HEMOGLOBIN A1C: Hgb A1c MFr Bld: 7.3 % — ABNORMAL HIGH (ref <5.7)

## 2011-09-07 NOTE — Telephone Encounter (Signed)
The pt presented to the lab for blood work. Orders placed per 07/07/11 office note and given to the lab.

## 2011-09-08 ENCOUNTER — Ambulatory Visit (INDEPENDENT_AMBULATORY_CARE_PROVIDER_SITE_OTHER): Payer: Medicare Other | Admitting: Internal Medicine

## 2011-09-08 ENCOUNTER — Encounter: Payer: Self-pay | Admitting: Internal Medicine

## 2011-09-08 ENCOUNTER — Other Ambulatory Visit: Payer: Self-pay | Admitting: Internal Medicine

## 2011-09-08 VITALS — BP 108/60 | HR 52 | Temp 97.6°F | Resp 20 | Ht 66.0 in | Wt 165.0 lb

## 2011-09-08 DIAGNOSIS — E119 Type 2 diabetes mellitus without complications: Secondary | ICD-10-CM

## 2011-09-08 DIAGNOSIS — I10 Essential (primary) hypertension: Secondary | ICD-10-CM

## 2011-09-08 NOTE — Telephone Encounter (Signed)
Rx refill sent to pharmacy. 

## 2011-09-08 NOTE — Patient Instructions (Signed)
Please schedule lab work prior to your next appointment Chem7, a1c, urine microalbumin-250.0

## 2011-09-10 ENCOUNTER — Telehealth: Payer: Self-pay | Admitting: Internal Medicine

## 2011-09-10 MED ORDER — COLESEVELAM HCL 625 MG PO TABS: 1875.0000 mg | ORAL_TABLET | Freq: Every day | ORAL | Status: DC

## 2011-09-10 NOTE — Telephone Encounter (Signed)
Rx refill sent to pharmacy. 

## 2011-09-10 NOTE — Telephone Encounter (Signed)
Refill- welchol 625mg  tab. Take three tablets (1,875mg  total) by mouth two times daily with a meal. Qty 180 last fill 4.24.12

## 2011-09-19 NOTE — Progress Notes (Signed)
  Subjective:    Patient ID: Destiny [name], female    DOB: 1931-03-17, 78 y.o.   MRN: [id number]  HPI Pt presents to clinic for followup of multiple medical problems. Taking medication on a regular basis now. Reviewed a1c improved to 7.3. avg fsbs 100-125 without hypoglycemia. bp reviewed normotensive.  Past Medical History  Diagnosis Date  . Undiagnosed cardiac murmurs   . PVD (peripheral vascular disease)   . CAD (coronary artery disease)   . Hypertension   . Hypernatremia   . Hyperlipidemia   . Duodenal ulcer     acute hemorrhage  . Hyperkalemia   . Achlorhydria   . Iron deficiency anemia secondary to blood loss (chronic)   . Amaurosis fugax   . Carotid bruit   . Diabetes mellitus type II   . Dementia     pt denies  . Diabetic retinopathy   . Gallstones 2009    seen on CT scan  . Diverticulosis   . Atherosclerosis   . Renal insufficiency   . CVA (cerebral infarction)   . Diabetic peripheral neuropathy   . GI bleed 04/2011    Cause unclear   Past Surgical History  Procedure Date  . Coronary artery bypass graft 1999    x 4  . Abdominal hysterectomy   . Upper gastrointestinal endoscopy 02/22/2008    w/biopsy, duedenal ulcer, antrum erosions  . Esophagogastroduodenoscopy 05/16/2011    Procedure: ESOPHAGOGASTRODUODENOSCOPY (EGD);  Surgeon: Gatha Mayer, MD;  Location: [facility] ENDOSCOPY;  Service: Endoscopy;  Laterality: N/A;  . Colonoscopy 05/20/2011    Procedure: COLONOSCOPY;  Surgeon: Scarlette Shorts, MD;  Location: [place];  Service: Endoscopy;  Laterality: N/A;    reports that she quit smoking about 10 years ago. Her smoking use included Cigarettes. She has never used smokeless tobacco. She reports that she does not drink alcohol or use illicit drugs. family history includes Cancer in her mother; Coronary artery disease in her father; Diabetes in her maternal uncle; Hypertension in her father; and Stroke in her father. No Known Allergies    Review of Systems see hpi    Objective:   Physical Exam  Physical Exam  Nursing note and vitals reviewed. Constitutional: Appears well-developed and well-nourished. No distress.  HENT:  Head: Normocephalic and atraumatic.  Right Ear: External ear normal.  Left Ear: External ear normal.  Eyes: Conjunctivae are normal. No scleral icterus.  Neck: Neck supple. Carotid bruit is not present.  Cardiovascular: Normal rate, regular rhythm and normal heart sounds.  Exam reveals no gallop and no friction rub.   No murmur heard. Pulmonary/Chest: Effort normal and breath sounds normal. No respiratory distress. He has no wheezes. no rales.  Lymphadenopathy:    He has no cervical adenopathy.  Neurological:Alert.  Skin: Skin is warm and dry. Not diaphoretic.  Psychiatric: Has a normal mood and affect.  Diabetic foot exam: +2 DP pulses, no diabetic wounds, ulcerations or significant callousing. Monofilament exam nl.       Assessment & Plan:

## 2011-09-19 NOTE — Assessment & Plan Note (Signed)
Normotensive and stable. Continue current regimen. Monitor bp as outpt and followup in clinic as scheduled.  

## 2011-09-19 NOTE — Assessment & Plan Note (Signed)
Improving control with medication compliance. Continue current regimen

## 2011-10-02 ENCOUNTER — Other Ambulatory Visit: Payer: Self-pay | Admitting: Internal Medicine

## 2011-11-25 ENCOUNTER — Other Ambulatory Visit: Payer: Self-pay | Admitting: Cardiovascular Disease

## 2011-12-01 ENCOUNTER — Other Ambulatory Visit (INDEPENDENT_AMBULATORY_CARE_PROVIDER_SITE_OTHER): Payer: Medicare Other

## 2011-12-01 ENCOUNTER — Telehealth: Payer: Self-pay | Admitting: Internal Medicine

## 2011-12-01 DIAGNOSIS — E119 Type 2 diabetes mellitus without complications: Secondary | ICD-10-CM

## 2011-12-01 LAB — MICROALBUMIN / CREATININE URINE RATIO
Creatinine,U: 148.7 mg/dL
Microalb, Ur: 85.1 mg/dL — ABNORMAL HIGH (ref 0.0–1.9)

## 2011-12-01 LAB — BASIC METABOLIC PANEL
Calcium: 9.1 mg/dL (ref 8.4–10.5)
GFR: 38.91 mL/min — ABNORMAL LOW (ref >60.00)
Glucose, Bld: 138 mg/dL — ABNORMAL HIGH (ref 70–99)
Sodium: 140 mEq/L (ref 135–145)

## 2011-12-01 NOTE — Telephone Encounter (Signed)
Orders entered per last office note, see below:  Please schedule lab work prior to your next appointment  Chem7, a1c, urine microalbumin-250.0

## 2011-12-03 ENCOUNTER — Other Ambulatory Visit: Payer: Self-pay | Admitting: Internal Medicine

## 2011-12-03 NOTE — Telephone Encounter (Signed)
Done/SLS 

## 2011-12-06 ENCOUNTER — Encounter (HOSPITAL_COMMUNITY): Payer: Self-pay | Admitting: Emergency Medicine

## 2011-12-06 ENCOUNTER — Inpatient Hospital Stay (HOSPITAL_COMMUNITY)
Admission: EM | Admit: 2011-12-06 | Discharge: 2011-12-10 | DRG: 378 | Disposition: A | Discharge status: Home / Self Care | Payer: Medicare Other | Attending: Internal Medicine | Admitting: Internal Medicine

## 2011-12-06 DIAGNOSIS — K263 Acute duodenal ulcer without hemorrhage or perforation: Secondary | ICD-10-CM

## 2011-12-06 DIAGNOSIS — I10 Essential (primary) hypertension: Secondary | ICD-10-CM | POA: Diagnosis present

## 2011-12-06 DIAGNOSIS — K922 Gastrointestinal hemorrhage, unspecified: Principal | ICD-10-CM | POA: Diagnosis present

## 2011-12-06 DIAGNOSIS — R1013 Epigastric pain: Secondary | ICD-10-CM

## 2011-12-06 DIAGNOSIS — I739 Peripheral vascular disease, unspecified: Secondary | ICD-10-CM | POA: Diagnosis present

## 2011-12-06 DIAGNOSIS — I6350 Cerebral infarction due to unspecified occlusion or stenosis of unspecified cerebral artery: Secondary | ICD-10-CM | POA: Diagnosis present

## 2011-12-06 DIAGNOSIS — Z794 Long term (current) use of insulin: Secondary | ICD-10-CM

## 2011-12-06 DIAGNOSIS — E1139 Type 2 diabetes mellitus with other diabetic ophthalmic complication: Secondary | ICD-10-CM | POA: Diagnosis present

## 2011-12-06 DIAGNOSIS — I2510 Atherosclerotic heart disease of native coronary artery without angina pectoris: Secondary | ICD-10-CM | POA: Diagnosis present

## 2011-12-06 DIAGNOSIS — E1149 Type 2 diabetes mellitus with other diabetic neurological complication: Secondary | ICD-10-CM | POA: Diagnosis present

## 2011-12-06 DIAGNOSIS — Z79899 Other long term (current) drug therapy: Secondary | ICD-10-CM

## 2011-12-06 DIAGNOSIS — Z7982 Long term (current) use of aspirin: Secondary | ICD-10-CM

## 2011-12-06 DIAGNOSIS — E1142 Type 2 diabetes mellitus with diabetic polyneuropathy: Secondary | ICD-10-CM | POA: Diagnosis present

## 2011-12-06 DIAGNOSIS — Z7902 Long term (current) use of antithrombotics/antiplatelets: Secondary | ICD-10-CM

## 2011-12-06 DIAGNOSIS — F0390 Unspecified dementia without behavioral disturbance: Secondary | ICD-10-CM | POA: Diagnosis present

## 2011-12-06 DIAGNOSIS — E785 Hyperlipidemia, unspecified: Secondary | ICD-10-CM | POA: Diagnosis present

## 2011-12-06 DIAGNOSIS — E119 Type 2 diabetes mellitus without complications: Secondary | ICD-10-CM | POA: Diagnosis present

## 2011-12-06 DIAGNOSIS — D62 Acute posthemorrhagic anemia: Secondary | ICD-10-CM | POA: Diagnosis present

## 2011-12-06 DIAGNOSIS — K921 Melena: Secondary | ICD-10-CM

## 2011-12-06 DIAGNOSIS — D649 Anemia, unspecified: Secondary | ICD-10-CM

## 2011-12-06 DIAGNOSIS — Z951 Presence of aortocoronary bypass graft: Secondary | ICD-10-CM

## 2011-12-06 DIAGNOSIS — E11319 Type 2 diabetes mellitus with unspecified diabetic retinopathy without macular edema: Secondary | ICD-10-CM | POA: Diagnosis present

## 2011-12-06 DIAGNOSIS — Z87891 Personal history of nicotine dependence: Secondary | ICD-10-CM

## 2011-12-06 DIAGNOSIS — Z23 Encounter for immunization: Secondary | ICD-10-CM

## 2011-12-06 DIAGNOSIS — Z8673 Personal history of transient ischemic attack (TIA), and cerebral infarction without residual deficits: Secondary | ICD-10-CM

## 2011-12-06 LAB — CBC WITH DIFFERENTIAL/PLATELET
Eosinophils Absolute: 0.2 10*3/uL (ref 0.0–0.7)
HCT: 27.4 % — ABNORMAL LOW (ref 36.0–46.0)
Hemoglobin: 9.3 g/dL — ABNORMAL LOW (ref 12.0–15.0)
Lymphs Abs: 1.6 10*3/uL (ref 0.7–4.0)
MCH: 29.8 pg (ref 26.0–34.0)
Monocytes Absolute: 0.5 10*3/uL (ref 0.1–1.0)
Monocytes Relative: 6 % (ref 3–12)
Neutro Abs: 7.2 10*3/uL (ref 1.7–7.7)
Neutrophils Relative %: 76 % (ref 43–77)
RBC: 3.12 MIL/uL — ABNORMAL LOW (ref 3.87–5.11)

## 2011-12-06 LAB — GLUCOSE, CAPILLARY: Glucose-Capillary: 137 mg/dL — ABNORMAL HIGH (ref 70–99)

## 2011-12-06 MED ORDER — INFLUENZA VIRUS VACC SPLIT PF IM SUSP
0.5000 mL | INTRAMUSCULAR | Status: AC
  Administered 2011-12-07: 0.5 mL via INTRAMUSCULAR
  Filled 2011-12-06: qty 0.5

## 2011-12-06 MED ORDER — SODIUM CHLORIDE 0.9 % IV SOLN
INTRAVENOUS | Status: DC
  Administered 2011-12-08: 10 mL/h via INTRAVENOUS
  Administered 2011-12-08 (×2): 500 mL via INTRAVENOUS

## 2011-12-06 MED ORDER — SODIUM CHLORIDE 0.9 % IJ SOLN
3.0000 mL | Freq: Two times a day (BID) | INTRAMUSCULAR | Status: DC
  Administered 2011-12-06 – 2011-12-10 (×5): 3 mL via INTRAVENOUS

## 2011-12-06 MED ORDER — ONDANSETRON HCL 4 MG/2ML IJ SOLN
4.0000 mg | Freq: Four times a day (QID) | INTRAMUSCULAR | Status: DC | PRN
  Administered 2011-12-07: 4 mg via INTRAVENOUS
  Filled 2011-12-06: qty 2

## 2011-12-06 MED ORDER — SODIUM CHLORIDE 0.9 % IV SOLN
INTRAVENOUS | Status: DC
  Administered 2011-12-06: 13:00:00 via INTRAVENOUS

## 2011-12-06 MED ORDER — INSULIN GLARGINE 100 UNIT/ML ~~LOC~~ SOLN
40.0000 [IU] | Freq: Every day | SUBCUTANEOUS | Status: DC
  Administered 2011-12-07 – 2011-12-08 (×2): 40 [IU] via SUBCUTANEOUS

## 2011-12-06 MED ORDER — PANTOPRAZOLE SODIUM 40 MG IV SOLR
40.0000 mg | Freq: Two times a day (BID) | INTRAVENOUS | Status: DC
  Administered 2011-12-06 – 2011-12-09 (×5): 40 mg via INTRAVENOUS
  Filled 2011-12-06 (×7): qty 40

## 2011-12-06 MED ORDER — MORPHINE SULFATE 2 MG/ML IJ SOLN: 0.5000 mg | INTRAMUSCULAR | Status: DC | PRN

## 2011-12-06 MED ORDER — SODIUM CHLORIDE 0.9 % IV SOLN
INTRAVENOUS | Status: DC
  Administered 2011-12-07: 23:00:00 via INTRAVENOUS
  Administered 2011-12-09: 10 mL/h via INTRAVENOUS

## 2011-12-06 MED ORDER — SODIUM CHLORIDE 0.9 % IV SOLN: INTRAVENOUS | Status: DC

## 2011-12-06 MED ORDER — ONDANSETRON HCL 4 MG PO TABS: 4.0000 mg | ORAL_TABLET | Freq: Four times a day (QID) | ORAL | Status: DC | PRN

## 2011-12-06 NOTE — ED Notes (Signed)
Pt states she noticed bright red blood in her feces this morning. States her son noticed some blood in her feces about 1 week ago while he was cleaning up after her.

## 2011-12-06 NOTE — ED Notes (Signed)
I let pt know we need a urine sample.10:26am JG.

## 2011-12-06 NOTE — ED Provider Notes (Addendum)
History     CSN: [id number]  Arrival date & time 12/06/11  [id number]   First MD Initiated Contact with Patient 12/06/11 [phone]      Chief Complaint  Patient presents with  . Rectal Bleeding    (Consider location/radiation/quality/duration/timing/severity/associated sxs/prior treatment) HPI Patient had a dark tarry stool posterior one week ago and today noticed bright red blood per rectum. She complains of feeling lightheaded and generalized weakness for prostate one week. Denies chest pain. Admits to epigastric pain a few days ago though none presently. No treatment prior to coming here no other complaint. No treatment prior to coming here Past Medical History  Diagnosis Date  . Undiagnosed cardiac murmurs   . PVD (peripheral vascular disease)   . CAD (coronary artery disease)   . Hypertension   . Hypernatremia   . Hyperlipidemia   . Duodenal ulcer     acute hemorrhage  . Hyperkalemia   . Achlorhydria   . Iron deficiency anemia secondary to blood loss (chronic)   . Amaurosis fugax   . Carotid bruit   . Diabetes mellitus type II   . Dementia     pt denies  . Diabetic retinopathy   . Gallstones 2009    seen on CT scan  . Diverticulosis   . Atherosclerosis   . Renal insufficiency   . CVA (cerebral infarction)   . Diabetic peripheral neuropathy   . GI bleed 04/2011    Cause unclear    Past Surgical History  Procedure Date  . Coronary artery bypass graft 1999    x 4  . Abdominal hysterectomy   . Upper gastrointestinal endoscopy 02/22/2008    w/biopsy, duedenal ulcer, antrum erosions  . Esophagogastroduodenoscopy 05/16/2011    Procedure: ESOPHAGOGASTRODUODENOSCOPY (EGD);  Surgeon: Gatha Mayer, MD;  Location: [facility] ENDOSCOPY;  Service: Endoscopy;  Laterality: N/A;  . Colonoscopy 05/20/2011    Procedure: COLONOSCOPY;  Surgeon: Scarlette Shorts, MD;  Location: [place];  Service: Endoscopy;  Laterality: N/A;    Family History  Problem Relation Age of Onset  . Coronary artery  disease Father   . Hypertension Father   . Stroke Father   . Cancer Mother     ?  . Diabetes Maternal Uncle     History  Substance Use Topics  . Smoking status: Former Smoker    Types: Cigarettes    Quit date: 12/31/2000  . Smokeless tobacco: Never Used  . Alcohol Use: No    OB History    Grav Para Term Preterm Abortions TAB SAB Ect Mult Living                  Review of Systems  HENT: Negative.   Respiratory: Negative.   Cardiovascular: Negative.   Gastrointestinal: Positive for abdominal pain and blood in stool.  Musculoskeletal:        Left shoulder pain for 3 weeks  Skin: Negative.   Neurological: Positive for weakness.  Hematological: Negative.   Psychiatric/Behavioral: Negative.   All other systems reviewed and are negative.    Allergies  Review of patient's allergies indicates no known allergies.  Home Medications   Current Outpatient Rx  Name Route Sig Dispense Refill  . AMLODIPINE BESYLATE 10 MG PO TABS Oral Take 10 mg by mouth daily.    . ASPIRIN EC 81 MG PO TBEC Oral Take 81 mg by mouth daily.    Marland Kitchen VITAMIN D 1000 UNITS PO CAPS Oral Take 1,000 Units by mouth daily.     Marland Kitchen  COLESEVELAM HCL 625 MG PO TABS Oral Take 3 tablets (1,875 mg total) by mouth daily. 180 tablet 1  . DONEPEZIL HCL 10 MG PO TABS Oral Take 1 tablet (10 mg total) by mouth daily. 30 tablet 6  . IRON 325 (65 FE) MG PO TABS Oral Take 325 mg by mouth 2 (two) times daily.     . FUROSEMIDE 20 MG PO TABS Oral Take 20 mg by mouth 2 (two) times daily.    Marland Kitchen GLUCOSAMINE CHONDR 500 COMPLEX PO CAPS Oral Take 1 capsule by mouth daily.     . ISOSORBIDE MONONITRATE ER 30 MG PO TB24 Oral Take 30 mg by mouth daily.    Marland Kitchen MEMANTINE HCL 10 MG PO TABS Oral Take 1 tablet (10 mg total) by mouth 2 (two) times daily. 60 tablet 6  . METOPROLOL SUCCINATE ER 25 MG PO TB24 Oral Take 25 mg by mouth daily.    . CENTRUM SILVER PO Oral Take 1 tablet by mouth daily.     Marland Kitchen NITROGLYCERIN 0.4 MG SL SUBL Sublingual Place  0.4 mg under the tongue every 5 (five) minutes as needed. Chest pain    . FISH OIL 1200 MG PO CAPS Oral Take 1 capsule by mouth daily.    Marland Kitchen PANTOPRAZOLE SODIUM 40 MG PO TBEC Oral Take 40 mg by mouth daily.    . QUINAPRIL HCL 40 MG PO TABS Oral Take 40 mg by mouth at bedtime.    Marland Kitchen ROSUVASTATIN CALCIUM 20 MG PO TABS Oral Take 20 mg by mouth daily.    Marland Kitchen VITAMIN B-12 1000 MCG PO TABS Oral Take 1,000 mcg by mouth daily.    Marland Kitchen VITAMIN E 400 UNITS PO CAPS Oral Take 400 Units by mouth daily.    Marland Kitchen CLOPIDOGREL BISULFATE 75 MG PO TABS  TAKE 1 TABLET (75 MG TOTAL) BY MOUTH DAILY. 30 tablet 1  . VITAMIN B-12 2500 MCG SL SUBL Sublingual Place 2,500 mcg under the tongue daily.     Marland Kitchen HUMALOG KWIKPEN 100 UNIT/ML [place] SOLN  INJECT 5-10 UNITS INTO THE SKIN 3 (THREE) TIMES DAILY BEFORE MEALS. 15 mL 3  . INSULIN GLARGINE 100 UNIT/ML [place] SOLN Subcutaneous Inject 30 Units into the skin at bedtime. 10 mL 6    Please dispense quantity sufficient for 30 day sup ...  . INSULIN LISPRO (HUMAN) 100 UNIT/ML [place] SOLN Subcutaneous Inject 5-15 Units into the skin 3 (three) times daily before meals. 10 mL 6    Please dispense quantity sufficient for 30 day sup ...  . LANTUS SOLOSTAR 100 UNIT/ML [place] SOLN  INJECT 25-30 UNITS INTO THE SKIN DAILY. 15 mL 3    Temp 97.7 F (36.5 C) (Axillary)  Resp 16  SpO2 100%  Physical Exam  Nursing note and vitals reviewed. Constitutional: She appears well-developed and well-nourished.  HENT:  Head: Normocephalic and atraumatic.  Eyes: Conjunctivae normal are normal. Pupils are equal, round, and reactive to light.  Neck: Neck supple. No tracheal deviation present. No thyromegaly present.  Cardiovascular: Normal rate and regular rhythm.   No murmur heard. Pulmonary/Chest: Effort normal and breath sounds normal.  Abdominal: Soft. Bowel sounds are normal. She exhibits no distension. There is no tenderness.  Genitourinary: Guaiac positive stool.       Maroon stool, grossly heme positive    Musculoskeletal: Normal range of motion. She exhibits no edema and no tenderness.  Neurological: She is alert. Coordination normal.  Skin: Skin is warm and dry. No rash noted.  Psychiatric:  She has a normal mood and affect.    ED Course  Procedures (including critical care time)   Labs Reviewed  URINALYSIS, ROUTINE W REFLEX MICROSCOPIC  CBC WITH DIFFERENTIAL   i-STAT 8 remarkable for glucose 237 hemoglobin 8.8 otherwise normal No results found.   No diagnosis found.   Date: 12/06/2011  Rate: 55  Rhythm: sinus bradycardia  QRS Axis: normal  Intervals: normal  ST/T Wave abnormalities: nonspecific T wave changes  Conduction Disutrbances:none  Narrative Interpretation:   Old EKG Reviewed: Tracing from 05/14/2011 showed normal sinus rhythm 65 beats per minute otherwise no significant change as interpreted by me  Results for orders placed during the hospital encounter of 12/06/11  CBC WITH DIFFERENTIAL      Component Value Range   WBC 9.5  4.0 - 10.5 K/uL   RBC 3.12 (*) 3.87 - 5.11 MIL/uL   Hemoglobin 9.3 (*) 12.0 - 15.0 g/dL   HCT 27.4 (*) 36.0 - 46.0 %   MCV 87.8  78.0 - 100.0 fL   MCH 29.8  26.0 - 34.0 pg   MCHC 33.9  30.0 - 36.0 g/dL   RDW 14.1  11.5 - 15.5 %   Platelets 224  150 - 400 K/uL   Neutrophils Relative 76  43 - 77 %   Neutro Abs 7.2  1.7 - 7.7 K/uL   Lymphocytes Relative 16  12 - 46 %   Lymphs Abs 1.6  0.7 - 4.0 K/uL   Monocytes Relative 6  3 - 12 %   Monocytes Absolute 0.5  0.1 - 1.0 K/uL   Eosinophils Relative 2  0 - 5 %   Eosinophils Absolute 0.2  0.0 - 0.7 K/uL   Basophils Relative 0  0 - 1 %   Basophils Absolute 0.0  0.0 - 0.1 K/uL  TYPE AND SCREEN      Component Value Range   ABO/RH(D) A POS     Antibody Screen NEG     Sample Expiration 12/09/2011    OCCULT BLOOD, POC DEVICE      Component Value Range   Fecal Occult Bld POSITIVE     No results found.  MDM  Spoke with internal medicine  resident physician and admit step down unit. Patient  suffering from mild symptomatic anemia . Type and screenordered .Suggest GI consult. Troponin obtained due to shoulder pain for 3 weeks. Doubt anginal: Patient denies chest pain Diagnosis #1 acute GI bleed #2 anemia #3 hyperglycemia  CRITICAL CARE Performed by: Orlie Dakin   Total critical care time: 30 minute  Critical care time was exclusive of separately billable procedures and treating other patients.  Critical care was necessary to treat or prevent imminent or life-threatening deterioration.  Critical care was time spent personally by me on the following activities: development of treatment plan with patient and/or surrogate as well as nursing, discussions with consultants, evaluation of patient's response to treatment, examination of patient, obtaining history from patient or surrogate, ordering and performing treatments and interventions, ordering and review of laboratory studies, ordering and review of radiographic studies, pulse oximetry and re-evaluation of patient's condition.      Orlie Dakin, MD 12/06/11 [place], MD 12/06/11 1236  Addendum patient seeslEBUAER [place] primary care physician. hospitalist M.D. called. Requests telemetry bed, ordered by me  Orlie Dakin, MD 12/06/11 1308

## 2011-12-06 NOTE — Progress Notes (Signed)
Called for disposition the patient--- 78 year old with melanotic stool one week ago. Did not seek medical care at that time. This morning, the patient had bright red blood with her stool this morning which prompted the visit to the emergency department. Patient also had maroon stool she was in the emergency department. Vitals remained stable. No tachyarrhythmias. Blood pressure has been borderline hypertensive. Patient complains of some dizziness and general weakness x1 week.  Shoulder pain x3 weeks. Hemoglobin was 11.1 months ago. Today it is 9.3. Denies chest pain or shortness of breath. Given instructions to admit patient to telemetry. Patient has history of coronary artery disease, hypertension, duodenal ulcer. Sees [place] primary care.

## 2011-12-06 NOTE — H&P (Signed)
[facility]          History and Physical    PCP:   Gay Filler, MD   Chief Complaint:  BRBPR   HPI: Very pleasant 78 y/o white woman with h/o PUD presents to the hospital today after having 3 episodes of hematochezia this morning. She states that she has been having epigastric pain for 2 weeks and 1 week ago had a melanotic stool (she did not seek medical attention at that time). Back in Feb, she was admitted with a GI Bleed, EGD and colonoscopy were done and unremarkable for source of bleeding. Patient was found to have diverticulosis. It appears she followed up with Dr. Carlean Purl in the office who did a capsule endoscopy, which was normal per patient report. She was told to return to the hospital is she ever had bleeding, which is why she came today. Hb is 9.3 down from 11 1 month ago. She was found to have maroon-colored stool on guaiac. We have been asked to admit her for further evaluation and management.  Allergies:  No Known Allergies    Past Medical History  Diagnosis Date  . Undiagnosed cardiac murmurs   . PVD (peripheral vascular disease)   . CAD (coronary artery disease)   . Hypertension   . Hypernatremia   . Hyperlipidemia   . Duodenal ulcer     acute hemorrhage  . Hyperkalemia   . Achlorhydria   . Iron deficiency anemia secondary to blood loss (chronic)   . Amaurosis fugax   . Carotid bruit   . Diabetes mellitus type II   . Dementia     pt denies  . Diabetic retinopathy   . Gallstones 2009    seen on CT scan  . Diverticulosis   . Atherosclerosis   . Renal insufficiency   . CVA (cerebral infarction)   . Diabetic peripheral neuropathy   . GI bleed 04/2011    Cause unclear    Past Surgical History  Procedure Date  . Coronary artery bypass graft 1999    x 4  . Abdominal hysterectomy   . Upper gastrointestinal endoscopy 02/22/2008    w/biopsy, duedenal ulcer, antrum erosions  . Esophagogastroduodenoscopy 05/16/2011   Procedure: ESOPHAGOGASTRODUODENOSCOPY (EGD);  Surgeon: Gatha Mayer, MD;  Location: [facility] ENDOSCOPY;  Service: Endoscopy;  Laterality: N/A;  . Colonoscopy 05/20/2011    Procedure: COLONOSCOPY;  Surgeon: Scarlette Shorts, MD;  Location: [employer] [facility];  Service: Endoscopy;  Laterality: N/A;    Prior to Admission medications   Medication Sig Start Date End Date Taking? Authorizing Provider  amLODipine (NORVASC) 10 MG tablet Take 10 mg by mouth daily.   Yes Historical Provider, MD  aspirin EC 81 MG tablet Take 81 mg by mouth daily.   Yes Historical Provider, MD  Cholecalciferol (VITAMIN D) 1000 UNITS capsule Take 1,000 Units by mouth daily.    Yes Historical Provider, MD  clopidogrel (PLAVIX) 75 MG tablet Take 75 mg by mouth daily.   Yes Historical Provider, MD  colesevelam (WELCHOL) 625 MG tablet Take 1,875 mg by mouth daily. 09/10/11  Yes Burnice Logan, MD  donepezil (ARICEPT) 10 MG tablet Take 10 mg by mouth daily. 07/07/11 07/06/12 Yes Burnice Logan, MD  Ferrous Sulfate (IRON) 325 (65 FE) MG TABS Take 325 mg by mouth 2 (two) times daily.    Yes Historical Provider, MD  furosemide (LASIX) 20 MG tablet Take 20 mg by mouth 2 (two) times daily.   Yes Historical Provider,  MD  Glucosamine-Chondroit-Vit C-Mn (GLUCOSAMINE CHONDR 500 COMPLEX) CAPS Take 1 capsule by mouth daily.    Yes Historical Provider, MD  insulin glargine (LANTUS) 100 UNIT/ML injection Inject 40 Units into the skin at bedtime. 07/15/11 07/14/12 Yes Burnice Logan, MD  insulin lispro (HUMALOG) 100 UNIT/ML injection Inject 5-15 Units into the skin 3 (three) times daily before meals. 07/15/11  Yes Burnice Logan, MD  isosorbide mononitrate (IMDUR) 30 MG 24 hr tablet Take 30 mg by mouth daily.   Yes Historical Provider, MD  memantine (NAMENDA) 10 MG tablet Take 10 mg by mouth 2 (two) times daily. 07/07/11  Yes Burnice Logan, MD  metoprolol succinate (TOPROL-XL) 25 MG 24 hr tablet Take 25 mg by mouth daily. 07/23/11  Yes Burnice Logan, MD  Multiple  Vitamins-Minerals (CENTRUM SILVER PO) Take 1 tablet by mouth daily.    Yes Historical Provider, MD  nitroGLYCERIN (NITROSTAT) 0.4 MG SL tablet Place 0.4 mg under the tongue every 5 (five) minutes as needed. Chest pain   Yes Historical Provider, MD  Omega-3 Fatty Acids (FISH OIL) 1200 MG CAPS Take 1 capsule by mouth daily.   Yes Historical Provider, MD  pantoprazole (PROTONIX) 40 MG tablet Take 40 mg by mouth daily.   Yes Historical Provider, MD  quinapril (ACCUPRIL) 40 MG tablet Take 40 mg by mouth at bedtime.   Yes Historical Provider, MD  rosuvastatin (CRESTOR) 20 MG tablet Take 20 mg by mouth daily.   Yes Historical Provider, MD  vitamin B-12 (CYANOCOBALAMIN) 1000 MCG tablet Take 1,000 mcg by mouth daily.   Yes Historical Provider, MD  vitamin E 400 UNIT capsule Take 400 Units by mouth daily.   Yes Historical Provider, MD    Social History:  reports that she quit smoking about 10 years ago. Her smoking use included Cigarettes. She has never used smokeless tobacco. She reports that she does not drink alcohol or use illicit drugs.  Family History  Problem Relation Age of Onset  . Coronary artery disease Father   . Hypertension Father   . Stroke Father   . Cancer Mother     ?  . Diabetes Maternal Uncle     Review of Systems:  Constitutional: Denies fever, chills, diaphoresis, appetite change and fatigue.  HEENT: Denies photophobia, eye pain, redness, hearing loss, ear pain, congestion, sore throat, rhinorrhea, sneezing, mouth sores, trouble swallowing, neck pain, neck stiffness and tinnitus.   Respiratory: Denies SOB, DOE, cough, chest tightness,  and wheezing.   Cardiovascular: Denies chest pain, palpitations and leg swelling.  Gastrointestinal: Denies nausea, vomiting,  diarrhea, constipation,  and abdominal distention.  Genitourinary: Denies dysuria, urgency, frequency, hematuria, flank pain and difficulty urinating.  Musculoskeletal: Denies myalgias, back pain, joint swelling,  arthralgias and gait problem.  Skin: Denies pallor, rash and wound.  Neurological: Denies dizziness, seizures, syncope, weakness, light-headedness, numbness and headaches.  Hematological: Denies adenopathy. Easy bruising, personal or family bleeding history  Psychiatric/Behavioral: Denies suicidal ideation, mood changes, confusion, nervousness, sleep disturbance and agitation   Physical Exam: Blood pressure 137/49, pulse 58, temperature 97.9 F (36.6 C), temperature source Oral, resp. rate 18, height 5\' 6"  (1.676 m), weight 72.984 kg (160 lb 14.4 oz), SpO2 100.00%. Gen: AAOx3, NAD HEENT: [place]/AT/PERRL/EOMI Neck: supple, no JVD, no LAD, no bruits, no goiter. CV: RRR, +SEM Lungs: CTA B Abdomen: S/NT/ND/+BS/no masses or organomegaly noted. Ext: no C/C/E, +pedal pulses Neuro: grossly intact and nonfocal, I have not ambulated her. Skin: No rashes, wounds, skin breakdown seen  on exam.   Labs on Admission:  Results for orders placed during the hospital encounter of 12/06/11 (from the past 48 hour(s))  CBC WITH DIFFERENTIAL     Status: Abnormal   Collection Time   12/06/11  9:54 AM      Component Value Range Comment   WBC 9.5  4.0 - 10.5 K/uL    RBC 3.12 (*) 3.87 - 5.11 MIL/uL    Hemoglobin 9.3 (*) 12.0 - 15.0 g/dL    HCT 27.4 (*) 36.0 - 46.0 %    MCV 87.8  78.0 - 100.0 fL    MCH 29.8  26.0 - 34.0 pg    MCHC 33.9  30.0 - 36.0 g/dL    RDW 14.1  11.5 - 15.5 %    Platelets 224  150 - 400 K/uL    Neutrophils Relative 76  43 - 77 %    Neutro Abs 7.2  1.7 - 7.7 K/uL    Lymphocytes Relative 16  12 - 46 %    Lymphs Abs 1.6  0.7 - 4.0 K/uL    Monocytes Relative 6  3 - 12 %    Monocytes Absolute 0.5  0.1 - 1.0 K/uL    Eosinophils Relative 2  0 - 5 %    Eosinophils Absolute 0.2  0.0 - 0.7 K/uL    Basophils Relative 0  0 - 1 %    Basophils Absolute 0.0  0.0 - 0.1 K/uL   TYPE AND SCREEN     Status: Normal   Collection Time   12/06/11 10:28 AM      Component Value Range Comment   ABO/RH(D) A  POS      Antibody Screen NEG      Sample Expiration 12/09/2011     OCCULT BLOOD, POC DEVICE     Status: Normal   Collection Time   12/06/11 10:31 AM      Component Value Range Comment   Fecal Occult Bld POSITIVE     TROPONIN I     Status: Normal   Collection Time   12/06/11 12:19 PM      Component Value Range Comment   Troponin I <0.30  <0.30 ng/mL   GLUCOSE, CAPILLARY     Status: Abnormal   Collection Time   12/06/11  2:38 PM      Component Value Range Comment   Glucose-Capillary 137 (*) 70 - 99 mg/dL     Radiological Exams on Admission: No results found.  Assessment/Plan Principal Problem:  *Hematochezia Active Problems:  HYPERTENSION  CVA  Hyperlipidemia  Epigastric pain  Diabetes mellitus   Hematochezia -Will hold ASA/Plavix. -Has a h/o bleeding PUD and has been having epigastric pain and melena for 1 week prior to hematochezia today. -Will place on IV BID PPI. -EGD/colonosocpy/capsule endo neg as of Feb/March 2013. -Have asked GI, Dr. Benson Norway on call for Dr. Carlean Purl, to see. -Etiology is likely to be diverticular as she would have to have a massive bleed from PUD to present as hematochezia and she does not seem very unstable at this point. -IVF have been started. -CBC q 8 hrs for now. -2 units of PRBCs have been placed on hold, no transfusion indicated yet. -Will allow clears and NPO after midnight in case GI planning on performing any procedures.  H/o CVA -Plavix on hold.  Hyperlipidemia -Hold statin given NPO state.  HTN -Hold BP meds given NPO state and active GI Bleed.  IDDM -Continue lantus. -Place on SSI. -Check  A1C  DVT Prophylaxis -SCDs.  Time Spent on Admission: 70 minutes  HERNANDEZ ACOSTA,ESTELA [facility] Pager: [phone] 12/06/2011, 3:27 PM

## 2011-12-06 NOTE — Consult Note (Signed)
GI Consult for [facility]  Reason for Consult:GI bleed Referring Physician: [facility]  [name] HPI: This is a 78 year old female with a PMH of GI bleed who is admitted for hematochezia/melena.  She reports that she had a bowel movement and it was bloody.  She took a shower and continued to have bleeding, which prompted her to present to the ER for further evaluation.  Her daughter reports that she had melena initially and last week the patient reported melena.  She had a vague epigastric pain, but no nausea or vomiting.  In 2009 Dr. Carlean Purl performed an EGD for melena and he identified a duodenal bulb ulcer secondary to NSAIDs.  In 04/2011 she had melena and Dr. Carlean Purl again performed an EGD/Colonoscopy/Capsule endoscopy with findings of pandiverticulosis.  No evidence of any ulcers, inflammation, vascular abnormalities, or acute bleeding sites.  As a result of her symptoms a GI consultation was requested.  Past Medical History  Diagnosis Date  . Undiagnosed cardiac murmurs   . PVD (peripheral vascular disease)   . CAD (coronary artery disease)   . Hypertension   . Hypernatremia   . Hyperlipidemia   . Duodenal ulcer     acute hemorrhage  . Hyperkalemia   . Achlorhydria   . Iron deficiency anemia secondary to blood loss (chronic)   . Amaurosis fugax   . Carotid bruit   . Diabetes mellitus type II   . Dementia     pt denies  . Diabetic retinopathy   . Gallstones 2009    seen on CT scan  . Diverticulosis   . Atherosclerosis   . Renal insufficiency   . CVA (cerebral infarction)   . Diabetic peripheral neuropathy   . GI bleed 04/2011    Cause unclear    Past Surgical History  Procedure Date  . Coronary artery bypass graft 1999    x 4  . Abdominal hysterectomy   . Upper gastrointestinal endoscopy 02/22/2008    w/biopsy, duedenal ulcer, antrum erosions  . Esophagogastroduodenoscopy 05/16/2011    Procedure: ESOPHAGOGASTRODUODENOSCOPY (EGD);  Surgeon: Gatha Mayer,  MD;  Location: [facility] ENDOSCOPY;  Service: Endoscopy;  Laterality: N/A;  . Colonoscopy 05/20/2011    Procedure: COLONOSCOPY;  Surgeon: Scarlette Shorts, MD;  Location: [place];  Service: Endoscopy;  Laterality: N/A;    Family History  Problem Relation Age of Onset  . Coronary artery disease Father   . Hypertension Father   . Stroke Father   . Cancer Mother     ?  . Diabetes Maternal Uncle     Social History:  reports that she quit smoking about 10 years ago. Her smoking use included Cigarettes. She has never used smokeless tobacco. She reports that she does not drink alcohol or use illicit drugs.  Allergies: No Known Allergies  Medications:  Scheduled:   . influenza  inactive virus vaccine  0.5 mL Intramuscular Tomorrow-1000  . insulin glargine  40 Units Subcutaneous QHS  . pantoprazole (PROTONIX) IV  40 mg Intravenous Q12H  . sodium chloride  3 mL Intravenous Q12H  . DISCONTD: sodium chloride   Intravenous STAT   Continuous:   . sodium chloride    . DISCONTD: sodium chloride 100 mL/hr at 12/06/11 1321    Results for orders placed during the hospital encounter of 12/06/11 (from the past 24 hour(s))  CBC WITH DIFFERENTIAL     Status: Abnormal   Collection Time   12/06/11  9:54 AM  Component Value Range   WBC 9.5  4.0 - 10.5 K/uL   RBC 3.12 (*) 3.87 - 5.11 MIL/uL   Hemoglobin 9.3 (*) 12.0 - 15.0 g/dL   HCT 27.4 (*) 36.0 - 46.0 %   MCV 87.8  78.0 - 100.0 fL   MCH 29.8  26.0 - 34.0 pg   MCHC 33.9  30.0 - 36.0 g/dL   RDW 14.1  11.5 - 15.5 %   Platelets 224  150 - 400 K/uL   Neutrophils Relative 76  43 - 77 %   Neutro Abs 7.2  1.7 - 7.7 K/uL   Lymphocytes Relative 16  12 - 46 %   Lymphs Abs 1.6  0.7 - 4.0 K/uL   Monocytes Relative 6  3 - 12 %   Monocytes Absolute 0.5  0.1 - 1.0 K/uL   Eosinophils Relative 2  0 - 5 %   Eosinophils Absolute 0.2  0.0 - 0.7 K/uL   Basophils Relative 0  0 - 1 %   Basophils Absolute 0.0  0.0 - 0.1 K/uL  TYPE AND SCREEN     Status: Normal  (Preliminary result)   Collection Time   12/06/11 10:28 AM      Component Value Range   ABO/RH(D) A POS     Antibody Screen NEG     Sample Expiration 12/09/2011     Unit Number [id number]     Blood Component Type RED CELLS,LR     Unit division 00     Status of Unit ALLOCATED     Transfusion Status OK TO TRANSFUSE     Crossmatch Result Compatible     Unit Number [id number]     Blood Component Type RED CELLS,LR     Unit division 00     Status of Unit ALLOCATED     Transfusion Status OK TO TRANSFUSE     Crossmatch Result Compatible    OCCULT BLOOD, POC DEVICE     Status: Normal   Collection Time   12/06/11 10:31 AM      Component Value Range   Fecal Occult Bld POSITIVE    TROPONIN I     Status: Normal   Collection Time   12/06/11 12:19 PM      Component Value Range   Troponin I <0.30  <0.30 ng/mL  GLUCOSE, CAPILLARY     Status: Abnormal   Collection Time   12/06/11  2:38 PM      Component Value Range   Glucose-Capillary 137 (*) 70 - 99 mg/dL  PREPARE RBC (CROSSMATCH)     Status: Normal   Collection Time   12/06/11  3:05 PM      Component Value Range   Order Confirmation ORDER PROCESSED BY BLOOD BANK       No results found.  ROS:  As stated above in the HPI otherwise negative.  Blood pressure 137/49, pulse 58, temperature 97.9 F (36.6 C), temperature source Oral, resp. rate 18, height 5\' 6"  (1.676 m), weight 72.984 kg (160 lb 14.4 oz), SpO2 100.00%.    PE: Gen: NAD, Alert and Oriented HEENT:  [place]/AT, EOMI Neck: Supple, no LAD Lungs: CTA Bilaterally CV: RRR without M/G/R ABM: Soft, NTND, +BS Ext: No C/C/E Rectal: Maroon colored stool  Assessment/Plan: 1) Hematochezia. 2) ? Melena. 3) Epigastric pain. 4) History of PUD.   The bleeding history of late is somewhat confusing as she had melena last week and now she is having hematochezia.  But, her daughter states that  she had melena today before the onset of her hematochezia.  She has pandiverticulosis and if  it was solely a hematochezia presentation the clinical presentation would be consistent with a diverticular bleed.  However, with the epigastric pain, melena report, and prior history of PUD, further evaluation with an EGD is warranted at this time.  Plan: 1) EGD tomorrow AM. 2) Transfuse as necessary. 3) Continue with supportive care.  Ronel Rodeheaver D 12/06/2011, 5:10 PM

## 2011-12-06 NOTE — ED Notes (Signed)
Blood in stool. Son stated, "blood in stool last week but she did not see it." much blood in toilet this morning. Hasn't been felling well.

## 2011-12-07 ENCOUNTER — Ambulatory Visit: Payer: Medicare Other | Admitting: Internal Medicine

## 2011-12-07 DIAGNOSIS — I10 Essential (primary) hypertension: Secondary | ICD-10-CM

## 2011-12-07 DIAGNOSIS — D62 Acute posthemorrhagic anemia: Secondary | ICD-10-CM

## 2011-12-07 DIAGNOSIS — I2510 Atherosclerotic heart disease of native coronary artery without angina pectoris: Secondary | ICD-10-CM

## 2011-12-07 LAB — CBC
HCT: 26.7 % — ABNORMAL LOW (ref 36.0–46.0)
Hemoglobin: 7.8 g/dL — ABNORMAL LOW (ref 12.0–15.0)
Hemoglobin: 8.9 g/dL — ABNORMAL LOW (ref 12.0–15.0)
MCH: 30.6 pg (ref 26.0–34.0)
MCHC: 33.3 g/dL (ref 30.0–36.0)
RBC: 2.55 MIL/uL — ABNORMAL LOW (ref 3.87–5.11)
RBC: 3.03 MIL/uL — ABNORMAL LOW (ref 3.87–5.11)
WBC: 9.3 10*3/uL (ref 4.0–10.5)

## 2011-12-07 LAB — GLUCOSE, CAPILLARY
Glucose-Capillary: 180 mg/dL — ABNORMAL HIGH (ref 70–99)
Glucose-Capillary: 289 mg/dL — ABNORMAL HIGH (ref 70–99)
Glucose-Capillary: 164 mg/dL — ABNORMAL HIGH (ref 70–99)

## 2011-12-07 LAB — BASIC METABOLIC PANEL
BUN: 46 mg/dL — ABNORMAL HIGH (ref 6–23)
Chloride: 111 mEq/L (ref 96–112)
Creatinine, Ser: 1.05 mg/dL (ref 0.50–1.10)
GFR calc Af Amer: 57 mL/min — ABNORMAL LOW (ref >90)
GFR calc non Af Amer: 50 mL/min — ABNORMAL LOW (ref >90)

## 2011-12-07 MED ORDER — FUROSEMIDE 10 MG/ML IJ SOLN
40.0000 mg | Freq: Once | INTRAMUSCULAR | Status: AC
  Administered 2011-12-07: 40 mg via INTRAVENOUS
  Filled 2011-12-07: qty 4

## 2011-12-07 MED ORDER — INSULIN ASPART 100 UNIT/ML ~~LOC~~ SOLN
0.0000 [IU] | Freq: Three times a day (TID) | SUBCUTANEOUS | Status: DC
  Administered 2011-12-07: 2 [IU] via SUBCUTANEOUS
  Administered 2011-12-08: 3 [IU] via SUBCUTANEOUS
  Administered 2011-12-09: 1 [IU] via SUBCUTANEOUS
  Administered 2011-12-09 – 2011-12-10 (×3): 2 [IU] via SUBCUTANEOUS

## 2011-12-07 NOTE — Progress Notes (Addendum)
[facility] PROGRESS NOTE  Destiny [name] [id number] DOB: 1932/02/12 DOA: 12/06/2011 PCP: Jeb Levering, Philbert Riser, MD  Assessment/Plan: Roxanne Gates -2/2 to GIB -Workup and tx as mentioned below  Hematochezia  -Will continue holding ASA/Plavix.  -EGD today; will follow results -Continue on IV BID PPI.  -Will transfuse 2 units of PRBC's and continue following Hgb trend.  H/o CVA  -Plavix on hold.   Hyperlipidemia  -Hold statin given NPO state.   HTN  -Continue holding BP meds due tosettings of acute bleeding.   IDDM  -Continue lantus and SSI.  -A1C 7.6  DVT Prophylaxis  -SCDs.   Code Status: Full Family Communication: no family at bedside Disposition Plan: home when medically stable   Brief narrative: 78 y/o with hx of PUD, diverticulosis and also CAD; admitted secondary to hematochezia and melena. Hgb down from 11.1 to 7.8   Consultants:  GI (Dr. Benson Norway)  Procedures:  EGD schedule for this morning  Antibiotics:  none  HPI/Subjective: Patient with some maroon stool overnight; no hematemesis. Complaining of nausea early in am (now resolved). Hgb down to 7.8; afebrile and otherwise hemodynamically stable. EGD to be done tomorrow.  Objective: Filed Vitals:   12/06/11 2118 12/07/11 0209 12/07/11 0602 12/07/11 0822  BP: 155/52 149/43 148/45 126/40  Pulse:  52 59 59  Temp: 98.3 F (36.8 C) 98.5 F (36.9 C) 99.1 F (37.3 C) 98.6 F (37 C)  TempSrc: Oral Oral Oral Oral  Resp: 16 16 18 16   Height:      Weight:   73.074 kg (161 lb 1.6 oz)   SpO2: 100% 99% 97% 97%    Intake/Output Summary (Last 24 hours) at 12/07/11 0916 Last data filed at 12/07/11 0900  Gross per 24 hour  Intake    840 ml  Output   1475 ml  Net   -635 ml   Filed Weights   12/06/11 1439 12/07/11 0602  Weight: 72.984 kg (160 lb 14.4 oz) 73.074 kg (161 lb 1.6 oz)    Exam:   General:  NAD; denies SOB or CP; afebrile  Cardiovascular: S1 and S2; no rubs or  gallops  Respiratory: CTA bilaterally  Abdomen: soft, NT, ND; positive BS  Extremities: no edema or cyanosis  Neuro: non focal deficit  Data Reviewed: Basic Metabolic Panel:  Lab 123XX123 1204  NA 140  K 4.7  CL 104  CO2 21  GLUCOSE 138*  BUN 28*  CREATININE 1.4*  CALCIUM 9.1  MG --  PHOS --   CBC:  Lab 12/07/11 0050 12/06/11 0954  WBC 8.4 9.5  NEUTROABS -- 7.2  HGB 7.8* 9.3*  HCT 22.2* 27.4*  MCV 87.1 87.8  PLT 175 224   Cardiac Enzymes:  Lab 12/06/11 1219  CKTOTAL --  CKMB --  CKMBINDEX --  TROPONINI <0.30   CBG:  Lab 12/07/11 0612 12/06/11 2316 12/06/11 2101 12/06/11 1438  GLUCAP 145* 113* 180* 137*    Scheduled Meds:   . furosemide  40 mg Intravenous Once  . influenza  inactive virus vaccine  0.5 mL Intramuscular Tomorrow-1000  . insulin glargine  40 Units Subcutaneous QHS  . pantoprazole (PROTONIX) IV  40 mg Intravenous Q12H  . sodium chloride  3 mL Intravenous Q12H  . DISCONTD: sodium chloride   Intravenous STAT   Continuous Infusions:   . sodium chloride    . sodium chloride    . DISCONTD: sodium chloride 100 mL/hr at 12/06/11 1321     Time spent: > 30  minutes  Addendum Patient with brief vagal syncope episode after using bedside commode; appropriate and hemodynamically stable after incident. Will watch closely and transfer to SDU if any changes observed. Will transfuse 2 units of PRBC's.   [facility] Pager [phone]. If 8PM-8AM, please contact night-coverage at [email or web address], password [facility] 12/07/2011, 9:16 AM  LOS: 1 day

## 2011-12-07 NOTE — Progress Notes (Signed)
Pt more awake and verbalizing more.  Rapid Response at the beside. SBP 130's .  Will continue to monitor.  Karie Kirks, Therapist, sports.

## 2011-12-07 NOTE — Consult Note (Addendum)
     [facility] Daily Rounding Note 12/07/2011, 11:51 AM  SUBJECTIVE:       Had some nausea, no emesis this AM.  While on commode passing dark, bloody stools, had vasovagal event with weakness but no LOC.  BP went to 126/40, pulse maintained rates in 50's per usual.    OBJECTIVE:         Vital signs in last 24 hours:    Temp:  [97.9 F (36.6 C)-99.1 F (37.3 C)] 98.6 F (37 C) (09/23 [id number]) Pulse Rate:  [52-59] 59  (09/23 0822) Resp:  [16-18] 16  (09/23 0822) BP: (126-155)/(40-52) 126/40 mmHg (09/23 0822) SpO2:  [97 %-100 %] 97 % (09/23 0822) Weight:  [160 lb 14.4 oz (72.984 kg)-161 lb 1.6 oz (73.074 kg)] 161 lb 1.6 oz (73.074 kg) (09/23 0602) Last BM Date: 12/06/11 General: pleasant, looks pale and weak Heart: RRR Chest: clear B. Not SOB Abdomen: soft, minor non-focal tenderness in mid and upper abdomen.   Extremities: no pedal edema Neuro/Psych:  Not acutely confused.  Oriented to place and self.  Follows commands and is appropriate.   Intake/Output from previous day: 09/22 0701 - 09/23 0700 In: 840 [P.O.:840] Out: 1375 [Urine:1375]  Intake/Output this shift: Total I/O In: -  Out: 100 [Urine:100]  Lab Results:  [facility] 12/07/11 0050 12/06/11 0954  WBC 8.4 9.5  HGB 7.8* 9.3*  HCT 22.2* 27.4*  PLT 175 224   BMET  Basename 12/07/11 0828  NA 139  K 4.8  CL 111  CO2 19  GLUCOSE 213*  BUN 46*  CREATININE 1.05  CALCIUM 8.8   No coags, have been normal in past.   ASSESMENT: *  Acute GI bleed, recurrent. 05/2011 EGD: normal. Vasovagal event this morning. Colon: severe pan-diverticulosis, internal hemorrhoids. 2013 capsule study: normal. 2009 had GIB from [facility].  Maintains on once daily Protonix at home. Currently on protonix IV BID. *  IV access issues. 3rd IV team member trying to get access now, if unsuccesful, will need a PICC or central line. *  ABL anemia, recurrent. Hgb down about 3.5 grams. Blood ordered but unable to yet transfuse due to lack of IV access.  Nadir Hgb in 04/2011 was 7.0. Maintains on Iron 325 mg BID at home.  *  Azotemia, recurrent.  Note BUN of 52 -53 duringing Feb and March GIB  *  Chronic Plavix, 81 mg ASA.  On hold  *  IDDM.  *  Dementia, not severe.  *  PVD, CAD, hx amaurosis fugax, carotid bruit, hx CVA.  CABG 1999.  All heart, vascular meds on hold  PLAN: *  Rescheduled the EGD for noon tomorrow 9/24. Await stabilization after vasovagal event, transfusions and needs IV access. Discussed with her dtr in law who is a Software engineer, Company secretary.  *  CBC for 1300 today. Monitor Hb/Hct closely. *  Ok to have ice chips, sips clear until midnight tonight.  IVF of NS currently at 75 ml/hour.     LOS: 1 day   Destiny [name]  12/07/2011, 11:51 AM Pager: [phone]    I have taken an interval history, reviewed the chart and examined the patient. I agree with the extender's note, impression and recommendations. Recurrent GI bleeding-etiology unclear. Awaiting IV access and blood transfusion. EGD tomorrow.  Pricilla Riffle. Fuller Plan MD [facility]

## 2011-12-07 NOTE — Progress Notes (Signed)
Utilization Review Completed.  

## 2011-12-07 NOTE — Progress Notes (Signed)
Pt sitting on bedside commode having a bowel movement and noted to be feeling very weak and assisted back to bed.  Skin noted to be diaphoretic  and blood sugar checked to be 289.  Had medium black stool.  Pt assisted back to be.  BP148/112, on recheck BP 144.42.  Rapid Response and Dr. Dyann Kief notified.   Will continue to monitor.

## 2011-12-07 NOTE — Progress Notes (Signed)
2 units PRBs infused, pt tolerated well.

## 2011-12-08 ENCOUNTER — Encounter (HOSPITAL_COMMUNITY): Payer: Self-pay | Admitting: Gastroenterology

## 2011-12-08 ENCOUNTER — Encounter (HOSPITAL_COMMUNITY)
Admission: EM | Disposition: A | Discharge status: Home / Self Care | Payer: Self-pay | Source: Home / Self Care | Attending: Internal Medicine

## 2011-12-08 DIAGNOSIS — K263 Acute duodenal ulcer without hemorrhage or perforation: Secondary | ICD-10-CM

## 2011-12-08 HISTORY — PX: ESOPHAGOGASTRODUODENOSCOPY: SHX5428

## 2011-12-08 LAB — CBC
HCT: 26.1 % — ABNORMAL LOW (ref 36.0–46.0)
HCT: 26.5 % — ABNORMAL LOW (ref 36.0–46.0)
Hemoglobin: 8.9 g/dL — ABNORMAL LOW (ref 12.0–15.0)
Hemoglobin: 8.8 g/dL — ABNORMAL LOW (ref 12.0–15.0)
MCH: 28.8 pg (ref 26.0–34.0)
MCHC: 33.2 g/dL (ref 30.0–36.0)
MCV: 84.5 fL (ref 78.0–100.0)
Platelets: 167 10*3/uL (ref 150–400)
RBC: 3.09 MIL/uL — ABNORMAL LOW (ref 3.87–5.11)
WBC: 10.7 10*3/uL — ABNORMAL HIGH (ref 4.0–10.5)
WBC: 8 10*3/uL (ref 4.0–10.5)

## 2011-12-08 LAB — RETICULOCYTES
Retic Count, Absolute: 81.8 10*3/uL (ref 19.0–186.0)
Retic Ct Pct: 2.9 % (ref 0.4–3.1)

## 2011-12-08 LAB — GLUCOSE, CAPILLARY: Glucose-Capillary: 193 mg/dL — ABNORMAL HIGH (ref 70–99)

## 2011-12-08 LAB — TYPE AND SCREEN: Unit division: 0

## 2011-12-08 SURGERY — EGD (ESOPHAGOGASTRODUODENOSCOPY)
Anesthesia: Moderate Sedation

## 2011-12-08 MED ORDER — IRON DEXTRAN 50 MG/ML IJ SOLN
500.0000 mg | Freq: Once | INTRAMUSCULAR | Status: AC
  Administered 2011-12-09: 500 mg via INTRAVENOUS
  Filled 2011-12-08 (×2): qty 10

## 2011-12-08 MED ORDER — ATORVASTATIN CALCIUM 40 MG PO TABS
40.0000 mg | ORAL_TABLET | Freq: Every day | ORAL | Status: DC
  Administered 2011-12-08 – 2011-12-10 (×3): 40 mg via ORAL
  Filled 2011-12-08 (×3): qty 1

## 2011-12-08 MED ORDER — SODIUM CHLORIDE 0.9 % IV SOLN
25.0000 mg | Freq: Once | INTRAVENOUS | Status: AC
  Administered 2011-12-09: 25 mg via INTRAVENOUS
  Filled 2011-12-08 (×2): qty 0.5

## 2011-12-08 MED ORDER — FENTANYL CITRATE 0.05 MG/ML IJ SOLN
INTRAMUSCULAR | Status: AC
  Filled 2011-12-08: qty 2

## 2011-12-08 MED ORDER — MIDAZOLAM HCL 10 MG/2ML IJ SOLN
INTRAMUSCULAR | Status: DC | PRN
  Administered 2011-12-08: 2 mg via INTRAVENOUS

## 2011-12-08 MED ORDER — FENTANYL CITRATE 0.05 MG/ML IJ SOLN
INTRAMUSCULAR | Status: DC | PRN
  Administered 2011-12-08: 25 ug via INTRAVENOUS

## 2011-12-08 MED ORDER — MIDAZOLAM HCL 5 MG/ML IJ SOLN
INTRAMUSCULAR | Status: AC
  Filled 2011-12-08: qty 2

## 2011-12-08 MED ORDER — BUTAMBEN-TETRACAINE-BENZOCAINE 2-2-14 % EX AERO
INHALATION_SPRAY | CUTANEOUS | Status: DC | PRN
  Administered 2011-12-08: 2 via TOPICAL

## 2011-12-08 NOTE — Op Note (Addendum)
[facility], [id number]   ENDOSCOPY PROCEDURE REPORT  PATIENT: Destiny, [name]  MR#: [id number] BIRTHDATE: [birth year] , 80  yrs. old GENDER: Female ENDOSCOPIST: Ladene Artist, MD, [facility]  PROCEDURE DATE:  12/08/2011 PROCEDURE:  EGD, diagnostic ASA CLASS:     Class III INDICATIONS:  hematochezia,  acute post hemorrhagic anemia. MEDICATIONS: These medications were titrated to patient response per physician's verbal order, Fentanyl 25 mcg IV, and Versed 2 mg IV TOPICAL ANESTHETIC: Cetacaine Spray DESCRIPTION OF PROCEDURE: After the risks benefits and alternatives of the procedure were thoroughly explained, informed consent was obtained.  The Pentax Gastroscope [id number] endoscope was introduced through the mouth and advanced to the second portion of the duodenum. Without limitations.  The instrument was slowly withdrawn as the mucosa was fully examined.   DUODENUM: A single non-bleeding round, deep and clean-based ulcer, measuring 7 x 3mm in size, with surrounding edema was found in the duodenal bulb.   The duodenal mucosa showed no abnormalities in the 2nd part of the duodenum. STOMACH: The mucosa of the stomach appeared normal. ESOPHAGUS: The mucosa of the esophagus appeared normal.  Retroflexed views revealed no abnormalities.     The scope was then withdrawn from the patient and the procedure completed.  COMPLICATIONS: There were no complications.  ENDOSCOPIC IMPRESSION: 1.   Non-bleeding duodenal ulcer   RECOMMENDATIONS: 1.  Avoid NSAIDS 2.  Check serum Helicobacter pylori Ab, treat if positive 3.  Continue PPI bid for 2 weeks then qam 4.  Follow up with Dr. Carlean Purl    eSigned:  Ladene Artist, MD, [facility] 12/08/2011 11:54 AM Revised: 12/08/2011 11:54 AM [id number] Carlean Purl, MD

## 2011-12-08 NOTE — Progress Notes (Signed)
CBG 130 no Insulin given

## 2011-12-08 NOTE — Progress Notes (Signed)
Pt manual diasys BP 116/40 informed MD. Checked 47min later BP 121/60. MD said continue to monitor PT was Asym.

## 2011-12-08 NOTE — Interval H&P Note (Signed)
History and Physical Interval Note:  12/08/2011 11:21 AM  Destiny [name]  has presented today for surgery, with the diagnosis of Epigastric pain, Anemia, and hematochezia/melena  The various methods of treatment have been discussed with the patient and family. After consideration of risks, benefits and other options for treatment, the patient has consented to  Procedure(s) (LRB) with comments: ESOPHAGOGASTRODUODENOSCOPY (EGD) (N/A) as a surgical intervention .  The patient's history has been reviewed, patient examined, no change in status, stable for surgery.  I have reviewed the patient's chart and labs.  Questions were answered to the patient's satisfaction.     Pricilla Riffle. Fuller Plan MD [facility]al

## 2011-12-08 NOTE — Progress Notes (Signed)
[facility] PROGRESS NOTE  PRAISE OGILVIE [id number] DOB: [birth year] DOA: 12/06/2011 PCP: Jeb Levering, Philbert Riser, MD  Assessment/Plan: Roxanne Gates -2/2 to GIB -Workup and tx as mentioned below -Continue iron supplementation as an outpatient -Iron infusion per pharmacy given   Hematochezia  -Will continue holding ASA/Plavix.  -EGD today; with positive duodenal ulcer -Continue on IV BID PPI for another day -Then change to PO BID for 2 weeks -Follow up with Dr. Carlean Purl as an outpatient. -Close follow up of Hgb for another 24 hrs; if remains stable; advance diet and discharge home -Serum Helicobacter pylori test has been placed following GI recommendation, follow results and treat accordingly if needed.  H/o CVA  -Plavix and aspirin/NSAIDS on hold due to GI ulcer and acute bleeding  Hyperlipidemia  -Will resume statin   HTN  -Continue holding BP meds due to settings of acute bleeding.  -Blood pressure stable.  IDDM  -Continue lantus and SSI.  -A1C 7.6  DVT Prophylaxis  -SCDs.   Code Status: Full Family Communication: no family at bedside Disposition Plan: home when medically stable   Brief narrative: 78 y/o with hx of PUD, diverticulosis and also CAD; admitted secondary to hematochezia and melena. Hgb down from 11.1 to 7.8   Consultants:  GI (Dr. Benson Norway)  Procedures:  EGD (positive for a single nonbleeding, clear base duodenal ulcer)  Antibiotics:  none  HPI/Subjective: Patient without any further bleeding; also without any further near syncope episodes. Hemoglobin 8.9 after 2 units transfused yesterday. Afebrile  Objective: Filed Vitals:   12/08/11 1150 12/08/11 1200 12/08/11 1210 12/08/11 1220  BP: 126/52  143/84   Pulse:      Temp:      TempSrc:      Resp: 20 16 16 16   Height:      Weight:      SpO2: 99% 95% 95% 95%    Intake/Output Summary (Last 24 hours) at 12/08/11 1435 Last data filed at 12/08/11 1321  Gross per 24 hour  Intake     950 ml  Output   1575 ml  Net   -625 ml   Filed Weights   12/06/11 1439 12/07/11 0602 12/08/11 0557  Weight: 72.984 kg (160 lb 14.4 oz) 73.074 kg (161 lb 1.6 oz) 71.7 kg (158 lb 1.1 oz)    Exam:   General:  NAD; denies SOB or CP; afebrile  Cardiovascular: S1 and S2; no rubs or gallops  Respiratory: CTA bilaterally  Abdomen: soft, NT, ND; positive BS  Extremities: no edema or cyanosis  Neuro: non focal deficit  Data Reviewed: Basic Metabolic Panel:  Lab [id number] 0828  NA 139  K 4.8  CL 111  CO2 19  GLUCOSE 213*  BUN 46*  CREATININE 1.05  CALCIUM 8.8  MG --  PHOS --   CBC:  Lab 12/08/11 1313 12/08/11 0030 12/07/11 1722 12/07/11 0050 12/06/11 0954  WBC 8.0 10.7* 9.3 8.4 9.5  NEUTROABS -- -- -- -- 7.2  HGB 8.8* 8.9* 8.9* 7.8* 9.3*  HCT 26.5* 26.1* 26.7* 22.2* 27.4*  MCV 86.0 84.5 88.1 87.1 87.8  PLT 170 167 195 175 224   Cardiac Enzymes:  Lab 12/06/11 1219  CKTOTAL --  CKMB --  CKMBINDEX --  TROPONINI <0.30   CBG:  Lab 12/08/11 0552 12/07/11 2208 12/07/11 1639 12/07/11 1034 12/07/11 0612  GLUCAP 117* 164* 155* 289* 145*    Scheduled Meds:    . furosemide  40 mg Intravenous Once  . insulin aspart  0-9  Units Subcutaneous TID WC  . insulin glargine  40 Units Subcutaneous QHS  . pantoprazole (PROTONIX) IV  40 mg Intravenous Q12H  . sodium chloride  3 mL Intravenous Q12H   Continuous Infusions:    . sodium chloride 75 mL/hr (12/08/11 1303)  . DISCONTD: sodium chloride 500 mL (12/08/11 1057)     Time spent: > 30 minutes   Neleh Muldoon  [facility] Pager [phone]. If 8PM-8AM, please contact night-coverage at [email or web address], password [facility] 12/08/2011, 2:35 PM  LOS: 2 days

## 2011-12-08 NOTE — Progress Notes (Signed)
     [facility] Daily Rounding Note 12/08/2011, 10:52 AM  SUBJECTIVE:       No recurrent dizziness or bloody stools.  No belly pain or nausea.  Feels better.   OBJECTIVE:         Vital signs in last 24 hours:    Temp:  [97.3 F (36.3 C)-99.4 F (37.4 C)] 97.9 F (36.6 C) (09/24 1046) Pulse Rate:  [58-69] 69  (09/24 1046) Resp:  [18-28] 28  (09/24 1046) BP: (114-150)/(38-77) 122/55 mmHg (09/24 1046) SpO2:  [97 %-100 %] 100 % (09/24 1046) Weight:  [158 lb 1.1 oz (71.7 kg)] 158 lb 1.1 oz (71.7 kg) (09/24 0557) Last BM Date: 12/07/11 General: looks chronically unwell.   Heart: RRR Chest: clear B.  No resp distress.  Abdomen: soft, NT, ND.  Active BS  Extremities: no pedal edema Neuro/Psych:  Pleasant, cooperative, appropriate, not confused.   Intake/Output from previous day: 09/23 0701 - 09/24 0700 In: 1300 [P.O.:600; Blood:700] Out: 1876 [Urine:1875; Stool:1]  Lab Results:  Basename 12/08/11 0030 12/07/11 1722 12/07/11 0050  WBC 10.7* 9.3 8.4  HGB 8.9* 8.9* 7.8*  HCT 26.1* 26.7* 22.2*  PLT 167 195 175   BMET  Basename 12/07/11 0828  NA 139  K 4.8  CL 111  CO2 19  GLUCOSE 213*  BUN 46*  CREATININE 1.05  CALCIUM 8.8   ASSESMENT: * Acute GI bleed, recurrent.  Vasovagal event in setting of passing blood 9/23.  05/2011 EGD: normal.  Colon: severe pan-diverticulosis, internal hemorrhoids. 2013 capsule study: normal. 2009 had GIB from [facility]. Maintains on once daily Protonix at home. Currently on protonix IV BID.  * IV access issues. Team able to place peripheral IV on 9/23.  * ABL anemia, recurrent. Hgb down about 3.5 grams overall. S/P 2 units PRBC with appropriate bump of Hgb.  Nadir Hgb in 04/2011 was 7.0. Maintains on Iron 325 mg BID at home.  * Azotemia, recurrent. Note BUN of 52 -53 duringing Feb and March GIB  * Chronic Plavix, 81 mg ASA. On hold  * IDDM.  * Dementia, not severe.  * PVD, CAD, hx amaurosis fugax, carotid bruit, hx CVA. CABG 1999. All heart, vascular  meds on hold  PLAN: *  EGD today.    LOS: 2 days   Azucena Freed  12/08/2011, 10:52 AM Pager: [phone]    I have taken an interval history, reviewed the chart and examined the patient. I agree with the extender's note, impression and recommendations. No recurrent bleeding or epigastric pain. After EGD is completed there is no additional GI evaluation is planned at this time.  Pricilla Riffle. Fuller Plan MD [facility] Regal

## 2011-12-08 NOTE — Progress Notes (Signed)
MEDICATION RELATED CONSULT NOTE - INITIAL   Pharmacy Consult for IV iron Indication:  Possible iron-deficiency anemia  No Known Allergies  Patient Measurements: Height: 5\' 6"  (167.6 cm) Weight: 158 lb 1.1 oz (71.7 kg) IBW/kg (Calculated) : 59.3   Vital Signs: Temp: 97.8 F (36.6 C) (09/24 1500) Temp src: Oral (09/24 1500) BP: 124/60 mmHg (09/24 1537) Pulse Rate: 57  (09/24 1500) Intake/Output from previous day: 09/23 0701 - 09/24 0700 In: 1300 [P.O.:600; Blood:700] Out: 1876 [id number]; Stool:1]  Labs:  Basename 12/08/11 1313 12/08/11 0030 12/07/11 1722 12/07/11 0828  WBC 8.0 10.7* 9.3 --  HGB 8.8* 8.9* 8.9* --  HCT 26.5* 26.1* 26.7* --  PLT 170 167 195 --  APTT -- -- -- --  CREATININE -- -- -- 1.05  LABCREA -- -- -- --  CREATININE -- -- -- 1.05  CREAT24HRUR -- -- -- --  MG -- -- -- --  PHOS -- -- -- --  ALBUMIN -- -- -- --  PROT -- -- -- --  ALBUMIN -- -- -- --  AST -- -- -- --  ALT -- -- -- --  ALKPHOS -- -- -- --  BILITOT -- -- -- --  BILIDIR -- -- -- --  IBILI -- -- -- --   Estimated Creatinine Clearance: 44.8 ml/min (by C-G formula based on Cr of 1.05).  Medical History: Past Medical History  Diagnosis Date  . Undiagnosed cardiac murmurs   . PVD (peripheral vascular disease)   . CAD (coronary artery disease)   . Hypertension   . Hypernatremia   . Hyperlipidemia   . Duodenal ulcer     acute hemorrhage  . Hyperkalemia   . Achlorhydria   . Iron deficiency anemia secondary to blood loss (chronic)   . Amaurosis fugax   . Carotid bruit   . Diabetes mellitus type II   . Dementia     pt denies  . Diabetic retinopathy   . Gallstones 2009    seen on CT scan  . Diverticulosis   . Atherosclerosis   . Renal insufficiency   . CVA (cerebral infarction)   . Diabetic peripheral neuropathy   . GI bleed 04/2011    Cause unclear   Assessment:  Admitted 9/22 pm with recurrent GI bleed. S/p EGD today, non-bleeding duodenal ulcer. Prior GIB in February  2013 due to diverticulosis and internal hemorrhoids and in 2009 from duodenal ulcer. Had been on Ferrous sulfate 325 mg BID and Protonix 40 mg daily prior to admission.   Hgb 11.1 in June, but down to 7.9 on 9/23.   Received 2 units PRBCs on 9/23, Hgb up to 8.8 today.  Off home Aspirin and Plavix. Currently on Protonix 40 IV q12hrs.  Goal of Therapy:  Iron in normal range (60-150) Iron saturation 20-50% Hemoglobin 12-14  Plan:    Will check anemia panel this afternoon, to evaluate iron stores.   Plan Infed test dose, then replacement dose based on iron level.  Arty Baumgartner, [place] Pager: [phone] 12/08/2011,4:25 PM

## 2011-12-09 ENCOUNTER — Encounter (HOSPITAL_COMMUNITY): Payer: Self-pay | Admitting: Gastroenterology

## 2011-12-09 DIAGNOSIS — D649 Anemia, unspecified: Secondary | ICD-10-CM

## 2011-12-09 DIAGNOSIS — K922 Gastrointestinal hemorrhage, unspecified: Principal | ICD-10-CM

## 2011-12-09 LAB — CBC
HCT: 22.4 % — ABNORMAL LOW (ref 36.0–46.0)
HCT: 23.3 % — ABNORMAL LOW (ref 36.0–46.0)
HCT: 22.8 % — ABNORMAL LOW (ref 36.0–46.0)
Hemoglobin: 7.8 g/dL — ABNORMAL LOW (ref 12.0–15.0)
Hemoglobin: 7.7 g/dL — ABNORMAL LOW (ref 12.0–15.0)
MCH: 29.3 pg (ref 26.0–34.0)
MCHC: 33.9 g/dL (ref 30.0–36.0)
MCHC: 33.5 g/dL (ref 30.0–36.0)
MCV: 86.5 fL (ref 78.0–100.0)
RBC: 2.6 MIL/uL — ABNORMAL LOW (ref 3.87–5.11)
RDW: 15.6 % — ABNORMAL HIGH (ref 11.5–15.5)
RDW: 15.5 % (ref 11.5–15.5)
WBC: 7.2 10*3/uL (ref 4.0–10.5)
WBC: 6.3 10*3/uL (ref 4.0–10.5)

## 2011-12-09 LAB — TROPONIN I: Troponin I: <0.3 ng/mL (ref <0.30)

## 2011-12-09 LAB — IRON AND TIBC
Iron: 44 ug/dL (ref 42–135)
UIBC: 206 ug/dL (ref 125–400)

## 2011-12-09 LAB — POCT I-STAT, CHEM 8
BUN: 45 mg/dL — ABNORMAL HIGH (ref 6–23)
Creatinine, Ser: 1.3 mg/dL — ABNORMAL HIGH (ref 0.50–1.10)
Potassium: 5.5 mEq/L — ABNORMAL HIGH (ref 3.5–5.1)
Sodium: 145 mEq/L (ref 135–145)
TCO2: 20 mmol/L (ref 0–100)

## 2011-12-09 LAB — FERRITIN: Ferritin: 45 ng/mL (ref 10–291)

## 2011-12-09 LAB — GLUCOSE, CAPILLARY: Glucose-Capillary: 172 mg/dL — ABNORMAL HIGH (ref 70–99)

## 2011-12-09 MED ORDER — NITROGLYCERIN 0.4 MG SL SUBL
SUBLINGUAL_TABLET | SUBLINGUAL | Status: AC
  Administered 2011-12-09: 0.4 mg
  Filled 2011-12-09: qty 75

## 2011-12-09 MED ORDER — SODIUM CHLORIDE 0.9 % IV SOLN
250.0000 mg | Freq: Once | INTRAVENOUS | Status: AC
  Administered 2011-12-10: 250 mg via INTRAVENOUS
  Filled 2011-12-09 (×2): qty 5

## 2011-12-09 NOTE — Progress Notes (Signed)
     [facility] Daily Rounding Note 12/09/2011, 2:53 PM  SUBJECTIVE:       Hgb down one gram in last 24 hours.  Stools yesterday weredark and greenish per pt nurse.nnno stools yet today.  .   Had some short lived chest pain relieved with SL NTG.  EKG normal.  She has had similar chest pain relieved with NTG at home.   OBJECTIVE:         Vital signs in last 24 hours:    Temp:  [97.2 F (36.2 C)-97.9 F (36.6 C)] 97.2 F (36.2 C) (09/25 1109) Pulse Rate:  [51-62] 62  (09/25 1305) Resp:  [18-20] 20  (09/25 1305) BP: (116-190)/(36-64) 134/54 mmHg (09/25 1305) SpO2:  [97 %-100 %] 98 % (09/25 1305) Weight:  [162 lb (73.483 kg)] 162 lb (73.483 kg) (09/25 0512) Last BM Date: 12/09/11 General: chronically ill looking, NAD   Heart: RRR Chest: ckear, no labored breathing. Abdomen: soft, ND, NT.  Active BS  Extremities: no pedal edema Neuro/Psych:  Appropriate, oriented to place and time.   Intake/Output from previous day: 09/24 0701 - 09/25 0700 In: 720 [P.O.:720] Out: 400 [Urine:400]  Intake/Output this shift: Total I/O In: 360 [P.O.:360] Out: 450 [Urine:450]  Lab Results:  [facility] 12/09/11 1355 12/09/11 0052 12/08/11 1313  WBC 6.4 7.2 8.0  HGB 7.8* 7.6* 8.8*  HCT 23.3* 22.4* 26.5*  PLT 157 142* 170   BMET  Basename 12/07/11 0828  NA 139  K 4.8  CL 111  CO2 19  GLUCOSE 213*  BUN 46*  CREATININE 1.05  CALCIUM 8.8    ASSESMENT:  * Acute GI bleed with dark, bloody stools, recurrent. No recurrence of bleeding over past few days 05/2011 EGD: normal.  05/2011 Colon: severe pan-diverticulosis, internal hemorrhoids. 2013 capsule study: normal. 2009 had GIB from [facility]. Maintains on once daily Protonix at home. EGD 12/09/11 with non-bleeding duodenal ulcer. Currently on BID IV Protonix.  *  Acute anemia.  Infed 9/24, 9/25. 2 units RBCs 9/23. Hb fluctuating but no evidence of rebleeding. *  Chronic ASA and Plavix. On hold.     PLAN: *  Follow up CBC in AM *  Await serum H  Pylori ab. Treat if positive. *  2 weeks of BID PPI, then once weekly. Will switch to po.  *  Carb mod diet.  *  Should not restart Plavix/ASA for 2 weeks starting 12/08/11. *  Outpatient follow up with Dr. Silvano Rusk   LOS: 3 days   Azucena Freed  12/09/2011, 2:53 PM Pager: [phone]    I have taken an interval history, reviewed the chart and examined the patient. I agree with the extender's note, impression and recommendations.   Pricilla Riffle. Fuller Plan MD [facility]

## 2011-12-09 NOTE — Progress Notes (Signed)
Page MD. Heart Rhythm change NSR/SB to HB 1 pr .22. Tele reading 530. Tele read 830 NSR pr.18.  EKG 12 lead done per MD. EKG SB pr.19. Will continue to monitor

## 2011-12-09 NOTE — Progress Notes (Addendum)
[facility] PROGRESS NOTE  Destiny [name] [id number] DOB: November 19, 1931 DOA: 12/06/2011 PCP: Jeb Levering, Philbert Riser, MD  Assessment/Plan: Principal Problem:  *Hematochezia Active Problems:  HYPERTENSION  CVA  Hyperlipidemia  Epigastric pain  Diabetes mellitus  Acute blood loss anemia  Duodenal ulcer, acute    ABLA  -2/2 to GIB  -Workup and tx as mentioned below  -Continue iron supplementation , received InFeD   Hematochezia  None overnight -Will continue holding ASA/Plavix.  -EGD showed a nonbleeding round deep and clean-based ulcer -Continue on IV BID PPI for another day  -Then change to PO BID for 2 weeks  -Follow up with Dr. Carlean Purl as an outpatient.  -Close follow up of Hgb for another 24 hrs; if remains stable; advance diet and discharge home  -Serum Helicobacter pylori test has been placed following GI recommendation, follow results and treat accordingly if needed.  Hemoglobin still trending down, that she require another transfusion? Per Dr. Fuller Plan patient  Is equilibrating ,   H/o CVA  -Plavix and aspirin/NSAIDS on hold due to GI ulcer and acute bleeding , appreciate GI input  Hyperlipidemia  -Will resume statin   HTN  -Continue holding BP meds due to settings of acute bleeding.  -Blood pressure stable.   IDDM  -Continue lantus and SSI.  -A1C 7.6   DVT Prophylaxis  -SCDs.    Code Status: Full  Family Communication: no family at bedside  Disposition Plan: Not stable for discharge because of hemoglobin trending down   Brief narrative:  78 y/o with hx of PUD, diverticulosis and also CAD; admitted secondary to hematochezia and melena. Hgb down from 11.1 to 7.8  Consultants:  GI (Dr. Benson Norway) Procedures:  EGD (positive for a single nonbleeding, clear base duodenal ulcer) Antibiotics:  none   HPI/Subjective:  Patient without any further bleeding; also without any further near syncope episodes. Hemoglobin 8.9 after 2 units transfused  yesterday. Afebrile     Objective: Filed Vitals:   12/08/11 1537 12/08/11 2106 12/09/11 0439 12/09/11 0512  BP: 124/60 134/44 136/60   Pulse:  57 51   Temp:  97.2 F (36.2 C) 97.9 F (36.6 C)   TempSrc:  Oral Oral   Resp:  20 18   Height:      Weight:    73.483 kg (162 lb)  SpO2:  99% 99%     Intake/Output Summary (Last 24 hours) at 12/09/11 0750 Last data filed at 12/09/11 0500  Gross per 24 hour  Intake    720 ml  Output    400 ml  Net    320 ml    Exam:  General: NAD; denies SOB or CP; afebrile  Cardiovascular: S1 and S2; no rubs or gallops  Respiratory: CTA bilaterally  Abdomen: soft, NT, ND; positive BS  Extremities: no edema or cyanosis  Neuro: non focal deficit     Data Reviewed: Basic Metabolic Panel:  Lab [id number] 0828  NA 139  K 4.8  CL 111  CO2 19  GLUCOSE 213*  BUN 46*  CREATININE 1.05  CALCIUM 8.8  MG --  PHOS --    Liver Function Tests: No results found for this basename: AST:5,ALT:5,ALKPHOS:5,BILITOT:5,PROT:5,ALBUMIN:5 in the last 168 hours No results found for this basename: LIPASE:5,AMYLASE:5 in the last 168 hours No results found for this basename: AMMONIA:5 in the last 168 hours  CBC:  Lab 12/09/11 0052 12/08/11 1313 12/08/11 0030 12/07/11 1722 12/07/11 0050 12/06/11 0954  WBC 7.2 8.0 10.7* 9.3 8.4 --  NEUTROABS -- -- -- -- -- 7.2  HGB 7.6* 8.8* 8.9* 8.9* 7.8* --  HCT 22.4* 26.5* 26.1* 26.7* 22.2* --  MCV 86.5 86.0 84.5 88.1 87.1 --  PLT 142* 170 167 195 175 --    Cardiac Enzymes:  Lab 12/06/11 1219  CKTOTAL --  CKMB --  CKMBINDEX --  TROPONINI <0.30   BNP (last 3 results) No results found for this basename: PROBNP:3 in the last 8760 hours   CBG:  Lab 12/08/11 2115 12/08/11 1644 12/08/11 1253 12/08/11 0552 12/07/11 2208  GLUCAP 193* 244* 130* 117* 164*    No results found for this or any previous visit (from the past 240 hour(s)).   Studies: No results found.  Scheduled Meds:   . atorvastatin  40 mg Oral  q1800  . insulin aspart  0-9 Units Subcutaneous TID WC  . insulin glargine  40 Units Subcutaneous QHS  . iron dextran (INFED/DEXFERRUM) infusion  25 mg Intravenous Once   Followed by  . iron dextran (INFED/DEXFERRUM) infusion  500 mg Intravenous Once  . pantoprazole (PROTONIX) IV  40 mg Intravenous Q12H  . sodium chloride  3 mL Intravenous Q12H   Continuous Infusions:   . sodium chloride 75 mL/hr (12/08/11 1303)  . DISCONTD: sodium chloride 500 mL (12/08/11 1057)    Principal Problem:  *Hematochezia Active Problems:  HYPERTENSION  CVA  Hyperlipidemia  Epigastric pain  Diabetes mellitus  Acute blood loss anemia  Duodenal ulcer, acute    Time spent: 40 minutes   [facility] Pager [phone]. If 8PM-8AM, please contact night-coverage at [email or web address], password [facility] 12/09/2011, 7:50 AM  LOS: 3 days

## 2011-12-09 NOTE — Progress Notes (Signed)
MEDICATION RELATED CONSULT NOTE - FOLLOW UP  Pharmacy Consult for  IV iron Indication: Mild iron-deficiency anemia  No Known Allergies  Patient Measurements: Height: 5\' 6"  (167.6 cm) Weight: 162 lb (73.483 kg) IBW/kg (Calculated) : 59.3   Vital Signs: Temp: 97.2 F (36.2 C) (09/25 1109) Temp src: Oral (09/25 1109) BP: 155/54 mmHg (09/25 1109) Pulse Rate: 60  (09/25 1109) Intake/Output from previous day: 09/24 0701 - 09/25 0700 In: 720 [P.O.:720] Out: 400 [Urine:400] Intake/Output from this shift: Total I/O In: 360 [P.O.:360] Out: -   Labs:  Basename 12/09/11 0052 12/08/11 1313 12/08/11 0030 12/07/11 0828  WBC 7.2 8.0 10.7* --  HGB 7.6* 8.8* 8.9* --  HCT 22.4* 26.5* 26.1* --  PLT 142* 170 167 --  APTT -- -- -- --  CREATININE -- -- -- 1.05  LABCREA -- -- -- --  CREATININE -- -- -- 1.05  CREAT24HRUR -- -- -- --  MG -- -- -- --  PHOS -- -- -- --  ALBUMIN -- -- -- --  PROT -- -- -- --  ALBUMIN -- -- -- --  AST -- -- -- --  ALT -- -- -- --  ALKPHOS -- -- -- --  BILITOT -- -- -- --  BILIDIR -- -- -- --  IBILI -- -- -- --   Estimated Creatinine Clearance: 45.3 ml/min (by C-G formula based on Cr of 1.05).   Microbiology: No results found for this or any previous visit (from the past 720 hour(s)).  Medications:  Scheduled:    . atorvastatin  40 mg Oral q1800  . insulin aspart  0-9 Units Subcutaneous TID WC  . insulin glargine  40 Units Subcutaneous QHS  . iron dextran (INFED/DEXFERRUM) infusion  25 mg Intravenous Once   Followed by  . iron dextran (INFED/DEXFERRUM) infusion  500 mg Intravenous Once  . pantoprazole (PROTONIX) IV  40 mg Intravenous Q12H  . sodium chloride  3 mL Intravenous Q12H    Assessment: Admitted 9/22 pm with recurrent GI bleed. S/p EGD today, non-bleeding duodenal ulcer. Prior GIB in February 2013 due to diverticulosis and internal hemorrhoids and in 2009 from duodenal ulcer. Had been on Ferrous sulfate 325 mg BID and Protonix 40 mg  daily prior to admission. Hgb 11.1 in June, but down to 7.9 on 9/23. Received 2 units PRBCs on 9/23, Hgb up to 8.8 today. Off home Aspirin and Plavix.  Currently on Protonix 40 IV q12hrs.  Based on Iron studies (iron 44, sats 18), calculated dose of iron dextran needed is 800mg  total (based on lower goal of 12 in elderly patient). Has received 25mg  test dose and receiving 500mg  this am.   Goal of Therapy:  Iron in normal range (60-150) Iron sats 20-50% Hemoglobin 12-14  Plan:  Will give another dose of 250mg  IV Iron dextran tomorrow to complete total dose needed based on iron studies. Pharmacy will sign off. Please re-consult if needed.   Sloan Leiter, PharmD, BCPS Clinical Pharmacist [phone] 12/09/2011,11:40 AM

## 2011-12-09 NOTE — Progress Notes (Signed)
RBC 2.59; Hgb 7.6; Hct 22.4; Platelets 142. MD made aware.

## 2011-12-09 NOTE — Progress Notes (Signed)
Page MD. Pt stated chest pain and arm pain when on BSC. Set Pt in bed, put on 2l o2 BP 190/55. Gave 1x Nitro, which relieved pain. Did EkG which show no acute normals. rechk BP it was 131/54. MD to cardiac eynm. Michela Pitcher it may be GI related. Pt states they have had this pain before at home and Nitro relieves

## 2011-12-10 ENCOUNTER — Encounter: Payer: Self-pay | Admitting: Internal Medicine

## 2011-12-10 LAB — CBC
MCH: 29.2 pg (ref 26.0–34.0)
Platelets: 183 10*3/uL (ref 150–400)
RBC: 2.6 MIL/uL — ABNORMAL LOW (ref 3.87–5.11)

## 2011-12-10 LAB — H. PYLORI ANTIBODY, IGG: H Pylori IgG: 0.65 {ISR}

## 2011-12-10 LAB — GLUCOSE, CAPILLARY
Glucose-Capillary: 114 mg/dL — ABNORMAL HIGH (ref 70–99)
Glucose-Capillary: 159 mg/dL — ABNORMAL HIGH (ref 70–99)

## 2011-12-10 MED ORDER — PANTOPRAZOLE SODIUM 40 MG PO TBEC: 40.0000 mg | DELAYED_RELEASE_TABLET | Freq: Two times a day (BID) | ORAL | Status: DC

## 2011-12-10 MED ORDER — FUROSEMIDE 20 MG PO TABS: 20.0000 mg | ORAL_TABLET | Freq: Every day | ORAL | Status: DC

## 2011-12-10 MED ORDER — QUINAPRIL HCL 40 MG PO TABS: 20.0000 mg | ORAL_TABLET | Freq: Every day | ORAL | Status: DC

## 2011-12-10 NOTE — Progress Notes (Signed)
Pt discharged per W/C with all belongings and discharge info. Accompanied by son, daughter in law and RN to family car. Delray Alt RN

## 2011-12-10 NOTE — Discharge Summary (Signed)
Physician Discharge Summary  Destiny [name] MRN: [id number] DOB/AGE: 76-Nov-1933 76 y.o.  PCP: Gay Filler, MD   Admit date: 12/06/2011 Discharge date: 12/10/2011  Discharge Diagnoses:     *Hematochezia    HYPERTENSION  CVA  Hyperlipidemia  Epigastric pain  Diabetes mellitus  Acute blood loss anemia  Duodenal ulcer, acute     Medication List     As of 12/10/2011  7:36 AM    STOP taking these medications         aspirin EC 81 MG tablet      clopidogrel 75 MG tablet   Commonly known as: PLAVIX      TAKE these medications         amLODipine 10 MG tablet   Commonly known as: NORVASC   Take 10 mg by mouth daily.      CENTRUM SILVER PO   Take 1 tablet by mouth daily.      colesevelam 625 MG tablet   Commonly known as: WELCHOL   Take 1,875 mg by mouth daily.      donepezil 10 MG tablet   Commonly known as: ARICEPT   Take 10 mg by mouth daily.      Fish Oil 1200 MG Caps   Take 1 capsule by mouth daily.      furosemide 20 MG tablet   Commonly known as: LASIX   Take 1 tablet (20 mg total) by mouth daily.      Glucosamine Chondr 500 Complex Caps   Take 1 capsule by mouth daily.      insulin glargine 100 UNIT/ML injection   Commonly known as: LANTUS   Inject 40 Units into the skin at bedtime.      insulin lispro 100 UNIT/ML injection   Commonly known as: HUMALOG   Inject 5-15 Units into the skin 3 (three) times daily before meals.      Iron 325 (65 FE) MG Tabs   Take 325 mg by mouth 2 (two) times daily.      isosorbide mononitrate 30 MG 24 hr tablet   Commonly known as: IMDUR   Take 30 mg by mouth daily.      memantine 10 MG tablet   Commonly known as: NAMENDA   Take 10 mg by mouth 2 (two) times daily.      metoprolol succinate 25 MG 24 hr tablet   Commonly known as: TOPROL-XL   Take 25 mg by mouth daily.      nitroGLYCERIN 0.4 MG SL tablet   Commonly known as: NITROSTAT   Place 0.4 mg under the tongue every 5 (five)  minutes as needed. Chest pain      pantoprazole 40 MG tablet   Commonly known as: PROTONIX   Take 1 tablet (40 mg total) by mouth 2 (two) times daily.      quinapril 40 MG tablet   Commonly known as: ACCUPRIL   Take 0.5 tablets (20 mg total) by mouth at bedtime.      rosuvastatin 20 MG tablet   Commonly known as: CRESTOR   Take 20 mg by mouth daily.      vitamin B-12 1000 MCG tablet   Commonly known as: CYANOCOBALAMIN   Take 1,000 mcg by mouth daily.      Vitamin D 1000 UNITS capsule   Take 1,000 Units by mouth daily.      vitamin E 400 UNIT capsule   Take 400 Units by mouth daily.  Discharge Condition: Stable Disposition: 01-Home or Self Care   Consults:   Ladene Artist, MD,FACG       Microbiology: No results found for this or any previous visit (from the past 240 hour(s)).   Labs: Results for orders placed during the hospital encounter of 12/06/11 (from the past 48 hour(s))  GLUCOSE, CAPILLARY     Status: Abnormal   Collection Time   12/08/11 12:53 PM      Component Value Range Comment   Glucose-Capillary 130 (*) 70 - 99 mg/dL   CBC     Status: Abnormal   Collection Time   12/08/11  1:13 PM      Component Value Range Comment   WBC 8.0  4.0 - 10.5 K/uL    RBC 3.08 (*) 3.87 - 5.11 MIL/uL    Hemoglobin 8.8 (*) 12.0 - 15.0 g/dL    HCT 26.5 (*) 36.0 - 46.0 %    MCV 86.0  78.0 - 100.0 fL    MCH 28.6  26.0 - 34.0 pg    MCHC 33.2  30.0 - 36.0 g/dL    RDW 15.7 (*) 11.5 - 15.5 %    Platelets 170  150 - 400 K/uL   VITAMIN B12     Status: Abnormal   Collection Time   12/08/11  4:26 PM      Component Value Range Comment   Vitamin B-12 1008 (*) 211 - 911 pg/mL   FOLATE     Status: Normal   Collection Time   12/08/11  4:26 PM      Component Value Range Comment   Folate 17.9     IRON AND TIBC     Status: Abnormal   Collection Time   12/08/11  4:26 PM      Component Value Range Comment   Iron 44  42 - 135 ug/dL    TIBC 250  250 - 470 ug/dL     Saturation Ratios 18 (*) 20 - 55 %    UIBC 206  125 - 400 ug/dL   FERRITIN     Status: Normal   Collection Time   12/08/11  4:26 PM      Component Value Range Comment   Ferritin 45  10 - 291 ng/mL   RETICULOCYTES     Status: Abnormal   Collection Time   12/08/11  4:26 PM      Component Value Range Comment   Retic Ct Pct 2.9  0.4 - 3.1 %    RBC. 2.82 (*) 3.87 - 5.11 MIL/uL    Retic Count, Manual 81.8  19.0 - 186.0 K/uL   GLUCOSE, CAPILLARY     Status: Abnormal   Collection Time   12/08/11  4:44 PM      Component Value Range Comment   Glucose-Capillary 244 (*) 70 - 99 mg/dL    Comment 1 Notify RN     GLUCOSE, CAPILLARY     Status: Abnormal   Collection Time   12/08/11  9:15 PM      Component Value Range Comment   Glucose-Capillary 193 (*) 70 - 99 mg/dL   CBC     Status: Abnormal   Collection Time   12/09/11 12:52 AM      Component Value Range Comment   WBC 7.2  4.0 - 10.5 K/uL    RBC 2.59 (*) 3.87 - 5.11 MIL/uL    Hemoglobin 7.6 (*) 12.0 - 15.0 g/dL    HCT 22.4 (*) 36.0 -  46.0 %    MCV 86.5  78.0 - 100.0 fL    MCH 29.3  26.0 - 34.0 pg    MCHC 33.9  30.0 - 36.0 g/dL    RDW 15.6 (*) 11.5 - 15.5 %    Platelets 142 (*) 150 - 400 K/uL   GLUCOSE, CAPILLARY     Status: Abnormal   Collection Time   12/09/11  6:03 AM      Component Value Range Comment   Glucose-Capillary 113 (*) 70 - 99 mg/dL   GLUCOSE, CAPILLARY     Status: Abnormal   Collection Time   12/09/11 11:31 AM      Component Value Range Comment   Glucose-Capillary 135 (*) 70 - 99 mg/dL    Comment 1 Notify RN     TROPONIN I     Status: Normal   Collection Time   12/09/11  1:54 PM      Component Value Range Comment   Troponin I <0.30  <0.30 ng/mL   CBC     Status: Abnormal   Collection Time   12/09/11  1:55 PM      Component Value Range Comment   WBC 6.4  4.0 - 10.5 K/uL    RBC 2.67 (*) 3.87 - 5.11 MIL/uL    Hemoglobin 7.8 (*) 12.0 - 15.0 g/dL    HCT 23.3 (*) 36.0 - 46.0 %    MCV 87.3  78.0 - 100.0 fL    MCH 29.2   26.0 - 34.0 pg    MCHC 33.5  30.0 - 36.0 g/dL    RDW 15.6 (*) 11.5 - 15.5 %    Platelets 157  150 - 400 K/uL   GLUCOSE, CAPILLARY     Status: Abnormal   Collection Time   12/09/11  4:42 PM      Component Value Range Comment   Glucose-Capillary 172 (*) 70 - 99 mg/dL   CBC     Status: Abnormal   Collection Time   12/09/11  5:07 PM      Component Value Range Comment   WBC 6.3  4.0 - 10.5 K/uL    RBC 2.60 (*) 3.87 - 5.11 MIL/uL    Hemoglobin 7.7 (*) 12.0 - 15.0 g/dL    HCT 22.8 (*) 36.0 - 46.0 %    MCV 87.7  78.0 - 100.0 fL    MCH 29.6  26.0 - 34.0 pg    MCHC 33.8  30.0 - 36.0 g/dL    RDW 15.5  11.5 - 15.5 %    Platelets 158  150 - 400 K/uL   TROPONIN I     Status: Normal   Collection Time   12/09/11  6:47 PM      Component Value Range Comment   Troponin I <0.30  <0.30 ng/mL   GLUCOSE, CAPILLARY     Status: Abnormal   Collection Time   12/09/11  9:21 PM      Component Value Range Comment   Glucose-Capillary 138 (*) 70 - 99 mg/dL   TROPONIN I     Status: Normal   Collection Time   12/10/11  1:44 AM      Component Value Range Comment   Troponin I <0.30  <0.30 ng/mL   CBC     Status: Abnormal   Collection Time   12/10/11  1:50 AM      Component Value Range Comment   WBC 7.2  4.0 - 10.5 K/uL    RBC 2.60 (*)  3.87 - 5.11 MIL/uL    Hemoglobin 7.6 (*) 12.0 - 15.0 g/dL    HCT 22.5 (*) 36.0 - 46.0 %    MCV 86.5  78.0 - 100.0 fL    MCH 29.2  26.0 - 34.0 pg    MCHC 33.8  30.0 - 36.0 g/dL    RDW 15.4  11.5 - 15.5 %    Platelets 183  150 - 400 K/uL   GLUCOSE, CAPILLARY     Status: Abnormal   Collection Time   12/10/11  5:51 AM      Component Value Range Comment   Glucose-Capillary 114 (*) 70 - 99 mg/dL      HPI : 78 year old female with a PMH of GI bleed who is admitted for hematochezia/melena. She reports that she had a bowel movement and it was bloody. She took a shower and continued to have bleeding, which prompted her to present to the ER for further evaluation. Her daughter  reports that she had melena initially and last week the patient reported melena. She had a vague epigastric pain, but no nausea or vomiting. In 2009 Dr. Carlean Purl performed an EGD for melena and he identified a duodenal bulb ulcer secondary to NSAIDs. In 04/2011 she had melena and Dr. Carlean Purl again performed an EGD/Colonoscopy/Capsule endoscopy with findings of pandiverticulosis. No evidence of any ulcers, inflammation, vascular abnormalities, or acute bleeding sites. As a result of her symptoms a GI consultation was requested.  HOSPITAL COURSE:  #1 GI bleeding Endoscopy was done that showed a nonbleeding duodenal ulcer Recommendations were to avoid NSAIDs H. pylori antibody still pending Twice a day PPI for 2 weeks and then every morning Followup with Dr. Carlean Purl Patient did receive 3 units of packed red blood cells during this hospitalization Presenting hemoglobin was 7.8 after 2 units it came up to 8.9 however hemoglobin has drifted down to 7.6 again with no evidence of recurrent bleeding. The ulcer was clean based. Followup recommendations from GI is as follows  Await serum H Pylori ab. Treat if positive.  * 2 weeks of BID PPI, then once weekly. Will switch to po.  * Carb mod diet.  * Should not restart Plavix/ASA for 2 weeks starting 12/08/11.  * Outpatient follow up with Dr. Silvano Rusk  History of CVA Aspirin and Plavix have been put on hold for 2 weeks  Dyslipidemia stable   Hypertension Dose of lisinopril and Lasix were decreased prior to discharge  IDDM Hemoglobin A1c of 7.6 Patient was treated with Lantus and sliding scale insulin    Discharge Exam:  Blood pressure 149/54, pulse 53, temperature 97.4 F (36.3 C), temperature source Oral, resp. rate 19, height 5\' 6"  (1.676 m), weight 73.1 kg (161 lb 2.5 oz), SpO2 98.00%.  General: chronically ill looking, NAD  Heart: RRR  Chest: ckear, no labored breathing.  Abdomen: soft, ND, NT. Active BS  Extremities: no pedal edema   Neuro/Psych: Appropriate, oriented to place and time        Discharge Orders    Future Appointments: Provider: Department: Dept Phone: Center:   12/15/2011 9:30 AM Burnice Logan, MD [facility] [phone] [facility]      Follow-up Information    Follow up with Silvano Rusk, MD. In 2 weeks.   Contact information:   [address] [place] [address] [phone]       Follow up with Jeb Levering, Philbert Riser, MD. Schedule an appointment as soon  as possible for a visit in 5 days. (CBC in one week)    Contact information:   [place] [address] [phone]          Signed: Reyne Dumas 12/10/2011, 7:36 AM

## 2011-12-10 NOTE — Progress Notes (Signed)
Utilization Review Completed.  

## 2011-12-10 NOTE — Progress Notes (Signed)
Went over all discharge instructions with pt including meds, follow up visits and when to call MD. Given handout on GI bleed  T [employer]

## 2011-12-10 NOTE — Progress Notes (Signed)
     [facility] Daily Rounding Note 12/10/2011, 9:10 AM  SUBJECTIVE:       No BMs since 9/25 AM, when they were dark but not bloody. Pt denies dizziness or weakness.   Fells well today.  No N/V   OBJECTIVE:         Vital signs in last 24 hours:    Temp:  [97.2 F (36.2 C)-98 F (36.7 C)] 97.4 F (36.3 C) (09/26 0547) Pulse Rate:  [53-62] 53  (09/26 0547) Resp:  [18-20] 19  (09/26 0547) BP: (134-190)/(50-66) 149/54 mmHg (09/26 0547) SpO2:  [97 %-99 %] 98 % (09/25 2103) Weight:  [161 lb 2.5 oz (73.1 kg)] 161 lb 2.5 oz (73.1 kg) (09/26 0547) Last BM Date: 12/09/11 General: looks the same, chronically unwell, but less pale and more alert today   Heart: RRR Chest: clear.  No resp distress Abdomen: soft, active BS, ND,NT  Extremities: no pedal edema Neuro/Psych:  Pleasant, not confused, alert and engaged  Intake/Output from previous day: 09/25 0701 - 09/26 0700 In: 1060 [P.O.:1060] Out: 2000 [Urine:2000]   Lab Results:  Basename 12/10/11 0150 12/09/11 1707 12/09/11 1355  WBC 7.2 6.3 6.4  HGB 7.6* 7.7* 7.8*  HCT 22.5* 22.8* 23.3*  PLT 183 158 157   H Pylori serum level Pending.  ASSESMENT: * Acute GI bleed with dark, bloody stools, recurrent. No recurrence of bleeding over past few days  05/2011 EGD: normal. 05/2011 Colon: severe pan-diverticulosis, internal hemorrhoids. 2013 capsule study: normal. 2009 had GIB from DU. Maintains on once daily Protonix at home.  EGD 12/09/11 with non-bleeding duodenal ulcer. Currently on BID IV Protonix.  * Acute anemia. Infed 9/24, 9/25. 2 units RBCs 9/23. Hgb is stable.   * Chronic ASA and Plavix. On hold.    PLAN: *  Though not symptomatic from anemia today, would still transfuse one more unit PRBCs to get Hgb above 8.0, before she goes home today.  *  Continue the BID Protonix as per 9/25 outlined plan *  If H Pylori level in positive range, treat with abx, can be done in outpt setting. .  *  10/18 ROV with Dr Carlean Purl   LOS: 4 days     Destiny [name]  12/10/2011, 9:10 AM Pager: [phone]   I have taken an interval history, reviewed the chart and examined the patient. I agree with the extender's note, impression and recommendations. OP GI follow up with Dr. Carlean Purl. We will sign off.  Pricilla Riffle. Fuller Plan MD Marval Regal

## 2011-12-10 NOTE — Progress Notes (Addendum)
Ambulating pt in hallway 200 feet tolerated well. 02 sat 97 to 100 & while ambulating on RA . T Eddis Pingleton RN

## 2011-12-11 LAB — TYPE AND SCREEN
Antibody Screen: NEGATIVE
Unit division: 0

## 2011-12-15 ENCOUNTER — Telehealth: Payer: Self-pay | Admitting: Internal Medicine

## 2011-12-15 ENCOUNTER — Ambulatory Visit (INDEPENDENT_AMBULATORY_CARE_PROVIDER_SITE_OTHER): Payer: Medicare Other | Admitting: Internal Medicine

## 2011-12-15 ENCOUNTER — Encounter: Payer: Self-pay | Admitting: Internal Medicine

## 2011-12-15 VITALS — BP 136/64 | HR 50 | Temp 97.8°F | Resp 16 | Wt 165.2 lb

## 2011-12-15 DIAGNOSIS — K922 Gastrointestinal hemorrhage, unspecified: Secondary | ICD-10-CM

## 2011-12-15 DIAGNOSIS — D62 Acute posthemorrhagic anemia: Secondary | ICD-10-CM

## 2011-12-15 DIAGNOSIS — D649 Anemia, unspecified: Secondary | ICD-10-CM

## 2011-12-15 LAB — CBC WITH DIFFERENTIAL/PLATELET
Basophils Absolute: 0 10*3/uL (ref 0.0–0.1)
Basophils Relative: 1 % (ref 0–1)
Eosinophils Absolute: 0.3 10*3/uL (ref 0.0–0.7)
Eosinophils Relative: 3 % (ref 0–5)
MCH: 29.7 pg (ref 26.0–34.0)
MCHC: 32.9 g/dL (ref 30.0–36.0)
MCV: 90.4 fL (ref 78.0–100.0)
Platelets: 263 10*3/uL (ref 150–400)
RDW: 15.5 % (ref 11.5–15.5)
WBC: 8.2 10*3/uL (ref 4.0–10.5)

## 2011-12-15 NOTE — Telephone Encounter (Signed)
Caller [son] reports that pt did not leave her house and/or remove pajamas 09.30.13, with complaints of shoulder pain bilateral. Also, son reports that [place] to pt's hospital admission[place] had diarrhea with dark, tarry stools. Informed son Karen Kays to be at [place], per [facility], that patient had no complaints at [place] today when being seen for hospital f/u, Denied abdominal pain and/or bleeding; does have CBC lab pending results. Pt never mentioned anything about pain and/or problem with either shoulder; she was given strict instructions that she stated she understood, for there to be No usage of NSAIDS [can use acetaminophen & similar]; if this is not successful at relieving pain, pt needs ROV for A&E. Pt has f/u with Dr. Carlean Purl in GI 10.18.13/SLS

## 2011-12-15 NOTE — Assessment & Plan Note (Signed)
Review discharge instructions including PPI twice a day use for two weeks then daily and avoidance of aspirin and Plavix for two weeks then resumption because of underlying history of CAD. Patient avoiding all anti-inflammatories. Reminded of GI followup appointment scheduled and pending.

## 2011-12-15 NOTE — Patient Instructions (Signed)
Please schedule fasting labs prior to next visit Cbc-anemia, chem7, a1c-250.00 and lipid/lft-272.4

## 2011-12-15 NOTE — Progress Notes (Signed)
  Subjective:    Patient ID: Destiny [name], female    DOB: 15-Jun-1931, 78 y.o.   MRN: [id number]  HPI patient presents to clinic for hospital followup of GI bleed. Has had recurrent GI bleeding and last hospitalization EGD demonstrated duodenal ulcer. Was recommended for a twice a day PPI for two weeks then daily. Avoid all anti-inflammatories and patient denies use. Also was recommended to remain off aspirin and Plavix for two weeks and then resume the medication. Was anemic during hospitalization requiring three unit packed RBC transfusion. Last hemoglobin approximately 8 point eight. Patient specifically denies abdominal pain, hematochezia, or melena. No active complaint today.  Past Medical History  Diagnosis Date  . Undiagnosed cardiac murmurs   . PVD (peripheral vascular disease)   . CAD (coronary artery disease)   . Hypertension   . Hypernatremia   . Hyperlipidemia   . Duodenal ulcer 2009 and 11/2011  . Hyperkalemia   . Achlorhydria   . Iron deficiency anemia secondary to blood loss (chronic)   . Amaurosis fugax   . Carotid bruit   . Diabetes mellitus type II   . Dementia     pt denies  . Diabetic retinopathy   . Gallstones 2009    seen on CT scan  . Diverticulosis   . Atherosclerosis   . Renal insufficiency   . CVA (cerebral infarction)   . Diabetic peripheral neuropathy   . GI bleed 04/2011, 11/2011    Cause not defined.    Past Surgical History  Procedure Date  . Coronary artery bypass graft 1999    x 4  . Abdominal hysterectomy   . Upper gastrointestinal endoscopy 02/22/2008    w/biopsy, duedenal ulcer, antrum erosions  . Esophagogastroduodenoscopy 05/16/2011    Procedure: ESOPHAGOGASTRODUODENOSCOPY (EGD);  Surgeon: Gatha Mayer, MD;  Location[facility] ENDOSCOPY;  Service: Endoscopy;  Laterality: N/A;  . Colonoscopy 05/20/2011    Procedure: COLONOSCOPY;  Surgeon: Scarlette Shorts, MD;  Location: [facility];  Service: Endoscopy;  Laterality: N/A;  . Esophagogastroduodenoscopy  12/08/2011    Procedure: ESOPHAGOGASTRODUODENOSCOPY (EGD);  Surgeon: Ladene Artist, MD,FACG;  Location: [facility] ENDOSCOPY;  Service: Endoscopy;  Laterality: N/A;    reports that she quit smoking about 10 years ago. Her smoking use included Cigarettes. She has never used smokeless tobacco. She reports that she does not drink alcohol or use illicit drugs. family history includes Cancer in her mother; Coronary artery disease in her father; Diabetes in her maternal uncle; Hypertension in her father; and Stroke in her father. No Known Allergies   Review of Systems see hpi     Objective:   Physical Exam  Nursing note and vitals reviewed. Constitutional: She appears well-developed and well-nourished. No distress.  HENT:  Head: Normocephalic and atraumatic.  Eyes: Conjunctivae normal are normal. No scleral icterus.  Cardiovascular: Normal rate, regular rhythm and normal heart sounds.   Pulmonary/Chest: Effort normal and breath sounds normal. No respiratory distress. She has no wheezes. She has no rales.  Abdominal: She exhibits no distension. There is no tenderness. There is no guarding.  Neurological: She is alert.  Skin: Skin is warm and dry. She is not diaphoretic.  Psychiatric: She has a normal mood and affect.          Assessment & Plan:

## 2011-12-15 NOTE — Assessment & Plan Note (Signed)
Denies further evidence of blood loss. Obtain CBC

## 2011-12-25 ENCOUNTER — Telehealth: Payer: Self-pay | Admitting: Cardiovascular Disease

## 2011-12-25 NOTE — Telephone Encounter (Signed)
Pt calls today because she had chest pain at rest yesterday evening.   Her chest pain was "all across my chest". Relieved by 3 NTG. She had been preparing for her granddaughter's birthday "working hard to get ready". Pt denies any chest pain/discomfort or shortness of breath today. Pt was last seen 10/2009 by Dr. Johnsie Cancel. Appointment made with [place] PA  for 12/30/11. Horton Chin RN

## 2011-12-25 NOTE — Telephone Encounter (Signed)
Pt was having chest pain last night and she took 3 nitro to ease the pain and she is not having the pain right now

## 2011-12-30 ENCOUNTER — Encounter: Payer: Self-pay | Admitting: Physician Assistant

## 2011-12-30 ENCOUNTER — Ambulatory Visit (INDEPENDENT_AMBULATORY_CARE_PROVIDER_SITE_OTHER): Payer: Medicare Other | Admitting: Physician Assistant

## 2011-12-30 VITALS — BP 128/56 | HR 62 | Ht 66.0 in | Wt 163.0 lb

## 2011-12-30 DIAGNOSIS — I2510 Atherosclerotic heart disease of native coronary artery without angina pectoris: Secondary | ICD-10-CM

## 2011-12-30 DIAGNOSIS — R0989 Other specified symptoms and signs involving the circulatory and respiratory systems: Secondary | ICD-10-CM

## 2011-12-30 DIAGNOSIS — I739 Peripheral vascular disease, unspecified: Secondary | ICD-10-CM

## 2011-12-30 DIAGNOSIS — I10 Essential (primary) hypertension: Secondary | ICD-10-CM

## 2011-12-30 DIAGNOSIS — E7800 Pure hypercholesterolemia, unspecified: Secondary | ICD-10-CM

## 2011-12-30 DIAGNOSIS — R079 Chest pain, unspecified: Secondary | ICD-10-CM

## 2011-12-30 NOTE — Assessment & Plan Note (Signed)
Patient has history of bilateral carotid stenosis of 40-59%. We will check carotid Dopplers to followup.

## 2011-12-30 NOTE — Patient Instructions (Addendum)
Your physician recommends that you schedule a follow-up appointment in: 2-3 weeks with Dr Johnsie Cancel   Your physician has requested that you have en exercise stress myoview. For further information please visit [email or web address]. Please follow instruction sheet, as given.  Your physician has requested that you have a carotid duplex. This test is an ultrasound of the carotid arteries in your neck. It looks at blood flow through these arteries that supply the brain with blood. Allow one hour for this exam. There are no restrictions or special instructions.     Your physician recommends that you return for lab work in: day of stress test---FASTING lipid/liver

## 2011-12-30 NOTE — Assessment & Plan Note (Signed)
Blood pressure stable ? ?

## 2011-12-30 NOTE — Assessment & Plan Note (Signed)
Patient has history of coronary artery disease status post CABG x4 1999. She had an abnormal stress Myoview leading to cardiac catheterization in 10/10 which showed 3 of 4 grafts patent. She was treated medically because of her prior history of CVAs. She has had 2 weeks of off-and-on chest pain that is somewhat atypical and may be related to her recent GI bleed. Because of her history of coronary artery disease and recent chest pain we'll check a stress Myoview and have her follow up with Dr. Johnsie Cancel.

## 2011-12-30 NOTE — Progress Notes (Signed)
HPI:  This is a 78 year old white female patient of Dr. Jenkins Rouge Who hasn't been seen here in 2 years. She has a history of coronary artery disease status post CABG In 1999.Cardiac catheterization in 10/10 showed 3 or 4 patent vein grafts and normal LV function. This was after her a stress Myoview showed significant anterior apical ischemia. Because of her prior CVAs and lack of chest pain she was treated medical therapy was recommended. She also has diffuse peripheral vascular disease with tight distal right superficial femoral artery and tight left popliteal. She was seen by Dr. Burt Knack in 2009 and had ABIs in the 0.4 range bilaterally. She also has 40-59% carotid disease needs follow-up duplex. The patient had a recent hospitalization for a GI bleed. Her hemoglobin got down to 7.6. For 2 weeks after she went home from the hospital she complained of chest pain off-and-on that would last approximately 1 minute or so. It always occurred at rest. She describes the pain as a discomfort across her chest that radiated to her left shoulder. She denied any associated tightness or pressure, diaphoresis, dizziness or presyncope. Her daughter-in-law who is a pharmacist was with her and gave her 3 nitroglycerin and antacids. She felt like the third nitroglycerin helped relieve the pain. Since she made this appointment she is not had any more chest pain. She is very inactive. She tried sweeping the floor this morning and says she just gives out. She denies exertional chest pain.  No Known Allergies  Current Outpatient Prescriptions on File Prior to Visit:significant anterior apical ischemia. Because of her prior CVAs and lack of chest pain she was treated medically. amLODipine (NORVASC) 10 MG tablet, Take 10 mg by mouth daily., Disp: , Rfl:   Cholecalciferol (VITAMIN D) 1000 UNITS capsule, Take 1,000 Units by mouth daily. , Disp: , Rfl:  colesevelam (WELCHOL) 625 MG tablet, Take 1,875 mg by mouth daily., Disp: ,  Rfl:  donepezil (ARICEPT) 10 MG tablet, Take 10 mg by mouth daily., Disp: , Rfl:  Ferrous Sulfate (IRON) 325 (65 FE) MG TABS, Take 325 mg by mouth 2 (two) times daily. , Disp: , Rfl:  furosemide (LASIX) 20 MG tablet, Take 1 tablet (20 mg total) by mouth daily., Disp: 30 tablet, Rfl: 0 Glucosamine-Chondroit-Vit C-Mn (GLUCOSAMINE CHONDR 500 COMPLEX) CAPS, Take 1 capsule by mouth daily. , Disp: , Rfl:  insulin glargine (LANTUS) 100 UNIT/ML injection, Inject 40 Units into the skin at bedtime., Disp: , Rfl:  insulin lispro (HUMALOG) 100 UNIT/ML injection, Inject 5-15 Units into the skin 3 (three) times daily before meals., Disp: , Rfl:  isosorbide mononitrate (IMDUR) 30 MG 24 hr tablet, Take 30 mg by mouth daily., Disp: , Rfl:  memantine (NAMENDA) 10 MG tablet, Take 10 mg by mouth 2 (two) times daily., Disp: , Rfl:  metoprolol succinate (TOPROL-XL) 25 MG 24 hr tablet, Take 25 mg by mouth daily., Disp: , Rfl:  Multiple Vitamins-Minerals (CENTRUM SILVER PO), Take 1 tablet by mouth daily. , Disp: , Rfl:  nitroGLYCERIN (NITROSTAT) 0.4 MG SL tablet, Place 0.4 mg under the tongue every 5 (five) minutes as needed. Chest pain, Disp: , Rfl:  pantoprazole (PROTONIX) 40 MG tablet, Take 1 tablet (40 mg total) by mouth 2 (two) times daily., Disp: 60 tablet, Rfl: 0 quinapril (ACCUPRIL) 40 MG tablet, Take 0.5 tablets (20 mg total) by mouth at bedtime., Disp: 30 tablet, Rfl: 0 rosuvastatin (CRESTOR) 20 MG tablet, Take 20 mg by mouth daily., Disp: , Rfl:  vitamin B-12 (CYANOCOBALAMIN)  1000 MCG tablet, Take 1,000 mcg by mouth daily., Disp: , Rfl:  vitamin E 400 UNIT capsule, Take 400 Units by mouth daily., Disp: , Rfl:  Omega-3 Fatty Acids (FISH OIL) 1200 MG CAPS, Take 1 capsule by mouth daily., Disp: , Rfl:     Past Medical History:   Undiagnosed cardiac murmurs                                  PVD (peripheral vascular disease)                            CAD (coronary artery disease)                                 Hypertension                                                 Hypernatremia                                                Hyperlipidemia                                               Duodenal ulcer                                  2009 and *   Hyperkalemia                                                 Achlorhydria                                                 Iron deficiency anemia secondary to blood loss*              Amaurosis fugax                                              Carotid bruit                                                Diabetes mellitus type II                                    Dementia  Comment:pt denies   Diabetic retinopathy(362.0)                                  Gallstones                                      2009           Comment:seen on CT scan   Diverticulosis                                               Atherosclerosis                                              Renal insufficiency                                          CVA (cerebral infarction)                                    Diabetic peripheral neuropathy                               GI bleed                                        04/2011, 9*     Comment:Cause not defined.   Past Surgical History:   CORONARY ARTERY BYPASS GRAFT                    1999           Comment:x 4   ABDOMINAL HYSTERECTOMY                                       UPPER GASTROINTESTINAL ENDOSCOPY                02/22/2008      Comment:w/biopsy, duedenal ulcer, antrum erosions   ESOPHAGOGASTRODUODENOSCOPY                      05/16/2011       Comment:Procedure: ESOPHAGOGASTRODUODENOSCOPY (EGD);                Surgeon: Gatha Mayer, MD;  Location: [facility]               ENDOSCOPY;  Service: Endoscopy;  Laterality:               N/A;   COLONOSCOPY                                     05/20/2011  Comment:Procedure: COLONOSCOPY;  Surgeon: Scarlette Shorts,               MD;   Location: [facility];  Service:               Endoscopy;  Laterality: N/A;   ESOPHAGOGASTRODUODENOSCOPY                      12/08/2011      Comment:Procedure: ESOPHAGOGASTRODUODENOSCOPY (EGD);                Surgeon: Ladene Artist, MD,FACG;  Location:               [facility] ENDOSCOPY;  Service: Endoscopy;  Laterality:              N/A;  Review of patient's family history indicates:   Coronary artery disease        Father                   Hypertension                   Father                   Stroke                         Father                   Cancer                         Mother                     Comment: ?   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                                   Social History Main Topics   Smoking Status: Former Smoker                   Packs/Day:       Years:           Types: Cigarettes     Quit date: 12/31/2000   Smokeless Status: Never Used                       Alcohol Use: No             Drug Use: No  Sexual Activity: Not on file        Other Topics            Concern   None on file  Social History Narrative   Widowed - husband passed away in 05/27/2009   lives with daughter with epilepsy - mental retardation   Retired     Former Smoker      Alcohol use-no          Occupation: Retired       son - [employer]     [id number] denies claudication symptoms.review of systems negative except for what is stated in history of present illness.   PHYSICAL EXAM: Well-nournished, in no acute distress. Neck: bilateral carotid bruits,No JVD, HJR, or thyroid enlargement  Lungs: Decreased breath sounds throughout, No tachypnea, clear without wheezing, rales, or  rhonchi  Cardiovascular: RRR, PMI not displaced, positive S4 and 2/6 systolic murmur at the left border, no bruit, thrill, or heave.  Abdomen: BS normal. Soft without organomegaly, masses, lesions or tenderness.  Extremities: without cyanosis, clubbing or edema. Decreased distal pulses bilateral  SKin: Warm, no lesions or rashes   Musculoskeletal: No deformities  Neuro: no focal signs  BP 128/56  Pulse 62  Ht 5\' 6"  (1.676 m)  Wt 163 lb (73.936 kg)  BMI 26.31 kg/m2   [id number] sinus rhythm nonspecific ST-T wave changes, no acute change   Yekaterina is a vasculopath with distant history of CABG and known PVD.  She was cathed in 10/10 and then F/U with the PA.  I  last saw her in [place] .  Cath showed 3/4 patent vein grafts and normal LV funciton.  Medical Rx recommended by Dr Dinah Beers.  She had diffuse PVD with a tight distal RSFA and tight left popliteal.  Most recent ABI;s were in the .6 range.  she saw Dr Burt Knack in 2009 and had ABI's in the .4 range bilaterally.  She also has bilateral 40-59% carotid disease and needs F/U duplex this year.  She denies SSCP but does have somewhat limiting claudication and cramps at night that she says mustard helps.  She has been started on insulin by Dr Shawna Orleans.  She had a myovue study that I reviewed It showed fairly significant anteroapical ischemia.  I then reviewed her cath from 10/10.  Her lima was patent to the LAD and I did not see a lot of retrograde filling of the diagonal whose SVG had been occluded.  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  I questioned her closley and she is not having SSCP, pressure or marked dyspnea.  Her activity is more limited by claudication.  After lengthy discussion we decided to continue medical Rx and Imdur was added during our last visit .  She has not had any SSCP with medical Rx and continues to want to avoid further cath. She has stable claudication and no TIA's

## 2011-12-30 NOTE — Assessment & Plan Note (Signed)
Patient has significant vascular disease but denies claudication symptoms.

## 2012-01-01 ENCOUNTER — Ambulatory Visit: Payer: Medicare Other | Admitting: Internal Medicine

## 2012-01-06 ENCOUNTER — Ambulatory Visit (HOSPITAL_COMMUNITY): Payer: Medicare Other | Attending: Cardiovascular Disease | Admitting: Radiology

## 2012-01-06 ENCOUNTER — Other Ambulatory Visit (INDEPENDENT_AMBULATORY_CARE_PROVIDER_SITE_OTHER): Payer: Medicare Other

## 2012-01-06 VITALS — BP 148/59 | Ht 66.0 in | Wt 165.0 lb

## 2012-01-06 DIAGNOSIS — R9431 Abnormal electrocardiogram [ECG] [EKG]: Secondary | ICD-10-CM | POA: Insufficient documentation

## 2012-01-06 DIAGNOSIS — R0989 Other specified symptoms and signs involving the circulatory and respiratory systems: Secondary | ICD-10-CM | POA: Insufficient documentation

## 2012-01-06 DIAGNOSIS — R079 Chest pain, unspecified: Secondary | ICD-10-CM | POA: Insufficient documentation

## 2012-01-06 DIAGNOSIS — I779 Disorder of arteries and arterioles, unspecified: Secondary | ICD-10-CM | POA: Insufficient documentation

## 2012-01-06 DIAGNOSIS — R5381 Other malaise: Secondary | ICD-10-CM | POA: Insufficient documentation

## 2012-01-06 DIAGNOSIS — E7800 Pure hypercholesterolemia, unspecified: Secondary | ICD-10-CM

## 2012-01-06 DIAGNOSIS — Z794 Long term (current) use of insulin: Secondary | ICD-10-CM | POA: Insufficient documentation

## 2012-01-06 DIAGNOSIS — Z8249 Family history of ischemic heart disease and other diseases of the circulatory system: Secondary | ICD-10-CM | POA: Insufficient documentation

## 2012-01-06 DIAGNOSIS — R0609 Other forms of dyspnea: Secondary | ICD-10-CM | POA: Insufficient documentation

## 2012-01-06 DIAGNOSIS — Z951 Presence of aortocoronary bypass graft: Secondary | ICD-10-CM | POA: Insufficient documentation

## 2012-01-06 DIAGNOSIS — Z87891 Personal history of nicotine dependence: Secondary | ICD-10-CM | POA: Insufficient documentation

## 2012-01-06 DIAGNOSIS — I2510 Atherosclerotic heart disease of native coronary artery without angina pectoris: Secondary | ICD-10-CM

## 2012-01-06 DIAGNOSIS — Z8673 Personal history of transient ischemic attack (TIA), and cerebral infarction without residual deficits: Secondary | ICD-10-CM | POA: Insufficient documentation

## 2012-01-06 DIAGNOSIS — I10 Essential (primary) hypertension: Secondary | ICD-10-CM | POA: Insufficient documentation

## 2012-01-06 DIAGNOSIS — I739 Peripheral vascular disease, unspecified: Secondary | ICD-10-CM | POA: Insufficient documentation

## 2012-01-06 DIAGNOSIS — E785 Hyperlipidemia, unspecified: Secondary | ICD-10-CM | POA: Insufficient documentation

## 2012-01-06 DIAGNOSIS — I4510 Unspecified right bundle-branch block: Secondary | ICD-10-CM | POA: Insufficient documentation

## 2012-01-06 DIAGNOSIS — E119 Type 2 diabetes mellitus without complications: Secondary | ICD-10-CM

## 2012-01-06 DIAGNOSIS — R0602 Shortness of breath: Secondary | ICD-10-CM | POA: Insufficient documentation

## 2012-01-06 LAB — HEPATIC FUNCTION PANEL
ALT: 17 U/L (ref 0–35)
AST: 20 U/L (ref 0–37)
Albumin: 3.5 g/dL (ref 3.5–5.2)

## 2012-01-06 LAB — LIPID PANEL
HDL: 49.2 mg/dL (ref >39.00)
Triglycerides: 198 mg/dL — ABNORMAL HIGH (ref 0.0–149.0)

## 2012-01-06 MED ORDER — TECHNETIUM TC 99M SESTAMIBI GENERIC - CARDIOLITE
30.0000 | Freq: Once | INTRAVENOUS | Status: AC | PRN
  Administered 2012-01-06: 30 via INTRAVENOUS

## 2012-01-06 MED ORDER — TECHNETIUM TC 99M SESTAMIBI GENERIC - CARDIOLITE
10.0000 | Freq: Once | INTRAVENOUS | Status: AC | PRN
  Administered 2012-01-06: 10 via INTRAVENOUS

## 2012-01-06 MED ORDER — REGADENOSON 0.4 MG/5ML IV SOLN
0.4000 mg | Freq: Once | INTRAVENOUS | Status: AC
  Administered 2012-01-06: 0.4 mg via INTRAVENOUS

## 2012-01-06 NOTE — Progress Notes (Signed)
[facility] [id number] [place] [address] [phone]  Cardiology Nuclear Med Study  Destiny [name] is a 78 y.o. female     MRN : [id number]     DOB: February 09, 1932  Procedure Date: 01/06/2012  Nuclear Med Background Indication for Stress Test:  Evaluation for Ischemia and Graft Patency History:  1999: CABG x4, Abnormal EKG, 2009 ECHO: EF: 55-60%,10/10 Pseudoaneurysm EF: 60% 3 or 4 patent grafts, SVG by occluded LIMA to LAD, 07/30/09 MPS: mod size reversible apical anterior lateral perfusion defect suggestive ischemia cont mediically Tx   Cardiac Risk Factors: Carotid Disease, Claudication, CVA, Family History - CAD, History of Smoking, Hypertension, IDDM Type 2, Lipids, PVD, RBBB and TIA  Symptoms:  Chest Pain, DOE, Fatigue and SOB   Nuclear Pre-Procedure Caffeine/Decaff Intake:  None NPO After: 7:00pm   Lungs:  clear O2 Sat: 95% on room air. IV 0.9% NS with Angio Cath:  22g  IV Site: Right Bicep IV Started by:  Eliezer Lofts, EMT-P  Chest Size (in):  40 Cup Size: B  Height: 5\' 6"  (1.676 m)  Weight:  165 lb (74.844 kg)  BMI:  Body mass index is 26.63 kg/(m^2). Tech Comments:  CBG= 198 @ 8:30am, per patient.    Nuclear Med Study 1 or 2 day study: 1 day  Stress Test Type:  Carlton Adam  Reading MD: Jenkins Rouge, MD  Order Authorizing Provider:  M.Cooper MD  Resting Radionuclide: Technetium 45m Sestamibi  Resting Radionuclide Dose: 11.0 mCi   Stress Radionuclide:  Technetium 50m Sestamibi  Stress Radionuclide Dose: 33.0 mCi           Stress Protocol Rest HR: 49 Stress HR: 61  Rest BP: 148/59 Stress BP: 165/53  Exercise Time (min): n/a METS: n/a   Predicted Max HR: 142 bpm % Max HR: 42.96 bpm Rate Pressure Product: 10065   Dose of Adenosine (mg):  n/a Dose of Lexiscan: 0.4 mg  Dose of Atropine (mg): n/a Dose of Dobutamine: n/a mcg/kg/min (at max HR)  Stress Test Technologist: Perrin Maltese, EMT-P  Nuclear Technologist:   Charlton Amor, CNMT     Rest Procedure:  Myocardial perfusion imaging was performed at rest 45 minutes following the intravenous administration of Technetium 94m Sestamibi. Rest ECG: Sinus Bradycardia  Stress Procedure:  The patient received IV Lexiscan 0.4 mg over 15-seconds.  Technetium 70m Sestamibi injected at 30-seconds.  There were no significant changes and sob with Lexiscan.  Quantitative spect images were obtained after a 45 minute delay. Stress ECG: No significant change from baseline ECG  QPS Raw Data Images:  Normal; no motion artifact; normal heart/lung ratio. Stress Images:  There is decreased uptake in the anterior wall. Rest Images:  Normal homogeneous uptake in all areas of the myocardium. Subtraction (SDS):  These findings are consistent with ischemia. Transient Ischemic Dilatation (Normal <1.22):  1.05 Lung/Heart Ratio (Normal <0.45):  0.25  Quantitative Gated Spect Images QGS EDV:  93 ml QGS ESV:  26 ml  Impression Exercise Capacity:  Lexiscan with no exercise. BP Response:  Normal blood pressure response. Clinical Symptoms:  There is dyspnea. ECG Impression:  No significant ST segment change suggestive of ischemia. Comparison with Prior Nuclear Study: No images to compare  Overall Impression:  Small area of mild mid and apical anterior wall ischemia  LV Ejection Fraction: 72%.  LV Wall Motion:  NL LV Function; NL Wall Motion   Jenkins Rouge

## 2012-01-11 ENCOUNTER — Encounter (INDEPENDENT_AMBULATORY_CARE_PROVIDER_SITE_OTHER): Payer: Medicare Other

## 2012-01-11 DIAGNOSIS — R0989 Other specified symptoms and signs involving the circulatory and respiratory systems: Secondary | ICD-10-CM

## 2012-01-11 DIAGNOSIS — I6529 Occlusion and stenosis of unspecified carotid artery: Secondary | ICD-10-CM

## 2012-01-18 ENCOUNTER — Other Ambulatory Visit: Payer: Self-pay

## 2012-01-18 DIAGNOSIS — R0989 Other specified symptoms and signs involving the circulatory and respiratory systems: Secondary | ICD-10-CM

## 2012-01-27 ENCOUNTER — Encounter: Payer: Medicare Other | Admitting: Cardiovascular Disease

## 2012-01-27 NOTE — Progress Notes (Signed)
Patient ID: Destiny [name], female   DOB: 1931/08/03, 78 y.o.   MRN: [id number] No show

## 2012-02-03 ENCOUNTER — Encounter: Payer: Self-pay | Admitting: Gastroenterology

## 2012-02-03 ENCOUNTER — Ambulatory Visit (INDEPENDENT_AMBULATORY_CARE_PROVIDER_SITE_OTHER): Payer: Medicare Other | Admitting: Gastroenterology

## 2012-02-03 VITALS — BP 160/60 | HR 60 | Ht 65.25 in | Wt 165.0 lb

## 2012-02-03 DIAGNOSIS — K922 Gastrointestinal hemorrhage, unspecified: Secondary | ICD-10-CM

## 2012-02-03 NOTE — Progress Notes (Signed)
History of Present Illness:  This is a post hospitalization visit for Destiny [name], a 78 year old white female admitted in September, 2013 for acute GI bleeding. She's had recurrent bleeding for which she is undergone workup including colonoscopy, upper endoscopy and capsule endoscopy.  Last endoscopy demonstrated a nonbleeding duodenal ulcer. Capsule endoscopy was negative. Colonoscopy demonstrated diverticulosis only. Since discharge she's had no further GI problems including melena or hematochezia. Plavix was restarted.    Past Medical History  Diagnosis Date  . Undiagnosed cardiac murmurs   . PVD (peripheral vascular disease)   . CAD (coronary artery disease)   . Hypertension   . Hypernatremia   . Hyperlipidemia   . Duodenal ulcer 2009 and 11/2011  . Hyperkalemia   . Achlorhydria   . Iron deficiency anemia secondary to blood loss (chronic)   . Amaurosis fugax   . Carotid bruit   . Diabetes mellitus type II   . Dementia     pt denies  . Diabetic retinopathy(362.0)   . Gallstones 2009    seen on CT scan  . Diverticulosis   . Atherosclerosis   . Renal insufficiency   . CVA (cerebral infarction)   . Diabetic peripheral neuropathy   . GI bleed 04/2011, 11/2011    Cause not defined.   . Colon polyp    Past Surgical History  Procedure Date  . Coronary artery bypass graft 1999    x 4  . Abdominal hysterectomy   . Upper gastrointestinal endoscopy 02/22/2008    w/biopsy, duedenal ulcer, antrum erosions  . Esophagogastroduodenoscopy 05/16/2011    Procedure: ESOPHAGOGASTRODUODENOSCOPY (EGD);  Surgeon: Gatha Mayer, MD;  Location: [facility] ENDOSCOPY;  Service: Endoscopy;  Laterality: N/A;  . Colonoscopy 05/20/2011    Procedure: COLONOSCOPY;  Surgeon: Scarlette Shorts, MD;  Location: [place];  Service: Endoscopy;  Laterality: N/A;  . Esophagogastroduodenoscopy 12/08/2011    Procedure: ESOPHAGOGASTRODUODENOSCOPY (EGD);  Surgeon: Ladene Artist, MD,FACG;  Location: [facility] ENDOSCOPY;  Service:  Endoscopy;  Laterality: N/A;   family history includes Cancer in her mother; Coronary artery disease in her father; Diabetes in her maternal uncle; Hypertension in her father; and Stroke in her father.  There is no history of Colon cancer. Current Outpatient Prescriptions  Medication Sig Dispense Refill  . amLODipine (NORVASC) 10 MG tablet Take 10 mg by mouth daily.      Marland Kitchen aspirin 81 MG tablet Take 81 mg by mouth. 2 with Am meds and 2 with lunch      . Cholecalciferol (VITAMIN D) 1000 UNITS capsule Take 1,000 Units by mouth daily.       . clopidogrel (PLAVIX) 75 MG tablet Take 75 mg by mouth daily.       . colesevelam (WELCHOL) 625 MG tablet Take 1,875 mg by mouth daily.      Marland Kitchen donepezil (ARICEPT) 10 MG tablet Take 10 mg by mouth daily.      . Ferrous Sulfate (IRON) 325 (65 FE) MG TABS Take 325 mg by mouth 2 (two) times daily.       . furosemide (LASIX) 20 MG tablet Take 1 tablet (20 mg total) by mouth daily.  30 tablet  0  . Glucosamine-Chondroit-Vit C-Mn (GLUCOSAMINE CHONDR 500 COMPLEX) CAPS Take 1 capsule by mouth daily.       . insulin glargine (LANTUS) 100 UNIT/ML injection Inject 40 Units into the skin at bedtime. Pt uses sliding scale      . insulin lispro (HUMALOG) 100 UNIT/ML injection Inject 5-15 Units into the  skin 3 (three) times daily before meals.      . isosorbide mononitrate (IMDUR) 30 MG 24 hr tablet Take 30 mg by mouth daily.      . memantine (NAMENDA) 10 MG tablet Take 10 mg by mouth 2 (two) times daily.      . metoprolol succinate (TOPROL-XL) 25 MG 24 hr tablet Take 25 mg by mouth daily.      . Multiple Vitamins-Minerals (CENTRUM SILVER PO) Take 1 tablet by mouth daily.       . nitroGLYCERIN (NITROSTAT) 0.4 MG SL tablet Place 0.4 mg under the tongue every 5 (five) minutes as needed. Chest pain      . Omega-3 Fatty Acids (FISH OIL) 1200 MG CAPS Take 1 capsule by mouth daily.      . pantoprazole (PROTONIX) 40 MG tablet Take 1 tablet (40 mg total) by mouth 2 (two) times daily.   60 tablet  0  . quinapril (ACCUPRIL) 40 MG tablet Take 0.5 tablets (20 mg total) by mouth at bedtime.  30 tablet  0  . rosuvastatin (CRESTOR) 20 MG tablet Take 20 mg by mouth daily.      . vitamin B-12 (CYANOCOBALAMIN) 1000 MCG tablet Take 1,000 mcg by mouth daily.      . vitamin E 400 UNIT capsule Take 400 Units by mouth daily.       Allergies as of 02/03/2012  . (No Known Allergies)    reports that she quit smoking about 11 years ago. Her smoking use included Cigarettes. She has never used smokeless tobacco. She reports that she does not drink alcohol or use illicit drugs.     Review of Systems: Pertinent positive and negative review of systems were noted in the above HPI section. All other review of systems were otherwise negative.  Vital signs were reviewed in today's medical record Physical Exam: General: Well developed , well nourished, no acute distress Head: Normocephalic and atraumatic Eyes:  sclerae anicteric, EOMI Ears: Normal auditory acuity Mouth: No deformity or lesions Neck: Supple, no masses or thyromegaly Lungs: Clear throughout to auscultation Heart: Regular rate and rhythm; no murmurs, rubs or bruits Abdomen: Soft, non tender and non distended. No masses, hepatosplenomegaly or hernias noted. Normal Bowel sounds Rectal:deferred Musculoskeletal: Symmetrical with no gross deformities  Skin: No lesions on visible extremities Pulses:  Normal pulses noted Extremities: No clubbing, cyanosis, edema or deformities noted Neurological: Alert oriented x 4, grossly nonfocal Cervical Nodes:  No significant cervical adenopathy Inguinal Nodes: No significant inguinal adenopathy Psychological:  Alert and cooperative. Normal mood and affect

## 2012-02-03 NOTE — Assessment & Plan Note (Signed)
Recurrent GI bleeding, presumably secondary to peptic ulcer disease. Patient tested negative for H. pylori antibody.  Recommendations #1 avoid NSAIDs #2 continue Protonix  Followup with Dr. Carlean Purl as needed for GI bleeding

## 2012-02-03 NOTE — Patient Instructions (Addendum)
Please follow-up with Dr. Silvano Rusk as needed.

## 2012-02-05 ENCOUNTER — Other Ambulatory Visit: Payer: Self-pay | Admitting: Internal Medicine

## 2012-02-05 NOTE — Telephone Encounter (Signed)
Please advise re: Plavix request. Note was made on med list dated 12/10/11 that pt was supposed to stop medication at discharge and resume in 2 weeks. Is it ok to refill?

## 2012-02-05 NOTE — Telephone Encounter (Signed)
Refills sent

## 2012-02-05 NOTE — Telephone Encounter (Signed)
Ok to fill 

## 2012-02-24 ENCOUNTER — Other Ambulatory Visit: Payer: Self-pay | Admitting: Internal Medicine

## 2012-02-24 NOTE — Telephone Encounter (Signed)
Spoke to pharmacy Rx on file can disregard request.

## 2012-02-26 ENCOUNTER — Telehealth: Payer: Self-pay | Admitting: Internal Medicine

## 2012-02-26 ENCOUNTER — Other Ambulatory Visit (INDEPENDENT_AMBULATORY_CARE_PROVIDER_SITE_OTHER): Payer: Medicare Other

## 2012-02-26 DIAGNOSIS — D649 Anemia, unspecified: Secondary | ICD-10-CM

## 2012-02-26 DIAGNOSIS — E785 Hyperlipidemia, unspecified: Secondary | ICD-10-CM

## 2012-02-26 DIAGNOSIS — E119 Type 2 diabetes mellitus without complications: Secondary | ICD-10-CM

## 2012-02-26 DIAGNOSIS — Z79899 Other long term (current) drug therapy: Secondary | ICD-10-CM

## 2012-02-26 LAB — HEPATIC FUNCTION PANEL
AST: 17 U/L (ref 0–37)
Alkaline Phosphatase: 74 U/L (ref 39–117)
Total Bilirubin: 1 mg/dL (ref 0.3–1.2)

## 2012-02-26 LAB — CBC WITH DIFFERENTIAL/PLATELET
Basophils Absolute: 0.1 10*3/uL (ref 0.0–0.1)
Eosinophils Relative: 4.8 % (ref 0.0–5.0)
Lymphs Abs: 1.9 10*3/uL (ref 0.7–4.0)
Monocytes Relative: 8.4 % (ref 3.0–12.0)
Neutrophils Relative %: 59.9 % (ref 43.0–77.0)
Platelets: 202 10*3/uL (ref 150.0–400.0)
RDW: 14.6 % (ref 11.5–14.6)
WBC: 7.1 10*3/uL (ref 4.5–10.5)

## 2012-02-26 LAB — BASIC METABOLIC PANEL
CO2: 21 mEq/L (ref 19–32)
Calcium: 9.6 mg/dL (ref 8.4–10.5)
GFR: 45.64 mL/min — ABNORMAL LOW (ref >60.00)
Glucose, Bld: 213 mg/dL — ABNORMAL HIGH (ref 70–99)
Potassium: 4 mEq/L (ref 3.5–5.1)
Sodium: 138 mEq/L (ref 135–145)

## 2012-02-26 LAB — LIPID PANEL: Total CHOL/HDL Ratio: 4

## 2012-02-26 NOTE — Telephone Encounter (Signed)
Patient is at [facility] lab  They need an order for Cbc-anemia, chem7, a1c-250.00 and lipid/lft-272.4

## 2012-02-26 NOTE — Telephone Encounter (Signed)
Lab orders placed/SLS

## 2012-03-01 ENCOUNTER — Ambulatory Visit (INDEPENDENT_AMBULATORY_CARE_PROVIDER_SITE_OTHER): Payer: Medicare Other | Admitting: Internal Medicine

## 2012-03-01 ENCOUNTER — Encounter: Payer: Self-pay | Admitting: Internal Medicine

## 2012-03-01 VITALS — BP 138/68 | HR 46 | Temp 97.6°F | Resp 16 | Wt 166.8 lb

## 2012-03-01 DIAGNOSIS — R0781 Pleurodynia: Secondary | ICD-10-CM | POA: Insufficient documentation

## 2012-03-01 DIAGNOSIS — R079 Chest pain, unspecified: Secondary | ICD-10-CM

## 2012-03-01 DIAGNOSIS — Z794 Long term (current) use of insulin: Secondary | ICD-10-CM

## 2012-03-01 DIAGNOSIS — E119 Type 2 diabetes mellitus without complications: Secondary | ICD-10-CM

## 2012-03-01 DIAGNOSIS — R0789 Other chest pain: Secondary | ICD-10-CM | POA: Insufficient documentation

## 2012-03-01 DIAGNOSIS — D649 Anemia, unspecified: Secondary | ICD-10-CM

## 2012-03-01 NOTE — Progress Notes (Signed)
  Subjective:    Patient ID: Destiny [name], female    DOB: Mar 25, 1931, 78 y.o.   MRN: [id number]  HPI Pt presents to clinic for followup of multiple medical problems. Suffered recent fall ~2wks ago after catching her foot. Did not hit her head but did strike left lower ribs. Pain has improved and takes tylenol only sporadically. H/o GIB and denies any blood in stool. Reviewed now nl hgb. Reviewed a1c remaining elevated and states is eating cookies regularly.   Past Medical History  Diagnosis Date  . Undiagnosed cardiac murmurs   . PVD (peripheral vascular disease)   . CAD (coronary artery disease)   . Hypertension   . Hypernatremia   . Hyperlipidemia   . Duodenal ulcer 2009 and 11/2011  . Hyperkalemia   . Achlorhydria   . Iron deficiency anemia secondary to blood loss (chronic)   . Amaurosis fugax   . Carotid bruit   . Diabetes mellitus type II   . Dementia     pt denies  . Diabetic retinopathy(362.0)   . Gallstones 2009    seen on CT scan  . Diverticulosis   . Atherosclerosis   . Renal insufficiency   . CVA (cerebral infarction)   . Diabetic peripheral neuropathy   . GI bleed 04/2011, 11/2011    Cause not defined.   . Colon polyp    Past Surgical History  Procedure Date  . Coronary artery bypass graft 1999    x 4  . Abdominal hysterectomy   . Upper gastrointestinal endoscopy 02/22/2008    w/biopsy, duedenal ulcer, antrum erosions  . Esophagogastroduodenoscopy 05/16/2011    Procedure: ESOPHAGOGASTRODUODENOSCOPY (EGD);  Surgeon: Gatha Mayer, MD;  Location: [facility] ENDOSCOPY;  Service: Endoscopy;  Laterality: N/A;  . Colonoscopy 05/20/2011    Procedure: COLONOSCOPY;  Surgeon: Scarlette Shorts, MD;  Location: [place];  Service: Endoscopy;  Laterality: N/A;  . Esophagogastroduodenoscopy 12/08/2011    Procedure: ESOPHAGOGASTRODUODENOSCOPY (EGD);  Surgeon: Ladene Artist, MD,FACG;  Location: [facility] ENDOSCOPY;  Service: Endoscopy;  Laterality: N/A;    reports that she quit smoking about 11  years ago. Her smoking use included Cigarettes. She has never used smokeless tobacco. She reports that she does not drink alcohol or use illicit drugs. family history includes Cancer in her mother; Coronary artery disease in her father; Diabetes in her maternal uncle; Hypertension in her father; and Stroke in her father.  There is no history of Colon cancer. No Known Allergies    Review of Systems     Objective:   Physical Exam  Nursing note and vitals reviewed. Constitutional: She appears well-developed and well-nourished. No distress.  HENT:  Head: Normocephalic and atraumatic.  Right Ear: External ear normal.  Left Ear: External ear normal.  Eyes: Conjunctivae normal are normal. No scleral icterus.  Neck: Neck supple. Carotid bruit is not present.  Cardiovascular: Normal rate, regular rhythm and normal heart sounds.  Exam reveals no gallop and no friction rub.   No murmur heard. Pulmonary/Chest: Effort normal and breath sounds normal. No respiratory distress. She has no wheezes. She has no rales.  Musculoskeletal:       Left ant ribs NT without bony abn  Neurological: She is alert.  Skin: Skin is warm and dry. She is not diaphoretic.  Psychiatric: She has a normal mood and affect.          Assessment & Plan:

## 2012-03-01 NOTE — Assessment & Plan Note (Signed)
Improving. Taking only tylenol on occasion. Discussed xray and defers.

## 2012-03-01 NOTE — Assessment & Plan Note (Signed)
a1c target <7. Admits to dietary indiscretion. Continue current regimen and focus on diet change.

## 2012-03-01 NOTE — Patient Instructions (Signed)
Please schedule labs prior to next visit Cbc-anemia and chem7, a1c-250.00

## 2012-03-01 NOTE — Assessment & Plan Note (Signed)
Currently resolved. No recent evidence of bleeding. Maintain cbc surveillance.

## 2012-03-15 ENCOUNTER — Other Ambulatory Visit: Payer: Self-pay | Admitting: Internal Medicine

## 2012-03-15 NOTE — Telephone Encounter (Signed)
Rx to pharmacy/SLS 

## 2012-03-31 ENCOUNTER — Other Ambulatory Visit: Payer: Self-pay | Admitting: Internal Medicine

## 2012-03-31 NOTE — Telephone Encounter (Signed)
Rx to pharmacy/SLS 

## 2012-04-15 ENCOUNTER — Other Ambulatory Visit: Payer: Self-pay | Admitting: Internal Medicine

## 2012-05-02 ENCOUNTER — Other Ambulatory Visit: Payer: Self-pay | Admitting: Internal Medicine

## 2012-05-09 ENCOUNTER — Ambulatory Visit (INDEPENDENT_AMBULATORY_CARE_PROVIDER_SITE_OTHER): Payer: Medicare Other | Admitting: Physician Assistant

## 2012-05-09 ENCOUNTER — Telehealth: Payer: Self-pay | Admitting: Gastroenterology

## 2012-05-09 ENCOUNTER — Encounter: Payer: Self-pay | Admitting: Physician Assistant

## 2012-05-09 ENCOUNTER — Other Ambulatory Visit (INDEPENDENT_AMBULATORY_CARE_PROVIDER_SITE_OTHER): Payer: Medicare Other

## 2012-05-09 VITALS — BP 130/60 | HR 66 | Ht 66.0 in | Wt 165.8 lb

## 2012-05-09 DIAGNOSIS — K921 Melena: Secondary | ICD-10-CM

## 2012-05-09 DIAGNOSIS — R197 Diarrhea, unspecified: Secondary | ICD-10-CM

## 2012-05-09 DIAGNOSIS — R195 Other fecal abnormalities: Secondary | ICD-10-CM

## 2012-05-09 LAB — CBC WITH DIFFERENTIAL/PLATELET
Basophils Relative: 0.5 % (ref 0.0–3.0)
Eosinophils Absolute: 0.2 10*3/uL (ref 0.0–0.7)
Eosinophils Relative: 3.3 % (ref 0.0–5.0)
HCT: 37.4 % (ref 36.0–46.0)
Hemoglobin: 12.8 g/dL (ref 12.0–15.0)
Lymphs Abs: 1.5 10*3/uL (ref 0.7–4.0)
MCHC: 34.3 g/dL (ref 30.0–36.0)
MCV: 87.9 fl (ref 78.0–100.0)
Monocytes Absolute: 0.6 10*3/uL (ref 0.1–1.0)
Neutro Abs: 4.2 10*3/uL (ref 1.4–7.7)
RBC: 4.26 Mil/uL (ref 3.87–5.11)
WBC: 6.6 10*3/uL (ref 4.5–10.5)

## 2012-05-09 LAB — PROTIME-INR: INR: 1.2 ratio — ABNORMAL HIGH (ref 0.8–1.0)

## 2012-05-09 NOTE — Telephone Encounter (Signed)
Patient's son reports that his mother c/o lethargy and black stools that she told him about yesterday.  She will come in and see Amy Esterwood PA at 3:00.  She will have stat labs CBC and PT/INR at 2:00

## 2012-05-09 NOTE — Progress Notes (Signed)
Reviewed and agree with management. Robert D. Kaplan, M.D., FACG  

## 2012-05-09 NOTE — Patient Instructions (Addendum)
Bland diet for the next couple of days. Imodium as needed for diarrhea. You are not bleeding per Amy Esterwood PA-C.

## 2012-05-09 NOTE — Progress Notes (Signed)
Subjective:    Patient ID: Destiny [name], female    DOB: [birth year], 79 y.o.   MRN: [id number]  HPI Brentley is a very nice 79 year old white female known to Dr. Deatra Ina with multiple medical problems including insulin-dependent diabetes, peripheral vascular disease, coronary artery disease status post CABG, hypertension hyperlipidemia, dementia, and history of prior CVA area She is maintained on Plavix and aspirin. She was hospitalized in September of 2013 with a GI bleed and at that time was found to have a large benign appearing duodenal ulcer. H. pylori antibody was negative.  She has been on PPI therapy since. Her hemoglobin was in the 7 range with her bleeding- she did require transfusions. Last hemoglobin checked in December was 12.2 . Patient called earlier today stating that she had had onset of black diarrhea yesterday after church. She says she had eaten a fairly heavy lunch and then actually had an episode of incontinence and noted very dark black diarrhea. She went home says she felt poorly the rest of the day and had several more episodes of very dark blackish liquid diarrhea and was concerned that this was blood . She did not have any associated abdominal pain cramping fever chills nausea or vomiting. Her appetite has been decreased however and she's not eaten today. She had her last episode of diarrhea about 2:30 AM and took some Imodium and has not had another bowel movement since. When asked how she feels she says "lousy" but denies any dizziness, lightheadedness, shortness of breath etc. She denies use of Pepto-Bismol  Stat labs were done today and shows a hemoglobin of 12.8 hematocrit of 37.4 MCV of 87.9,WBC of 6.6 and platelets 171    Review of Systems  Constitutional: Positive for activity change, appetite change and fatigue.  HENT: Negative.   Eyes: Negative.   Respiratory: Negative.   Cardiovascular: Negative.   Gastrointestinal: Positive for diarrhea.  Endocrine:  Negative.   Genitourinary: Negative.   Allergic/Immunologic: Negative.   Neurological: Negative.   Hematological: Negative.   Psychiatric/Behavioral: Negative.    Outpatient Encounter Prescriptions as of 05/09/2012  Medication Sig Dispense Refill  . amLODipine (NORVASC) 10 MG tablet Take 10 mg by mouth daily.      Marland Kitchen aspirin 81 MG tablet Take 81 mg by mouth. 2 with Am meds and 2 with lunch      . Cholecalciferol (VITAMIN D) 1000 UNITS capsule Take 1,000 Units by mouth daily.       . clopidogrel (PLAVIX) 75 MG tablet TAKE 1 TABLET (75 MG TOTAL) BY MOUTH DAILY.  90 tablet  1  . donepezil (ARICEPT) 10 MG tablet TAKE 1 TABLET (10 MG TOTAL) BY MOUTH DAILY.  30 tablet  6  . Ferrous Sulfate (IRON) 325 (65 FE) MG TABS Take 325 mg by mouth 2 (two) times daily.       . furosemide (LASIX) 20 MG tablet TAKE 1 TABLET (20 MG TOTAL) BY MOUTH DAILY.  90 tablet  1  . Glucosamine-Chondroit-Vit C-Mn (GLUCOSAMINE CHONDR 500 COMPLEX) CAPS Take 1 capsule by mouth daily.       . insulin glargine (LANTUS) 100 UNIT/ML injection Inject 40 Units into the skin at bedtime. Pt uses sliding scale      . insulin lispro (HUMALOG KWIKPEN) 100 UNIT/ML injection INJECT 5-15 UNITS INTO THE SKIN [3] THREE TIMES DAILY BEFORE MEALS.  15 mL  3  . isosorbide mononitrate (IMDUR) 30 MG 24 hr tablet TAKE 1 TABLET (30 MG TOTAL) BY MOUTH DAILY.  90 tablet  1  . memantine (NAMENDA) 10 MG tablet Take 10 mg by mouth 2 (two) times daily.      . metoprolol succinate (TOPROL-XL) 25 MG 24 hr tablet Take 25 mg by mouth daily.      . Multiple Vitamins-Minerals (CENTRUM SILVER PO) Take 1 tablet by mouth daily.       . nitroGLYCERIN (NITROSTAT) 0.4 MG SL tablet Place 0.4 mg under the tongue every 5 (five) minutes as needed. Chest pain      . Omega-3 Fatty Acids (FISH OIL) 1200 MG CAPS Take 1 capsule by mouth daily.      . pantoprazole (PROTONIX) 40 MG tablet TAKE 1 TABLET BY MOUTH ONCE DAILY 30 MINUTES BEFORE A MEAL  90 tablet  1  . quinapril  (ACCUPRIL) 40 MG tablet Take 0.5 tablets (20 mg total) by mouth at bedtime.  30 tablet  0  . rosuvastatin (CRESTOR) 20 MG tablet Take 20 mg by mouth daily.      . vitamin B-12 (CYANOCOBALAMIN) 1000 MCG tablet Take 1,000 mcg by mouth daily.      . vitamin E 400 UNIT capsule Take 400 Units by mouth daily.      . WELCHOL 625 MG tablet TAKE 3 TABLETS (1,875 MG TOTAL) BY MOUTH DAILY.  180 tablet  1  . [DISCONTINUED] clopidogrel (PLAVIX) 75 MG tablet Take 75 mg by mouth daily.       . [DISCONTINUED] colesevelam (WELCHOL) 625 MG tablet Take 1,875 mg by mouth daily.      . [DISCONTINUED] pantoprazole (PROTONIX) 40 MG tablet Take 1 tablet (40 mg total) by mouth 2 (two) times daily.  60 tablet  0  . [DISCONTINUED] donepezil (ARICEPT) 10 MG tablet Take 10 mg by mouth daily.      . [DISCONTINUED] insulin lispro (HUMALOG) 100 UNIT/ML injection Inject 5-15 Units into the skin 3 (three) times daily before meals.       No facility-administered encounter medications on file as of 05/09/2012.   No Known Allergies Patient Active Problem List  Diagnosis  . DIABETIC PERIPHERAL NEUROPATHY  . HYPERLIPIDEMIA  . AMAUROSIS FUGAX  . HYPERTENSION  . CORONARY ARTERY DISEASE  . CVA  . Peripheral vascular disease, unspecified  . ACHLORHYDRIA  . RENAL INSUFFICIENCY  . MURMUR  . CAROTID BRUIT  . HIP PAIN, RIGHT  . Diabetes mellitus type 2, insulin dependent  . Hyperlipidemia  . Dementia  . B12 deficiency  . Fecal incontinence  . GIB (gastrointestinal bleeding)  . Anemia  . Diabetes mellitus  . Acute blood loss anemia  . Duodenal ulcer, acute  . Rib pain on left side   History  Substance Use Topics  . Smoking status: Former Smoker    Types: Cigarettes    Quit date: 12/31/2000  . Smokeless tobacco: Never Used  . Alcohol Use: No       Objective:   Physical Exam well-developed elderly white female in no acute distress, pleasant blood pressure 130/60 pulse 66 height 5 foot 6 weight 165. HEENT;  nontraumatic normocephalic EOMI PERRLA sclera anicteric, Neck ;supple no JVD, Cardiovascular; regular rate and rhythm with S1-S2 no murmur or gallop, Pulmonary; clear bilaterally, Abdomen; soft, minimally tender in the right mid quadrant there is no guarding or rebound no palpable mass or hepatosplenomegaly bowel sounds are active, Rectal; exam dark brown stool Hemoccult negative. Extremities; no clubbing cyanosis or edema skin warm and dry, Psych; mood and affect normal and appropriate.  Assessment & Plan:  #89  79 year old female presenting with complaints of black diarrhea is 24 hours with concern for GI bleeding. Stool is Hemoccult-negative in the office today and her hemoglobin is 12.8 which is very assuring. I suspect she has had a mild gastroenteritis #2 history of acute upper GI bleed secondary to duodenal ulcer September 2013 #3 chronic antiplatelet therapy with Plavix and aspirin #4 dementia #5 insulin-dependent diabetes mellitus #6 coronary artery disease #7 peripheral vascular disease #8 history of CVA  Plan; patient is reassured that there is no evidence of GI bleeding. She will continue Protonix 40 mg daily Bland diet over the next couple of days with gradual advancement and use Imodium when necessary She is encouraged to call back in 3- 4 days If her diarrhea is persistent.

## 2012-05-12 ENCOUNTER — Encounter: Payer: Medicare Other | Admitting: Internal Medicine

## 2012-05-16 ENCOUNTER — Other Ambulatory Visit: Payer: Self-pay | Admitting: Internal Medicine

## 2012-05-30 ENCOUNTER — Ambulatory Visit: Payer: Medicare Other | Admitting: Family

## 2012-06-01 ENCOUNTER — Other Ambulatory Visit: Payer: Self-pay

## 2012-06-02 ENCOUNTER — Ambulatory Visit: Payer: Medicare Other | Admitting: Family

## 2012-06-04 ENCOUNTER — Encounter (HOSPITAL_COMMUNITY): Payer: Self-pay | Admitting: *Deleted

## 2012-06-04 ENCOUNTER — Emergency Department (HOSPITAL_COMMUNITY): Payer: Medicare Other

## 2012-06-04 ENCOUNTER — Observation Stay (HOSPITAL_COMMUNITY)
Admission: EM | Admit: 2012-06-04 | Discharge: 2012-06-07 | DRG: 303 | Disposition: A | Discharge status: Home / Self Care | Payer: Medicare Other | Attending: Internal Medicine | Admitting: Internal Medicine

## 2012-06-04 DIAGNOSIS — R079 Chest pain, unspecified: Secondary | ICD-10-CM

## 2012-06-04 DIAGNOSIS — R5381 Other malaise: Secondary | ICD-10-CM | POA: Insufficient documentation

## 2012-06-04 DIAGNOSIS — E1142 Type 2 diabetes mellitus with diabetic polyneuropathy: Secondary | ICD-10-CM | POA: Insufficient documentation

## 2012-06-04 DIAGNOSIS — Z951 Presence of aortocoronary bypass graft: Secondary | ICD-10-CM

## 2012-06-04 DIAGNOSIS — R5383 Other fatigue: Secondary | ICD-10-CM | POA: Insufficient documentation

## 2012-06-04 DIAGNOSIS — E11319 Type 2 diabetes mellitus with unspecified diabetic retinopathy without macular edema: Secondary | ICD-10-CM | POA: Insufficient documentation

## 2012-06-04 DIAGNOSIS — E785 Hyperlipidemia, unspecified: Secondary | ICD-10-CM | POA: Diagnosis present

## 2012-06-04 DIAGNOSIS — I498 Other specified cardiac arrhythmias: Secondary | ICD-10-CM | POA: Insufficient documentation

## 2012-06-04 DIAGNOSIS — E1149 Type 2 diabetes mellitus with other diabetic neurological complication: Secondary | ICD-10-CM | POA: Insufficient documentation

## 2012-06-04 DIAGNOSIS — I200 Unstable angina: Secondary | ICD-10-CM | POA: Insufficient documentation

## 2012-06-04 DIAGNOSIS — I2510 Atherosclerotic heart disease of native coronary artery without angina pectoris: Secondary | ICD-10-CM | POA: Insufficient documentation

## 2012-06-04 DIAGNOSIS — I10 Essential (primary) hypertension: Secondary | ICD-10-CM | POA: Insufficient documentation

## 2012-06-04 DIAGNOSIS — Z79899 Other long term (current) drug therapy: Secondary | ICD-10-CM | POA: Insufficient documentation

## 2012-06-04 DIAGNOSIS — I25810 Atherosclerosis of coronary artery bypass graft(s) without angina pectoris: Principal | ICD-10-CM | POA: Insufficient documentation

## 2012-06-04 DIAGNOSIS — E1139 Type 2 diabetes mellitus with other diabetic ophthalmic complication: Secondary | ICD-10-CM | POA: Insufficient documentation

## 2012-06-04 DIAGNOSIS — R001 Bradycardia, unspecified: Secondary | ICD-10-CM

## 2012-06-04 DIAGNOSIS — Z794 Long term (current) use of insulin: Secondary | ICD-10-CM | POA: Insufficient documentation

## 2012-06-04 DIAGNOSIS — I739 Peripheral vascular disease, unspecified: Secondary | ICD-10-CM | POA: Insufficient documentation

## 2012-06-04 LAB — CBC WITH DIFFERENTIAL/PLATELET
Basophils Absolute: 0 10*3/uL (ref 0.0–0.1)
Basophils Relative: 1 % (ref 0–1)
Eosinophils Absolute: 0.2 10*3/uL (ref 0.0–0.7)
Eosinophils Relative: 3 % (ref 0–5)
MCH: 31 pg (ref 26.0–34.0)
MCHC: 35.4 g/dL (ref 30.0–36.0)
MCV: 87.7 fL (ref 78.0–100.0)
Monocytes Absolute: 0.7 10*3/uL (ref 0.1–1.0)
Platelets: 146 10*3/uL — ABNORMAL LOW (ref 150–400)
RDW: 14.1 % (ref 11.5–15.5)
WBC: 7.2 10*3/uL (ref 4.0–10.5)

## 2012-06-04 LAB — POCT I-STAT, CHEM 8
Calcium, Ion: 1.25 mmol/L (ref 1.13–1.30)
Hemoglobin: 10.9 g/dL — ABNORMAL LOW (ref 12.0–15.0)
Sodium: 144 mEq/L (ref 135–145)
TCO2: 24 mmol/L (ref 0–100)

## 2012-06-04 LAB — POCT I-STAT TROPONIN I: Troponin i, poc: 0 ng/mL (ref 0.00–0.08)

## 2012-06-04 MED ORDER — FUROSEMIDE 20 MG PO TABS
20.0000 mg | ORAL_TABLET | Freq: Every day | ORAL | Status: DC
  Administered 2012-06-05 – 2012-06-07 (×3): 20 mg via ORAL
  Filled 2012-06-04 (×3): qty 1

## 2012-06-04 MED ORDER — INSULIN LISPRO 100 UNIT/ML ~~LOC~~ SOLN
5.0000 [IU] | Freq: Three times a day (TID) | SUBCUTANEOUS | Status: DC
  Filled 2012-06-04: qty 3

## 2012-06-04 MED ORDER — INSULIN ASPART 100 UNIT/ML ~~LOC~~ SOLN: 0.0000 [IU] | Freq: Every day | SUBCUTANEOUS | Status: DC

## 2012-06-04 MED ORDER — ONDANSETRON HCL 4 MG/2ML IJ SOLN: 4.0000 mg | Freq: Four times a day (QID) | INTRAMUSCULAR | Status: DC | PRN

## 2012-06-04 MED ORDER — METOPROLOL SUCCINATE 12.5 MG HALF TABLET
12.5000 mg | ORAL_TABLET | Freq: Every day | ORAL | Status: DC
  Filled 2012-06-04: qty 1

## 2012-06-04 MED ORDER — ATORVASTATIN CALCIUM 80 MG PO TABS
80.0000 mg | ORAL_TABLET | Freq: Every day | ORAL | Status: DC
  Administered 2012-06-05 – 2012-06-06 (×2): 80 mg via ORAL
  Filled 2012-06-04 (×3): qty 1

## 2012-06-04 MED ORDER — INSULIN LISPRO 100 UNIT/ML ~~LOC~~ SOLN: 5.0000 [IU] | Freq: Three times a day (TID) | SUBCUTANEOUS | Status: DC

## 2012-06-04 MED ORDER — ISOSORBIDE MONONITRATE ER 30 MG PO TB24
30.0000 mg | ORAL_TABLET | Freq: Every day | ORAL | Status: DC
  Administered 2012-06-05 – 2012-06-06 (×2): 30 mg via ORAL
  Filled 2012-06-04 (×3): qty 1

## 2012-06-04 MED ORDER — DONEPEZIL HCL 10 MG PO TABS
10.0000 mg | ORAL_TABLET | Freq: Every day | ORAL | Status: DC
  Administered 2012-06-05: 10 mg via ORAL
  Filled 2012-06-04 (×2): qty 1

## 2012-06-04 MED ORDER — CLOPIDOGREL BISULFATE 75 MG PO TABS
75.0000 mg | ORAL_TABLET | Freq: Every day | ORAL | Status: DC
  Administered 2012-06-05 – 2012-06-07 (×3): 75 mg via ORAL
  Filled 2012-06-04 (×4): qty 1

## 2012-06-04 MED ORDER — VITAMIN B-12 1000 MCG PO TABS
1000.0000 ug | ORAL_TABLET | Freq: Every day | ORAL | Status: DC
  Administered 2012-06-05 – 2012-06-07 (×3): 1000 ug via ORAL
  Filled 2012-06-04 (×3): qty 1

## 2012-06-04 MED ORDER — COLESEVELAM HCL 625 MG PO TABS
1875.0000 mg | ORAL_TABLET | Freq: Three times a day (TID) | ORAL | Status: DC
  Filled 2012-06-04 (×4): qty 3

## 2012-06-04 MED ORDER — PANTOPRAZOLE SODIUM 40 MG PO TBEC
40.0000 mg | DELAYED_RELEASE_TABLET | Freq: Every day | ORAL | Status: DC
  Administered 2012-06-05 – 2012-06-07 (×3): 40 mg via ORAL
  Filled 2012-06-04 (×3): qty 1

## 2012-06-04 MED ORDER — INSULIN GLARGINE 100 UNIT/ML ~~LOC~~ SOLN
130.0000 [IU] | Freq: Every day | SUBCUTANEOUS | Status: DC
  Administered 2012-06-05: 130 [IU] via SUBCUTANEOUS
  Filled 2012-06-04 (×2): qty 1.3

## 2012-06-04 MED ORDER — QUINAPRIL HCL 10 MG PO TABS
20.0000 mg | ORAL_TABLET | Freq: Every day | ORAL | Status: DC
  Administered 2012-06-04 – 2012-06-06 (×3): 20 mg via ORAL
  Filled 2012-06-04 (×4): qty 2

## 2012-06-04 MED ORDER — AMLODIPINE BESYLATE 10 MG PO TABS
10.0000 mg | ORAL_TABLET | Freq: Every day | ORAL | Status: DC
  Administered 2012-06-05 – 2012-06-07 (×3): 10 mg via ORAL
  Filled 2012-06-04 (×3): qty 1

## 2012-06-04 MED ORDER — VITAMIN E 180 MG (400 UNIT) PO CAPS
400.0000 [IU] | ORAL_CAPSULE | Freq: Every day | ORAL | Status: DC
  Administered 2012-06-05 – 2012-06-07 (×3): 400 [IU] via ORAL
  Filled 2012-06-04 (×4): qty 1

## 2012-06-04 MED ORDER — QUINAPRIL HCL 10 MG PO TABS: 20.0000 mg | ORAL_TABLET | Freq: Every day | ORAL | Status: DC

## 2012-06-04 MED ORDER — MEMANTINE HCL 10 MG PO TABS
10.0000 mg | ORAL_TABLET | Freq: Two times a day (BID) | ORAL | Status: DC
  Administered 2012-06-05 – 2012-06-07 (×5): 10 mg via ORAL
  Filled 2012-06-04 (×7): qty 1

## 2012-06-04 MED ORDER — ENOXAPARIN SODIUM 80 MG/0.8ML ~~LOC~~ SOLN
75.0000 mg | Freq: Two times a day (BID) | SUBCUTANEOUS | Status: DC
  Administered 2012-06-04 – 2012-06-06 (×4): 75 mg via SUBCUTANEOUS
  Filled 2012-06-04 (×5): qty 0.8

## 2012-06-04 MED ORDER — ACETAMINOPHEN 325 MG PO TABS: 650.0000 mg | ORAL_TABLET | ORAL | Status: DC | PRN

## 2012-06-04 MED ORDER — INSULIN ASPART 100 UNIT/ML ~~LOC~~ SOLN
0.0000 [IU] | Freq: Three times a day (TID) | SUBCUTANEOUS | Status: DC
  Administered 2012-06-05 (×2): 3 [IU] via SUBCUTANEOUS
  Administered 2012-06-05: 2 [IU] via SUBCUTANEOUS
  Administered 2012-06-06 (×2): 3 [IU] via SUBCUTANEOUS

## 2012-06-04 MED ORDER — NITROGLYCERIN 0.4 MG SL SUBL: 0.4000 mg | SUBLINGUAL_TABLET | SUBLINGUAL | Status: DC | PRN

## 2012-06-04 MED ORDER — ASPIRIN EC 81 MG PO TBEC
81.0000 mg | DELAYED_RELEASE_TABLET | Freq: Every day | ORAL | Status: DC
  Administered 2012-06-05 – 2012-06-07 (×3): 81 mg via ORAL
  Filled 2012-06-04 (×3): qty 1

## 2012-06-04 NOTE — ED Provider Notes (Signed)
History     CSN: [id number]  Arrival date & time 06/04/12  1655   None     Chief Complaint  Patient presents with  . Chest Pain   level V caveat dementia  (Consider location/radiation/quality/duration/timing/severity/associated sxs/prior treatment) HPI Complains of anterior chest pain onset approximately 4 days ago feels like "heart pain" she's had in the past she is treated herself with nitroglycerin sublingually with relief. She denies any shortness of breath nausea or sweatiness associated with the pain. No other associated symptoms. Presently pain free. She is presently asymptomatic. Reports discomfortworse with exertion, improved with rest Reports that she treated herself with 4 baby aspirins this morning. Past Medical History  Diagnosis Date  . Undiagnosed cardiac murmurs   . PVD (peripheral vascular disease)   . CAD (coronary artery disease)   . Hypertension   . Hypernatremia   . Hyperlipidemia   . Duodenal ulcer 2009 and 11/2011  . Hyperkalemia   . Achlorhydria   . Iron deficiency anemia secondary to blood loss (chronic)   . Amaurosis fugax   . Carotid bruit   . Diabetes mellitus type II   . Dementia     pt denies  . Diabetic retinopathy(362.0)   . Gallstones 2009    seen on CT scan  . Diverticulosis   . Atherosclerosis   . Renal insufficiency   . CVA (cerebral infarction)   . Diabetic peripheral neuropathy   . GI bleed 04/2011, 11/2011    Cause not defined.   . Colon polyp     Past Surgical History  Procedure Laterality Date  . Coronary artery bypass graft  1999    x 4  . Abdominal hysterectomy    . Upper gastrointestinal endoscopy  02/22/2008    w/biopsy, duedenal ulcer, antrum erosions  . Esophagogastroduodenoscopy  05/16/2011    Procedure: ESOPHAGOGASTRODUODENOSCOPY (EGD);  Surgeon: Gatha Mayer, MD;  Location: [facility] ENDOSCOPY;  Service: Endoscopy;  Laterality: N/A;  . Colonoscopy  05/20/2011    Procedure: COLONOSCOPY;  Surgeon: Scarlette Shorts, MD;  Location:  [place];  Service: Endoscopy;  Laterality: N/A;  . Esophagogastroduodenoscopy  12/08/2011    Procedure: ESOPHAGOGASTRODUODENOSCOPY (EGD);  Surgeon: Ladene Artist, MD,FACG;  Location: [facility] ENDOSCOPY;  Service: Endoscopy;  Laterality: N/A;    Family History  Problem Relation Age of Onset  . Coronary artery disease Father   . Hypertension Father   . Stroke Father   . Cancer Mother     ?  . Diabetes Maternal Uncle   . Colon cancer Neg Hx     History  Substance Use Topics  . Smoking status: Former Smoker    Types: Cigarettes    Quit date: 12/31/2000  . Smokeless tobacco: Never Used  . Alcohol Use: No    OB History   Grav Para Term Preterm Abortions TAB SAB Ect Mult Living                  Review of Systems  Unable to perform ROS: Dementia  Cardiovascular: Positive for chest pain.    Allergies  Review of patient's allergies indicates no known allergies.  Home Medications   Current Outpatient Rx  Name  Route  Sig  Dispense  Refill  . amLODipine (NORVASC) 10 MG tablet   Oral   Take 10 mg by mouth daily.         Marland Kitchen amLODipine (NORVASC) 10 MG tablet      TAKE 1 TABLET BY MOUTH DAILY.   90 tablet  1   . aspirin 81 MG tablet   Oral   Take 81 mg by mouth. 2 with Am meds and 2 with lunch         . Cholecalciferol (VITAMIN D) 1000 UNITS capsule   Oral   Take 1,000 Units by mouth daily.          . clopidogrel (PLAVIX) 75 MG tablet      TAKE 1 TABLET (75 MG TOTAL) BY MOUTH DAILY.   90 tablet   1   . donepezil (ARICEPT) 10 MG tablet      TAKE 1 TABLET (10 MG TOTAL) BY MOUTH DAILY.   30 tablet   6   . Ferrous Sulfate (IRON) 325 (65 FE) MG TABS   Oral   Take 325 mg by mouth 2 (two) times daily.          . furosemide (LASIX) 20 MG tablet      TAKE 1 TABLET (20 MG TOTAL) BY MOUTH DAILY.   90 tablet   1   . Glucosamine-Chondroit-Vit C-Mn (GLUCOSAMINE CHONDR 500 COMPLEX) CAPS   Oral   Take 1 capsule by mouth daily.          . insulin  glargine (LANTUS) 100 UNIT/ML injection   Subcutaneous   Inject 40 Units into the skin at bedtime. Pt uses sliding scale         . insulin lispro (HUMALOG KWIKPEN) 100 UNIT/ML injection      INJECT 5-15 UNITS INTO THE SKIN [3] THREE TIMES DAILY BEFORE MEALS.   15 mL   3   . isosorbide mononitrate (IMDUR) 30 MG 24 hr tablet      TAKE 1 TABLET (30 MG TOTAL) BY MOUTH DAILY.   90 tablet   1   . memantine (NAMENDA) 10 MG tablet   Oral   Take 10 mg by mouth 2 (two) times daily.         . metoprolol succinate (TOPROL-XL) 25 MG 24 hr tablet   Oral   Take 25 mg by mouth daily.         . metoprolol succinate (TOPROL-XL) 50 MG 24 hr tablet      TAKE 1/2 TABLET BY MOUTH ONCE DAILY   45 tablet   1   . Multiple Vitamins-Minerals (CENTRUM SILVER PO)   Oral   Take 1 tablet by mouth daily.          . nitroGLYCERIN (NITROSTAT) 0.4 MG SL tablet   Sublingual   Place 0.4 mg under the tongue every 5 (five) minutes as needed. Chest pain         . Omega-3 Fatty Acids (FISH OIL) 1200 MG CAPS   Oral   Take 1 capsule by mouth daily.         . pantoprazole (PROTONIX) 40 MG tablet      TAKE 1 TABLET BY MOUTH ONCE DAILY 30 MINUTES BEFORE A MEAL   90 tablet   1   . quinapril (ACCUPRIL) 40 MG tablet   Oral   Take 0.5 tablets (20 mg total) by mouth at bedtime.   30 tablet   0   . rosuvastatin (CRESTOR) 20 MG tablet   Oral   Take 20 mg by mouth daily.         . vitamin B-12 (CYANOCOBALAMIN) 1000 MCG tablet   Oral   Take 1,000 mcg by mouth daily.         . vitamin E 400 UNIT capsule  Oral   Take 400 Units by mouth daily.         . WELCHOL 625 MG tablet      TAKE 3 TABLETS (1,875 MG TOTAL) BY MOUTH DAILY.   180 tablet   1     BP 158/33  Pulse 51  Temp(Src) 97.7 F (36.5 C) (Oral)  Resp 18  Ht 5' 6.5" (1.689 m)  Wt 165 lb (74.844 kg)  BMI 26.24 kg/m2  SpO2 97%  Physical Exam  Nursing note and vitals reviewed. Constitutional: She appears  well-developed and well-nourished. No distress.  HENT:  Head: Normocephalic and atraumatic.  Eyes: Conjunctivae are normal. Pupils are equal, round, and reactive to light.  Neck: Neck supple. No tracheal deviation present. No thyromegaly present.  Cardiovascular: Regular rhythm.   No murmur heard. Bradycardic  Pulmonary/Chest: Effort normal and breath sounds normal.  Abdominal: Soft. Bowel sounds are normal. She exhibits no distension. There is no tenderness.  Musculoskeletal: Normal range of motion. She exhibits no edema and no tenderness.  Neurological: She is alert. Coordination normal.  Skin: Skin is warm and dry. No rash noted.  Psychiatric: She has a normal mood and affect.    ED Course  Procedures (including critical care time)  Labs Reviewed - No data to display No results found.  6:35 PM patient remains asymptomatic No diagnosis found.   Date: 06/04/2012  Rate: 50  Rhythm: sinus bradycardia  QRS Axis: normal  Intervals: PR prolonged  ST/T Wave abnormalities: nonspecific T wave changes  Conduction Disutrbances:nonspecific intraventricular conduction delay  Narrative Interpretation:   Old EKG Reviewed: Low-voltage diffusely, no significant change from 12/30/2011 interpreted by me Results for orders placed during the hospital encounter of 06/04/12  CBC WITH DIFFERENTIAL      Result Value Range   WBC 7.2  4.0 - 10.5 K/uL   RBC 3.74 (*) 3.87 - 5.11 MIL/uL   Hemoglobin 11.6 (*) 12.0 - 15.0 g/dL   HCT 32.8 (*) 36.0 - 46.0 %   MCV 87.7  78.0 - 100.0 fL   MCH 31.0  26.0 - 34.0 pg   MCHC 35.4  30.0 - 36.0 g/dL   RDW 14.1  11.5 - 15.5 %   Platelets 146 (*) 150 - 400 K/uL   Neutrophils Relative 52  43 - 77 %   Neutro Abs 3.7  1.7 - 7.7 K/uL   Lymphocytes Relative 36  12 - 46 %   Lymphs Abs 2.5  0.7 - 4.0 K/uL   Monocytes Relative 9  3 - 12 %   Monocytes Absolute 0.7  0.1 - 1.0 K/uL   Eosinophils Relative 3  0 - 5 %   Eosinophils Absolute 0.2  0.0 - 0.7 K/uL    Basophils Relative 1  0 - 1 %   Basophils Absolute 0.0  0.0 - 0.1 K/uL  POCT I-STAT, CHEM 8      Result Value Range   Sodium 144  135 - 145 mEq/L   Potassium 4.1  3.5 - 5.1 mEq/L   Chloride 113 (*) 96 - 112 mEq/L   BUN 27 (*) 6 - 23 mg/dL   Creatinine, Ser 1.40 (*) 0.50 - 1.10 mg/dL   Glucose, Bld 96  70 - 99 mg/dL   Calcium, Ion 1.25  1.13 - 1.30 mmol/L   TCO2 24  0 - 100 mmol/L   Hemoglobin 10.9 (*) 12.0 - 15.0 g/dL   HCT 32.0 (*) 36.0 - 46.0 %  POCT I-STAT TROPONIN I  Result Value Range   Troponin i, poc 0.00  0.00 - 0.08 ng/mL   Comment 3            Dg Chest Port 1 View  06/04/2012  *RADIOLOGY REPORT*  Clinical Data: Chest pain and short of breath  PORTABLE CHEST - 1 VIEW  Comparison: 05/14/2011  Findings: Postop CABG.  Cardiac enlargement without heart failure or effusion.  Negative for pneumonia.  IMPRESSION: No acute abnormality.   Original Report Authenticated By: Carl Best, M.D.     Chest x-ray viewed by me. MDM  Symptoms concerning for unstable angina. Dr. Posey Pronto, cardiology service called by me to evaluate patient in ED. He aranged for inpatient stay Diagnosis #1 chest pain #2 renal insufficiency       Orlie Dakin, MD 06/04/12 [id number]

## 2012-06-04 NOTE — ED Notes (Signed)
Pt with hx of  quadruple bipass surgery to ED c/o chest pain since Monday.  Pain is increasing and not relieved by nitro (pt took 2 SL tabs - last around 4 pm).  Denies nausea, but companion states sob.

## 2012-06-04 NOTE — ED Notes (Signed)
Pt having v-tach runs of approximately 6-7 beats in a row and intermittent chest pain. Dr. Posey Pronto paged. Pt is AAOx4 NAD noted at this time. Will continue to monitor.

## 2012-06-04 NOTE — Progress Notes (Signed)
ANTICOAGULATION CONSULT NOTE - Follow Up Consult  Pharmacy Consult for lovenox Indication: chest pain/ACS  No Known Allergies  Patient Measurements: Height: 5' 6.5" (168.9 cm) Weight: 168 lb 14 oz (76.6 kg) IBW/kg (Calculated) : 60.45 Heparin Dosing Weight: 75 kg  Vital Signs: Temp: 97.9 F (36.6 C) (03/22 2048) Temp src: Oral (03/22 2048) BP: 156/56 mmHg (03/22 2048) Pulse Rate: 47 (03/22 2048)  Labs:  Recent Labs  06/04/12 1752 06/04/12 1809  HGB 11.6* 10.9*  HCT 32.8* 32.0*  PLT 146*  --   CREATININE  --  1.40*    Estimated Creatinine Clearance: 34.4 ml/min (by C-G formula based on Cr of 1.4).   Medications:  Scheduled:  . amLODipine  10 mg Oral Daily  . [START ON 06/05/2012] aspirin EC  81 mg Oral Daily  . [START ON 06/05/2012] atorvastatin  80 mg Oral q1800  . clopidogrel  75 mg Oral Daily  . [START ON 06/05/2012] colesevelam  1,875 mg Oral TID WC  . donepezil  10 mg Oral Daily  . furosemide  20 mg Oral Daily  . insulin glargine  130 Units Subcutaneous QHS  . [START ON 06/05/2012] insulin lispro  5-15 Units Subcutaneous TID AC  . isosorbide mononitrate  30 mg Oral Daily  . memantine  10 mg Oral BID  . metoprolol succinate  12.5 mg Oral Daily  . pantoprazole  40 mg Oral Daily  . quinapril  20 mg Oral QHS  . quinapril  20 mg Oral QHS  . vitamin B-12  1,000 mcg Oral Daily  . vitamin E  400 Units Oral Daily   Infusions:    Assessment: 79 yo female with ACS will be put on lovenox treatment dosing.  SCr 1.4 and CrCl ~34.4. Baseline H/H 11.6/32.8 and Plt 146 K Goal of Therapy:  Anti-Xa level 0.6-1.2 units/ml 4hrs after LMWH dose given Monitor platelets by anticoagulation protocol: Yes   Plan:  1) Lovenox 75mg  sq q12h 2) CBC every 72 hours  Eagan Shifflett, Tsz-Yin 06/04/2012,9:26 PM

## 2012-06-04 NOTE — H&P (Signed)
Patient ID: Destiny [name] MRN: [id number], DOB/AGE: 77-14-1933   Admit date: 06/04/2012   Primary Physician: Jeb Levering, Philbert Riser, MD Primary Cardiologist: Johnsie Cancel  Pt. Profile:  Chest pain  Problem List  Past Medical History  Diagnosis Date  . PVD (peripheral vascular disease)   . CAD (coronary artery disease)   . Hypertension   . Hyperlipidemia   . Duodenal ulcer 2009 and 11/2011  . Iron deficiency anemia secondary to blood loss (chronic)   . Amaurosis fugax   . Carotid bruit   . Diabetes mellitus type II   . Dementia     pt denies  . Diabetic retinopathy(362.0)   . Gallstones 2009    seen on CT scan  . Diverticulosis   . Atherosclerosis   . Renal insufficiency   . CVA (cerebral infarction)   . Diabetic peripheral neuropathy   . GI bleed 04/2011, 11/2011    Cause not defined.   . Colon polyp     Past Surgical History  Procedure Laterality Date  . Coronary artery bypass graft  1999    x 4  . Abdominal hysterectomy    . Upper gastrointestinal endoscopy  02/22/2008    w/biopsy, duedenal ulcer, antrum erosions  . Esophagogastroduodenoscopy  05/16/2011    Procedure: ESOPHAGOGASTRODUODENOSCOPY (EGD);  Surgeon: Gatha Mayer, MD;  Location: [facility] ENDOSCOPY;  Service: Endoscopy;  Laterality: N/A;  . Colonoscopy  05/20/2011    Procedure: COLONOSCOPY;  Surgeon: Scarlette Shorts, MD;  Location: [place];  Service: Endoscopy;  Laterality: N/A;  . Esophagogastroduodenoscopy  12/08/2011    Procedure: ESOPHAGOGASTRODUODENOSCOPY (EGD);  Surgeon: Ladene Artist, MD,FACG;  Location: [facility] ENDOSCOPY;  Service: Endoscopy;  Laterality: N/A;     Allergies  No Known Allergies  HPI  This is a 79 year old female who has been having intermittent chest pain (described as sharp chest pain all over the chest) over the last week that occurs with exertion.  It radiates to bilateral arms occasionally.  The chest pain lasts a few minutes but then recurs.  She started taking NTG that helped  yesterday.  However she had recurrent pain today that did not resolve with NTG x 3 thus she presented to the ED.  She has also has had significant fatigue and dyspnea on exertion.  She cannot recall if this is her usual angina pain.  She had an angiogram in 2010 with findings as below (she cannot recall having this).  She denies orthopnea/PND, presyncope/syncope, palpitations.  Workup in the ED with EKG and cardiac enzymes are unremarkable.   Home Medications  Prior to Admission medications   Medication Sig Start Date End Date Taking? Authorizing Provider  amLODipine (NORVASC) 10 MG tablet Take 10 mg by mouth daily.   Yes Historical Provider, MD  aspirin 81 MG tablet Take 162 mg by mouth 2 (two) times daily. Takes 2 tablets with morning medications,  and 2 tablets with lunch.   Yes Historical Provider, MD  Cholecalciferol (VITAMIN D) 1000 UNITS capsule Take 1,000 Units by mouth daily.    Yes Historical Provider, MD  clopidogrel (PLAVIX) 75 MG tablet Take 75 mg by mouth daily.   Yes Historical Provider, MD  colesevelam (WELCHOL) 625 MG tablet Take 1,875 mg by mouth 3 (three) times daily with meals.   Yes Historical Provider, MD  donepezil (ARICEPT) 10 MG tablet Take 10 mg by mouth daily.   Yes Historical Provider, MD  Ferrous Sulfate (IRON) 325 (65 FE) MG TABS Take 325  mg by mouth 2 (two) times daily.    Yes Historical Provider, MD  furosemide (LASIX) 20 MG tablet Take 20 mg by mouth daily.   Yes Historical Provider, MD  Glucosamine-Chondroit-Vit C-Mn (GLUCOSAMINE CHONDR 500 COMPLEX) CAPS Take 1 capsule by mouth daily.    Yes Historical Provider, MD  insulin glargine (LANTUS) 100 UNIT/ML injection Inject 130 Units into the skin at bedtime.  07/15/11 07/14/12 Yes Burnice Logan, MD  insulin lispro (HUMALOG) 100 UNIT/ML injection Inject 5-15 Units into the skin 3 (three) times daily before meals. Sliding scale 03/15/12  Yes Burnice Logan, MD  isosorbide mononitrate (IMDUR) 30 MG 24 hr tablet Take 30 mg  by mouth daily.   Yes Historical Provider, MD  memantine (NAMENDA) 10 MG tablet Take 10 mg by mouth 2 (two) times daily. 07/07/11  Yes Burnice Logan, MD  metoprolol succinate (TOPROL-XL) 50 MG 24 hr tablet Take 25 mg by mouth daily. Take with or immediately following a meal.   Yes Historical Provider, MD  Multiple Vitamins-Minerals (CENTRUM SILVER PO) Take 1 tablet by mouth daily.    Yes Historical Provider, MD  nitroGLYCERIN (NITROSTAT) 0.4 MG SL tablet Place 0.4 mg under the tongue every 5 (five) minutes as needed for chest pain. Chest pain   Yes Historical Provider, MD  Omega-3 Fatty Acids (FISH OIL) 1200 MG CAPS Take 1 capsule by mouth daily.   Yes Historical Provider, MD  pantoprazole (PROTONIX) 40 MG tablet Take 40 mg by mouth daily.   Yes Historical Provider, MD  quinapril (ACCUPRIL) 40 MG tablet Take 20 mg by mouth at bedtime. 12/10/11  Yes Reyne Dumas, MD  rosuvastatin (CRESTOR) 20 MG tablet Take 20 mg by mouth daily.   Yes Historical Provider, MD  vitamin B-12 (CYANOCOBALAMIN) 1000 MCG tablet Take 1,000 mcg by mouth daily.   Yes Historical Provider, MD  vitamin E 400 UNIT capsule Take 400 Units by mouth daily.   Yes Historical Provider, MD    Family History  Family History  Problem Relation Age of Onset  . Coronary artery disease Father   . Hypertension Father   . Stroke Father   . Cancer Mother     ?  . Diabetes Maternal Uncle   . Colon cancer Neg Hx     Social History  History   Social History  . Marital Status: Widowed    Spouse Name: N/A    Number of Children: 4  . Years of Education: N/A   Occupational History  . retired    Social History Main Topics  . Smoking status: Former Smoker    Types: Cigarettes    Quit date: 12/31/2000  . Smokeless tobacco: Never Used  . Alcohol Use: No  . Drug Use: No  . Sexually Active: Not on file   Other Topics Concern  . Not on file   Social History Narrative   Widowed - husband passed away in 03-Jun-2009   lives with  daughter with epilepsy - mental retardation   Retired     Former Smoker      Alcohol use-no          Occupation: Retired       son - Jamyla Montera      Review of Systems General:  No chills, fever, night sweats or weight changes.  Cardiovascular:  + chest pain, dyspnea on exertion, no edema, orthopnea, palpitations, paroxysmal nocturnal dyspnea. Dermatological: No rash, lesions/masses Respiratory: No cough, dyspnea Urologic: No hematuria, dysuria Abdominal:  No nausea, vomiting, diarrhea, bright red blood per rectum, melena, or hematemesis Neurologic:  No visual changes, wkns, changes in mental status. All other systems reviewed and are otherwise negative except as noted above.  Physical Exam  Blood pressure 158/42, pulse 51, temperature 97.7 F (36.5 C), temperature source Oral, resp. rate 15, height 5' 6.5" (1.689 m), weight 74.844 kg (165 lb), SpO2 97.00%.  General: Pleasant, NAD Psych: Normal affect. Neuro: Alert and oriented X 3. Moves all extremities spontaneously. HEENT: Normal  Neck: Supple without bruits or JVD. Lungs:  Resp regular and unlabored, CTA. Heart: RRR no s3, s4, or murmurs. Abdomen: Soft, non-tender, non-distended, BS + x 4.  Extremities: No clubbing, cyanosis or edema.   Labs  No results found for this basename: CKTOTAL, CKMB, TROPONINI,  in the last 72 hours Lab Results  Component Value Date   WBC 7.2 06/04/2012   HGB 10.9* 06/04/2012   HCT 32.0* 06/04/2012   MCV 87.7 06/04/2012   PLT 146* 06/04/2012     Recent Labs Lab 06/04/12 1809  NA 144  K 4.1  CL 113*  BUN 27*  CREATININE 1.40*  GLUCOSE 96   Lab Results  Component Value Date   CHOL 179 02/26/2012   HDL 44.90 02/26/2012   LDLCALC 71 01/06/2012   TRIG 305.0* 02/26/2012   No results found for this basename: DDIMER     Troponin (Point of Care Test)  Recent Labs  06/04/12 1807  TROPIPOC 0.00    Radiology/Studies  No results found.  01/03/2009 ANGIOGRAPHIC FINDINGS: 1. The  left main coronary artery had no significant disease. 2. Left anterior descending had a long tubular 50% stenosis in the     proximal portion followed by 100% occlusion in the midportion.     There was a small diagonal branch that arose prior to the total     occlusion.  The mid distal LAD was found to fill from the left     internal mammary artery graft.  There is a large diagonal branch     that is known to be occluded from the LAD.  This used to fill from     the saphenous vein graft. 3. Circumflex artery is a moderate-to-large sized vessel that courses     through the AV groove.  The first and third obtuse marginal     branches are known to be occluded in the ostium.  Both of these     branches fill from the saphenous vein graft.  The AV groove     circumflex has serial 30% lesions throughout the proximal and     midportion.  There is diffuse 50% disease throughout the distal     portion of the vessel. 4. The right coronary artery is a small nondominant vessel that has     diffuse 99% stenosis throughout the proximal portion and has 100%     occlusion in the midportion.  The old cath report confirms that     this is a nondominant vessel. 5. Saphenous vein graft to the diagonal branch is occluded at the     aortic anastomosis. 6. The saphenous vein graft that is sequential to the first and third     obtuse marginals which is patent.  There was a 40% lesion at the     anastomosis to the first obtuse marginal branch.  There are mild     luminal irregularities in the distal body of the saphenous vein     graft.  7. The left internal mammary artery to the mid LAD is patent. 8. Left ventricular angiogram was performed in the RAO projection,     shows normal left ventricular systolic function with no wall motion     abnormalities.  Ejection fraction 60%. 9. The bilateral renal arteries are seem to be patent with mild plaque     only.  There is a single renal artery on both sides. 10.The  distal aorta has diffuse 30% plaque, but no aneurysm and no     tight stenosis. 11.The right common iliac artery has a 40% stenosis at the ostium.     There is mild plaque throughout the right external iliac artery as     well as the right common femoral artery.  The right superficial     femoral artery has serial 80% lesions throughout the proximal and     midportion.  The distal portion of the right superficial femoral     artery has serial 50% lesions.  The right popliteal artery has 50%     disease.  The right anterior tibial artery and posterior tibial     artery are occluded.  The right peroneal artery is patent to the     foot.  There is one-vessel runoff to the right foot. 12.The left common iliac artery, external iliac artery, and common     femoral artery had mild plaque.  The left superficial femoral     artery has 30% stenosis at the ostium.  There is diffuse 30% plaque     throughout the proximal mid and distal portions of the left     superficial femoral artery.  The left popliteal artery has an 80%     stenosis.  The left anterior tibial artery and posterior tibial     artery are occluded.  The left peroneal artery is patent to the     foot.  There is one-vessel runoff to the left foot.  IMPRESSION: 1. Severe native triple-vessel coronary artery disease, status post     four-vessel bypass with 3/4 patent bypass graft. 2. Normal left ventricular systolic function. 3. Severe peripheral vascular disease with severe disease in the right     superficial femoral artery, the left popliteal artery, and     bilateral occlusive disease below the knees with occlusion of both     the right and left anterior tibial artery and posterior tibial     artery.  ECG  NSR, no acute ST/T wave abnormalities  ASSESSMENT  1. Possible acute coronary syndrome with exertional chest pain over the last one week 2. Hx of CAD s/p CABG in 1999 with angiogram from 2010 showing 3/4 grafts occluded  (SVG to diagonal occluded) 3. HTN 4. DM 5. HLD 6. PAD  PLAN 1. Will admit to r/o MI with 2 more sets of cardiac enzymes / EKGs 2. ASA, statin, lovenox therapeutic dose for ACS 3. Reduce dose of Toprol XL due to bradycardia 4. Consider angiogram versus stress test depending on clinical course   Signed, Costella Hatcher, MD 06/04/2012, 6:46 PM

## 2012-06-04 NOTE — ED Notes (Signed)
Spoke to dr. Posey Pronto. Pt placed on 12-lead monitor. Pt denies CP at this time.

## 2012-06-05 ENCOUNTER — Encounter (HOSPITAL_COMMUNITY): Payer: Self-pay | Admitting: *Deleted

## 2012-06-05 DIAGNOSIS — Z951 Presence of aortocoronary bypass graft: Secondary | ICD-10-CM

## 2012-06-05 DIAGNOSIS — R5381 Other malaise: Secondary | ICD-10-CM | POA: Diagnosis present

## 2012-06-05 DIAGNOSIS — R001 Bradycardia, unspecified: Secondary | ICD-10-CM | POA: Diagnosis present

## 2012-06-05 DIAGNOSIS — R079 Chest pain, unspecified: Secondary | ICD-10-CM | POA: Insufficient documentation

## 2012-06-05 DIAGNOSIS — I498 Other specified cardiac arrhythmias: Secondary | ICD-10-CM

## 2012-06-05 LAB — BASIC METABOLIC PANEL
CO2: 21 mEq/L (ref 19–32)
Chloride: 108 mEq/L (ref 96–112)
Glucose, Bld: 121 mg/dL — ABNORMAL HIGH (ref 70–99)
Potassium: 3.8 mEq/L (ref 3.5–5.1)
Sodium: 143 mEq/L (ref 135–145)

## 2012-06-05 LAB — LIPID PANEL
HDL: 52 mg/dL (ref >39)
LDL Cholesterol: 52 mg/dL (ref 0–99)
Triglycerides: 260 mg/dL — ABNORMAL HIGH (ref <150)
VLDL: 52 mg/dL — ABNORMAL HIGH (ref 0–40)

## 2012-06-05 LAB — TROPONIN I
Troponin I: <0.3 ng/mL (ref <0.30)
Troponin I: <0.3 ng/mL (ref <0.30)

## 2012-06-05 LAB — CBC
Hemoglobin: 11.6 g/dL — ABNORMAL LOW (ref 12.0–15.0)
RBC: 3.82 MIL/uL — ABNORMAL LOW (ref 3.87–5.11)
WBC: 7.1 10*3/uL (ref 4.0–10.5)

## 2012-06-05 MED ORDER — COLESEVELAM HCL 625 MG PO TABS
625.0000 mg | ORAL_TABLET | Freq: Three times a day (TID) | ORAL | Status: DC
  Administered 2012-06-05 – 2012-06-07 (×6): 625 mg via ORAL
  Filled 2012-06-05 (×9): qty 1

## 2012-06-05 NOTE — Progress Notes (Signed)
Patient ID: Destiny [name], female   DOB: 02-17-1932, 79 y.o.   MRN: [id number]   SUBJECTIVE:  The patient was admitted yesterday. She has a history of severe coronary disease. Her last cath was in 2010. She is status post CABG in the past. 3 of 4 grafts were patent in 2010. The patient also has severe peripheral vascular disease. The patient was admitted with intermittent sharp chest pain. There was also significant bradycardia. 2 troponins are normal so far. There is no diagnostic EKG change.  The patient is not having any significant chest pain today. She had noted significant exertional fatigue on admission.   Filed Vitals:   06/04/12 2000 06/04/12 2048 06/05/12 0409 06/05/12 0646  BP: 159/52 156/56 147/47   Pulse: 46 47 45   Temp:  97.9 F (36.6 C) 98.4 F (36.9 C)   TempSrc:  Oral Oral   Resp: 18 18 18    Height:      Weight:  168 lb 14 oz (76.6 kg)  165 lb 9.1 oz (75.1 kg)  SpO2: 96% 94% 95%    No intake or output data in the 24 hours ending 06/05/12 0804  LABS: Basic Metabolic Panel:  Recent Labs  06/04/12 1809 06/05/12 0507  NA 144 143  K 4.1 3.8  CL 113* 108  CO2  --  21  GLUCOSE 96 121*  BUN 27* 23  CREATININE 1.40* 1.21*  CALCIUM  --  9.3   Liver Function Tests: No results found for this basename: AST, ALT, ALKPHOS, BILITOT, PROT, ALBUMIN,  in the last 72 hours No results found for this basename: LIPASE, AMYLASE,  in the last 72 hours CBC:  Recent Labs  06/04/12 1752 06/04/12 1809 06/05/12 0507  WBC 7.2  --  7.1  NEUTROABS 3.7  --   --   HGB 11.6* 10.9* 11.6*  HCT 32.8* 32.0* 32.6*  MCV 87.7  --  85.3  PLT 146*  --  145*   Cardiac Enzymes:  Recent Labs  06/04/12 2122 06/05/12 0507  TROPONINI <0.30 <0.30   BNP: No components found with this basename: POCBNP,  D-Dimer: No results found for this basename: DDIMER,  in the last 72 hours Hemoglobin A1C: No results found for this basename: HGBA1C,  in the last 72 hours Fasting Lipid  Panel:  Recent Labs  06/05/12 0507  CHOL 156  HDL 52  LDLCALC 52  TRIG 260*  CHOLHDL 3.0   Thyroid Function Tests: No results found for this basename: TSH, T4TOTAL, FREET3, T3FREE, THYROIDAB,  in the last 72 hours  RADIOLOGY: Dg Chest Port 1 View  06/04/2012  *RADIOLOGY REPORT*  Clinical Data: Chest pain and short of breath  PORTABLE CHEST - 1 VIEW  Comparison: 05/14/2011  Findings: Postop CABG.  Cardiac enlargement without heart failure or effusion.  Negative for pneumonia.  IMPRESSION: No acute abnormality.   Original Report Authenticated By: Carl Best, M.D.     PHYSICAL EXAM  Patient is comfortable in bed. She is lying flat. There is no shortness of breath. She is oriented to person time and place. Affect is normal. She is overweight. There is no jugulovenous distention. Lungs are clear. Respiratory effort is nonlabored. Cardiac exam reveals S1 and S2. There no clicks or significant murmurs. The abdomen is soft. There is no peripheral edema.   TELEMETRY:   I have reviewed telemetry today June 05, 2012. There is marked sinus bradycardia. The rate is between 39 and 45.   ASSESSMENT AND PLAN:  CORONARY ARTERY DISEASE           She has significant coronary disease. At this point there is no proof of an acute coronary syndrome.    Peripheral vascular disease, unspecified             Her peripheral vascular disease is stable at this time.    Hx of CABG    Bradycardia        Bradycardia appears to be the significant issue. She says she has significant fatigue when trying to ambulate. Her blood pressure is stable but her heart rate is low. It is also possible she may have chronotropic incompetence. Her small dose of beta blocker will be stopped. She'll be ambulated in the hall today to see if we can document what happens with her heart rate when she tries to ambulate. TSH will be checked    Chest pain    At this point her chest pain does not appear to represent an acute  coronary syndrome.  Fatigue    She appears to have significant exertional fatigue. This may be related to the bradycardia. This will be assessed further.   Dola Argyle 06/05/2012 8:04 AM

## 2012-06-06 LAB — GLUCOSE, CAPILLARY: Glucose-Capillary: 154 mg/dL — ABNORMAL HIGH (ref 70–99)

## 2012-06-06 MED ORDER — INSULIN GLARGINE 100 UNIT/ML ~~LOC~~ SOLN
30.0000 [IU] | Freq: Every day | SUBCUTANEOUS | Status: DC
  Administered 2012-06-06: 30 [IU] via SUBCUTANEOUS
  Filled 2012-06-06 (×2): qty 0.3

## 2012-06-06 NOTE — Care Management Note (Unsigned)
    Page 1 of 1   06/06/2012     3:04:27 PM   CARE MANAGEMENT NOTE 06/06/2012  Patient:  Destiny [name], Destiny [name]   Account Number:  [id number]  Date Initiated:  06/06/2012  Documentation initiated by:  Terisha Losasso  Subjective/Objective Assessment:   PT ADM ON 06/04/12 WITH CP, MARKED BRADYCARDIA.  PTA, PT RESIDES AT [facility].     Action/Plan:   WILL FOLLOW FOR HOME NEEDS AS PT PROGRESSES.   Anticipated DC Date:  06/07/2012   Anticipated DC Plan:  [place]  CM consult      Choice offered to / List presented to:             Status of service:  In process, will continue to follow Medicare Important Message given?   (If response is "NO", the following Medicare IM given date fields will be blank) Date Medicare IM given:   Date Additional Medicare IM given:    Discharge Disposition:    Per UR Regulation:  Reviewed for med. necessity/level of care/duration of stay  If discussed at [name] of Stay Meetings, dates discussed:    Comments:

## 2012-06-06 NOTE — Progress Notes (Signed)
Resting heart rate 51, ambulated pt in hallway approx. 500 feet, heart rate 72- 88. Pt tolerated well Joylene Draft A

## 2012-06-06 NOTE — Progress Notes (Signed)
Patient ID: Destiny [name], female   DOB: December 20, 1931, 79 y.o.   MRN: [id number]   SUBJECTIVE:  The patient was admitted yesterday. She has a history of severe coronary disease. Her last cath was in 2010. She is status post CABG in the past. 3 of 4 grafts were patent in 2010. The patient also has severe peripheral vascular disease. The patient was admitted with intermittent sharp chest pain. There was also significant bradycardia. 2 troponins are normal so far. There is no diagnostic EKG change.  The patient is not having any significant chest pain today. She had noted significant exertional fatigue on admission. I stopped he aricept last night   Filed Vitals:   06/05/12 0646 06/05/12 1309 06/05/12 2013 06/06/12 0503  BP:  133/46 152/44 148/50  Pulse:  50 49 52  Temp:  98 F (36.7 C) 98 F (36.7 C) 98.4 F (36.9 C)  TempSrc:  Oral Oral Oral  Resp:  16 18 18   Height:      Weight: 165 lb 9.1 oz (75.1 kg)   165 lb 4.8 oz (74.98 kg)  SpO2:  96% 97% 97%    Intake/Output Summary (Last 24 hours) at 06/06/12 [id number] Last data filed at 06/06/12 0500  Gross per 24 hour  Intake    240 ml  Output      5 ml  Net    235 ml    LABS: Basic Metabolic Panel:  Recent Labs  06/04/12 1809 06/05/12 0507  NA 144 143  K 4.1 3.8  CL 113* 108  CO2  --  21  GLUCOSE 96 121*  BUN 27* 23  CREATININE 1.40* 1.21*  CALCIUM  --  9.3   Liver Function Tests: No results found for this basename: AST, ALT, ALKPHOS, BILITOT, PROT, ALBUMIN,  in the last 72 hours No results found for this basename: LIPASE, AMYLASE,  in the last 72 hours CBC:  Recent Labs  06/04/12 1752 06/04/12 1809 06/05/12 0507  WBC 7.2  --  7.1  NEUTROABS 3.7  --   --   HGB 11.6* 10.9* 11.6*  HCT 32.8* 32.0* 32.6*  MCV 87.7  --  85.3  PLT 146*  --  145*   Cardiac Enzymes:  Recent Labs  06/04/12 2122 06/05/12 0507 06/05/12 0755  TROPONINI <0.30 <0.30 <0.30   BNP: No components found with this basename: POCBNP,    D-Dimer: No results found for this basename: DDIMER,  in the last 72 hours Hemoglobin A1C: No results found for this basename: HGBA1C,  in the last 72 hours Fasting Lipid Panel:  Recent Labs  06/05/12 0507  CHOL 156  HDL 52  LDLCALC 52  TRIG 260*  CHOLHDL 3.0   Thyroid Function Tests:  Recent Labs  06/05/12 1000  TSH 1.854    RADIOLOGY: Dg Chest Port 1 View  06/04/2012  *RADIOLOGY REPORT*  Clinical Data: Chest pain and short of breath  PORTABLE CHEST - 1 VIEW  Comparison: 05/14/2011  Findings: Postop CABG.  Cardiac enlargement without heart failure or effusion.  Negative for pneumonia.  IMPRESSION: No acute abnormality.   Original Report Authenticated By: Carl Best, M.D.     PHYSICAL EXAM  Patient is comfortable in bed. She is lying flat. There is no shortness of breath. She is oriented to person time and place. Affect is normal. She is overweight. There is no jugulovenous distention. Lungs are clear. Respiratory effort is nonlabored. Cardiac exam reveals S1 and S2. There no clicks or significant murmurs.  The abdomen is soft. There is no peripheral edema.   TELEMETRY:   I have reviewed telemetry today June 05, 2012. There is marked sinus bradycardia. The rate is between 39 and 45.   ASSESSMENT AND PLAN:    CORONARY ARTERY DISEASE           She has significant coronary disease. At this point there is no proof of an acute coronary syndrome. Continue medical Rx    Peripheral vascular disease, unspecified             Her peripheral vascular disease is stable at this time.    Hx of CABG    Bradycardia        Bradycardia appears to be the significant issue. She says she has significant fatigue when trying to ambulate. Her blood pressure is stable but her heart rate is low. It is also possible she may have chronotropic incompetence. Beta blocker and aricept stopped She'll be ambulated in the hall today to see if we can document what happens with her heart rate when she  tries to ambulate. TSH will be checked    Chest pain    At this point her chest pain does not appear to represent an acute coronary syndrome.  Fatigue    She appears to have significant exertional fatigue. This may be related to the bradycardia. This will be assessed further.  Hopefully will not need pacer. Consider discharge in am if HR above 55 with reasonable elevation when walking   Jenkins Rouge 06/06/2012 8:12 AM

## 2012-06-06 NOTE — Progress Notes (Signed)
Pt C/o Chest Pain, Vital signs and EKG obtained and unremarkable.Pt refused nitroglycerin SL medication and states that she was not in pain any more. Pt made comfortable and  advised to notify the nurse if she starts having any pain. Call light placed within reach, will continue to monitor.

## 2012-06-07 ENCOUNTER — Other Ambulatory Visit: Payer: Self-pay | Admitting: Internal Medicine

## 2012-06-07 ENCOUNTER — Encounter (HOSPITAL_COMMUNITY): Payer: Self-pay | Admitting: Nurse Practitioner

## 2012-06-07 ENCOUNTER — Telehealth: Payer: Self-pay | Admitting: Nurse Practitioner

## 2012-06-07 DIAGNOSIS — I200 Unstable angina: Secondary | ICD-10-CM

## 2012-06-07 DIAGNOSIS — I25810 Atherosclerosis of coronary artery bypass graft(s) without angina pectoris: Secondary | ICD-10-CM | POA: Diagnosis present

## 2012-06-07 LAB — GLUCOSE, CAPILLARY: Glucose-Capillary: 109 mg/dL — ABNORMAL HIGH (ref 70–99)

## 2012-06-07 MED ORDER — ISOSORBIDE MONONITRATE ER 60 MG PO TB24: 60.0000 mg | ORAL_TABLET | Freq: Every day | ORAL | Status: DC

## 2012-06-07 MED ORDER — ASPIRIN 81 MG PO TABS: 81.0000 mg | ORAL_TABLET | Freq: Every day | ORAL | Status: DC

## 2012-06-07 MED ORDER — ISOSORBIDE MONONITRATE ER 60 MG PO TB24
60.0000 mg | ORAL_TABLET | Freq: Every day | ORAL | Status: DC
  Administered 2012-06-07: 60 mg via ORAL
  Filled 2012-06-07: qty 1

## 2012-06-07 NOTE — Progress Notes (Signed)
Patient Name: Destiny [name] Date of Encounter: 06/07/2012   Principal Problem:   Unstable angina Active Problems:   CORONARY ARTERY DISEASE   Hx of CABG   Peripheral vascular disease, unspecified   Bradycardia   HYPERLIPIDEMIA   HYPERTENSION   Diabetes mellitus type 2, insulin dependent   Fatigue    SUBJECTIVE  Brief episode of sharp chest pain last night and again this AM.  Each lasted less than a minute and resolved spontaneously.  Episodes in hospital different from episodes prior to hospitalization.  CURRENT MEDS . amLODipine  10 mg Oral Daily  . aspirin EC  81 mg Oral Daily  . atorvastatin  80 mg Oral q1800  . clopidogrel  75 mg Oral QAC breakfast  . colesevelam  625 mg Oral TID WC  . furosemide  20 mg Oral Daily  . insulin aspart  0-15 Units Subcutaneous TID WC  . insulin glargine  30 Units Subcutaneous QHS  . isosorbide mononitrate  30 mg Oral Daily  . memantine  10 mg Oral BID  . pantoprazole  40 mg Oral Q1200  . quinapril  20 mg Oral QHS  . vitamin B-12  1,000 mcg Oral Daily  . vitamin E  400 Units Oral Daily    OBJECTIVE  Filed Vitals:   06/06/12 1900 06/06/12 2124 06/07/12 0552 06/07/12 0553  BP: 129/59 126/43 139/67   Pulse: 59 46 50   Temp:  97.8 F (36.6 C) 97.6 F (36.4 C)   TempSrc:  Oral Oral   Resp:  19 18   Height:      Weight:    166 lb 7.2 oz (75.5 kg)  SpO2:  98% 100%     Intake/Output Summary (Last 24 hours) at 06/07/12 0749 Last data filed at 06/06/12 0900  Gross per 24 hour  Intake    240 ml  Output      0 ml  Net    240 ml   Filed Weights   06/05/12 0646 06/06/12 0503 06/07/12 0553  Weight: 165 lb 9.1 oz (75.1 kg) 165 lb 4.8 oz (74.98 kg) 166 lb 7.2 oz (75.5 kg)    PHYSICAL EXAM  General: Pleasant, NAD. Neuro: Alert and oriented X 3. Moves all extremities spontaneously. Psych: Normal affect. HEENT:  Normal  Neck: Supple without JVD. Lungs:  Resp regular and unlabored, CTA. Heart: RRR no s3, s4, or  murmurs. Abdomen: Soft, non-tender, non-distended, BS + x 4.  Extremities: No clubbing, cyanosis or edema.  Accessory Clinical Findings  CBC  Recent Labs  06/04/12 1752 06/04/12 1809 06/05/12 0507  WBC 7.2  --  7.1  NEUTROABS 3.7  --   --   HGB 11.6* 10.9* 11.6*  HCT 32.8* 32.0* 32.6*  MCV 87.7  --  85.3  PLT 146*  --  [id number]*   Basic Metabolic Panel  Recent Labs  06/04/12 1809 06/05/12 0507  NA 144 143  K 4.1 3.8  CL 113* 108  CO2  --  21  GLUCOSE 96 121*  BUN 27* 23  CREATININE 1.40* 1.21*  CALCIUM  --  9.3   Cardiac Enzymes  Recent Labs  06/04/12 2122 06/05/12 0507 06/05/12 0755  TROPONINI <0.30 <0.30 <0.30   Fasting Lipid Panel  Recent Labs  06/05/12 0507  CHOL 156  HDL 52  LDLCALC 52  TRIG 260*  CHOLHDL 3.0   Thyroid Function Tests  Recent Labs  06/05/12 1000  TSH 1.854    TELE  Sinus brady,  40's to 50's with rise into 70's with activity.  Radiology/Studies  Dg Chest Port 1 View  06/04/2012  *RADIOLOGY REPORT*  Clinical Data: Chest pain and short of breath  PORTABLE CHEST - 1 VIEW  Comparison: 05/14/2011  Findings: Postop CABG.  Cardiac enlargement without heart failure or effusion.  Negative for pneumonia.  IMPRESSION: No acute abnormality.   Original Report Authenticated By: Carl Best, M.D.    ASSESSMENT AND PLAN  1.  USA/CAD:  Pt presented with exertional chest pressure assoc w/ dyspnea - responsive to ntg, over the course of the previous week.  C/p @ home occurred in several different locations across her chest.  ECG has been non-acute and CE negative.  Since hospitalization, she has had intermittent, sharp/fleeting chest pain, different from what brought her to the hospital.  She had a mildly abnl MV in 12/2011 with a small area of mild mid and apical anterior Belisa Eichholz ischemia.  Last cath in 2010->3/4 patent grafts (occl VG-> Diag, w/ patent LIMA->LAD & VG->OM1->OM3).  Pt prefers continued medical therapy.  Will titrate imdur to 60mg   daily.  Cont asa, statin, plavix, norvasc.  No bb 2/2 baseline bradycardia.  2.  Bradycardia:  Nl chronotropic response with ambulation yesterday.  Nl intervals w/o pauses on tele.  No indication for pacing @ this time.  3.  HTN: stable.  4.  HL:  Cont statin.  LDL 52.  Nl LFT's in Dec.  5.  DM2:  Cont current regimen.  6.  Dispo:  D/c today.  Signed, Murray Hodgkins NP Patient examined and agree except changes made.Discharge today with medical therapy.  Jenell Milliner, MD 06/07/2012 8:14 AM

## 2012-06-07 NOTE — Telephone Encounter (Signed)
Rx request to pharmacy/[facility] *Office Visit Required Prior to Future Refills* 

## 2012-06-07 NOTE — Discharge Summary (Addendum)
Patient ID: Destiny [name],  MRN: [id number], DOB/AGE: January 26, 1932 79 y.o.  Admit date: 06/04/2012 Discharge date: 06/07/2012  Primary Care Provider: Jeb Levering, Philbert Riser Primary Cardiologist: P. Johnsie Cancel, MD  Discharge Diagnoses Principal Problem:   Unstable angina Active Problems:   Hx of CABG   CAD (coronary artery disease)   Peripheral vascular disease, unspecified   Bradycardia   HYPERLIPIDEMIA   HYPERTENSION   Diabetes mellitus type 2, insulin dependent   Fatigue  Allergies No Known Allergies  Procedures  None  History of Present Illness  79 year old female with prior history of CAD status post coronary artery bypass grafting with relatively recent mildly abnormal Myoview in October of 2013, who was in her usual state of health until approximately one week prior to admission when she began to experience intermittent sharp as well as pressure-like exertional chest discomfort, occurring over multiple locations of her chest, radiating to her arms, lasting a few minutes, and resolving with rest and nitrates. She had recurrent discomfort on March 22, that did not resolve after 3 sublingual nitroglycerin tablets at home and she subsequently presented to the ED. In the ED, initial cardiac markers were negative and ECG was nonacute. She was bradycardic with rates in the 40s to 50s and her home dose of beta blocker was held. She was admitted for further evaluation.  Hospital Course  Patient ruled out for myocardial infarction. She did have intermittent sharp and fleeting chest discomfort while at rest during hospitalization. She had no objective evidence of ischemia however. We discussed the possibility of diagnostic catheterization, especially in light of mildly abnormal stress testing last fall. Patient wished to pursue continued medical therapy and we titrated long-acting nitrate to 60 mg daily.   In the setting of baseline bradycardia with rates in the high 40s to 50s at rest,  she was ambulated and did exhibit chronotropic competence with rise in heart rate into the 70s and 80s with ambulation. Her intervals have remained normal and she has not had any evidence of heart block. We have kept her off of beta blocker therapy.  Patient will be discharged home today in good condition. We have arranged for followup in our office within the next 10 days. If she were to continue to experience chest discomfort despite titration of nitrate therapy, we will have to strongly consider diagnostic catheterization.  Discharge Vitals Blood pressure 158/64, pulse 52, temperature 97.6 F (36.4 C), temperature source Oral, resp. rate 18, height 5' 6.5" (1.689 m), weight 166 lb 7.2 oz (75.5 kg), SpO2 100.00%.  Filed Weights   06/05/12 0646 06/06/12 0503 06/07/12 0553  Weight: 165 lb 9.1 oz (75.1 kg) 165 lb 4.8 oz (74.98 kg) 166 lb 7.2 oz (75.5 kg)   Labs  CBC  Recent Labs  06/04/12 1752 06/04/12 1809 06/05/12 0507  WBC 7.2  --  7.1  NEUTROABS 3.7  --   --   HGB 11.6* 10.9* 11.6*  HCT 32.8* 32.0* 32.6*  MCV 87.7  --  85.3  PLT 146*  --  [id number]*   Basic Metabolic Panel  Recent Labs  06/04/12 1809 06/05/12 0507  NA 144 143  K 4.1 3.8  CL 113* 108  CO2  --  21  GLUCOSE 96 121*  BUN 27* 23  CREATININE 1.40* 1.21*  CALCIUM  --  9.3   Cardiac Enzymes  Recent Labs  06/04/12 2122 06/05/12 0507 06/05/12 0755  TROPONINI <0.30 <0.30 <0.30   Fasting Lipid Panel  Recent Labs  06/05/12  0507  CHOL 156  HDL 52  LDLCALC 52  TRIG 260*  CHOLHDL 3.0   Thyroid Function Tests  Recent Labs  06/05/12 1000  TSH 1.854   Disposition  Pt is being discharged home today in good condition.  Follow-up Plans & Appointments      Follow-up Information   Follow up with Ermalinda Barrios, PA-C On 06/15/2012. (10:15 AM - Dr. Kyla Balzarine PA)    Contact information:   [id number] [place] [address] [phone]       Follow up with Jeb Levering, Philbert Riser,  MD. (as scheduled.)    Contact information:   [address] [place] [address] [phone]     Discharge Medications    Medication List    STOP taking these medications       donepezil 10 MG tablet  Commonly known as:  ARICEPT     metoprolol succinate 50 MG 24 hr tablet  Commonly known as:  TOPROL-XL     vitamin E 400 UNIT capsule      TAKE these medications       amLODipine 10 MG tablet  Commonly known as:  NORVASC  Take 10 mg by mouth daily.     aspirin 81 MG tablet  Take 1 tablet (81 mg total) by mouth daily.     CENTRUM SILVER PO  Take 1 tablet by mouth daily.     clopidogrel 75 MG tablet  Commonly known as:  PLAVIX  Take 75 mg by mouth daily.     colesevelam 625 MG tablet  Commonly known as:  WELCHOL  Take 625 mg by mouth 3 (three) times daily.     Fish Oil 1200 MG Caps  Take 1 capsule by mouth daily.     furosemide 20 MG tablet  Commonly known as:  LASIX  Take 20 mg by mouth daily.     Glucosamine Chondr 500 Complex Caps  Take 1 capsule by mouth daily.     insulin glargine 100 units/mL Soln  Commonly known as:  LANTUS  Inject 30 Units into the skin daily at 10 pm.     insulin lispro 100 UNIT/ML injection  Commonly known as:  HUMALOG  Inject 5-15 Units into the skin 3 (three) times daily before meals. Sliding scale     Iron 325 (65 FE) MG Tabs  Take 325 mg by mouth 2 (two) times daily.     isosorbide mononitrate 60 MG 24 hr tablet  Commonly known as:  IMDUR  Take 1 tablet (60 mg total) by mouth daily.     memantine 10 MG tablet  Commonly known as:  NAMENDA  Take 10 mg by mouth 2 (two) times daily.     nitroGLYCERIN 0.4 MG SL tablet  Commonly known as:  NITROSTAT  Place 0.4 mg under the tongue every 5 (five) minutes as needed for chest pain. Chest pain     pantoprazole 40 MG tablet  Commonly known as:  PROTONIX  Take 40 mg by mouth daily.     quinapril 40 MG tablet  Commonly known as:  ACCUPRIL  Take 20 mg by mouth at  bedtime.     rosuvastatin 20 MG tablet  Commonly known as:  CRESTOR  Take 20 mg by mouth daily.     vitamin B-12 1000 MCG tablet  Commonly known as:  CYANOCOBALAMIN  Take 1,000 mcg by mouth daily.     Vitamin D 1000 UNITS capsule  Take  1,000 Units by mouth daily.      Outstanding Labs/Studies  None  Duration of Discharge Encounter   Greater than 30 minutes including physician time.  Signed, Murray Hodgkins NP 06/07/2012, 12:25 PM   Marijo Conception. Verl Blalock, MD, [place] Pager:  [phone]

## 2012-06-07 NOTE — Telephone Encounter (Signed)
Spoke to patient she is feeling better.States she understands discharge instructions and medications.Advised to keep appointment with Ermalinda Barrios PA 06/15/12 at 10:15 am.

## 2012-06-07 NOTE — Telephone Encounter (Signed)
New Problem:     I scheduled a 14 day TOC appointment with Ermalinda Barrios on 06/15/12 @ 10:15 a.m per Ignacia Bayley PA.

## 2012-06-07 NOTE — Discharge Instructions (Signed)
***  PLEASE REMEMBER TO BRING ALL OF YOUR MEDICATIONS TO EACH OF YOUR FOLLOW-UP OFFICE VISITS.  

## 2012-06-07 NOTE — Telephone Encounter (Signed)
Patient called no answer.LMTC. 

## 2012-06-09 ENCOUNTER — Ambulatory Visit (INDEPENDENT_AMBULATORY_CARE_PROVIDER_SITE_OTHER): Payer: Medicare Other | Admitting: Family

## 2012-06-09 ENCOUNTER — Encounter: Payer: Self-pay | Admitting: Family

## 2012-06-09 VITALS — BP 130/70 | HR 48 | Temp 97.8°F | Resp 16 | Ht 66.0 in | Wt 167.0 lb

## 2012-06-09 DIAGNOSIS — Z794 Long term (current) use of insulin: Secondary | ICD-10-CM

## 2012-06-09 DIAGNOSIS — E119 Type 2 diabetes mellitus without complications: Secondary | ICD-10-CM

## 2012-06-09 DIAGNOSIS — IMO0001 Reserved for inherently not codable concepts without codable children: Secondary | ICD-10-CM

## 2012-06-09 DIAGNOSIS — I200 Unstable angina: Secondary | ICD-10-CM

## 2012-06-09 DIAGNOSIS — E538 Deficiency of other specified B group vitamins: Secondary | ICD-10-CM

## 2012-06-09 DIAGNOSIS — N390 Urinary tract infection, site not specified: Secondary | ICD-10-CM

## 2012-06-09 DIAGNOSIS — R300 Dysuria: Secondary | ICD-10-CM

## 2012-06-09 LAB — POCT URINALYSIS DIPSTICK
Protein, UA: 300
Spec Grav, UA: 1.02
Urobilinogen, UA: 0.2

## 2012-06-09 MED ORDER — CIPROFLOXACIN HCL 250 MG PO TABS: 250.0000 mg | ORAL_TABLET | Freq: Two times a day (BID) | ORAL | Status: DC

## 2012-06-09 NOTE — Assessment & Plan Note (Signed)
Mild anemia but stable. Will check B12 level.

## 2012-06-09 NOTE — Patient Instructions (Addendum)
Please complete lab work prior to leaving.  Call if your urinary symptoms worsen or if not improvement.  Follow up in 3 months.

## 2012-06-09 NOTE — Assessment & Plan Note (Signed)
Stable since discharge. Recommended that pt keep upcoming appointment with cardiology.

## 2012-06-09 NOTE — Assessment & Plan Note (Signed)
Uncontrolled per report.  Obtain A1C, urine microalbumin.  Continue insulin.  Plan to adjust insulin based on A1C.

## 2012-06-09 NOTE — Progress Notes (Signed)
Subjective:    Patient ID: Destiny [name], female    DOB: [birth year], 79 y.o.   MRN: [id number]  HPI  Destiny [name] is a 79 yr old female who presents today for routine follow up.  She was however discharged form the hospital on 3/25 for an episode of unstable angina.  Records are reviewed.  The pt ruled out for MI.  She has scheduled follow up on 06/15/12 with cardiology.  She reports that she had a hard day yesterday with brief episode of chest pain which resolved own.  She did not feel that she needed to take a nitro tab.   DM2- She checks her sugars at home. Report this AM sugar was 190.  She is using lantus 30 units HS and humalog by sliding scale TID AC meals.    Anemia- hemoglobin was noted to be 11.6 while in the hospital and normocytic.  She denies black/bloody stools.    Dysuria- reports that she has been having occasional dysuria which is intermittent.    Review of Systems    see HPI  Past Medical History  Diagnosis Date  . PVD (peripheral vascular disease)   . CAD (coronary artery disease)     a. s/p CABG x 4 (LIMA->LAD, VG->Diag, VG->OM1->OM3);  b. 2010 Cath: 3/4 patent grafts (VG->diag occluded), 3vd, sev PVD ->Med Rx;  c. 12/2011 Lexi MV: sm area of mild mid and apical ant wall ischemia ->med rx.  . Hypertension   . Hyperlipidemia   . Duodenal ulcer 2009 and 11/2011  . Iron deficiency anemia secondary to blood loss (chronic)   . Amaurosis fugax   . Carotid bruit   . Diabetes mellitus type II   . Dementia     pt denies  . Diabetic retinopathy(362.0)   . Gallstones 2009    seen on CT scan  . Diverticulosis   . Atherosclerosis   . Renal insufficiency   . CVA (cerebral infarction)   . Diabetic peripheral neuropathy   . GI bleed 2011/05/15, 11/2011    Cause not defined.   . Colon polyp     History   Social History  . Marital Status: Widowed    Spouse Name: N/A    Number of Children: 4  . Years of Education: N/A   Occupational History  . retired    Social  History Main Topics  . Smoking status: Former Smoker    Types: Cigarettes    Quit date: 12/31/2000  . Smokeless tobacco: Never Used  . Alcohol Use: No  . Drug Use: No  . Sexually Active: Not on file   Other Topics Concern  . Not on file   Social History Narrative   Widowed - husband passed away in May 14, 2009   lives with daughter with epilepsy - mental retardation   Retired     Former Smoker      Alcohol use-no          Occupation: Retired       son - Destiny [name]     Past Surgical History  Procedure Laterality Date  . Coronary artery bypass graft  1999    x 4  . Abdominal hysterectomy    . Upper gastrointestinal endoscopy  02/22/2008    w/biopsy, duedenal ulcer, antrum erosions  . Esophagogastroduodenoscopy  05/16/2011    Procedure: ESOPHAGOGASTRODUODENOSCOPY (EGD);  Surgeon: Gatha Mayer, MD;  Location: [facility] ENDOSCOPY;  Service: Endoscopy;  Laterality: N/A;  . Colonoscopy  05/20/2011  Procedure: COLONOSCOPY;  Surgeon: Scarlette Shorts, MD;  Location: [place];  Service: Endoscopy;  Laterality: N/A;  . Esophagogastroduodenoscopy  12/08/2011    Procedure: ESOPHAGOGASTRODUODENOSCOPY (EGD);  Surgeon: Ladene Artist, MD,FACG;  Location: [facility] ENDOSCOPY;  Service: Endoscopy;  Laterality: N/A;    Family History  Problem Relation Age of Onset  . Coronary artery disease Father   . Hypertension Father   . Stroke Father   . Cancer Mother     ?  . Diabetes Maternal Uncle   . Colon cancer Neg Hx     No Known Allergies  Current Outpatient Prescriptions on File Prior to Visit  Medication Sig Dispense Refill  . amLODipine (NORVASC) 10 MG tablet Take 10 mg by mouth daily.      Marland Kitchen aspirin 81 MG tablet Take 1 tablet (81 mg total) by mouth daily.  30 tablet    . Cholecalciferol (VITAMIN D) 1000 UNITS capsule Take 1,000 Units by mouth daily.       . clopidogrel (PLAVIX) 75 MG tablet Take 75 mg by mouth daily.      . colesevelam (WELCHOL) 625 MG tablet Take 625 mg by mouth 3 (three) times  daily.       . CRESTOR 20 MG tablet TAKE 1 TABLET BY MOUTH AT BEDTIME.  90 tablet  0  . Ferrous Sulfate (IRON) 325 (65 FE) MG TABS Take 325 mg by mouth 2 (two) times daily.       . furosemide (LASIX) 20 MG tablet Take 20 mg by mouth daily.      . Glucosamine-Chondroit-Vit C-Mn (GLUCOSAMINE CHONDR 500 COMPLEX) CAPS Take 1 capsule by mouth daily.       . insulin glargine (LANTUS) 100 units/mL SOLN Inject 30 Units into the skin daily at 10 pm.      . insulin lispro (HUMALOG) 100 UNIT/ML injection Inject 5-15 Units into the skin 3 (three) times daily before meals. Sliding scale      . isosorbide mononitrate (IMDUR) 60 MG 24 hr tablet Take 1 tablet (60 mg total) by mouth daily.  30 tablet  6  . memantine (NAMENDA) 10 MG tablet Take 10 mg by mouth 2 (two) times daily.      . Multiple Vitamins-Minerals (CENTRUM SILVER PO) Take 1 tablet by mouth daily.       Marland Kitchen NAMENDA 10 MG tablet TAKE 1 TABLET (10 MG TOTAL) BY MOUTH 2 (TWO) TIMES DAILY.  60 tablet  0  . nitroGLYCERIN (NITROSTAT) 0.4 MG SL tablet Place 0.4 mg under the tongue every 5 (five) minutes as needed for chest pain. Chest pain      . Omega-3 Fatty Acids (FISH OIL) 1200 MG CAPS Take 1 capsule by mouth daily.      . pantoprazole (PROTONIX) 40 MG tablet Take 40 mg by mouth daily.      . quinapril (ACCUPRIL) 40 MG tablet Take 20 mg by mouth at bedtime.      . rosuvastatin (CRESTOR) 20 MG tablet Take 20 mg by mouth daily.      . vitamin B-12 (CYANOCOBALAMIN) 1000 MCG tablet Take 1,000 mcg by mouth daily.       No current facility-administered medications on file prior to visit.    BP 130/70  Pulse 48  Temp(Src) 97.8 F (36.6 C) (Oral)  Resp 16  Ht 5\' 6"  (QA348G m)  Wt 167 lb 0.6 oz (75.769 kg)  BMI 26.97 kg/m2  SpO2 98%    Objective:   Physical Exam  Constitutional:  She is oriented to person, place, and time. She appears well-developed and well-nourished. No distress.  HENT:  Head: Normocephalic and atraumatic.  Cardiovascular: Normal  rate and regular rhythm.   No murmur heard. Pulmonary/Chest: Effort normal and breath sounds normal. No respiratory distress. She has no wheezes. She has no rales. She exhibits no tenderness.  Abdominal: Soft. Bowel sounds are normal. She exhibits no distension and no mass. There is no tenderness. There is no rebound and no guarding.  Neurological: She is alert and oriented to person, place, and time.  Skin: Skin is warm and dry.  Psychiatric: She has a normal mood and affect. Her behavior is normal. Judgment and thought content normal.          Assessment & Plan:

## 2012-06-09 NOTE — Assessment & Plan Note (Signed)
UA suggests UTI.  Will rx with cipro and send urine for culture.

## 2012-06-10 LAB — HEMOGLOBIN A1C
Hgb A1c MFr Bld: 7.8 % — ABNORMAL HIGH (ref <5.7)
Mean Plasma Glucose: 177 mg/dL — ABNORMAL HIGH (ref <117)

## 2012-06-10 LAB — MICROALBUMIN / CREATININE URINE RATIO
Creatinine, Urine: 148.8 mg/dL
Microalb Creat Ratio: 1266.1 mg/g — ABNORMAL HIGH (ref 0.0–30.0)
Microalb, Ur: 188.4 mg/dL — ABNORMAL HIGH (ref 0.00–1.89)

## 2012-06-11 LAB — URINE CULTURE: Colony Count: >=100000

## 2012-06-15 ENCOUNTER — Ambulatory Visit (INDEPENDENT_AMBULATORY_CARE_PROVIDER_SITE_OTHER): Payer: Medicare Other | Admitting: Physician Assistant

## 2012-06-15 ENCOUNTER — Encounter: Payer: Self-pay | Admitting: Physician Assistant

## 2012-06-15 VITALS — BP 132/64 | HR 57 | Ht 66.5 in | Wt 170.0 lb

## 2012-06-15 DIAGNOSIS — R079 Chest pain, unspecified: Secondary | ICD-10-CM

## 2012-06-15 DIAGNOSIS — R001 Bradycardia, unspecified: Secondary | ICD-10-CM

## 2012-06-15 DIAGNOSIS — I2510 Atherosclerotic heart disease of native coronary artery without angina pectoris: Secondary | ICD-10-CM

## 2012-06-15 DIAGNOSIS — I498 Other specified cardiac arrhythmias: Secondary | ICD-10-CM

## 2012-06-15 NOTE — Progress Notes (Signed)
HPI:   This is a 79 year old white female patient Dr.Nishan who has history of coronary artery disease status post CABG. I saw her back and neck toe burn at which time she was having some chest pain and underwent stress Myoview which showed a small area of mild mid and apical anterior wall ischemia ejection fraction 72%. She was treated medically. Her last cardiac catheterization was in 10/10 that showed 3 or 4 grafts patent and normal LV function.  The patient was admitted to the hospital on 06/04/12 with recurrent chest pain. Some of it was atypical but other somewhat typical. She ruled out for an MI. Diagnostic cardiac catheterization was discussed. She had mild bleeding abnormal stress test in the fall and wished to pursue medical therapy. Her Imdur was increased to 60 mg daily. She also has baseline bradycardia and she was kept off her beta blocker.she also has mild renal insufficiency with creatinine of 1.2 and 1.4 in the hospital.  Since the patient's been home she's had occasional chest pain. Some as described by quick dull ache that comes and goes within seconds. Last night while sitting in her chair she had mild chest pressure that only lasted a few seconds. She can't tell if it's indigestion or cardiac. She was busy ironing her son's shirts prior to this episode. She is slowly growing gaining her strength back but does feel weak.  No Known Allergies  Current Outpatient Prescriptions on File Prior to Visit: amLODipine (NORVASC) 10 MG tablet, Take 10 mg by mouth daily., Disp: , Rfl:   aspirin 81 MG tablet, Take 1 tablet (81 mg total) by mouth daily., Disp: 30 tablet, Rfl:  Cholecalciferol (VITAMIN D) 1000 UNITS capsule, Take 1,000 Units by mouth daily. , Disp: , Rfl:  ciprofloxacin (CIPRO) 250 MG tablet, Take 1 tablet (250 mg total) by mouth 2 (two) times daily., Disp: 10 tablet, Rfl: 0 clopidogrel (PLAVIX) 75 MG tablet, Take 75 mg by mouth daily., Disp: , Rfl:   colesevelam (WELCHOL) 625 MG  tablet, Take 625 mg by mouth 3 (three) times daily. , Disp: , Rfl:  CRESTOR 20 MG tablet, TAKE 1 TABLET BY MOUTH AT BEDTIME., Disp: 90 tablet, Rfl: 0 Ferrous Sulfate (IRON) 325 (65 FE) MG TABS, Take 325 mg by mouth 2 (two) times daily. , Disp: , Rfl:  furosemide (LASIX) 20 MG tablet, Take 20 mg by mouth daily., Disp: , Rfl:  Glucosamine-Chondroit-Vit C-Mn (GLUCOSAMINE CHONDR 500 COMPLEX) CAPS, Take 1 capsule by mouth daily. , Disp: , Rfl:  insulin glargine (LANTUS) 100 units/mL SOLN, Inject 30 Units into the skin daily at 10 pm., Disp: , Rfl:  insulin lispro (HUMALOG) 100 UNIT/ML injection, Inject 5-15 Units into the skin 3 (three) times daily before meals. Sliding scale, Disp: , Rfl:  isosorbide mononitrate (IMDUR) 60 MG 24 hr tablet, Take 1 tablet (60 mg total) by mouth daily., Disp: 30 tablet, Rfl: 6 memantine (NAMENDA) 10 MG tablet, Take 10 mg by mouth 2 (two) times daily., Disp: , Rfl:  Multiple Vitamins-Minerals (CENTRUM SILVER PO), Take 1 tablet by mouth daily. , Disp: , Rfl:  NAMENDA 10 MG tablet, TAKE 1 TABLET (10 MG TOTAL) BY MOUTH 2 (TWO) TIMES DAILY., Disp: 60 tablet, Rfl: 0 nitroGLYCERIN (NITROSTAT) 0.4 MG SL tablet, Place 0.4 mg under the tongue every 5 (five) minutes as needed for chest pain. Chest pain, Disp: , Rfl:  Omega-3 Fatty Acids (FISH OIL) 1200 MG CAPS, Take 1 capsule by mouth daily., Disp: , Rfl:  pantoprazole (PROTONIX) 40  MG tablet, Take 40 mg by mouth daily., Disp: , Rfl:  quinapril (ACCUPRIL) 40 MG tablet, Take 20 mg by mouth at bedtime., Disp: , Rfl:  rosuvastatin (CRESTOR) 20 MG tablet, Take 20 mg by mouth daily., Disp: , Rfl:  vitamin B-12 (CYANOCOBALAMIN) 1000 MCG tablet, Take 1,000 mcg by mouth daily., Disp: , Rfl:   No current facility-administered medications on file prior to visit.   Past Medical History:   PVD (peripheral vascular disease)                            CAD (coronary artery disease)                                  Comment:a. s/p CABG x 4  (LIMA->LAD, VG->Diag,               VG->OM1->OM3);  b. 2010 Cath: 3/4 patent grafts              (VG->diag occluded), 3vd, sev PVD ->Med Rx;  c.              12/2011 Lexi MV: sm area of mild mid and apical              ant wall ischemia ->med rx.   Hypertension                                                 Hyperlipidemia                                               Duodenal ulcer                                  2009 and *   Iron deficiency anemia secondary to blood loss*              Amaurosis fugax                                              Carotid bruit                                                Diabetes mellitus type II                                    Dementia                                                       Comment:pt denies   Diabetic retinopathy(362.0)  Gallstones                                      2009           Comment:seen on CT scan   Diverticulosis                                               Atherosclerosis                                              Renal insufficiency                                          CVA (cerebral infarction)                                    Diabetic peripheral neuropathy                               GI bleed                                        04/2011, 9*     Comment:Cause not defined.    Colon polyp                                                 Past Surgical History:   CORONARY ARTERY BYPASS GRAFT                     1999           Comment:x 4   ABDOMINAL HYSTERECTOMY                                        UPPER GASTROINTESTINAL ENDOSCOPY                 02/22/2008      Comment:w/biopsy, duedenal ulcer, antrum erosions   ESOPHAGOGASTRODUODENOSCOPY                       05/16/2011       Comment:Procedure: ESOPHAGOGASTRODUODENOSCOPY (EGD);                Surgeon: Gatha Mayer, MD;  [facility]               ENDOSCOPY;  Service: Endoscopy;  Laterality:               N/A;    COLONOSCOPY  05/20/2011       Comment:Procedure: COLONOSCOPY;  Surgeon: Scarlette Shorts,               MD;  Location: [facility] ENDOSCOPY;  Service:               Endoscopy;  Laterality: N/A;   ESOPHAGOGASTRODUODENOSCOPY                       12/08/2011      Comment:Procedure: ESOPHAGOGASTRODUODENOSCOPY (EGD);                Surgeon: Ladene Artist, MD,FACG;  Location:               [facility] ENDOSCOPY;  Service: Endoscopy;  Laterality:              N/A;  Review of patient's family history indicates:   Coronary artery disease        Father                   Hypertension                   Father                   Stroke                         Father                   Cancer                         Mother                     Comment: ?   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                                   Social History Main Topics   Smoking Status: Former Smoker                   Packs/Day: 0.00  Years:           Types: Cigarettes     Quit date: 12/31/2000   Smokeless Status: Never Used  Alcohol Use: No             Drug Use: No             Sexual Activity: Not on file        Other Topics            Concern   None on file  Social History Narrative   Widowed - husband passed away in Jun 10, 2009   lives with daughter with epilepsy - mental retardation   Retired     Former Smoker      Alcohol use-no          Occupation: Retired       son - Bayah Wingate     ROS:see history of present illness   PHYSICAL EXAM: Well-nournished, in no acute distress. Neck: No JVD, HJR, Bruit, or thyroid enlargement  Lungs: No tachypnea,  clear without wheezing, rales, or rhonchi  Cardiovascular: RRR, PMI not displaced, positive S4 and 2/6 systolic murmur at the left sternal border, no bruit, thrill, or heave.  Abdomen: BS normal. Soft without organomegaly, masses, lesions or tenderness.  Extremities: without cyanosis, clubbing or edema. Good distal pulses bilateral  SKin: Warm, no lesions or rashes   Musculoskeletal: No deformities  Neuro: no focal signs  BP 132/64  Pulse 57  Ht 5' 6.5" (1.689 m)  Wt 170 lb (77.111 kg)  BMI 27.03 kg/m2  SpO2 94%   [id number] bradycardia 57 beats per minute with incomplete right bundle branch block and nonspecific ST-T wave changes   Impression10/2013 Exercise Capacity:  Lexiscan with no exercise. BP Response:  Normal blood pressure response. Clinical Symptoms:  There is dyspnea. ECG Impression:  No significant ST segment change suggestive of ischemia. Comparison with Prior Nuclear Study: No images to compare  Overall Impression:  Small area of mild mid and apical anterior wall ischemia  LV Ejection Fraction: 72%.  LV Wall Motion:  NL LV Function; NL Wall Motion   Jenkins Rouge

## 2012-06-15 NOTE — Assessment & Plan Note (Signed)
Patient continues to have sinus bradycardia off her beta blocker. We'll continue to monitor.

## 2012-06-15 NOTE — Patient Instructions (Addendum)
**Note De-Identified  Obfuscation** Your physician recommends that you continue on your current medications as directed. Please refer to the Current Medication list given to you today.  Your physician recommends that you schedule a follow-up appointment in: 1 to 2 months

## 2012-06-15 NOTE — Assessment & Plan Note (Signed)
Patient had recent admission with chest pain and ruled out for an MI. She wanted to pursue medical therapy which is reasonable with a mildly abnormal stress Myoview in October 2013 and chronic renal insufficiency.

## 2012-06-15 NOTE — Assessment & Plan Note (Signed)
Patient continues to have chest pain off-and-on it seems to be atypical. It is improving according to the patient. I will not change any medications today. She is asked to call since she has worsening symptoms. We will continue medical therapy at this time.

## 2012-06-21 ENCOUNTER — Other Ambulatory Visit: Payer: Self-pay | Admitting: Internal Medicine

## 2012-06-21 NOTE — Telephone Encounter (Signed)
Rx request to pharmacy/[facility]  

## 2012-06-29 ENCOUNTER — Other Ambulatory Visit: Payer: Self-pay | Admitting: Family

## 2012-06-29 ENCOUNTER — Other Ambulatory Visit: Payer: Self-pay | Admitting: Internal Medicine

## 2012-06-29 NOTE — Telephone Encounter (Signed)
Rx request to pharmacy/[facility]  

## 2012-07-05 ENCOUNTER — Other Ambulatory Visit: Payer: Self-pay | Admitting: Family

## 2012-07-28 ENCOUNTER — Encounter: Payer: Self-pay | Admitting: Cardiovascular Disease

## 2012-07-28 ENCOUNTER — Ambulatory Visit (INDEPENDENT_AMBULATORY_CARE_PROVIDER_SITE_OTHER): Payer: Medicare Other | Admitting: Cardiovascular Disease

## 2012-07-28 VITALS — BP 140/70 | HR 62 | Ht 66.0 in | Wt 170.0 lb

## 2012-07-28 DIAGNOSIS — E785 Hyperlipidemia, unspecified: Secondary | ICD-10-CM

## 2012-07-28 DIAGNOSIS — I2510 Atherosclerotic heart disease of native coronary artery without angina pectoris: Secondary | ICD-10-CM

## 2012-07-28 DIAGNOSIS — I10 Essential (primary) hypertension: Secondary | ICD-10-CM

## 2012-07-28 NOTE — Patient Instructions (Signed)
Your physician wants you to follow-up in:  6 MONTHS WITH DR NISHAN  You will receive a reminder letter in the mail two months in advance. If you don't receive a letter, please call our office to schedule the follow-up appointment. Your physician recommends that you continue on your current medications as directed. Please refer to the Current Medication list given to you today. 

## 2012-07-28 NOTE — Progress Notes (Signed)
Patient ID: Destiny [name], female   DOB: August 30, 1931, 79 y.o.   MRN: [id number] This is a 79 year old white female patient Dr.Nishan who has history of coronary artery disease status post CABG. I saw her back and neck toe burn at which time she was having some chest pain and underwent stress Myoview which showed a small area of mild mid and apical anterior wall ischemia ejection fraction 72%. She was treated medically. Her last cardiac catheterization was in 10/10 that showed 3 or 4 grafts patent and normal LV function.  The patient was admitted to the hospital on 06/04/12 with recurrent chest pain. Some of it was atypical but other somewhat typical. She ruled out for an MI. Diagnostic cardiac catheterization was discussed. She had mildly abnormal  stress test in the fall and wished to pursue medical therapy. Her Imdur was increased to 60 mg daily. She also has baseline bradycardia and she was kept off her beta blocker.she also has mild renal insufficiency with creatinine of 1.2 and 1.4 in the hospital.   She has not had any chest pains. She is compliant with her meds. She has a daughter who is "slow" that lives with her  ROS: Denies fever, malais, weight loss, blurry vision, decreased visual acuity, cough, sputum, SOB, hemoptysis, pleuritic pain, palpitaitons, heartburn, abdominal pain, melena, lower extremity edema, claudication, or rash.  All other systems reviewed and negative  General: Affect appropriate Healthy:  appears stated age 78: normal Neck supple with no adenopathy JVP normal no bruits no thyromegaly Lungs clear with no wheezing and good diaphragmatic motion Heart:  S1/S2 no murmur, no rub, gallop or click PMI normal Abdomen: benighn, BS positve, no tenderness, no AAA no bruit.  No HSM or HJR Distal pulses intact with no bruits No edema Neuro non-focal Skin warm and dry No muscular weakness   Current Outpatient Prescriptions  Medication Sig Dispense Refill  . amLODipine  (NORVASC) 10 MG tablet Take 10 mg by mouth daily.      Marland Kitchen aspirin 81 MG tablet Take 1 tablet (81 mg total) by mouth daily.  30 tablet    . Cholecalciferol (VITAMIN D) 1000 UNITS capsule Take 1,000 Units by mouth daily.       . ciprofloxacin (CIPRO) 250 MG tablet Take 1 tablet (250 mg total) by mouth 2 (two) times daily.  10 tablet  0  . clopidogrel (PLAVIX) 75 MG tablet Take 75 mg by mouth daily.      . colesevelam (WELCHOL) 625 MG tablet Take 625 mg by mouth 3 (three) times daily.       . CRESTOR 20 MG tablet TAKE 1 TABLET BY MOUTH AT BEDTIME.  90 tablet  0  . Ferrous Sulfate (IRON) 325 (65 FE) MG TABS Take 325 mg by mouth 2 (two) times daily.       . furosemide (LASIX) 20 MG tablet Take 20 mg by mouth daily.      . Glucosamine-Chondroit-Vit C-Mn (GLUCOSAMINE CHONDR 500 COMPLEX) CAPS Take 1 capsule by mouth daily.       . insulin glargine (LANTUS) 100 units/mL SOLN Inject 30 Units into the skin daily at 10 pm.      . insulin lispro (HUMALOG) 100 UNIT/ML injection Inject 5-15 Units into the skin 3 (three) times daily before meals. Sliding scale      . isosorbide mononitrate (IMDUR) 60 MG 24 hr tablet Take 1 tablet (60 mg total) by mouth daily.  30 tablet  6  . memantine (NAMENDA) 10  MG tablet Take 10 mg by mouth 2 (two) times daily.      . nitroGLYCERIN (NITROSTAT) 0.4 MG SL tablet Place 0.4 mg under the tongue every 5 (five) minutes as needed for chest pain. Chest pain      . Omega-3 Fatty Acids (FISH OIL) 1200 MG CAPS Take 1 capsule by mouth daily.      . pantoprazole (PROTONIX) 40 MG tablet Take 40 mg by mouth daily.      . quinapril (ACCUPRIL) 40 MG tablet Take 20 mg by mouth at bedtime.      . vitamin B-12 (CYANOCOBALAMIN) 1000 MCG tablet Take 1,000 mcg by mouth daily.      . WELCHOL 625 MG tablet TAKE 3 TABLETS BY MOUTH DAILY.  180 tablet  1   No current facility-administered medications for this visit.    Allergies  Review of patient's allergies indicates no known  allergies.  Electrocardiogram:  06/15/12  SR rate 57 [id number] nonspecfic T wave changes   Assessment and Plan

## 2012-07-28 NOTE — Assessment & Plan Note (Signed)
Cholesterol is at goal.  Continue current dose of statin and diet Rx.  No myalgias or side effects.  F/U  LFT's in 6 months. Lab Results  Component Value Date   LDLCALC 52 06/05/2012

## 2012-07-28 NOTE — Assessment & Plan Note (Signed)
Well controlled.  Continue current medications and low sodium Dash type diet.    

## 2012-07-28 NOTE — Assessment & Plan Note (Signed)
Stable with no angina and good activity level.  Continue medical Rx  

## 2012-08-09 ENCOUNTER — Other Ambulatory Visit: Payer: Self-pay | Admitting: Family

## 2012-08-09 ENCOUNTER — Other Ambulatory Visit: Payer: Self-pay | Admitting: Internal Medicine

## 2012-08-09 MED ORDER — INSULIN LISPRO 100 UNIT/ML ~~LOC~~ SOLN: 5.0000 [IU] | Freq: Three times a day (TID) | SUBCUTANEOUS | Status: DC

## 2012-08-09 MED ORDER — COLESEVELAM HCL 625 MG PO TABS: 1875.0000 mg | ORAL_TABLET | Freq: Every day | ORAL | Status: DC

## 2012-08-09 MED ORDER — MEMANTINE HCL 10 MG PO TABS: 10.0000 mg | ORAL_TABLET | Freq: Two times a day (BID) | ORAL | Status: DC

## 2012-08-09 MED ORDER — ISOSORBIDE MONONITRATE ER 60 MG PO TB24: 60.0000 mg | ORAL_TABLET | Freq: Every day | ORAL | Status: DC

## 2012-08-09 NOTE — Telephone Encounter (Signed)
Received refill request from pharmacy for accupril 40mg  1 tablet daily. Current med list states 40mg  1/2 tablet daily. Verified with pt that she is taking per current med list. Refill sent.

## 2012-08-09 NOTE — Telephone Encounter (Signed)
So I agree Donepezil looks to have been discontinued when she was released from the hospital in March 2014. Check with patient to make sure she is not taking the meds

## 2012-08-09 NOTE — Telephone Encounter (Signed)
namenda 10 mg  Isosorbide 60 mg welchol 625 mg Donepezil 10 mg  humalog vial  Needs new 90 day rx on all of the above for [facility] to put on file so they can get her on a schedule for her pick up person to be able to come less

## 2012-08-09 NOTE — Telephone Encounter (Signed)
Please advise Donepezil refill? I see that the chart states its ineffective and to quit taking?

## 2012-08-10 ENCOUNTER — Telehealth: Payer: Self-pay | Admitting: Internal Medicine

## 2012-08-10 MED ORDER — PANTOPRAZOLE SODIUM 40 MG PO TBEC: 40.0000 mg | DELAYED_RELEASE_TABLET | Freq: Every day | ORAL | Status: DC

## 2012-08-10 NOTE — Telephone Encounter (Signed)
Pt states she is not sure if she is taking the Donazepil or not? Pt states she lays out all of her medication on Monday. Pt will call us on Monday if she realizes she is taking this

## 2012-08-10 NOTE — Telephone Encounter (Signed)
Refill- pantoprazole sod dr 40mg  tablets. Take one tablet by mouth once daily 30 minutes before a meal. Qty 90 last fill 5.23.14

## 2012-08-11 ENCOUNTER — Telehealth: Payer: Self-pay | Admitting: *Deleted

## 2012-08-11 MED ORDER — INSULIN LISPRO 100 UNIT/ML (KWIKPEN): PEN_INJECTOR | SUBCUTANEOUS | Status: DC

## 2012-08-11 NOTE — Telephone Encounter (Signed)
Received fax from [facility] stating pt has been getting humalog kwik pen in a 90 day supply.  Pt requests to change to [place] and 3 boxes. Rx called to [place].

## 2012-08-18 ENCOUNTER — Other Ambulatory Visit: Payer: Self-pay | Admitting: Internal Medicine

## 2012-08-19 NOTE — Telephone Encounter (Signed)
Pharmacy called back for clarification of Lantus rx. Gave ok to dispense 3 month supply x 1 refills.

## 2012-08-22 ENCOUNTER — Telehealth: Payer: Self-pay

## 2012-08-22 NOTE — Telephone Encounter (Signed)
We are receiving paperwork from [place] for diabetic supplies.  I asked pt if she is wanting these and pt stated no. I will fax back with pt doesn't want and to stop faxing [place].

## 2012-08-25 ENCOUNTER — Telehealth: Payer: Self-pay | Admitting: *Deleted

## 2012-08-25 NOTE — Telephone Encounter (Signed)
Received fax from [facility] requesting order for diabetic testing supplies. Form completed and faxed to [phone].

## 2012-09-05 ENCOUNTER — Encounter: Payer: Self-pay | Admitting: Family

## 2012-09-05 ENCOUNTER — Ambulatory Visit (INDEPENDENT_AMBULATORY_CARE_PROVIDER_SITE_OTHER): Payer: Medicare Other | Admitting: Family

## 2012-09-05 VITALS — BP 116/64 | HR 67 | Temp 97.5°F | Resp 16 | Ht 66.0 in | Wt 170.0 lb

## 2012-09-05 DIAGNOSIS — F0390 Unspecified dementia without behavioral disturbance: Secondary | ICD-10-CM

## 2012-09-05 DIAGNOSIS — E119 Type 2 diabetes mellitus without complications: Secondary | ICD-10-CM

## 2012-09-05 MED ORDER — DONEPEZIL HCL 10 MG PO TABS: 10.0000 mg | ORAL_TABLET | Freq: Every evening | ORAL | Status: DC | PRN

## 2012-09-05 NOTE — Assessment & Plan Note (Signed)
Unable to afford namenda- d/c namenda start aricept.

## 2012-09-05 NOTE — Progress Notes (Signed)
Subjective:    Patient ID: Destiny [name], female    DOB: 1931-07-14, 79 y.o.   MRN: [id number]  HPI  Destiny [name] is a 79 yr old female who presents today for follow up of multiple medical problems.  Diabetes-Pt here for follow up. Notes elevated blood sugar readings and has increased humalog to 5-15units before meals. Reports that she has not been compliant with diet.  Reports that her sugars are high.  "picks up food" instead of cooking.  Eating pop tarts for breakfast. For lunch she has a sandwich.  Dinner her daughter sometimes cooks.    Memory Loss-Pt reports increased forgetfulness. Requests change from namenda to aricept due to cost. Cannot afford namenda.    Review of Systems See HPI  Past Medical History  Diagnosis Date  . PVD (peripheral vascular disease)   . CAD (coronary artery disease)     a. s/p CABG x 4 (LIMA->LAD, VG->Diag, VG->OM1->OM3);  b. 2010 Cath: 3/4 patent grafts (VG->diag occluded), 3vd, sev PVD ->Med Rx;  c. 12/2011 Lexi MV: sm area of mild mid and apical ant wall ischemia ->med rx.  . Hypertension   . Hyperlipidemia   . Duodenal ulcer 2009 and 11/2011  . Iron deficiency anemia secondary to blood loss (chronic)   . Amaurosis fugax   . Carotid bruit   . Diabetes mellitus type II   . Dementia     pt denies  . Diabetic retinopathy   . Gallstones 2009    seen on CT scan  . Diverticulosis   . Atherosclerosis   . Renal insufficiency   . CVA (cerebral infarction)   . Diabetic peripheral neuropathy   . GI bleed May 14, 2011, 11/2011    Cause not defined.   . Colon polyp     History   Social History  . Marital Status: Widowed    Spouse Name: N/A    Number of Children: 4  . Years of Education: N/A   Occupational History  . retired    Social History Main Topics  . Smoking status: Former Smoker    Types: Cigarettes    Quit date: 12/31/2000  . Smokeless tobacco: Never Used  . Alcohol Use: No  . Drug Use: No  . Sexually Active: Not on file   Other  Topics Concern  . Not on file   Social History Narrative   Widowed - husband passed away in 2009/05/13   lives with daughter with epilepsy - mental retardation   Retired     Former Smoker      Alcohol use-no          Occupation: Retired       son - Addileigh Huffstutler     Past Surgical History  Procedure Laterality Date  . Coronary artery bypass graft  1999    x 4  . Abdominal hysterectomy    . Upper gastrointestinal endoscopy  02/22/2008    w/biopsy, duedenal ulcer, antrum erosions  . Esophagogastroduodenoscopy  05/16/2011    Procedure: ESOPHAGOGASTRODUODENOSCOPY (EGD);  Surgeon: Gatha Mayer, MD;  Location: [facility] ENDOSCOPY;  Service: Endoscopy;  Laterality: N/A;  . Colonoscopy  05/20/2011    Procedure: COLONOSCOPY;  Surgeon: Scarlette Shorts, MD;  Location: [place];  Service: Endoscopy;  Laterality: N/A;  . Esophagogastroduodenoscopy  12/08/2011    Procedure: ESOPHAGOGASTRODUODENOSCOPY (EGD);  Surgeon: Ladene Artist, MD,FACG;  Location: [facility] ENDOSCOPY;  Service: Endoscopy;  Laterality: N/A;    Family History  Problem Relation Age of Onset  .  Coronary artery disease Father   . Hypertension Father   . Stroke Father   . Cancer Mother     ?  . Diabetes Maternal Uncle   . Colon cancer Neg Hx     No Known Allergies  Current Outpatient Prescriptions on File Prior to Visit  Medication Sig Dispense Refill  . amLODipine (NORVASC) 10 MG tablet Take 10 mg by mouth daily.      Marland Kitchen aspirin 81 MG tablet Take 1 tablet (81 mg total) by mouth daily.  30 tablet    . Cholecalciferol (VITAMIN D) 1000 UNITS capsule Take 1,000 Units by mouth daily.       . clopidogrel (PLAVIX) 75 MG tablet TAKE 1 TABLET BY MOUTH DAILY.  90 tablet  1  . colesevelam (WELCHOL) 625 MG tablet Take 3 tablets (1,875 mg total) by mouth daily.  270 tablet  1  . CRESTOR 20 MG tablet TAKE 1 TABLET BY MOUTH AT BEDTIME.  90 tablet  0  . Ferrous Sulfate (IRON) 325 (65 FE) MG TABS Take 325 mg by mouth 2 (two) times daily.       .  furosemide (LASIX) 20 MG tablet Take 20 mg by mouth daily.      . Glucosamine-Chondroit-Vit C-Mn (GLUCOSAMINE CHONDR 500 COMPLEX) CAPS Take 1 capsule by mouth daily.       . Insulin Glargine (LANTUS SOLOSTAR) 100 UNIT/ML SOPN Inject 30 Units into the skin daily.  15 mL  3  . insulin lispro (HUMALOG KWIKPEN) 100 unit/mL SOLN INJECT 5-10 UNITS INTO THE SKIN 3 (THREE) TIMES DAILY BEFORE MEALS. Sliding scale.  45 mL  1  . isosorbide mononitrate (IMDUR) 60 MG 24 hr tablet Take 1 tablet (60 mg total) by mouth daily.  90 tablet  1  . memantine (NAMENDA) 10 MG tablet Take 1 tablet (10 mg total) by mouth 2 (two) times daily.  180 tablet  1  . nitroGLYCERIN (NITROSTAT) 0.4 MG SL tablet Place 0.4 mg under the tongue every 5 (five) minutes as needed for chest pain. Chest pain      . Omega-3 Fatty Acids (FISH OIL) 1200 MG CAPS Take 1 capsule by mouth daily.      . pantoprazole (PROTONIX) 40 MG tablet Take 1 tablet (40 mg total) by mouth daily.  90 tablet  1  . quinapril (ACCUPRIL) 40 MG tablet Take 0.5 tablets (20 mg total) by mouth daily.  45 tablet  1  . vitamin B-12 (CYANOCOBALAMIN) 1000 MCG tablet Take 1,000 mcg by mouth daily.       No current facility-administered medications on file prior to visit.    BP 116/64  Pulse 67  Temp(Src) 97.5 F (36.4 C) (Oral)  Resp 16  Ht 5\' 6"  (1.676 m)  Wt 170 lb 0.6 oz (77.13 kg)  BMI 27.46 kg/m2  SpO2 97%       Objective:   Physical Exam  Constitutional: She is oriented to person, place, and time. She appears well-developed and well-nourished. No distress.  Cardiovascular: Normal rate and regular rhythm.   No murmur heard. Pulmonary/Chest: Effort normal and breath sounds normal. No respiratory distress. She has no wheezes. She has no rales. She exhibits no tenderness.  Musculoskeletal: She exhibits edema.  Neurological: She is alert and oriented to person, place, and time.  Psychiatric: She has a normal mood and affect. Her behavior is normal. Judgment  and thought content normal.          Assessment & Plan:

## 2012-09-05 NOTE — Patient Instructions (Signed)
Follow up in 2 months

## 2012-09-05 NOTE — Assessment & Plan Note (Signed)
Uncontrolled. Will increase lantus from 30 units to 35 units.

## 2012-09-22 ENCOUNTER — Other Ambulatory Visit: Payer: Self-pay

## 2012-10-01 ENCOUNTER — Other Ambulatory Visit: Payer: Self-pay | Admitting: Internal Medicine

## 2012-10-25 ENCOUNTER — Ambulatory Visit (INDEPENDENT_AMBULATORY_CARE_PROVIDER_SITE_OTHER): Payer: Medicare Other | Admitting: Physician Assistant

## 2012-10-25 ENCOUNTER — Encounter: Payer: Self-pay | Admitting: Physician Assistant

## 2012-10-25 VITALS — BP 126/68 | HR 69 | Temp 97.8°F | Resp 16 | Wt 163.5 lb

## 2012-10-25 DIAGNOSIS — R8290 Unspecified abnormal findings in urine: Secondary | ICD-10-CM

## 2012-10-25 DIAGNOSIS — R3915 Urgency of urination: Secondary | ICD-10-CM

## 2012-10-25 DIAGNOSIS — N390 Urinary tract infection, site not specified: Secondary | ICD-10-CM | POA: Insufficient documentation

## 2012-10-25 DIAGNOSIS — R5383 Other fatigue: Secondary | ICD-10-CM

## 2012-10-25 DIAGNOSIS — R350 Frequency of micturition: Secondary | ICD-10-CM

## 2012-10-25 DIAGNOSIS — R82998 Other abnormal findings in urine: Secondary | ICD-10-CM

## 2012-10-25 LAB — CBC WITH DIFFERENTIAL/PLATELET
Basophils Absolute: 0 10*3/uL (ref 0.0–0.1)
Basophils Relative: 0 % (ref 0–1)
MCHC: 33.8 g/dL (ref 30.0–36.0)
Neutro Abs: 5.7 10*3/uL (ref 1.7–7.7)
Neutrophils Relative %: 62 % (ref 43–77)
RDW: 14 % (ref 11.5–15.5)

## 2012-10-25 LAB — POCT URINALYSIS DIPSTICK
Glucose, UA: NEGATIVE
Nitrite, UA: NEGATIVE

## 2012-10-25 MED ORDER — CIPROFLOXACIN HCL 500 MG PO TABS: ORAL_TABLET | ORAL | Status: DC

## 2012-10-25 NOTE — Assessment & Plan Note (Signed)
Rx for ciprofloxacin.  Urine sent for culture.  Will obtain CBC, BMP today due to patient's fatigue

## 2012-10-25 NOTE — Patient Instructions (Signed)
Please take antibiotic as prescribed.  Drink plenty of fluids.  I will call you with lab results.  Call or return if fever develops or if you experience confusion or back pain.  Urinary Tract Infection Urinary tract infections (UTIs) can develop anywhere along your urinary tract. Your urinary tract is your body's drainage system for removing wastes and extra water. Your urinary tract includes two kidneys, two ureters, a bladder, and a urethra. Your kidneys are a pair of bean-shaped organs. Each kidney is about the size of your fist. They are located below your ribs, one on each side of your spine. CAUSES Infections are caused by microbes, which are microscopic organisms, including fungi, viruses, and bacteria. These organisms are so small that they can only be seen through a microscope. Bacteria are the microbes that most commonly cause UTIs. SYMPTOMS  Symptoms of UTIs may vary by age and gender of the patient and by the location of the infection. Symptoms in young women typically include a frequent and intense urge to urinate and a painful, burning feeling in the bladder or urethra during urination. Older women and men are more likely to be tired, shaky, and weak and have muscle aches and abdominal pain. A fever may mean the infection is in your kidneys. Other symptoms of a kidney infection include pain in your back or sides below the ribs, nausea, and vomiting. DIAGNOSIS To diagnose a UTI, your caregiver will ask you about your symptoms. Your caregiver also will ask to provide a urine sample. The urine sample will be tested for bacteria and white blood cells. White blood cells are made by your body to help fight infection. TREATMENT  Typically, UTIs can be treated with medication. Because most UTIs are caused by a bacterial infection, they usually can be treated with the use of antibiotics. The choice of antibiotic and length of treatment depend on your symptoms and the type of bacteria causing your  infection. HOME CARE INSTRUCTIONS  If you were prescribed antibiotics, take them exactly as your caregiver instructs you. Finish the medication even if you feel better after you have only taken some of the medication.  Drink enough water and fluids to keep your urine clear or pale yellow.  Avoid caffeine, tea, and carbonated beverages. They tend to irritate your bladder.  Empty your bladder often. Avoid holding urine for long periods of time.  Empty your bladder before and after sexual intercourse.  After a bowel movement, women should cleanse from front to back. Use each tissue only once. SEEK MEDICAL CARE IF:   You have back pain.  You develop a fever.  Your symptoms do not begin to resolve within 3 days. SEEK IMMEDIATE MEDICAL CARE IF:   You have severe back pain or lower abdominal pain.  You develop chills.  You have nausea or vomiting.  You have continued burning or discomfort with urination. MAKE SURE YOU:   Understand these instructions.  Will watch your condition.  Will get help right away if you are not doing well or get worse. Document Released: 12/10/2004 Document Revised: 09/01/2011 Document Reviewed: 04/10/2011 [facility] Patient Information 2014 [place].

## 2012-10-25 NOTE — Progress Notes (Signed)
Patient ID: Destiny [name], female   DOB: [birth year], 79 y.o.   MRN: [id number]   Patient presents to clinic today with her son c/o fatigue, urinary urgency, frequency for the past 2 weeks.  Patient states she felt like she had a "kidney infection" 2 weeks ago but never went to the doctor.  Endorses feeling a constant urge to urinate, burning sensation with urination.  Denies fever, chills, hematuria, or flank pain.  Son endorses his mother has seemed a bit more fatigued than usual over the past few weeks.  Past Medical History  Diagnosis Date  . PVD (peripheral vascular disease)   . CAD (coronary artery disease)     a. s/p CABG x 4 (LIMA->LAD, VG->Diag, VG->OM1->OM3);  b. 2010 Cath: 3/4 patent grafts (VG->diag occluded), 3vd, sev PVD ->Med Rx;  c. 12/2011 Lexi MV: sm area of mild mid and apical ant wall ischemia ->med rx.  . Hypertension   . Hyperlipidemia   . Duodenal ulcer 2009 and 11/2011  . Iron deficiency anemia secondary to blood loss (chronic)   . Amaurosis fugax   . Carotid bruit   . Diabetes mellitus type II   . Dementia     pt denies  . Diabetic retinopathy   . Gallstones 2009    seen on CT scan  . Diverticulosis   . Atherosclerosis   . Renal insufficiency   . CVA (cerebral infarction)   . Diabetic peripheral neuropathy   . GI bleed 04/2011, 11/2011    Cause not defined.   . Colon polyp    Current Outpatient Prescriptions on File Prior to Visit  Medication Sig Dispense Refill  . amLODipine (NORVASC) 10 MG tablet Take 10 mg by mouth daily.      Marland Kitchen aspirin 81 MG tablet Take 1 tablet (81 mg total) by mouth daily.  30 tablet    . Cholecalciferol (VITAMIN D) 1000 UNITS capsule Take 1,000 Units by mouth daily.       . clopidogrel (PLAVIX) 75 MG tablet TAKE 1 TABLET BY MOUTH DAILY.  90 tablet  1  . colesevelam (WELCHOL) 625 MG tablet Take 3 tablets (1,875 mg total) by mouth daily.  270 tablet  1  . CRESTOR 20 MG tablet TAKE 1 TABLET BY MOUTH AT BEDTIME.  90 tablet  0  .  donepezil (ARICEPT) 10 MG tablet Take 1 tablet (10 mg total) by mouth at bedtime as needed.  30 tablet  5  . Ferrous Sulfate (IRON) 325 (65 FE) MG TABS Take 325 mg by mouth 2 (two) times daily.       . furosemide (LASIX) 20 MG tablet Take 20 mg by mouth daily.      . Glucosamine-Chondroit-Vit C-Mn (GLUCOSAMINE CHONDR 500 COMPLEX) CAPS Take 1 capsule by mouth daily.       . Insulin Glargine 100 UNIT/ML SOPN Inject 35 Units into the skin daily.      . insulin lispro (HUMALOG KWIKPEN) 100 unit/mL SOLN INJECT 5-10 UNITS INTO THE SKIN 3 (THREE) TIMES DAILY BEFORE MEALS. Sliding scale.  45 mL  1  . isosorbide mononitrate (IMDUR) 60 MG 24 hr tablet Take 1 tablet (60 mg total) by mouth daily.  90 tablet  1  . NITROSTAT 0.4 MG SL tablet PLACE 1 TABLET (0.4 MG TOTAL) UNDER THE TONGUE EVERY 5 (FIVE) MINUTES AS NEEDED.  25 tablet  0  . Omega-3 Fatty Acids (FISH OIL) 1200 MG CAPS Take 1 capsule by mouth daily.      Marland Kitchen  pantoprazole (PROTONIX) 40 MG tablet Take 1 tablet (40 mg total) by mouth daily.  90 tablet  1  . quinapril (ACCUPRIL) 40 MG tablet Take 0.5 tablets (20 mg total) by mouth daily.  45 tablet  1  . vitamin B-12 (CYANOCOBALAMIN) 1000 MCG tablet Take 1,000 mcg by mouth daily.       No current facility-administered medications on file prior to visit.   No Known Allergies Family History  Problem Relation Age of Onset  . Coronary artery disease Father   . Hypertension Father   . Stroke Father   . Cancer Mother     ?  . Diabetes Maternal Uncle   . Colon cancer Neg Hx    History   Social History  . Marital Status: Widowed    Spouse Name: N/A    Number of Children: 4  . Years of Education: N/A   Occupational History  . retired    Social History Main Topics  . Smoking status: Former Smoker    Types: Cigarettes    Quit date: 12/31/2000  . Smokeless tobacco: Never Used  . Alcohol Use: No  . Drug Use: No  . Sexually Active: None   Other Topics Concern  . None   Social History  Narrative   Widowed - husband passed away in 05/23/2009   lives with daughter with epilepsy - mental retardation   Retired     Former Smoker      Alcohol use-no          Occupation: Retired       son - Iridian Heinzman    Review of Systems  Constitutional: Positive for malaise/fatigue. Negative for fever, chills and weight loss.  Gastrointestinal: Negative for nausea, vomiting and abdominal pain.  Genitourinary: Positive for dysuria, urgency and frequency. Negative for hematuria and flank pain.  Musculoskeletal: Negative for myalgias and back pain.   Filed Vitals:   10/25/12 1149  BP: 126/68  Pulse: 69  Temp: 97.8 F (36.6 C)  Resp: 16   Physical Exam  Vitals reviewed. Constitutional: She is oriented to person, place, and time and well-developed, well-nourished, and in no distress.  HENT:  Head: Normocephalic and atraumatic.  Right Ear: External ear normal.  Left Ear: External ear normal.  Nose: Nose normal.  Mouth/Throat: Oropharynx is clear and moist. No oropharyngeal exudate.  TM WNL bilaterally  Eyes: Conjunctivae are normal. Pupils are equal, round, and reactive to light.  Neck: Normal range of motion. Neck supple.  Cardiovascular: Normal rate, regular rhythm, normal heart sounds and intact distal pulses.   Pulmonary/Chest: Effort normal and breath sounds normal. No respiratory distress. She has no wheezes. She has no rales. She exhibits no tenderness.  Abdominal: Soft. Bowel sounds are normal. She exhibits no distension and no mass. There is no tenderness. There is no rebound and no guarding.  Lymphadenopathy:    She has no cervical adenopathy.  Neurological: She is alert and oriented to person, place, and time. No cranial nerve deficit.  Skin: Skin is warm and dry. No rash noted.   Diagnostics: Urinalysis with + Leukocytes.  Will send for culture.  Assessment/Plan: No problem-specific assessment & plan notes found for this encounter.

## 2012-10-26 LAB — BASIC METABOLIC PANEL
Potassium: 4.3 mEq/L (ref 3.5–5.3)
Sodium: 139 mEq/L (ref 135–145)

## 2012-10-26 NOTE — Telephone Encounter (Signed)
Received another fax from [facility].   Faxed back stating pt doesn't want this and to stop faxing

## 2012-10-27 ENCOUNTER — Ambulatory Visit (INDEPENDENT_AMBULATORY_CARE_PROVIDER_SITE_OTHER): Payer: Medicare Other | Admitting: Physician Assistant

## 2012-10-27 ENCOUNTER — Encounter: Payer: Self-pay | Admitting: Physician Assistant

## 2012-10-27 VITALS — BP 166/74 | HR 54 | Temp 97.5°F | Resp 16 | Ht 65.0 in | Wt 163.0 lb

## 2012-10-27 DIAGNOSIS — E785 Hyperlipidemia, unspecified: Secondary | ICD-10-CM

## 2012-10-27 DIAGNOSIS — N390 Urinary tract infection, site not specified: Secondary | ICD-10-CM

## 2012-10-27 DIAGNOSIS — N259 Disorder resulting from impaired renal tubular function, unspecified: Secondary | ICD-10-CM

## 2012-10-27 DIAGNOSIS — I10 Essential (primary) hypertension: Secondary | ICD-10-CM

## 2012-10-27 DIAGNOSIS — Z794 Long term (current) use of insulin: Secondary | ICD-10-CM

## 2012-10-27 DIAGNOSIS — E119 Type 2 diabetes mellitus without complications: Secondary | ICD-10-CM

## 2012-10-27 DIAGNOSIS — Z8673 Personal history of transient ischemic attack (TIA), and cerebral infarction without residual deficits: Secondary | ICD-10-CM

## 2012-10-27 DIAGNOSIS — Z0000 Encounter for general adult medical examination without abnormal findings: Secondary | ICD-10-CM

## 2012-10-27 DIAGNOSIS — E538 Deficiency of other specified B group vitamins: Secondary | ICD-10-CM

## 2012-10-27 LAB — LIPID PANEL
Cholesterol: 160 mg/dL (ref 0–200)
HDL: 45 mg/dL (ref >39)
Total CHOL/HDL Ratio: 3.6 Ratio

## 2012-10-27 LAB — HEPATIC FUNCTION PANEL
AST: 21 U/L (ref 0–37)
Albumin: 4.2 g/dL (ref 3.5–5.2)
Alkaline Phosphatase: 74 U/L (ref 39–117)
Total Bilirubin: 1 mg/dL (ref 0.3–1.2)
Total Protein: 6.8 g/dL (ref 6.0–8.3)

## 2012-10-27 LAB — URINE CULTURE: Colony Count: >=100000

## 2012-10-27 MED ORDER — CLOPIDOGREL BISULFATE 75 MG PO TABS: ORAL_TABLET | ORAL | Status: DC

## 2012-10-27 MED ORDER — AMLODIPINE BESYLATE 10 MG PO TABS: 10.0000 mg | ORAL_TABLET | Freq: Every day | ORAL | Status: DC

## 2012-10-27 MED ORDER — ROSUVASTATIN CALCIUM 20 MG PO TABS: ORAL_TABLET | ORAL | Status: DC

## 2012-10-27 MED ORDER — FUROSEMIDE 20 MG PO TABS: 20.0000 mg | ORAL_TABLET | Freq: Every day | ORAL | Status: DC

## 2012-10-27 NOTE — Assessment & Plan Note (Addendum)
Continue insulin regimen. Patient encouraged to keep BS log.  Will check A1C today.  Diabetic foot exam performed with no abnormal findings.

## 2012-10-27 NOTE — Assessment & Plan Note (Signed)
Refill Crestor.  Will check fasting lipids.

## 2012-10-27 NOTE — Assessment & Plan Note (Signed)
Patient asymptomatic.  Will check serum B12 level.

## 2012-10-27 NOTE — Progress Notes (Signed)
Patient ID: Destiny [name], female   DOB: Sep 25, 1931, 79 y.o.   MRN: [id number]   Patient presents today for annual exam.    Patient was seen this past week and treated for a UTI.  Since initiation of treatment patient states urinary urgency, frequency and dysuria have resolved.  She has been taking medication as prescribed -- has 2 pills of Ciprofloxacin left.  Has not increased fluid intake as previously recommended.  Denies fever, chills or flank pain.  Patient denies any other current complaints.  Health Maintenance: (1) Annual Eye Exam -- Last visit in January 2014 (2) Dental Visits -- Patient is edentulous; wears dentures; does not see dentist. (3) Colonoscopy -- Last colonoscopy in 2013 (4) Diabetic Foot Exam -- Overdue  Chronic Issues: (1) Diabetes Mellitus Type II --  Patient states that she takes insulin as prescribed.  Checks BS 3-4 times/day.  Does not keep a blood sugar journal.  Denies numbness, tingling, or burning in feet, legs or hands.  Denies lightheadedness, dizziness, syncopal episode or confusion.  (2) Hypertension --  BP 166/74 in office today.  Patient states that she takes bp medications as prescribed but has been out of medicine for a few days.  Needs medication refill of some of her BP medications.  Is not sure which she is out of.  Spoke with patient's son about checking mother's prescription supply.  (3) Hyperlipidemia -- Takes medication as prescribed.  (4) B12 Deficiency --  Takes medication as prescribed.  Denies neurological deficit, change in mentation, numbness or tingling.  (5) HX CVA -- patient takes Plavix and daily aspirin 81mg .  Past Medical History  Diagnosis Date  . PVD (peripheral vascular disease)   . CAD (coronary artery disease)     a. s/p CABG x 4 (LIMA->LAD, VG->Diag, VG->OM1->OM3);  b. 2010 Cath: 3/4 patent grafts (VG->diag occluded), 3vd, sev PVD ->Med Rx;  c. 12/2011 Lexi MV: sm area of mild mid and apical ant wall ischemia ->med rx.  .  Hypertension   . Hyperlipidemia   . Duodenal ulcer 2009 and 11/2011  . Iron deficiency anemia secondary to blood loss (chronic)   . Amaurosis fugax   . Carotid bruit   . Diabetes mellitus type II   . Dementia     pt denies  . Diabetic retinopathy   . Gallstones 2009    seen on CT scan  . Diverticulosis   . Atherosclerosis   . Renal insufficiency   . CVA (cerebral infarction)   . Diabetic peripheral neuropathy   . GI bleed 04/2011, 11/2011    Cause not defined.   . Colon polyp    Current Outpatient Prescriptions on File Prior to Visit  Medication Sig Dispense Refill  . aspirin 81 MG tablet Take 1 tablet (81 mg total) by mouth daily.  30 tablet    . Cholecalciferol (VITAMIN D) 1000 UNITS capsule Take 1,000 Units by mouth daily.       . ciprofloxacin (CIPRO) 500 MG tablet Take 1 tablet each day for 5 days  5 tablet  0  . colesevelam (WELCHOL) 625 MG tablet Take 3 tablets (1,875 mg total) by mouth daily.  270 tablet  1  . donepezil (ARICEPT) 10 MG tablet Take 1 tablet (10 mg total) by mouth at bedtime as needed.  30 tablet  5  . Ferrous Sulfate (IRON) 325 (65 FE) MG TABS Take 325 mg by mouth 2 (two) times daily.       . Glucosamine-Chondroit-Vit C-Mn (  GLUCOSAMINE CHONDR 500 COMPLEX) CAPS Take 1 capsule by mouth daily.       . Insulin Glargine 100 UNIT/ML SOPN Inject 35 Units into the skin daily.      . insulin lispro (HUMALOG KWIKPEN) 100 unit/mL SOLN INJECT 5-10 UNITS INTO THE SKIN 3 (THREE) TIMES DAILY BEFORE MEALS. Sliding scale.  45 mL  1  . isosorbide mononitrate (IMDUR) 60 MG 24 hr tablet Take 1 tablet (60 mg total) by mouth daily.  90 tablet  1  . NITROSTAT 0.4 MG SL tablet PLACE 1 TABLET (0.4 MG TOTAL) UNDER THE TONGUE EVERY 5 (FIVE) MINUTES AS NEEDED.  25 tablet  0  . Omega-3 Fatty Acids (FISH OIL) 1200 MG CAPS Take 1 capsule by mouth daily.      . pantoprazole (PROTONIX) 40 MG tablet Take 1 tablet (40 mg total) by mouth daily.  90 tablet  1  . quinapril (ACCUPRIL) 40 MG tablet  Take 0.5 tablets (20 mg total) by mouth daily.  45 tablet  1  . vitamin B-12 (CYANOCOBALAMIN) 1000 MCG tablet Take 1,000 mcg by mouth daily.       No current facility-administered medications on file prior to visit.   No Known Allergies Family History  Problem Relation Age of Onset  . Coronary artery disease Father   . Hypertension Father   . Stroke Father   . Cancer Mother     ?  . Diabetes Maternal Uncle   . Colon cancer Neg Hx    History   Social History  . Marital Status: Widowed    Spouse Name: N/A    Number of Children: 4  . Years of Education: N/A   Occupational History  . retired    Social History Main Topics  . Smoking status: Former Smoker    Types: Cigarettes    Quit date: 12/31/2000  . Smokeless tobacco: Never Used  . Alcohol Use: No  . Drug Use: No  . Sexual Activity: None   Other Topics Concern  . None   Social History Narrative   Widowed - husband passed away in 05-Jun-2009   lives with daughter with epilepsy - mental retardation   Retired     Former Smoker      Alcohol use-no          Occupation: Retired       son - Aitana Linsenbigler    Review of Systems  Constitutional: Negative for fever, chills, weight loss and malaise/fatigue.  HENT: Negative for hearing loss, ear pain and tinnitus.   Eyes: Negative for blurred vision, double vision and photophobia.  Respiratory: Negative for cough, hemoptysis, sputum production, shortness of breath and wheezing.   Cardiovascular: Negative for chest pain and palpitations.  Gastrointestinal: Negative for heartburn, nausea, vomiting, abdominal pain, diarrhea and constipation.  Genitourinary: Negative for dysuria, urgency, frequency, hematuria and flank pain.  Musculoskeletal: Negative for myalgias and back pain.  Neurological: Negative for dizziness, tingling, loss of consciousness and headaches.  Psychiatric/Behavioral: Negative for memory loss.   Filed Vitals:   10/27/12 0901  BP: 166/74  Pulse: 54  Temp:  97.5 F (36.4 C)  Resp: 16   Physical Exam  Vitals reviewed. Constitutional: She is oriented to person, place, and time and well-developed, well-nourished, and in no distress.  HENT:  Head: Normocephalic and atraumatic.  Right Ear: External ear normal.  Left Ear: External ear normal.  Nose: Nose normal.  Mouth/Throat: Oropharynx is clear and moist. No oropharyngeal exudate.  TMs WNL  bilaterally  Eyes: Conjunctivae and EOM are normal. Pupils are equal, round, and reactive to light.  Neck: Normal range of motion. Neck supple. No thyromegaly present.  Cardiovascular: Normal rate, regular rhythm, normal heart sounds and intact distal pulses.   Pulmonary/Chest: Effort normal and breath sounds normal. No respiratory distress. She has no wheezes. She has no rales. She exhibits no tenderness.  Abdominal: Soft. Bowel sounds are normal. She exhibits no distension and no mass. There is no tenderness. There is no rebound and no guarding.  Musculoskeletal: Normal range of motion. She exhibits no tenderness.  Lymphadenopathy:    She has no cervical adenopathy.  Neurological: She is alert and oriented to person, place, and time. She has normal reflexes. No cranial nerve deficit.  Skin: Skin is warm and dry. No rash noted.   Labs: Results for orders placed in visit on 10/25/12 (from the past 72 hour(s))  POCT URINALYSIS DIPSTICK     Status: None   Collection Time    10/25/12 12:06 PM      Result Value Range   Color, UA Dark Radiation protection practitioner, UA Murky     Glucose, UA neg     Bilirubin, UA small     Ketones, UA trace     Spec Grav, UA >=1.030     Blood, UA hemoly trace     pH, UA 6.0     Protein, UA 300+++     Urobilinogen, UA 0.2     Nitrite, UA neg     Leukocytes, UA moderate (2+)    URINE CULTURE     Status: None   Collection Time    10/25/12 12:06 PM      Result Value Range   Culture ENTEROBACTER AEROGENES     Colony Count >=100,000 COLONIES/ML     Organism ID, Bacteria  ENTEROBACTER AEROGENES    CBC WITH DIFFERENTIAL     Status: Abnormal   Collection Time    10/25/12 12:14 PM      Result Value Range   WBC 9.4  4.0 - 10.5 K/uL   RBC 4.03  3.87 - 5.11 MIL/uL   Hemoglobin 12.1  12.0 - 15.0 g/dL   HCT 35.8 (*) 36.0 - 46.0 %   MCV 88.8  78.0 - 100.0 fL   MCH 30.0  26.0 - 34.0 pg   MCHC 33.8  30.0 - 36.0 g/dL   RDW 14.0  11.5 - 15.5 %   Platelets 195  150 - 400 K/uL   Neutrophils Relative % 62  43 - 77 %   Neutro Abs 5.7  1.7 - 7.7 K/uL   Lymphocytes Relative 24  12 - 46 %   Lymphs Abs 2.3  0.7 - 4.0 K/uL   Monocytes Relative 13 (*) 3 - 12 %   Monocytes Absolute 1.3 (*) 0.1 - 1.0 K/uL   Eosinophils Relative 1  0 - 5 %   Eosinophils Absolute 0.1  0.0 - 0.7 K/uL   Basophils Relative 0  0 - 1 %   Basophils Absolute 0.0  0.0 - 0.1 K/uL   Smear Review Criteria for review not met    BASIC METABOLIC PANEL     Status: Abnormal   Collection Time    10/25/12 12:14 PM      Result Value Range   Sodium 139  135 - 145 mEq/L   Potassium 4.3  3.5 - 5.3 mEq/L   Chloride 105  96 - 112 mEq/L  CO2 24  19 - 32 mEq/L   Glucose, Bld 166 (*) 70 - 99 mg/dL   BUN 38 (*) 6 - 23 mg/dL   Creat 2.16 (*) 0.50 - 1.10 mg/dL   Calcium 9.6  8.4 - 10.5 mg/dL   Assessment/Plan: HYPERTENSION Refilled medications.  Patient to resume previous regimen.  Continue low salt intake.  B12 deficiency Patient asymptomatic.  Will check serum B12 level.  Diabetes mellitus type 2, insulin dependent Continue insulin regimen. Patient encouraged to keep BS log.  Will check A1C today.  Diabetic foot exam performed with no abnormal findings.  UTI (urinary tract infection) Resolving.  Patient to finish antibiotic therapy.    HYPERLIPIDEMIA Refill Crestor.  Will check fasting lipids.  Visit for preventive health examination Obtain fasting labs today.  Patient up to date on Colonoscopy, Annual Dilated eye exam, and Diabetic Foot Exam  RENAL INSUFFICIENCY Patient with elevated Cr and BUN.   BUN/CR ratio ~20.  Most likely secondary to dehydration from low fluid intake.  Encouraged increase in fluids.  Recheck BMP in 1 week.

## 2012-10-27 NOTE — Assessment & Plan Note (Addendum)
Obtain fasting labs today.  Patient up to date on Colonoscopy, Annual Dilated eye exam, and Diabetic Foot Exam

## 2012-10-27 NOTE — Assessment & Plan Note (Signed)
Resolving.  Patient to finish antibiotic therapy.

## 2012-10-27 NOTE — Patient Instructions (Signed)
Please finish Antibiotics.  Continue medications as prescribed.  Please call if you need refills of additional medicine.  Please drink plenty of fluid, at least 6-8 glasses per day.  Return in 1-2 weeks to recheck kidney function.  Call if you need anything.   Dehydration, Adult Dehydration is when you lose more fluids from the body than you take in. Vital organs like the kidneys, brain, and heart cannot function without a proper amount of fluids and salt. Any loss of fluids from the body can cause dehydration.  CAUSES   Vomiting.  Diarrhea.  Excessive sweating.  Excessive urine output.  Fever. SYMPTOMS  Mild dehydration  Thirst.  Dry lips.  Slightly dry mouth. Moderate dehydration  Very dry mouth.  Sunken eyes.  Skin does not bounce back quickly when lightly pinched and released.  Dark urine and decreased urine production.  Decreased tear production.  Headache. Severe dehydration  Very dry mouth.  Extreme thirst.  Rapid, weak pulse (more than 100 beats per minute at rest).  Cold hands and feet.  Not able to sweat in spite of heat and temperature.  Rapid breathing.  Blue lips.  Confusion and lethargy.  Difficulty being awakened.  Minimal urine production.  No tears. DIAGNOSIS  Your caregiver will diagnose dehydration based on your symptoms and your exam. Blood and urine tests will help confirm the diagnosis. The diagnostic evaluation should also identify the cause of dehydration. TREATMENT  Treatment of mild or moderate dehydration can often be done at home by increasing the amount of fluids that you drink. It is best to drink small amounts of fluid more often. Drinking too much at one time can make vomiting worse. Refer to the home care instructions below. Severe dehydration needs to be treated at the hospital where you will probably be given intravenous (IV) fluids that contain water and electrolytes. HOME CARE INSTRUCTIONS   Ask your caregiver  about specific rehydration instructions.  Drink enough fluids to keep your urine clear or pale yellow.  Drink small amounts frequently if you have nausea and vomiting.  Eat as you normally do.  Avoid:  Foods or drinks high in sugar.  Carbonated drinks.  Juice.  Extremely hot or cold fluids.  Drinks with caffeine.  Fatty, greasy foods.  Alcohol.  Tobacco.  Overeating.  Gelatin desserts.  Wash your hands well to avoid spreading bacteria and viruses.  Only take over-the-counter or prescription medicines for pain, discomfort, or fever as directed by your caregiver.  Ask your caregiver if you should continue all prescribed and over-the-counter medicines.  Keep all follow-up appointments with your caregiver. SEEK MEDICAL CARE IF:  You have abdominal pain and it increases or stays in one area (localizes).  You have a rash, stiff neck, or severe headache.  You are irritable, sleepy, or difficult to awaken.  You are weak, dizzy, or extremely thirsty. SEEK IMMEDIATE MEDICAL CARE IF:   You are unable to keep fluids down or you get worse despite treatment.  You have frequent episodes of vomiting or diarrhea.  You have blood or green matter (bile) in your vomit.  You have blood in your stool or your stool looks black and tarry.  You have not urinated in 6 to 8 hours, or you have only urinated a small amount of very dark urine.  You have a fever.  You faint. MAKE SURE YOU:   Understand these instructions.  Will watch your condition.  Will get help right away if you are not doing  well or get worse. Document Released: 03/02/2005 Document Revised: 05/25/2011 Document Reviewed: 10/20/2010 [facility] Patient Information 2014 [place], [place].

## 2012-10-27 NOTE — Assessment & Plan Note (Signed)
Refilled medications.  Patient to resume previous regimen.  Continue low salt intake.

## 2012-10-27 NOTE — Assessment & Plan Note (Signed)
Patient with elevated Cr and BUN.  BUN/CR ratio ~20.  Most likely secondary to dehydration from low fluid intake.  Encouraged increase in fluids.  Recheck BMP in 1 week.

## 2012-10-28 LAB — TSH: TSH: 1.148 u[IU]/mL (ref 0.350–4.500)

## 2012-11-07 ENCOUNTER — Ambulatory Visit (INDEPENDENT_AMBULATORY_CARE_PROVIDER_SITE_OTHER): Payer: Medicare Other | Admitting: Physician Assistant

## 2012-11-07 ENCOUNTER — Encounter: Payer: Self-pay | Admitting: Physician Assistant

## 2012-11-07 ENCOUNTER — Ambulatory Visit: Payer: Medicare Other | Admitting: Family

## 2012-11-07 VITALS — BP 138/66 | HR 54 | Temp 97.5°F | Resp 16 | Wt 164.5 lb

## 2012-11-07 DIAGNOSIS — I498 Other specified cardiac arrhythmias: Secondary | ICD-10-CM

## 2012-11-07 DIAGNOSIS — R001 Bradycardia, unspecified: Secondary | ICD-10-CM

## 2012-11-07 DIAGNOSIS — I200 Unstable angina: Secondary | ICD-10-CM

## 2012-11-07 DIAGNOSIS — M25519 Pain in unspecified shoulder: Secondary | ICD-10-CM

## 2012-11-07 DIAGNOSIS — R799 Abnormal finding of blood chemistry, unspecified: Secondary | ICD-10-CM

## 2012-11-07 DIAGNOSIS — N259 Disorder resulting from impaired renal tubular function, unspecified: Secondary | ICD-10-CM

## 2012-11-07 DIAGNOSIS — M25512 Pain in left shoulder: Secondary | ICD-10-CM

## 2012-11-07 DIAGNOSIS — R079 Chest pain, unspecified: Secondary | ICD-10-CM

## 2012-11-07 DIAGNOSIS — R7989 Other specified abnormal findings of blood chemistry: Secondary | ICD-10-CM

## 2012-11-07 LAB — BASIC METABOLIC PANEL
BUN: 22 mg/dL (ref 6–23)
Chloride: 107 mEq/L (ref 96–112)
Creat: 1.24 mg/dL — ABNORMAL HIGH (ref 0.50–1.10)
Glucose, Bld: 98 mg/dL (ref 70–99)
Potassium: 4.4 mEq/L (ref 3.5–5.3)

## 2012-11-07 NOTE — Patient Instructions (Addendum)
Please obtain labwork after today's visit.  Continue to drink plenty of fluids throughout the day, especially if you  Have been doing physical activity or have been outside in the heat.  You need to follow-up with cardiologist within the next week or so.  Dehydration, Adult Dehydration is when you lose more fluids from the body than you take in. Vital organs like the kidneys, brain, and heart cannot function without a proper amount of fluids and salt. Any loss of fluids from the body can cause dehydration.  CAUSES   Vomiting.  Diarrhea.  Excessive sweating.  Excessive urine output.  Fever. SYMPTOMS  Mild dehydration  Thirst.  Dry lips.  Slightly dry mouth. Moderate dehydration  Very dry mouth.  Sunken eyes.  Skin does not bounce back quickly when lightly pinched and released.  Dark urine and decreased urine production.  Decreased tear production.  Headache. Severe dehydration  Very dry mouth.  Extreme thirst.  Rapid, weak pulse (more than 100 beats per minute at rest).  Cold hands and feet.  Not able to sweat in spite of heat and temperature.  Rapid breathing.  Blue lips.  Confusion and lethargy.  Difficulty being awakened.  Minimal urine production.  No tears. DIAGNOSIS  Your caregiver will diagnose dehydration based on your symptoms and your exam. Blood and urine tests will help confirm the diagnosis. The diagnostic evaluation should also identify the cause of dehydration. TREATMENT  Treatment of mild or moderate dehydration can often be done at home by increasing the amount of fluids that you drink. It is best to drink small amounts of fluid more often. Drinking too much at one time can make vomiting worse. Refer to the home care instructions below. Severe dehydration needs to be treated at the hospital where you will probably be given intravenous (IV) fluids that contain water and electrolytes. HOME CARE INSTRUCTIONS   Ask your caregiver about  specific rehydration instructions.  Drink enough fluids to keep your urine clear or pale yellow.  Drink small amounts frequently if you have nausea and vomiting.  Eat as you normally do.  Avoid:  Foods or drinks high in sugar.  Carbonated drinks.  Juice.  Extremely hot or cold fluids.  Drinks with caffeine.  Fatty, greasy foods.  Alcohol.  Tobacco.  Overeating.  Gelatin desserts.  Wash your hands well to avoid spreading bacteria and viruses.  Only take over-the-counter or prescription medicines for pain, discomfort, or fever as directed by your caregiver.  Ask your caregiver if you should continue all prescribed and over-the-counter medicines.  Keep all follow-up appointments with your caregiver. SEEK MEDICAL CARE IF:  You have abdominal pain and it increases or stays in one area (localizes).  You have a rash, stiff neck, or severe headache.  You are irritable, sleepy, or difficult to awaken.  You are weak, dizzy, or extremely thirsty. SEEK IMMEDIATE MEDICAL CARE IF:   You are unable to keep fluids down or you get worse despite treatment.  You have frequent episodes of vomiting or diarrhea.  You have blood or green matter (bile) in your vomit.  You have blood in your stool or your stool looks black and tarry.  You have not urinated in 6 to 8 hours, or you have only urinated a small amount of very dark urine.  You have a fever.  You faint. MAKE SURE YOU:   Understand these instructions.  Will watch your condition.  Will get help right away if you are not doing well  or get worse. Document Released: 03/02/2005 Document Revised: 05/25/2011 Document Reviewed: 10/20/2010 [facility] Patient Information 2014 [place], [place].

## 2012-11-07 NOTE — Progress Notes (Signed)
Patient ID: Destiny [name], female   DOB: 04-16-1931, 79 y.o.   MRN: [id number]   Patient presents to clinic today for 2 week follow-up.  Information was obtained from the patient.  Patient states that she has been doing well overall.  Has been trying to increase her fluid intake daily.  Denies lightheadedness, dizziness, palpitations.  Endorses occasional chest pain that radiates to her left arm.  No pain at time of visit.  Patient states the pain lasts a few minutes and is relieved with a nitroglycerin SL.  Patient has history of CAD and multiple CVAs.  Sees cardiology but has not been seen since May.  Denies syncope or falls.  Denies N/V.  Denies fever, chills, sweats.  Past Medical History  Diagnosis Date  . PVD (peripheral vascular disease)   . CAD (coronary artery disease)     a. s/p CABG x 4 (LIMA->LAD, VG->Diag, VG->OM1->OM3);  b. 2010 Cath: 3/4 patent grafts (VG->diag occluded), 3vd, sev PVD ->Med Rx;  c. 12/2011 Lexi MV: sm area of mild mid and apical ant wall ischemia ->med rx.  . Hypertension   . Hyperlipidemia   . Duodenal ulcer 2009 and 11/2011  . Iron deficiency anemia secondary to blood loss (chronic)   . Amaurosis fugax   . Carotid bruit   . Diabetes mellitus type II   . Dementia     pt denies  . Diabetic retinopathy   . Gallstones 2009    seen on CT scan  . Diverticulosis   . Atherosclerosis   . Renal insufficiency   . CVA (cerebral infarction)   . Diabetic peripheral neuropathy   . GI bleed 04/2011, 11/2011    Cause not defined.   . Colon polyp     Current Outpatient Prescriptions on File Prior to Visit  Medication Sig Dispense Refill  . amLODipine (NORVASC) 10 MG tablet Take 1 tablet (10 mg total) by mouth daily.  30 tablet  3  . aspirin 81 MG tablet Take 1 tablet (81 mg total) by mouth daily.  30 tablet    . Cholecalciferol (VITAMIN D) 1000 UNITS capsule Take 1,000 Units by mouth daily.       . clopidogrel (PLAVIX) 75 MG tablet TAKE 1 TABLET BY MOUTH DAILY.  90  tablet  1  . colesevelam (WELCHOL) 625 MG tablet Take 3 tablets (1,875 mg total) by mouth daily.  270 tablet  1  . donepezil (ARICEPT) 10 MG tablet Take 1 tablet (10 mg total) by mouth at bedtime as needed.  30 tablet  5  . Ferrous Sulfate (IRON) 325 (65 FE) MG TABS Take 325 mg by mouth 2 (two) times daily.       . furosemide (LASIX) 20 MG tablet Take 1 tablet (20 mg total) by mouth daily.  30 tablet  2  . Glucosamine-Chondroit-Vit C-Mn (GLUCOSAMINE CHONDR 500 COMPLEX) CAPS Take 1 capsule by mouth daily.       . Insulin Glargine 100 UNIT/ML SOPN Inject 35 Units into the skin daily.      . insulin lispro (HUMALOG KWIKPEN) 100 unit/mL SOLN INJECT 5-10 UNITS INTO THE SKIN 3 (THREE) TIMES DAILY BEFORE MEALS. Sliding scale.  45 mL  1  . isosorbide mononitrate (IMDUR) 60 MG 24 hr tablet Take 1 tablet (60 mg total) by mouth daily.  90 tablet  1  . NITROSTAT 0.4 MG SL tablet PLACE 1 TABLET (0.4 MG TOTAL) UNDER THE TONGUE EVERY 5 (FIVE) MINUTES AS NEEDED.  25 tablet  0  . Omega-3 Fatty Acids (FISH OIL) 1200 MG CAPS Take 1 capsule by mouth daily.      . pantoprazole (PROTONIX) 40 MG tablet Take 1 tablet (40 mg total) by mouth daily.  90 tablet  1  . quinapril (ACCUPRIL) 40 MG tablet Take 0.5 tablets (20 mg total) by mouth daily.  45 tablet  1  . rosuvastatin (CRESTOR) 20 MG tablet TAKE 1 TABLET BY MOUTH AT BEDTIME.  90 tablet  0  . vitamin B-12 (CYANOCOBALAMIN) 1000 MCG tablet Take 1,000 mcg by mouth daily.       No current facility-administered medications on file prior to visit.    No Known Allergies  Family History  Problem Relation Age of Onset  . Coronary artery disease Father   . Hypertension Father   . Stroke Father   . Cancer Mother     ?  . Diabetes Maternal Uncle   . Colon cancer Neg Hx     History   Social History  . Marital Status: Widowed    Spouse Name: N/A    Number of Children: 4  . Years of Education: N/A   Occupational History  . retired    Social History Main  Topics  . Smoking status: Former Smoker    Types: Cigarettes    Quit date: 12/31/2000  . Smokeless tobacco: Never Used  . Alcohol Use: No  . Drug Use: No  . Sexual Activity: None   Other Topics Concern  . None   Social History Narrative   Widowed - husband passed away in 06-04-2009   lives with daughter with epilepsy - mental retardation   Retired     Former Smoker      Alcohol use-no          Occupation: Retired       son - Ronicia Wuethrich    Review of Systems  Constitutional: Negative for fever, chills and malaise/fatigue.  Eyes: Negative for blurred vision and double vision.  Respiratory: Negative for cough, hemoptysis, sputum production, shortness of breath and wheezing.   Cardiovascular: Positive for chest pain. Negative for palpitations.  Gastrointestinal: Negative for nausea, vomiting and abdominal pain.  Neurological: Negative for dizziness, seizures, loss of consciousness and headaches.   Filed Vitals:   11/07/12 0935  BP: 138/66  Pulse: 54  Temp: 97.5 F (36.4 C)  Resp: 16    Physical Exam  Constitutional: She is oriented to person, place, and time and well-developed, well-nourished, and in no distress.  HENT:  Head: Normocephalic and atraumatic.  Eyes: Conjunctivae and EOM are normal. Pupils are equal, round, and reactive to light.  Neck: Normal range of motion. No thyromegaly present.  Cardiovascular: Normal rate, regular rhythm, normal heart sounds and intact distal pulses.   Pulmonary/Chest: Effort normal and breath sounds normal. No respiratory distress. She has no wheezes. She has no rales. She exhibits no tenderness.  Lymphadenopathy:    She has no cervical adenopathy.  Neurological: She is alert and oriented to person, place, and time. No cranial nerve deficit.  Skin: Skin is warm and dry. No rash noted.   Recent Results (from the past 2160 hour(s))  POCT URINALYSIS DIPSTICK     Status: None   Collection Time    10/25/12 12:06 PM      Result Value  Range   Color, UA Dark Golden     Clarity, UA Murky     Glucose, UA neg     Bilirubin, UA small  Ketones, UA trace     Spec Grav, UA >=1.030     Blood, UA hemoly trace     pH, UA 6.0     Protein, UA 300+++     Urobilinogen, UA 0.2     Nitrite, UA neg     Leukocytes, UA moderate (2+)    URINE CULTURE     Status: None   Collection Time    10/25/12 12:06 PM      Result Value Range   Culture ENTEROBACTER AEROGENES     Colony Count >=100,000 COLONIES/ML     Organism ID, Bacteria ENTEROBACTER AEROGENES    CBC WITH DIFFERENTIAL     Status: Abnormal   Collection Time    10/25/12 12:14 PM      Result Value Range   WBC 9.4  4.0 - 10.5 K/uL   RBC 4.03  3.87 - 5.11 MIL/uL   Hemoglobin 12.1  12.0 - 15.0 g/dL   HCT 35.8 (*) 36.0 - 46.0 %   MCV 88.8  78.0 - 100.0 fL   MCH 30.0  26.0 - 34.0 pg   MCHC 33.8  30.0 - 36.0 g/dL   RDW 14.0  11.5 - 15.5 %   Platelets 195  150 - 400 K/uL   Neutrophils Relative % 62  43 - 77 %   Neutro Abs 5.7  1.7 - 7.7 K/uL   Lymphocytes Relative 24  12 - 46 %   Lymphs Abs 2.3  0.7 - 4.0 K/uL   Monocytes Relative 13 (*) 3 - 12 %   Monocytes Absolute 1.3 (*) 0.1 - 1.0 K/uL   Eosinophils Relative 1  0 - 5 %   Eosinophils Absolute 0.1  0.0 - 0.7 K/uL   Basophils Relative 0  0 - 1 %   Basophils Absolute 0.0  0.0 - 0.1 K/uL   Smear Review Criteria for review not met    BASIC METABOLIC PANEL     Status: Abnormal   Collection Time    10/25/12 12:14 PM      Result Value Range   Sodium 139  135 - 145 mEq/L   Potassium 4.3  3.5 - 5.3 mEq/L   Chloride 105  96 - 112 mEq/L   CO2 24  19 - 32 mEq/L   Glucose, Bld 166 (*) 70 - 99 mg/dL   BUN 38 (*) 6 - 23 mg/dL   Creat 2.16 (*) 0.50 - 1.10 mg/dL   Calcium 9.6  8.4 - 10.5 mg/dL  TSH     Status: None   Collection Time    10/27/12  9:41 AM      Result Value Range   TSH 1.148  0.350 - 4.500 uIU/mL  LIPID PANEL     Status: Abnormal   Collection Time    10/27/12  9:41 AM      Result Value Range   Cholesterol  160  0 - 200 mg/dL   Comment: ATP III Classification:           < 200        mg/dL        Desirable          200 - 239     mg/dL        Borderline High          >= 240        mg/dL        High         Triglycerides  204 (*) <150 mg/dL   HDL 45  >39 mg/dL   Total CHOL/HDL Ratio 3.6     VLDL 41 (*) 0 - 40 mg/dL   LDL Cholesterol 74  0 - 99 mg/dL   Comment:       Total Cholesterol/HDL Ratio:CHD Risk                            Coronary Heart Disease Risk Table                                            Men       Women              1/2 Average Risk              3.4        3.3                  Average Risk              5.0        4.4               2X Average Risk              9.6        7.1               3X Average Risk             23.4       11.0     Use the calculated Patient Ratio above and the CHD Risk table      to determine the patient's CHD Risk.     ATP III Classification (LDL):           < 100        mg/dL         Optimal          100 - 129     mg/dL         Near or Above Optimal          130 - 159     mg/dL         Borderline High          160 - 189     mg/dL         High           > 190        mg/dL         Very High        VITAMIN B12     Status: Abnormal   Collection Time    10/27/12  9:41 AM      Result Value Range   Vitamin B-12 1362 (*) 211 - 911 pg/mL  HEPATIC FUNCTION PANEL     Status: None   Collection Time    10/27/12  9:41 AM      Result Value Range   Total Bilirubin 1.0  0.3 - 1.2 mg/dL   Bilirubin, Direct 0.2  0.0 - 0.3 mg/dL   Indirect Bilirubin 0.8  0.0 - 0.9 mg/dL   Alkaline Phosphatase 74  39 - 117 U/L   AST 21  0 - 37 U/L   ALT 18  0 - 35 U/L   Total Protein 6.8  6.0 - 8.3 g/dL   Albumin 4.2  3.5 - 5.2 g/dL  HEMOGLOBIN A1C     Status: Abnormal   Collection Time    10/27/12  9:41 AM      Result Value Range   Hemoglobin A1C 7.3 (*) <5.7 %   Comment:                                                                            According to the ADA  Clinical Practice Recommendations for 2011, when     HbA1c is used as a screening test:             >=6.5%   Diagnostic of Diabetes Mellitus                (if abnormal result is confirmed)           5.7-6.4%   Increased risk of developing Diabetes Mellitus           References:Diagnosis and Classification of Diabetes Mellitus,Diabetes     [id number] 1):S62-S69 and Standards of Medical Care in             Diabetes - 2011,Diabetes Care,2011,34 (Suppl 1):S11-S61.         Mean Plasma Glucose 163 (*) <117 mg/dL    Diagnostics: EKG -- sinus bradycardia, no STEMI;   Assessment/Plan: Unstable angina EKG normal in clinic today.  Vitals WNL.  Patient to follow-up with cardiology.  RENAL INSUFFICIENCY Recheck BMP at today's visit.

## 2012-11-07 NOTE — Assessment & Plan Note (Signed)
Recheck BMP at today's visit.

## 2012-11-07 NOTE — Assessment & Plan Note (Signed)
EKG normal in clinic today.  Vitals WNL.  Patient to follow-up with cardiology.

## 2012-11-14 ENCOUNTER — Encounter (HOSPITAL_COMMUNITY): Payer: Self-pay | Admitting: Urology

## 2012-11-14 ENCOUNTER — Observation Stay (HOSPITAL_COMMUNITY)
Admission: EM | Admit: 2012-11-14 | Discharge: 2012-11-16 | DRG: 312 | Disposition: A | Discharge status: Home / Self Care | Payer: Medicare Other | Attending: Internal Medicine | Admitting: Internal Medicine

## 2012-11-14 ENCOUNTER — Emergency Department (HOSPITAL_COMMUNITY): Payer: Medicare Other

## 2012-11-14 DIAGNOSIS — E785 Hyperlipidemia, unspecified: Secondary | ICD-10-CM

## 2012-11-14 DIAGNOSIS — E538 Deficiency of other specified B group vitamins: Secondary | ICD-10-CM

## 2012-11-14 DIAGNOSIS — Z79899 Other long term (current) drug therapy: Secondary | ICD-10-CM

## 2012-11-14 DIAGNOSIS — Z833 Family history of diabetes mellitus: Secondary | ICD-10-CM

## 2012-11-14 DIAGNOSIS — N390 Urinary tract infection, site not specified: Secondary | ICD-10-CM | POA: Diagnosis present

## 2012-11-14 DIAGNOSIS — E1142 Type 2 diabetes mellitus with diabetic polyneuropathy: Secondary | ICD-10-CM | POA: Diagnosis present

## 2012-11-14 DIAGNOSIS — E11319 Type 2 diabetes mellitus with unspecified diabetic retinopathy without macular edema: Secondary | ICD-10-CM | POA: Diagnosis present

## 2012-11-14 DIAGNOSIS — R0989 Other specified symptoms and signs involving the circulatory and respiratory systems: Secondary | ICD-10-CM

## 2012-11-14 DIAGNOSIS — Z8249 Family history of ischemic heart disease and other diseases of the circulatory system: Secondary | ICD-10-CM

## 2012-11-14 DIAGNOSIS — E1139 Type 2 diabetes mellitus with other diabetic ophthalmic complication: Secondary | ICD-10-CM | POA: Diagnosis present

## 2012-11-14 DIAGNOSIS — R5383 Other fatigue: Secondary | ICD-10-CM

## 2012-11-14 DIAGNOSIS — N289 Disorder of kidney and ureter, unspecified: Secondary | ICD-10-CM

## 2012-11-14 DIAGNOSIS — M25559 Pain in unspecified hip: Secondary | ICD-10-CM

## 2012-11-14 DIAGNOSIS — I200 Unstable angina: Secondary | ICD-10-CM

## 2012-11-14 DIAGNOSIS — I2510 Atherosclerotic heart disease of native coronary artery without angina pectoris: Secondary | ICD-10-CM | POA: Diagnosis present

## 2012-11-14 DIAGNOSIS — Z794 Long term (current) use of insulin: Secondary | ICD-10-CM

## 2012-11-14 DIAGNOSIS — R159 Full incontinence of feces: Secondary | ICD-10-CM

## 2012-11-14 DIAGNOSIS — F0390 Unspecified dementia without behavioral disturbance: Secondary | ICD-10-CM | POA: Diagnosis present

## 2012-11-14 DIAGNOSIS — R0781 Pleurodynia: Secondary | ICD-10-CM

## 2012-11-14 DIAGNOSIS — R079 Chest pain, unspecified: Secondary | ICD-10-CM

## 2012-11-14 DIAGNOSIS — Z0000 Encounter for general adult medical examination without abnormal findings: Secondary | ICD-10-CM

## 2012-11-14 DIAGNOSIS — Z823 Family history of stroke: Secondary | ICD-10-CM

## 2012-11-14 DIAGNOSIS — I739 Peripheral vascular disease, unspecified: Secondary | ICD-10-CM | POA: Diagnosis present

## 2012-11-14 DIAGNOSIS — R001 Bradycardia, unspecified: Secondary | ICD-10-CM

## 2012-11-14 DIAGNOSIS — Z7982 Long term (current) use of aspirin: Secondary | ICD-10-CM

## 2012-11-14 DIAGNOSIS — E1149 Type 2 diabetes mellitus with other diabetic neurological complication: Secondary | ICD-10-CM | POA: Diagnosis present

## 2012-11-14 DIAGNOSIS — Z87891 Personal history of nicotine dependence: Secondary | ICD-10-CM

## 2012-11-14 DIAGNOSIS — I10 Essential (primary) hypertension: Secondary | ICD-10-CM | POA: Diagnosis present

## 2012-11-14 DIAGNOSIS — K263 Acute duodenal ulcer without hemorrhage or perforation: Secondary | ICD-10-CM

## 2012-11-14 DIAGNOSIS — K922 Gastrointestinal hemorrhage, unspecified: Secondary | ICD-10-CM

## 2012-11-14 DIAGNOSIS — E119 Type 2 diabetes mellitus without complications: Secondary | ICD-10-CM

## 2012-11-14 DIAGNOSIS — H3400 Transient retinal artery occlusion, unspecified eye: Secondary | ICD-10-CM

## 2012-11-14 DIAGNOSIS — Z7902 Long term (current) use of antithrombotics/antiplatelets: Secondary | ICD-10-CM

## 2012-11-14 DIAGNOSIS — I498 Other specified cardiac arrhythmias: Secondary | ICD-10-CM | POA: Diagnosis present

## 2012-11-14 DIAGNOSIS — Z8673 Personal history of transient ischemic attack (TIA), and cerebral infarction without residual deficits: Secondary | ICD-10-CM

## 2012-11-14 DIAGNOSIS — Z951 Presence of aortocoronary bypass graft: Secondary | ICD-10-CM

## 2012-11-14 DIAGNOSIS — I6350 Cerebral infarction due to unspecified occlusion or stenosis of unspecified cerebral artery: Secondary | ICD-10-CM

## 2012-11-14 DIAGNOSIS — Z23 Encounter for immunization: Secondary | ICD-10-CM | POA: Insufficient documentation

## 2012-11-14 DIAGNOSIS — K3183 Achlorhydria: Secondary | ICD-10-CM

## 2012-11-14 DIAGNOSIS — N259 Disorder resulting from impaired renal tubular function, unspecified: Secondary | ICD-10-CM

## 2012-11-14 DIAGNOSIS — D649 Anemia, unspecified: Secondary | ICD-10-CM

## 2012-11-14 DIAGNOSIS — R55 Syncope and collapse: Principal | ICD-10-CM | POA: Diagnosis present

## 2012-11-14 DIAGNOSIS — R011 Cardiac murmur, unspecified: Secondary | ICD-10-CM

## 2012-11-14 DIAGNOSIS — D62 Acute posthemorrhagic anemia: Secondary | ICD-10-CM

## 2012-11-14 LAB — URINALYSIS, ROUTINE W REFLEX MICROSCOPIC
Glucose, UA: 100 mg/dL — AB
Hgb urine dipstick: NEGATIVE
Nitrite: NEGATIVE
Specific Gravity, Urine: 1.017 (ref 1.005–1.030)
pH: 5 (ref 5.0–8.0)

## 2012-11-14 LAB — COMPREHENSIVE METABOLIC PANEL
ALT: 19 U/L (ref 0–35)
AST: 18 U/L (ref 0–37)
Albumin: 3.3 g/dL — ABNORMAL LOW (ref 3.5–5.2)
Calcium: 9.5 mg/dL (ref 8.4–10.5)
Sodium: 138 mEq/L (ref 135–145)
Total Protein: 7 g/dL (ref 6.0–8.3)

## 2012-11-14 LAB — CBC WITH DIFFERENTIAL/PLATELET
Basophils Absolute: 0.1 10*3/uL (ref 0.0–0.1)
Basophils Relative: 1 % (ref 0–1)
Eosinophils Absolute: 0.2 10*3/uL (ref 0.0–0.7)
Eosinophils Relative: 3 % (ref 0–5)
Lymphocytes Relative: 23 % (ref 12–46)
MCH: 31.3 pg (ref 26.0–34.0)
MCHC: 35.8 g/dL (ref 30.0–36.0)
MCV: 87.2 fL (ref 78.0–100.0)
Platelets: 192 10*3/uL (ref 150–400)
RDW: 13.3 % (ref 11.5–15.5)
WBC: 7.4 10*3/uL (ref 4.0–10.5)

## 2012-11-14 LAB — URINE MICROSCOPIC-ADD ON

## 2012-11-14 LAB — PROTIME-INR
INR: 1.06 (ref 0.00–1.49)
Prothrombin Time: 13.6 seconds (ref 11.6–15.2)

## 2012-11-14 MED ORDER — CEPHALEXIN 250 MG PO CAPS
500.0000 mg | ORAL_CAPSULE | Freq: Once | ORAL | Status: AC
  Administered 2012-11-14: 500 mg via ORAL
  Filled 2012-11-14: qty 2

## 2012-11-14 MED ORDER — SODIUM CHLORIDE 0.9 % IV SOLN
INTRAVENOUS | Status: DC
  Administered 2012-11-14: 23:00:00 via INTRAVENOUS

## 2012-11-14 MED ORDER — PNEUMOCOCCAL VAC POLYVALENT 25 MCG/0.5ML IJ INJ
0.5000 mL | INJECTION | INTRAMUSCULAR | Status: AC
  Administered 2012-11-15: 0.5 mL via INTRAMUSCULAR
  Filled 2012-11-14: qty 0.5

## 2012-11-14 NOTE — ED Notes (Signed)
Pt to ED for evaluation of seizure like activity that happened this afternoon.  Per family pt looked pale and they asked her how she felt, pts head then dropped and left arm contracted and had seziure activity lasting about 20 seconds.  Pt was confused afterwards for about 3 minutes- upon EMS arrival pt alert and oriented X 4.  Pt has no complaints at this time.  Pt states she thought she may of "eaten something bad this morning because stomach was upset".

## 2012-11-14 NOTE — Progress Notes (Signed)
Pt arrived to floor with son in no apparent distress. VSS. Pt alert and oriented. Pt placed on high fall risk, yellow armband and red socks placed and bed alarm turned on. Pt educated on floor and call bell and verbalized understanding. Admitting MD paged per order. Shelby Mattocks, RN

## 2012-11-14 NOTE — ED Provider Notes (Signed)
CSN: [id number]     Arrival date & time 11/14/12  1506 History   First MD Initiated Contact with Patient 11/14/12 1512     Chief Complaint  Patient presents with  . Seizures   (Consider location/radiation/quality/duration/timing/severity/associated sxs/prior Treatment) HPI History obtained from the patient and her son-in-law. He reports they keep the house cool and 76 and his mother-in-law wanted to go outside to sit in the sun because she felt cold. He states that they then sat down outside to eat and she started complaining of feeling bad and he states she looked bad. He states her head started to fall forward and she became unconscious. She remained sitting in her chair and shortly after that she had some drawing and jerking of her left upper extremity. He states he ran into the house to call 911 and his wife states that patient had some left facial drawing. He estimates jerking only lasted about 20 seconds and the unconsciousness only lasted about a minute. She then awakened and was able to walk into the house to meet the first responders. They report she was sweating however it was hot outside. Patient states she remembers this morning and she remembers sitting down to eat and she "felt bad". She states "my head felt funny". She's also described nausea stating her abdomen was queasy. She states the next thing she knew she woke up and she was at the table and then EMS was there. She states she feels fine now. She denies headache, chest pain, shortness of breath, nausea, vomiting, numbness or tingling in her extremities. Son states about 3 weeks ago she went to her doctor and told him she thought she had a UTI. He states when she started looking bad today he asked her if she felt like she still had her UTI and she stated she didn't know what he was talking about. He states that she seemed to be a little bit confused.  PCP Leeanne Rio [facility] Velora Heckler, Dr Johnsie Cancel  Past Medical History   Diagnosis Date  . PVD (peripheral vascular disease)   . CAD (coronary artery disease)     a. s/p CABG x 4 (LIMA->LAD, VG->Diag, VG->OM1->OM3);  b. 2010 Cath: 3/4 patent grafts (VG->diag occluded), 3vd, sev PVD ->Med Rx;  c. 12/2011 Lexi MV: sm area of mild mid and apical ant wall ischemia ->med rx.  . Hypertension   . Hyperlipidemia   . Duodenal ulcer 2009 and 11/2011  . Iron deficiency anemia secondary to blood loss (chronic)   . Amaurosis fugax   . Carotid bruit   . Diabetes mellitus type II   . Dementia     pt denies  . Diabetic retinopathy   . Gallstones 2009    seen on CT scan  . Diverticulosis   . Atherosclerosis   . Renal insufficiency   . CVA (cerebral infarction)   . Diabetic peripheral neuropathy   . GI bleed 04/2011, 11/2011    Cause not defined.   . Colon polyp    Past Surgical History  Procedure Laterality Date  . Coronary artery bypass graft  1999    x 4  . Abdominal hysterectomy    . Upper gastrointestinal endoscopy  02/22/2008    w/biopsy, duedenal ulcer, antrum erosions  . Esophagogastroduodenoscopy  05/16/2011    Procedure: ESOPHAGOGASTRODUODENOSCOPY (EGD);  Surgeon: Gatha Mayer, MD;  Location: [facility] ENDOSCOPY;  Service: Endoscopy;  Laterality: N/A;  . Colonoscopy  05/20/2011    Procedure: COLONOSCOPY;  Surgeon:  Scarlette Shorts, MD;  Location: [facility] ENDOSCOPY;  Service: Endoscopy;  Laterality: N/A;  . Esophagogastroduodenoscopy  12/08/2011    Procedure: ESOPHAGOGASTRODUODENOSCOPY (EGD);  Surgeon: Ladene Artist, MD,FACG;  Location: [facility] ENDOSCOPY;  Service: Endoscopy;  Laterality: N/A;   Family History  Problem Relation Age of Onset  . Coronary artery disease Father   . Hypertension Father   . Stroke Father   . Cancer Mother     ?  . Diabetes Maternal Uncle   . Colon cancer Neg Hx    History  Substance Use Topics  . Smoking status: Former Smoker    Types: Cigarettes    Quit date: 12/31/2000  . Smokeless tobacco: Never Used  . Alcohol Use: No   Lives at  home Lives with daughter  OB History   Grav Para Term Preterm Abortions TAB SAB Ect Mult Living                 Review of Systems  All other systems reviewed and are negative.    Allergies  Review of patient's allergies indicates no known allergies.  Home Medications   Current Outpatient Rx  Name  Route  Sig  Dispense  Refill  . amLODipine (NORVASC) 10 MG tablet   Oral   Take 1 tablet (10 mg total) by mouth daily.   30 tablet   3   . aspirin 81 MG tablet   Oral   Take 1 tablet (81 mg total) by mouth daily.   30 tablet      . Cholecalciferol (VITAMIN D) 1000 UNITS capsule   Oral   Take 1,000 Units by mouth every evening.          . clopidogrel (PLAVIX) 75 MG tablet      TAKE 1 TABLET BY MOUTH DAILY.   90 tablet   1   . colesevelam (WELCHOL) 625 MG tablet   Oral   Take 3 tablets (1,875 mg total) by mouth daily.   270 tablet   1   . donepezil (ARICEPT) 10 MG tablet   Oral   Take 1 tablet (10 mg total) by mouth at bedtime as needed.   30 tablet   5   . Ferrous Sulfate (IRON) 325 (65 FE) MG TABS   Oral   Take 325 mg by mouth 2 (two) times daily.          . furosemide (LASIX) 20 MG tablet   Oral   Take 1 tablet (20 mg total) by mouth daily.   30 tablet   2   . Glucosamine-Chondroit-Vit C-Mn (GLUCOSAMINE CHONDR 500 COMPLEX) CAPS   Oral   Take 1 capsule by mouth daily.          . Insulin Glargine 100 UNIT/ML SOPN   Subcutaneous   Inject 35 Units into the skin at bedtime.          . insulin lispro (HUMALOG KWIKPEN) 100 unit/mL SOLN      INJECT 5-10 UNITS INTO THE SKIN 3 (THREE) TIMES DAILY BEFORE MEALS. Sliding scale.   45 mL   1   . isosorbide mononitrate (IMDUR) 60 MG 24 hr tablet   Oral   Take 1 tablet (60 mg total) by mouth daily.   90 tablet   1   . NITROSTAT 0.4 MG SL tablet      PLACE 1 TABLET (0.4 MG TOTAL) UNDER THE TONGUE EVERY 5 (FIVE) MINUTES AS NEEDED.   25 tablet   0   .  Omega-3 Fatty Acids (FISH OIL) 1200 MG  CAPS   Oral   Take 1 capsule by mouth daily.         . pantoprazole (PROTONIX) 40 MG tablet   Oral   Take 1 tablet (40 mg total) by mouth daily.   90 tablet   1   . quinapril (ACCUPRIL) 40 MG tablet   Oral   Take 0.5 tablets (20 mg total) by mouth daily.   45 tablet   1     PLEASE NOTE DIRECTION CHANGE   . rosuvastatin (CRESTOR) 20 MG tablet      TAKE 1 TABLET BY MOUTH AT BEDTIME.   90 tablet   0   . vitamin B-12 (CYANOCOBALAMIN) 1000 MCG tablet   Oral   Take 1,000 mcg by mouth daily.          BP 111/60  Pulse 56  Temp(Src) 97.6 F (36.4 C) (Oral)  Resp 18  SpO2 97%  Vital signs normal except bradycardia  Physical Exam  Nursing note and vitals reviewed. Constitutional: She is oriented to person, place, and time. She appears well-developed and well-nourished.  Non-toxic appearance. She does not appear ill. No distress.  HENT:  Head: Normocephalic and atraumatic.  Right Ear: External ear normal.  Left Ear: External ear normal.  Nose: Nose normal. No mucosal edema or rhinorrhea.  Mouth/Throat: Oropharynx is clear and moist and mucous membranes are normal. No dental abscesses or edematous.  Eyes: Conjunctivae and EOM are normal. Pupils are equal, round, and reactive to light.  Neck: Normal range of motion and full passive range of motion without pain. Neck supple.  No bruits  Cardiovascular: Normal rate, regular rhythm and normal heart sounds.  Exam reveals no gallop and no friction rub.   No murmur heard. Pulmonary/Chest: Effort normal and breath sounds normal. No respiratory distress. She has no wheezes. She has no rhonchi. She has no rales. She exhibits no tenderness and no crepitus.  Abdominal: Soft. Normal appearance and bowel sounds are normal. She exhibits no distension. There is no tenderness. There is no rebound and no guarding.  Musculoskeletal: Normal range of motion. She exhibits no edema and no tenderness.  Moves all extremities well.    Neurological: She is alert and oriented to person, place, and time. She has normal strength. No cranial nerve deficit.  Pt knows the day of the week, the correct month, where she is, and guessed the year as 2012 then got 2014. No pronator drift. Motor intact bilaterally (actually lifts both legs off the stretcher and the same time and holds them up without difficulty). No abn FTN bilaterally.   Skin: Skin is warm, dry and intact. No rash noted. No erythema. No pallor.  Psychiatric: She has a normal mood and affect. Her speech is normal and behavior is normal. Her mood appears not anxious.    ED Course  Procedures (including critical care time) Medications  cephALEXin (KEFLEX) capsule 500 mg (not administered)     Pt has felt well in the ED, no more episodes.  Pt given results of her tests. We discussed admission for her syncopal episode and she is agreeable.  20:26 Dr Sheran Luz, admit to tele, team 10  Labs Review  Results for orders placed during the hospital encounter of 11/14/12  CBC WITH DIFFERENTIAL      Result Value Range   WBC 7.4  4.0 - 10.5 K/uL   RBC 3.84 (*) 3.87 - 5.11 MIL/uL   Hemoglobin 12.0  12.0 - 15.0 g/dL   HCT 33.5 (*) 36.0 - 46.0 %   MCV 87.2  78.0 - 100.0 fL   MCH 31.3  26.0 - 34.0 pg   MCHC 35.8  30.0 - 36.0 g/dL   RDW 13.3  11.5 - 15.5 %   Platelets 192  150 - 400 K/uL   Neutrophils Relative % 63  43 - 77 %   Neutro Abs 4.7  1.7 - 7.7 K/uL   Lymphocytes Relative 23  12 - 46 %   Lymphs Abs 1.7  0.7 - 4.0 K/uL   Monocytes Relative 10  3 - 12 %   Monocytes Absolute 0.8  0.1 - 1.0 K/uL   Eosinophils Relative 3  0 - 5 %   Eosinophils Absolute 0.2  0.0 - 0.7 K/uL   Basophils Relative 1  0 - 1 %   Basophils Absolute 0.1  0.0 - 0.1 K/uL  COMPREHENSIVE METABOLIC PANEL      Result Value Range   Sodium 138  135 - 145 mEq/L   Potassium 3.9  3.5 - 5.1 mEq/L   Chloride 103  96 - 112 mEq/L   CO2 23  19 - 32 mEq/L   Glucose, Bld 116 (*) 70 - 99 mg/dL   BUN  21  6 - 23 mg/dL   Creatinine, Ser 1.46 (*) 0.50 - 1.10 mg/dL   Calcium 9.5  8.4 - 10.5 mg/dL   Total Protein 7.0  6.0 - 8.3 g/dL   Albumin 3.3 (*) 3.5 - 5.2 g/dL   AST 18  0 - 37 U/L   ALT 19  0 - 35 U/L   Alkaline Phosphatase 67  39 - 117 U/L   Total Bilirubin 0.8  0.3 - 1.2 mg/dL   GFR calc non Af Amer 33 (*) >90 mL/min   GFR calc Af Amer 38 (*) >90 mL/min  TROPONIN I      Result Value Range   Troponin I <0.30  <0.30 ng/mL  APTT      Result Value Range   aPTT 29  24 - 37 seconds  PROTIME-INR      Result Value Range   Prothrombin Time 13.6  11.6 - 15.2 seconds   INR 1.06  0.00 - 1.49  URINALYSIS, ROUTINE W REFLEX MICROSCOPIC      Result Value Range   Color, Urine AMBER (*) YELLOW   APPearance CLOUDY (*) CLEAR   Specific Gravity, Urine 1.017  1.005 - 1.030   pH 5.0  5.0 - 8.0   Glucose, UA 100 (*) NEGATIVE mg/dL   Hgb urine dipstick NEGATIVE  NEGATIVE   Bilirubin Urine SMALL (*) NEGATIVE   Ketones, ur 15 (*) NEGATIVE mg/dL   Protein, ur >300 (*) NEGATIVE mg/dL   Urobilinogen, UA 0.2  0.0 - 1.0 mg/dL   Nitrite NEGATIVE  NEGATIVE   Leukocytes, UA MODERATE (*) NEGATIVE  URINE MICROSCOPIC-ADD ON      Result Value Range   Squamous Epithelial / LPF FEW (*) RARE   WBC, UA 7-10  <3 WBC/hpf   RBC / HPF 0-2  <3 RBC/hpf   Bacteria, UA FEW (*) RARE   Casts HYALINE CASTS (*) NEGATIVE   Urine-Other MUCOUS PRESENT     Laboratory interpretation all normal except UTI  Imaging Review  Mr Brain Wo Contrast  11/14/2012   CLINICAL DATA:  79 year old female 's with syncope. Seizure versus TIA. Left face and left upper extremity involvement.  EXAM: MRI HEAD WITHOUT CONTRAST  TECHNIQUE: Multiplanar, multisequence MR imaging was performed. No intravenous contrast was administered.  COMPARISON:  Head CT 05/14/2011. Brain MRI 01/08/2011.  FINDINGS: Stable cerebral volume since 2012. Multifocal chronic infarcts in the brain, including confluent left Corona radiata-internal capsule, and right  PCA/parietal lobe involvement with chronic encephalomalacia.  No restricted diffusion or evidence of acute infarction. Major intracranial vascular flow voids are stable including loss of the distal right vertebral artery flow void.  Stable signal in the brain since 2012. No acute intracranial hemorrhage identified. Insert stable vents Stable partially empty sella configuration. Negative cervicomedullary junction and visualized cervical spine.  Normal bone marrow signal. No acute orbit findings. Visualized paranasal sinuses and mastoids are clear. Negative scalp soft tissues.  IMPRESSION: No acute intracranial abnormality. Stable brain with chronic ischemic disease compared to 01/08/2011.   Electronically Signed   By: Lars Pinks   On: 11/14/2012 19:24     Date: 11/14/2012  Rate: 56  Rhythm: sinus bradycardia  QRS Axis: normal  Intervals: normal  ST/T Wave abnormalities: normal  Conduction Disutrbances:RVH or right VCD or IRBBB  Narrative Interpretation:   Old EKG Reviewed: unchanged from 06/15/2012 HR 57    MDM  patient has coronary artery disease and had an episode today while sitting outside where she had a syncopal event while sitting. She remained in a sitting position and had brief episode of some twitching which may have been from her syncope. Her MRI does not show an acute stroke. She does have a persistent UTI. She's going to be admitted for observation for possible bradycardia or arrhythmia.    1. UTI (urinary tract infection)   2. Renal insufficiency   3. Syncope   4. Bradycardia    Plan admission   Rolland Porter, MD, Alanson Aly, MD 11/14/12 2033

## 2012-11-14 NOTE — ED Notes (Signed)
Pt from home, c/o focal seizures. Per family pt had left sided facial droop and left upper arm seizure activity. Per EMS pt was confused on scene, pt alert and oriented en route.

## 2012-11-14 NOTE — Progress Notes (Signed)
Pt HR sustaining in 40s. Pt asymptomatic and resting/asleep. When pt awoke, HR came up to low 50s. MD notified. Shelby Mattocks, RN

## 2012-11-15 ENCOUNTER — Inpatient Hospital Stay (HOSPITAL_COMMUNITY): Payer: Medicare Other

## 2012-11-15 DIAGNOSIS — E1149 Type 2 diabetes mellitus with other diabetic neurological complication: Secondary | ICD-10-CM

## 2012-11-15 DIAGNOSIS — N390 Urinary tract infection, site not specified: Secondary | ICD-10-CM

## 2012-11-15 DIAGNOSIS — R55 Syncope and collapse: Secondary | ICD-10-CM

## 2012-11-15 DIAGNOSIS — I2510 Atherosclerotic heart disease of native coronary artery without angina pectoris: Secondary | ICD-10-CM

## 2012-11-15 LAB — BASIC METABOLIC PANEL
BUN: 27 mg/dL — ABNORMAL HIGH (ref 6–23)
Creatinine, Ser: 1.42 mg/dL — ABNORMAL HIGH (ref 0.50–1.10)
GFR calc non Af Amer: 34 mL/min — ABNORMAL LOW (ref >90)
Glucose, Bld: 186 mg/dL — ABNORMAL HIGH (ref 70–99)
Potassium: 3.7 mEq/L (ref 3.5–5.1)

## 2012-11-15 LAB — GLUCOSE, CAPILLARY
Glucose-Capillary: 225 mg/dL — ABNORMAL HIGH (ref 70–99)
Glucose-Capillary: 133 mg/dL — ABNORMAL HIGH (ref 70–99)

## 2012-11-15 LAB — CBC
HCT: 32.1 % — ABNORMAL LOW (ref 36.0–46.0)
Hemoglobin: 11.3 g/dL — ABNORMAL LOW (ref 12.0–15.0)
MCH: 30.8 pg (ref 26.0–34.0)
MCHC: 35.2 g/dL (ref 30.0–36.0)

## 2012-11-15 LAB — TROPONIN I: Troponin I: <0.3 ng/mL (ref <0.30)

## 2012-11-15 MED ORDER — SODIUM CHLORIDE 0.9 % IJ SOLN
3.0000 mL | Freq: Two times a day (BID) | INTRAMUSCULAR | Status: DC
  Administered 2012-11-15 (×3): 3 mL via INTRAVENOUS

## 2012-11-15 MED ORDER — COLESEVELAM HCL 625 MG PO TABS
1875.0000 mg | ORAL_TABLET | Freq: Every day | ORAL | Status: DC
  Administered 2012-11-15 – 2012-11-16 (×2): 1875 mg via ORAL
  Filled 2012-11-15 (×3): qty 3

## 2012-11-15 MED ORDER — FERROUS SULFATE 325 (65 FE) MG PO TABS
325.0000 mg | ORAL_TABLET | Freq: Two times a day (BID) | ORAL | Status: DC
  Administered 2012-11-15 – 2012-11-16 (×3): 325 mg via ORAL
  Filled 2012-11-15 (×4): qty 1

## 2012-11-15 MED ORDER — INSULIN ASPART 100 UNIT/ML ~~LOC~~ SOLN
0.0000 [IU] | Freq: Three times a day (TID) | SUBCUTANEOUS | Status: DC
  Administered 2012-11-15 (×2): 3 [IU] via SUBCUTANEOUS

## 2012-11-15 MED ORDER — SODIUM CHLORIDE 0.9 % IV SOLN
INTRAVENOUS | Status: DC
  Administered 2012-11-15: 01:00:00 via INTRAVENOUS

## 2012-11-15 MED ORDER — HEPARIN SODIUM (PORCINE) 5000 UNIT/ML IJ SOLN
5000.0000 [IU] | Freq: Three times a day (TID) | INTRAMUSCULAR | Status: DC
  Administered 2012-11-15 – 2012-11-16 (×4): 5000 [IU] via SUBCUTANEOUS
  Filled 2012-11-15 (×7): qty 1

## 2012-11-15 MED ORDER — ISOSORBIDE MONONITRATE ER 60 MG PO TB24
60.0000 mg | ORAL_TABLET | Freq: Every day | ORAL | Status: DC
  Administered 2012-11-15 – 2012-11-16 (×2): 60 mg via ORAL
  Filled 2012-11-15 (×2): qty 1

## 2012-11-15 MED ORDER — INSULIN ASPART 100 UNIT/ML ~~LOC~~ SOLN
5.0000 [IU] | Freq: Three times a day (TID) | SUBCUTANEOUS | Status: DC
  Administered 2012-11-15 (×2): 5 [IU] via SUBCUTANEOUS

## 2012-11-15 MED ORDER — QUINAPRIL HCL 10 MG PO TABS
20.0000 mg | ORAL_TABLET | Freq: Every day | ORAL | Status: DC
  Administered 2012-11-15 – 2012-11-16 (×2): 20 mg via ORAL
  Filled 2012-11-15 (×2): qty 2

## 2012-11-15 MED ORDER — CLOPIDOGREL BISULFATE 75 MG PO TABS
75.0000 mg | ORAL_TABLET | Freq: Every day | ORAL | Status: DC
  Administered 2012-11-15 – 2012-11-16 (×2): 75 mg via ORAL
  Filled 2012-11-15 (×3): qty 1

## 2012-11-15 MED ORDER — AMLODIPINE BESYLATE 10 MG PO TABS
10.0000 mg | ORAL_TABLET | Freq: Every day | ORAL | Status: DC
  Administered 2012-11-15 – 2012-11-16 (×2): 10 mg via ORAL
  Filled 2012-11-15 (×2): qty 1

## 2012-11-15 MED ORDER — VITAMIN B-12 1000 MCG PO TABS
1000.0000 ug | ORAL_TABLET | Freq: Every day | ORAL | Status: DC
  Administered 2012-11-15 – 2012-11-16 (×2): 1000 ug via ORAL
  Filled 2012-11-15 (×2): qty 1

## 2012-11-15 MED ORDER — INSULIN ASPART 100 UNIT/ML ~~LOC~~ SOLN: 5.0000 [IU] | Freq: Three times a day (TID) | SUBCUTANEOUS | Status: DC

## 2012-11-15 MED ORDER — ASPIRIN 81 MG PO CHEW
81.0000 mg | CHEWABLE_TABLET | Freq: Every day | ORAL | Status: DC
  Administered 2012-11-15 – 2012-11-16 (×2): 81 mg via ORAL
  Filled 2012-11-15 (×2): qty 1

## 2012-11-15 MED ORDER — PANTOPRAZOLE SODIUM 40 MG PO TBEC
40.0000 mg | DELAYED_RELEASE_TABLET | Freq: Every day | ORAL | Status: DC
  Administered 2012-11-15 – 2012-11-16 (×2): 40 mg via ORAL
  Filled 2012-11-15 (×2): qty 1

## 2012-11-15 MED ORDER — INSULIN GLARGINE 100 UNIT/ML ~~LOC~~ SOLN
35.0000 [IU] | Freq: Every day | SUBCUTANEOUS | Status: DC
  Administered 2012-11-15 (×2): 35 [IU] via SUBCUTANEOUS
  Filled 2012-11-15 (×3): qty 0.35

## 2012-11-15 MED ORDER — ATORVASTATIN CALCIUM 40 MG PO TABS
40.0000 mg | ORAL_TABLET | Freq: Every day | ORAL | Status: DC
  Administered 2012-11-15: 18:00:00 40 mg via ORAL
  Filled 2012-11-15 (×2): qty 1

## 2012-11-15 NOTE — Progress Notes (Addendum)
Patient admitted few hours ago today, was admitted with loss of consciousness lasting at best a few minutes with left upper arm jerking, question if this was partial complex seizure, patient has multiple episodes of ischemic brain injury with TIAs and CVAs in the past, MRI in the ER was unremarkable for any acute stroke.  She's currently symptom free and lying in bed in no discomfort, she is diabetic and upon admission glucose levels were stable, TIA workup has been ordered in the ER which includes echogram and carotid duplex, recent A1c and lipid panel done a few months ago noted, we will complete the workup, for now continue antiplatelet agents for secondary prevention, I have discussed the case with neurologist to evaluate the patient for possible seizure. I have ordered EEG.   Lab Results  Component Value Date   HGBA1C 7.3* 10/27/2012    Lab Results  Component Value Date   CHOL 160 10/27/2012   HDL 45 10/27/2012   LDLCALC 74 10/27/2012   LDLDIRECT 97.1 02/26/2012   TRIG 204* 10/27/2012   CHOLHDL 3.6 10/27/2012

## 2012-11-15 NOTE — Progress Notes (Signed)
Patient has returned to unit from radiology, the patient is stable; will continue to monitor patient. Ruben Im RN

## 2012-11-15 NOTE — Progress Notes (Signed)
Utilization Review Completed.   Timira Bieda, RN, BSN Nurse Case Manager  [phone]  

## 2012-11-15 NOTE — H&P (Addendum)
[facility] History and Physical  DEMAYA [name] [id number] DOB: 04-15-1931 DOA: 11/14/2012  Referring physician: ED PCP: Penni Homans, MD  Chief Complaint: Syncope / Seizure  HPI: Destiny [name] is a 79 y.o. female who had not been feeling well all day, she wanted to go sit outside with family because her house was cold she states.  After sitting down to eat she states family noted that she looked "ill" and she states she "felt bad", after this she proceeded to have a syncopal episode with 20 seconds of seizure like activity of there LUE.  Patient woke up after this with L sided weakness for a short while after as first responders arrived at the scene.  By the time she arrived in the ED her L sided symptoms had completely resolved and she actually is now feeling better than she has all day she states.  When asked to further qualify the symptoms of "feeling ill" she had earlier she isnt really able to expand on this: no chest pain, no shortness of breath, no N/V/D.  In the ED MRI brain is negative, hospitalist has been asked to admit.  Review of Systems: 12 systems reviewed and otherwise negative.  Past Medical History  Diagnosis Date  . PVD (peripheral vascular disease)   . CAD (coronary artery disease)     a. s/p CABG x 4 (LIMA->LAD, VG->Diag, VG->OM1->OM3);  b. 2010 Cath: 3/4 patent grafts (VG->diag occluded), 3vd, sev PVD ->Med Rx;  c. 12/2011 Lexi MV: sm area of mild mid and apical ant wall ischemia ->med rx.  . Hypertension   . Hyperlipidemia   . Duodenal ulcer 2009 and 11/2011  . Iron deficiency anemia secondary to blood loss (chronic)   . Amaurosis fugax   . Carotid bruit   . Diabetes mellitus type II   . Dementia     pt denies  . Diabetic retinopathy   . Gallstones 2009    seen on CT scan  . Diverticulosis   . Atherosclerosis   . Renal insufficiency   . CVA (cerebral infarction)   . Diabetic peripheral neuropathy   . GI bleed 04/2011, 11/2011    Cause not  defined.   . Colon polyp    Past Surgical History  Procedure Laterality Date  . Coronary artery bypass graft  1999    x 4  . Abdominal hysterectomy    . Upper gastrointestinal endoscopy  02/22/2008    w/biopsy, duedenal ulcer, antrum erosions  . Esophagogastroduodenoscopy  05/16/2011    Procedure: ESOPHAGOGASTRODUODENOSCOPY (EGD);  Surgeon: Gatha Mayer, MD;  Location: [facility] ENDOSCOPY;  Service: Endoscopy;  Laterality: N/A;  . Colonoscopy  05/20/2011    Procedure: COLONOSCOPY;  Surgeon: Scarlette Shorts, MD;  Location: [place];  Service: Endoscopy;  Laterality: N/A;  . Esophagogastroduodenoscopy  12/08/2011    Procedure: ESOPHAGOGASTRODUODENOSCOPY (EGD);  Surgeon: Ladene Artist, MD,FACG;  Location: [facility] ENDOSCOPY;  Service: Endoscopy;  Laterality: N/A;   Social History:  reports that she quit smoking about 11 years ago. Her smoking use included Cigarettes. She smoked 0.00 packs per day. She has never used smokeless tobacco. She reports that she does not drink alcohol or use illicit drugs.   No Known Allergies  Family History  Problem Relation Age of Onset  . Coronary artery disease Father   . Hypertension Father   . Stroke Father   . Cancer Mother     ?  . Diabetes Maternal Uncle   . Colon cancer Neg Hx  Prior to Admission medications   Medication Sig Start Date End Date Taking? Authorizing Provider  amLODipine (NORVASC) 10 MG tablet Take 1 tablet (10 mg total) by mouth daily. 10/27/12  Yes Leeanne Rio, PA-C  aspirin 81 MG tablet Take 1 tablet (81 mg total) by mouth daily. 06/07/12  Yes Rogelia Mire, NP  Cholecalciferol (VITAMIN D) 1000 UNITS capsule Take 1,000 Units by mouth every evening.    Yes Historical Provider, MD  clopidogrel (PLAVIX) 75 MG tablet Take 75 mg by mouth daily.   Yes Historical Provider, MD  colesevelam (WELCHOL) 625 MG tablet Take 3 tablets (1,875 mg total) by mouth daily. 08/09/12  Yes Mosie Lukes, MD  donepezil (ARICEPT) 10 MG tablet Take 1  tablet (10 mg total) by mouth at bedtime as needed. 09/05/12  Yes Debbrah Alar, NP  Ferrous Sulfate (IRON) 325 (65 FE) MG TABS Take 325 mg by mouth 2 (two) times daily.    Yes Historical Provider, MD  furosemide (LASIX) 20 MG tablet Take 1 tablet (20 mg total) by mouth daily. 10/27/12  Yes Leeanne Rio, PA-C  Glucosamine-Chondroit-Vit C-Mn (GLUCOSAMINE CHONDR 500 COMPLEX) CAPS Take 1 capsule by mouth daily.    Yes Historical Provider, MD  insulin glargine (LANTUS) 100 UNIT/ML injection Inject 35 Units into the skin at bedtime.   Yes Historical Provider, MD  insulin lispro (HUMALOG KWIKPEN) 100 UNIT/ML SOPN Inject 5-10 Units into the skin 3 (three) times daily with meals.   Yes Historical Provider, MD  isosorbide mononitrate (IMDUR) 60 MG 24 hr tablet Take 1 tablet (60 mg total) by mouth daily. 08/09/12  Yes Mosie Lukes, MD  nitroGLYCERIN (NITROSTAT) 0.4 MG SL tablet Place 0.4 mg under the tongue every 5 (five) minutes as needed for chest pain.   Yes Historical Provider, MD  Omega-3 Fatty Acids (FISH OIL) 1200 MG CAPS Take 1,200 mg by mouth daily.    Yes Historical Provider, MD  pantoprazole (PROTONIX) 40 MG tablet Take 1 tablet (40 mg total) by mouth daily. 08/10/12  Yes Mosie Lukes, MD  quinapril (ACCUPRIL) 40 MG tablet Take 0.5 tablets (20 mg total) by mouth daily. 08/09/12  Yes Debbrah Alar, NP  rosuvastatin (CRESTOR) 20 MG tablet Take 20 mg by mouth at bedtime.    Yes Historical Provider, MD  vitamin B-12 (CYANOCOBALAMIN) 1000 MCG tablet Take 1,000 mcg by mouth daily.   Yes Historical Provider, MD   Physical Exam: Filed Vitals:   11/14/12 2303  BP: 138/56  Pulse: 50  Temp:   Resp:     General:  NAD, resting comfortably in bed Eyes: PEERLA EOMI ENT: mucous membranes moist Neck: supple w/o JVD, no bruits Cardiovascular: RRR w/o MRG Respiratory: CTA B Abdomen: soft, nt, nd, bs+ Skin: no rash nor lesion Musculoskeletal: MAE, full ROM all 4 extremities Psychiatric:  normal tone and affect Neurologic: AAOx3, grossly non-focal  Labs on Admission:  Basic Metabolic Panel:  Recent Labs Lab 11/14/12 1542  NA 138  K 3.9  CL 103  CO2 23  GLUCOSE 116*  BUN 21  CREATININE 1.46*  CALCIUM 9.5   Liver Function Tests:  Recent Labs Lab 11/14/12 1542  AST 18  ALT 19  ALKPHOS 67  BILITOT 0.8  PROT 7.0  ALBUMIN 3.3*   No results found for this basename: LIPASE, AMYLASE,  in the last 168 hours No results found for this basename: AMMONIA,  in the last 168 hours CBC:  Recent Labs Lab 11/14/12 1542  WBC  7.4  NEUTROABS 4.7  HGB 12.0  HCT 33.5*  MCV 87.2  PLT 192   Cardiac Enzymes:  Recent Labs Lab 11/14/12 1543  TROPONINI <0.30    BNP (last 3 results) No results found for this basename: PROBNP,  in the last 8760 hours CBG: No results found for this basename: GLUCAP,  in the last 168 hours  Radiological Exams on Admission: Mr Brain Wo Contrast  11/14/2012   CLINICAL DATA:  79 year old female 56 with syncope. Seizure versus TIA. Left face and left upper extremity involvement.  EXAM: MRI HEAD WITHOUT CONTRAST  TECHNIQUE: Multiplanar, multisequence MR imaging was performed. No intravenous contrast was administered.  COMPARISON:  Head CT 05/14/2011. Brain MRI 01/08/2011.  FINDINGS: Stable cerebral volume since 2012. Multifocal chronic infarcts in the brain, including confluent left Corona radiata-internal capsule, and right PCA/parietal lobe involvement with chronic encephalomalacia.  No restricted diffusion or evidence of acute infarction. Major intracranial vascular flow voids are stable including loss of the distal right vertebral artery flow void.  Stable signal in the brain since 2012. No acute intracranial hemorrhage identified. Insert stable vents Stable partially empty sella configuration. Negative cervicomedullary junction and visualized cervical spine.  Normal bone marrow signal. No acute orbit findings. Visualized paranasal sinuses and  mastoids are clear. Negative scalp soft tissues.  IMPRESSION: No acute intracranial abnormality. Stable brain with chronic ischemic disease compared to 01/08/2011.   Electronically Signed   By: Lars Pinks   On: 11/14/2012 19:24    EKG: Independently reviewed.  Assessment/Plan Principal Problem:   Syncope Active Problems:   Diabetes mellitus type 2, insulin dependent   1. Syncope - with seizure like activity, patient is felt to be high risk for adverse outcome given her history which includes severe CAD s/p CABG, known carotid artery disease with bruit last ascultated in October of last year but bruit not audible on todays exam however, and known history of amaurosis fugax.  Carotid dopplers have continued to show 60-79% but given multiple TIAs in past, amaurosis fugax in 2009, rechecking them again (they were due to be rechecked in another month anyhow).  Observing patient on tele monitor, serial trops ordered.  MRI brain obtained in ED was negative for acute CVA.  Given the history of known 60-79% stenosis, symptomatic with known amaurosis fugax in 2009, CEA may actually already be indicated in this patient. 2. DM2 - continue home Lantus and SSI, CBG checks AC/HS   Code Status: Full Code (must indicate code status--if unknown or must be presumed, indicate so) Family Communication: No family in room (indicate person spoken with, if applicable, with phone number if by telephone) Disposition Plan: Admit to inpatient (indicate anticipated LOS)  Time spent: 70 min  Phinley Schall M. [facility] Pager [phone]  If 7PM-7AM, please contact night-coverage [email or web address] Password [facility] 11/15/2012, 12:22 AM

## 2012-11-15 NOTE — Progress Notes (Signed)
Patient is being transported to radiology for procedure, the patient is stable.  Ruben Im RN

## 2012-11-15 NOTE — Progress Notes (Signed)
Patient is being transported for procedure, the patient is stable. Ruben Im RN

## 2012-11-15 NOTE — Progress Notes (Signed)
EEG completed; results pending.    

## 2012-11-15 NOTE — Progress Notes (Signed)
Patient has arrived back from her procedure. Patient stable. Destiny [name] R

## 2012-11-15 NOTE — Consult Note (Signed)
NEURO HOSPITALIST CONSULT NOTE    Reason for Consult: seizure  HPI:                                                                                                                                          Destiny [name] is an 79 y.o. female with no history of seizure, however her daughter does have seizure disorder. Patient was sitting on her sons porch yesterday for about 20 minutes.  She admits to not feeling well all day and was not drinking fluids.  Patient stood up and walked about 10 feet to sit down at the table on the porch.  After sitting down she blacked out.  When she regained consciousness, she recalls noting her teeth had fallen out and was able to recognize all the family members around her.  She denies any incontinence or feeling tired after event.  She denies any previous events like this. Currently she feels back to her baseline.   Past Medical History  Diagnosis Date  . PVD (peripheral vascular disease)   . CAD (coronary artery disease)     a. s/p CABG x 4 (LIMA->LAD, VG->Diag, VG->OM1->OM3);  b. 2010 Cath: 3/4 patent grafts (VG->diag occluded), 3vd, sev PVD ->Med Rx;  c. 12/2011 Lexi MV: sm area of mild mid and apical ant wall ischemia ->med rx.  . Hypertension   . Hyperlipidemia   . Duodenal ulcer 2009 and 11/2011  . Iron deficiency anemia secondary to blood loss (chronic)   . Amaurosis fugax   . Carotid bruit   . Diabetes mellitus type II   . Dementia     pt denies  . Diabetic retinopathy   . Gallstones 2009    seen on CT scan  . Diverticulosis   . Atherosclerosis   . Renal insufficiency   . CVA (cerebral infarction)   . Diabetic peripheral neuropathy   . GI bleed 04/2011, 11/2011    Cause not defined.   . Colon polyp     Past Surgical History  Procedure Laterality Date  . Coronary artery bypass graft  1999    x 4  . Abdominal hysterectomy    . Upper gastrointestinal endoscopy  02/22/2008    w/biopsy, duedenal ulcer, antrum erosions   . Esophagogastroduodenoscopy  05/16/2011    Procedure: ESOPHAGOGASTRODUODENOSCOPY (EGD);  Surgeon: Gatha Mayer, MD;  Location: [facility] ENDOSCOPY;  Service: Endoscopy;  Laterality: N/A;  . Colonoscopy  05/20/2011    Procedure: COLONOSCOPY;  Surgeon: Scarlette Shorts, MD;  Location: [place];  Service: Endoscopy;  Laterality: N/A;  . Esophagogastroduodenoscopy  12/08/2011    Procedure: ESOPHAGOGASTRODUODENOSCOPY (EGD);  Surgeon: Ladene Artist, MD,FACG;  Location: [facility] ENDOSCOPY;  Service: Endoscopy;  Laterality: N/A;    Family History  Problem Relation Age of Onset  .  Coronary artery disease Father   . Hypertension Father   . Stroke Father   . Cancer Mother     ?  . Diabetes Maternal Uncle   . Colon cancer Neg Hx      Social History:  reports that she quit smoking about 11 years ago. Her smoking use included Cigarettes. She smoked 0.00 packs per day. She has never used smokeless tobacco. She reports that she does not drink alcohol or use illicit drugs.  No Known Allergies  MEDICATIONS:                                                                                                                     Prior to Admission:  Prescriptions prior to admission  Medication Sig Dispense Refill  . amLODipine (NORVASC) 10 MG tablet Take 1 tablet (10 mg total) by mouth daily.  30 tablet  3  . aspirin 81 MG tablet Take 1 tablet (81 mg total) by mouth daily.  30 tablet    . Cholecalciferol (VITAMIN D) 1000 UNITS capsule Take 1,000 Units by mouth every evening.       . clopidogrel (PLAVIX) 75 MG tablet Take 75 mg by mouth daily.      . colesevelam (WELCHOL) 625 MG tablet Take 3 tablets (1,875 mg total) by mouth daily.  270 tablet  1  . donepezil (ARICEPT) 10 MG tablet Take 1 tablet (10 mg total) by mouth at bedtime as needed.  30 tablet  5  . Ferrous Sulfate (IRON) 325 (65 FE) MG TABS Take 325 mg by mouth 2 (two) times daily.       . furosemide (LASIX) 20 MG tablet Take 1 tablet (20 mg total) by mouth  daily.  30 tablet  2  . Glucosamine-Chondroit-Vit C-Mn (GLUCOSAMINE CHONDR 500 COMPLEX) CAPS Take 1 capsule by mouth daily.       . insulin glargine (LANTUS) 100 UNIT/ML injection Inject 35 Units into the skin at bedtime.      . insulin lispro (HUMALOG KWIKPEN) 100 UNIT/ML SOPN Inject 5-10 Units into the skin 3 (three) times daily with meals.      . isosorbide mononitrate (IMDUR) 60 MG 24 hr tablet Take 1 tablet (60 mg total) by mouth daily.  90 tablet  1  . nitroGLYCERIN (NITROSTAT) 0.4 MG SL tablet Place 0.4 mg under the tongue every 5 (five) minutes as needed for chest pain.      . Omega-3 Fatty Acids (FISH OIL) 1200 MG CAPS Take 1,200 mg by mouth daily.       . pantoprazole (PROTONIX) 40 MG tablet Take 1 tablet (40 mg total) by mouth daily.  90 tablet  1  . quinapril (ACCUPRIL) 40 MG tablet Take 0.5 tablets (20 mg total) by mouth daily.  45 tablet  1  . rosuvastatin (CRESTOR) 20 MG tablet Take 20 mg by mouth at bedtime.       . vitamin B-12 (CYANOCOBALAMIN) 1000 MCG tablet Take 1,000 mcg by mouth daily.  Scheduled: . amLODipine  10 mg Oral Daily  . aspirin  81 mg Oral Daily  . atorvastatin  40 mg Oral q1800  . clopidogrel  75 mg Oral Q breakfast  . colesevelam  1,875 mg Oral Q breakfast  . ferrous sulfate  325 mg Oral BID  . heparin  5,000 Units Subcutaneous Q8H  . insulin aspart  0-9 Units Subcutaneous TID WC  . insulin aspart  5 Units Subcutaneous TID WC  . insulin glargine  35 Units Subcutaneous QHS  . isosorbide mononitrate  60 mg Oral Daily  . pantoprazole  40 mg Oral Daily  . pneumococcal 23 valent vaccine  0.5 mL Intramuscular Tomorrow-1000  . quinapril  20 mg Oral Daily  . sodium chloride  3 mL Intravenous Q12H  . vitamin B-12  1,000 mcg Oral Daily     ROS:                                                                                                                                       History obtained from the patient  General ROS: negative for - chills,  fatigue, fever, night sweats, weight gain or weight loss Psychological ROS: negative for - behavioral disorder, hallucinations, memory difficulties, mood swings or suicidal ideation Ophthalmic ROS: negative for - blurry vision, double vision, eye pain or loss of vision ENT ROS: negative for - epistaxis, nasal discharge, oral lesions, sore throat, tinnitus or vertigo Allergy and Immunology ROS: negative for - hives or itchy/watery eyes Hematological and Lymphatic ROS: negative for - bleeding problems, bruising or swollen lymph nodes Endocrine ROS: negative for - galactorrhea, hair pattern changes, polydipsia/polyuria or temperature intolerance Respiratory ROS: negative for - cough, hemoptysis, shortness of breath or wheezing Cardiovascular ROS: negative for - chest pain, dyspnea on exertion, edema or irregular heartbeat Gastrointestinal ROS: negative for - abdominal pain, diarrhea, hematemesis, nausea/vomiting or stool incontinence Genito-Urinary ROS: negative for - dysuria, hematuria, incontinence or urinary frequency/urgency Musculoskeletal ROS: negative for - joint swelling or muscular weakness Neurological ROS: as noted in HPI Dermatological ROS: negative for rash and skin lesion changes   Blood pressure 142/50, pulse 60, temperature 97.5 F (36.4 C), temperature source Oral, resp. rate 18, height 5\' 6"  (1.676 m), weight 73.755 kg (162 lb 9.6 oz), SpO2 96.00%.   Neurologic Examination:                                                                                                      Mental Status:  Alert, oriented, thought content appropriate.  Speech fluent without evidence of aphasia.  Able to follow 3 step commands without difficulty. Cranial Nerves: II: Discs flat bilaterally; Visual fields grossly normal, pupils equal, round, reactive to light and accommodation III,IV, VI: ptosis present left eye, extra-ocular motions intact bilaterally V,VII: smile symmetric, facial light touch  sensation normal bilaterally VIII: hearing normal bilaterally IX,X: gag reflex present XI: bilateral shoulder shrug XII: midline tongue extension Motor: Right : Upper extremity   5/5    Left:     Upper extremity   5/5  Lower extremity   5/5     Lower extremity   5/5 Tone and bulk:normal tone throughout; no atrophy noted Sensory: Pinprick and light touch intact throughout, bilaterally with stocking distribution neuropathy in LE Deep Tendon Reflexes:  Right: Upper Extremity   Left: Upper extremity   biceps (C-5 to C-6) 2/4   biceps (C-5 to C-6) 2/4 tricep (C7) 2/4    triceps (C7) 2/4 Brachioradialis (C6) 2/4  Brachioradialis (C6) 2/4  Lower Extremity Lower Extremity  quadriceps (L-2 to L-4) 0/4   quadriceps (L-2 to L-4) 0/4 Achilles (S1) 0/4   Achilles (S1) 0/4  Plantars: Bilateral up gooing Cerebellar: normal finger-to-nose,  normal heel-to-shin test Gait: not tested CV: pulses palpable throughout    Lab Results  Component Value Date/Time   CHOL 160 10/27/2012  9:41 AM    Results for orders placed during the hospital encounter of 11/14/12 (from the past 48 hour(s))  CBC WITH DIFFERENTIAL     Status: Abnormal   Collection Time    11/14/12  3:42 PM      Result Value Range   WBC 7.4  4.0 - 10.5 K/uL   RBC 3.84 (*) 3.87 - 5.11 MIL/uL   Hemoglobin 12.0  12.0 - 15.0 g/dL   HCT 33.5 (*) 36.0 - 46.0 %   MCV 87.2  78.0 - 100.0 fL   MCH 31.3  26.0 - 34.0 pg   MCHC 35.8  30.0 - 36.0 g/dL   RDW 13.3  11.5 - 15.5 %   Platelets 192  150 - 400 K/uL   Neutrophils Relative % 63  43 - 77 %   Neutro Abs 4.7  1.7 - 7.7 K/uL   Lymphocytes Relative 23  12 - 46 %   Lymphs Abs 1.7  0.7 - 4.0 K/uL   Monocytes Relative 10  3 - 12 %   Monocytes Absolute 0.8  0.1 - 1.0 K/uL   Eosinophils Relative 3  0 - 5 %   Eosinophils Absolute 0.2  0.0 - 0.7 K/uL   Basophils Relative 1  0 - 1 %   Basophils Absolute 0.1  0.0 - 0.1 K/uL  COMPREHENSIVE METABOLIC PANEL     Status: Abnormal   Collection  Time    11/14/12  3:42 PM      Result Value Range   Sodium 138  135 - 145 mEq/L   Potassium 3.9  3.5 - 5.1 mEq/L   Chloride 103  96 - 112 mEq/L   CO2 23  19 - 32 mEq/L   Glucose, Bld 116 (*) 70 - 99 mg/dL   BUN 21  6 - 23 mg/dL   Creatinine, Ser 1.46 (*) 0.50 - 1.10 mg/dL   Calcium 9.5  8.4 - 10.5 mg/dL   Total Protein 7.0  6.0 - 8.3 g/dL   Albumin 3.3 (*) 3.5 - 5.2 g/dL   AST 18  0 - 37 U/L  ALT 19  0 - 35 U/L   Alkaline Phosphatase 67  39 - 117 U/L   Total Bilirubin 0.8  0.3 - 1.2 mg/dL   GFR calc non Af Amer 33 (*) >90 mL/min   GFR calc Af Amer 38 (*) >90 mL/min   Comment: (NOTE)     The eGFR has been calculated using the CKD EPI equation.     This calculation has not been validated in all clinical situations.     eGFR's persistently <90 mL/min signify possible Chronic Kidney     Disease.  APTT     Status: None   Collection Time    11/14/12  3:42 PM      Result Value Range   aPTT 29  24 - 37 seconds  PROTIME-INR     Status: None   Collection Time    11/14/12  3:42 PM      Result Value Range   Prothrombin Time 13.6  11.6 - 15.2 seconds   INR 1.06  0.00 - 1.49  TROPONIN I     Status: None   Collection Time    11/14/12  3:43 PM      Result Value Range   Troponin I <0.30  <0.30 ng/mL   Comment:            Due to the release kinetics of cTnI,     a negative result within the first hours     of the onset of symptoms does not rule out     myocardial infarction with certainty.     If myocardial infarction is still suspected,     repeat the test at appropriate intervals.  URINALYSIS, ROUTINE W REFLEX MICROSCOPIC     Status: Abnormal   Collection Time    11/14/12  6:59 PM      Result Value Range   Color, Urine AMBER (*) YELLOW   Comment: BIOCHEMICALS MAY BE AFFECTED BY COLOR   APPearance CLOUDY (*) CLEAR   Specific Gravity, Urine 1.017  1.005 - 1.030   pH 5.0  5.0 - 8.0   Glucose, UA 100 (*) NEGATIVE mg/dL   Hgb urine dipstick NEGATIVE  NEGATIVE   Bilirubin Urine  SMALL (*) NEGATIVE   Ketones, ur 15 (*) NEGATIVE mg/dL   Protein, ur >300 (*) NEGATIVE mg/dL   Urobilinogen, UA 0.2  0.0 - 1.0 mg/dL   Nitrite NEGATIVE  NEGATIVE   Leukocytes, UA MODERATE (*) NEGATIVE  URINE MICROSCOPIC-ADD ON     Status: Abnormal   Collection Time    11/14/12  6:59 PM      Result Value Range   Squamous Epithelial / LPF FEW (*) RARE   WBC, UA 7-10  <3 WBC/hpf   RBC / HPF 0-2  <3 RBC/hpf   Bacteria, UA FEW (*) RARE   Casts HYALINE CASTS (*) NEGATIVE   Urine-Other MUCOUS PRESENT    GLUCOSE, CAPILLARY     Status: Abnormal   Collection Time    11/15/12 12:55 AM      Result Value Range   Glucose-Capillary 166 (*) 70 - 99 mg/dL   Comment 1 Notify RN     Comment 2 Documented in Chart    BASIC METABOLIC PANEL     Status: Abnormal   Collection Time    11/15/12  1:59 AM      Result Value Range   Sodium 138  135 - 145 mEq/L   Potassium 3.7  3.5 - 5.1 mEq/L   Chloride  103  96 - 112 mEq/L   CO2 23  19 - 32 mEq/L   Glucose, Bld 186 (*) 70 - 99 mg/dL   BUN 27 (*) 6 - 23 mg/dL   Creatinine, Ser 1.42 (*) 0.50 - 1.10 mg/dL   Calcium 8.9  8.4 - 10.5 mg/dL   GFR calc non Af Amer 34 (*) >90 mL/min   GFR calc Af Amer 40 (*) >90 mL/min   Comment: (NOTE)     The eGFR has been calculated using the CKD EPI equation.     This calculation has not been validated in all clinical situations.     eGFR's persistently <90 mL/min signify possible Chronic Kidney     Disease.  CBC     Status: Abnormal   Collection Time    11/15/12  1:59 AM      Result Value Range   WBC 6.6  4.0 - 10.5 K/uL   RBC 3.67 (*) 3.87 - 5.11 MIL/uL   Hemoglobin 11.3 (*) 12.0 - 15.0 g/dL   HCT 32.1 (*) 36.0 - 46.0 %   MCV 87.5  78.0 - 100.0 fL   MCH 30.8  26.0 - 34.0 pg   MCHC 35.2  30.0 - 36.0 g/dL   RDW 13.4  11.5 - 15.5 %   Platelets 159  150 - 400 K/uL  TROPONIN I     Status: None   Collection Time    11/15/12  1:59 AM      Result Value Range   Troponin I <0.30  <0.30 ng/mL   Comment:             Due to the release kinetics of cTnI,     a negative result within the first hours     of the onset of symptoms does not rule out     myocardial infarction with certainty.     If myocardial infarction is still suspected,     repeat the test at appropriate intervals.  GLUCOSE, CAPILLARY     Status: Abnormal   Collection Time    11/15/12  6:06 AM      Result Value Range   Glucose-Capillary 154 (*) 70 - 99 mg/dL    Mr Brain Wo Contrast  11/14/2012   CLINICAL DATA:  79 year old female 's with syncope. Seizure versus TIA. Left face and left upper extremity involvement.  EXAM: MRI HEAD WITHOUT CONTRAST  TECHNIQUE: Multiplanar, multisequence MR imaging was performed. No intravenous contrast was administered.  COMPARISON:  Head CT 05/14/2011. Brain MRI 01/08/2011.  FINDINGS: Stable cerebral volume since 2012. Multifocal chronic infarcts in the brain, including confluent left Corona radiata-internal capsule, and right PCA/parietal lobe involvement with chronic encephalomalacia.  No restricted diffusion or evidence of acute infarction. Major intracranial vascular flow voids are stable including loss of the distal right vertebral artery flow void.  Stable signal in the brain since 2012. No acute intracranial hemorrhage identified. Insert stable vents Stable partially empty sella configuration. Negative cervicomedullary junction and visualized cervical spine.  Normal bone marrow signal. No acute orbit findings. Visualized paranasal sinuses and mastoids are clear. Negative scalp soft tissues.  IMPRESSION: No acute intracranial abnormality. Stable brain with chronic ischemic disease compared to 01/08/2011.   Electronically Signed   By: Lars Pinks   On: 11/14/2012 19:24   HGBA1C  7.3* LDL 74  Vascular Ultrasound  Carotid Duplex (Doppler) has been completed. Preliminary findings: Right: 40-59% internal carotid artery stenosis, mid end of scale. Left : 40-59% ICA stenosis,  upper end of scale.  Antegrade vertebral  flow.  2 D echo: pending  Etta Quill PA-C [facility] [phone]  11/15/2012, 8:56 AM   Patient seen and examined.  Clinical course and management discussed.  Necessary edits performed.  I agree with the above.  Assessment and plan of care developed and discussed below.    Assessment/Plan: 79 year old female with a history of stroke in the past who presented after a syncopal episode.  Patient reports that she felt as if she was going to pass out then did.  Was in the seated position and remained in the seated position.  Was noted by family to curl up her right arm and shake.  There was no tongue biting or B/B incontinence.  EEG reviewed and unremarkable.  MRI reviewed and shows no acute changes.  On presentation BP somewhat low and bradycardic.  Concerned that shaking may have been secondary to hypoperfusion and that not a true epileptic event.  Would not start anticonvulsant therapy at this time.  Patient to remain on ASA and Plavix.    Recommendations: 1.  Patient unable to drive, operate heavy machinery, perform activities at heights and participate in water activities until released by outpatient physician.  This was discussed with patient and family.  No further neurologic intervention is recommended at this time.  If further questions arise, please call or page at that time.  Thank you for allowing neurology to participate in the care of this patient.   Alexis Goodell, MD [facility] [phone]  11/15/2012  4:18 PM

## 2012-11-15 NOTE — Procedures (Signed)
ELECTROENCEPHALOGRAM REPORT   Patient: Destiny [name]       Room #: [id number]  Age: 79 y.o.        Sex: female Referring Physician: Candiss Norse Report Date:  11/15/2012        Interpreting Physician: Alexis Goodell D  History: Destiny [name] is an 79 y.o. female presenting with an episode of syncope.    Medications:  Scheduled: . amLODipine  10 mg Oral Daily  . aspirin  81 mg Oral Daily  . atorvastatin  40 mg Oral q1800  . clopidogrel  75 mg Oral Q breakfast  . colesevelam  1,875 mg Oral Q breakfast  . ferrous sulfate  325 mg Oral BID  . heparin  5,000 Units Subcutaneous Q8H  . insulin aspart  0-9 Units Subcutaneous TID WC  . insulin aspart  5 Units Subcutaneous TID WC  . insulin glargine  35 Units Subcutaneous QHS  . isosorbide mononitrate  60 mg Oral Daily  . pantoprazole  40 mg Oral Daily  . quinapril  20 mg Oral Daily  . sodium chloride  3 mL Intravenous Q12H  . vitamin B-12  1,000 mcg Oral Daily    Conditions of Recording:  This is a 16 channel EEG carried out with the patient in the awake, drowsy and asleep states.  Description:  The waking background activity consists of a low voltage, symmetrical, fairly well organized, 9 Hz alpha activity, seen from the parieto-occipital and posterior temporal regions.  Low voltage fast activity, poorly organized, is seen anteriorly and is at times superimposed on more posterior regions.  A mixture of theta and alpha rhythms are seen from the central and temporal regions. The patient drowses with slowing to irregular, low voltage theta and beta activity.   The patient goes in to a light sleep with symmetrical sleep spindles, vertex central sharp transients and irregular slow activity.   Hyperventilation and intermittent photic stimulation were not performed.  IMPRESSION: This is a normal EEG   Alexis Goodell, MD [facility] [phone] 11/15/2012, 4:23 PM

## 2012-11-15 NOTE — Progress Notes (Signed)
*  PRELIMINARY RESULTS* Vascular Ultrasound Carotid Duplex (Doppler) has been completed.  Preliminary findings: Right: 40-59% internal carotid artery stenosis, mid end of scale.  Left : 40-59% ICA stenosis, upper end of scale. Antegrade vertebral flow.   Landry Mellow, RDMS, RVT  11/15/2012, 8:39 AM

## 2012-11-15 NOTE — Progress Notes (Signed)
Cosigned for [employer], I/O, med administration, notes, care plan/education. Ruben Im RN

## 2012-11-16 DIAGNOSIS — Z794 Long term (current) use of insulin: Secondary | ICD-10-CM

## 2012-11-16 DIAGNOSIS — I10 Essential (primary) hypertension: Secondary | ICD-10-CM

## 2012-11-16 DIAGNOSIS — E119 Type 2 diabetes mellitus without complications: Secondary | ICD-10-CM

## 2012-11-16 LAB — GLUCOSE, CAPILLARY: Glucose-Capillary: 114 mg/dL — ABNORMAL HIGH (ref 70–99)

## 2012-11-16 LAB — URINE CULTURE

## 2012-11-16 NOTE — Progress Notes (Signed)
Pt with pause this AM of 2.03 seconds. BP 150/70 and pt asymptomatic. MD paged via amion textpage x1. Shelby Mattocks, RN

## 2012-11-16 NOTE — Discharge Summary (Signed)
Physician Discharge Summary  Destiny [name] [id number] DOB: 08-29-1931 DOA: 11/14/2012  PCP: Penni Homans, MD  Admit date: 11/14/2012 Discharge date: 11/16/2012  Time spent: 35 minutes  Recommendations for Outpatient Follow-up:  1. Follow up in PCP office in 4 weeks.  Discharge Diagnoses:  Principal Problem:   Syncope Active Problems:   Diabetes mellitus type 2, insulin dependent   Discharge Condition: stable  Diet recommendation: carb modified  Filed Weights   11/14/12 2145 11/16/12 0537  Weight: 73.755 kg (162 lb 9.6 oz) 73.6 kg (162 lb 4.1 oz)    History of present illness:  79 y.o. female who had not been feeling well all day, she wanted to go sit outside with family because her house was cold she states. After sitting down to eat she states family noted that she looked "ill" and she states she "felt bad", after this she proceeded to have a syncopal episode with 20 seconds of seizure like activity of there LUE. Patient woke up after this with L sided weakness for a short while after as first responders arrived at the scene. By the time she arrived in the ED her L sided symptoms had completely resolved and she actually is now feeling better than she has all day she states. When asked to further qualify the symptoms of "feeling ill" she had earlier she isnt really able to expand on this: no chest pain, no shortness of breath, no N/V/D.   Hospital Course:  Syncope most likely vaso-vagal: - Known carotid artery disease  Carotid dopplers have continued to show 40-59%. 20 second LOC, no lost of bowel or bladder control, no biting of the tongue, no focal deficits. - Observing patient on tele monitor no events, serial trops negative.  - MRI brain negative for acute CVA. - Neurology consulted: EEG negative on presentation BP some what low and mildly bradycardic. Neurology recommended no futher work up.  DM2:  - continue home Lantus and SSI, CBG checks  AC/HS   Procedures:  Carotid doppler: 40-59% stenosis  MRI   Consultations:  Neurology  Discharge Exam: Filed Vitals:   11/16/12 0628  BP: 150/70  Pulse:   Temp:   Resp:     General: A&O x3 Cardiovascular: RRR Respiratory: good air movement CTA B/L  Discharge Instructions  Discharge Orders   Future Orders Complete By Expires   Diet - low sodium heart healthy  As directed    Increase activity slowly  As directed        Medication List         amLODipine 10 MG tablet  Commonly known as:  NORVASC  Take 1 tablet (10 mg total) by mouth daily.     aspirin 81 MG tablet  Take 1 tablet (81 mg total) by mouth daily.     clopidogrel 75 MG tablet  Commonly known as:  PLAVIX  Take 75 mg by mouth daily.     colesevelam 625 MG tablet  Commonly known as:  WELCHOL  Take 3 tablets (1,875 mg total) by mouth daily.     donepezil 10 MG tablet  Commonly known as:  ARICEPT  Take 1 tablet (10 mg total) by mouth at bedtime as needed.     Fish Oil 1200 MG Caps  Take 1,200 mg by mouth daily.     furosemide 20 MG tablet  Commonly known as:  LASIX  Take 1 tablet (20 mg total) by mouth daily.     Glucosamine Chondr 500 Complex Caps  Take 1 capsule by mouth daily.     HUMALOG KWIKPEN 100 UNIT/ML Sopn  Generic drug:  insulin lispro  Inject 5-10 Units into the skin 3 (three) times daily with meals.     insulin glargine 100 UNIT/ML injection  Commonly known as:  LANTUS  Inject 35 Units into the skin at bedtime.     Iron 325 (65 FE) MG Tabs  Take 325 mg by mouth 2 (two) times daily.     isosorbide mononitrate 60 MG 24 hr tablet  Commonly known as:  IMDUR  Take 1 tablet (60 mg total) by mouth daily.     nitroGLYCERIN 0.4 MG SL tablet  Commonly known as:  NITROSTAT  Place 0.4 mg under the tongue every 5 (five) minutes as needed for chest pain.     pantoprazole 40 MG tablet  Commonly known as:  PROTONIX  Take 1 tablet (40 mg total) by mouth daily.     quinapril 40  MG tablet  Commonly known as:  ACCUPRIL  Take 0.5 tablets (20 mg total) by mouth daily.     rosuvastatin 20 MG tablet  Commonly known as:  CRESTOR  Take 20 mg by mouth at bedtime.     vitamin B-12 1000 MCG tablet  Commonly known as:  CYANOCOBALAMIN  Take 1,000 mcg by mouth daily.     Vitamin D 1000 UNITS capsule  Take 1,000 Units by mouth every evening.       No Known Allergies     Follow-up Information   Follow up with Penni Homans, MD In 4 weeks. (follow up in 2-4 weeks)    Specialty:  Family Medicine   Contact information:   [place] [address] [phone]       Follow up with Tinnie Gens, MD. (carotid doppler in 1 year)    Specialty:  Vascular Surgery   Contact information:   [place] [address] [phone]        The results of significant diagnostics from this hospitalization (including imaging, microbiology, ancillary and laboratory) are listed below for reference.    Significant Diagnostic Studies: Mr Herby Abraham Contrast  11/14/2012   CLINICAL DATA:  79 year old female 's with syncope. Seizure versus TIA. Left face and left upper extremity involvement.  EXAM: MRI HEAD WITHOUT CONTRAST  TECHNIQUE: Multiplanar, multisequence MR imaging was performed. No intravenous contrast was administered.  COMPARISON:  Head CT 05/14/2011. Brain MRI 01/08/2011.  FINDINGS: Stable cerebral volume since 2012. Multifocal chronic infarcts in the brain, including confluent left Corona radiata-internal capsule, and right PCA/parietal lobe involvement with chronic encephalomalacia.  No restricted diffusion or evidence of acute infarction. Major intracranial vascular flow voids are stable including loss of the distal right vertebral artery flow void.  Stable signal in the brain since 2012. No acute intracranial hemorrhage identified. Insert stable vents Stable partially empty sella configuration. Negative cervicomedullary junction and  visualized cervical spine.  Normal bone marrow signal. No acute orbit findings. Visualized paranasal sinuses and mastoids are clear. Negative scalp soft tissues.  IMPRESSION: No acute intracranial abnormality. Stable brain with chronic ischemic disease compared to 01/08/2011.   Electronically Signed   By: Lars Pinks   On: 11/14/2012 19:24    Microbiology: Recent Results (from the past 240 hour(s))  URINE CULTURE     Status: None   Collection Time    11/14/12  6:59 PM      Result Value Range Status   Specimen Description  URINE, CLEAN CATCH   Final   Special Requests NONE   Final   Culture  Setup Time     Final   Value: 11/15/2012 04:13     Performed at [facility]     Final   Value: NO GROWTH     Performed at [facility]   Culture     Final   Value: NO GROWTH     Performed at [facility]   Report Status 11/16/2012 FINAL   Final     Labs: Basic Metabolic Panel:  Recent Labs Lab 11/14/12 1542 11/15/12 0159  NA 138 138  K 3.9 3.7  CL 103 103  CO2 23 23  GLUCOSE 116* 186*  BUN 21 27*  CREATININE 1.46* 1.42*  CALCIUM 9.5 8.9   Liver Function Tests:  Recent Labs Lab 11/14/12 1542  AST 18  ALT 19  ALKPHOS 67  BILITOT 0.8  PROT 7.0  ALBUMIN 3.3*   No results found for this basename: LIPASE, AMYLASE,  in the last 168 hours No results found for this basename: AMMONIA,  in the last 168 hours CBC:  Recent Labs Lab 11/14/12 1542 11/15/12 0159  WBC 7.4 6.6  NEUTROABS 4.7  --   HGB 12.0 11.3*  HCT 33.5* 32.1*  MCV 87.2 87.5  PLT 192 159   Cardiac Enzymes:  Recent Labs Lab 11/14/12 1543 11/15/12 0159 11/15/12 0859 11/15/12 1359  TROPONINI <0.30 <0.30 <0.30 <0.30   BNP: BNP (last 3 results) No results found for this basename: PROBNP,  in the last 8760 hours CBG:  Recent Labs Lab 11/15/12 0606 11/15/12 1114 11/15/12 1618 11/15/12 2052 11/16/12 0551  GLUCAP 154* 225* 210* 133* 114*        Signed:  Charlynne Cousins  [facility] 11/16/2012, 7:41 AM

## 2012-11-29 ENCOUNTER — Ambulatory Visit: Payer: Medicare Other | Admitting: Family Medicine

## 2012-12-06 ENCOUNTER — Ambulatory Visit (INDEPENDENT_AMBULATORY_CARE_PROVIDER_SITE_OTHER): Payer: Medicare Other | Admitting: Family Medicine

## 2012-12-06 ENCOUNTER — Encounter: Payer: Self-pay | Admitting: Family Medicine

## 2012-12-06 VITALS — BP 142/68 | HR 55 | Temp 97.8°F | Ht 66.0 in | Wt 166.1 lb

## 2012-12-06 DIAGNOSIS — Z794 Long term (current) use of insulin: Secondary | ICD-10-CM

## 2012-12-06 DIAGNOSIS — N390 Urinary tract infection, site not specified: Secondary | ICD-10-CM

## 2012-12-06 DIAGNOSIS — Z23 Encounter for immunization: Secondary | ICD-10-CM

## 2012-12-06 DIAGNOSIS — E119 Type 2 diabetes mellitus without complications: Secondary | ICD-10-CM

## 2012-12-06 DIAGNOSIS — I10 Essential (primary) hypertension: Secondary | ICD-10-CM

## 2012-12-06 DIAGNOSIS — M129 Arthropathy, unspecified: Secondary | ICD-10-CM

## 2012-12-06 DIAGNOSIS — E785 Hyperlipidemia, unspecified: Secondary | ICD-10-CM

## 2012-12-06 DIAGNOSIS — M1990 Unspecified osteoarthritis, unspecified site: Secondary | ICD-10-CM

## 2012-12-06 DIAGNOSIS — N259 Disorder resulting from impaired renal tubular function, unspecified: Secondary | ICD-10-CM

## 2012-12-06 DIAGNOSIS — E538 Deficiency of other specified B group vitamins: Secondary | ICD-10-CM

## 2012-12-06 HISTORY — DX: Unspecified osteoarthritis, unspecified site: M19.90

## 2012-12-06 NOTE — Assessment & Plan Note (Signed)
Recent uti required hospital stay, noted some increased hesitancy today, recheck UA and C&S

## 2012-12-06 NOTE — Assessment & Plan Note (Addendum)
Improving control recheck hgba1c prior to next visit no change in meds, given flu shot today

## 2012-12-06 NOTE — Patient Instructions (Addendum)
  Start a probiotic such as Digestive Advantage or a generic Start a cranberry tab such as AZO Increase water intake. Want roughly 64 oz daily, increase fluids if drinking caffeine  Urinary Tract Infection Urinary tract infections (UTIs) can develop anywhere along your urinary tract. Your urinary tract is your body's drainage system for removing wastes and extra water. Your urinary tract includes two kidneys, two ureters, a bladder, and a urethra. Your kidneys are a pair of bean-shaped organs. Each kidney is about the size of your fist. They are located below your ribs, one on each side of your spine. CAUSES Infections are caused by microbes, which are microscopic organisms, including fungi, viruses, and bacteria. These organisms are so small that they can only be seen through a microscope. Bacteria are the microbes that most commonly cause UTIs. SYMPTOMS  Symptoms of UTIs may vary by age and gender of the patient and by the location of the infection. Symptoms in young women typically include a frequent and intense urge to urinate and a painful, burning feeling in the bladder or urethra during urination. Older women and men are more likely to be tired, shaky, and weak and have muscle aches and abdominal pain. A fever may mean the infection is in your kidneys. Other symptoms of a kidney infection include pain in your back or sides below the ribs, nausea, and vomiting. DIAGNOSIS To diagnose a UTI, your caregiver will ask you about your symptoms. Your caregiver also will ask to provide a urine sample. The urine sample will be tested for bacteria and white blood cells. White blood cells are made by your body to help fight infection. TREATMENT  Typically, UTIs can be treated with medication. Because most UTIs are caused by a bacterial infection, they usually can be treated with the use of antibiotics. The choice of antibiotic and length of treatment depend on your symptoms and the type of bacteria causing  your infection. HOME CARE INSTRUCTIONS  If you were prescribed antibiotics, take them exactly as your caregiver instructs you. Finish the medication even if you feel better after you have only taken some of the medication.  Drink enough water and fluids to keep your urine clear or pale yellow.  Avoid caffeine, tea, and carbonated beverages. They tend to irritate your bladder.  Empty your bladder often. Avoid holding urine for long periods of time.  Empty your bladder before and after sexual intercourse.  After a bowel movement, women should cleanse from front to back. Use each tissue only once. SEEK MEDICAL CARE IF:   You have back pain.  You develop a fever.  Your symptoms do not begin to resolve within 3 days. SEEK IMMEDIATE MEDICAL CARE IF:   You have severe back pain or lower abdominal pain.  You develop chills.  You have nausea or vomiting.  You have continued burning or discomfort with urination. MAKE SURE YOU:   Understand these instructions.  Will watch your condition.  Will get help right away if you are not doing well or get worse. Document Released: 12/10/2004 Document Revised: 09/01/2011 Document Reviewed: 04/10/2011 [facility] Patient Information 2014 [place].

## 2012-12-06 NOTE — Assessment & Plan Note (Signed)
Recheck labs prior to next visit

## 2012-12-06 NOTE — Assessment & Plan Note (Addendum)
Improved on re check, no changes

## 2012-12-06 NOTE — Assessment & Plan Note (Signed)
Stiff shoulders qhs, try Salon pas prn

## 2012-12-06 NOTE — Assessment & Plan Note (Signed)
Check levelprior to next visit

## 2012-12-06 NOTE — Progress Notes (Signed)
Patient ID: Destiny [name], female   DOB: [birth year], 79 y.o.   MRN: [id number] [name] [id number] [birth year] 12/06/2012      Progress Note-Follow Up  Subjective  Chief Complaint  Chief Complaint  Patient presents with  . Follow-up    hospital  . Injections    flu    HPI  Patient is a 76 Caucasian female who is here today for followup. She was recently hospitalized with a urinary tract infection without any urinary symptoms. She feels well today although she did noticed a mild increasing urinary hesitancy just this morning. She denies fevers chills. She has no chest pain or palpitations. She denies shortness of breath abdominal or back pain. No malaise or myalgias. She believes her sugars have been well-controlled recently but she technologist she's having with her memory. Take medications as prescribed she believes no acute complaints noted today except for ongoing shoulder pain.  Past Medical History  Diagnosis Date  . PVD (peripheral vascular disease)   . CAD (coronary artery disease)     a. s/p CABG x 4 (LIMA->LAD, VG->Diag, VG->OM1->OM3);  b. 2010 Cath: 3/4 patent grafts (VG->diag occluded), 3vd, sev PVD ->Med Rx;  c. 12/2011 Lexi MV: sm area of mild mid and apical ant wall ischemia ->med rx.  . Hypertension   . Hyperlipidemia   . Duodenal ulcer 2009 and 11/2011  . Iron deficiency anemia secondary to blood loss (chronic)   . Amaurosis fugax   . Carotid bruit   . Diabetes mellitus type II   . Dementia     pt denies  . Diabetic retinopathy   . Gallstones 2009    seen on CT scan  . Diverticulosis   . Atherosclerosis   . Renal insufficiency   . CVA (cerebral infarction)   . Diabetic peripheral neuropathy   . GI bleed 04/2011, 11/2011    Cause not defined.   . Colon polyp     Past Surgical History  Procedure Laterality Date  . Coronary artery bypass graft  1999    x 4  . Abdominal hysterectomy    . Upper gastrointestinal endoscopy  02/22/2008    w/biopsy,  duedenal ulcer, antrum erosions  . Esophagogastroduodenoscopy  05/16/2011    Procedure: ESOPHAGOGASTRODUODENOSCOPY (EGD);  Surgeon: Gatha Mayer, MD;  Location: [facility] ENDOSCOPY;  Service: Endoscopy;  Laterality: N/A;  . Colonoscopy  05/20/2011    Procedure: COLONOSCOPY;  Surgeon: Scarlette Shorts, MD;  Location: [facility];  Service: Endoscopy;  Laterality: N/A;  . Esophagogastroduodenoscopy  12/08/2011    Procedure: ESOPHAGOGASTRODUODENOSCOPY (EGD);  Surgeon: Ladene Artist, MD,FACG;  Location: [facility] ENDOSCOPY;  Service: Endoscopy;  Laterality: N/A;    Family History  Problem Relation Age of Onset  . Coronary artery disease Father   . Hypertension Father   . Stroke Father   . Cancer Mother     ?  . Diabetes Maternal Uncle   . Colon cancer Neg Hx     History   Social History  . Marital Status: Widowed    Spouse Name: N/A    Number of Children: 4  . Years of Education: N/A   Occupational History  . retired    Social History Main Topics  . Smoking status: Former Smoker    Types: Cigarettes    Quit date: 12/31/2000  . Smokeless tobacco: Never Used  . Alcohol Use: No  . Drug Use: No  . Sexual Activity: Not on file   Other Topics Concern  . Not  on file   Social History Narrative   Widowed - husband passed away in 05-19-09   lives with daughter with epilepsy - mental retardation   Retired     Former Smoker      Alcohol use-no          Occupation: Retired       son - Lareisha Fasanella     Current Outpatient Prescriptions on File Prior to Visit  Medication Sig Dispense Refill  . amLODipine (NORVASC) 10 MG tablet Take 1 tablet (10 mg total) by mouth daily.  30 tablet  3  . aspirin 81 MG tablet Take 1 tablet (81 mg total) by mouth daily.  30 tablet    . Cholecalciferol (VITAMIN D) 1000 UNITS capsule Take 1,000 Units by mouth every evening.       . clopidogrel (PLAVIX) 75 MG tablet Take 75 mg by mouth daily.      . colesevelam (WELCHOL) 625 MG tablet Take 3 tablets (1,875 mg total) by  mouth daily.  270 tablet  1  . donepezil (ARICEPT) 10 MG tablet Take 1 tablet (10 mg total) by mouth at bedtime as needed.  30 tablet  5  . Ferrous Sulfate (IRON) 325 (65 FE) MG TABS Take 325 mg by mouth 2 (two) times daily.       . furosemide (LASIX) 20 MG tablet Take 1 tablet (20 mg total) by mouth daily.  30 tablet  2  . Glucosamine-Chondroit-Vit C-Mn (GLUCOSAMINE CHONDR 500 COMPLEX) CAPS Take 1 capsule by mouth daily.       . insulin glargine (LANTUS) 100 UNIT/ML injection Inject 35 Units into the skin at bedtime.      . insulin lispro (HUMALOG KWIKPEN) 100 UNIT/ML SOPN Inject 5-10 Units into the skin 3 (three) times daily with meals.      . isosorbide mononitrate (IMDUR) 60 MG 24 hr tablet Take 1 tablet (60 mg total) by mouth daily.  90 tablet  1  . nitroGLYCERIN (NITROSTAT) 0.4 MG SL tablet Place 0.4 mg under the tongue every 5 (five) minutes as needed for chest pain.      . Omega-3 Fatty Acids (FISH OIL) 1200 MG CAPS Take 1,200 mg by mouth daily.       . pantoprazole (PROTONIX) 40 MG tablet Take 1 tablet (40 mg total) by mouth daily.  90 tablet  1  . quinapril (ACCUPRIL) 40 MG tablet Take 0.5 tablets (20 mg total) by mouth daily.  45 tablet  1  . rosuvastatin (CRESTOR) 20 MG tablet Take 20 mg by mouth at bedtime.       . vitamin B-12 (CYANOCOBALAMIN) 1000 MCG tablet Take 1,000 mcg by mouth daily.       No current facility-administered medications on file prior to visit.    No Known Allergies  Review of Systems  Review of Systems  Constitutional: Negative for fever and malaise/fatigue.  HENT: Negative for congestion.   Eyes: Negative for discharge.  Respiratory: Negative for shortness of breath.   Cardiovascular: Negative for chest pain, palpitations and leg swelling.  Gastrointestinal: Negative for nausea, abdominal pain and diarrhea.  Genitourinary: Positive for frequency. Negative for dysuria, urgency and hematuria.  Musculoskeletal: Negative for falls.  Skin: Negative for  rash.  Neurological: Negative for loss of consciousness and headaches.  Endo/Heme/Allergies: Negative for polydipsia.  Psychiatric/Behavioral: Negative for depression and suicidal ideas. The patient is not nervous/anxious and does not have insomnia.     Objective  BP 142/68  Pulse  55  Temp(Src) 97.8 F (36.6 C) (Oral)  Ht 5\' 6"  (1.676 m)  Wt 166 lb 1.3 oz (75.333 kg)  BMI 26.82 kg/m2  SpO2 96%  Physical Exam  Physical Exam  Constitutional: She is oriented to person, place, and time and well-developed, well-nourished, and in no distress. No distress.  HENT:  Head: Normocephalic and atraumatic.  Eyes: Conjunctivae are normal.  Neck: Neck supple. No thyromegaly present.  Cardiovascular: Normal rate, regular rhythm and normal heart sounds.   No murmur heard. Pulmonary/Chest: Effort normal and breath sounds normal. She has no wheezes.  Abdominal: She exhibits no distension and no mass.  Musculoskeletal: She exhibits no edema.  Lymphadenopathy:    She has no cervical adenopathy.  Neurological: She is alert and oriented to person, place, and time.  Skin: Skin is warm and dry. No rash noted. She is not diaphoretic.  Psychiatric: Memory, affect and judgment normal.    Lab Results  Component Value Date   TSH 1.148 10/27/2012   Lab Results  Component Value Date   WBC 6.6 11/15/2012   HGB 11.3* 11/15/2012   HCT 32.1* 11/15/2012   MCV 87.5 11/15/2012   PLT 159 11/15/2012   Lab Results  Component Value Date   CREATININE 1.42* 11/15/2012   BUN 27* 11/15/2012   NA 138 11/15/2012   K 3.7 11/15/2012   CL 103 11/15/2012   CO2 23 11/15/2012   Lab Results  Component Value Date   ALT 19 11/14/2012   AST 18 11/14/2012   ALKPHOS 67 11/14/2012   BILITOT 0.8 11/14/2012   Lab Results  Component Value Date   CHOL 160 10/27/2012   Lab Results  Component Value Date   HDL 45 10/27/2012   Lab Results  Component Value Date   LDLCALC 74 10/27/2012   Lab Results  Component Value Date   TRIG 204* 10/27/2012    Lab Results  Component Value Date   CHOLHDL 3.6 10/27/2012     Assessment & Plan  HYPERTENSION Improved on re check, no changes  UTI (urinary tract infection) Recent uti required hospital stay, noted some increased hesitancy today, recheck UA and C&S  HYPERLIPIDEMIA Recheck labs prior to next visit  B12 deficiency Check levelprior to next visit  Diabetes mellitus type 2, insulin dependent Improving control recheck hgba1c prior to next visit no change in meds, given flu shot today  RENAL INSUFFICIENCY Recheck with next labs  Arthritis Stiff shoulders qhs, try Salon pas prn

## 2012-12-06 NOTE — Assessment & Plan Note (Signed)
Recheck with next labs.   

## 2012-12-07 LAB — URINE CULTURE: Organism ID, Bacteria: NO GROWTH

## 2012-12-07 LAB — URINALYSIS
Glucose, UA: NEGATIVE mg/dL
Hgb urine dipstick: NEGATIVE
Ketones, ur: NEGATIVE mg/dL
Nitrite: NEGATIVE
Specific Gravity, Urine: 1.01 (ref 1.005–1.030)
pH: 5 (ref 5.0–8.0)

## 2012-12-08 NOTE — Progress Notes (Signed)
Quick Note:  Patient Informed and voiced understanding.  Pt would like to wait on the referral ______

## 2012-12-20 ENCOUNTER — Other Ambulatory Visit: Payer: Self-pay | Admitting: Family

## 2012-12-21 ENCOUNTER — Other Ambulatory Visit: Payer: Self-pay | Admitting: Family

## 2013-01-10 ENCOUNTER — Ambulatory Visit (HOSPITAL_COMMUNITY): Payer: Medicare Other | Attending: Family Medicine

## 2013-01-10 ENCOUNTER — Other Ambulatory Visit: Payer: Self-pay | Admitting: Internal Medicine

## 2013-01-10 DIAGNOSIS — I10 Essential (primary) hypertension: Secondary | ICD-10-CM | POA: Insufficient documentation

## 2013-01-10 DIAGNOSIS — R0989 Other specified symptoms and signs involving the circulatory and respiratory systems: Secondary | ICD-10-CM | POA: Insufficient documentation

## 2013-01-10 DIAGNOSIS — Z87891 Personal history of nicotine dependence: Secondary | ICD-10-CM | POA: Insufficient documentation

## 2013-01-10 DIAGNOSIS — Z951 Presence of aortocoronary bypass graft: Secondary | ICD-10-CM | POA: Insufficient documentation

## 2013-01-10 DIAGNOSIS — I2510 Atherosclerotic heart disease of native coronary artery without angina pectoris: Secondary | ICD-10-CM | POA: Insufficient documentation

## 2013-01-10 DIAGNOSIS — Z8673 Personal history of transient ischemic attack (TIA), and cerebral infarction without residual deficits: Secondary | ICD-10-CM | POA: Insufficient documentation

## 2013-01-10 DIAGNOSIS — I6529 Occlusion and stenosis of unspecified carotid artery: Secondary | ICD-10-CM

## 2013-01-10 DIAGNOSIS — I658 Occlusion and stenosis of other precerebral arteries: Secondary | ICD-10-CM | POA: Insufficient documentation

## 2013-01-10 DIAGNOSIS — E785 Hyperlipidemia, unspecified: Secondary | ICD-10-CM | POA: Insufficient documentation

## 2013-01-10 DIAGNOSIS — I739 Peripheral vascular disease, unspecified: Secondary | ICD-10-CM | POA: Insufficient documentation

## 2013-01-17 ENCOUNTER — Ambulatory Visit: Payer: Medicare Other | Admitting: Family Medicine

## 2013-01-18 ENCOUNTER — Telehealth: Payer: Self-pay | Admitting: *Deleted

## 2013-01-18 NOTE — Telephone Encounter (Signed)
Received message from Foster Simpson, RN with [facility] requesting pt's most recent BP and her BP goal. States she is pt's health coach and will also be faxing [place] a request.  Please advise re: goal. Awaiting fax.

## 2013-01-18 NOTE — Telephone Encounter (Signed)
Called number given for response, line just kept ringing, no answer/SLS

## 2013-01-18 NOTE — Telephone Encounter (Signed)
Most recent BP in clinic was 142/68 on 9/23 visit with Dr. [name].  Her BP goal, given her other conditions should be in the 120-30/80 range.

## 2013-01-19 ENCOUNTER — Ambulatory Visit (HOSPITAL_BASED_OUTPATIENT_CLINIC_OR_DEPARTMENT_OTHER)
Admission: RE | Admit: 2013-01-19 | Discharge: 2013-01-19 | Disposition: A | Discharge status: Home / Self Care | Payer: Medicare Other | Source: Ambulatory Visit | Attending: Family Medicine | Admitting: Family Medicine

## 2013-01-19 ENCOUNTER — Encounter: Payer: Self-pay | Admitting: Family Medicine

## 2013-01-19 ENCOUNTER — Ambulatory Visit (INDEPENDENT_AMBULATORY_CARE_PROVIDER_SITE_OTHER): Payer: Medicare Other | Admitting: Family Medicine

## 2013-01-19 VITALS — BP 148/68 | HR 63 | Temp 98.3°F | Ht 66.0 in | Wt 166.1 lb

## 2013-01-19 DIAGNOSIS — Z951 Presence of aortocoronary bypass graft: Secondary | ICD-10-CM | POA: Insufficient documentation

## 2013-01-19 DIAGNOSIS — R0989 Other specified symptoms and signs involving the circulatory and respiratory systems: Secondary | ICD-10-CM | POA: Insufficient documentation

## 2013-01-19 DIAGNOSIS — E119 Type 2 diabetes mellitus without complications: Secondary | ICD-10-CM | POA: Insufficient documentation

## 2013-01-19 DIAGNOSIS — R252 Cramp and spasm: Secondary | ICD-10-CM

## 2013-01-19 DIAGNOSIS — N259 Disorder resulting from impaired renal tubular function, unspecified: Secondary | ICD-10-CM

## 2013-01-19 DIAGNOSIS — R0602 Shortness of breath: Secondary | ICD-10-CM

## 2013-01-19 DIAGNOSIS — R350 Frequency of micturition: Secondary | ICD-10-CM

## 2013-01-19 DIAGNOSIS — N390 Urinary tract infection, site not specified: Secondary | ICD-10-CM

## 2013-01-19 DIAGNOSIS — R0609 Other forms of dyspnea: Secondary | ICD-10-CM | POA: Insufficient documentation

## 2013-01-19 DIAGNOSIS — I10 Essential (primary) hypertension: Secondary | ICD-10-CM

## 2013-01-19 DIAGNOSIS — I2510 Atherosclerotic heart disease of native coronary artery without angina pectoris: Secondary | ICD-10-CM | POA: Insufficient documentation

## 2013-01-19 DIAGNOSIS — Z794 Long term (current) use of insulin: Secondary | ICD-10-CM

## 2013-01-19 LAB — RENAL FUNCTION PANEL
Calcium: 9.8 mg/dL (ref 8.4–10.5)
Chloride: 107 mEq/L (ref 96–112)
Phosphorus: 2.8 mg/dL (ref 2.3–4.6)
Potassium: 4.5 mEq/L (ref 3.5–5.3)
Sodium: 141 mEq/L (ref 135–145)

## 2013-01-19 LAB — CBC
MCHC: 35.3 g/dL (ref 30.0–36.0)
Platelets: 210 10*3/uL (ref 150–400)
RDW: 13.9 % (ref 11.5–15.5)
WBC: 7.8 10*3/uL (ref 4.0–10.5)

## 2013-01-19 LAB — MAGNESIUM: Magnesium: 1.8 mg/dL (ref 1.5–2.5)

## 2013-01-19 NOTE — Patient Instructions (Addendum)
Calcium/magnesium/zinc tab 1 evening Probiotic daily Digestive Advantage  Urinary Tract Infection Urinary tract infections (UTIs) can develop anywhere along your urinary tract. Your urinary tract is your body's drainage system for removing wastes and extra water. Your urinary tract includes two kidneys, two ureters, a bladder, and a urethra. Your kidneys are a pair of bean-shaped organs. Each kidney is about the size of your fist. They are located below your ribs, one on each side of your spine. CAUSES Infections are caused by microbes, which are microscopic organisms, including fungi, viruses, and bacteria. These organisms are so small that they can only be seen through a microscope. Bacteria are the microbes that most commonly cause UTIs. SYMPTOMS  Symptoms of UTIs may vary by age and gender of the patient and by the location of the infection. Symptoms in young women typically include a frequent and intense urge to urinate and a painful, burning feeling in the bladder or urethra during urination. Older women and men are more likely to be tired, shaky, and weak and have muscle aches and abdominal pain. A fever may mean the infection is in your kidneys. Other symptoms of a kidney infection include pain in your back or sides below the ribs, nausea, and vomiting. DIAGNOSIS To diagnose a UTI, your caregiver will ask you about your symptoms. Your caregiver also will ask to provide a urine sample. The urine sample will be tested for bacteria and white blood cells. White blood cells are made by your body to help fight infection. TREATMENT  Typically, UTIs can be treated with medication. Because most UTIs are caused by a bacterial infection, they usually can be treated with the use of antibiotics. The choice of antibiotic and length of treatment depend on your symptoms and the type of bacteria causing your infection. HOME CARE INSTRUCTIONS  If you were prescribed antibiotics, take them exactly as your  caregiver instructs you. Finish the medication even if you feel better after you have only taken some of the medication.  Drink enough water and fluids to keep your urine clear or pale yellow.  Avoid caffeine, tea, and carbonated beverages. They tend to irritate your bladder.  Empty your bladder often. Avoid holding urine for long periods of time.  Empty your bladder before and after sexual intercourse.  After a bowel movement, women should cleanse from front to back. Use each tissue only once. SEEK MEDICAL CARE IF:   You have back pain.  You develop a fever.  Your symptoms do not begin to resolve within 3 days. SEEK IMMEDIATE MEDICAL CARE IF:   You have severe back pain or lower abdominal pain.  You develop chills.  You have nausea or vomiting.  You have continued burning or discomfort with urination. MAKE SURE YOU:   Understand these instructions.  Will watch your condition.  Will get help right away if you are not doing well or get worse. Document Released: 12/10/2004 Document Revised: 09/01/2011 Document Reviewed: 04/10/2011 [facility] Patient Information 2014 [place].

## 2013-01-20 LAB — URINALYSIS
Glucose, UA: NEGATIVE mg/dL
Nitrite: NEGATIVE
Protein, ur: 100 mg/dL — AB

## 2013-01-20 MED ORDER — CEFDINIR 300 MG PO CAPS: 300.0000 mg | ORAL_CAPSULE | Freq: Two times a day (BID) | ORAL | Status: DC

## 2013-01-20 NOTE — Progress Notes (Signed)
Quick Note:  Patient Informed and voiced understanding.  RX sent to pharmacy ______

## 2013-01-20 NOTE — Progress Notes (Signed)
Quick Note:  Patient Informed and voiced understanding ______ 

## 2013-01-22 NOTE — Assessment & Plan Note (Signed)
Mild, will continue to monitor. 

## 2013-01-22 NOTE — Assessment & Plan Note (Signed)
Well controlled, no changes, minimize simple carbs

## 2013-01-22 NOTE — Assessment & Plan Note (Signed)
Mild elevation with acute illness. No today

## 2013-01-22 NOTE — Progress Notes (Signed)
Patient ID: Destiny [name], female   DOB: 1931/08/25, 79 y.o.   MRN: [id number] ZAKIYYAH LIVERGOOD [id number] 08/16/1931 01/22/2013      Progress Note-Follow Up  Subjective  Chief Complaint  Chief Complaint  Patient presents with  . Follow-up  . legs cramping    X 1 night  . Shortness of Breath    X last night  . Constipation    X 3 days    HPI  Patient is a 79 year old female in today with her son. She had a rough night last night. She did not sleep well, muscle cramps and urinary frequency. Is also complaining of constipation, has not moved her bowels in 3 days. No fevers, chills, n/v.   Past Medical History  Diagnosis Date  . PVD (peripheral vascular disease)   . CAD (coronary artery disease)     a. s/p CABG x 4 (LIMA->LAD, VG->Diag, VG->OM1->OM3);  b. 2010 Cath: 3/4 patent grafts (VG->diag occluded), 3vd, sev PVD ->Med Rx;  c. 12/2011 Lexi MV: sm area of mild mid and apical ant wall ischemia ->med rx.  . Hypertension   . Hyperlipidemia   . Duodenal ulcer 2009 and 11/2011  . Iron deficiency anemia secondary to blood loss (chronic)   . Amaurosis fugax   . Carotid bruit   . Diabetes mellitus type II   . Dementia     pt denies  . Diabetic retinopathy   . Gallstones 2009    seen on CT scan  . Diverticulosis   . Atherosclerosis   . Renal insufficiency   . CVA (cerebral infarction)   . Diabetic peripheral neuropathy   . GI bleed 06/06/2011, 11/2011    Cause not defined.   . Colon polyp   . Arthritis 12/06/2012    Past Surgical History  Procedure Laterality Date  . Coronary artery bypass graft  1999    x 4  . Abdominal hysterectomy    . Upper gastrointestinal endoscopy  02/22/2008    w/biopsy, duedenal ulcer, antrum erosions  . Esophagogastroduodenoscopy  05/16/2011    Procedure: ESOPHAGOGASTRODUODENOSCOPY (EGD);  Surgeon: Gatha Mayer, MD;  Location: [facility] ENDOSCOPY;  Service: Endoscopy;  Laterality: N/A;  . Colonoscopy  05/20/2011    Procedure: COLONOSCOPY;  Surgeon: Scarlette Shorts, MD;  Location: [place];  Service: Endoscopy;  Laterality: N/A;  . Esophagogastroduodenoscopy  12/08/2011    Procedure: ESOPHAGOGASTRODUODENOSCOPY (EGD);  Surgeon: Ladene Artist, MD,FACG;  Location: [facility] ENDOSCOPY;  Service: Endoscopy;  Laterality: N/A;    Family History  Problem Relation Age of Onset  . Coronary artery disease Father   . Hypertension Father   . Stroke Father   . Cancer Mother     ?  . Diabetes Maternal Uncle   . Colon cancer Neg Hx     History   Social History  . Marital Status: Widowed    Spouse Name: N/A    Number of Children: 4  . Years of Education: N/A   Occupational History  . retired    Social History Main Topics  . Smoking status: Former Smoker    Types: Cigarettes    Quit date: 12/31/2000  . Smokeless tobacco: Never Used  . Alcohol Use: No  . Drug Use: No  . Sexual Activity: Not on file   Other Topics Concern  . Not on file   Social History Narrative   Widowed - husband passed away in 06-05-09   lives with daughter with epilepsy - mental retardation  Retired     Former Smoker      Alcohol use-no          Occupation: Retired       son - Burnadette Shima     Current Outpatient Prescriptions on File Prior to Visit  Medication Sig Dispense Refill  . amLODipine (NORVASC) 10 MG tablet Take 1 tablet (10 mg total) by mouth daily.  30 tablet  3  . aspirin 81 MG tablet Take 1 tablet (81 mg total) by mouth daily.  30 tablet    . Cholecalciferol (VITAMIN D) 1000 UNITS capsule Take 1,000 Units by mouth every evening.       . clopidogrel (PLAVIX) 75 MG tablet Take 75 mg by mouth daily.      Marland Kitchen donepezil (ARICEPT) 10 MG tablet Take 1 tablet (10 mg total) by mouth at bedtime as needed.  30 tablet  5  . Ferrous Sulfate (IRON) 325 (65 FE) MG TABS Take 325 mg by mouth 2 (two) times daily.       . furosemide (LASIX) 20 MG tablet Take 1 tablet (20 mg total) by mouth daily.  30 tablet  2  . Glucosamine-Chondroit-Vit C-Mn (GLUCOSAMINE CHONDR 500  COMPLEX) CAPS Take 1 capsule by mouth daily.       . insulin glargine (LANTUS) 100 UNIT/ML injection Inject 35 Units into the skin at bedtime.      . insulin lispro (HUMALOG KWIKPEN) 100 UNIT/ML SOPN Inject 5-10 Units into the skin 3 (three) times daily with meals.      . isosorbide mononitrate (IMDUR) 60 MG 24 hr tablet Take 1 tablet (60 mg total) by mouth daily.  90 tablet  1  . nitroGLYCERIN (NITROSTAT) 0.4 MG SL tablet Place 0.4 mg under the tongue every 5 (five) minutes as needed for chest pain.      Marland Kitchen NITROSTAT 0.4 MG SL tablet PLACE 1 TABLET (0.4 MG TOTAL) UNDER THE TONGUE EVERY 5 (FIVE) MINUTES AS NEEDED.  25 tablet  0  . Omega-3 Fatty Acids (FISH OIL) 1200 MG CAPS Take 1,200 mg by mouth daily.       . pantoprazole (PROTONIX) 40 MG tablet Take 1 tablet (40 mg total) by mouth daily.  90 tablet  1  . quinapril (ACCUPRIL) 40 MG tablet Take 0.5 tablets (20 mg total) by mouth daily.  45 tablet  1  . rosuvastatin (CRESTOR) 20 MG tablet Take 20 mg by mouth at bedtime.       . vitamin B-12 (CYANOCOBALAMIN) 1000 MCG tablet Take 1,000 mcg by mouth daily.      . WELCHOL 625 MG tablet TAKE 1 OR 2 TABLETS BY MOUTH TWICE A DAY WITH FOOD  360 tablet  1   No current facility-administered medications on file prior to visit.    No Known Allergies  Review of Systems  Review of Systems  Constitutional: Negative for fever and malaise/fatigue.  HENT: Negative for congestion.   Eyes: Negative for discharge.  Respiratory: Positive for shortness of breath.   Cardiovascular: Positive for chest pain. Negative for palpitations and leg swelling.  Gastrointestinal: Positive for constipation. Negative for nausea, abdominal pain and diarrhea.  Genitourinary: Positive for urgency and frequency. Negative for dysuria and hematuria.  Musculoskeletal: Positive for myalgias. Negative for falls.  Skin: Negative for rash.  Neurological: Negative for loss of consciousness and headaches.  Endo/Heme/Allergies: Negative  for polydipsia.  Psychiatric/Behavioral: Negative for depression and suicidal ideas. The patient is not nervous/anxious and does not have insomnia.  Objective  BP 148/68  Pulse 63  Temp(Src) 98.3 F (36.8 C) (Oral)  Ht 5\' 6"  (1.676 m)  Wt 166 lb 1.3 oz (75.333 kg)  BMI 26.82 kg/m2  SpO2 97%  Physical Exam  Physical Exam  Constitutional: She is oriented to person, place, and time and well-developed, well-nourished, and in no distress. No distress.  HENT:  Head: Normocephalic and atraumatic.  Eyes: Conjunctivae are normal.  Neck: Neck supple. No thyromegaly present.  Cardiovascular: Normal rate, regular rhythm and normal heart sounds.   No murmur heard. Pulmonary/Chest: Effort normal and breath sounds normal. She has no wheezes.  Abdominal: She exhibits no distension and no mass.  Musculoskeletal: She exhibits no edema.  Lymphadenopathy:    She has no cervical adenopathy.  Neurological: She is alert and oriented to person, place, and time.  Skin: Skin is warm and dry. No rash noted. She is not diaphoretic.  Psychiatric: Memory, affect and judgment normal.    Lab Results  Component Value Date   TSH 1.148 10/27/2012   Lab Results  Component Value Date   WBC 7.8 01/19/2013   HGB 12.6 01/19/2013   HCT 35.7* 01/19/2013   MCV 87.1 01/19/2013   PLT 210 01/19/2013   Lab Results  Component Value Date   CREATININE 1.30* 01/19/2013   BUN 25* 01/19/2013   NA 141 01/19/2013   K 4.5 01/19/2013   CL 107 01/19/2013   CO2 27 01/19/2013   Lab Results  Component Value Date   ALT 19 11/14/2012   AST 18 11/14/2012   ALKPHOS 67 11/14/2012   BILITOT 0.8 11/14/2012   Lab Results  Component Value Date   CHOL 160 10/27/2012   Lab Results  Component Value Date   HDL 45 10/27/2012   Lab Results  Component Value Date   LDLCALC 74 10/27/2012   Lab Results  Component Value Date   TRIG 204* 10/27/2012   Lab Results  Component Value Date   CHOLHDL 3.6 10/27/2012     Assessment &  Plan  HYPERTENSION Mild elevation with acute illness. No today  Diabetes mellitus type 2, insulin dependent Well controlled, no changes, minimize simple carbs  RENAL INSUFFICIENCY Mild, will continue to monitor  UTI (urinary tract infection) Started on Cefdinir and probiotics.

## 2013-01-22 NOTE — Assessment & Plan Note (Signed)
Started on Cefdinir and probiotics.

## 2013-01-24 ENCOUNTER — Ambulatory Visit (INDEPENDENT_AMBULATORY_CARE_PROVIDER_SITE_OTHER): Payer: Medicare Other | Admitting: Nurse Practitioner

## 2013-01-24 ENCOUNTER — Encounter: Payer: Self-pay | Admitting: Nurse Practitioner

## 2013-01-24 ENCOUNTER — Telehealth: Payer: Self-pay | Admitting: Family Medicine

## 2013-01-24 VITALS — BP 122/56 | HR 56 | Resp 14 | Wt 169.4 lb

## 2013-01-24 DIAGNOSIS — R0601 Orthopnea: Secondary | ICD-10-CM

## 2013-01-24 NOTE — Patient Instructions (Addendum)
I think you may have sleep apnea causing you to feel short of breath at night. I have referred you to pulmonology for sleep study. Pleasure to meet you.

## 2013-01-24 NOTE — Progress Notes (Signed)
Subjective:    Destiny [name] is a 79 y.o. female who presents for evaluation of shortness of breath at night, wakes from sleep. Breathes better when in chair. Although she denies CP during episodes, she hs been taking NTG tabs with no relief. She does not feel SOB during day. Dyspnea has been moderate, and generally lasting few minutes, goes away once awake & sittong up. Episodes usually occur during sleep and have been gradually worsening. Associated symptoms include: occasional bilat arm pain, but pt states this occurs often without SOB.. The symptoms are aggravated by lying flat, and relieved by sitting up.    The following portions of the patient's history were reviewed and updated as appropriate: allergies, current medications, past family history, past medical history, past social history and problem list.  Review of Systems Constitutional: negative for weight gain Respiratory: positive for orthopnea, negative for cough, sputum and wheezing Cardiovascular: negative for chest pain, chest pressure/discomfort and irregular heart beat Gastrointestinal: negative for increasing abdominal girth/tight clothes Neurological: positive for dementia, negative for headaches Behavioral/Psych: positive for sleep disturbance    Objective:    Physical Exam BP 122/56  Pulse 56  Resp 14  Wt 169 lb 6.4 oz (76.839 kg)  SpO2 97% General appearance: alert, cooperative, appears stated age, no distress and accompanied by her son Head: Normocephalic, without obvious abnormality, atraumatic Ears: normal TM's and external ear canals both ears Lungs: clear to auscultation bilaterally Heart: bradycardia, no murmur, regular rhythm Abdomen: pot-belly shape, soft Extremities: edema mild bilat LE edema Pulses: 2+ and symmetric   Imaging Chest x-ray: reviewed films from 1 week ago, when pt had same complaint. No fluid, essentially nml findings.    Assessment:   Orthopnea, likely r/t sleep apnea. No weight  gain, lungs clear  Plan    Ref pulm for sleep study.

## 2013-01-24 NOTE — Telephone Encounter (Signed)
Patient Information:  Caller Name: Ameliyah  Phone: [phone]  Patient: Destiny [name]  Gender: Female  DOB: June 22, 1931  Age: 79 Years  PCP: Penni [facility])  Office Follow Up:  Does the office need to follow up with this patient?: No  Instructions For The Office: N/A  RN Note:  Pt woke during the night not able to catch her breath.  Pt denies Chest Pain, Dizziness. Pt continues to have SOB at triage, able to speak sentences. Appt scheduled at [facility] at 1530.   Symptoms  Reason For Call & Symptoms: ER CALL.  SOB.  Reviewed Health History In EMR: N/A  Reviewed Medications In EMR: N/A  Reviewed Allergies In EMR: N/A  Reviewed Surgeries / Procedures: N/A  Date of Onset of Symptoms: 01/23/2013  Guideline(s) Used:  Breathing Difficulty  Disposition Per Guideline:   Go to Office Now  Reason For Disposition Reached:   Mild difficulty breathing (e.g., minimal/no SOB at rest, SOB with walking, pulse < 100) of new onset or worse than normal  Advice Given:  N/A  Patient Will Follow Care Advice:  YES

## 2013-01-27 ENCOUNTER — Ambulatory Visit (INDEPENDENT_AMBULATORY_CARE_PROVIDER_SITE_OTHER): Payer: Medicare Other

## 2013-01-27 VITALS — BP 155/68 | HR 60 | Resp 20 | Ht 66.5 in | Wt 170.0 lb

## 2013-01-27 DIAGNOSIS — B351 Tinea unguium: Secondary | ICD-10-CM

## 2013-01-27 DIAGNOSIS — E1140 Type 2 diabetes mellitus with diabetic neuropathy, unspecified: Secondary | ICD-10-CM

## 2013-01-27 DIAGNOSIS — E1149 Type 2 diabetes mellitus with other diabetic neurological complication: Secondary | ICD-10-CM

## 2013-01-27 DIAGNOSIS — M79609 Pain in unspecified limb: Secondary | ICD-10-CM

## 2013-01-27 DIAGNOSIS — M2040 Other hammer toe(s) (acquired), unspecified foot: Secondary | ICD-10-CM

## 2013-01-27 DIAGNOSIS — M2010 Hallux valgus (acquired), unspecified foot: Secondary | ICD-10-CM

## 2013-01-27 NOTE — Patient Instructions (Signed)
Onychomycosis/Fungal Toenails  WHAT IS IT? An infection that lies within the keratin of your nail plate that is caused by a fungus.  WHY ME? Fungal infections affect all ages, sexes, races, and creeds.  There may be many factors that predispose you to a fungal infection such as age, coexisting medical conditions such as diabetes, or an autoimmune disease; stress, medications, fatigue, genetics, etc.  Bottom line: fungus thrives in a warm, moist environment and your shoes offer such a location.  IS IT CONTAGIOUS? Theoretically, yes.  You do not want to share shoes, nail clippers or files with someone who has fungal toenails.  Walking around barefoot in the same room or sleeping in the same bed is unlikely to transfer the organism.  It is important to realize, however, that fungus can spread easily from one nail to the next on the same foot.  HOW DO WE TREAT THIS?  There are several ways to treat this condition.  Treatment may depend on many factors such as age, medications, pregnancy, liver and kidney conditions, etc.  It is best to ask your doctor which options are available to you.  1. No treatment.   Unlike many other medical concerns, you can live with this condition.  However for many people this can be a painful condition and may lead to ingrown toenails or a bacterial infection.  It is recommended that you keep the nails cut short to help reduce the amount of fungal nail. 2. Topical treatment.  These range from herbal remedies to prescription strength nail lacquers.  About 40-50% effective, topicals require twice daily application for approximately 9 to 12 months or until an entirely new nail has grown out.  The most effective topicals are medical grade medications available through physicians offices. 3. Oral antifungal medications.  With an 80-90% cure rate, the most common oral medication requires 3 to 4 months of therapy and stays in your system for a year as the new nail grows out.  Oral  antifungal medications do require blood work to make sure it is a safe drug for you.  A liver function panel will be performed prior to starting the medication and after the first month of treatment.  It is important to have the blood work performed to avoid any harmful side effects.  In general, this medication safe but blood work is required. 4. Laser Therapy.  This treatment is performed by applying a specialized laser to the affected nail plate.  This therapy is noninvasive, fast, and non-painful.  It is not covered by insurance and is therefore, out of pocket.  The results have been very good with a 80-95% cure rate.  [facility] is the only practice in the area to offer this therapy. Permanent Nail Avulsion.  Removing the entire nail so that a new nail will not grow back  Diabetes and Foot Care Diabetes may cause you to have problems because of poor blood supply (circulation) to your feet and legs. This may cause the skin on your feet to become thinner, break easier, and heal more slowly. Your skin may become dry, and the skin may peel and crack. You may also have nerve damage in your legs and feet causing decreased feeling in them. You may not notice minor injuries to your feet that could lead to infections or more serious problems. Taking care of your feet is one of the most important things you can do for yourself.  HOME CARE INSTRUCTIONS  Wear shoes at  all times, even in the house. Do not go barefoot. Bare feet are easily injured.  Check your feet daily for blisters, cuts, and redness. If you cannot see the bottom of your feet, use a mirror or ask someone for help.  Wash your feet with warm water (do not use hot water) and mild soap. Then pat your feet and the areas between your toes until they are completely dry. Do not soak your feet as this can dry your skin.  Apply a moisturizing lotion or petroleum jelly (that does not contain alcohol and is unscented) to the skin on your feet  and to dry, brittle toenails. Do not apply lotion between your toes.  Trim your toenails straight across. Do not dig under them or around the cuticle. File the edges of your nails with an emery board or nail file.  Do not cut corns or calluses or try to remove them with medicine.  Wear clean socks or stockings every day. Make sure they are not too tight. Do not wear knee-high stockings since they may decrease blood flow to your legs.  Wear shoes that fit properly and have enough cushioning. To break in new shoes, wear them for just a few hours a day. This prevents you from injuring your feet. Always look in your shoes before you put them on to be sure there are no objects inside.  Do not cross your legs. This may decrease the blood flow to your feet.  If you find a minor scrape, cut, or break in the skin on your feet, keep it and the skin around it clean and dry. These areas may be cleansed with mild soap and water. Do not cleanse the area with peroxide, alcohol, or iodine.  When you remove an adhesive bandage, be sure not to damage the skin around it.  If you have a wound, look at it several times a day to make sure it is healing.  Do not use heating pads or hot water bottles. They may burn your skin. If you have lost feeling in your feet or legs, you may not know it is happening until it is too late.  Make sure your health care provider performs a complete foot exam at least annually or more often if you have foot problems. Report any cuts, sores, or bruises to your health care provider immediately. SEEK MEDICAL CARE IF:   You have an injury that is not healing.  You have cuts or breaks in the skin.  You have an ingrown nail.  You notice redness on your legs or feet.  You feel burning or tingling in your legs or feet.  You have pain or cramps in your legs and feet.  Your legs or feet are numb.  Your feet always feel cold. SEEK IMMEDIATE MEDICAL CARE IF:   There is increasing  redness, swelling, or pain in or around a wound.  There is a red line that goes up your leg.  Pus is coming from a wound.  You develop a fever or as directed by your health care provider.  You notice a bad smell coming from an ulcer or wound. Document Released: 02/28/2000 Document Revised: 11/02/2012 Document Reviewed: 08/09/2012 [facility] Patient Information 2014 [place].   Marland Kitchen

## 2013-01-27 NOTE — Progress Notes (Signed)
  Subjective:    Patient ID: Destiny [name], female    DOB: 05-23-1931, 79 y.o.   MRN: [id number] "I'm here to talk about getting new Diabetic shoes."    Diabetes She presents for her follow-up diabetic visit. She has type 2 diabetes mellitus. The initial diagnosis of diabetes was made 15 years ago. Her disease course has been stable. (Sore 5th digit rt.) Symptoms are improving. Symptoms have been present for 2 days. Current diabetic treatment includes insulin injections. Her weight is fluctuating minimally. She is following a low fat/cholesterol diet. She never participates in exercise. Her home blood glucose trend is fluctuating minimally. She sees a podiatrist.     Review of Systems  Constitutional: Positive for appetite change.  HENT: Negative.   Eyes: Negative.   Respiratory: Positive for cough and wheezing.   Cardiovascular: Positive for leg swelling.  Gastrointestinal: Negative.   Endocrine: Negative.   Genitourinary: Negative.   Musculoskeletal: Negative.   Skin: Negative.   Allergic/Immunologic: Negative.   Neurological: Negative.   Hematological: Bruises/bleeds easily.  Psychiatric/Behavioral: Positive for sleep disturbance and decreased concentration.       Objective:   Physical Exam Patient does this time for diabetic shoes however his glasses for nails were trimmed less than 28 nail salon them trimmed and she has nail polish and placed her patient does have thick friable discolored is consistent with onychomycosis as well as her diabetes. At this time vascular status is intact although diminished DP +2/4 bilateral PT nonpalpable bilateral mild varicosities noted skin temperature warm to cool turgor diminished no edema noted no rubor noted neurologically epicritic and proprioceptive sensations diminished on Semmes Weinstein to the forefoot and digits bilateral consistent with diabetic neuropathy. Orthopedic biomechanical exam reveals with notable HAV deformity bilateral as  well as flexible digital contractures 2 through 5. There is associated keratoses pinch callus first bilateral and HD 5 secondary to hammertoe deformities. Patient Merrilyn [name] for new diabetic shoes however at this time we'll have furniture new Dr. forgot the name. Will forward request for authorization for diabetic shoes with receive for new physician's name.       Assessment & Plan:  Assessment this time diabetes with peripheral neuropathy bunion hammertoe deformities as well as onychomycosis of nails. Mycotic nails debrided 1 through 5 bilateral the presence of pain symptomatology as well as diabetes. Patient will also follow up with measuring fitting for diabetic shoes was authorizations have been obtained. At this time nails debrided x10 advised to avoid using nail polish in the future. Patient currently wearing diabetic shoes which are worn and need replacing.  Harriet Masson DP

## 2013-01-31 ENCOUNTER — Institutional Professional Consult (permissible substitution): Payer: Medicare Other | Admitting: Pulmonary Disease

## 2013-02-06 ENCOUNTER — Encounter: Payer: Self-pay | Admitting: Pediatrics

## 2013-02-08 ENCOUNTER — Other Ambulatory Visit: Payer: Self-pay | Admitting: Physician Assistant

## 2013-02-13 ENCOUNTER — Ambulatory Visit: Payer: Medicare Other | Admitting: Family Medicine

## 2013-03-07 ENCOUNTER — Other Ambulatory Visit: Payer: Self-pay | Admitting: Physician Assistant

## 2013-03-07 ENCOUNTER — Encounter: Payer: Self-pay | Admitting: Family Medicine

## 2013-03-07 ENCOUNTER — Ambulatory Visit (INDEPENDENT_AMBULATORY_CARE_PROVIDER_SITE_OTHER): Payer: Medicare Other | Admitting: Family Medicine

## 2013-03-07 VITALS — BP 160/70 | HR 54 | Temp 98.2°F | Ht 66.0 in | Wt 165.0 lb

## 2013-03-07 DIAGNOSIS — R0609 Other forms of dyspnea: Secondary | ICD-10-CM

## 2013-03-07 DIAGNOSIS — R0600 Dyspnea, unspecified: Secondary | ICD-10-CM

## 2013-03-07 DIAGNOSIS — I10 Essential (primary) hypertension: Secondary | ICD-10-CM

## 2013-03-07 DIAGNOSIS — Z794 Long term (current) use of insulin: Secondary | ICD-10-CM

## 2013-03-07 DIAGNOSIS — E119 Type 2 diabetes mellitus without complications: Secondary | ICD-10-CM

## 2013-03-07 DIAGNOSIS — G4710 Hypersomnia, unspecified: Secondary | ICD-10-CM

## 2013-03-07 LAB — RENAL FUNCTION PANEL
Albumin: 4.1 g/dL (ref 3.5–5.2)
CO2: 24 mEq/L (ref 19–32)
Calcium: 9.4 mg/dL (ref 8.4–10.5)
Creat: 1.44 mg/dL — ABNORMAL HIGH (ref 0.50–1.10)
Phosphorus: 3.2 mg/dL (ref 2.3–4.6)
Sodium: 141 mEq/L (ref 135–145)

## 2013-03-07 LAB — HEMOGLOBIN A1C
Hgb A1c MFr Bld: 7.5 % — ABNORMAL HIGH (ref <5.7)
Mean Plasma Glucose: 169 mg/dL — ABNORMAL HIGH (ref <117)

## 2013-03-07 MED ORDER — TRIAMTERENE-HCTZ 37.5-25 MG PO TABS: 1.0000 | ORAL_TABLET | Freq: Every day | ORAL | Status: DC

## 2013-03-07 MED ORDER — FUROSEMIDE 20 MG PO TABS: 20.0000 mg | ORAL_TABLET | Freq: Every day | ORAL | Status: DC | PRN

## 2013-03-07 NOTE — Patient Instructions (Signed)
Put feet above heart 15 minutes twice daily as needed for edema  Edema Edema is an abnormal build-up of fluids in tissues. Because this is partly dependent on gravity (water flows to the lowest place), it is more common in the legs and thighs (lower extremities). It is also common in the looser tissues, like around the eyes. Painless swelling of the feet and ankles is common and increases as a person ages. It may affect both legs and may include the calves or even thighs. When squeezed, the fluid may move out of the affected area and may leave a dent for a few moments. CAUSES   Prolonged standing or sitting in one place for extended periods of time. Movement helps pump tissue fluid into the veins, and absence of movement prevents this, resulting in edema.  Varicose veins. The valves in the veins do not work as well as they should. This causes fluid to leak into the tissues.  Fluid and salt overload.  Injury, burn, or surgery to the leg, ankle, or foot, may damage veins and allow fluid to leak out.  Sunburn damages vessels. Leaky vessels allow fluid to go out into the sunburned tissues.  Allergies (from insect bites or stings, medications or chemicals) cause swelling by allowing vessels to become leaky.  Protein in the blood helps keep fluid in your vessels. Low protein, as in malnutrition, allows fluid to leak out.  Hormonal changes, including pregnancy and menstruation, cause fluid retention. This fluid may leak out of vessels and cause edema.  Medications that cause fluid retention. Examples are sex hormones, blood pressure medications, steroid treatment, or anti-depressants.  Some illnesses cause edema, especially heart failure, kidney disease, or liver disease.  Surgery that cuts veins or lymph nodes, such as surgery done for the heart or for breast cancer, may result in edema. DIAGNOSIS  Your caregiver is usually easily able to determine what is causing your swelling (edema) by  simply asking what is wrong (getting a history) and examining you (doing a physical). Sometimes x-rays, EKG (electrocardiogram or heart tracing), and blood work may be done to evaluate for underlying medical illness. TREATMENT  General treatment includes:  Leg elevation (or elevation of the affected body part).  Restriction of fluid intake.  Prevention of fluid overload.  Compression of the affected body part. Compression with elastic bandages or support stockings squeezes the tissues, preventing fluid from entering and forcing it back into the blood vessels.  Diuretics (also called water pills or fluid pills) pull fluid out of your body in the form of increased urination. These are effective in reducing the swelling, but can have side effects and must be used only under your caregiver's supervision. Diuretics are appropriate only for some types of edema. The specific treatment can be directed at any underlying causes discovered. Heart, liver, or kidney disease should be treated appropriately. HOME CARE INSTRUCTIONS   Elevate the legs (or affected body part) above the level of the heart, while lying down.  Avoid sitting or standing still for prolonged periods of time.  Avoid putting anything directly under the knees when lying down, and do not wear constricting clothing or garters on the upper legs.  Exercising the legs causes the fluid to work back into the veins and lymphatic channels. This may help the swelling go down.  The pressure applied by elastic bandages or support stockings can help reduce ankle swelling.  A low-salt diet may help reduce fluid retention and decrease the ankle swelling.  Take  any medications exactly as prescribed. SEEK MEDICAL CARE IF:  Your edema is not responding to recommended treatments. SEEK IMMEDIATE MEDICAL CARE IF:   You develop shortness of breath or chest pain.  You cannot breathe when you lay down; or if, while lying down, you have to get up  and go to the window to get your breath.  You are having increasing swelling without relief from treatment.  You develop a fever over 102 F (38.9 C).  You develop pain or redness in the areas that are swollen.  Tell your caregiver right away if you have gained 03 lb/1.4 kg in 1 day or 05 lb/2.3 kg in a week. MAKE SURE YOU:   Understand these instructions.  Will watch your condition.  Will get help right away if you are not doing well or get worse. Document Released: 03/02/2005 Document Revised: 09/01/2011 Document Reviewed: 10/19/2007 [facility] Patient Information 2014 [place].

## 2013-03-07 NOTE — Progress Notes (Signed)
Pre visit review using our clinic review tool, if applicable. No additional management support is needed unless otherwise documented below in the visit note. 

## 2013-03-12 ENCOUNTER — Encounter: Payer: Self-pay | Admitting: Family Medicine

## 2013-03-12 DIAGNOSIS — G4710 Hypersomnia, unspecified: Secondary | ICD-10-CM

## 2013-03-12 HISTORY — DX: Hypersomnia, unspecified: G47.10

## 2013-03-12 NOTE — Assessment & Plan Note (Signed)
hgba1c 7.5,minimize simple carbs and increase Levemir by 2 units

## 2013-03-12 NOTE — Assessment & Plan Note (Signed)
Add Maxzide avoid sodium

## 2013-03-12 NOTE — Assessment & Plan Note (Signed)
Has appt with pulmonology in January to investigate possibility of sleep apnea with associated dyspnea will proceed with echo as well

## 2013-03-12 NOTE — Progress Notes (Signed)
Patient ID: Destiny [name], female   DOB: 1931/08/15, 79 y.o.   MRN: [id number] Destiny [name] [id number] 1931-04-23 03/12/2013      Progress Note-Follow Up  Subjective  Chief Complaint  Chief Complaint  Patient presents with  . Follow-up    3 week    HPI  Patient is a 79 year old female who is in today for followup. She continues to struggle with hypersomnolence but denies headache or chest pain. No recent illness. No congestion, palpitations or GI concerns. Does note some mild dyspnea when she tries to lie flat and with exertion. No edema. No recent change in medications. Blood sugars have been staying generally under 200 and she denies polyuria and polydipsia  Past Medical History  Diagnosis Date  . PVD (peripheral vascular disease)   . CAD (coronary artery disease)     a. s/p CABG x 4 (LIMA->LAD, VG->Diag, VG->OM1->OM3);  b. 2010 Cath: 3/4 patent grafts (VG->diag occluded), 3vd, sev PVD ->Med Rx;  c. 12/2011 Lexi MV: sm area of mild mid and apical ant wall ischemia ->med rx.  . Hypertension   . Hyperlipidemia   . Duodenal ulcer 2009 and 11/2011  . Iron deficiency anemia secondary to blood loss (chronic)   . Amaurosis fugax   . Carotid bruit   . Diabetes mellitus type II   . Dementia     pt denies  . Diabetic retinopathy   . Gallstones 2009    seen on CT scan  . Diverticulosis   . Atherosclerosis   . Renal insufficiency   . CVA (cerebral infarction)   . Diabetic peripheral neuropathy   . GI bleed 05-15-2011, 11/2011    Cause not defined.   . Colon polyp   . Arthritis 12/06/2012  . Hypersomnolence 03/12/2013    Past Surgical History  Procedure Laterality Date  . Coronary artery bypass graft  1999    x 4  . Abdominal hysterectomy    . Upper gastrointestinal endoscopy  02/22/2008    w/biopsy, duedenal ulcer, antrum erosions  . Esophagogastroduodenoscopy  05/16/2011    Procedure: ESOPHAGOGASTRODUODENOSCOPY (EGD);  Surgeon: Gatha Mayer, MD;  Location: [facility] ENDOSCOPY;   Service: Endoscopy;  Laterality: N/A;  . Colonoscopy  05/20/2011    Procedure: COLONOSCOPY;  Surgeon: Scarlette Shorts, MD;  Location: [place];  Service: Endoscopy;  Laterality: N/A;  . Esophagogastroduodenoscopy  12/08/2011    Procedure: ESOPHAGOGASTRODUODENOSCOPY (EGD);  Surgeon: Ladene Artist, MD,FACG;  Location: [facility] ENDOSCOPY;  Service: Endoscopy;  Laterality: N/A;    Family History  Problem Relation Age of Onset  . Coronary artery disease Father   . Hypertension Father   . Stroke Father   . Cancer Mother     ?  . Diabetes Maternal Uncle   . Colon cancer Neg Hx     History   Social History  . Marital Status: Widowed    Spouse Name: N/A    Number of Children: 4  . Years of Education: N/A   Occupational History  . retired    Social History Main Topics  . Smoking status: Former Smoker    Types: Cigarettes    Quit date: 12/31/2000  . Smokeless tobacco: Never Used  . Alcohol Use: No  . Drug Use: No  . Sexual Activity: Not on file   Other Topics Concern  . Not on file   Social History Narrative   Widowed - husband passed away in 2009-05-14   lives with daughter with epilepsy - mental retardation  Retired     Former Smoker      Alcohol use-no          Occupation: Retired       son - Tiaraoluwa Cremeens     Current Outpatient Prescriptions on File Prior to Visit  Medication Sig Dispense Refill  . aspirin 81 MG tablet Take 1 tablet (81 mg total) by mouth daily.  30 tablet    . Cholecalciferol (VITAMIN D) 1000 UNITS capsule Take 1,000 Units by mouth every evening.       . clopidogrel (PLAVIX) 75 MG tablet Take 75 mg by mouth daily.      Marland Kitchen donepezil (ARICEPT) 10 MG tablet Take 1 tablet (10 mg total) by mouth at bedtime as needed.  30 tablet  5  . Ferrous Sulfate (IRON) 325 (65 FE) MG TABS Take 325 mg by mouth 2 (two) times daily.       . Glucosamine-Chondroit-Vit C-Mn (GLUCOSAMINE CHONDR 500 COMPLEX) CAPS Take 1 capsule by mouth daily.       . insulin glargine (LANTUS) 100  UNIT/ML injection Inject 35 Units into the skin at bedtime.      . insulin lispro (HUMALOG KWIKPEN) 100 UNIT/ML SOPN Inject 5-10 Units into the skin 3 (three) times daily with meals.      . isosorbide mononitrate (IMDUR) 60 MG 24 hr tablet Take 1 tablet (60 mg total) by mouth daily.  90 tablet  1  . nitroGLYCERIN (NITROSTAT) 0.4 MG SL tablet Place 0.4 mg under the tongue every 5 (five) minutes as needed for chest pain.      Marland Kitchen NITROSTAT 0.4 MG SL tablet PLACE 1 TABLET (0.4 MG TOTAL) UNDER THE TONGUE EVERY 5 (FIVE) MINUTES AS NEEDED.  25 tablet  0  . Omega-3 Fatty Acids (FISH OIL) 1200 MG CAPS Take 1,200 mg by mouth daily.       . pantoprazole (PROTONIX) 40 MG tablet Take 1 tablet (40 mg total) by mouth daily.  90 tablet  1  . quinapril (ACCUPRIL) 40 MG tablet Take 0.5 tablets (20 mg total) by mouth daily.  45 tablet  1  . rosuvastatin (CRESTOR) 20 MG tablet Take 20 mg by mouth at bedtime.       . vitamin B-12 (CYANOCOBALAMIN) 1000 MCG tablet Take 1,000 mcg by mouth daily.      . WELCHOL 625 MG tablet TAKE 1 OR 2 TABLETS BY MOUTH TWICE A DAY WITH FOOD  360 tablet  1   No current facility-administered medications on file prior to visit.    No Known Allergies  Review of Systems  Review of Systems  Constitutional: Positive for malaise/fatigue. Negative for fever.  HENT: Negative for congestion.   Eyes: Negative for discharge.  Respiratory: Negative for shortness of breath.   Cardiovascular: Negative for chest pain, palpitations and leg swelling.  Gastrointestinal: Negative for nausea, abdominal pain and diarrhea.  Genitourinary: Negative for dysuria.  Musculoskeletal: Negative for falls.  Skin: Negative for rash.  Neurological: Negative for loss of consciousness and headaches.  Endo/Heme/Allergies: Negative for polydipsia.  Psychiatric/Behavioral: Negative for depression and suicidal ideas. The patient is not nervous/anxious and does not have insomnia.     Objective  BP 160/70  Pulse  54  Temp(Src) 98.2 F (36.8 C) (Oral)  Ht 5\' 6"  (1.676 m)  Wt 165 lb (74.844 kg)  BMI 26.64 kg/m2  SpO2 97%  Physical Exam  Physical Exam  Constitutional: She is oriented to person, place, and time and well-developed, well-nourished, and  in no distress. No distress.  HENT:  Head: Normocephalic and atraumatic.  Eyes: Conjunctivae are normal.  Neck: Neck supple. No thyromegaly present.  Cardiovascular: Normal rate, regular rhythm and normal heart sounds.   No murmur heard. Pulmonary/Chest: Effort normal and breath sounds normal. She has no wheezes.  Abdominal: She exhibits no distension and no mass.  Musculoskeletal: She exhibits no edema.  Lymphadenopathy:    She has no cervical adenopathy.  Neurological: She is alert and oriented to person, place, and time.  Skin: Skin is warm and dry. No rash noted. She is not diaphoretic.  Psychiatric: Memory, affect and judgment normal.    Lab Results  Component Value Date   TSH 1.148 10/27/2012   Lab Results  Component Value Date   WBC 7.8 01/19/2013   HGB 12.6 01/19/2013   HCT 35.7* 01/19/2013   MCV 87.1 01/19/2013   PLT 210 01/19/2013   Lab Results  Component Value Date   CREATININE 1.44* 03/07/2013   BUN 30* 03/07/2013   NA 141 03/07/2013   K 4.8 03/07/2013   CL 108 03/07/2013   CO2 24 03/07/2013   Lab Results  Component Value Date   ALT 19 11/14/2012   AST 18 11/14/2012   ALKPHOS 67 11/14/2012   BILITOT 0.8 11/14/2012   Lab Results  Component Value Date   CHOL 160 10/27/2012   Lab Results  Component Value Date   HDL 45 10/27/2012   Lab Results  Component Value Date   LDLCALC 74 10/27/2012   Lab Results  Component Value Date   TRIG 204* 10/27/2012   Lab Results  Component Value Date   CHOLHDL 3.6 10/27/2012     Assessment & Plan  HYPERTENSION Add Maxzide avoid sodium  Diabetes mellitus type 2, insulin dependent hgba1c 7.5,minimize simple carbs and increase Levemir by 2 units  Hypersomnolence Has appt with  pulmonology in January to investigate possibility of sleep apnea with associated dyspnea will proceed with echo as well

## 2013-03-15 ENCOUNTER — Ambulatory Visit (HOSPITAL_BASED_OUTPATIENT_CLINIC_OR_DEPARTMENT_OTHER)
Admission: RE | Admit: 2013-03-15 | Discharge: 2013-03-15 | Disposition: A | Discharge status: Home / Self Care | Payer: Medicare Other | Source: Ambulatory Visit | Attending: Family Medicine | Admitting: Family Medicine

## 2013-03-15 DIAGNOSIS — R0600 Dyspnea, unspecified: Secondary | ICD-10-CM

## 2013-03-15 DIAGNOSIS — R0609 Other forms of dyspnea: Secondary | ICD-10-CM | POA: Insufficient documentation

## 2013-03-15 DIAGNOSIS — I517 Cardiomegaly: Secondary | ICD-10-CM

## 2013-03-15 DIAGNOSIS — R0989 Other specified symptoms and signs involving the circulatory and respiratory systems: Secondary | ICD-10-CM | POA: Insufficient documentation

## 2013-03-15 DIAGNOSIS — Z794 Long term (current) use of insulin: Secondary | ICD-10-CM | POA: Insufficient documentation

## 2013-03-15 DIAGNOSIS — E119 Type 2 diabetes mellitus without complications: Secondary | ICD-10-CM

## 2013-03-15 DIAGNOSIS — R079 Chest pain, unspecified: Secondary | ICD-10-CM

## 2013-03-15 NOTE — Progress Notes (Signed)
  Echocardiogram 2D Echocardiogram has been performed.  Philipp Deputy 03/15/2013, 10:10 AM

## 2013-03-20 ENCOUNTER — Other Ambulatory Visit: Payer: Self-pay | Admitting: Physician Assistant

## 2013-03-27 ENCOUNTER — Encounter: Payer: Self-pay | Admitting: Pulmonary Disease

## 2013-03-27 ENCOUNTER — Ambulatory Visit (INDEPENDENT_AMBULATORY_CARE_PROVIDER_SITE_OTHER): Payer: Medicare Other | Admitting: Pulmonary Disease

## 2013-03-27 VITALS — BP 108/62 | HR 60 | Temp 97.3°F | Ht 66.5 in | Wt 161.0 lb

## 2013-03-27 DIAGNOSIS — F0390 Unspecified dementia without behavioral disturbance: Secondary | ICD-10-CM

## 2013-03-27 DIAGNOSIS — G4733 Obstructive sleep apnea (adult) (pediatric): Secondary | ICD-10-CM

## 2013-03-27 NOTE — Patient Instructions (Signed)
Home sleep study to see if you have obstructive sleep apnea - whether you stop breathing in your sleep

## 2013-03-27 NOTE — Assessment & Plan Note (Signed)
Reasonable to look at OSA as a cause of memory loss The pathophysiology of obstructive sleep apnea , it's cardiovascular consequences & modes of treatment including CPAP were discused with the patient in detail & they evidenced understanding. Proceed with home sleep study

## 2013-03-27 NOTE — Progress Notes (Signed)
Subjective:    Patient ID: Destiny [name], female    DOB: Apr 10, 1931, 80 y.o.   MRN: [id number]  HPI  80 year old ex-smoker presents for an evaluation of obstructive sleep apnea.  She reports excessive daytime somnolence . Epworth sleepiness score is 10/24. She takes a nap for a few minutes every afternoon and is very sleepy as a passenger in a car or sitting inactive in a public place.  She has been experiencing some memory loss recently. She lost her way on the way to the office today. She lives with and takes care of of her disabled daughter.  MRI brain 9/ 2014 showed Stable cerebral volume since 2012. Multifocal chronic infarcts in  the brain, including confluent left Corona radiata-internal capsule,  and right PCA/parietal lobe involvement with chronic  Encephalomalacia.   Bedtime is around 11:30 PM, sleep latency is variable, she sleeps on her side with one pillow, has 2-3 nocturnal awakenings for bathroom visits and is out of bed by 7 AM feeling tired without dryness of mouth or headaches. She's gained about 10 pounds in the last 2 years. There is no history suggestive of cataplexy, sleep paralysis or parasomnias  No bed partner history is available but she denies snoring or witnessed apneas   Past Medical History  Diagnosis Date  . PVD (peripheral vascular disease)   . CAD (coronary artery disease)     a. s/p CABG x 4 (LIMA->LAD, VG->Diag, VG->OM1->OM3);  b. 2010 Cath: 3/4 patent grafts (VG->diag occluded), 3vd, sev PVD ->Med Rx;  c. 12/2011 Lexi MV: sm area of mild mid and apical ant wall ischemia ->med rx.  . Hypertension   . Hyperlipidemia   . Duodenal ulcer 2009 and 11/2011  . Iron deficiency anemia secondary to blood loss (chronic)   . Amaurosis fugax   . Carotid bruit   . Diabetes mellitus type II   . Dementia     pt denies  . Diabetic retinopathy   . Gallstones 2009    seen on CT scan  . Diverticulosis   . Atherosclerosis   . Renal insufficiency   . CVA  (cerebral infarction)   . Diabetic peripheral neuropathy   . GI bleed 04/2011, 11/2011    Cause not defined.   . Colon polyp   . Arthritis 12/06/2012  . Hypersomnolence 03/12/2013    Past Surgical History  Procedure Laterality Date  . Coronary artery bypass graft  1999    x 4  . Abdominal hysterectomy    . Upper gastrointestinal endoscopy  02/22/2008    w/biopsy, duedenal ulcer, antrum erosions  . Esophagogastroduodenoscopy  05/16/2011    Procedure: ESOPHAGOGASTRODUODENOSCOPY (EGD);  Surgeon: Gatha Mayer, MD;  Location: [facility] ENDOSCOPY;  Service: Endoscopy;  Laterality: N/A;  . Colonoscopy  05/20/2011    Procedure: COLONOSCOPY;  Surgeon: Scarlette Shorts, MD;  Location: [place];  Service: Endoscopy;  Laterality: N/A;  . Esophagogastroduodenoscopy  12/08/2011    Procedure: ESOPHAGOGASTRODUODENOSCOPY (EGD);  Surgeon: Ladene Artist, MD,FACG;  Location: [facility] ENDOSCOPY;  Service: Endoscopy;  Laterality: N/A;   No Known Allergies  History   Social History  . Marital Status: Widowed    Spouse Name: N/A    Number of Children: 4  . Years of Education: N/A   Occupational History  . retired    Social History Main Topics  . Smoking status: Former Smoker    Types: Cigarettes    Quit date: 12/31/2000  . Smokeless tobacco: Never Used  . Alcohol Use: No  .  Drug Use: No  . Sexual Activity: Not on file   Other Topics Concern  . Not on file   Social History Narrative   Widowed - husband passed away in May 18, 2009   lives with daughter with epilepsy - mental retardation   Retired     Former Smoker      Alcohol use-no          Occupation: Retired       son - Destiny [name]     Family History  Problem Relation Age of Onset  . Coronary artery disease Father   . Hypertension Father   . Stroke Father   . Cancer Mother     ?  . Diabetes Maternal Uncle   . Colon cancer Neg Hx       Review of Systems  Constitutional: Negative for fever, chills, diaphoresis, activity change, appetite  change, fatigue and unexpected weight change.  HENT: Negative for congestion, dental problem, ear discharge, ear pain, facial swelling, hearing loss, mouth sores, nosebleeds, postnasal drip, rhinorrhea, sinus pressure, sneezing, sore throat, tinnitus, trouble swallowing and voice change.   Eyes: Negative for photophobia, discharge, itching and visual disturbance.  Respiratory: Positive for shortness of breath. Negative for apnea, cough, choking, chest tightness, wheezing and stridor.   Cardiovascular: Negative for chest pain, palpitations and leg swelling.  Gastrointestinal: Negative for nausea, vomiting, abdominal pain, constipation, blood in stool and abdominal distention.  Genitourinary: Negative for dysuria, urgency, frequency, hematuria, flank pain, decreased urine volume and difficulty urinating.  Musculoskeletal: Negative for arthralgias, back pain, gait problem, joint swelling, myalgias, neck pain and neck stiffness.  Skin: Negative for color change, pallor and rash.  Neurological: Negative for dizziness, tremors, seizures, syncope, speech difficulty, weakness, light-headedness, numbness and headaches.  Hematological: Negative for adenopathy. Does not bruise/bleed easily.  Psychiatric/Behavioral: Positive for sleep disturbance and dysphoric mood. Negative for confusion and agitation. The patient is not nervous/anxious.        Objective:   Physical Exam  Gen. Pleasant, well-nourished, in no distress, normal affect ENT - no lesions, no post nasal drip Neck: No JVD, no thyromegaly, no carotid bruits Lungs: no use of accessory muscles, no dullness to percussion, clear without rales or rhonchi  Cardiovascular: Rhythm regular, heart sounds  normal, no murmurs or gallops, no peripheral edema Abdomen: soft and non-tender, no hepatosplenomegaly, BS normal. Musculoskeletal: No deformities, no cyanosis or clubbing Neuro:  alert, non focal       Assessment & Plan:

## 2013-03-31 ENCOUNTER — Telehealth: Payer: Self-pay | Admitting: Family Medicine

## 2013-03-31 MED ORDER — ISOSORBIDE MONONITRATE ER 60 MG PO TB24: 60.0000 mg | ORAL_TABLET | Freq: Every day | ORAL | Status: DC

## 2013-03-31 MED ORDER — NITROGLYCERIN 0.4 MG SL SUBL: SUBLINGUAL_TABLET | SUBLINGUAL | Status: DC

## 2013-03-31 NOTE — Telephone Encounter (Signed)
Refill- isosorbide mn er 60mg  tablet. Take one tablet every day. Qty 90 last fill 10.22.14  Refill- nitrostat 0.4mg  tablet sl. Place one tablet under the tongue every 5 minutes as needed. Qty 25 last fill 10.8.14

## 2013-04-04 ENCOUNTER — Ambulatory Visit: Payer: Medicare Other | Admitting: Family Medicine

## 2013-04-05 ENCOUNTER — Encounter: Payer: Self-pay | Admitting: Family Medicine

## 2013-04-05 ENCOUNTER — Ambulatory Visit (INDEPENDENT_AMBULATORY_CARE_PROVIDER_SITE_OTHER): Payer: Medicare Other | Admitting: Family Medicine

## 2013-04-05 VITALS — BP 140/70 | HR 56 | Temp 97.7°F | Ht 66.0 in | Wt 162.0 lb

## 2013-04-05 DIAGNOSIS — I10 Essential (primary) hypertension: Secondary | ICD-10-CM

## 2013-04-05 DIAGNOSIS — I2510 Atherosclerotic heart disease of native coronary artery without angina pectoris: Secondary | ICD-10-CM

## 2013-04-05 DIAGNOSIS — M79609 Pain in unspecified limb: Secondary | ICD-10-CM

## 2013-04-05 DIAGNOSIS — M79671 Pain in right foot: Secondary | ICD-10-CM

## 2013-04-05 NOTE — Patient Instructions (Addendum)
Gel shoe inserts, try Dr Felicie Morn Try corn pads to spot on base of right foot Do not go barefoot Inspect feet daily  Bring all meds to next visit  Needs to have labs prior to next visit, lipid, renal, cbc, tsh, hgba1c, hepatic in 2 monthsBasic Carbohydrate Counting Basic carbohydrate counting is a way to plan meals. It is done by counting the amount of carbohydrate in foods. Foods that have carbohydrates are starches (grains, beans, starchy vegetables) and sweets. Eating carbohydrates increases blood glucose (sugar) levels. People with diabetes use carbohydrate counting to help keep their blood glucose at a normal level.  COUNTING CARBOHYDRATES IN FOODS The first step in counting carbohydrates is to learn how many carbohydrate servings you should have in every meal. A dietitian can plan this for you. After learning the amount of carbohydrates to include in your meal plan, you can start to choose the carbohydrate-containing foods you want to eat.  There are 2 ways to identify the amount of carbohydrates in the foods you eat.  Read the Nutrition Facts panel on food labels. You need 2 pieces of information from the Nutrition Facts panel to count carbohydrates this way:  Serving size.  Total carbohydrate (in grams). Decide how many servings you will be eating. If it is 1 serving, you will be eating the amount of carbohydrate listed on the panel. If you will be eating 2 servings, you will be eating double the amount of carbohydrate listed on the panel.   Learn serving sizes. A serving size of most carbohydrate-containing foods is about 15 grams (g). Listed below are single serving sizes of common carbohydrate-containing foods:  1 slice bread.   cup unsweetened, dry cereal.   cup hot cereal.   cup rice.   cup mashed potatoes.   cup pasta.  1 cup fresh fruit.   cup canned fruit.  1 cup milk (whole, 2%, or skim).   cup starchy vegetables (peas, corn, or potatoes). Counting  carbohydrates this way is similar to looking on the Nutrition Facts panel. Decide how many servings you will eat first. Multiply the number of servings you eat by 15 g. For example, if you have 2 cups of strawberries, you had 2 servings. That means you had 30 g of carbohydrate (2 servings x 15 g = 30 g). CALCULATING CARBOHYDRATES IN A MEAL Sample dinner  3 oz chicken breast.   cup brown rice.   cup corn.  1 cup fat-free milk.  1 cup strawberries with sugar-free whipped topping. Carbohydrate calculation First, identify the foods that contain carbohydrate:  Rice.  Corn.  Milk.  Strawberries. Calculate the number of servings eaten:  2 servings rice.  1 serving corn.  1 serving milk.  1 serving strawberries. Multiply the number of servings by 15 g:  2 servings rice x 15 g = 30 g.  1 serving corn x 15 g = 15 g.  1 serving milk x 15 g = 15 g.  1 serving strawberries x 15 g = 15 g. Add the amounts to find the total carbohydrates eaten: 30 g + 15 g + 15 g + 15 g = 75 g carbohydrate eaten at dinner. Document Released: 03/02/2005 Document Revised: 05/25/2011 Document Reviewed: 01/16/2011 [facility] Patient Information 2014 [place], [place].

## 2013-04-05 NOTE — Progress Notes (Signed)
Pre visit review using our clinic review tool, if applicable. No additional management support is needed unless otherwise documented below in the visit note. 

## 2013-04-10 ENCOUNTER — Encounter: Payer: Self-pay | Admitting: Family Medicine

## 2013-04-10 DIAGNOSIS — M79671 Pain in right foot: Secondary | ICD-10-CM | POA: Insufficient documentation

## 2013-04-10 NOTE — Assessment & Plan Note (Signed)
Callous noted at base of right 2nd toe no erythema or skin breakdown. Has been set up with podiatry will follow up there if no improvement. May use corn pads to decrease stress on lesion

## 2013-04-10 NOTE — Progress Notes (Signed)
Patient ID: Destiny [name], female   DOB: [birth year], 80 y.o.   MRN: [id number] Destiny [name] [id number] 1931-05-21 04/10/2013      Progress Note-Follow Up  Subjective  Chief Complaint  Chief Complaint  Patient presents with  . Follow-up    4 week    HPI  Patient is a 80 year old Caucasian female who is in today for followup. She has pain noted at the base of her right foot but no other major complaints. No recent illness. She denies fevers, chills, malaise or myalgias. She denies chest pain, palpitations or shortness of breath. No GI or GU concerns. Taking medications as prescribed.  Past Medical History  Diagnosis Date  . PVD (peripheral vascular disease)   . CAD (coronary artery disease)     a. s/p CABG x 4 (LIMA->LAD, VG->Diag, VG->OM1->OM3);  b. 2010 Cath: 3/4 patent grafts (VG->diag occluded), 3vd, sev PVD ->Med Rx;  c. 12/2011 Lexi MV: sm area of mild mid and apical ant wall ischemia ->med rx.  . Hypertension   . Hyperlipidemia   . Duodenal ulcer 2009 and 11/2011  . Iron deficiency anemia secondary to blood loss (chronic)   . Amaurosis fugax   . Carotid bruit   . Diabetes mellitus type II   . Dementia     pt denies  . Diabetic retinopathy   . Gallstones 2009    seen on CT scan  . Diverticulosis   . Atherosclerosis   . Renal insufficiency   . CVA (cerebral infarction)   . Diabetic peripheral neuropathy   . GI bleed 05/27/2011, 11/2011    Cause not defined.   . Colon polyp   . Arthritis 12/06/2012  . Hypersomnolence 03/12/2013  . Right foot pain 04/10/2013    Past Surgical History  Procedure Laterality Date  . Coronary artery bypass graft  1999    x 4  . Abdominal hysterectomy    . Upper gastrointestinal endoscopy  02/22/2008    w/biopsy, duedenal ulcer, antrum erosions  . Esophagogastroduodenoscopy  05/16/2011    Procedure: ESOPHAGOGASTRODUODENOSCOPY (EGD);  Surgeon: Gatha Mayer, MD;  Location: [facility] ENDOSCOPY;  Service: Endoscopy;  Laterality: N/A;  . Colonoscopy   05/20/2011    Procedure: COLONOSCOPY;  Surgeon: Scarlette Shorts, MD;  Location: [place];  Service: Endoscopy;  Laterality: N/A;  . Esophagogastroduodenoscopy  12/08/2011    Procedure: ESOPHAGOGASTRODUODENOSCOPY (EGD);  Surgeon: Ladene Artist, MD,FACG;  Location: [facility] ENDOSCOPY;  Service: Endoscopy;  Laterality: N/A;    Family History  Problem Relation Age of Onset  . Coronary artery disease Father   . Hypertension Father   . Stroke Father   . Cancer Mother     ?  . Diabetes Maternal Uncle   . Colon cancer Neg Hx     History   Social History  . Marital Status: Widowed    Spouse Name: N/A    Number of Children: 4  . Years of Education: N/A   Occupational History  . retired    Social History Main Topics  . Smoking status: Former Smoker    Types: Cigarettes    Quit date: 12/31/2000  . Smokeless tobacco: Never Used  . Alcohol Use: No  . Drug Use: No  . Sexual Activity: Not on file   Other Topics Concern  . Not on file   Social History Narrative   Widowed - husband passed away in 2009/05/26   lives with daughter with epilepsy - mental retardation   Retired  Former Smoker      Alcohol use-no          Occupation: Retired       son - Arma Schmucker     Current Outpatient Prescriptions on File Prior to Visit  Medication Sig Dispense Refill  . amLODipine (NORVASC) 10 MG tablet TAKE 1 TABLET (10 MG TOTAL) BY MOUTH DAILY.  30 tablet  3  . aspirin 81 MG tablet Take 1 tablet (81 mg total) by mouth daily.  30 tablet    . Cholecalciferol (VITAMIN D) 1000 UNITS capsule Take 1,000 Units by mouth every evening.       . clopidogrel (PLAVIX) 75 MG tablet Take 75 mg by mouth daily.      . CRESTOR 20 MG tablet TAKE 1 TABLET BY MOUTH AT BEDTIME  90 tablet  0  . donepezil (ARICEPT) 10 MG tablet Take 1 tablet (10 mg total) by mouth at bedtime as needed.  30 tablet  5  . Ferrous Sulfate (IRON) 325 (65 FE) MG TABS Take 325 mg by mouth 2 (two) times daily.       . furosemide (LASIX) 20 MG  tablet Take 1 tablet (20 mg total) by mouth daily as needed for fluid or edema (sob).  30 tablet  2  . Glucosamine-Chondroit-Vit C-Mn (GLUCOSAMINE CHONDR 500 COMPLEX) CAPS Take 1 capsule by mouth daily.       . insulin glargine (LANTUS) 100 UNIT/ML injection Inject 35 Units into the skin at bedtime.      . insulin lispro (HUMALOG KWIKPEN) 100 UNIT/ML SOPN Inject 5-10 Units into the skin 3 (three) times daily with meals.      . isosorbide mononitrate (IMDUR) 60 MG 24 hr tablet Take 1 tablet (60 mg total) by mouth daily.  90 tablet  1  . nitroGLYCERIN (NITROSTAT) 0.4 MG SL tablet Place 1 tablet under the tongue every 5 min prn  25 tablet  0  . Omega-3 Fatty Acids (FISH OIL) 1200 MG CAPS Take 1,200 mg by mouth daily.       . pantoprazole (PROTONIX) 40 MG tablet Take 1 tablet (40 mg total) by mouth daily.  90 tablet  1  . quinapril (ACCUPRIL) 40 MG tablet Take 0.5 tablets (20 mg total) by mouth daily.  45 tablet  1  . triamterene-hydrochlorothiazide (MAXZIDE-25) 37.5-25 MG per tablet Take 1 tablet by mouth daily.  30 tablet  3  . vitamin B-12 (CYANOCOBALAMIN) 1000 MCG tablet Take 1,000 mcg by mouth daily.      . WELCHOL 625 MG tablet TAKE 1 OR 2 TABLETS BY MOUTH TWICE A DAY WITH FOOD  360 tablet  1   No current facility-administered medications on file prior to visit.    No Known Allergies  Review of Systems  Review of Systems  Constitutional: Negative for fever and malaise/fatigue.  HENT: Negative for congestion.   Eyes: Negative for discharge.  Respiratory: Negative for shortness of breath.   Cardiovascular: Negative for chest pain, palpitations and leg swelling.  Gastrointestinal: Negative for nausea, abdominal pain and diarrhea.  Genitourinary: Negative for dysuria.  Musculoskeletal: Positive for joint pain. Negative for falls.       Right foot pain, base of 2nd toe  Skin: Negative for rash.  Neurological: Negative for loss of consciousness and headaches.  Endo/Heme/Allergies:  Negative for polydipsia.  Psychiatric/Behavioral: Negative for depression and suicidal ideas. The patient is not nervous/anxious and does not have insomnia.     Objective  BP 140/70  Pulse  56  Temp(Src) 97.7 F (36.5 C) (Oral)  Ht 5\' 6"  (1.676 m)  Wt 162 lb 0.6 oz (73.501 kg)  BMI 26.17 kg/m2  SpO2 98%  Physical Exam  Physical Exam  Constitutional: She is oriented to person, place, and time and well-developed, well-nourished, and in no distress. No distress.  HENT:  Head: Normocephalic and atraumatic.  Eyes: Conjunctivae are normal.  Neck: Neck supple. No thyromegaly present.  Cardiovascular: Normal rate, regular rhythm and normal heart sounds.   No murmur heard. Pulmonary/Chest: Effort normal and breath sounds normal. She has no wheezes.  Abdominal: She exhibits no distension and no mass.  Musculoskeletal: She exhibits no edema.  allous ball of foot at base of second toe, no redness or fluctuance  Lymphadenopathy:    She has no cervical adenopathy.  Neurological: She is alert and oriented to person, place, and time.  Skin: Skin is warm and dry. No rash noted. She is not diaphoretic.  Psychiatric: Memory, affect and judgment normal.    Lab Results  Component Value Date   TSH 1.148 10/27/2012   Lab Results  Component Value Date   WBC 7.8 01/19/2013   HGB 12.6 01/19/2013   HCT 35.7* 01/19/2013   MCV 87.1 01/19/2013   PLT 210 01/19/2013   Lab Results  Component Value Date   CREATININE 1.44* 03/07/2013   BUN 30* 03/07/2013   NA 141 03/07/2013   K 4.8 03/07/2013   CL 108 03/07/2013   CO2 24 03/07/2013   Lab Results  Component Value Date   ALT 19 11/14/2012   AST 18 11/14/2012   ALKPHOS 67 11/14/2012   BILITOT 0.8 11/14/2012   Lab Results  Component Value Date   CHOL 160 10/27/2012   Lab Results  Component Value Date   HDL 45 10/27/2012   Lab Results  Component Value Date   LDLCALC 74 10/27/2012   Lab Results  Component Value Date   TRIG 204* 10/27/2012   Lab  Results  Component Value Date   CHOLHDL 3.6 10/27/2012     Assessment & Plan  HYPERTENSION Adequate control, minimize sodium and caffeine  CORONARY ARTERY DISEASE asymptomatic  Right foot pain Callous noted at base of right 2nd toe no erythema or skin breakdown. Has been set up with podiatry will follow up there if no improvement. May use corn pads to decrease stress on lesion

## 2013-04-10 NOTE — Assessment & Plan Note (Signed)
asymptomatic

## 2013-04-10 NOTE — Assessment & Plan Note (Signed)
Adequate control, minimize sodium and caffeine

## 2013-04-24 ENCOUNTER — Telehealth: Payer: Self-pay | Admitting: Family Medicine

## 2013-04-24 MED ORDER — INSULIN GLARGINE 100 UNIT/ML ~~LOC~~ SOLN: 35.0000 [IU] | Freq: Every day | SUBCUTANEOUS | Status: DC

## 2013-04-24 NOTE — Telephone Encounter (Signed)
Refill- lantus solostar  humalog 100 units/ml vial

## 2013-04-27 ENCOUNTER — Telehealth: Payer: Self-pay | Admitting: Family Medicine

## 2013-04-27 MED ORDER — INSULIN LISPRO 100 UNIT/ML (KWIKPEN): 5.0000 [IU] | PEN_INJECTOR | Freq: Three times a day (TID) | SUBCUTANEOUS | Status: DC

## 2013-04-27 NOTE — Telephone Encounter (Signed)
Refill- humalog

## 2013-05-06 ENCOUNTER — Other Ambulatory Visit: Payer: Self-pay | Admitting: Family Medicine

## 2013-05-08 ENCOUNTER — Other Ambulatory Visit: Payer: Self-pay | Admitting: Family Medicine

## 2013-05-16 ENCOUNTER — Other Ambulatory Visit: Payer: Self-pay | Admitting: Family Medicine

## 2013-05-16 DIAGNOSIS — G4733 Obstructive sleep apnea (adult) (pediatric): Secondary | ICD-10-CM

## 2013-05-19 ENCOUNTER — Telehealth: Payer: Self-pay | Admitting: Pulmonary Disease

## 2013-05-19 DIAGNOSIS — G4733 Obstructive sleep apnea (adult) (pediatric): Secondary | ICD-10-CM

## 2013-05-19 NOTE — Telephone Encounter (Signed)
Home study showed mild obstructive sleep apnea -8/h Suggest split study in lab if agreeable

## 2013-05-23 ENCOUNTER — Encounter: Payer: Self-pay | Admitting: Pulmonary Disease

## 2013-05-23 DIAGNOSIS — G4733 Obstructive sleep apnea (adult) (pediatric): Secondary | ICD-10-CM

## 2013-05-23 NOTE — Telephone Encounter (Signed)
I spoke with patient about results and she verbalized understanding and had no questions 

## 2013-06-09 ENCOUNTER — Ambulatory Visit (INDEPENDENT_AMBULATORY_CARE_PROVIDER_SITE_OTHER): Payer: Medicare Other | Admitting: Family Medicine

## 2013-06-09 ENCOUNTER — Encounter: Payer: Self-pay | Admitting: Family Medicine

## 2013-06-09 ENCOUNTER — Ambulatory Visit: Payer: Medicare Other | Admitting: Family Medicine

## 2013-06-09 VITALS — BP 140/72 | HR 50 | Temp 97.6°F | Ht 66.5 in | Wt 161.1 lb

## 2013-06-09 DIAGNOSIS — Z794 Long term (current) use of insulin: Secondary | ICD-10-CM

## 2013-06-09 DIAGNOSIS — N259 Disorder resulting from impaired renal tubular function, unspecified: Secondary | ICD-10-CM

## 2013-06-09 DIAGNOSIS — E119 Type 2 diabetes mellitus without complications: Secondary | ICD-10-CM

## 2013-06-09 DIAGNOSIS — E785 Hyperlipidemia, unspecified: Secondary | ICD-10-CM

## 2013-06-09 DIAGNOSIS — I10 Essential (primary) hypertension: Secondary | ICD-10-CM

## 2013-06-09 LAB — LIPID PANEL
CHOL/HDL RATIO: 3.5 ratio
CHOLESTEROL: 182 mg/dL (ref 0–200)
HDL: 52 mg/dL (ref >39)
LDL Cholesterol: 78 mg/dL (ref 0–99)
Triglycerides: 262 mg/dL — ABNORMAL HIGH (ref <150)
VLDL: 52 mg/dL — ABNORMAL HIGH (ref 0–40)

## 2013-06-09 LAB — RENAL FUNCTION PANEL
ALBUMIN: 4.3 g/dL (ref 3.5–5.2)
BUN: 36 mg/dL — ABNORMAL HIGH (ref 6–23)
CALCIUM: 9.7 mg/dL (ref 8.4–10.5)
CO2: 26 mEq/L (ref 19–32)
Chloride: 104 mEq/L (ref 96–112)
Creat: 1.5 mg/dL — ABNORMAL HIGH (ref 0.50–1.10)
Glucose, Bld: 183 mg/dL — ABNORMAL HIGH (ref 70–99)
POTASSIUM: 5.1 meq/L (ref 3.5–5.3)
Phosphorus: 3.4 mg/dL (ref 2.3–4.6)
SODIUM: 141 meq/L (ref 135–145)

## 2013-06-09 LAB — HEPATIC FUNCTION PANEL
ALT: 17 U/L (ref 0–35)
AST: 19 U/L (ref 0–37)
Albumin: 4.3 g/dL (ref 3.5–5.2)
Alkaline Phosphatase: 61 U/L (ref 39–117)
BILIRUBIN DIRECT: 0.2 mg/dL (ref 0.0–0.3)
BILIRUBIN INDIRECT: 1 mg/dL (ref 0.2–1.2)
BILIRUBIN TOTAL: 1.2 mg/dL (ref 0.2–1.2)
Total Protein: 7.2 g/dL (ref 6.0–8.3)

## 2013-06-09 LAB — CBC
HCT: 37 % (ref 36.0–46.0)
Hemoglobin: 12.7 g/dL (ref 12.0–15.0)
MCH: 30.5 pg (ref 26.0–34.0)
MCHC: 34.3 g/dL (ref 30.0–36.0)
MCV: 88.7 fL (ref 78.0–100.0)
Platelets: 178 10*3/uL (ref 150–400)
RBC: 4.17 MIL/uL (ref 3.87–5.11)
RDW: 13.8 % (ref 11.5–15.5)
WBC: 7 10*3/uL (ref 4.0–10.5)

## 2013-06-09 LAB — HEMOGLOBIN A1C
Hgb A1c MFr Bld: 8 % — ABNORMAL HIGH (ref <5.7)
MEAN PLASMA GLUCOSE: 183 mg/dL — AB (ref <117)

## 2013-06-09 NOTE — Patient Instructions (Signed)

## 2013-06-10 LAB — TSH: TSH: 1.801 u[IU]/mL (ref 0.350–4.500)

## 2013-06-11 ENCOUNTER — Encounter: Payer: Self-pay | Admitting: Family Medicine

## 2013-06-11 NOTE — Assessment & Plan Note (Signed)
Stable maintain adequate hydration

## 2013-06-11 NOTE — Progress Notes (Signed)
Patient ID: Destiny [name], female   DOB: Oct 07, 1931, 80 y.o.   MRN: [id number] Destiny [name] [id number] November 06, 1931 06/11/2013      Progress Note-Follow Up  Subjective  Chief Complaint  No chief complaint on file.   HPI  Patient is a 80 year old female in today for routine medical care. Doing well. Is with her daughter. Notes her blood sugars are ranging between 102 100 but that recently they have been running closer to 200. She denies polyuria polydipsia. No recent acute illness. Denies CP/palp/SOB/HA/congestion/fevers/GI or GU c/o. Taking meds as prescribed  Past Medical History  Diagnosis Date  . PVD (peripheral vascular disease)   . CAD (coronary artery disease)     a. s/p CABG x 4 (LIMA->LAD, VG->Diag, VG->OM1->OM3);  b. 2010 Cath: 3/4 patent grafts (VG->diag occluded), 3vd, sev PVD ->Med Rx;  c. 12/2011 Lexi MV: sm area of mild mid and apical ant wall ischemia ->med rx.  . Hypertension   . Hyperlipidemia   . Duodenal ulcer 2009 and 11/2011  . Iron deficiency anemia secondary to blood loss (chronic)   . Amaurosis fugax   . Carotid bruit   . Diabetes mellitus type II   . Dementia     pt denies  . Diabetic retinopathy   . Gallstones 2009    seen on CT scan  . Diverticulosis   . Atherosclerosis   . Renal insufficiency   . CVA (cerebral infarction)   . Diabetic peripheral neuropathy   . GI bleed May 23, 2011, 11/2011    Cause not defined.   . Colon polyp   . Arthritis 12/06/2012  . Hypersomnolence 03/12/2013  . Right foot pain 04/10/2013    Past Surgical History  Procedure Laterality Date  . Coronary artery bypass graft  1999    x 4  . Abdominal hysterectomy    . Upper gastrointestinal endoscopy  02/22/2008    w/biopsy, duedenal ulcer, antrum erosions  . Esophagogastroduodenoscopy  05/16/2011    Procedure: ESOPHAGOGASTRODUODENOSCOPY (EGD);  Surgeon: Gatha Mayer, MD;  Location: [facility] ENDOSCOPY;  Service: Endoscopy;  Laterality: N/A;  . Colonoscopy  05/20/2011    Procedure:  COLONOSCOPY;  Surgeon: Scarlette Shorts, MD;  Location: [place];  Service: Endoscopy;  Laterality: N/A;  . Esophagogastroduodenoscopy  12/08/2011    Procedure: ESOPHAGOGASTRODUODENOSCOPY (EGD);  Surgeon: Ladene Artist, MD,FACG;  Location: [facility] ENDOSCOPY;  Service: Endoscopy;  Laterality: N/A;    Family History  Problem Relation Age of Onset  . Coronary artery disease Father   . Hypertension Father   . Stroke Father   . Cancer Mother     ?  . Diabetes Maternal Uncle   . Colon cancer Neg Hx     History   Social History  . Marital Status: Widowed    Spouse Name: N/A    Number of Children: 4  . Years of Education: N/A   Occupational History  . retired    Social History Main Topics  . Smoking status: Former Smoker    Types: Cigarettes    Quit date: 12/31/2000  . Smokeless tobacco: Never Used  . Alcohol Use: No  . Drug Use: No  . Sexual Activity: Not on file   Other Topics Concern  . Not on file   Social History Narrative   Widowed - husband passed away in May 22, 2009   lives with daughter with epilepsy - mental retardation   Retired     Former Smoker      Alcohol use-no  Occupation: Retired       son - Destiny [name]     Current Outpatient Prescriptions on File Prior to Visit  Medication Sig Dispense Refill  . amLODipine (NORVASC) 10 MG tablet TAKE 1 TABLET (10 MG TOTAL) BY MOUTH DAILY.  30 tablet  3  . aspirin 81 MG tablet Take 1 tablet (81 mg total) by mouth daily.  30 tablet    . Cholecalciferol (VITAMIN D) 1000 UNITS capsule Take 1,000 Units by mouth every evening.       . clopidogrel (PLAVIX) 75 MG tablet Take 75 mg by mouth daily.      . CRESTOR 20 MG tablet TAKE 1 TABLET BY MOUTH AT BEDTIME  90 tablet  0  . donepezil (ARICEPT) 10 MG tablet Take 1 tablet (10 mg total) by mouth at bedtime as needed.  30 tablet  5  . Ferrous Sulfate (IRON) 325 (65 FE) MG TABS Take 325 mg by mouth 2 (two) times daily.       . furosemide (LASIX) 20 MG tablet TAKE 1 TABLET (20  MG TOTAL) BY MOUTH DAILY.  30 tablet  2  . Glucosamine-Chondroit-Vit C-Mn (GLUCOSAMINE CHONDR 500 COMPLEX) CAPS Take 1 capsule by mouth daily.       . insulin glargine (LANTUS) 100 UNIT/ML injection Inject 0.35 mLs (35 Units total) into the skin at bedtime.  10 mL  4  . insulin lispro (HUMALOG KWIKPEN) 100 UNIT/ML KiwkPen Inject 5-10 Units into the skin 3 (three) times daily with meals.  15 mL  1  . isosorbide mononitrate (IMDUR) 60 MG 24 hr tablet Take 1 tablet (60 mg total) by mouth daily.  90 tablet  1  . nitroGLYCERIN (NITROSTAT) 0.4 MG SL tablet Place 1 tablet under the tongue every 5 min prn  25 tablet  0  . Omega-3 Fatty Acids (FISH OIL) 1200 MG CAPS Take 1,200 mg by mouth daily.       . pantoprazole (PROTONIX) 40 MG tablet EVERY DAY  30 tablet  3  . quinapril (ACCUPRIL) 40 MG tablet Take 0.5 tablets (20 mg total) by mouth daily.  45 tablet  1  . triamterene-hydrochlorothiazide (MAXZIDE-25) 37.5-25 MG per tablet Take 1 tablet by mouth daily.  30 tablet  3  . vitamin B-12 (CYANOCOBALAMIN) 1000 MCG tablet Take 1,000 mcg by mouth daily.      . WELCHOL 625 MG tablet TAKE 1 OR 2 TABLETS BY MOUTH TWICE A DAY WITH FOOD  360 tablet  1   No current facility-administered medications on file prior to visit.    No Known Allergies  Review of Systems  Review of Systems  Constitutional: Negative for fever and malaise/fatigue.  HENT: Negative for congestion.   Eyes: Negative for discharge.  Respiratory: Negative for shortness of breath.   Cardiovascular: Negative for chest pain, palpitations and leg swelling.  Gastrointestinal: Negative for nausea, abdominal pain and diarrhea.  Genitourinary: Negative for dysuria.  Musculoskeletal: Negative for falls.  Skin: Negative for rash.  Neurological: Negative for loss of consciousness and headaches.  Endo/Heme/Allergies: Negative for polydipsia.  Psychiatric/Behavioral: Negative for depression and suicidal ideas. The patient is not nervous/anxious and  does not have insomnia.     Objective  BP 140/72  Pulse 50  Temp(Src) 97.6 F (36.4 C) (Oral)  Ht 5' 6.5" (1.689 m)  Wt 161 lb 1.9 oz (73.084 kg)  BMI 25.62 kg/m2  SpO2 97%  Physical Exam  Physical Exam  Constitutional: She is oriented to person, place, and  time and well-developed, well-nourished, and in no distress. No distress.  HENT:  Head: Normocephalic and atraumatic.  Eyes: Conjunctivae are normal.  Neck: Neck supple. No thyromegaly present.  Cardiovascular: Normal rate, regular rhythm and normal heart sounds.   No murmur heard. Pulmonary/Chest: Effort normal and breath sounds normal. She has no wheezes.  Abdominal: She exhibits no distension and no mass.  Musculoskeletal: She exhibits no edema.  Lymphadenopathy:    She has no cervical adenopathy.  Neurological: She is alert and oriented to person, place, and time.  Skin: Skin is warm and dry. No rash noted. She is not diaphoretic.  Psychiatric: Memory, affect and judgment normal.    Lab Results  Component Value Date   TSH 1.801 06/09/2013   Lab Results  Component Value Date   WBC 7.0 06/09/2013   HGB 12.7 06/09/2013   HCT 37.0 06/09/2013   MCV 88.7 06/09/2013   PLT 178 06/09/2013   Lab Results  Component Value Date   CREATININE 1.50* 06/09/2013   BUN 36* 06/09/2013   NA 141 06/09/2013   K 5.1 06/09/2013   CL 104 06/09/2013   CO2 26 06/09/2013   Lab Results  Component Value Date   ALT 17 06/09/2013   AST 19 06/09/2013   ALKPHOS 61 06/09/2013   BILITOT 1.2 06/09/2013   Lab Results  Component Value Date   CHOL 182 06/09/2013   Lab Results  Component Value Date   HDL 52 06/09/2013   Lab Results  Component Value Date   LDLCALC 78 06/09/2013   Lab Results  Component Value Date   TRIG 262* 06/09/2013   Lab Results  Component Value Date   CHOLHDL 3.5 06/09/2013     Assessment & Plan  HYPERTENSION Well controlled, no changes to meds. Encouraged heart healthy diet such as the DASH diet and exercise as  tolerated.   HYPERLIPIDEMIA Tolerating statin, encouraged heart healthy diet, avoid trans fats, minimize simple carbs and saturated fats. Increase exercise as tolerated  RENAL INSUFFICIENCY Stable maintain adequate hydration  Diabetes mellitus type 2, insulin dependent A1C up to 8 increase lantus by 2 units every 3 days til, sugars remain above 100 but are closer to 100, do not skip meals

## 2013-06-11 NOTE — Assessment & Plan Note (Signed)
Tolerating statin, encouraged heart healthy diet, avoid trans fats, minimize simple carbs and saturated fats. Increase exercise as tolerated 

## 2013-06-11 NOTE — Assessment & Plan Note (Signed)
A1C up to 8 increase lantus by 2 units every 3 days til, sugars remain above 100 but are closer to 100, do not skip meals

## 2013-06-11 NOTE — Assessment & Plan Note (Signed)
Well controlled, no changes to meds. Encouraged heart healthy diet such as the DASH diet and exercise as tolerated.  °

## 2013-06-27 ENCOUNTER — Ambulatory Visit (HOSPITAL_BASED_OUTPATIENT_CLINIC_OR_DEPARTMENT_OTHER): Payer: Medicare Other | Attending: Pulmonary Disease | Admitting: Sleep Medicine

## 2013-06-27 ENCOUNTER — Other Ambulatory Visit: Payer: Self-pay | Admitting: Family Medicine

## 2013-06-27 VITALS — Ht 67.2 in | Wt 161.0 lb

## 2013-06-27 DIAGNOSIS — G4710 Hypersomnia, unspecified: Secondary | ICD-10-CM

## 2013-06-27 DIAGNOSIS — G4730 Sleep apnea, unspecified: Secondary | ICD-10-CM

## 2013-06-27 DIAGNOSIS — G4733 Obstructive sleep apnea (adult) (pediatric): Secondary | ICD-10-CM | POA: Insufficient documentation

## 2013-06-28 ENCOUNTER — Telehealth: Payer: Self-pay | Admitting: Pulmonary Disease

## 2013-06-28 DIAGNOSIS — G4733 Obstructive sleep apnea (adult) (pediatric): Secondary | ICD-10-CM

## 2013-06-28 DIAGNOSIS — G4710 Hypersomnia, unspecified: Secondary | ICD-10-CM

## 2013-06-28 DIAGNOSIS — G4730 Sleep apnea, unspecified: Secondary | ICD-10-CM

## 2013-06-28 NOTE — Telephone Encounter (Signed)
Pt returning call.Destiny [name] ° °

## 2013-06-28 NOTE — Sleep Study (Signed)
[place]   NAME: FALCON CASALE  DATE OF BIRTH: 1931/11/22  MEDICAL RECORD [id number]  LOCATION: [facility]   PHYSICIAN: Andora Krull V.   DATE OF STUDY: 06/27/13   SLEEP STUDY TYPE: Nocturnal Polysomnogram   REFERRING PHYSICIAN: Rigoberto Noel, MD   INDICATION FOR STUDY:  80 year old with excessive daytime somnolence and recent memory deficits. Home study showed mild obstructive sleep apnea -8/h  At the time of this study ,they weighed 161 pounds with a height of 5 ft 6 inches and the BMI of 26, neck size of 15 inches. Epworth sleepiness score was 3   This nocturnal polysomnogram was performed with a sleep technologist in attendance. EEG, EOG,EMG and respiratory parameters recorded. Sleep stages, arousals, limb movements and respiratory data was scored according to criteria laid out by the American Academy of sleep medicine.   SLEEP ARCHITECTURE: Lights out was at 22 O2 PM and lights on was at 449 AM. Total sleep time was 260 minutes with a sleep period time of 362 minutes and a sleep efficiency of 64 %. Sleep latency was 44 minutes with latency to REM sleep of 136 minutes and wake after sleep onset of 102 minutes. . Sleep stages as a percentage of total sleep time was N1 -8 %,N2- 42.7 % and REM sleep 8 % ( 21 minutes) . The longest period of REM sleep was around 1 AM.   AROUSAL DATA : There were 54  arousals with an arousal index of 2.5 events per hour. Most of these were spontaneous & for were associated with respiratory events  RESPIRATORY DATA: There were 0 obstructive apneas, 1 central apneas, 0 mixed apneas and 3 hypopneas with apnea -hypopnea index of 0.9 events per hour. There were 15 RERAs with an RDI of 4.4 events per hour. There was no relation to sleep stage or body position. Supine sleep was  noted  MOVEMENT/PARASOMNIA: Isolated and a few periodic limb movements were noted. These were not associated with arousals.  OXYGEN  DATA: The lowest desaturation was 92 % during REM sleep and the desaturation index was 2.8 per hour.   CARDIAC DATA: The low heart rate was 37 beats per minute. The high heart rate recorded was an artifact. No arrhythmias were noted   DISCUSSION -mild snoring was noted . She did not meet criteria for CPAP intervention.   IMPRESSION :  1. no evidence of obstructive sleep apnea or significant oxygen desaturation.  2. No evidence of cardiac arrhythmias or behavioral disturbance during sleep.  3. Sleep efficiency was poor 4. a few periodic limb movements were noted but they were not associated with arousals, significance is unclear. Please correlate with clinical history of restless leg syndrome  RECOMMENDATION:  1. sleep hygiene should be discussed. Other causes of somnolence can be explored if persistent. 2. Patient should be cautioned against driving when sleepy  3. They should be asked to avoid medications with sedative side effects    Nahzir Pohle V.  Diplomate, [employer] of [employer] Medicine    ELECTRONICALLY SIGNED ON: 06/28/2013  [facility]  PH: [phone] FX: [phone]  [place]

## 2013-06-28 NOTE — Telephone Encounter (Signed)
PSG DID not show OSA. No intervention required

## 2013-06-28 NOTE — Telephone Encounter (Signed)
lmomtcb x1 

## 2013-06-29 NOTE — Telephone Encounter (Signed)
I spoke with patient about results and she verbalized understanding and had no questions 

## 2013-07-21 ENCOUNTER — Other Ambulatory Visit: Payer: Self-pay | Admitting: Family Medicine

## 2013-07-21 ENCOUNTER — Other Ambulatory Visit: Payer: Self-pay | Admitting: Physician Assistant

## 2013-07-21 NOTE — Telephone Encounter (Signed)
Rx request to pharmacy/[facility]  

## 2013-07-25 ENCOUNTER — Other Ambulatory Visit: Payer: Self-pay | Admitting: Family Medicine

## 2013-07-29 ENCOUNTER — Other Ambulatory Visit: Payer: Self-pay | Admitting: Family Medicine

## 2013-09-06 ENCOUNTER — Telehealth: Payer: Self-pay | Admitting: Family Medicine

## 2013-09-06 NOTE — Telephone Encounter (Signed)
Unknown if lab received orders/sls 5:50pm??

## 2013-09-06 NOTE — Telephone Encounter (Signed)
Patient here now needs an order for lipid, renal, cbc, tsh, hepatic, hgba1c prior. Diagnostic follow-up: Instructions:

## 2013-09-11 ENCOUNTER — Ambulatory Visit: Payer: Medicare Other | Admitting: Physician Assistant

## 2013-09-11 DIAGNOSIS — Z0289 Encounter for other administrative examinations: Secondary | ICD-10-CM

## 2013-09-13 ENCOUNTER — Encounter: Payer: Self-pay | Admitting: Physician Assistant

## 2013-09-13 ENCOUNTER — Ambulatory Visit (INDEPENDENT_AMBULATORY_CARE_PROVIDER_SITE_OTHER): Payer: Medicare Other | Admitting: Physician Assistant

## 2013-09-13 VITALS — BP 126/64 | HR 52 | Temp 97.6°F | Resp 16 | Ht 66.5 in | Wt 160.2 lb

## 2013-09-13 DIAGNOSIS — E119 Type 2 diabetes mellitus without complications: Secondary | ICD-10-CM

## 2013-09-13 DIAGNOSIS — Z299 Encounter for prophylactic measures, unspecified: Secondary | ICD-10-CM

## 2013-09-13 DIAGNOSIS — Z794 Long term (current) use of insulin: Secondary | ICD-10-CM

## 2013-09-13 DIAGNOSIS — I10 Essential (primary) hypertension: Secondary | ICD-10-CM

## 2013-09-13 DIAGNOSIS — N289 Disorder of kidney and ureter, unspecified: Secondary | ICD-10-CM

## 2013-09-13 LAB — CBC
HCT: 36.7 % (ref 36.0–46.0)
HEMOGLOBIN: 12.3 g/dL (ref 12.0–15.0)
MCH: 29.9 pg (ref 26.0–34.0)
MCHC: 33.5 g/dL (ref 30.0–36.0)
MCV: 89.1 fL (ref 78.0–100.0)
Platelets: 198 10*3/uL (ref 150–400)
RBC: 4.12 MIL/uL (ref 3.87–5.11)
RDW: 13.6 % (ref 11.5–15.5)
WBC: 8.1 10*3/uL (ref 4.0–10.5)

## 2013-09-13 LAB — LIPID PANEL
CHOL/HDL RATIO: 3.7 ratio
Cholesterol: 177 mg/dL (ref 0–200)
HDL: 48 mg/dL (ref >39)
LDL CALC: 73 mg/dL (ref 0–99)
Triglycerides: 278 mg/dL — ABNORMAL HIGH (ref <150)
VLDL: 56 mg/dL — ABNORMAL HIGH (ref 0–40)

## 2013-09-13 LAB — BASIC METABOLIC PANEL
BUN: 45 mg/dL — ABNORMAL HIGH (ref 6–23)
CALCIUM: 9.8 mg/dL (ref 8.4–10.5)
CHLORIDE: 107 meq/L (ref 96–112)
CO2: 20 meq/L (ref 19–32)
CREATININE: 1.7 mg/dL — AB (ref 0.50–1.10)
GLUCOSE: 250 mg/dL — AB (ref 70–99)
Potassium: 5.5 mEq/L — ABNORMAL HIGH (ref 3.5–5.3)
Sodium: 135 mEq/L (ref 135–145)

## 2013-09-13 LAB — HEPATIC FUNCTION PANEL
ALBUMIN: 4.4 g/dL (ref 3.5–5.2)
ALT: 15 U/L (ref 0–35)
AST: 19 U/L (ref 0–37)
Alkaline Phosphatase: 72 U/L (ref 39–117)
Bilirubin, Direct: 0.2 mg/dL (ref 0.0–0.3)
Indirect Bilirubin: 1.1 mg/dL (ref 0.2–1.2)
TOTAL PROTEIN: 7.4 g/dL (ref 6.0–8.3)
Total Bilirubin: 1.3 mg/dL — ABNORMAL HIGH (ref 0.2–1.2)

## 2013-09-13 LAB — HEMOGLOBIN A1C
HEMOGLOBIN A1C: 8.6 % — AB (ref <5.7)
MEAN PLASMA GLUCOSE: 200 mg/dL — AB (ref <117)

## 2013-09-13 LAB — TSH: TSH: 1.729 u[IU]/mL (ref 0.350–4.500)

## 2013-09-13 NOTE — Patient Instructions (Signed)
Please continue medications as directed.  DO NOT SKIP MEALS WHILE ON INSULIN.  Take good care of your feet.  I will call you with your lab results.  Follow-up with Dr. [name] in 3 months.  Return sooner if needed.

## 2013-09-13 NOTE — Assessment & Plan Note (Signed)
Continue current regimen.  Will obtain BMP and A1C.  Diabetic foot exam completed with noted abnormalities.

## 2013-09-13 NOTE — Progress Notes (Signed)
Patient presents to clinic today for follow-up of diabetes and hypertension.  Patient endorses taking medications as prescribed.  BP normotensive in clinic today.  Denies chest pain, palpitations, lightheadedness, dizziness or vision changes. Patient endorses checking blood sugars twice daily.  Endorses AM readings ~ 100 and PM readings in 190s.. Patient is due for diabetic foot examination.  Is currently on ACEI for renovascular protection.  Past Medical History  Diagnosis Date  . PVD (peripheral vascular disease)   . CAD (coronary artery disease)     a. s/p CABG x 4 (LIMA->LAD, VG->Diag, VG->OM1->OM3);  b. 2010 Cath: 3/4 patent grafts (VG->diag occluded), 3vd, sev PVD ->Med Rx;  c. 12/2011 Lexi MV: sm area of mild mid and apical ant wall ischemia ->med rx.  . Hypertension   . Hyperlipidemia   . Duodenal ulcer 2009 and 11/2011  . Iron deficiency anemia secondary to blood loss (chronic)   . Amaurosis fugax   . Carotid bruit   . Diabetes mellitus type II   . Dementia     pt denies  . Diabetic retinopathy   . Gallstones 2009    seen on CT scan  . Diverticulosis   . Atherosclerosis   . Renal insufficiency   . CVA (cerebral infarction)   . Diabetic peripheral neuropathy   . GI bleed 04/2011, 11/2011    Cause not defined.   . Colon polyp   . Arthritis 12/06/2012  . Hypersomnolence 03/12/2013  . Right foot pain 04/10/2013    Current Outpatient Prescriptions on File Prior to Visit  Medication Sig Dispense Refill  . amLODipine (NORVASC) 10 MG tablet TAKE 1 TABLET (10 MG TOTAL) BY MOUTH DAILY.  30 tablet  2  . aspirin 81 MG tablet Take 1 tablet (81 mg total) by mouth daily.  30 tablet    . Cholecalciferol (VITAMIN D) 1000 UNITS capsule Take 1,000 Units by mouth every evening.       . clopidogrel (PLAVIX) 75 MG tablet TAKE 1 TABLET BY MOUTH DAILY.  90 tablet  0  . CRESTOR 20 MG tablet TAKE 1 TABLET BY MOUTH AT BEDTIME  90 tablet  0  . donepezil (ARICEPT) 10 MG tablet Take 1 tablet (10 mg  total) by mouth at bedtime as needed.  30 tablet  5  . Ferrous Sulfate (IRON) 325 (65 FE) MG TABS Take 325 mg by mouth 2 (two) times daily.       . furosemide (LASIX) 20 MG tablet TAKE 1 TABLET (20 MG TOTAL) BY MOUTH DAILY.  30 tablet  2  . Glucosamine-Chondroit-Vit C-Mn (GLUCOSAMINE CHONDR 500 COMPLEX) CAPS Take 1 capsule by mouth daily.       . insulin glargine (LANTUS) 100 UNIT/ML injection Inject 0.35 mLs (35 Units total) into the skin at bedtime.  10 mL  4  . insulin lispro (HUMALOG KWIKPEN) 100 UNIT/ML KiwkPen Inject 5-10 Units into the skin 3 (three) times daily with meals.  15 mL  1  . isosorbide mononitrate (IMDUR) 60 MG 24 hr tablet Take 1 tablet (60 mg total) by mouth daily.  90 tablet  1  . NITROSTAT 0.4 MG SL tablet PLACE 1 TABLET UNDER THE TONGUE EVERY 5 MIN PRN  25 tablet  0  . Omega-3 Fatty Acids (FISH OIL) 1200 MG CAPS Take 1,200 mg by mouth daily.       . pantoprazole (PROTONIX) 40 MG tablet EVERY DAY  30 tablet  3  . quinapril (ACCUPRIL) 40 MG tablet Take 0.5 tablets (20  mg total) by mouth daily.  45 tablet  1  . triamterene-hydrochlorothiazide (MAXZIDE-25) 37.5-25 MG per tablet TAKE 1 TABLET BY MOUTH DAILY.  30 tablet  3  . vitamin B-12 (CYANOCOBALAMIN) 1000 MCG tablet Take 1,000 mcg by mouth daily.      . WELCHOL 625 MG tablet TAKE 1 OR 2 TABLETS BY MOUTH TWICE A DAY WITH FOOD  360 tablet  1   No current facility-administered medications on file prior to visit.    No Known Allergies  Family History  Problem Relation Age of Onset  . Coronary artery disease Father   . Hypertension Father   . Stroke Father   . Cancer Mother     ?  . Diabetes Maternal Uncle   . Colon cancer Neg Hx     History   Social History  . Marital Status: Widowed    Spouse Name: N/A    Number of Children: 4  . Years of Education: N/A   Occupational History  . retired    Social History Main Topics  . Smoking status: Former Smoker    Types: Cigarettes    Quit date: 12/31/2000  .  Smokeless tobacco: Never Used  . Alcohol Use: No  . Drug Use: No  . Sexual Activity: None   Other Topics Concern  . None   Social History Narrative   Widowed - husband passed away in Jun 05, 2009   lives with daughter with epilepsy - mental retardation   Retired     Former Smoker      Alcohol use-no          Occupation: Retired       son - Ardene Hochhalter    Review of Systems - See HPI.  All other ROS are negative.  BP 126/64  Pulse 52  Temp(Src) 97.6 F (36.4 C) (Oral)  Resp 16  Ht 5' 6.5" (1.689 m)  Wt 160 lb 4 oz (72.689 kg)  BMI 25.48 kg/m2  SpO2 96%  Physical Exam  Vitals reviewed. Constitutional: She is oriented to person, place, and time and well-developed, well-nourished, and in no distress.  HENT:  Head: Normocephalic and atraumatic.  Eyes: Conjunctivae are normal. Pupils are equal, round, and reactive to light.  Neck: Neck supple.  Cardiovascular: Normal rate, regular rhythm, normal heart sounds and intact distal pulses.   Pulmonary/Chest: Effort normal and breath sounds normal. No respiratory distress. She has no wheezes. She has no rales. She exhibits no tenderness.  Neurological: She is alert and oriented to person, place, and time.   Assessment/Plan: HYPERTENSION BP well controlled on current regimen.  Continue current medications.  Diabetes mellitus type 2, insulin dependent Continue current regimen.  Will obtain BMP and A1C.  Diabetic foot exam completed with noted abnormalities.

## 2013-09-13 NOTE — Assessment & Plan Note (Signed)
BP well controlled on current regimen.  Continue current medications.

## 2013-09-13 NOTE — Progress Notes (Signed)
Pre visit review using our clinic review tool, if applicable. No additional management support is needed unless otherwise documented below in the visit note/SLS  

## 2013-09-14 ENCOUNTER — Other Ambulatory Visit: Payer: Self-pay | Admitting: Physician Assistant

## 2013-09-14 ENCOUNTER — Telehealth: Payer: Self-pay | Admitting: Physician Assistant

## 2013-09-14 ENCOUNTER — Telehealth: Payer: Self-pay | Admitting: Family Medicine

## 2013-09-14 DIAGNOSIS — E875 Hyperkalemia: Secondary | ICD-10-CM

## 2013-09-14 DIAGNOSIS — N289 Disorder of kidney and ureter, unspecified: Secondary | ICD-10-CM

## 2013-09-14 NOTE — Telephone Encounter (Signed)
Order placed for BMP.  Patient to come in on Monday for blood work to reassess renal function and potassium levels.

## 2013-09-14 NOTE — Telephone Encounter (Signed)
Relevant patient education assigned to patient using Emmi. ° °

## 2013-09-20 ENCOUNTER — Telehealth: Payer: Self-pay | Admitting: Family Medicine

## 2013-09-20 MED ORDER — DONEPEZIL HCL 10 MG PO TABS: 10.0000 mg | ORAL_TABLET | Freq: Every evening | ORAL | Status: DC | PRN

## 2013-09-20 NOTE — Telephone Encounter (Signed)
Refills sent

## 2013-09-20 NOTE — Telephone Encounter (Signed)
Refill-donepezil  [facility] [phone]

## 2013-10-26 ENCOUNTER — Other Ambulatory Visit: Payer: Self-pay | Admitting: Family Medicine

## 2013-10-26 ENCOUNTER — Other Ambulatory Visit: Payer: Self-pay | Admitting: Family

## 2013-10-30 ENCOUNTER — Telehealth: Payer: Self-pay

## 2013-10-30 NOTE — Telephone Encounter (Signed)
Spoke with pt. She's aware. LDM

## 2013-10-30 NOTE — Telephone Encounter (Signed)
Patient left a message stating she doesn't remember how much Lantus or Humalog she is supposed to be taking and would like a call back with this information?

## 2013-11-02 ENCOUNTER — Other Ambulatory Visit: Payer: Self-pay | Admitting: Family Medicine

## 2013-11-02 NOTE — Telephone Encounter (Signed)
Rx sent to pharmacy. LDM 

## 2013-11-22 ENCOUNTER — Other Ambulatory Visit: Payer: Self-pay | Admitting: Family Medicine

## 2013-11-30 ENCOUNTER — Other Ambulatory Visit: Payer: Self-pay | Admitting: Family Medicine

## 2013-12-01 NOTE — Telephone Encounter (Signed)
Medication Detail      Disp Refills Start End     WELCHOL 625 MG tablet 360 tablet 0 11/22/2013     Sig: TAKE 1 OR 2 TABLETS BY MOUTH TWICE A DAY WITH FOOD    E-Prescribing Status: Receipt confirmed by pharmacy (11/22/2013 11:48 AM EDT)    Pharmacy    [id number] - [place], [place] - [address]   Rx sent 09.09.15, #[id number]-too soon for request/sls

## 2013-12-07 ENCOUNTER — Ambulatory Visit: Payer: Medicare Other | Admitting: Family Medicine

## 2013-12-13 ENCOUNTER — Other Ambulatory Visit: Payer: Self-pay | Admitting: Family Medicine

## 2013-12-13 ENCOUNTER — Ambulatory Visit (INDEPENDENT_AMBULATORY_CARE_PROVIDER_SITE_OTHER): Payer: Medicare Other | Admitting: Physician Assistant

## 2013-12-13 ENCOUNTER — Encounter: Payer: Self-pay | Admitting: Physician Assistant

## 2013-12-13 ENCOUNTER — Telehealth: Payer: Self-pay | Admitting: Family Medicine

## 2013-12-13 VITALS — BP 108/52 | HR 70 | Resp 16 | Ht 66.0 in | Wt 155.1 lb

## 2013-12-13 DIAGNOSIS — E1129 Type 2 diabetes mellitus with other diabetic kidney complication: Secondary | ICD-10-CM | POA: Insufficient documentation

## 2013-12-13 DIAGNOSIS — E1165 Type 2 diabetes mellitus with hyperglycemia: Secondary | ICD-10-CM

## 2013-12-13 DIAGNOSIS — E119 Type 2 diabetes mellitus without complications: Secondary | ICD-10-CM

## 2013-12-13 DIAGNOSIS — A088 Other specified intestinal infections: Secondary | ICD-10-CM

## 2013-12-13 DIAGNOSIS — A084 Viral intestinal infection, unspecified: Secondary | ICD-10-CM | POA: Insufficient documentation

## 2013-12-13 DIAGNOSIS — IMO0002 Reserved for concepts with insufficient information to code with codable children: Secondary | ICD-10-CM | POA: Insufficient documentation

## 2013-12-13 LAB — POCT CBG (FASTING - GLUCOSE)-MANUAL ENTRY: GLUCOSE FASTING, POC: 198 mg/dL — AB (ref 70–99)

## 2013-12-13 NOTE — Patient Instructions (Addendum)
Please obtain labs.  I will call you with your results.  Increase your fluid intake.  Read the diet for diarrhea below.  You can take some immodium for diarrhea if it recurs.  Use ginger ale for nausea.  For insulin -- increase Lantus by 1 unit each night as long as blood sugar the next morning is > 100.  Continue 5 units of Humalog at meal time if you are going to be eating.  Follow-up in Monday with Dr. [name].  If you develop severe vomiting, diarrhea, shortness of breath or lethargy, call 911.   Food Choices to Help Relieve Diarrhea When you have diarrhea, the foods you eat and your eating habits are very important. Choosing the right foods and drinks can help relieve diarrhea. Also, because diarrhea can last up to 7 days, you need to replace lost fluids and electrolytes (such as sodium, potassium, and chloride) in order to help prevent dehydration.  WHAT GENERAL GUIDELINES DO I NEED TO FOLLOW?  Slowly drink 1 cup (8 oz) of fluid for each episode of diarrhea. If you are getting enough fluid, your urine will be clear or pale yellow.  Eat starchy foods. Some good choices include white rice, white toast, pasta, low-fiber cereal, baked potatoes (without the skin), saltine crackers, and bagels.  Avoid large servings of any cooked vegetables.  Limit fruit to two servings per day. A serving is  cup or 1 small piece.  Choose foods with less than 2 g of fiber per serving.  Limit fats to less than 8 tsp (38 g) per day.  Avoid fried foods.  Eat foods that have probiotics in them. Probiotics can be found in certain dairy products.  Avoid foods and beverages that may increase the speed at which food moves through the stomach and intestines (gastrointestinal tract). Things to avoid include:  High-fiber foods, such as dried fruit, raw fruits and vegetables, nuts, seeds, and whole grain foods.  Spicy foods and high-fat foods.  Foods and beverages sweetened with high-fructose corn syrup, honey, or  sugar alcohols such as xylitol, sorbitol, and mannitol. WHAT FOODS ARE RECOMMENDED? Grains White rice. White, [place], or pita breads (fresh or toasted), including plain rolls, buns, or bagels. White pasta. Saltine, soda, or graham crackers. Pretzels. Low-fiber cereal. Cooked cereals made with water (such as cornmeal, farina, or cream cereals). Plain muffins. Matzo. Melba toast. Zwieback.  Vegetables Potatoes (without the skin). Strained tomato and vegetable juices. Most well-cooked and canned vegetables without seeds. Tender lettuce. Fruits Cooked or canned applesauce, apricots, cherries, fruit cocktail, grapefruit, peaches, pears, or plums. Fresh bananas, apples without skin, cherries, grapes, cantaloupe, grapefruit, peaches, oranges, or plums.  Meat and Other Protein Products Baked or boiled chicken. Eggs. Tofu. Fish. Seafood. Smooth peanut butter. Ground or well-cooked tender beef, ham, veal, lamb, pork, or poultry.  Dairy Plain yogurt, kefir, and unsweetened liquid yogurt. Lactose-free milk, buttermilk, or soy milk. Plain hard cheese. Beverages Sport drinks. Clear broths. Diluted fruit juices (except prune). Regular, caffeine-free sodas such as ginger ale. Water. Decaffeinated teas. Oral rehydration solutions. Sugar-free beverages not sweetened with sugar alcohols. Other Bouillon, broth, or soups made from recommended foods.  The items listed above may not be a complete list of recommended foods or beverages. Contact your dietitian for more options. WHAT FOODS ARE NOT RECOMMENDED? Grains Whole grain, whole wheat, bran, or rye breads, rolls, pastas, crackers, and cereals. Wild or brown rice. Cereals that contain more than 2 g of fiber per serving. Corn tortillas or taco  shells. Cooked or dry oatmeal. Granola. Popcorn. Vegetables Raw vegetables. Cabbage, broccoli, Brussels sprouts, artichokes, baked beans, beet greens, corn, kale, legumes, peas, sweet potatoes, and yams. Potato skins. Cooked  spinach and cabbage. Fruits Dried fruit, including raisins and dates. Raw fruits. Stewed or dried prunes. Fresh apples with skin, apricots, mangoes, pears, raspberries, and strawberries.  Meat and Other Protein Products Chunky peanut butter. Nuts and seeds. Beans and lentils. Berniece Salines.  Dairy High-fat cheeses. Milk, chocolate milk, and beverages made with milk, such as milk shakes. Cream. Ice cream. Sweets and Desserts Sweet rolls, doughnuts, and sweet breads. Pancakes and waffles. Fats and Oils Butter. Cream sauces. Margarine. Salad oils. Plain salad dressings. Olives. Avocados.  Beverages Caffeinated beverages (such as coffee, tea, soda, or energy drinks). Alcoholic beverages. Fruit juices with pulp. Prune juice. Soft drinks sweetened with high-fructose corn syrup or sugar alcohols. Other Coconut. Hot sauce. Chili powder. Mayonnaise. Gravy. Cream-based or milk-based soups.  The items listed above may not be a complete list of foods and beverages to avoid. Contact your dietitian for more information. WHAT SHOULD I DO IF I BECOME DEHYDRATED? Diarrhea can sometimes lead to dehydration. Signs of dehydration include dark urine and dry mouth and skin. If you think you are dehydrated, you should rehydrate with an oral rehydration solution. These solutions can be purchased at pharmacies, retail stores, or online.  Drink -1 cup (120-240 mL) of oral rehydration solution each time you have an episode of diarrhea. If drinking this amount makes your diarrhea worse, try drinking smaller amounts more often. For example, drink 1-3 tsp (5-15 mL) every 5-10 minutes.  A general rule for staying hydrated is to drink 1-2 L of fluid per day. Talk to your health care provider about the specific amount you should be drinking each day. Drink enough fluids to keep your urine clear or pale yellow. Document Released: 05/23/2003 Document Revised: 03/07/2013 Document Reviewed: 01/23/2013 [facility] Patient Information 2015  [place], [place]. This information is not intended to replace advice given to you by your health care provider. Make sure you discuss any questions you have with your health care provider.

## 2013-12-13 NOTE — Assessment & Plan Note (Signed)
Will check CBC, BMP today. Increase fluids. Diet for diarrhea given. Immodium if needed.  Alarm signs/symptoms discussed with patient and son.

## 2013-12-13 NOTE — Progress Notes (Signed)
Patient presents to clinic today c/o 3-4 days of nausea and loose stools.  Denies fever, chills, but endorses malaise. Denies emesis.  Denies hematochezia,melena or tenesmus. Notes mild abdominal cramping.  Denies recent travel, new food or water source.  Also noted fasting glucose still around 190-200.  Has not increased Lantus per instructions at last visit.  Past Medical History  Diagnosis Date  . PVD (peripheral vascular disease)   . CAD (coronary artery disease)     a. s/p CABG x 4 (LIMA->LAD, VG->Diag, VG->OM1->OM3);  b. 2010 Cath: 3/4 patent grafts (VG->diag occluded), 3vd, sev PVD ->Med Rx;  c. 12/2011 Lexi MV: sm area of mild mid and apical ant wall ischemia ->med rx.  . Hypertension   . Hyperlipidemia   . Duodenal ulcer 2009 and 11/2011  . Iron deficiency anemia secondary to blood loss (chronic)   . Amaurosis fugax   . Carotid bruit   . Diabetes mellitus type II   . Dementia     pt denies  . Diabetic retinopathy   . Gallstones 2009    seen on CT scan  . Diverticulosis   . Atherosclerosis   . Renal insufficiency   . CVA (cerebral infarction)   . Diabetic peripheral neuropathy   . GI bleed 04/2011, 11/2011    Cause not defined.   . Colon polyp   . Arthritis 12/06/2012  . Hypersomnolence 03/12/2013  . Right foot pain 04/10/2013    Current Outpatient Prescriptions on File Prior to Visit  Medication Sig Dispense Refill  . amLODipine (NORVASC) 10 MG tablet TAKE 1 TABLET BY MOUTH EVERY DAY  30 tablet  2  . aspirin 81 MG tablet Take 1 tablet (81 mg total) by mouth daily.  30 tablet    . Cholecalciferol (VITAMIN D) 1000 UNITS capsule Take 1,000 Units by mouth every evening.       . clopidogrel (PLAVIX) 75 MG tablet TAKE 1 TABLET BY MOUTH DAILY.  90 tablet  0  . CRESTOR 20 MG tablet TAKE 1 TABLET BY MOUTH AT BEDTIME  90 tablet  0  . donepezil (ARICEPT) 10 MG tablet Take 1 tablet (10 mg total) by mouth at bedtime as needed.  30 tablet  5  . Ferrous Sulfate (IRON) 325 (65 FE) MG  TABS Take 325 mg by mouth 2 (two) times daily.       . furosemide (LASIX) 20 MG tablet TAKE 1 TABLET (20 MG TOTAL) BY MOUTH DAILY.  30 tablet  2  . Glucosamine-Chondroit-Vit C-Mn (GLUCOSAMINE CHONDR 500 COMPLEX) CAPS Take 1 capsule by mouth daily.       . insulin glargine (LANTUS) 100 UNIT/ML injection Inject 0.35 mLs (35 Units total) into the skin at bedtime.  10 mL  4  . insulin lispro (HUMALOG KWIKPEN) 100 UNIT/ML KiwkPen Inject 5-10 Units into the skin 3 (three) times daily with meals.  15 mL  1  . isosorbide mononitrate (IMDUR) 60 MG 24 hr tablet TAKE 1 TABLET BY MOUTH EVERY DAY  90 tablet  0  . NITROSTAT 0.4 MG SL tablet PLACE 1 TABLET UNDER THE TONGUE EVERY 5 MIN PRN  25 tablet  0  . Omega-3 Fatty Acids (FISH OIL) 1200 MG CAPS Take 1,200 mg by mouth daily.       . pantoprazole (PROTONIX) 40 MG tablet EVERY DAY  30 tablet  3  . quinapril (ACCUPRIL) 40 MG tablet TAKE HALF A TABLET BY MOUTH EVERY DAY  60 tablet  0  . triamterene-hydrochlorothiazide (  MAXZIDE-25) 37.5-25 MG per tablet TAKE 1 TABLET BY MOUTH DAILY.  30 tablet  3  . vitamin B-12 (CYANOCOBALAMIN) 1000 MCG tablet Take 1,000 mcg by mouth daily.      . WELCHOL 625 MG tablet TAKE 1 OR 2 TABLETS BY MOUTH TWICE A DAY WITH FOOD  360 tablet  0   No current facility-administered medications on file prior to visit.    No Known Allergies  Family History  Problem Relation Age of Onset  . Coronary artery disease Father   . Hypertension Father   . Stroke Father   . Cancer Mother     ?  . Diabetes Maternal Uncle   . Colon cancer Neg Hx     History   Social History  . Marital Status: Widowed    Spouse Name: N/A    Number of Children: 4  . Years of Education: N/A   Occupational History  . retired    Social History Main Topics  . Smoking status: Former Smoker    Types: Cigarettes    Quit date: 12/31/2000  . Smokeless tobacco: Never Used  . Alcohol Use: No  . Drug Use: No  . Sexual Activity: None   Other Topics Concern    . None   Social History Narrative   Widowed - husband passed away in 05/12/2009   lives with daughter with epilepsy - mental retardation   Retired     Former Smoker      Alcohol use-no          Occupation: Retired       son - Amillia Soria    Review of Systems - See HPI.  All other ROS are negative.  BP 108/52  Pulse 70  Resp 16  Ht 5\' 6"  (1.676 m)  Wt 155 lb 2 oz (70.364 kg)  BMI 25.05 kg/m2  SpO2 98%  Physical Exam  Vitals reviewed. Constitutional: She is oriented to person, place, and time and well-developed, well-nourished, and in no distress.  HENT:  Head: Normocephalic and atraumatic.  Eyes: Conjunctivae are normal.  Neck: Neck supple.  Cardiovascular: Normal rate, regular rhythm, normal heart sounds and intact distal pulses.   Pulmonary/Chest: Effort normal and breath sounds normal. No respiratory distress. She has no wheezes. She has no rales. She exhibits no tenderness.  Abdominal: Soft. Bowel sounds are normal. She exhibits no distension and no mass. There is no tenderness. There is no rebound and no guarding.  Lymphadenopathy:    She has no cervical adenopathy.  Neurological: She is alert and oriented to person, place, and time.  Skin: Skin is warm and dry. No rash noted.  Psychiatric: Affect normal.   Assessment/Plan: Viral gastroenteritis Will check CBC, BMP today. Increase fluids. Diet for diarrhea given. Immodium if needed.  Alarm signs/symptoms discussed with patient and son.  Type II or unspecified type diabetes mellitus without mention of complication, not stated as uncontrolled Again, increase Lantus by 1 unit every night.  Continue Humalog as directed.  Eat well-balanced meals.  Limit carb intake. Follow-up in 1 week.

## 2013-12-13 NOTE — Telephone Encounter (Signed)
Caller name: Tayen Economou Relation to pt: son Call back number: [phone] Pharmacy:  Reason for call:   Please call patient son [name] with results.

## 2013-12-13 NOTE — Telephone Encounter (Signed)
Please call pt's son if results come in on Thursday/SLS Thanks.

## 2013-12-13 NOTE — Progress Notes (Signed)
Pre visit review using our clinic review tool, if applicable. No additional management support is needed unless otherwise documented below in the visit note/SLS  

## 2013-12-13 NOTE — Assessment & Plan Note (Signed)
Again, increase Lantus by 1 unit every night.  Continue Humalog as directed.  Eat well-balanced meals.  Limit carb intake. Follow-up in 1 week.

## 2013-12-14 ENCOUNTER — Other Ambulatory Visit: Payer: Self-pay

## 2013-12-14 LAB — BASIC METABOLIC PANEL
BUN: 51 mg/dL — ABNORMAL HIGH (ref 6–23)
CALCIUM: 10 mg/dL (ref 8.4–10.5)
CO2: 22 mEq/L (ref 19–32)
Chloride: 104 mEq/L (ref 96–112)
Creatinine, Ser: 2.2 mg/dL — ABNORMAL HIGH (ref 0.4–1.2)
GFR: 23.03 mL/min — ABNORMAL LOW (ref >60.00)
Glucose, Bld: 129 mg/dL — ABNORMAL HIGH (ref 70–99)
Potassium: 4.3 mEq/L (ref 3.5–5.1)
Sodium: 136 mEq/L (ref 135–145)

## 2013-12-14 LAB — CBC
HEMATOCRIT: 35.6 % — AB (ref 36.0–46.0)
HEMOGLOBIN: 12.2 g/dL (ref 12.0–15.0)
MCHC: 34.4 g/dL (ref 30.0–36.0)
MCV: 90.7 fl (ref 78.0–100.0)
Platelets: 219 10*3/uL (ref 150.0–400.0)
RBC: 3.92 Mil/uL (ref 3.87–5.11)
RDW: 13.7 % (ref 11.5–15.5)
WBC: 9.3 10*3/uL (ref 4.0–10.5)

## 2013-12-14 MED ORDER — PANTOPRAZOLE SODIUM 40 MG PO TBEC: 40.0000 mg | DELAYED_RELEASE_TABLET | Freq: Every day | ORAL | Status: DC

## 2013-12-14 MED ORDER — QUINAPRIL HCL 10 MG PO TABS: 10.0000 mg | ORAL_TABLET | Freq: Every day | ORAL | Status: DC

## 2013-12-14 NOTE — Telephone Encounter (Signed)
RX sent

## 2013-12-14 NOTE — Addendum Note (Signed)
Addended by: Varney Daily on: 12/14/2013 04:26 PM   Modules accepted: Orders

## 2013-12-14 NOTE — Telephone Encounter (Signed)
Yes I put in a result note about her labs. They need a call about her mild anemia stable but worsening kidney function, needs to increase hydration and decrease Quinipril to 10 mg daily

## 2013-12-15 NOTE — Telephone Encounter (Signed)
Notes Recorded by Varney Daily, RMA on 12/14/2013 at 4:27 PM Patients son informed and new rx sent to pharmacy. Pt has appt with dr [name] next Friday so we can check renal and bp then Notes Recorded by Mosie Lukes, MD on 12/14/2013 at 1:11 PM So looking at her meds we should drop her Quinapril to 10 mg tab 1 tab daily and check her bp next week when we check her renal panel Notes Recorded by Mosie Lukes, MD on 12/14/2013 at 1:07 PM I believe patient son wants to know results, mild anemia is noted which is not new or concerning but her kidney function is worse. Would recommend increase hydration slightly and recheck renal panel next week

## 2013-12-17 ENCOUNTER — Other Ambulatory Visit: Payer: Self-pay | Admitting: Family Medicine

## 2013-12-18 ENCOUNTER — Telehealth: Payer: Self-pay | Admitting: Family Medicine

## 2013-12-18 MED ORDER — INSULIN LISPRO 100 UNIT/ML (KWIKPEN): 5.0000 [IU] | PEN_INJECTOR | Freq: Three times a day (TID) | SUBCUTANEOUS | Status: DC

## 2013-12-18 MED ORDER — INSULIN GLARGINE 100 UNIT/ML ~~LOC~~ SOLN: 35.0000 [IU] | Freq: Every day | SUBCUTANEOUS | Status: DC

## 2013-12-18 NOTE — Telephone Encounter (Signed)
[id number] sent and [name] informed

## 2013-12-18 NOTE — Telephone Encounter (Signed)
Caller name: [name] Relation to pt:son Call back [phone]  [phone] Pharmacy: [facility]  Reason for call:  Pt needs refills on Rx's  insulin lispro (HUMALOG KWIKPEN) 100 UNIT/ML KiwkPen  insulin glargine (LANTUS) 100 UNIT/ML injection   Call Greg when complete.

## 2013-12-18 NOTE — Telephone Encounter (Signed)
Caller name: Diane  Relation to pt: daughter in law  Call back number: [phone]   Reason for call:   pt is still expericing abdominal pain from last visit 12/13/13. Pt in need of advise.

## 2013-12-18 NOTE — Telephone Encounter (Signed)
So please check in with patient if she is worse may need to go to ED if she is just not much better maybe can be seen in office. Please find out what symptoms are now. Thanks

## 2013-12-18 NOTE — Telephone Encounter (Signed)
Please Advise/SLS

## 2013-12-19 NOTE — Telephone Encounter (Signed)
Spoke with patient and daughter who states that she is feeling some better but seems to decline as the day progresses. States that she ate a small amount for lunch and then pains seem to begin. Feels ok when awakes in the morning and there is no pain that keeps her from sleeping. Patient has an appt already scheduled for Friday 10/9 at 10:15. Advised patient and daughter (on the phone together) that if pain or symptoms change to call the office or go to ED. Patient and daughter both agree that they will call.

## 2013-12-22 ENCOUNTER — Ambulatory Visit (INDEPENDENT_AMBULATORY_CARE_PROVIDER_SITE_OTHER): Payer: Medicare Other | Admitting: Family Medicine

## 2013-12-22 ENCOUNTER — Encounter: Payer: Self-pay | Admitting: Family Medicine

## 2013-12-22 VITALS — BP 130/39 | HR 59 | Temp 97.7°F | Ht 66.0 in | Wt 154.8 lb

## 2013-12-22 DIAGNOSIS — F329 Major depressive disorder, single episode, unspecified: Secondary | ICD-10-CM

## 2013-12-22 DIAGNOSIS — R109 Unspecified abdominal pain: Secondary | ICD-10-CM

## 2013-12-22 DIAGNOSIS — R197 Diarrhea, unspecified: Secondary | ICD-10-CM

## 2013-12-22 DIAGNOSIS — A084 Viral intestinal infection, unspecified: Secondary | ICD-10-CM

## 2013-12-22 DIAGNOSIS — E785 Hyperlipidemia, unspecified: Secondary | ICD-10-CM

## 2013-12-22 DIAGNOSIS — I10 Essential (primary) hypertension: Secondary | ICD-10-CM

## 2013-12-22 DIAGNOSIS — Z794 Long term (current) use of insulin: Secondary | ICD-10-CM

## 2013-12-22 DIAGNOSIS — N3001 Acute cystitis with hematuria: Secondary | ICD-10-CM

## 2013-12-22 DIAGNOSIS — E1165 Type 2 diabetes mellitus with hyperglycemia: Secondary | ICD-10-CM

## 2013-12-22 DIAGNOSIS — N289 Disorder of kidney and ureter, unspecified: Secondary | ICD-10-CM

## 2013-12-22 DIAGNOSIS — E119 Type 2 diabetes mellitus without complications: Secondary | ICD-10-CM

## 2013-12-22 DIAGNOSIS — F32A Depression, unspecified: Secondary | ICD-10-CM

## 2013-12-22 LAB — URINALYSIS, ROUTINE W REFLEX MICROSCOPIC
Bilirubin Urine: NEGATIVE
Ketones, ur: NEGATIVE
NITRITE: NEGATIVE
Specific Gravity, Urine: 1.015 (ref 1.000–1.030)
Total Protein, Urine: NEGATIVE
UROBILINOGEN UA: 0.2 (ref 0.0–1.0)
Urine Glucose: NEGATIVE
pH: 5.5 (ref 5.0–8.0)

## 2013-12-22 LAB — HEPATIC FUNCTION PANEL
ALK PHOS: 64 U/L (ref 39–117)
ALT: 14 U/L (ref 0–35)
AST: 20 U/L (ref 0–37)
Albumin: 3.5 g/dL (ref 3.5–5.2)
BILIRUBIN TOTAL: 0.3 mg/dL (ref 0.2–1.2)
Bilirubin, Direct: 0 mg/dL (ref 0.0–0.3)
Total Protein: 8.1 g/dL (ref 6.0–8.3)

## 2013-12-22 LAB — CBC
HEMATOCRIT: 32 % — AB (ref 36.0–46.0)
HEMOGLOBIN: 11.1 g/dL — AB (ref 12.0–15.0)
MCHC: 34.7 g/dL (ref 30.0–36.0)
MCV: 91.5 fl (ref 78.0–100.0)
Platelets: 342 10*3/uL (ref 150.0–400.0)
RBC: 3.49 Mil/uL — ABNORMAL LOW (ref 3.87–5.11)
RDW: 13.5 % (ref 11.5–15.5)
WBC: 12.2 10*3/uL — ABNORMAL HIGH (ref 4.0–10.5)

## 2013-12-22 LAB — RENAL FUNCTION PANEL
Albumin: 3.5 g/dL (ref 3.5–5.2)
BUN: 88 mg/dL — AB (ref 6–23)
CALCIUM: 9.7 mg/dL (ref 8.4–10.5)
CHLORIDE: 107 meq/L (ref 96–112)
CO2: 13 mEq/L — ABNORMAL LOW (ref 19–32)
CREATININE: 1.9 mg/dL — AB (ref 0.4–1.2)
GFR: 26.99 mL/min — ABNORMAL LOW (ref >60.00)
Glucose, Bld: 177 mg/dL — ABNORMAL HIGH (ref 70–99)
Phosphorus: 3.9 mg/dL (ref 2.3–4.6)
Potassium: 5.2 mEq/L — ABNORMAL HIGH (ref 3.5–5.1)
Sodium: 132 mEq/L — ABNORMAL LOW (ref 135–145)

## 2013-12-22 LAB — TSH: TSH: 1.51 u[IU]/mL (ref 0.35–4.50)

## 2013-12-22 LAB — LDL CHOLESTEROL, DIRECT: Direct LDL: 100.5 mg/dL

## 2013-12-22 LAB — LIPID PANEL
Cholesterol: 176 mg/dL (ref 0–200)
HDL: 40.6 mg/dL (ref >39.00)
NONHDL: 135.4
Total CHOL/HDL Ratio: 4
Triglycerides: 292 mg/dL — ABNORMAL HIGH (ref 0.0–149.0)
VLDL: 58.4 mg/dL — ABNORMAL HIGH (ref 0.0–40.0)

## 2013-12-22 MED ORDER — FUROSEMIDE 20 MG PO TABS: 20.0000 mg | ORAL_TABLET | Freq: Every day | ORAL | Status: DC | PRN

## 2013-12-22 MED ORDER — CITALOPRAM HYDROBROMIDE 10 MG PO TABS: 10.0000 mg | ORAL_TABLET | Freq: Every day | ORAL | Status: DC

## 2013-12-22 MED ORDER — TRIAMTERENE-HCTZ 37.5-25 MG PO TABS: 0.5000 | ORAL_TABLET | Freq: Every day | ORAL | Status: DC

## 2013-12-22 MED ORDER — LOVASTATIN 20 MG PO TABS: 20.0000 mg | ORAL_TABLET | Freq: Every day | ORAL | Status: DC

## 2013-12-22 MED ORDER — CEFDINIR 300 MG PO CAPS: 300.0000 mg | ORAL_CAPSULE | Freq: Two times a day (BID) | ORAL | Status: DC

## 2013-12-22 NOTE — Progress Notes (Signed)
Pre visit review using our clinic review tool, if applicable. No additional management support is needed unless otherwise documented below in the visit note. 

## 2013-12-22 NOTE — Patient Instructions (Signed)
Probiotic daily such as Digestive Advantage or [facility]   Food Choices to Help Relieve Diarrhea When you have diarrhea, the foods you eat and your eating habits are very important. Choosing the right foods and drinks can help relieve diarrhea. Also, because diarrhea can last up to 7 days, you need to replace lost fluids and electrolytes (such as sodium, potassium, and chloride) in order to help prevent dehydration.  WHAT GENERAL GUIDELINES DO I NEED TO FOLLOW?  Slowly drink 1 cup (8 oz) of fluid for each episode of diarrhea. If you are getting enough fluid, your urine will be clear or pale yellow.  Eat starchy foods. Some good choices include white rice, white toast, pasta, low-fiber cereal, baked potatoes (without the skin), saltine crackers, and bagels.  Avoid large servings of any cooked vegetables.  Limit fruit to two servings per day. A serving is  cup or 1 small piece.  Choose foods with less than 2 g of fiber per serving.  Limit fats to less than 8 tsp (38 g) per day.  Avoid fried foods.  Eat foods that have probiotics in them. Probiotics can be found in certain dairy products.  Avoid foods and beverages that may increase the speed at which food moves through the stomach and intestines (gastrointestinal tract). Things to avoid include:  High-fiber foods, such as dried fruit, raw fruits and vegetables, nuts, seeds, and whole grain foods.  Spicy foods and high-fat foods.  Foods and beverages sweetened with high-fructose corn syrup, honey, or sugar alcohols such as xylitol, sorbitol, and mannitol. WHAT FOODS ARE RECOMMENDED? Grains White rice. White, [place], or pita breads (fresh or toasted), including plain rolls, buns, or bagels. White pasta. Saltine, soda, or graham crackers. Pretzels. Low-fiber cereal. Cooked cereals made with water (such as cornmeal, farina, or cream cereals). Plain muffins. Matzo. Melba toast. Zwieback.  Vegetables Potatoes (without the  skin). Strained tomato and vegetable juices. Most well-cooked and canned vegetables without seeds. Tender lettuce. Fruits Cooked or canned applesauce, apricots, cherries, fruit cocktail, grapefruit, peaches, pears, or plums. Fresh bananas, apples without skin, cherries, grapes, cantaloupe, grapefruit, peaches, oranges, or plums.  Meat and Other Protein Products Baked or boiled chicken. Eggs. Tofu. Fish. Seafood. Smooth peanut butter. Ground or well-cooked tender beef, ham, veal, lamb, pork, or poultry.  Dairy Plain yogurt, kefir, and unsweetened liquid yogurt. Lactose-free milk, buttermilk, or soy milk. Plain hard cheese. Beverages Sport drinks. Clear broths. Diluted fruit juices (except prune). Regular, caffeine-free sodas such as ginger ale. Water. Decaffeinated teas. Oral rehydration solutions. Sugar-free beverages not sweetened with sugar alcohols. Other Bouillon, broth, or soups made from recommended foods.  The items listed above may not be a complete list of recommended foods or beverages. Contact your dietitian for more options. WHAT FOODS ARE NOT RECOMMENDED? Grains Whole grain, whole wheat, bran, or rye breads, rolls, pastas, crackers, and cereals. Wild or brown rice. Cereals that contain more than 2 g of fiber per serving. Corn tortillas or taco shells. Cooked or dry oatmeal. Granola. Popcorn. Vegetables Raw vegetables. Cabbage, broccoli, Brussels sprouts, artichokes, baked beans, beet greens, corn, kale, legumes, peas, sweet potatoes, and yams. Potato skins. Cooked spinach and cabbage. Fruits Dried fruit, including raisins and dates. Raw fruits. Stewed or dried prunes. Fresh apples with skin, apricots, mangoes, pears, raspberries, and strawberries.  Meat and Other Protein Products Chunky peanut butter. Nuts and seeds. Beans and lentils. Berniece Salines.  Dairy High-fat cheeses. Milk, chocolate milk, and beverages made with milk, such as milk shakes. Cream.  Ice cream. Sweets and  Desserts Sweet rolls, doughnuts, and sweet breads. Pancakes and waffles. Fats and Oils Butter. Cream sauces. Margarine. Salad oils. Plain salad dressings. Olives. Avocados.  Beverages Caffeinated beverages (such as coffee, tea, soda, or energy drinks). Alcoholic beverages. Fruit juices with pulp. Prune juice. Soft drinks sweetened with high-fructose corn syrup or sugar alcohols. Other Coconut. Hot sauce. Chili powder. Mayonnaise. Gravy. Cream-based or milk-based soups.  The items listed above may not be a complete list of foods and beverages to avoid. Contact your dietitian for more information. WHAT SHOULD I DO IF I BECOME DEHYDRATED? Diarrhea can sometimes lead to dehydration. Signs of dehydration include dark urine and dry mouth and skin. If you think you are dehydrated, you should rehydrate with an oral rehydration solution. These solutions can be purchased at pharmacies, retail stores, or online.  Drink -1 cup (120-240 mL) of oral rehydration solution each time you have an episode of diarrhea. If drinking this amount makes your diarrhea worse, try drinking smaller amounts more often. For example, drink 1-3 tsp (5-15 mL) every 5-10 minutes.  A general rule for staying hydrated is to drink 1-2 L of fluid per day. Talk to your health care provider about the specific amount you should be drinking each day. Drink enough fluids to keep your urine clear or pale yellow. Document Released: 05/23/2003 Document Revised: 03/07/2013 Document Reviewed: 01/23/2013 [facility] Patient Information 2015 [place], [place]. This information is not intended to replace advice given to you by your health care provider. Make sure you discuss any questions you have with your health care provider.

## 2013-12-24 ENCOUNTER — Encounter: Payer: Self-pay | Admitting: Family Medicine

## 2013-12-24 DIAGNOSIS — F329 Major depressive disorder, single episode, unspecified: Secondary | ICD-10-CM | POA: Insufficient documentation

## 2013-12-24 DIAGNOSIS — N289 Disorder of kidney and ureter, unspecified: Secondary | ICD-10-CM

## 2013-12-24 DIAGNOSIS — F32A Depression, unspecified: Secondary | ICD-10-CM

## 2013-12-24 HISTORY — DX: Depression, unspecified: F32.A

## 2013-12-24 HISTORY — DX: Disorder of kidney and ureter, unspecified: N28.9

## 2013-12-24 NOTE — Assessment & Plan Note (Signed)
Just recovering from a bad bout of diarrhea and nausea. Is eating better today but has not been eating much. Was having abdominal pain the last 2 weeks but improved today. Encouraged increased clear fluids and bland diet as tolerated

## 2013-12-24 NOTE — Assessment & Plan Note (Signed)
Stop Lasix and Quinapril, decrease Triamterene/hctz to 1/2 tab daily and recheck renal and cbc on 10/12. Son aware to up her fluids and seek care if worsens.

## 2013-12-24 NOTE — Assessment & Plan Note (Signed)
Will start Citalopram 10 mg daily when feeling better.

## 2013-12-24 NOTE — Progress Notes (Signed)
Patient ID: Destiny [name], female   DOB: 04-Sep-1931, 80 y.o.   MRN: [id number] NEHEMIE NOREEN [id number] June 30, 1931 12/24/2013      Progress Note-Follow Up  Subjective  Chief Complaint  Chief Complaint  Patient presents with  . Follow-up    not feeling well "Blah", laying in bed X 1 week, not eating a whole lot, has fallen 3 times this week    HPI  Patient is a 80 year old female in today for routine medical care. Patient has been feeling poorly for 2 weeks. Struggling with frequent is slowly improving. She did have some today she was in each. She's had no bloody or tarry stool. No fevers or chills but she does weakness and malaise and myalgias. She has had a couple of falls  over the last week. No anuria but in fact some urinary frequency. Denies CP/palp/SOB/HA/congestion/fevers c/o. Taking meds as prescribed  Past Medical History  Diagnosis Date  . PVD (peripheral vascular disease)   . CAD (coronary artery disease)     a. s/p CABG x 4 (LIMA->LAD, VG->Diag, VG->OM1->OM3);  b. 2010 Cath: 3/4 patent grafts (VG->diag occluded), 3vd, sev PVD ->Med Rx;  c. 12/2011 Lexi MV: sm area of mild mid and apical ant wall ischemia ->med rx.  . Hypertension   . Hyperlipidemia   . Duodenal ulcer 2009 and 11/2011  . Iron deficiency anemia secondary to blood loss (chronic)   . Amaurosis fugax   . Carotid bruit   . Diabetes mellitus type II   . Dementia     pt denies  . Diabetic retinopathy   . Gallstones 2009    seen on CT scan  . Diverticulosis   . Atherosclerosis   . Renal insufficiency   . CVA (cerebral infarction)   . Diabetic peripheral neuropathy   . GI bleed 04/2011, 11/2011    Cause not defined.   . Colon polyp   . Arthritis 12/06/2012  . Hypersomnolence 03/12/2013  . Right foot pain 04/10/2013  . Acute renal insufficiency 12/24/2013  . Depression 12/24/2013    Past Surgical History  Procedure Laterality Date  . Coronary artery bypass graft  1999    x 4  . Abdominal  hysterectomy    . Upper gastrointestinal endoscopy  02/22/2008    w/biopsy, duedenal ulcer, antrum erosions  . Esophagogastroduodenoscopy  05/16/2011    Procedure: ESOPHAGOGASTRODUODENOSCOPY (EGD);  Surgeon: Gatha Mayer, MD;  Location: [facility] ENDOSCOPY;  Service: Endoscopy;  Laterality: N/A;  . Colonoscopy  05/20/2011    Procedure: COLONOSCOPY;  Surgeon: Scarlette Shorts, MD;  Location: [facility];  Service: Endoscopy;  Laterality: N/A;  . Esophagogastroduodenoscopy  12/08/2011    Procedure: ESOPHAGOGASTRODUODENOSCOPY (EGD);  Surgeon: Ladene Artist, MD,FACG;  Location: [facility] ENDOSCOPY;  Service: Endoscopy;  Laterality: N/A;    Family History  Problem Relation Age of Onset  . Coronary artery disease Father   . Hypertension Father   . Stroke Father   . Cancer Mother     ?  . Diabetes Maternal Uncle   . Colon cancer Neg Hx     History   Social History  . Marital Status: Widowed    Spouse Name: N/A    Number of Children: 4  . Years of Education: N/A   Occupational History  . retired    Social History Main Topics  . Smoking status: Former Smoker    Types: Cigarettes    Quit date: 12/31/2000  . Smokeless tobacco: Never Used  . Alcohol  Use: No  . Drug Use: No  . Sexual Activity: Not on file   Other Topics Concern  . Not on file   Social History Narrative   Widowed - husband passed away in 05-20-2009   lives with daughter with epilepsy - mental retardation   Retired     Former Smoker      Alcohol use-no          Occupation: Retired       son - Keyondra Welch     Current Outpatient Prescriptions on File Prior to Visit  Medication Sig Dispense Refill  . amLODipine (NORVASC) 10 MG tablet TAKE 1 TABLET BY MOUTH EVERY DAY  30 tablet  2  . aspirin 81 MG tablet Take 1 tablet (81 mg total) by mouth daily.  30 tablet    . Cholecalciferol (VITAMIN D) 1000 UNITS capsule Take 1,000 Units by mouth every evening.       . clopidogrel (PLAVIX) 75 MG tablet TAKE 1 TABLET BY MOUTH DAILY.  90 tablet   0  . donepezil (ARICEPT) 10 MG tablet Take 1 tablet (10 mg total) by mouth at bedtime as needed.  30 tablet  5  . Ferrous Sulfate (IRON) 325 (65 FE) MG TABS Take 325 mg by mouth 2 (two) times daily.       . Glucosamine-Chondroit-Vit C-Mn (GLUCOSAMINE CHONDR 500 COMPLEX) CAPS Take 1 capsule by mouth daily.       . insulin glargine (LANTUS) 100 UNIT/ML injection Inject 0.35 mLs (35 Units total) into the skin at bedtime.  10 mL  4  . insulin lispro (HUMALOG KWIKPEN) 100 UNIT/ML KiwkPen Inject 0.05-0.1 mLs (5-10 Units total) into the skin 3 (three) times daily with meals.  15 mL  4  . isosorbide mononitrate (IMDUR) 60 MG 24 hr tablet TAKE 1 TABLET BY MOUTH EVERY DAY  90 tablet  0  . NITROSTAT 0.4 MG SL tablet PLACE 1 TABLET UNDER THE TONGUE EVERY 5 MIN PRN  25 tablet  0  . Omega-3 Fatty Acids (FISH OIL) 1200 MG CAPS Take 1,200 mg by mouth daily.       . pantoprazole (PROTONIX) 40 MG tablet Take 1 tablet (40 mg total) by mouth daily.  30 tablet  0  . vitamin B-12 (CYANOCOBALAMIN) 1000 MCG tablet Take 1,000 mcg by mouth daily.      . WELCHOL 625 MG tablet TAKE 1 OR 2 TABLETS BY MOUTH TWICE A DAY WITH FOOD  360 tablet  0   No current facility-administered medications on file prior to visit.    No Known Allergies  Review of Systems  Review of Systems  Constitutional: Positive for malaise/fatigue. Negative for fever and chills.  HENT: Negative for congestion.   Eyes: Negative for discharge.  Respiratory: Negative for shortness of breath.   Cardiovascular: Negative for chest pain, palpitations and leg swelling.  Gastrointestinal: Positive for diarrhea. Negative for heartburn, vomiting, abdominal pain, constipation, blood in stool and melena.  Genitourinary: Negative for dysuria.  Musculoskeletal: Positive for falls and myalgias. Negative for back pain.  Skin: Negative for rash.  Neurological: Positive for loss of consciousness. Negative for headaches.  Endo/Heme/Allergies: Negative for  polydipsia.  Psychiatric/Behavioral: Negative for depression and suicidal ideas. The patient is not nervous/anxious and does not have insomnia.     Objective  BP 130/39  Pulse 59  Temp(Src) 97.7 F (36.5 C) (Oral)  Ht 5\' 6"  (1.676 m)  Wt 154 lb 12.8 oz (70.217 kg)  BMI 25.00  kg/m2  SpO2 100%  Physical Exam  Physical Exam  Constitutional: She is oriented to person, place, and time and well-developed, well-nourished, and in no distress. No distress.  HENT:  Head: Normocephalic and atraumatic.  Eyes: Conjunctivae are normal.  Neck: Neck supple. No thyromegaly present.  Cardiovascular: Normal rate, regular rhythm and normal heart sounds.   No murmur heard. Pulmonary/Chest: Effort normal and breath sounds normal. She has no wheezes.  Abdominal: She exhibits no distension and no mass.  Musculoskeletal: She exhibits no edema.  Lymphadenopathy:    She has no cervical adenopathy.  Neurological: She is alert and oriented to person, place, and time.  Skin: Skin is warm and dry. No rash noted. She is not diaphoretic.  Psychiatric: Memory, affect and judgment normal.    Lab Results  Component Value Date   TSH 1.51 12/22/2013   Lab Results  Component Value Date   WBC 12.2* 12/22/2013   HGB 11.1* 12/22/2013   HCT 32.0* 12/22/2013   MCV 91.5 12/22/2013   PLT 342.0 12/22/2013   Lab Results  Component Value Date   CREATININE 1.9* 12/22/2013   BUN 88* 12/22/2013   NA 132* 12/22/2013   K 5.2* 12/22/2013   CL 107 12/22/2013   CO2 13* 12/22/2013   Lab Results  Component Value Date   ALT 14 12/22/2013   AST 20 12/22/2013   ALKPHOS 64 12/22/2013   BILITOT 0.3 12/22/2013   Lab Results  Component Value Date   CHOL 176 12/22/2013   Lab Results  Component Value Date   HDL 40.60 12/22/2013   Lab Results  Component Value Date   LDLCALC 73 09/13/2013   Lab Results  Component Value Date   TRIG 292.0* 12/22/2013   Lab Results  Component Value Date   CHOLHDL 4 12/22/2013     Assessment &  Plan  Essential hypertension Well controlled, no changes to meds. Encouraged heart healthy diet such as the DASH diet and exercise as tolerated.   UTI (urinary tract infection) Elevated WBC in serum and urinalysis suggestive of UTI started on Omnicef and await urine culture. Spoke with patient's son he will start her on meds and observe her if there are any changes then he will take her in to get her looked at.   Viral gastroenteritis Just recovering from a bad bout of diarrhea and nausea. Is eating better today but has not been eating much. Was having abdominal pain the last 2 weeks but improved today. Encouraged increased clear fluids and bland diet as tolerated  Acute renal insufficiency Stop Lasix and Quinapril, decrease Triamterene/hctz to 1/2 tab daily and recheck renal and cbc on 10/12. Son aware to up her fluids and seek care if worsens.  Diabetes mellitus type 2, insulin dependent Increase Lantus to 40 units and then can continue to increase by 2 units every 3 days if numbers remain hi, can also decrease doses if sugars start to decrease as she improves.   HYPERLIPIDEMIA Concerned about cost of Crestor, will d/c and given rx on Lovastatin 20 mg daily to start when feeling better.   Depression Will start Citalopram 10 mg daily when feeling better.

## 2013-12-24 NOTE — Assessment & Plan Note (Signed)
Concerned about cost of Crestor, will d/c and given rx on Lovastatin 20 mg daily to start when feeling better.

## 2013-12-24 NOTE — Assessment & Plan Note (Signed)
Elevated WBC in serum and urinalysis suggestive of UTI started on Omnicef and await urine culture. Spoke with patient's son he will start her on meds and observe her if there are any changes then he will take her in to get her looked at.

## 2013-12-24 NOTE — Assessment & Plan Note (Signed)
Increase Lantus to 40 units and then can continue to increase by 2 units every 3 days if numbers remain hi, can also decrease doses if sugars start to decrease as she improves.

## 2013-12-24 NOTE — Assessment & Plan Note (Signed)
Well controlled, no changes to meds. Encouraged heart healthy diet such as the DASH diet and exercise as tolerated.  °

## 2013-12-25 ENCOUNTER — Telehealth: Payer: Self-pay | Admitting: Family Medicine

## 2013-12-25 ENCOUNTER — Other Ambulatory Visit (INDEPENDENT_AMBULATORY_CARE_PROVIDER_SITE_OTHER): Payer: Medicare Other

## 2013-12-25 DIAGNOSIS — R109 Unspecified abdominal pain: Secondary | ICD-10-CM

## 2013-12-25 DIAGNOSIS — R197 Diarrhea, unspecified: Secondary | ICD-10-CM

## 2013-12-25 LAB — RENAL FUNCTION PANEL
ALBUMIN: 3.4 g/dL — AB (ref 3.5–5.2)
BUN: 78 mg/dL — ABNORMAL HIGH (ref 6–23)
CHLORIDE: 102 meq/L (ref 96–112)
CO2: 20 mEq/L (ref 19–32)
Calcium: 9.6 mg/dL (ref 8.4–10.5)
Creatinine, Ser: 2 mg/dL — ABNORMAL HIGH (ref 0.4–1.2)
GFR: 25.44 mL/min — ABNORMAL LOW (ref >60.00)
Glucose, Bld: 167 mg/dL — ABNORMAL HIGH (ref 70–99)
PHOSPHORUS: 4 mg/dL (ref 2.3–4.6)
Potassium: 4.5 mEq/L (ref 3.5–5.1)
SODIUM: 133 meq/L — AB (ref 135–145)

## 2013-12-25 LAB — CBC
HEMATOCRIT: 31.6 % — AB (ref 36.0–46.0)
Hemoglobin: 10.6 g/dL — ABNORMAL LOW (ref 12.0–15.0)
MCHC: 33.5 g/dL (ref 30.0–36.0)
MCV: 91.1 fl (ref 78.0–100.0)
Platelets: 295 10*3/uL (ref 150.0–400.0)
RBC: 3.47 Mil/uL — AB (ref 3.87–5.11)
RDW: 13.6 % (ref 11.5–15.5)
WBC: 7 10*3/uL (ref 4.0–10.5)

## 2013-12-25 NOTE — Addendum Note (Signed)
Addended by: Varney Daily on: 12/25/2013 11:05 AM   Modules accepted: Orders

## 2013-12-26 NOTE — Telephone Encounter (Signed)
Opened in error

## 2013-12-27 ENCOUNTER — Telehealth: Payer: Self-pay | Admitting: *Deleted

## 2013-12-27 NOTE — Telephone Encounter (Signed)
Destiny [name] called and stated [name] called pt and gave lab results.  Pt does not remember what they were.  Could you please call Destiny [name] and discuss?

## 2013-12-28 ENCOUNTER — Encounter (HOSPITAL_COMMUNITY): Payer: Self-pay | Admitting: Emergency Medicine

## 2013-12-28 ENCOUNTER — Inpatient Hospital Stay (HOSPITAL_COMMUNITY)
Admission: EM | Admit: 2013-12-28 | Discharge: 2013-12-30 | DRG: 378 | Disposition: A | Discharge status: Home / Self Care | Payer: Medicare Other | Attending: Internal Medicine | Admitting: Internal Medicine

## 2013-12-28 DIAGNOSIS — Z6825 Body mass index (BMI) 25.0-25.9, adult: Secondary | ICD-10-CM | POA: Diagnosis not present

## 2013-12-28 DIAGNOSIS — N390 Urinary tract infection, site not specified: Secondary | ICD-10-CM | POA: Diagnosis present

## 2013-12-28 DIAGNOSIS — I129 Hypertensive chronic kidney disease with stage 1 through stage 4 chronic kidney disease, or unspecified chronic kidney disease: Secondary | ICD-10-CM | POA: Diagnosis present

## 2013-12-28 DIAGNOSIS — Z7902 Long term (current) use of antithrombotics/antiplatelets: Secondary | ICD-10-CM

## 2013-12-28 DIAGNOSIS — F0390 Unspecified dementia without behavioral disturbance: Secondary | ICD-10-CM | POA: Diagnosis present

## 2013-12-28 DIAGNOSIS — I739 Peripheral vascular disease, unspecified: Secondary | ICD-10-CM | POA: Diagnosis present

## 2013-12-28 DIAGNOSIS — E1142 Type 2 diabetes mellitus with diabetic polyneuropathy: Secondary | ICD-10-CM | POA: Diagnosis present

## 2013-12-28 DIAGNOSIS — I25810 Atherosclerosis of coronary artery bypass graft(s) without angina pectoris: Secondary | ICD-10-CM | POA: Diagnosis present

## 2013-12-28 DIAGNOSIS — E785 Hyperlipidemia, unspecified: Secondary | ICD-10-CM | POA: Diagnosis present

## 2013-12-28 DIAGNOSIS — I2510 Atherosclerotic heart disease of native coronary artery without angina pectoris: Secondary | ICD-10-CM | POA: Diagnosis present

## 2013-12-28 DIAGNOSIS — K922 Gastrointestinal hemorrhage, unspecified: Secondary | ICD-10-CM | POA: Diagnosis present

## 2013-12-28 DIAGNOSIS — E11319 Type 2 diabetes mellitus with unspecified diabetic retinopathy without macular edema: Secondary | ICD-10-CM | POA: Diagnosis present

## 2013-12-28 DIAGNOSIS — N184 Chronic kidney disease, stage 4 (severe): Secondary | ICD-10-CM | POA: Diagnosis present

## 2013-12-28 DIAGNOSIS — Z794 Long term (current) use of insulin: Secondary | ICD-10-CM | POA: Diagnosis not present

## 2013-12-28 DIAGNOSIS — D62 Acute posthemorrhagic anemia: Secondary | ICD-10-CM | POA: Diagnosis present

## 2013-12-28 DIAGNOSIS — M1990 Unspecified osteoarthritis, unspecified site: Secondary | ICD-10-CM | POA: Diagnosis present

## 2013-12-28 DIAGNOSIS — F329 Major depressive disorder, single episode, unspecified: Secondary | ICD-10-CM | POA: Diagnosis present

## 2013-12-28 DIAGNOSIS — I5032 Chronic diastolic (congestive) heart failure: Secondary | ICD-10-CM | POA: Diagnosis present

## 2013-12-28 DIAGNOSIS — E1165 Type 2 diabetes mellitus with hyperglycemia: Secondary | ICD-10-CM | POA: Diagnosis present

## 2013-12-28 DIAGNOSIS — E119 Type 2 diabetes mellitus without complications: Secondary | ICD-10-CM

## 2013-12-28 DIAGNOSIS — E1122 Type 2 diabetes mellitus with diabetic chronic kidney disease: Secondary | ICD-10-CM | POA: Diagnosis present

## 2013-12-28 DIAGNOSIS — IMO0002 Reserved for concepts with insufficient information to code with codable children: Secondary | ICD-10-CM

## 2013-12-28 DIAGNOSIS — K264 Chronic or unspecified duodenal ulcer with hemorrhage: Secondary | ICD-10-CM

## 2013-12-28 DIAGNOSIS — K921 Melena: Secondary | ICD-10-CM | POA: Diagnosis present

## 2013-12-28 DIAGNOSIS — R319 Hematuria, unspecified: Secondary | ICD-10-CM | POA: Diagnosis present

## 2013-12-28 DIAGNOSIS — Z8711 Personal history of peptic ulcer disease: Secondary | ICD-10-CM | POA: Diagnosis not present

## 2013-12-28 DIAGNOSIS — D649 Anemia, unspecified: Secondary | ICD-10-CM

## 2013-12-28 DIAGNOSIS — Z8673 Personal history of transient ischemic attack (TIA), and cerebral infarction without residual deficits: Secondary | ICD-10-CM

## 2013-12-28 DIAGNOSIS — Z86010 Personal history of colonic polyps: Secondary | ICD-10-CM

## 2013-12-28 DIAGNOSIS — E663 Overweight: Secondary | ICD-10-CM | POA: Diagnosis present

## 2013-12-28 DIAGNOSIS — I10 Essential (primary) hypertension: Secondary | ICD-10-CM | POA: Diagnosis present

## 2013-12-28 DIAGNOSIS — Z823 Family history of stroke: Secondary | ICD-10-CM | POA: Diagnosis not present

## 2013-12-28 DIAGNOSIS — E1129 Type 2 diabetes mellitus with other diabetic kidney complication: Secondary | ICD-10-CM

## 2013-12-28 DIAGNOSIS — Z66 Do not resuscitate: Secondary | ICD-10-CM | POA: Diagnosis present

## 2013-12-28 DIAGNOSIS — R001 Bradycardia, unspecified: Secondary | ICD-10-CM | POA: Diagnosis present

## 2013-12-28 DIAGNOSIS — Z951 Presence of aortocoronary bypass graft: Secondary | ICD-10-CM

## 2013-12-28 DIAGNOSIS — Z87891 Personal history of nicotine dependence: Secondary | ICD-10-CM | POA: Diagnosis not present

## 2013-12-28 DIAGNOSIS — Z7982 Long term (current) use of aspirin: Secondary | ICD-10-CM

## 2013-12-28 DIAGNOSIS — E538 Deficiency of other specified B group vitamins: Secondary | ICD-10-CM

## 2013-12-28 HISTORY — DX: Chronic kidney disease, stage 4 (severe): N18.4

## 2013-12-28 HISTORY — DX: Melena: K92.1

## 2013-12-28 LAB — URINALYSIS, ROUTINE W REFLEX MICROSCOPIC
Bilirubin Urine: NEGATIVE
Glucose, UA: NEGATIVE mg/dL
Hgb urine dipstick: NEGATIVE
Ketones, ur: NEGATIVE mg/dL
NITRITE: NEGATIVE
PH: 5 (ref 5.0–8.0)
Protein, ur: NEGATIVE mg/dL
SPECIFIC GRAVITY, URINE: 1.017 (ref 1.005–1.030)
Urobilinogen, UA: 0.2 mg/dL (ref 0.0–1.0)

## 2013-12-28 LAB — COMPREHENSIVE METABOLIC PANEL
ALT: 11 U/L (ref 0–35)
ANION GAP: 14 (ref 5–15)
AST: 13 U/L (ref 0–37)
Albumin: 3 g/dL — ABNORMAL LOW (ref 3.5–5.2)
Alkaline Phosphatase: 52 U/L (ref 39–117)
BUN: 79 mg/dL — AB (ref 6–23)
CALCIUM: 9.1 mg/dL (ref 8.4–10.5)
CO2: 20 meq/L (ref 19–32)
CREATININE: 1.61 mg/dL — AB (ref 0.50–1.10)
Chloride: 106 mEq/L (ref 96–112)
GFR, EST AFRICAN AMERICAN: 34 mL/min — AB (ref >90)
GFR, EST NON AFRICAN AMERICAN: 29 mL/min — AB (ref >90)
GLUCOSE: 221 mg/dL — AB (ref 70–99)
Potassium: 4.9 mEq/L (ref 3.7–5.3)
SODIUM: 140 meq/L (ref 137–147)
TOTAL PROTEIN: 6.3 g/dL (ref 6.0–8.3)
Total Bilirubin: 0.3 mg/dL (ref 0.3–1.2)

## 2013-12-28 LAB — APTT: APTT: 31 s (ref 24–37)

## 2013-12-28 LAB — GLUCOSE, CAPILLARY
GLUCOSE-CAPILLARY: 167 mg/dL — AB (ref 70–99)
GLUCOSE-CAPILLARY: 141 mg/dL — AB (ref 70–99)
Glucose-Capillary: 131 mg/dL — ABNORMAL HIGH (ref 70–99)
Glucose-Capillary: 117 mg/dL — ABNORMAL HIGH (ref 70–99)

## 2013-12-28 LAB — PREPARE RBC (CROSSMATCH)

## 2013-12-28 LAB — CBC WITH DIFFERENTIAL/PLATELET
Basophils Absolute: 0.1 10*3/uL (ref 0.0–0.1)
Basophils Relative: 0 % (ref 0–1)
EOS ABS: 0.3 10*3/uL (ref 0.0–0.7)
EOS PCT: 2 % (ref 0–5)
HEMATOCRIT: 23.1 % — AB (ref 36.0–46.0)
Hemoglobin: 7.9 g/dL — ABNORMAL LOW (ref 12.0–15.0)
LYMPHS ABS: 2.4 10*3/uL (ref 0.7–4.0)
Lymphocytes Relative: 22 % (ref 12–46)
MCH: 30.4 pg (ref 26.0–34.0)
MCHC: 34.2 g/dL (ref 30.0–36.0)
MCV: 88.8 fL (ref 78.0–100.0)
MONO ABS: 0.6 10*3/uL (ref 0.1–1.0)
MONOS PCT: 6 % (ref 3–12)
Neutro Abs: 7.8 10*3/uL — ABNORMAL HIGH (ref 1.7–7.7)
Neutrophils Relative %: 70 % (ref 43–77)
PLATELETS: 242 10*3/uL (ref 150–400)
RBC: 2.6 MIL/uL — AB (ref 3.87–5.11)
RDW: 13.6 % (ref 11.5–15.5)
WBC: 11.2 10*3/uL — ABNORMAL HIGH (ref 4.0–10.5)

## 2013-12-28 LAB — I-STAT CG4 LACTIC ACID, ED: LACTIC ACID, VENOUS: 2.62 mmol/L — AB (ref 0.5–2.2)

## 2013-12-28 LAB — URINE MICROSCOPIC-ADD ON

## 2013-12-28 LAB — PROTIME-INR
INR: 1.27 (ref 0.00–1.49)
PROTHROMBIN TIME: 16 s — AB (ref 11.6–15.2)

## 2013-12-28 LAB — PRO B NATRIURETIC PEPTIDE: Pro B Natriuretic peptide (BNP): 241.5 pg/mL (ref 0–450)

## 2013-12-28 LAB — HEMOGLOBIN AND HEMATOCRIT, BLOOD
HEMATOCRIT: 20.9 % — AB (ref 36.0–46.0)
Hemoglobin: 7.4 g/dL — ABNORMAL LOW (ref 12.0–15.0)

## 2013-12-28 LAB — I-STAT TROPONIN, ED: Troponin i, poc: 0.02 ng/mL (ref 0.00–0.08)

## 2013-12-28 LAB — POC OCCULT BLOOD, ED: Fecal Occult Bld: POSITIVE — AB

## 2013-12-28 LAB — SAMPLE TO BLOOD BANK

## 2013-12-28 LAB — MAGNESIUM: Magnesium: 2 mg/dL (ref 1.5–2.5)

## 2013-12-28 MED ORDER — ONDANSETRON HCL 4 MG PO TABS: 4.0000 mg | ORAL_TABLET | Freq: Four times a day (QID) | ORAL | Status: DC | PRN

## 2013-12-28 MED ORDER — PANTOPRAZOLE SODIUM 40 MG IV SOLR
40.0000 mg | INTRAVENOUS | Status: DC
  Administered 2013-12-28 – 2013-12-29 (×2): 40 mg via INTRAVENOUS
  Filled 2013-12-28 (×3): qty 40

## 2013-12-28 MED ORDER — INSULIN ASPART 100 UNIT/ML ~~LOC~~ SOLN
0.0000 [IU] | SUBCUTANEOUS | Status: DC
  Administered 2013-12-28: 2 [IU] via SUBCUTANEOUS
  Administered 2013-12-28: 1 [IU] via SUBCUTANEOUS
  Administered 2013-12-29 – 2013-12-30 (×4): 2 [IU] via SUBCUTANEOUS

## 2013-12-28 MED ORDER — SODIUM CHLORIDE 0.9 % IV SOLN
10.0000 mL/h | Freq: Once | INTRAVENOUS | Status: AC
  Administered 2013-12-28: 10 mL/h via INTRAVENOUS

## 2013-12-28 MED ORDER — PANTOPRAZOLE SODIUM 40 MG IV SOLR
40.0000 mg | Freq: Once | INTRAVENOUS | Status: AC
  Administered 2013-12-28: 40 mg via INTRAVENOUS
  Filled 2013-12-28: qty 40

## 2013-12-28 MED ORDER — SODIUM CHLORIDE 0.9 % IV SOLN: Freq: Once | INTRAVENOUS | Status: DC

## 2013-12-28 MED ORDER — SODIUM CHLORIDE 0.9 % IV SOLN
INTRAVENOUS | Status: AC
  Administered 2013-12-28: 1000 mL via INTRAVENOUS

## 2013-12-28 MED ORDER — SODIUM CHLORIDE 0.9 % IV SOLN
1000.0000 mL | INTRAVENOUS | Status: DC
  Administered 2013-12-28: 1000 mL via INTRAVENOUS

## 2013-12-28 MED ORDER — INSULIN GLARGINE 100 UNIT/ML ~~LOC~~ SOLN
5.0000 [IU] | Freq: Every day | SUBCUTANEOUS | Status: DC
  Administered 2013-12-28 – 2013-12-29 (×2): 5 [IU] via SUBCUTANEOUS
  Filled 2013-12-28 (×3): qty 0.05

## 2013-12-28 MED ORDER — INFLUENZA VAC SPLIT QUAD 0.5 ML IM SUSY
0.5000 mL | PREFILLED_SYRINGE | INTRAMUSCULAR | Status: AC
  Administered 2013-12-29: 0.5 mL via INTRAMUSCULAR
  Filled 2013-12-28: qty 0.5

## 2013-12-28 MED ORDER — SODIUM CHLORIDE 0.9 % IV SOLN
1000.0000 mL | INTRAVENOUS | Status: DC
  Administered 2013-12-29: 1000 mL via INTRAVENOUS

## 2013-12-28 MED ORDER — SODIUM CHLORIDE 0.9 % IV SOLN
1000.0000 mL | Freq: Once | INTRAVENOUS | Status: AC
  Administered 2013-12-28: 1000 mL via INTRAVENOUS

## 2013-12-28 MED ORDER — ONDANSETRON HCL 4 MG/2ML IJ SOLN: 4.0000 mg | Freq: Four times a day (QID) | INTRAMUSCULAR | Status: DC | PRN

## 2013-12-28 MED ORDER — SODIUM CHLORIDE 0.9 % IJ SOLN
3.0000 mL | Freq: Two times a day (BID) | INTRAMUSCULAR | Status: DC
  Administered 2013-12-28 – 2013-12-30 (×2): 3 mL via INTRAVENOUS

## 2013-12-28 NOTE — Telephone Encounter (Signed)
So the only phone number I can find in the chart is her home phone, I need the son's phone number in the chart if I need to call him back. I am actually able to find it because I called him back from my cell phone last week but it needs to be collected when he calls and loaded in the chart if he is expecting a call back

## 2013-12-28 NOTE — ED Provider Notes (Signed)
CSN: [id number]     Arrival date & time 12/28/13  [id number] History   First MD Initiated Contact with Patient 12/28/13 0557     Chief Complaint  Patient presents with  . Melena     (Consider location/radiation/quality/duration/timing/severity/associated sxs/prior Treatment) HPI Destiny [name] is a 80 y.o. female with hx of htn, DM, CAD, CVA, prior GI bleed, who presents to ED with complaint of black stools onset this morning. According to pt's daughter who helps to care for her, pt has had black stools intermittently for the last two weeks. Today pt had stool incontinence, states "it dripped all over the floor and was black and bright red in the toilet."  Pt states she has had some abdominal pain 2 weeks ago, none today. Denies nausea, vomiting. Pt's family also state pt has been feeling "bad" for the last two weeks. She has had numeours trips to PCP with blood work done twice in the last week and was diagnosed with a UTI. Pt just finished antibiotics for that. Pt denies any pain or distress at this time. States "I just feel weak and dizzy." Pt is on plavix and aspirin 81mg  for CAD.   Past Medical History  Diagnosis Date  . PVD (peripheral vascular disease)   . CAD (coronary artery disease)     a. s/p CABG x 4 (LIMA->LAD, VG->Diag, VG->OM1->OM3);  b. 2010 Cath: 3/4 patent grafts (VG->diag occluded), 3vd, sev PVD ->Med Rx;  c. 12/2011 Lexi MV: sm area of mild mid and apical ant wall ischemia ->med rx.  . Hypertension   . Hyperlipidemia   . Duodenal ulcer 2009 and 11/2011  . Iron deficiency anemia secondary to blood loss (chronic)   . Amaurosis fugax   . Carotid bruit   . Diabetes mellitus type II   . Dementia     pt denies  . Diabetic retinopathy   . Gallstones 2009    seen on CT scan  . Diverticulosis   . Atherosclerosis   . Renal insufficiency   . CVA (cerebral infarction)   . Diabetic peripheral neuropathy   . GI bleed 04/2011, 11/2011    Cause not defined.   . Colon polyp   .  Arthritis 12/06/2012  . Hypersomnolence 03/12/2013  . Right foot pain 04/10/2013  . Acute renal insufficiency 12/24/2013  . Depression 12/24/2013   Past Surgical History  Procedure Laterality Date  . Coronary artery bypass graft  1999    x 4  . Abdominal hysterectomy    . Upper gastrointestinal endoscopy  02/22/2008    w/biopsy, duedenal ulcer, antrum erosions  . Esophagogastroduodenoscopy  05/16/2011    Procedure: ESOPHAGOGASTRODUODENOSCOPY (EGD);  Surgeon: Gatha Mayer, MD;  Location: [facility] ENDOSCOPY;  Service: Endoscopy;  Laterality: N/A;  . Colonoscopy  05/20/2011    Procedure: COLONOSCOPY;  Surgeon: Scarlette Shorts, MD;  Location: [place];  Service: Endoscopy;  Laterality: N/A;  . Esophagogastroduodenoscopy  12/08/2011    Procedure: ESOPHAGOGASTRODUODENOSCOPY (EGD);  Surgeon: Ladene Artist, MD,FACG;  Location: [facility] ENDOSCOPY;  Service: Endoscopy;  Laterality: N/A;   Family History  Problem Relation Age of Onset  . Coronary artery disease Father   . Hypertension Father   . Stroke Father   . Cancer Mother     ?  . Diabetes Maternal Uncle   . Colon cancer Neg Hx    History  Substance Use Topics  . Smoking status: Former Smoker    Types: Cigarettes    Quit date: 12/31/2000  . Smokeless  tobacco: Never Used  . Alcohol Use: No   OB History   Grav Para Term Preterm Abortions TAB SAB Ect Mult Living                 Review of Systems  Constitutional: Positive for appetite change and fatigue. Negative for fever and chills.  Respiratory: Negative for cough, chest tightness and shortness of breath.   Cardiovascular: Negative for chest pain, palpitations and leg swelling.  Gastrointestinal: Positive for diarrhea and blood in stool. Negative for nausea, vomiting, abdominal pain and abdominal distention.  Genitourinary: Negative for dysuria and flank pain.  Musculoskeletal: Negative for arthralgias, myalgias, neck pain and neck stiffness.  Skin: Negative for rash.  Neurological:  Positive for tremors and light-headedness. Negative for dizziness, weakness and headaches.  All other systems reviewed and are negative.     Allergies  Review of patient's allergies indicates no known allergies.  Home Medications   Prior to Admission medications   Medication Sig Start Date End Date Taking? Authorizing Provider  amLODipine (NORVASC) 10 MG tablet TAKE 1 TABLET BY MOUTH EVERY DAY 10/26/13   Mosie Lukes, MD  aspirin 81 MG tablet Take 1 tablet (81 mg total) by mouth daily. 06/07/12   Rogelia Mire, NP  cefdinir (OMNICEF) 300 MG capsule Take 1 capsule (300 mg total) by mouth 2 (two) times daily. 12/22/13 01/01/14  Mosie Lukes, MD  Cholecalciferol (VITAMIN D) 1000 UNITS capsule Take 1,000 Units by mouth every evening.     Historical Provider, MD  citalopram (CELEXA) 10 MG tablet Take 1 tablet (10 mg total) by mouth daily. 12/22/13   Mosie Lukes, MD  clopidogrel (PLAVIX) 75 MG tablet TAKE 1 TABLET BY MOUTH DAILY. 11/02/13   Mosie Lukes, MD  donepezil (ARICEPT) 10 MG tablet Take 1 tablet (10 mg total) by mouth at bedtime as needed. 09/20/13   Mosie Lukes, MD  Ferrous Sulfate (IRON) 325 (65 FE) MG TABS Take 325 mg by mouth 2 (two) times daily.     Historical Provider, MD  furosemide (LASIX) 20 MG tablet Take 1 tablet (20 mg total) by mouth daily as needed for fluid or edema (weight gain>3# in 24 hours). 12/22/13   Mosie Lukes, MD  Glucosamine-Chondroit-Vit C-Mn (GLUCOSAMINE CHONDR 500 COMPLEX) CAPS Take 1 capsule by mouth daily.     Historical Provider, MD  insulin glargine (LANTUS) 100 UNIT/ML injection Inject 0.35 mLs (35 Units total) into the skin at bedtime. 12/18/13   Mosie Lukes, MD  insulin lispro (HUMALOG KWIKPEN) 100 UNIT/ML KiwkPen Inject 0.05-0.1 mLs (5-10 Units total) into the skin 3 (three) times daily with meals. 12/18/13   Mosie Lukes, MD  isosorbide mononitrate (IMDUR) 60 MG 24 hr tablet TAKE 1 TABLET BY MOUTH EVERY DAY 10/26/13   Mosie Lukes, MD   lovastatin (MEVACOR) 20 MG tablet Take 1 tablet (20 mg total) by mouth at bedtime. 12/22/13   Mosie Lukes, MD  NITROSTAT 0.4 MG SL tablet PLACE 1 TABLET UNDER THE TONGUE EVERY 5 MIN PRN 06/27/13   Mosie Lukes, MD  Omega-3 Fatty Acids (FISH OIL) 1200 MG CAPS Take 1,200 mg by mouth daily.     Historical Provider, MD  pantoprazole (PROTONIX) 40 MG tablet Take 1 tablet (40 mg total) by mouth daily. 12/14/13   Mosie Lukes, MD  triamterene-hydrochlorothiazide (MAXZIDE-25) 37.5-25 MG per tablet Take 0.5 tablets by mouth daily. 12/22/13   Mosie Lukes, MD  vitamin B-12 (  CYANOCOBALAMIN) 1000 MCG tablet Take 1,000 mcg by mouth daily.    Historical Provider, MD  WELCHOL 625 MG tablet TAKE 1 OR 2 TABLETS BY MOUTH TWICE A DAY WITH FOOD 11/22/13   Mosie Lukes, MD   BP 129/47  Pulse 65  Temp(Src) 97.5 F (36.4 C) (Oral)  Resp 16  Ht 5\' 6"  (1.676 m)  Wt 155 lb (70.308 kg)  BMI 25.03 kg/m2  SpO2 100% Physical Exam  Nursing note and vitals reviewed. Constitutional: She is oriented to person, place, and time. She appears well-developed and well-nourished. No distress.  HENT:  Head: Normocephalic.  Eyes: Conjunctivae are normal.  Neck: Neck supple.  Cardiovascular: Normal rate, regular rhythm and normal heart sounds.   Pulmonary/Chest: Effort normal and breath sounds normal. No respiratory distress. She has no wheezes. She has no rales.  Abdominal: Soft. Bowel sounds are normal. She exhibits no distension. There is tenderness. There is no rebound.  LLQ, RUQ tenderness. No guarding, no rebound tenderness  Musculoskeletal: She exhibits no edema.  Neurological: She is alert and oriented to person, place, and time.  Skin: Skin is warm and dry. There is pallor.  Psychiatric: She has a normal mood and affect. Her behavior is normal.    ED Course  Procedures (including critical care time) Labs Review Labs Reviewed  CBC WITH DIFFERENTIAL - Abnormal; Notable for the following:    WBC 11.2 (*)     RBC 2.60 (*)    Hemoglobin 7.9 (*)    HCT 23.1 (*)    Neutro Abs 7.8 (*)    All other components within normal limits  COMPREHENSIVE METABOLIC PANEL - Abnormal; Notable for the following:    Glucose, Bld 221 (*)    BUN 79 (*)    Creatinine, Ser 1.61 (*)    Albumin 3.0 (*)    GFR calc non Af Amer 29 (*)    GFR calc Af Amer 34 (*)    All other components within normal limits  URINALYSIS, ROUTINE W REFLEX MICROSCOPIC - Abnormal; Notable for the following:    Leukocytes, UA TRACE (*)    All other components within normal limits  PROTIME-INR - Abnormal; Notable for the following:    Prothrombin Time 16.0 (*)    All other components within normal limits  URINE MICROSCOPIC-ADD ON - Abnormal; Notable for the following:    Casts HYALINE CASTS (*)    All other components within normal limits  I-STAT CG4 LACTIC ACID, ED - Abnormal; Notable for the following:    Lactic Acid, Venous 2.62 (*)    All other components within normal limits  APTT  I-STAT TROPOININ, ED  POC OCCULT BLOOD, ED  SAMPLE TO BLOOD BANK  TYPE AND SCREEN  PREPARE RBC (CROSSMATCH)    Imaging Review No results found.   EKG Interpretation None      MDM   Final diagnoses:  Gastrointestinal hemorrhage with melena  Anemia, unspecified anemia type    Pt from home with melena. Pt weak and dizzy for about 1-2 weeks. No current abd pain. Abdomen soft, no acute abd, no concern for perforated bowel at this time. hgb drop to 7.9 from 10.6 3 days ago. Hx of gastric ulcers. Will admit to medicine. protonix ordered. Fluid bolus and blood transfusion ordered. Pt's vs are normal at this time.   Filed Vitals:   12/28/13 0534 12/28/13 0536  BP:  129/47  Pulse:  65  Temp: 97.5 F (36.4 C) 97.5 F (36.4 C)  TempSrc: Oral Oral  Resp: 20 16  Height:  5\' 6"  (1.676 m)  Weight:  155 lb (70.308 kg)  SpO2: 99% 100%     7:15 AM Blood transfusion ordered. Pt continues to be hemodynamically stable. Discussed with triad, will  admit.   Filed Vitals:   12/28/13 0700  BP: 124/43  Pulse: 57  Temp:   Resp: 18     Darius Lundberg A Mike Berntsen, PA-C 12/28/13 [phone]

## 2013-12-28 NOTE — Progress Notes (Signed)
12/28/13 Patient coming from Emergency room  Dx of Melena .Diet NPO,Going to get 2 units blood,

## 2013-12-28 NOTE — Consult Note (Signed)
[facility] Consult: 11:57 AM 12/28/2013  LOS: 0 days    Referring Provider: Dr Gevena Barre  Primary Care Physician:  Penni Homans, MD Primary Gastroenterologist:  Dr. Carlean Purl    Reason for Consultation:  Tarry stools and anemia.    HPI: Destiny [name] is a 80 y.o. female.  On chronic Plavix and 81 ASA (hx CVA and CAD/CABG but no stents)  . Has IDDM, mild dementia, CKD.     Hx of GI bleeding and iron deficiency anemia. Multiple endoscopic evaluations including:  2009 EGD for melena showed duodenal bulb ulcer felt secondary to NSAIDs. Clotest negative.  05/2011 EGD normal.  05/2011 Colonoscopy showed severe, pan diverticulosis.  06/2011 Capsule endoscopy normal.  11/2011 EGD by Dr Fuller Plan for ABL anemia and hematochezia/melena showed non-bleeding DU.   She takes po Iron bid, Protonix 40 mg daily, oral B12 daily in addition to multiple other medications.  No NSAIDs.    As of 9/30 visit to PMD, she was c/o 3 to 4 days of nausea and loose stools. Felt to be having viral gastroenteritis.  This was not described as melenic or bloody by pt but in retrospect, dtr says it was tarry.  Last night had another large volume bloody/tarry stool. These are different from the usual formed, dark stool that is normal as she is taking her iron.  There has been no nausea, no anorexia, nor abdominal pain but she does report all over abd tenderness to exam yesterday and today.  On 10/11 ROV she reported weakness, feeling "blah" and run down for 2 weeks and falls at home.  At that visit her PMD started [facility] and discontinued ACEI and Lasix as she had worsening renal insufficiency (BUN up from 45 in July, 51 on 9/30, to 88 on 10/9, however the Creatinine was headed in downward direction)  Admitted this morning, 12/28/13, with  tarry stools and increasing DOE, slight lightheadedness.   Stool is FOBT +.  No large hematomas, no blood in urine or from nose.   Hgb was 12.2 two weeks ago, 10.6 three days ago and it is 7.9 today. BUN is 79.    Past Medical History  Diagnosis Date  . PVD (peripheral vascular disease)   . CAD (coronary artery disease)     a. s/p CABG x 4 (LIMA->LAD, VG->Diag, VG->OM1->OM3);  b. 2010 Cath: 3/4 patent grafts (VG->diag occluded), 3vd, sev PVD ->Med Rx;  c. 12/2011 Lexi MV: sm area of mild mid and apical ant wall ischemia ->med rx.  . Hypertension   . Hyperlipidemia   . Duodenal ulcer 2009 and 11/2011  . Iron deficiency anemia secondary to blood loss (chronic)   . Amaurosis fugax   . Carotid bruit   . Diabetes mellitus type II   . Dementia     pt denies  . Diabetic retinopathy   . Gallstones 2009    seen on CT scan  . Diverticulosis   . Atherosclerosis   . Renal insufficiency   . CVA (cerebral infarction)   . Diabetic peripheral neuropathy   .  GI bleed 04/2011, 11/2011    Cause not defined.   . Colon polyp   . Arthritis 12/06/2012  . Hypersomnolence 03/12/2013  . Right foot pain 04/10/2013  . Acute renal insufficiency 12/24/2013  . Depression 12/24/2013  . Melena 1015/2015    PEPTIC ULCER    REQUIRING TRANSFUSION    Past Surgical History  Procedure Laterality Date  . Coronary artery bypass graft  1999    x 4  . Abdominal hysterectomy    . Upper gastrointestinal endoscopy  02/22/2008    w/biopsy, duedenal ulcer, antrum erosions  . Esophagogastroduodenoscopy  05/16/2011    Procedure: ESOPHAGOGASTRODUODENOSCOPY (EGD);  Surgeon: Gatha Mayer, MD;  Location: [facility] ENDOSCOPY;  Service: Endoscopy;  Laterality: N/A;  . Colonoscopy  05/20/2011    Procedure: COLONOSCOPY;  Surgeon: Scarlette Shorts, MD;  Location: [place];  Service: Endoscopy;  Laterality: N/A;  . Esophagogastroduodenoscopy  12/08/2011    Procedure: ESOPHAGOGASTRODUODENOSCOPY (EGD);  Surgeon: Ladene Artist, MD,FACG;   Location: [facility] ENDOSCOPY;  Service: Endoscopy;  Laterality: N/A;    Prior to Admission medications   Medication Sig Start Date End Date Taking? Authorizing Provider  amLODipine (NORVASC) 10 MG tablet Take 10 mg by mouth daily.   Yes Historical Provider, MD  aspirin 81 MG tablet Take 1 tablet (81 mg total) by mouth daily. 06/07/12  Yes Rogelia Mire, NP  cefdinir (OMNICEF) 300 MG capsule Take 1 capsule (300 mg total) by mouth 2 (two) times daily. 12/22/13 01/01/14 Yes Mosie Lukes, MD  Cholecalciferol (VITAMIN D) 1000 UNITS capsule Take 1,000 Units by mouth every evening.    Yes Historical Provider, MD  citalopram (CELEXA) 10 MG tablet Take 1 tablet (10 mg total) by mouth daily. 12/22/13  Yes Mosie Lukes, MD  clopidogrel (PLAVIX) 75 MG tablet Take 75 mg by mouth daily.   Yes Historical Provider, MD  colesevelam (WELCHOL) 625 MG tablet Take 625 mg by mouth 2 (two) times daily with a meal.   Yes Historical Provider, MD  donepezil (ARICEPT) 10 MG tablet Take 10 mg by mouth at bedtime.   Yes Historical Provider, MD  Ferrous Sulfate (IRON) 325 (65 FE) MG TABS Take 325 mg by mouth 2 (two) times daily.    Yes Historical Provider, MD  furosemide (LASIX) 20 MG tablet Take 20 mg by mouth daily.   Yes Historical Provider, MD  Glucosamine-Chondroit-Vit C-Mn (GLUCOSAMINE CHONDR 500 COMPLEX) CAPS Take 1 capsule by mouth daily.    Yes Historical Provider, MD  insulin glargine (LANTUS) 100 UNIT/ML injection Inject 40 Units into the skin at bedtime.   Yes Historical Provider, MD  insulin lispro (HUMALOG KWIKPEN) 100 UNIT/ML KiwkPen Inject 0.05-0.1 mLs (5-10 Units total) into the skin 3 (three) times daily with meals. 12/18/13  Yes Mosie Lukes, MD  isosorbide mononitrate (IMDUR) 60 MG 24 hr tablet Take 60 mg by mouth daily.   Yes Historical Provider, MD  lovastatin (MEVACOR) 20 MG tablet Take 1 tablet (20 mg total) by mouth at bedtime. 12/22/13  Yes Mosie Lukes, MD  nitroGLYCERIN (NITROSTAT) 0.4 MG SL  tablet Place 0.4 mg under the tongue every 5 (five) minutes as needed for chest pain.   Yes Historical Provider, MD  Omega-3 Fatty Acids (FISH OIL) 1200 MG CAPS Take 1,200 mg by mouth daily.    Yes Historical Provider, MD  pantoprazole (PROTONIX) 40 MG tablet Take 1 tablet (40 mg total) by mouth daily. 12/14/13  Yes Mosie Lukes, MD  quinapril (ACCUPRIL) 10  MG tablet Take 10 mg by mouth daily.   Yes Historical Provider, MD  triamterene-hydrochlorothiazide (MAXZIDE-25) 37.5-25 MG per tablet Take 0.5 tablets by mouth daily. 12/22/13  Yes Mosie Lukes, MD  vitamin B-12 (CYANOCOBALAMIN) 1000 MCG tablet Take 1,000 mcg by mouth daily.   Yes Historical Provider, MD    Scheduled Meds: . sodium chloride   Intravenous STAT  . [START ON 12/29/2013] Influenza vac split quadrivalent PF  0.5 mL Intramuscular Tomorrow-1000  . insulin aspart  0-9 Units Subcutaneous 6 times per day  . insulin glargine  5 Units Subcutaneous QHS  . pantoprazole (PROTONIX) IV  40 mg Intravenous Q24H  . sodium chloride  3 mL Intravenous Q12H   Infusions: . sodium chloride     PRN Meds: ondansetron (ZOFRAN) IV, ondansetron   Allergies as of 12/28/2013  . (No Known Allergies)    Family History  Problem Relation Age of Onset  . Coronary artery disease Father   . Hypertension Father   . Stroke Father   . Cancer Mother     ?  . Diabetes Maternal Uncle   . Colon cancer Neg Hx     History   Social History  . Marital Status: Widowed    Spouse Name: N/A    Number of Children: 4  . Years of Education: N/A   Occupational History  . retired    Social History Main Topics  . Smoking status: Former Smoker    Types: Cigarettes    Quit date: 12/31/2000  . Smokeless tobacco: Never Used  . Alcohol Use: No  . Drug Use: No  . Sexual Activity: Not on file   Other Topics Concern  . Not on file   Social History Narrative   Widowed - husband passed away in 05/15/09   lives with daughter with epilepsy - mental  retardation   Retired     Former Smoker      Alcohol use-no          Occupation: Retired       son - [place]: Constitutional:  Weakness, lack of energy ENT:  No nose bleeds Pulm:  No cough.  Some DOE. CV:  No palpitations, no LE edema. No chest pain GU:  No hematuria, no frequency GI:  Per HPI.  No dysphagia.  Heme:  Per HPI.  No large areas of bruising.    Transfusions:  In past, none in last 2 years Neuro:  No headaches, no peripheral tingling or numbness Derm:  No itching, no rash or sores.  Endocrine:  No sweats or chills.  No polyuria or dysuria Immunization:  Not queried.  Travel:  None beyond local counties in last few months.    PHYSICAL EXAM: Vital signs in last 24 hours: Filed Vitals:   12/28/13 0808  BP: 132/45  Pulse: 56  Temp: 98.4 F (36.9 C)  Resp: 18   Wt Readings from Last 3 Encounters:  12/28/13 72.394 kg (159 lb 9.6 oz)  12/22/13 70.217 kg (154 lb 12.8 oz)  12/13/13 70.364 kg (155 lb 2 oz)   General: pale, pleasant, comfortable elderly WF in NAD Head: no asymmetry or facial swelling Eyes:  No icterus.  + conj pallor.  EOMI Ears:  Not HOH  Nose:  No congestion or discharge Mouth:  Clear, moist.  Dentures in place.  No blood Neck:  No JVD, no mass, noTMG Lungs:  Clear bil .  No cough or labored  breathing  Heart: RRR.  No MRG.  Abdomen:  Soft, ND, no mass, no bruits.  Active BS.  Minimal diffuse tenderness.   Rectal: stool i   Musc/Skeltl: no joint swelling, redness or contracture Extremities:  No LE edema.  Feet warm Neurologic:  Oriented x 3.  Appropriate.   Skin:  No rash. Scabbed, sore on right lower arm Tattoos:  none Nodes:  No cervical adenopathy   Psych:  Pleasant, relaxed, cooperative.  Intake/Output from previous day:   Intake/Output this shift: Total I/O In: 49.6 [I.V.:49.6] Out: -   LAB RESULTS:  Recent Labs  12/28/13 0533  WBC 11.2*  HGB 7.9*  HCT 23.1*  PLT 242  MCV    88  BMET Lab  Results  Component Value Date   NA 140 12/28/2013   NA 133* 12/25/2013   NA 132* 12/22/2013   K 4.9 12/28/2013   K 4.5 12/25/2013   K 5.2* 12/22/2013   CL 106 12/28/2013   CL 102 12/25/2013   CL 107 12/22/2013   CO2 20 12/28/2013   CO2 20 12/25/2013   CO2 13* 12/22/2013   GLUCOSE 221* 12/28/2013   GLUCOSE 167* 12/25/2013   GLUCOSE 177* 12/22/2013   BUN 79* 12/28/2013   BUN 78* 12/25/2013   BUN 88* 12/22/2013   CREATININE 1.61* 12/28/2013   CREATININE 2.0* 12/25/2013   CREATININE 1.9* 12/22/2013   CALCIUM 9.1 12/28/2013   CALCIUM 9.6 12/25/2013   CALCIUM 9.7 12/22/2013   LFT  Recent Labs  12/28/13 0533  PROT 6.3  ALBUMIN 3.0*  AST 13  ALT 11  ALKPHOS 52  BILITOT 0.3   PT/INR Lab Results  Component Value Date   INR 1.27 12/28/2013    RADIOLOGY STUDIES: No results found.  ENDOSCOPIC STUDIES: Per HPI.   IMPRESSION:   *  GI bleeding: intermittent tarry stools and FOBT +.  But also some more red blood per hx and per rectal exam today. Hx of duodenal ulcers in 2009 and 2013.  Compliant with PPI.   *  ABL anemia.  Hx of chronic anemia but well controlled with Hgb of 12 from 05/2013 - 11/2011.  On B12 and po iron at home. MCV normocytic.  PRBC x 1 unit ordered for today.   *  Chronic low dose ASA and Plavix.   *  Chronic kidney disease.  Stage 4 by most recent GFR of 25 on 10/12.  Suspect some of the BUN elevation is due to GI bleeding induced azotemia.   *  UTI, treated with Omniceff starting 10/11 but now discontinued. No urine clx obtained.   *  Type 2 DM, insulin requiring.    PLAN:     *  Set up enteroscopy for 10/16 at 1330.   *  Ok to eat solids today. Clears in AM, than NPO.  *  CBC in AM.    Azucena Freed  12/28/2013, 11:57 AM Pager: [phone]  GI Attending Note   Chart was reviewed and patient was examined. X-rays and lab were reviewed.    I agree with management and plans. I suspect she is bleeding from AVMs.  Recurrent PUD is also a concern.   Any bleeding clearly is worsened with antiplatelet meds.  May need to reconsider whether she should stay on plavix.  Sandy Salaam. Deatra Ina, M.D., [facility] Cell [phone]

## 2013-12-28 NOTE — ED Notes (Addendum)
Pt.'s family reported black stools for 2 weeks and bloody ( incontinent) stools this morning with generalized weakness / pallor , fell last week with low back pain .

## 2013-12-28 NOTE — Telephone Encounter (Signed)
Destiny [name] informed and states pt is at the hospital and getting a blood transfusion  Pts son states he is upset the the hemoglobin has been dropping and a stool sample wasn't done. Destiny [name] states that pt told md that her stool was black but said she was taking an iron tablet but upset that a stool sample still wasn't done.  I informed Destiny [name] that I apologize for this and would pass the message to md

## 2013-12-28 NOTE — ED Provider Notes (Addendum)
Medical screening examination/treatment/procedure(s) were conducted as a shared visit with non-physician practitioner(s) and myself.  I personally evaluated the patient during the encounter.   EKG Interpretation None      Patient with past history of peptic ulcer with bleed about a year ago. Patient's been having some melena probably for about 2 weeks. Family members realized today that it was maroon. Patient also according to family feeling lightheaded and almost passing out or feeling fairly weak. Today's hemoglobin is below 8. Patient's not tachycardic. Patient's abdomen is soft and nontender. Patient is followed by [facility]. Patient will require admission. Patient will require blood transfusion. 2 units of blood ordered and transfusion requested.  Results for orders placed during the hospital encounter of 12/28/13  CBC WITH DIFFERENTIAL      Result Value Ref Range   WBC 11.2 (*) 4.0 - 10.5 K/uL   RBC 2.60 (*) 3.87 - 5.11 MIL/uL   Hemoglobin 7.9 (*) 12.0 - 15.0 g/dL   HCT 23.1 (*) 36.0 - 46.0 %   MCV 88.8  78.0 - 100.0 fL   MCH 30.4  26.0 - 34.0 pg   MCHC 34.2  30.0 - 36.0 g/dL   RDW 13.6  11.5 - 15.5 %   Platelets 242  150 - 400 K/uL   Neutrophils Relative % 70  43 - 77 %   Neutro Abs 7.8 (*) 1.7 - 7.7 K/uL   Lymphocytes Relative 22  12 - 46 %   Lymphs Abs 2.4  0.7 - 4.0 K/uL   Monocytes Relative 6  3 - 12 %   Monocytes Absolute 0.6  0.1 - 1.0 K/uL   Eosinophils Relative 2  0 - 5 %   Eosinophils Absolute 0.3  0.0 - 0.7 K/uL   Basophils Relative 0  0 - 1 %   Basophils Absolute 0.1  0.0 - 0.1 K/uL  COMPREHENSIVE METABOLIC PANEL      Result Value Ref Range   Sodium 140  137 - 147 mEq/L   Potassium 4.9  3.7 - 5.3 mEq/L   Chloride 106  96 - 112 mEq/L   CO2 20  19 - 32 mEq/L   Glucose, Bld 221 (*) 70 - 99 mg/dL   BUN 79 (*) 6 - 23 mg/dL   Creatinine, Ser 1.61 (*) 0.50 - 1.10 mg/dL   Calcium 9.1  8.4 - 10.5 mg/dL   Total Protein 6.3  6.0 - 8.3 g/dL   Albumin 3.0 (*) 3.5  - 5.2 g/dL   AST 13  0 - 37 U/L   ALT 11  0 - 35 U/L   Alkaline Phosphatase 52  39 - 117 U/L   Total Bilirubin 0.3  0.3 - 1.2 mg/dL   GFR calc non Af Amer 29 (*) >90 mL/min   GFR calc Af Amer 34 (*) >90 mL/min   Anion gap 14  5 - 15  SAMPLE TO BLOOD BANK      Result Value Ref Range   Blood Bank Specimen SAMPLE AVAILABLE FOR TESTING     Sample Expiration 12/29/2013     CRITICAL CARE Performed by: Fredia Sorrow Total critical care time: 30 Critical care time was exclusive of separately billable procedures and treating other patients. Critical care was necessary to treat or prevent imminent or life-threatening deterioration. Critical care was time spent personally by me on the following activities: development of treatment plan with patient and/or surrogate as well as nursing, discussions with consultants, evaluation of patient's response to treatment,  examination of patient, obtaining history from patient or surrogate, ordering and performing treatments and interventions, ordering and review of laboratory studies, ordering and review of radiographic studies, pulse oximetry and re-evaluation of patient's condition.   Fredia Sorrow, MD 12/28/13 [id number]  Fredia Sorrow, MD 12/28/13 [phone]

## 2013-12-28 NOTE — Progress Notes (Signed)
Pt's family member wanted to make her MD aware that she was having some pain on her buttock. When the RN asked the patient if she was having any pain she said she was not. Will continue to monitor.

## 2013-12-28 NOTE — Progress Notes (Signed)
Utilization review completed. Myson Levi, RN, BSN. 

## 2013-12-28 NOTE — ED Notes (Signed)
Family states they cleaned pt this am and there was bright blood in stool along with black/tarry stool.

## 2013-12-28 NOTE — H&P (Signed)
History and Physical  [name] [id number] DOB: 05/22/1931 DOA: 12/28/2013  Referring physician: Jeannett Senior, ER PA PCP: Penni Homans, MD   Chief Complaint: Black stools  HPI: Destiny [name] is a 80 y.o. female  With past medical history of duodenal ulcer, CAD status post CABG, CVA on Plavix and aspirin and chronic kidney disease who has had intermittent black tarry stool for the last 2 weeks. During that time, she is noted that she's become much more easily winded with any kind of exertion and even a little lightheaded. She's not had any nausea vomiting. Has noted some generalized nonspecific abdominal tenderness. She has been seeing her primary care physician several times for related issues as well as a UTI and it was noted that her hemoglobin was at baseline of 12.2 two weeks ago and dropped to 11.1 four days ago, then 10.6 three days ago.  Today she came into the emergency room and was noted to have a hemoglobin of 7.9. Her white count was mildly elevated at 11.2. Interestingly, patient's creatinine was actually improved from previous at 1.61 today.  Patient has a history of chronic bradycardia, but she was not noted to be hypotensive. She was ordered a blood transfusion in hospitalists were called for further evaluation and admission.   Review of Systems:  Patient seen after arrival to floor Pt complains of generalized weakness and easy fatigability as well as intermittent black tarry stools. Or  Past Medical History  Diagnosis Date  . PVD (peripheral vascular disease)   . CAD (coronary artery disease)     a. s/p CABG x 4 (LIMA->LAD, VG->Diag, VG->OM1->OM3);  b. 2010 Cath: 3/4 patent grafts (VG->diag occluded), 3vd, sev PVD ->Med Rx;  c. 12/2011 Lexi MV: sm area of mild mid and apical ant wall ischemia ->med rx.  . Hypertension   . Hyperlipidemia   . Duodenal ulcer 2009 and 11/2011  . Iron deficiency anemia secondary to blood loss (chronic)   . Amaurosis fugax   .  Carotid bruit   . Diabetes mellitus type II   . Dementia     pt denies  . Diabetic retinopathy   . Gallstones 2009    seen on CT scan  . Diverticulosis   . Atherosclerosis   . Renal insufficiency   . CVA (cerebral infarction)   . Diabetic peripheral neuropathy   . GI bleed 04/2011, 11/2011    Cause not defined.   . Colon polyp   . Arthritis 12/06/2012  . Hypersomnolence 03/12/2013  . Right foot pain 04/10/2013  . Acute renal insufficiency 12/24/2013  . Depression 12/24/2013  . Melena 1015/2015    PEPTIC ULCER    REQUIRING TRANSFUSION   Past Surgical History  Procedure Laterality Date  . Coronary artery bypass graft  1999    x 4  . Abdominal hysterectomy    . Upper gastrointestinal endoscopy  02/22/2008    w/biopsy, duedenal ulcer, antrum erosions  . Esophagogastroduodenoscopy  05/16/2011    Procedure: ESOPHAGOGASTRODUODENOSCOPY (EGD);  Surgeon: Gatha Mayer, MD;  Location: [facility] ENDOSCOPY;  Service: Endoscopy;  Laterality: N/A;  . Colonoscopy  05/20/2011    Procedure: COLONOSCOPY;  Surgeon: Scarlette Shorts, MD;  Location: [facility];  Service: Endoscopy;  Laterality: N/A;  . Esophagogastroduodenoscopy  12/08/2011    Procedure: ESOPHAGOGASTRODUODENOSCOPY (EGD);  Surgeon: Ladene Artist, MD,FACG;  Location[facility] ENDOSCOPY;  Service: Endoscopy;  Laterality: N/A;   Social History:  reports that she quit smoking about 13 years ago. Her smoking use  included Cigarettes. She smoked 0.00 packs per day. She has never used smokeless tobacco. She reports that she does not drink alcohol or use illicit drugs. Patient lives at home with her daughter & is able to participate in activities of daily living without the use of assistance  No Known Allergies  Family History  Problem Relation Age of Onset  . Coronary artery disease Father   . Hypertension Father   . Stroke Father   . Cancer Mother     ?  . Diabetes Maternal Uncle   . Colon cancer Neg Hx     Reviewed with family  Prior to Admission  medications   Medication Sig Start Date End Date Taking? Authorizing Provider  amLODipine (NORVASC) 10 MG tablet Take 10 mg by mouth daily.   Yes Historical Provider, MD  aspirin 81 MG tablet Take 1 tablet (81 mg total) by mouth daily. 06/07/12  Yes Rogelia Mire, NP  cefdinir (OMNICEF) 300 MG capsule Take 1 capsule (300 mg total) by mouth 2 (two) times daily. 12/22/13 01/01/14 Yes Mosie Lukes, MD  Cholecalciferol (VITAMIN D) 1000 UNITS capsule Take 1,000 Units by mouth every evening.    Yes Historical Provider, MD  citalopram (CELEXA) 10 MG tablet Take 1 tablet (10 mg total) by mouth daily. 12/22/13  Yes Mosie Lukes, MD  clopidogrel (PLAVIX) 75 MG tablet Take 75 mg by mouth daily.   Yes Historical Provider, MD  colesevelam (WELCHOL) 625 MG tablet Take 625 mg by mouth 2 (two) times daily with a meal.   Yes Historical Provider, MD  donepezil (ARICEPT) 10 MG tablet Take 10 mg by mouth at bedtime.   Yes Historical Provider, MD  Ferrous Sulfate (IRON) 325 (65 FE) MG TABS Take 325 mg by mouth 2 (two) times daily.    Yes Historical Provider, MD  furosemide (LASIX) 20 MG tablet Take 20 mg by mouth daily.   Yes Historical Provider, MD  Glucosamine-Chondroit-Vit C-Mn (GLUCOSAMINE CHONDR 500 COMPLEX) CAPS Take 1 capsule by mouth daily.    Yes Historical Provider, MD  insulin glargine (LANTUS) 100 UNIT/ML injection Inject 40 Units into the skin at bedtime.   Yes Historical Provider, MD  insulin lispro (HUMALOG KWIKPEN) 100 UNIT/ML KiwkPen Inject 0.05-0.1 mLs (5-10 Units total) into the skin 3 (three) times daily with meals. 12/18/13  Yes Mosie Lukes, MD  isosorbide mononitrate (IMDUR) 60 MG 24 hr tablet Take 60 mg by mouth daily.   Yes Historical Provider, MD  lovastatin (MEVACOR) 20 MG tablet Take 1 tablet (20 mg total) by mouth at bedtime. 12/22/13  Yes Mosie Lukes, MD  nitroGLYCERIN (NITROSTAT) 0.4 MG SL tablet Place 0.4 mg under the tongue every 5 (five) minutes as needed for chest pain.   Yes  Historical Provider, MD  Omega-3 Fatty Acids (FISH OIL) 1200 MG CAPS Take 1,200 mg by mouth daily.    Yes Historical Provider, MD  pantoprazole (PROTONIX) 40 MG tablet Take 1 tablet (40 mg total) by mouth daily. 12/14/13  Yes Mosie Lukes, MD  quinapril (ACCUPRIL) 10 MG tablet Take 10 mg by mouth daily.   Yes Historical Provider, MD  triamterene-hydrochlorothiazide (MAXZIDE-25) 37.5-25 MG per tablet Take 0.5 tablets by mouth daily. 12/22/13  Yes Mosie Lukes, MD  vitamin B-12 (CYANOCOBALAMIN) 1000 MCG tablet Take 1,000 mcg by mouth daily.   Yes Historical Provider, MD    Physical Exam: BP 132/45  Pulse 56  Temp(Src) 98.4 F (36.9 C) (Oral)  Resp 18  Ht 5\' 6"  (1.676 m)  Wt 72.394 kg (159 lb 9.6 oz)  BMI 25.77 kg/m2  SpO2 99%  General:  Alert and oriented x3, no acute distress, fatigued Eyes: Sclera nonicteric, extraocular movements are intact ENT: Normocephalic, atraumatic, mucous membranes are dry Neck: No JVD Cardiovascular: Bradycardic, regular rhythm Respiratory: Clear to auscultation bilaterally Abdomen: Soft, nontender, positive bowel sounds Skin: No skin breaks, tears or lesions Musculoskeletal: No clubbing or cyanosis, trace edema Psychiatric: Patient is appropriate, no evidence of psychoses Neurologic: No focal deficits           Labs on Admission:  Basic Metabolic Panel:  Recent Labs Lab 12/22/13 1213 12/25/13 1108 12/28/13 0533  NA 132* 133* 140  K 5.2* 4.5 4.9  CL 107 102 106  CO2 13* 20 20  GLUCOSE 177* 167* 221*  BUN 88* 78* 79*  CREATININE 1.9* 2.0* 1.61*  CALCIUM 9.7 9.6 9.1  PHOS 3.9 4.0  --    Liver Function Tests:  Recent Labs Lab 12/22/13 1213 12/25/13 1108 12/28/13 0533  AST 20  --  13  ALT 14  --  11  ALKPHOS 64  --  52  BILITOT 0.3  --  0.3  PROT 8.1  --  6.3  ALBUMIN 3.5  3.5 3.4* 3.0*   No results found for this basename: LIPASE, AMYLASE,  in the last 168 hours No results found for this basename: AMMONIA,  in the last 168  hours CBC:  Recent Labs Lab 12/22/13 1213 12/25/13 1108 12/28/13 0533  WBC 12.2* 7.0 11.2*  NEUTROABS  --   --  7.8*  HGB 11.1* 10.6* 7.9*  HCT 32.0* 31.6* 23.1*  MCV 91.5 91.1 88.8  PLT 342.0 295.0 242   Cardiac Enzymes: No results found for this basename: CKTOTAL, CKMB, CKMBINDEX, TROPONINI,  in the last 168 hours  BNP (last 3 results) No results found for this basename: PROBNP,  in the last 8760 hours CBG: No results found for this basename: GLUCAP,  in the last 168 hours  Radiological Exams on Admission: No results found.    Assessment/Plan Present on Admission:  . Bradycardia: Looks to be a chronic process. TSH checked last week was stable. We'll discuss with cardiology based on results of echo.  . Dementia: Holding patient's Aricept.  Marland Kitchen CAD (coronary artery disease): Stable for now. Patient with no chest pain.  . Essential hypertension: Blood pressure actually stable. Hold antihypertensives.  . Hyperlipidemia: Stable. Hold statin.  . CKD (chronic kidney disease) stage 4, GFR 15-29 ml/min: Looks to be at baseline. Creatinine today actually improved from patient's previous.  . DM type 2, uncontrolled, with renal complications: Every 4 hours sliding scale plus Lantus 5 units only  . Acute blood loss anemia: Secondary GI bleed. See below.  Marland Kitchen GIB (gastrointestinal bleeding): Principal problem. N.p.o., one unit packed red blood cell transfusion. I spoke with [place] [facility] who will see. Holding patient's medications including aspirin and Plavix. Have started IV PPI.  Marland Kitchen Chronic diastolic heart failure: Looking at patient's previous echocardiogram, done in 2009, she actually has chronic diastolic heart failure. Given drop in volumes are low crit aggressive fluid resuscitation, we'll check BNP and repeat echocardiogram is as outlined in the system since.  Overweight: Patient needs criteria with BMI greater than 25. Noted low albumin, more so as of late. Will  ask nutrition to see.  Consultants: [facility]  Code Status: DO NOT RESUSCITATE as confirmed by patient  Family Communication: Daughter and 2 sons at the  bedside   Disposition Plan: Likely here for several days as we stabilized bleeding, determine volume status, and may need endoscopy  Time spent: 35 minutes  [facility] Pager [phone]

## 2013-12-28 NOTE — Telephone Encounter (Signed)
Spoke with son by phone. Patient presently in hospital after sudden onset of BRBPR over night. He notes she had not reported this to him previously. She had been noting a great deal of loose stool with symptoms of gastroenteritis the week prior to this. She had tolerated her recent antibiotic but had still been eating and drinking poorly. Son notes mother has increased confusion and decreased energy for a while now. Will see her in follow up after hospitalization

## 2013-12-28 NOTE — Progress Notes (Signed)
Paged flow manager to see who the attending was, returned called from [place], [place].  Informed that Dr. Lyman Speller will be attending.  Dr. Maryland Pink texted paged to inform him that pt. Has arrived to [id number].  Will continue to monitor and await for return call or orders.  Alphonzo Lemmings, RN

## 2013-12-29 ENCOUNTER — Inpatient Hospital Stay (HOSPITAL_COMMUNITY): Payer: Medicare Other | Admitting: Anesthesiology

## 2013-12-29 ENCOUNTER — Encounter (HOSPITAL_COMMUNITY): Payer: Self-pay | Admitting: Anesthesiology

## 2013-12-29 ENCOUNTER — Encounter (HOSPITAL_COMMUNITY)
Admission: EM | Disposition: A | Discharge status: Home / Self Care | Payer: Self-pay | Source: Home / Self Care | Attending: Internal Medicine

## 2013-12-29 ENCOUNTER — Encounter (HOSPITAL_COMMUNITY): Payer: Medicare Other | Admitting: Anesthesiology

## 2013-12-29 DIAGNOSIS — E1165 Type 2 diabetes mellitus with hyperglycemia: Secondary | ICD-10-CM

## 2013-12-29 DIAGNOSIS — K922 Gastrointestinal hemorrhage, unspecified: Secondary | ICD-10-CM | POA: Diagnosis not present

## 2013-12-29 DIAGNOSIS — E1129 Type 2 diabetes mellitus with other diabetic kidney complication: Secondary | ICD-10-CM

## 2013-12-29 DIAGNOSIS — I10 Essential (primary) hypertension: Secondary | ICD-10-CM

## 2013-12-29 DIAGNOSIS — E538 Deficiency of other specified B group vitamins: Secondary | ICD-10-CM

## 2013-12-29 DIAGNOSIS — K264 Chronic or unspecified duodenal ulcer with hemorrhage: Secondary | ICD-10-CM

## 2013-12-29 DIAGNOSIS — F0390 Unspecified dementia without behavioral disturbance: Secondary | ICD-10-CM

## 2013-12-29 DIAGNOSIS — D649 Anemia, unspecified: Secondary | ICD-10-CM

## 2013-12-29 HISTORY — PX: ENTEROSCOPY: SHX5533

## 2013-12-29 LAB — BASIC METABOLIC PANEL
Anion gap: 12 (ref 5–15)
BUN: 49 mg/dL — AB (ref 6–23)
CHLORIDE: 112 meq/L (ref 96–112)
CO2: 18 mEq/L — ABNORMAL LOW (ref 19–32)
Calcium: 9 mg/dL (ref 8.4–10.5)
Creatinine, Ser: 1.26 mg/dL — ABNORMAL HIGH (ref 0.50–1.10)
GFR calc non Af Amer: 39 mL/min — ABNORMAL LOW (ref >90)
GFR, EST AFRICAN AMERICAN: 45 mL/min — AB (ref >90)
GLUCOSE: 106 mg/dL — AB (ref 70–99)
Potassium: 4.3 mEq/L (ref 3.7–5.3)
SODIUM: 142 meq/L (ref 137–147)

## 2013-12-29 LAB — CBC
HEMATOCRIT: 28.2 % — AB (ref 36.0–46.0)
HEMOGLOBIN: 9.7 g/dL — AB (ref 12.0–15.0)
MCH: 30.3 pg (ref 26.0–34.0)
MCHC: 34.4 g/dL (ref 30.0–36.0)
MCV: 88.1 fL (ref 78.0–100.0)
Platelets: 158 10*3/uL (ref 150–400)
RBC: 3.2 MIL/uL — ABNORMAL LOW (ref 3.87–5.11)
RDW: 14.5 % (ref 11.5–15.5)
WBC: 8.8 10*3/uL (ref 4.0–10.5)

## 2013-12-29 LAB — GLUCOSE, CAPILLARY
Glucose-Capillary: 111 mg/dL — ABNORMAL HIGH (ref 70–99)
Glucose-Capillary: 114 mg/dL — ABNORMAL HIGH (ref 70–99)
Glucose-Capillary: 117 mg/dL — ABNORMAL HIGH (ref 70–99)
Glucose-Capillary: 118 mg/dL — ABNORMAL HIGH (ref 70–99)
Glucose-Capillary: 126 mg/dL — ABNORMAL HIGH (ref 70–99)
Glucose-Capillary: 160 mg/dL — ABNORMAL HIGH (ref 70–99)
Glucose-Capillary: 184 mg/dL — ABNORMAL HIGH (ref 70–99)
Glucose-Capillary: 77 mg/dL (ref 70–99)

## 2013-12-29 SURGERY — ENTEROSCOPY
Anesthesia: Monitor Anesthesia Care

## 2013-12-29 MED ORDER — FENTANYL CITRATE 0.05 MG/ML IJ SOLN: 25.0000 ug | INTRAMUSCULAR | Status: DC | PRN

## 2013-12-29 MED ORDER — PROPOFOL INFUSION 10 MG/ML OPTIME
INTRAVENOUS | Status: DC | PRN
  Administered 2013-12-29: 140 ug/kg/min via INTRAVENOUS

## 2013-12-29 MED ORDER — LACTATED RINGERS IV SOLN
INTRAVENOUS | Status: DC | PRN
  Administered 2013-12-29: 14:00:00 via INTRAVENOUS

## 2013-12-29 MED ORDER — GLYCOPYRROLATE 0.2 MG/ML IJ SOLN
INTRAMUSCULAR | Status: DC | PRN
  Administered 2013-12-29: 0.2 mg via INTRAVENOUS

## 2013-12-29 MED ORDER — BUTAMBEN-TETRACAINE-BENZOCAINE 2-2-14 % EX AERO
INHALATION_SPRAY | CUTANEOUS | Status: DC | PRN
  Administered 2013-12-29: 2 via TOPICAL

## 2013-12-29 MED ORDER — FENTANYL CITRATE 0.05 MG/ML IJ SOLN
INTRAMUSCULAR | Status: DC | PRN
  Administered 2013-12-29 (×2): 25 ug via INTRAVENOUS

## 2013-12-29 NOTE — Progress Notes (Signed)
Enteroscopy demonstrated an active ulcer at the junction between the bulb and the second portion of the duodenum.  Recommendations #1 twice a day PPI therapy #2 avoid NSAIDs and ASA products #3 await biopsy to rule out H. pylori

## 2013-12-29 NOTE — Progress Notes (Signed)
Hgb went from 7.9 to 7.4 following one unit of PRBC, one additional unit transfused, no post transfusion Hgb results yet. BP at 110/30 at 0116 this AM.   Pt is on for Entersoscopy today at 1330 for hx of melena/maroon stool per rectum.  CBC in process for this AM.  Azucena Freed

## 2013-12-29 NOTE — Interval H&P Note (Signed)
History and Physical Interval Note:  12/29/2013 2:11 PM  Destiny [name]  has presented today for surgery, with the diagnosis of melenic stools and ABL anemia.  hx of duodenal ulcers.  The various methods of treatment have been discussed with the patient and family. After consideration of risks, benefits and other options for treatment, the patient has consented to  Procedure(s): ENTEROSCOPY (N/A) as a surgical intervention .  The patient's history has been reviewed, patient examined, no change in status, stable for surgery.  I have reviewed the patient's chart and labs.  Questions were answered to the patient's satisfaction.    The recent H&P (dated *12/28/13**) was reviewed, the patient was examined and there is no change in the patients condition since that H&P was completed.   Erskine Emery  12/29/2013, 2:11 PM     Erskine Emery

## 2013-12-29 NOTE — H&P (View-Only) (Signed)
[facility] Consult: 11:57 AM 12/28/2013  LOS: 0 days    Referring Provider: Dr Gevena Barre  Primary Care Physician:  Penni Homans, MD Primary Gastroenterologist:  Dr. Carlean Purl    Reason for Consultation:  Tarry stools and anemia.    HPI: Destiny [name] is a 80 y.o. female.  On chronic Plavix and 81 ASA (hx CVA and CAD/CABG but no stents)  . Has IDDM, mild dementia, CKD.     Hx of GI bleeding and iron deficiency anemia. Multiple endoscopic evaluations including:  2009 EGD for melena showed duodenal bulb ulcer felt secondary to NSAIDs. Clotest negative.  05/2011 EGD normal.  05/2011 Colonoscopy showed severe, pan diverticulosis.  06/2011 Capsule endoscopy normal.  11/2011 EGD by Dr Fuller Plan for ABL anemia and hematochezia/melena showed non-bleeding DU.   She takes po Iron bid, Protonix 40 mg daily, oral B12 daily in addition to multiple other medications.  No NSAIDs.    As of 9/30 visit to PMD, she was c/o 3 to 4 days of nausea and loose stools. Felt to be having viral gastroenteritis.  This was not described as melenic or bloody by pt but in retrospect, dtr says it was tarry.  Last night had another large volume bloody/tarry stool. These are different from the usual formed, dark stool that is normal as she is taking her iron.  There has been no nausea, no anorexia, nor abdominal pain but she does report all over abd tenderness to exam yesterday and today.  On 10/11 ROV she reported weakness, feeling "blah" and run down for 2 weeks and falls at home.  At that visit her PMD started [facility] and discontinued ACEI and Lasix as she had worsening renal insufficiency (BUN up from 45 in July, 51 on 9/30, to 88 on 10/9, however the Creatinine was headed in downward direction)  Admitted this morning, 12/28/13, with  tarry stools and increasing DOE, slight lightheadedness.   Stool is FOBT +.  No large hematomas, no blood in urine or from nose.   Hgb was 12.2 two weeks ago, 10.6 three days ago and it is 7.9 today. BUN is 79.    Past Medical History  Diagnosis Date  . PVD (peripheral vascular disease)   . CAD (coronary artery disease)     a. s/p CABG x 4 (LIMA->LAD, VG->Diag, VG->OM1->OM3);  b. 2010 Cath: 3/4 patent grafts (VG->diag occluded), 3vd, sev PVD ->Med Rx;  c. 12/2011 Lexi MV: sm area of mild mid and apical ant wall ischemia ->med rx.  . Hypertension   . Hyperlipidemia   . Duodenal ulcer 2009 and 11/2011  . Iron deficiency anemia secondary to blood loss (chronic)   . Amaurosis fugax   . Carotid bruit   . Diabetes mellitus type II   . Dementia     pt denies  . Diabetic retinopathy   . Gallstones 2009    seen on CT scan  . Diverticulosis   . Atherosclerosis   . Renal insufficiency   . CVA (cerebral infarction)   . Diabetic peripheral neuropathy   .  GI bleed 04/2011, 11/2011    Cause not defined.   . Colon polyp   . Arthritis 12/06/2012  . Hypersomnolence 03/12/2013  . Right foot pain 04/10/2013  . Acute renal insufficiency 12/24/2013  . Depression 12/24/2013  . Melena 1015/2015    PEPTIC ULCER    REQUIRING TRANSFUSION    Past Surgical History  Procedure Laterality Date  . Coronary artery bypass graft  1999    x 4  . Abdominal hysterectomy    . Upper gastrointestinal endoscopy  02/22/2008    w/biopsy, duedenal ulcer, antrum erosions  . Esophagogastroduodenoscopy  05/16/2011    Procedure: ESOPHAGOGASTRODUODENOSCOPY (EGD);  Surgeon: Gatha Mayer, MD;  Location: [facility] ENDOSCOPY;  Service: Endoscopy;  Laterality: N/A;  . Colonoscopy  05/20/2011    Procedure: COLONOSCOPY;  Surgeon: Scarlette Shorts, MD;  Location: [place];  Service: Endoscopy;  Laterality: N/A;  . Esophagogastroduodenoscopy  12/08/2011    Procedure: ESOPHAGOGASTRODUODENOSCOPY (EGD);  Surgeon: Ladene Artist, MD,FACG;   Location: [facility] ENDOSCOPY;  Service: Endoscopy;  Laterality: N/A;    Prior to Admission medications   Medication Sig Start Date End Date Taking? Authorizing Provider  amLODipine (NORVASC) 10 MG tablet Take 10 mg by mouth daily.   Yes Historical Provider, MD  aspirin 81 MG tablet Take 1 tablet (81 mg total) by mouth daily. 06/07/12  Yes Rogelia Mire, NP  cefdinir (OMNICEF) 300 MG capsule Take 1 capsule (300 mg total) by mouth 2 (two) times daily. 12/22/13 01/01/14 Yes Mosie Lukes, MD  Cholecalciferol (VITAMIN D) 1000 UNITS capsule Take 1,000 Units by mouth every evening.    Yes Historical Provider, MD  citalopram (CELEXA) 10 MG tablet Take 1 tablet (10 mg total) by mouth daily. 12/22/13  Yes Mosie Lukes, MD  clopidogrel (PLAVIX) 75 MG tablet Take 75 mg by mouth daily.   Yes Historical Provider, MD  colesevelam (WELCHOL) 625 MG tablet Take 625 mg by mouth 2 (two) times daily with a meal.   Yes Historical Provider, MD  donepezil (ARICEPT) 10 MG tablet Take 10 mg by mouth at bedtime.   Yes Historical Provider, MD  Ferrous Sulfate (IRON) 325 (65 FE) MG TABS Take 325 mg by mouth 2 (two) times daily.    Yes Historical Provider, MD  furosemide (LASIX) 20 MG tablet Take 20 mg by mouth daily.   Yes Historical Provider, MD  Glucosamine-Chondroit-Vit C-Mn (GLUCOSAMINE CHONDR 500 COMPLEX) CAPS Take 1 capsule by mouth daily.    Yes Historical Provider, MD  insulin glargine (LANTUS) 100 UNIT/ML injection Inject 40 Units into the skin at bedtime.   Yes Historical Provider, MD  insulin lispro (HUMALOG KWIKPEN) 100 UNIT/ML KiwkPen Inject 0.05-0.1 mLs (5-10 Units total) into the skin 3 (three) times daily with meals. 12/18/13  Yes Mosie Lukes, MD  isosorbide mononitrate (IMDUR) 60 MG 24 hr tablet Take 60 mg by mouth daily.   Yes Historical Provider, MD  lovastatin (MEVACOR) 20 MG tablet Take 1 tablet (20 mg total) by mouth at bedtime. 12/22/13  Yes Mosie Lukes, MD  nitroGLYCERIN (NITROSTAT) 0.4 MG SL  tablet Place 0.4 mg under the tongue every 5 (five) minutes as needed for chest pain.   Yes Historical Provider, MD  Omega-3 Fatty Acids (FISH OIL) 1200 MG CAPS Take 1,200 mg by mouth daily.    Yes Historical Provider, MD  pantoprazole (PROTONIX) 40 MG tablet Take 1 tablet (40 mg total) by mouth daily. 12/14/13  Yes Mosie Lukes, MD  quinapril (ACCUPRIL) 10  MG tablet Take 10 mg by mouth daily.   Yes Historical Provider, MD  triamterene-hydrochlorothiazide (MAXZIDE-25) 37.5-25 MG per tablet Take 0.5 tablets by mouth daily. 12/22/13  Yes Mosie Lukes, MD  vitamin B-12 (CYANOCOBALAMIN) 1000 MCG tablet Take 1,000 mcg by mouth daily.   Yes Historical Provider, MD    Scheduled Meds: . sodium chloride   Intravenous STAT  . [START ON 12/29/2013] Influenza vac split quadrivalent PF  0.5 mL Intramuscular Tomorrow-1000  . insulin aspart  0-9 Units Subcutaneous 6 times per day  . insulin glargine  5 Units Subcutaneous QHS  . pantoprazole (PROTONIX) IV  40 mg Intravenous Q24H  . sodium chloride  3 mL Intravenous Q12H   Infusions: . sodium chloride     PRN Meds: ondansetron (ZOFRAN) IV, ondansetron   Allergies as of 12/28/2013  . (No Known Allergies)    Family History  Problem Relation Age of Onset  . Coronary artery disease Father   . Hypertension Father   . Stroke Father   . Cancer Mother     ?  . Diabetes Maternal Uncle   . Colon cancer Neg Hx     History   Social History  . Marital Status: Widowed    Spouse Name: N/A    Number of Children: 4  . Years of Education: N/A   Occupational History  . retired    Social History Main Topics  . Smoking status: Former Smoker    Types: Cigarettes    Quit date: 12/31/2000  . Smokeless tobacco: Never Used  . Alcohol Use: No  . Drug Use: No  . Sexual Activity: Not on file   Other Topics Concern  . Not on file   Social History Narrative   Widowed - husband passed away in 2009-05-12   lives with daughter with epilepsy - mental  retardation   Retired     Former Smoker      Alcohol use-no          Occupation: Retired       son - [place]: Constitutional:  Weakness, lack of energy ENT:  No nose bleeds Pulm:  No cough.  Some DOE. CV:  No palpitations, no LE edema. No chest pain GU:  No hematuria, no frequency GI:  Per HPI.  No dysphagia.  Heme:  Per HPI.  No large areas of bruising.    Transfusions:  In past, none in last 2 years Neuro:  No headaches, no peripheral tingling or numbness Derm:  No itching, no rash or sores.  Endocrine:  No sweats or chills.  No polyuria or dysuria Immunization:  Not queried.  Travel:  None beyond local counties in last few months.    PHYSICAL EXAM: Vital signs in last 24 hours: Filed Vitals:   12/28/13 0808  BP: 132/45  Pulse: 56  Temp: 98.4 F (36.9 C)  Resp: 18   Wt Readings from Last 3 Encounters:  12/28/13 72.394 kg (159 lb 9.6 oz)  12/22/13 70.217 kg (154 lb 12.8 oz)  12/13/13 70.364 kg (155 lb 2 oz)   General: pale, pleasant, comfortable elderly WF in NAD Head: no asymmetry or facial swelling Eyes:  No icterus.  + conj pallor.  EOMI Ears:  Not HOH  Nose:  No congestion or discharge Mouth:  Clear, moist.  Dentures in place.  No blood Neck:  No JVD, no mass, noTMG Lungs:  Clear bil .  No cough or labored  breathing  Heart: RRR.  No MRG.  Abdomen:  Soft, ND, no mass, no bruits.  Active BS.  Minimal diffuse tenderness.   Rectal: stool i   Musc/Skeltl: no joint swelling, redness or contracture Extremities:  No LE edema.  Feet warm Neurologic:  Oriented x 3.  Appropriate.   Skin:  No rash. Scabbed, sore on right lower arm Tattoos:  none Nodes:  No cervical adenopathy   Psych:  Pleasant, relaxed, cooperative.  Intake/Output from previous day:   Intake/Output this shift: Total I/O In: 49.6 [I.V.:49.6] Out: -   LAB RESULTS:  Recent Labs  12/28/13 0533  WBC 11.2*  HGB 7.9*  HCT 23.1*  PLT 242  MCV    88  BMET Lab  Results  Component Value Date   NA 140 12/28/2013   NA 133* 12/25/2013   NA 132* 12/22/2013   K 4.9 12/28/2013   K 4.5 12/25/2013   K 5.2* 12/22/2013   CL 106 12/28/2013   CL 102 12/25/2013   CL 107 12/22/2013   CO2 20 12/28/2013   CO2 20 12/25/2013   CO2 13* 12/22/2013   GLUCOSE 221* 12/28/2013   GLUCOSE 167* 12/25/2013   GLUCOSE 177* 12/22/2013   BUN 79* 12/28/2013   BUN 78* 12/25/2013   BUN 88* 12/22/2013   CREATININE 1.61* 12/28/2013   CREATININE 2.0* 12/25/2013   CREATININE 1.9* 12/22/2013   CALCIUM 9.1 12/28/2013   CALCIUM 9.6 12/25/2013   CALCIUM 9.7 12/22/2013   LFT  Recent Labs  12/28/13 0533  PROT 6.3  ALBUMIN 3.0*  AST 13  ALT 11  ALKPHOS 52  BILITOT 0.3   PT/INR Lab Results  Component Value Date   INR 1.27 12/28/2013    RADIOLOGY STUDIES: No results found.  ENDOSCOPIC STUDIES: Per HPI.   IMPRESSION:   *  GI bleeding: intermittent tarry stools and FOBT +.  But also some more red blood per hx and per rectal exam today. Hx of duodenal ulcers in 2009 and 2013.  Compliant with PPI.   *  ABL anemia.  Hx of chronic anemia but well controlled with Hgb of 12 from 05/2013 - 11/2011.  On B12 and po iron at home. MCV normocytic.  PRBC x 1 unit ordered for today.   *  Chronic low dose ASA and Plavix.   *  Chronic kidney disease.  Stage 4 by most recent GFR of 25 on 10/12.  Suspect some of the BUN elevation is due to GI bleeding induced azotemia.   *  UTI, treated with Omniceff starting 10/11 but now discontinued. No urine clx obtained.   *  Type 2 DM, insulin requiring.    PLAN:     *  Set up enteroscopy for 10/16 at 1330.   *  Ok to eat solids today. Clears in AM, than NPO.  *  CBC in AM.    Azucena Freed  12/28/2013, 11:57 AM Pager: [phone]  GI Attending Note   Chart was reviewed and patient was examined. X-rays and lab were reviewed.    I agree with management and plans. I suspect she is bleeding from AVMs.  Recurrent PUD is also a concern.   Any bleeding clearly is worsened with antiplatelet meds.  May need to reconsider whether she should stay on plavix.  Sandy Salaam. Deatra Ina, M.D., [facility] Cell [phone]

## 2013-12-29 NOTE — Progress Notes (Signed)
Pt received 1 U of PRBCS. A post transfusion CBC was not done so the night RN ordered one to be drawn. Pts Hgb was 7.4. On call provider notified. 1 U of PRBCs ordered.

## 2013-12-29 NOTE — Progress Notes (Signed)
INITIAL NUTRITION ASSESSMENT  DOCUMENTATION CODES Per approved criteria  -Not Applicable   INTERVENTION: -RD to follow for diet advancement -Supplement diet as appropriate  NUTRITION DIAGNOSIS: Inadequate oral intake related to altered GI function as evidenced by NPO.   Goal: Pt will meet >90% of estimated nutritional needs  Monitor:  Diet advancement, PO intake, labs, weight changes, I/O's  Reason for Assessment: Consult to assess needs  80 y.o. female  Admitting Dx: GIB (gastrointestinal bleeding)  Destiny [name] is a 80 y.o. female with past medical history of duodenal ulcer, CAD status post CABG, CVA on Plavix and aspirin and chronic kidney disease who has had intermittent black tarry stool for the last 2 weeks. During that time, she is noted that she's become much more easily winded with any kind of exertion and even a little lightheaded. She's not had any nausea vomiting. Has noted some generalized nonspecific abdominal tenderness. She has been seeing her primary care physician several times for related issues as well as a UTI.  ASSESSMENT: Pt admitted with GIB.  Pt unavailable at time of visit, as she is down for enteroscopy due to melena.  Chart reviewed. Noted progressive wt loss over the past year, however not significant for time frame. UBW 170#.  Suspect poor po intake PTA due to GI issues. Cl:18, BUN/Creat: 49/1.26, Glucose: 106. CBGS: 114-118. K, Mg, and Phos WDL.   Height: Ht Readings from Last 1 Encounters:  12/28/13 5\' 6"  (1.676 m)    Weight: Wt Readings from Last 1 Encounters:  12/29/13 156 lb (70.761 kg)    Ideal Body Weight: 130#  % Ideal Body Weight: 120%  Wt Readings from Last 20 Encounters:  12/29/13 156 lb (70.761 kg)  12/29/13 156 lb (70.761 kg)  12/22/13 154 lb 12.8 oz (70.217 kg)  12/13/13 155 lb 2 oz (70.364 kg)  09/13/13 160 lb 4 oz (72.689 kg)  06/27/13 161 lb (73.029 kg)  06/09/13 161 lb 1.9 oz (73.084 kg)  04/05/13 162 lb 0.6  oz (73.501 kg)  03/27/13 161 lb (73.029 kg)  03/07/13 165 lb (74.844 kg)  01/27/13 170 lb (77.111 kg)  01/24/13 169 lb 6.4 oz (76.839 kg)  01/19/13 166 lb 1.3 oz (75.333 kg)  12/06/12 166 lb 1.3 oz (75.333 kg)  11/16/12 162 lb 4.1 oz (73.6 kg)  11/07/12 164 lb 8 oz (74.617 kg)  10/27/12 163 lb (73.936 kg)  10/25/12 163 lb 8 oz (74.163 kg)  09/05/12 170 lb 0.6 oz (77.13 kg)  07/28/12 170 lb (77.111 kg)   Usual Body Weight: 170#  % Usual Body Weight: 109%  BMI:  Body mass index is 25.19 kg/(m^2). Overweight  Estimated Nutritional Needs: Kcal: 1800-2000 Protein: 85-95 grams Fluid: 1.8-2.0 L  Skin: Intact  Diet Order: NPO  EDUCATION NEEDS: -Education not appropriate at this time   Intake/Output Summary (Last 24 hours) at 12/29/13 0940 Last data filed at 12/29/13 0530  Gross per 24 hour  Intake 1122.5 ml  Output      0 ml  Net 1122.5 ml    Last BM: 12/28/13  Labs:   Recent Labs Lab 12/22/13 1213 12/25/13 1108 12/28/13 0533 12/28/13 1210 12/29/13 0810  NA 132* 133* 140  --  142  K 5.2* 4.5 4.9  --  4.3  CL 107 102 106  --  112  CO2 13* 20 20  --  18*  BUN 88* 78* 79*  --  49*  CREATININE 1.9* 2.0* 1.61*  --  1.26*  CALCIUM 9.7 9.6 9.1  --  9.0  MG  --   --   --  2.0  --   PHOS 3.9 4.0  --   --   --   GLUCOSE 177* 167* 221*  --  106*    CBG (last 3)   Recent Labs  12/29/13 0148 12/29/13 0405 12/29/13 0813  GLUCAP 118* 114* 117*   Lab Results  Component Value Date   HGBA1C 8.6* 09/13/2013   Scheduled Meds: . sodium chloride   Intravenous Once  . Influenza vac split quadrivalent PF  0.5 mL Intramuscular Tomorrow-1000  . insulin aspart  0-9 Units Subcutaneous 6 times per day  . insulin glargine  5 Units Subcutaneous QHS  . pantoprazole (PROTONIX) IV  40 mg Intravenous Q24H  . sodium chloride  3 mL Intravenous Q12H    Continuous Infusions: . sodium chloride 1,000 mL (12/29/13 [id number])    Past Medical History  Diagnosis Date  . PVD  (peripheral vascular disease)   . CAD (coronary artery disease)     a. s/p CABG x 4 (LIMA->LAD, VG->Diag, VG->OM1->OM3);  b. 2010 Cath: 3/4 patent grafts (VG->diag occluded), 3vd, sev PVD ->Med Rx;  c. 12/2011 Lexi MV: sm area of mild mid and apical ant wall ischemia ->med rx.  . Hypertension   . Hyperlipidemia   . Duodenal ulcer 2009 and 11/2011    caused GI bleeding  . Iron deficiency anemia secondary to blood loss (chronic)   . Amaurosis fugax   . Carotid bruit   . Diabetes mellitus type II     insulin dependent.   . Dementia     pt denies  . Diabetic retinopathy   . Gallstones 2009    seen on CT scan  . Diverticulosis   . Atherosclerosis   . Chronic kidney disease (CKD) stage G4/A1, severely decreased glomerular filtration rate (GFR) between 15-29 mL/min/1.73 square meter and albuminuria creatinine ratio less than 30 mg/g     GFR 25 on 12/25/13  . CVA (cerebral infarction)   . Diabetic peripheral neuropathy   . GI bleed 04/2011, 11/2011    found non-bleeding duodenal ulcers  . Arthritis 12/06/2012  . Hypersomnolence 03/12/2013  . Acute renal insufficiency 12/24/2013  . Depression 12/24/2013  . Melena 1015/2015    PEPTIC ULCER    REQUIRING TRANSFUSION    Past Surgical History  Procedure Laterality Date  . Coronary artery bypass graft  1999    x 4  . Abdominal hysterectomy    . Upper gastrointestinal endoscopy  02/22/2008    w/biopsy, duedenal ulcer, antrum erosions  . Esophagogastroduodenoscopy  05/16/2011    Procedure: ESOPHAGOGASTRODUODENOSCOPY (EGD);  Surgeon: Gatha Mayer, MD;  Location: [facility] ENDOSCOPY;  Service: Endoscopy;  Laterality: N/A;  . Colonoscopy  05/20/2011    Procedure: COLONOSCOPY;  Surgeon: Scarlette Shorts, MD;  Location: [place];  Service: Endoscopy;  Laterality: N/A;  . Esophagogastroduodenoscopy  12/08/2011    Procedure: ESOPHAGOGASTRODUODENOSCOPY (EGD);  Surgeon: Ladene Artist, MD,FACG;  Location: [facility] ENDOSCOPY;  Service: Endoscopy;  Laterality: N/A;     Olman Yono A. Jimmye Norman, RD, LDN Pager: [phone] After hours Pager: [phone]

## 2013-12-29 NOTE — Progress Notes (Signed)
Pts manuel BP is 110/30. On call provider notified. No new orders given.

## 2013-12-29 NOTE — Anesthesia Preprocedure Evaluation (Addendum)
Anesthesia Evaluation  Patient identified by MRN, date of birth, ID band Patient awake    Reviewed: Allergy & Precautions, H&P , NPO status , Patient's Chart, lab work & pertinent test results  History of Anesthesia Complications Negative for: history of anesthetic complications  Airway Mallampati: II TM Distance: >3 FB Neck ROM: Full    Dental  (+) Upper Dentures, Lower Dentures   Pulmonary neg shortness of breath, neg COPDneg recent URI, former smoker,          Cardiovascular hypertension, Pt. on medications - angina+ CAD, + CABG and + Peripheral Vascular Disease - CHF Rhythm:Regular     Neuro/Psych PSYCHIATRIC DISORDERS Depression  Neuromuscular disease CVA, No Residual Symptoms    GI/Hepatic PUD, Gi bleed   Endo/Other  diabetes, Type 2, Insulin Dependent  Renal/GU Renal InsufficiencyRenal disease     Musculoskeletal  (+) Arthritis -,   Abdominal   Peds  Hematology  (+) anemia ,   Anesthesia Other Findings   Reproductive/Obstetrics                          Anesthesia Physical Anesthesia Plan  ASA: III  Anesthesia Plan: MAC   Post-op Pain Management:    Induction: Intravenous  Airway Management Planned: Simple Face Mask  Additional Equipment: None  Intra-op Plan:   Post-operative Plan:   Informed Consent: I have reviewed the patients History and Physical, chart, labs and discussed the procedure including the risks, benefits and alternatives for the proposed anesthesia with the patient or authorized representative who has indicated his/her understanding and acceptance.   Dental advisory given  Plan Discussed with: CRNA and Anesthesiologist  Anesthesia Plan Comments:        Anesthesia Quick Evaluation

## 2013-12-29 NOTE — Transfer of Care (Signed)
Immediate Anesthesia Transfer of Care Note  Patient: Destiny [name]  Procedure(s) Performed: Procedure(s): ENTEROSCOPY (N/A)  Patient Location: Endoscopy Unit  Anesthesia Type:MAC  Level of Consciousness: awake and sedated  Airway & Oxygen Therapy: Patient Spontanous Breathing and Patient connected to nasal cannula oxygen  Post-op Assessment: Report given to PACU RN and Post -op Vital signs reviewed and stable  Post vital signs: Reviewed and stable  Complications: No apparent anesthesia complications

## 2013-12-29 NOTE — Anesthesia Procedure Notes (Signed)
Procedure Name: MAC Date/Time: 12/29/2013 2:18 PM Performed by: Kyung Rudd Pre-anesthesia Checklist: Patient identified, Emergency Drugs available, Suction available, Patient being monitored and Timeout performed Patient Re-evaluated:Patient Re-evaluated prior to inductionOxygen Delivery Method: Simple face mask Preoxygenation: Pre-oxygenation with 100% oxygen Intubation Type: IV induction Placement Confirmation: positive ETCO2

## 2013-12-29 NOTE — Progress Notes (Signed)
[facility] PROGRESS NOTE   Destiny [name] [id number] DOB: [birth year] DOA: 12/28/2013 PCP: Penni Homans, MD  HPI/Subjective: Denies any bowel movement since yesterday.  Assessment/Plan: Principal Problem:   GIB (gastrointestinal bleeding) Active Problems:   Hyperlipidemia   Essential hypertension   Diabetes mellitus type 2, insulin dependent   Dementia   Bradycardia   CAD (coronary artery disease)   DM type 2, uncontrolled, with renal complications   Melena   CKD (chronic kidney disease) stage 4, GFR 15-29 ml/min   Acute blood loss anemia   History of CVA (cerebrovascular accident)   Chronic diastolic heart failure   Overweight (BMI 25.0-29.9)  . GIB (gastrointestinal bleeding):  -Hematochezia, patient is on aspirin Plavix, both on hold. -Gastroenterology consulted, patient for enteroscopy later today. -Patient started on PPI  . Acute blood loss anemia: -Baseline hemoglobin is 12.2 in 9/30, consistently declining since then. -Patient presented with hemoglobin of 7.9, status post transfusion of 2 units. -Hemoglobin stable at 9.7.  . Bradycardia:  -Looks to be a chronic process. TSH checked last week was stable.  -Echo ordered, this is might be contributing to her fatigability. Hold the Aricept which can cause bradycardia.  . Dementia:  -Holding patient's Aricept.   Marland Kitchen CAD (coronary artery disease):  -Stable for now. Patient with no chest pain.   . Essential hypertension:  -Blood pressure actually stable. Hold antihypertensives.   . Hyperlipidemia: Stable. Hold statin.   . CKD (chronic kidney disease) stage 4, GFR 15-29 ml/min:  -Looks to be at baseline. Creatinine continued to improve at 1.26. -Be careful as lower than usual creatinine can mean fluid overload.  . DM type 2, uncontrolled, with renal complications:  Every 4 hours sliding scale plus Lantus 5 units only   . Chronic diastolic heart failure:  -Looking at patient's previous  echocardiogram, done in 2009, she actually has chronic diastolic heart failure.  -Given drop in volumes are low crit aggressive fluid resuscitation, we'll check BNP and repeat 2 D Echo.   Overweight:  -Patient needs criteria with BMI greater than 25. Noted low albumin, more so as of late. Will ask nutrition to see.   Code Status: Full code Family Communication: Plan discussed with the patient. Disposition Plan: Remains inpatient   Consultants:  GI  Procedures:  Number  Antibiotics:  None   Objective: Filed Vitals:   12/29/13 1230  BP: 157/46  Pulse:   Temp: 98.4 F (36.9 C)  Resp:     Intake/Output Summary (Last 24 hours) at 12/29/13 1420 Last data filed at 12/29/13 0530  Gross per 24 hour  Intake    735 ml  Output      0 ml  Net    735 ml   Filed Weights   12/28/13 0536 12/28/13 0808 12/29/13 0530  Weight: 70.308 kg (155 lb) 72.394 kg (159 lb 9.6 oz) 70.761 kg (156 lb)    Exam: General: Alert and awake, oriented x3, not in any acute distress. HEENT: anicteric sclera, pupils reactive to light and accommodation, EOMI CVS: S1-S2 clear, no murmur rubs or gallops Chest: clear to auscultation bilaterally, no wheezing, rales or rhonchi Abdomen: soft nontender, nondistended, normal bowel sounds, no organomegaly Extremities: no cyanosis, clubbing or edema noted bilaterally Neuro: Cranial nerves II-XII intact, no focal neurological deficits  Data Reviewed: Basic Metabolic Panel:  Recent Labs Lab 12/25/13 1108 12/28/13 0533 12/28/13 1210 12/29/13 0810  NA 133* 140  --  142  K 4.5 4.9  --  4.3  CL  102 106  --  112  CO2 20 20  --  18*  GLUCOSE 167* 221*  --  106*  BUN 78* 79*  --  49*  CREATININE 2.0* 1.61*  --  1.26*  CALCIUM 9.6 9.1  --  9.0  MG  --   --  2.0  --   PHOS 4.0  --   --   --    Liver Function Tests:  Recent Labs Lab 12/25/13 1108 12/28/13 0533  AST  --  13  ALT  --  11  ALKPHOS  --  52  BILITOT  --  0.3  PROT  --  6.3  ALBUMIN  3.4* 3.0*   No results found for this basename: LIPASE, AMYLASE,  in the last 168 hours No results found for this basename: AMMONIA,  in the last 168 hours CBC:  Recent Labs Lab 12/25/13 1108 12/28/13 0533 12/28/13 1931 12/29/13 0810  WBC 7.0 11.2*  --  8.8  NEUTROABS  --  7.8*  --   --   HGB 10.6* 7.9* 7.4* 9.7*  HCT 31.6* 23.1* 20.9* 28.2*  MCV 91.1 88.8  --  88.1  PLT 295.0 242  --  158   Cardiac Enzymes: No results found for this basename: CKTOTAL, CKMB, CKMBINDEX, TROPONINI,  in the last 168 hours BNP (last 3 results)  Recent Labs  12/28/13 1210  PROBNP 241.5   CBG:  Recent Labs Lab 12/29/13 0003 12/29/13 0148 12/29/13 0405 12/29/13 0813 12/29/13 1140  GLUCAP 126* 118* 114* 117* 184*    Micro No results found for this or any previous visit (from the past 240 hour(s)).   Studies: No results found.  Scheduled Meds: . [MAR HOLD] sodium chloride   Intravenous Once  . [MAR HOLD] insulin aspart  0-9 Units Subcutaneous 6 times per day  . [MAR HOLD] insulin glargine  5 Units Subcutaneous QHS  . [MAR HOLD] pantoprazole (PROTONIX) IV  40 mg Intravenous Q24H  . [facility] HOLD] sodium chloride  3 mL Intravenous Q12H   Continuous Infusions: . sodium chloride 1,000 mL (12/29/13 0824)       Time spent: 35 minutes    [facility] A  [facility] Pager [phone] If 7PM-7AM, please contact night-coverage at [email or web address], password [facility] 12/29/2013, 2:20 PM  LOS: 1 day

## 2013-12-29 NOTE — Op Note (Signed)
[facility] [place], [id number]   ENTEROSCOPY PROCEDURE REPORT     EXAM DATE: 12/29/2013  PATIENT NAME:      Destiny [name], Destiny [name]           MR #:      [id number]  BIRTHDATE:       1931/06/23      VISIT #:     [id number] ATTENDING:     Inda Castle, MD     STATUS:     inpatient ASSISTANT:      Luanne Bras and Freda Munro MD: Willette Alma, M.D. ASA CLASS:        Class II  INDICATIONS:  The patient is a 80 yr old female here for an enteroscopy procedure due to melenic bleeding. PROCEDURE PERFORMED:     Small bowel enteroscopy with biopsy  MEDICATIONS:     Monitored anesthesia care  CONSENT: The patient understands the risks and benefits of the procedure and understands that these risks include, but are not limited to: sedation, allergic reaction, infection, perforation and/or bleeding. Alternative means of evaluation and treatment include, among others: physical exam, x-rays, and/or surgical intervention. The patient elects to proceed with this endoscopic procedure.  DESCRIPTION OF PROCEDURE: During intra-op preparation period all mechanical & medical equipment was checked for proper function. Hand hygiene and appropriate measures for infection prevention was taken. After the risks, benefits and alternatives of the procedure were thoroughly explained, Informed consent was verified, confirmed and timeout was successfully executed by the treatment team. The Pentax VSB-2900 endoscope was introduced through the mouth and advanced to the proximal jejunum jejunum. The prep was   . The instrument was then slowly withdrawn while examining the mucosa circumferentially. The scope was then completely withdrawn from the patient and the procedure terminated. The pulse, BP, and O2 saturation were monitored and documented by the physician and the nursing staff throughout the entire procedure.  The patient was cared for as planned  according to standard protocol, then discharged to recovery in stable condition and with appropriate post procedure care.    DUODENUM: A single non-bleeding, clean-based and deep ulcer measuring 8 x 63mm in size with surrounding edema was found in the duodenal bulb.  Biopsies were taken.  JEJUNUM: The exam showed no abnormalities in the jejunum.  ESOPHAGUS: The esophagus and gastroesophageal junction were completely normal in appearance.  STOMACH: The stomach was entered and closely examined.  the antrum, angularis, and lesser curvature were well visualized, including a retroflexed view of the cardia and fundus.  The stomach wall was normally distensable.  The scope passed easily through the pylorus into the duodenum and into the proximal jejunum (212cm from incisors).  The jejunum was entirely normal.    ADVERSE EVENTS:      There were no immediate complications.  IMPRESSIONS:     1.  Single ulcer measuring 8 x 35mm in size was found in the duodenal bulb; biopsies were taken 2.  The exam showed no abnormalities in the jejunum 3.  Normal appearing esophagus and GE junction 4.  The stomach was well visualized and normal in appearance  RECOMMENDATIONS:     1.  await  biopsies-rule out H. pyloric 2.  twice a day PPI therapy 3.  avoid ASA and NSAIDs RECALL:  _____________________________ Inda Castle, MD eSigned:  Inda Castle, MD 12/29/2013 3:00 PM   cc:     PATIENT NAME:  Destiny [name], Destiny [name] MR#: [id number]

## 2013-12-30 LAB — TYPE AND SCREEN
ABO/RH(D): A POS
Antibody Screen: NEGATIVE
UNIT DIVISION: 0
Unit division: 0

## 2013-12-30 LAB — GLUCOSE, CAPILLARY
Glucose-Capillary: 120 mg/dL — ABNORMAL HIGH (ref 70–99)
Glucose-Capillary: 157 mg/dL — ABNORMAL HIGH (ref 70–99)
Glucose-Capillary: 163 mg/dL — ABNORMAL HIGH (ref 70–99)
Glucose-Capillary: 169 mg/dL — ABNORMAL HIGH (ref 70–99)

## 2013-12-30 MED ORDER — PANTOPRAZOLE SODIUM 40 MG PO TBEC: 40.0000 mg | DELAYED_RELEASE_TABLET | Freq: Two times a day (BID) | ORAL | Status: DC

## 2013-12-30 MED ORDER — PANTOPRAZOLE SODIUM 40 MG PO TBEC
40.0000 mg | DELAYED_RELEASE_TABLET | Freq: Two times a day (BID) | ORAL | Status: DC
  Administered 2013-12-30: 40 mg via ORAL
  Filled 2013-12-30: qty 1

## 2013-12-30 NOTE — Progress Notes (Signed)
Daily Rounding Note  12/30/2013, 8:23 AM  LOS: 2 days   SUBJECTIVE:       No bloody or black stools.  No nausea, not dizzy when goes to bathroom.  Feels much better and ready to go home if allowed.  Eating well.   OBJECTIVE:         Vital signs in last 24 hours:    Temp:  [97.9 F (36.6 C)-98.8 F (37.1 C)] 98.8 F (37.1 C) (10/17 0705) Pulse Rate:  [34-56] 52 (10/17 0705) Resp:  [14-20] 16 (10/17 0705) BP: (102-157)/(35-80) 132/35 mmHg (10/17 0705) SpO2:  [89 %-100 %] 98 % (10/17 0705) Weight:  [154 lb 15.7 oz (70.3 kg)] 154 lb 15.7 oz (70.3 kg) (10/17 0659) Last BM Date: 12/29/13 Filed Weights   12/28/13 0808 12/29/13 0530 12/30/13 0659  Weight: 159 lb 9.6 oz (72.394 kg) 156 lb (70.761 kg) 154 lb 15.7 oz (70.3 kg)   General: looks well.  comfortable   Heart: RRR Chest: clear bil.  No cough or labored resps. Abdomen: soft, NT, ND.  No mass or HSM.  Extremities: no CCE Neuro/Psych:  Relaxed, fullly alert, oriented to place and self but inaccurate on date.  She is appropriate.   Intake/Output from previous day: 10/16 0701 - 10/17 0700 In: 300 [I.V.:300] Out: -   Intake/Output this shift:    Lab Results:  Recent Labs  12/28/13 0533 12/28/13 1931 12/29/13 0810  WBC 11.2*  --  8.8  HGB 7.9* 7.4* 9.7*  HCT 23.1* 20.9* 28.2*  PLT 242  --  158   BMET  Recent Labs  12/28/13 0533 12/29/13 0810  NA 140 142  K 4.9 4.3  CL 106 112  CO2 20 18*  GLUCOSE 221* 106*  BUN 79* 49*  CREATININE 1.61* 1.26*  CALCIUM 9.1 9.0   LFT  Recent Labs  12/28/13 0533  PROT 6.3  ALBUMIN 3.0*  AST 13  ALT 11  ALKPHOS 52  BILITOT 0.3   PT/INR  Recent Labs  12/28/13 0625  LABPROT 16.0*  INR 1.27    Scheduled Meds: . sodium chloride   Intravenous Once  . insulin aspart  0-9 Units Subcutaneous 6 times per day  . insulin glargine  5 Units Subcutaneous QHS  . pantoprazole (PROTONIX) IV  40 mg Intravenous  Q24H  . sodium chloride  3 mL Intravenous Q12H   Continuous Infusions:  PRN Meds:.ondansetron (ZOFRAN) IV, ondansetron   ASSESMENT:   * GI bleeding: intermittent tarry stools and FOBT +. But also some more red blood per hx and per rectal exam today.  Hx of duodenal ulcers in 2009 and 2013.  Capsule endo normal 06/2011.  Tics on colon in 05/2011.  Compliant with daily PPI.   10/16 Enteroscopy demonstrated an active ulcer at the junction between the bulb and the second portion of the duodenum.  * ABL anemia. Hx of chronic anemia but well controlled with Hgb of 12 from 05/2013 - 11/2011. On B12 and po iron at home. MCV normocytic.  PRBC x 2 thus far. Hgb improved.   * Chronic low dose ASA and Plavix.  Wonder if it is time to discontinue the ASA?   * Chronic kidney disease. Stage 4 by most recent GFR of 25 on 10/12. Suspect some of the BUN elevation is due to GI bleeding induced azotemia. BUN/creatinine dropped within first 24 hours of admission.    * Type 2 DM, insulin requiring.  PLAN   *  Consider discontinuation of ASA 81, currently on hold along with Plavix.   *  Switch to BID oral PPI.  Long term oral Iron and B12 as prior to admission.  *  Await duodenal bx path, but previously Clotest negative.  If H Pylori present, will call pt with appropriate Rx.   *  Ok to discharge home, preferably off ASA.  Follow up CBC within 10 days with Dr Penni Homans.  Will d/w MD if pt needs seen back by GI or see PRN.     Destiny [name]  12/30/2013, 8:23 AM Pager: [phone]  GI Attending Note  I have personally taken an interval history, reviewed the chart, and examined the patient.  I agree with the extender's note, impression and recommendations.  Sandy Salaam. Deatra Ina, MD, [facility] [phone]

## 2013-12-30 NOTE — Discharge Summary (Signed)
Physician Discharge Summary  Destiny [name] [id number] DOB: 08-10-1931 DOA: 12/28/2013  PCP: Penni Homans, MD  Admit date: 12/28/2013 Discharge date: 12/30/2013  Time spent: 40 minutes  Recommendations for Outpatient Follow-up:  1. Followup with Dr. [name] within one week. 2. Check CBC within one week, follow on PUD biopsy.  Discharge Diagnoses:  Principal Problem:   GIB (gastrointestinal bleeding) Active Problems:   Hyperlipidemia   Essential hypertension   Diabetes mellitus type 2, insulin dependent   Dementia   Bradycardia   CAD (coronary artery disease)   DM type 2, uncontrolled, with renal complications   Melena   CKD (chronic kidney disease) stage 4, GFR 15-29 ml/min   Acute blood loss anemia   History of CVA (cerebrovascular accident)   Chronic diastolic heart failure   Overweight (BMI 25.0-29.9)   Duodenal ulcer with hemorrhage   Discharge Condition: Stable  Diet recommendation: Our modified  Filed Weights   12/28/13 0808 12/29/13 0530 12/30/13 0659  Weight: 72.394 kg (159 lb 9.6 oz) 70.761 kg (156 lb) 70.3 kg (154 lb 15.7 oz)    History of present illness:  Destiny [name] is a 80 y.o. female  With past medical history of duodenal ulcer, CAD status post CABG, CVA on Plavix and aspirin and chronic kidney disease who has had intermittent black tarry stool for the last 2 weeks. During that time, she is noted that she's become much more easily winded with any kind of exertion and even a little lightheaded. She's not had any nausea vomiting. Has noted some generalized nonspecific abdominal tenderness. She has been seeing her primary care physician several times for related issues as well as a UTI and it was noted that her hemoglobin was at baseline of 12.2 two weeks ago and dropped to 11.1 four days ago, then 10.6 three days ago. Today she came into the emergency room and was noted to have a hemoglobin of 7.9. Her white count was mildly elevated at 11.2.  Interestingly, patient's creatinine was actually improved from previous at 1.61 today. Patient has a history of chronic bradycardia, but she was not noted to be hypotensive. She was ordered a blood transfusion in hospitalists were called for further evaluation and admission.  Hospital Course:   . GIB (gastrointestinal bleeding):  -Hematochezia, patient is on aspirin Plavix, both on hold.  -Gastroenterology consulted, patient undergone enteroscopy on 12/29/13 -Enteroscopy showed ulcer measures 8x5 mm in the duodenal bulb. -Gastroenterology recommended to discontinue aspirin, avoid NSAIDs, PPI for one month twice a day. -Recommended no further interventions patient can be discharged home.  . Acute blood loss anemia:  -Baseline hemoglobin is 12.2 in 9/30, consistently declining since then.  -Patient presented with hemoglobin of 7.9, status post transfusion of 2 units.  -Hemoglobin stable at 9.7. Post discharge, please check CBC within one week.  . Bradycardia:  -Looks to be a chronic process. TSH checked last week was stable.  -If patient continues to of fatigability because of bradycardia consider outpatient referral to cardiology.  . Dementia:  -Patient Aricept held on admission and restarted on the time of discharge.  Marland Kitchen CAD (coronary artery disease):  -Stable for now. Patient with no chest pain.   . Essential hypertension:  -Blood pressure actually stable. Antihypertensives held on admission and restarted on discharge.  . Hyperlipidemia: Stable. Hold statin.   . CKD (chronic kidney disease) stage 4, GFR 15-29 ml/min:  -Looks to be at baseline. Creatinine continued to improve at 1.26.  -Restarted back on her home  medications on discharge.  . DM type 2, uncontrolled, with renal complications:  -Sliding scale we'll utilize while she was in the hospital, discharge restarted back on home medications.  . Chronic diastolic heart failure:  -Looking at patient's previous echocardiogram,  done in 2009, she actually has chronic diastolic heart failure.  -No signs or symptoms suggesting decompensation, BNP is 241.  Overweight:  -Patient needs criteria with BMI greater than 25. Noted low albumin, more so as of late. Will ask nutrition to see.   Procedures:  Small bowel enteroscopy with biopsy using MAC showed: 1. Single ulcer measuring 8 x 76mm in size was found in the duodenal bulb; biopsies were taken.  2. The exam showed no abnormalities in the jejunum  3. Normal appearing esophagus and GE junction  4. The stomach was well visualized and normal in appearance   Consultations:  Gastroenterology  Discharge Exam: Filed Vitals:   12/30/13 0705  BP: 132/35  Pulse: 52  Temp: 98.8 F (37.1 C)  Resp: 16   General: Alert and awake, oriented x3, not in any acute distress. HEENT: anicteric sclera, pupils reactive to light and accommodation, EOMI CVS: S1-S2 clear, no murmur rubs or gallops Chest: clear to auscultation bilaterally, no wheezing, rales or rhonchi Abdomen: soft nontender, nondistended, normal bowel sounds, no organomegaly Extremities: no cyanosis, clubbing or edema noted bilaterally Neuro: Cranial nerves II-XII intact, no focal neurological deficits   Discharge Instructions You were cared for by a hospitalist during your hospital stay. If you have any questions about your discharge medications or the care you received while you were in the hospital after you are discharged, you can call the unit and asked to speak with the hospitalist on call if the hospitalist that took care of you is not available. Once you are discharged, your primary care physician will handle any further medical issues. Please note that NO REFILLS for any discharge medications will be authorized once you are discharged, as it is imperative that you return to your primary care physician (or establish a relationship with a primary care physician if you do not have one) for your aftercare needs  so that they can reassess your need for medications and monitor your lab values.  Discharge Instructions   Diet Carb Modified    Complete by:  As directed      Increase activity slowly    Complete by:  As directed           Current Discharge Medication List    CONTINUE these medications which have CHANGED   Details  pantoprazole (PROTONIX) 40 MG tablet Take 1 tablet (40 mg total) by mouth 2 (two) times daily before a meal. Qty: 60 tablet, Refills: 0      CONTINUE these medications which have NOT CHANGED   Details  amLODipine (NORVASC) 10 MG tablet Take 10 mg by mouth daily.    Cholecalciferol (VITAMIN D) 1000 UNITS capsule Take 1,000 Units by mouth every evening.     citalopram (CELEXA) 10 MG tablet Take 1 tablet (10 mg total) by mouth daily. Qty: 30 tablet, Refills: 3   Associated Diagnoses: Depression    clopidogrel (PLAVIX) 75 MG tablet Take 75 mg by mouth daily.    colesevelam (WELCHOL) 625 MG tablet Take 625 mg by mouth 2 (two) times daily with a meal.    donepezil (ARICEPT) 10 MG tablet Take 10 mg by mouth at bedtime.    Ferrous Sulfate (IRON) 325 (65 FE) MG TABS Take 325 mg by  mouth 2 (two) times daily.     furosemide (LASIX) 20 MG tablet Take 20 mg by mouth daily.    Glucosamine-Chondroit-Vit C-Mn (GLUCOSAMINE CHONDR 500 COMPLEX) CAPS Take 1 capsule by mouth daily.     insulin glargine (LANTUS) 100 UNIT/ML injection Inject 40 Units into the skin at bedtime.    insulin lispro (HUMALOG KWIKPEN) 100 UNIT/ML KiwkPen Inject 0.05-0.1 mLs (5-10 Units total) into the skin 3 (three) times daily with meals. Qty: 15 mL, Refills: 4    isosorbide mononitrate (IMDUR) 60 MG 24 hr tablet Take 60 mg by mouth daily.    lovastatin (MEVACOR) 20 MG tablet Take 1 tablet (20 mg total) by mouth at bedtime. Qty: 90 tablet, Refills: 3   Associated Diagnoses: Abdominal pain, unspecified abdominal location; Diarrhea; Type 2 diabetes mellitus with hyperglycemia; Essential hypertension     nitroGLYCERIN (NITROSTAT) 0.4 MG SL tablet Place 0.4 mg under the tongue every 5 (five) minutes as needed for chest pain.    Omega-3 Fatty Acids (FISH OIL) 1200 MG CAPS Take 1,200 mg by mouth daily.     quinapril (ACCUPRIL) 10 MG tablet Take 10 mg by mouth daily.    triamterene-hydrochlorothiazide (MAXZIDE-25) 37.5-25 MG per tablet Take 0.5 tablets by mouth daily. Qty: 30 tablet, Refills: 3   Associated Diagnoses: Acute cystitis with hematuria    vitamin B-12 (CYANOCOBALAMIN) 1000 MCG tablet Take 1,000 mcg by mouth daily.      STOP taking these medications     aspirin 81 MG tablet      cefdinir (OMNICEF) 300 MG capsule        No Known Allergies Follow-up Information   Follow up with Penni Homans, MD In 1 week.   Specialty:  Family Medicine   Contact information:   [place] [address] [phone]        The results of significant diagnostics from this hospitalization (including imaging, microbiology, ancillary and laboratory) are listed below for reference.    Significant Diagnostic Studies: No results found.  Microbiology: No results found for this or any previous visit (from the past 240 hour(s)).   Labs: Basic Metabolic Panel:  Recent Labs Lab 12/25/13 1108 12/28/13 0533 12/28/13 1210 12/29/13 0810  NA 133* 140  --  142  K 4.5 4.9  --  4.3  CL 102 106  --  112  CO2 20 20  --  18*  GLUCOSE 167* 221*  --  106*  BUN 78* 79*  --  49*  CREATININE 2.0* 1.61*  --  1.26*  CALCIUM 9.6 9.1  --  9.0  MG  --   --  2.0  --   PHOS 4.0  --   --   --    Liver Function Tests:  Recent Labs Lab 12/25/13 1108 12/28/13 0533  AST  --  13  ALT  --  11  ALKPHOS  --  52  BILITOT  --  0.3  PROT  --  6.3  ALBUMIN 3.4* 3.0*   No results found for this basename: LIPASE, AMYLASE,  in the last 168 hours No results found for this basename: AMMONIA,  in the last 168 hours CBC:  Recent Labs Lab 12/25/13 1108 12/28/13 0533  12/28/13 1931 12/29/13 0810  WBC 7.0 11.2*  --  8.8  NEUTROABS  --  7.8*  --   --   HGB 10.6* 7.9* 7.4* 9.7*  HCT 31.6* 23.1* 20.9* 28.2*  MCV 91.1 88.8  --  88.1  PLT 295.0 242  --  158   Cardiac Enzymes: No results found for this basename: CKTOTAL, CKMB, CKMBINDEX, TROPONINI,  in the last 168 hours BNP: BNP (last 3 results)  Recent Labs  12/28/13 1210  PROBNP 241.5   CBG:  Recent Labs Lab 12/29/13 2208 12/30/13 0010 12/30/13 0415 12/30/13 0754 12/30/13 1149  GLUCAP 160* 163* 157* 120* 169*       Signed:  Greysen Swanton A  [facility] 12/30/2013, 12:46 PM

## 2013-12-30 NOTE — Progress Notes (Signed)
12/30/13 Patient discharged from hospital home, IV site removed, and discharge instructions reviewed with patient.

## 2013-12-30 NOTE — Anesthesia Postprocedure Evaluation (Signed)
  Anesthesia Post-op Note  Patient: Destiny [name]  Procedure(s) Performed: Procedure(s): ENTEROSCOPY (N/A)  Patient Location: Endoscopy Unit  Anesthesia Type:MAC  Level of Consciousness: awake  Airway and Oxygen Therapy: Patient Spontanous Breathing  Post-op Pain: none  Post-op Assessment: Post-op Vital signs reviewed, Patient's Cardiovascular Status Stable, Respiratory Function Stable, Patent Airway, No signs of Nausea or vomiting and Pain level controlled  Post-op Vital Signs: Reviewed and stable  Last Vitals:  Filed Vitals:   12/30/13 0705  BP: 132/35  Pulse: 52  Temp: 37.1 C  Resp: 16    Complications: No apparent anesthesia complications

## 2014-01-01 ENCOUNTER — Encounter (HOSPITAL_COMMUNITY): Payer: Self-pay | Admitting: Gastroenterology

## 2014-01-01 NOTE — Progress Notes (Signed)
GI Attending Note  I have personally taken an interval history, reviewed the chart, and examined the patient.  I agree with the extender's note, impression and recommendations.  Kyjuan Gause D. Coy Rochford, MD, FACG [facility] [phone]  

## 2014-01-02 ENCOUNTER — Telehealth: Payer: Self-pay

## 2014-01-02 ENCOUNTER — Telehealth: Payer: Self-pay | Admitting: Family Medicine

## 2014-01-02 NOTE — Telephone Encounter (Signed)
Caller name: Ebany Ravindran Relation to pt: son Call back number: [phone] Pharmacy:  Reason for call: Son states that pt is having constant diarrhea, and mentioned that was hospitalized last week with the same symptoms. Pt had an internal bleeding when hospitalized. Son states pt has appt on Friday 10.23.15 hospital follow up wants to know if pt can wait for appt. Please advise.

## 2014-01-02 NOTE — Telephone Encounter (Signed)
Admit date: 12/28/2013  Discharge date: 12/30/2013  Recommendations for Outpatient Follow-up:  1. Followup with Dr. [name] within one week. 2. Check CBC within one week, follow on PUD biopsy.  Transition Care Management Follow-up Telephone Call  How have you been since you were released from the hospital?  Pt states she has been doing good since she has been home.  Denies abdominal pain, chest pain, shortness of breath, nausea/vomiting, dizziness, lightheadedness, or blood in stool.  Last BM this morning.  Described as normal.  She is no longer taking aspirin, but is taking her Plavix without difficulty.     Do you understand why you were in the hospital? yes   Do you understand the discharge instructions? yes  Items Reviewed:  Medications reviewed: yes  Allergies reviewed: yes  Dietary changes reviewed: yes  Referrals reviewed: n/a   Functional Questionnaire:   Activities of Daily Living (ADLs):   She states they are independent in the following: ambulation, bathing and hygiene, feeding and continence, grooming, toileting and dressing.   States they require assistance with the following: none   Any transportation issues/concerns?: yes   Any patient concerns? yes   Confirmed importance and date/time of follow-up visits scheduled: yes    Confirmed with patient if condition begins to worsen call PCP or go to the ER.  Patient was given the Call-a-Nurse line [phone]: yes  Hospital follow up appointment scheduled on 01/05/14 @ 10:30 am with Dr. [name].

## 2014-01-03 NOTE — Telephone Encounter (Signed)
I have the paperwork he left but it does not require any work from me, it is a form they fill out. [facility] is primarily a self referral agency. I can return form to them when they come in

## 2014-01-03 NOTE — Telephone Encounter (Signed)
Spoke with son who states that the symptoms are not nearly as severe as when she was hospitalized but had started with similar symptoms. Advised that if symptoms start to deteriorate that she should be taken to the ED as soon as possible. Mr Nerone states that they are watching her closely and agrees with plan.  Would also like to be sure that the "[facility]" paperwork was addressed. He left it the last time they were in (?). Lamount Cohen states that it should be with your paperwork.

## 2014-01-03 NOTE — Telephone Encounter (Signed)
If they just want to know if she is eligible for the [facility] program I believe she is.

## 2014-01-04 NOTE — Telephone Encounter (Signed)
Noted  

## 2014-01-05 ENCOUNTER — Ambulatory Visit (INDEPENDENT_AMBULATORY_CARE_PROVIDER_SITE_OTHER): Payer: Medicare Other | Admitting: Family Medicine

## 2014-01-05 ENCOUNTER — Other Ambulatory Visit: Payer: Self-pay | Admitting: Family Medicine

## 2014-01-05 ENCOUNTER — Encounter: Payer: Self-pay | Admitting: Family Medicine

## 2014-01-05 VITALS — BP 103/66 | HR 50 | Temp 97.8°F | Ht 66.0 in | Wt 151.8 lb

## 2014-01-05 DIAGNOSIS — F329 Major depressive disorder, single episode, unspecified: Secondary | ICD-10-CM

## 2014-01-05 DIAGNOSIS — R634 Abnormal weight loss: Secondary | ICD-10-CM

## 2014-01-05 DIAGNOSIS — N184 Chronic kidney disease, stage 4 (severe): Secondary | ICD-10-CM

## 2014-01-05 DIAGNOSIS — N289 Disorder of kidney and ureter, unspecified: Secondary | ICD-10-CM

## 2014-01-05 DIAGNOSIS — D500 Iron deficiency anemia secondary to blood loss (chronic): Secondary | ICD-10-CM

## 2014-01-05 DIAGNOSIS — R630 Anorexia: Secondary | ICD-10-CM

## 2014-01-05 DIAGNOSIS — F32A Depression, unspecified: Secondary | ICD-10-CM

## 2014-01-05 DIAGNOSIS — K264 Chronic or unspecified duodenal ulcer with hemorrhage: Secondary | ICD-10-CM

## 2014-01-05 DIAGNOSIS — I2510 Atherosclerotic heart disease of native coronary artery without angina pectoris: Secondary | ICD-10-CM

## 2014-01-05 LAB — RENAL FUNCTION PANEL
Albumin: 3 g/dL — ABNORMAL LOW (ref 3.5–5.2)
BUN: 38 mg/dL — ABNORMAL HIGH (ref 6–23)
CHLORIDE: 111 meq/L (ref 96–112)
CO2: 19 meq/L (ref 19–32)
Calcium: 9.1 mg/dL (ref 8.4–10.5)
Creatinine, Ser: 2.2 mg/dL — ABNORMAL HIGH (ref 0.4–1.2)
GFR: 22.67 mL/min — AB (ref >60.00)
Glucose, Bld: 78 mg/dL (ref 70–99)
PHOSPHORUS: 4.1 mg/dL (ref 2.3–4.6)
POTASSIUM: 3.8 meq/L (ref 3.5–5.1)
Sodium: 139 mEq/L (ref 135–145)

## 2014-01-05 LAB — CBC
HCT: 28.9 % — ABNORMAL LOW (ref 36.0–46.0)
HEMOGLOBIN: 9.8 g/dL — AB (ref 12.0–15.0)
MCHC: 34 g/dL (ref 30.0–36.0)
MCV: 90.6 fl (ref 78.0–100.0)
Platelets: 204 10*3/uL (ref 150.0–400.0)
RBC: 3.19 Mil/uL — ABNORMAL LOW (ref 3.87–5.11)
RDW: 15.3 % (ref 11.5–15.5)
WBC: 6.9 10*3/uL (ref 4.0–10.5)

## 2014-01-05 NOTE — Patient Instructions (Signed)
No Celexa/Citalopram or Plavix/Clopidegral over the weekendGastrointestinal Bleeding Gastrointestinal (GI) bleeding means there is bleeding somewhere along the digestive tract, between the mouth and anus. CAUSES  There are many different problems that can cause GI bleeding. Possible causes include:  Esophagitis. This is inflammation, irritation, or swelling of the esophagus.  Hemorrhoids.These are veins that are full of blood (engorged) in the rectum. They cause pain, inflammation, and may bleed.  Anal fissures.These are areas of painful tearing which may bleed. They are often caused by passing hard stool.  Diverticulosis.These are pouches that form on the colon over time, with age, and may bleed significantly.  Diverticulitis.This is inflammation in areas with diverticulosis. It can cause pain, fever, and bloody stools, although bleeding is rare.  Polyps and cancer. Colon cancer often starts out as precancerous polyps.  Gastritis and ulcers.Bleeding from the upper gastrointestinal tract (near the stomach) may travel through the intestines and produce black, sometimes tarry, often bad smelling stools. In certain cases, if the bleeding is fast enough, the stools may not be black, but red. This condition may be life-threatening. SYMPTOMS   Vomiting bright red blood or material that looks like coffee grounds.  Bloody, black, or tarry stools. DIAGNOSIS  Your caregiver may diagnose your condition by taking your history and performing a physical exam. More tests may be needed, including:  X-rays and other imaging tests.  Esophagogastroduodenoscopy (EGD). This test uses a flexible, lighted tube to look at your esophagus, stomach, and small intestine.  Colonoscopy. This test uses a flexible, lighted tube to look at your colon. TREATMENT  Treatment depends on the cause of your bleeding.   For bleeding from the esophagus, stomach, small intestine, or colon, the caregiver doing your EGD  or colonoscopy may be able to stop the bleeding as part of the procedure.  Inflammation or infection of the colon can be treated with medicines.  Many rectal problems can be treated with creams, suppositories, or warm baths.  Surgery is sometimes needed.  Blood transfusions are sometimes needed if you have lost a lot of blood. If bleeding is slow, you may be allowed to go home. If there is a lot of bleeding, you will need to stay in the hospital for observation. HOME CARE INSTRUCTIONS   Take any medicines exactly as prescribed.  Keep your stools soft by eating foods that are high in fiber. These foods include whole grains, legumes, fruits, and vegetables. Prunes (1 to 3 a day) work well for many people.  Drink enough fluids to keep your urine clear or pale yellow. SEEK IMMEDIATE MEDICAL CARE IF:   Your bleeding increases.  You feel lightheaded, weak, or you faint.  You have severe cramps in your back or abdomen.  You pass large blood clots in your stool.  Your problems are getting worse. MAKE SURE YOU:   Understand these instructions.  Will watch your condition.  Will get help right away if you are not doing well or get worse. Document Released: 02/28/2000 Document Revised: 02/17/2012 Document Reviewed: 02/09/2011 [facility] Patient Information 2015 [place], [place]. This information is not intended to replace advice given to you by your health care provider. Make sure you discuss any questions you have with your health care provider.

## 2014-01-05 NOTE — Progress Notes (Signed)
Pre visit review using our clinic review tool, if applicable. No additional management support is needed unless otherwise documented below in the visit note. 

## 2014-01-07 ENCOUNTER — Encounter: Payer: Self-pay | Admitting: Family Medicine

## 2014-01-07 NOTE — Assessment & Plan Note (Signed)
Denies CP, SOB or concerning symptoms despite recent GI bleed

## 2014-01-07 NOTE — Assessment & Plan Note (Signed)
Creatinine is up since hospitalization again, stopped Quinapril and Maxzide for now and encouraged increased hydration. Repeat renal panel this week.

## 2014-01-07 NOTE — Assessment & Plan Note (Signed)
Recurrent, heme positive in office. Referred back to gastroenterology, seek care if worsens

## 2014-01-07 NOTE — Assessment & Plan Note (Signed)
Son is with her today and reports her mood has improved since we started Citalopram but due to recent GI bleed will need to hold for now. Once she stabilizes we may need to reconsider restarting meds.

## 2014-01-07 NOTE — Progress Notes (Signed)
Patient ID: Destiny [name], female   DOB: June 21, 1931, 80 y.o.   MRN: [id number] LATUSHA RIZKALLAH [id number] 11/18/1931 01/07/2014      Progress Note-Follow Up  Subjective  Chief Complaint  Chief Complaint  Patient presents with  . Follow-up    hospital follow up    HPI Patient is a 80 year old female in today for routine medical care. Is in today for hospital follow up with her son. She notes a recurrence of dark sticky stool over  Past 3  Or so days. NO episodes today but 2 episodes daily in past couple of days. No BRBPR or abdominal pain, notes anorexia but denies n/v/diarrhea. No she is noting increased fatigue and sleeping more. Blood sugars running low due to poor po intake. Denies CP/palp/SOB/HA/congestion/fevers or GU c/o. Taking meds as prescribed  Past Medical History  Diagnosis Date  . PVD (peripheral vascular disease)   . CAD (coronary artery disease)     a. s/p CABG x 4 (LIMA->LAD, VG->Diag, VG->OM1->OM3);  b. 2010 Cath: 3/4 patent grafts (VG->diag occluded), 3vd, sev PVD ->Med Rx;  c. 12/2011 Lexi MV: sm area of mild mid and apical ant wall ischemia ->med rx.  . Hypertension   . Hyperlipidemia   . Duodenal ulcer 2009 and 11/2011    caused GI bleeding  . Iron deficiency anemia secondary to blood loss (chronic)   . Amaurosis fugax   . Carotid bruit   . Diabetes mellitus type II     insulin dependent.   . Dementia     pt denies  . Diabetic retinopathy   . Gallstones 2009    seen on CT scan  . Diverticulosis   . Atherosclerosis   . Chronic kidney disease (CKD) stage G4/A1, severely decreased glomerular filtration rate (GFR) between 15-29 mL/min/1.73 square meter and albuminuria creatinine ratio less than 30 mg/g     GFR 25 on 12/25/13  . CVA (cerebral infarction)   . Diabetic peripheral neuropathy   . GI bleed 04/2011, 11/2011    found non-bleeding duodenal ulcers  . Arthritis 12/06/2012  . Hypersomnolence 03/12/2013  . Acute renal insufficiency 12/24/2013  .  Depression 12/24/2013  . Melena 1015/2015    PEPTIC ULCER    REQUIRING TRANSFUSION    Past Surgical History  Procedure Laterality Date  . Coronary artery bypass graft  1999    x 4  . Abdominal hysterectomy    . Upper gastrointestinal endoscopy  02/22/2008    w/biopsy, duedenal ulcer, antrum erosions  . Esophagogastroduodenoscopy  05/16/2011    Procedure: ESOPHAGOGASTRODUODENOSCOPY (EGD);  Surgeon: Gatha Mayer, MD;  Location: [facility] ENDOSCOPY;  Service: Endoscopy;  Laterality: N/A;  . Colonoscopy  05/20/2011    Procedure: COLONOSCOPY;  Surgeon: Scarlette Shorts, MD;  Location: [place];  Service: Endoscopy;  Laterality: N/A;  . Esophagogastroduodenoscopy  12/08/2011    Procedure: ESOPHAGOGASTRODUODENOSCOPY (EGD);  Surgeon: Ladene Artist, MD,FACG;  Location: [facility] ENDOSCOPY;  Service: Endoscopy;  Laterality: N/A;  . Enteroscopy N/A 12/29/2013    Procedure: ENTEROSCOPY;  Surgeon: Inda Castle, MD;  Location: [facility];  Service: Endoscopy;  Laterality: N/A;    Family History  Problem Relation Age of Onset  . Coronary artery disease Father   . Hypertension Father   . Stroke Father   . Cancer Mother     ?  . Diabetes Maternal Uncle   . Colon cancer Neg Hx     History   Social History  . Marital Status: Widowed  Spouse Name: N/A    Number of Children: 4  . Years of Education: N/A   Occupational History  . retired    Social History Main Topics  . Smoking status: Former Smoker    Types: Cigarettes    Quit date: 12/31/2000  . Smokeless tobacco: Never Used  . Alcohol Use: No  . Drug Use: No  . Sexual Activity: Not on file   Other Topics Concern  . Not on file   Social History Narrative   Widowed - husband passed away in 19-May-2009   lives with daughter with epilepsy - mental retardation   Retired     Former Smoker      Alcohol use-no          Occupation: Retired       son - Eldine Liva     Current Outpatient Prescriptions on File Prior to Visit  Medication Sig  Dispense Refill  . ACCU-CHEK AVIVA PLUS test strip       . Alcohol Swabs (ALCOHOL PREP) 70 % PADS       . amLODipine (NORVASC) 10 MG tablet Take 10 mg by mouth daily.      . ASSURE COMFORT LANCETS 30G MISC       . Blood Glucose Calibration (ACCU-CHEK AVIVA) SOLN       . Cholecalciferol (VITAMIN D) 1000 UNITS capsule Take 1,000 Units by mouth every evening.       . colesevelam (WELCHOL) 625 MG tablet Take 625 mg by mouth 2 (two) times daily with a meal.      . donepezil (ARICEPT) 10 MG tablet Take 10 mg by mouth at bedtime.      . Ferrous Sulfate (IRON) 325 (65 FE) MG TABS Take 325 mg by mouth 2 (two) times daily.       . furosemide (LASIX) 20 MG tablet Take 20 mg by mouth daily.      . Glucosamine-Chondroit-Vit C-Mn (GLUCOSAMINE CHONDR 500 COMPLEX) CAPS Take 1 capsule by mouth daily.       . insulin glargine (LANTUS) 100 UNIT/ML injection Inject 40 Units into the skin at bedtime.      . insulin lispro (HUMALOG KWIKPEN) 100 UNIT/ML KiwkPen Inject 0.05-0.1 mLs (5-10 Units total) into the skin 3 (three) times daily with meals.  15 mL  4  . isosorbide mononitrate (IMDUR) 60 MG 24 hr tablet Take 60 mg by mouth daily.      Marland Kitchen lovastatin (MEVACOR) 20 MG tablet Take 1 tablet (20 mg total) by mouth at bedtime.  90 tablet  3  . nitroGLYCERIN (NITROSTAT) 0.4 MG SL tablet Place 0.4 mg under the tongue every 5 (five) minutes as needed for chest pain.      . Omega-3 Fatty Acids (FISH OIL) 1200 MG CAPS Take 1,200 mg by mouth daily.       . pantoprazole (PROTONIX) 40 MG tablet Take 1 tablet (40 mg total) by mouth 2 (two) times daily before a meal.  60 tablet  0  . quinapril (ACCUPRIL) 10 MG tablet Take 10 mg by mouth daily.      Marland Kitchen triamterene-hydrochlorothiazide (MAXZIDE-25) 37.5-25 MG per tablet Take 0.5 tablets by mouth daily.  30 tablet  3  . vitamin B-12 (CYANOCOBALAMIN) 1000 MCG tablet Take 1,000 mcg by mouth daily.       No current facility-administered medications on file prior to visit.    No Known  Allergies  Review of Systems  Review of Systems  Constitutional: Positive for  malaise/fatigue. Negative for fever.  HENT: Negative for congestion.   Eyes: Negative for discharge.  Respiratory: Negative for shortness of breath.   Cardiovascular: Negative for chest pain, palpitations and leg swelling.  Gastrointestinal: Positive for melena. Negative for heartburn, nausea, abdominal pain, diarrhea, constipation and blood in stool.  Genitourinary: Negative for dysuria.  Musculoskeletal: Negative for falls.  Skin: Negative for rash.  Neurological: Negative for loss of consciousness and headaches.  Endo/Heme/Allergies: Negative for polydipsia.  Psychiatric/Behavioral: Positive for depression and memory loss. Negative for suicidal ideas. The patient is not nervous/anxious and does not have insomnia.     Objective  BP 103/66  Pulse 50  Temp(Src) 97.8 F (36.6 C) (Oral)  Ht 5\' 6"  (1.676 m)  Wt 151 lb 12.8 oz (68.856 kg)  BMI 24.51 kg/m2  SpO2 100%  Physical Exam  Physical Exam  Constitutional: She is oriented to person, place, and time and well-developed, well-nourished, and in no distress. No distress.  HENT:  Head: Normocephalic and atraumatic.  Eyes: Conjunctivae are normal.  Neck: Neck supple. No thyromegaly present.  Cardiovascular: Normal rate, regular rhythm and normal heart sounds.   No murmur heard. Pulmonary/Chest: Effort normal and breath sounds normal. She has no wheezes.  Abdominal: She exhibits no distension and no mass.  Rectal exam, dark stool heme positive, external hemorrhoids not inflamed  Musculoskeletal: She exhibits no edema.  Lymphadenopathy:    She has no cervical adenopathy.  Neurological: She is alert and oriented to person, place, and time.  Skin: Skin is warm and dry. No rash noted. She is not diaphoretic.  Psychiatric: Memory, affect and judgment normal.    Lab Results  Component Value Date   TSH 1.51 12/22/2013   Lab Results  Component Value  Date   WBC 6.9 01/05/2014   HGB 9.8* 01/05/2014   HCT 28.9* 01/05/2014   MCV 90.6 01/05/2014   PLT 204.0 01/05/2014   Lab Results  Component Value Date   CREATININE 2.2* 01/05/2014   BUN 38* 01/05/2014   NA 139 01/05/2014   K 3.8 01/05/2014   CL 111 01/05/2014   CO2 19 01/05/2014   Lab Results  Component Value Date   ALT 11 12/28/2013   AST 13 12/28/2013   ALKPHOS 52 12/28/2013   BILITOT 0.3 12/28/2013   Lab Results  Component Value Date   CHOL 176 12/22/2013   Lab Results  Component Value Date   HDL 40.60 12/22/2013   Lab Results  Component Value Date   LDLCALC 73 09/13/2013   Lab Results  Component Value Date   TRIG 292.0* 12/22/2013   Lab Results  Component Value Date   CHOLHDL 4 12/22/2013     Assessment & Plan  Anemia Stable since hospitalization but is describing dark, sticky stool again the past few days. Heme positive in office. Stopped Plavix, Citalopram for now. Referred for outpatient consultation with gastroenterology due to recurrence and duodenal ulcer. Continue iron supplements and seek care if symptoms worsen. No stools so far today, 2 yesterday  GIB (gastrointestinal bleeding) Recurrent, heme positive in office. Referred back to gastroenterology, seek care if worsens  CKD (chronic kidney disease) stage 4, GFR 15-29 ml/min Creatinine is up since hospitalization again, stopped Quinapril and Maxzide for now and encouraged increased hydration. Repeat renal panel this week.  Depression Son is with her today and reports her mood has improved since we started Citalopram but due to recent GI bleed will need to hold for now. Once she stabilizes we may  need to reconsider restarting meds.  CAD (coronary artery disease) of artery bypass graft Denies CP, SOB or concerning symptoms despite recent GI bleed

## 2014-01-07 NOTE — Assessment & Plan Note (Signed)
Stable since hospitalization but is describing dark, sticky stool again the past few days. Heme positive in office. Stopped Plavix, Citalopram for now. Referred for outpatient consultation with gastroenterology due to recurrence and duodenal ulcer. Continue iron supplements and seek care if symptoms worsen. No stools so far today, 2 yesterday

## 2014-01-11 ENCOUNTER — Other Ambulatory Visit: Payer: Self-pay

## 2014-01-11 DIAGNOSIS — N3001 Acute cystitis with hematuria: Secondary | ICD-10-CM

## 2014-01-11 MED ORDER — AMLODIPINE BESYLATE 10 MG PO TABS: 10.0000 mg | ORAL_TABLET | Freq: Every day | ORAL | Status: DC

## 2014-01-11 MED ORDER — QUINAPRIL HCL 10 MG PO TABS: 10.0000 mg | ORAL_TABLET | Freq: Every day | ORAL | Status: DC

## 2014-01-11 MED ORDER — FUROSEMIDE 20 MG PO TABS: 20.0000 mg | ORAL_TABLET | Freq: Every day | ORAL | Status: DC

## 2014-01-11 MED ORDER — DONEPEZIL HCL 10 MG PO TABS: 10.0000 mg | ORAL_TABLET | Freq: Every day | ORAL | Status: DC

## 2014-01-11 NOTE — Telephone Encounter (Signed)
Pharmacy is requesting Triamterene-HCTZ 37.25-25 1 tab daily? Our records say .5 tab daily  Also pantoprazole 40 mg 1 tab daily? Our records show 1 bid?   Please advise both rx's and send 90 day supply to [facility] [address]

## 2014-01-12 MED ORDER — PANTOPRAZOLE SODIUM 40 MG PO TBEC: 40.0000 mg | DELAYED_RELEASE_TABLET | Freq: Two times a day (BID) | ORAL | Status: DC

## 2014-01-12 MED ORDER — TRIAMTERENE-HCTZ 37.5-25 MG PO TABS: 0.5000 | ORAL_TABLET | Freq: Every day | ORAL | Status: DC

## 2014-01-12 NOTE — Telephone Encounter (Signed)
These have changed very recently what they have requested refills on is old. So Pantoprazole should be bid and Maxzide is at 1/2 tab

## 2014-01-13 ENCOUNTER — Other Ambulatory Visit: Payer: Self-pay | Admitting: Family Medicine

## 2014-01-20 ENCOUNTER — Other Ambulatory Visit: Payer: Self-pay | Admitting: Family Medicine

## 2014-01-20 NOTE — Progress Notes (Signed)
Chart Review after insurance denial for CT scan reveals  Unintended weight loss, will resubmit for approval for CT of abdomen

## 2014-01-29 ENCOUNTER — Encounter: Payer: Self-pay | Admitting: Family Medicine

## 2014-01-29 DIAGNOSIS — R634 Abnormal weight loss: Secondary | ICD-10-CM

## 2014-01-29 HISTORY — DX: Abnormal weight loss: R63.4

## 2014-01-29 NOTE — Assessment & Plan Note (Signed)
Review of chart after visit reveals persistent weight loss of nearly 20 pounds in less than a year. She is in need of a CT abd/pelvis for numerous reaasons but this is a major contributing factor.

## 2014-01-29 NOTE — Progress Notes (Signed)
Insurance declined to pay for her Ct abd/pelvis as requested. Review of chart revealed the following diagnosis to add to the request  Unintentional Weight Loss: review of chart reveals weight loss of nearly 20 pounds in less than one year.

## 2014-01-30 ENCOUNTER — Other Ambulatory Visit: Payer: Self-pay | Admitting: Family Medicine

## 2014-02-14 ENCOUNTER — Telehealth: Payer: Self-pay | Admitting: Family Medicine

## 2014-02-14 NOTE — Telephone Encounter (Signed)
Caller name: Geeting,Greg Relation to pt: self Call back number: [phone] [facility] [phone]    Reason for call:  Requesting  test strips for [facility] brand advanced glucose meter (Aga matrix)

## 2014-02-15 MED ORDER — GLUCOSE BLOOD VI STRP: ORAL_STRIP | Status: DC

## 2014-02-17 ENCOUNTER — Other Ambulatory Visit: Payer: Self-pay | Admitting: Family Medicine

## 2014-02-23 ENCOUNTER — Telehealth: Payer: Self-pay | Admitting: Family Medicine

## 2014-02-23 NOTE — Telephone Encounter (Signed)
Left a message for call back.  

## 2014-02-23 NOTE — Telephone Encounter (Signed)
OK to stop the Accupril and then come in and have her bp checked in next 2-4 weeks. Please let patient know

## 2014-02-23 NOTE — Telephone Encounter (Signed)
Dr Vanetta Mulders with [facility] recommends that the patient stops taking accupril. Please advise

## 2014-02-26 NOTE — Telephone Encounter (Signed)
Son made aware.  He stated understanding.  Appointment scheduled for 04/03/14 @ 3:15 pm.

## 2014-02-26 NOTE — Telephone Encounter (Signed)
Pt returning your call. Please call Khala, Arave (son) [phone]

## 2014-02-26 NOTE — Telephone Encounter (Signed)
Left a message for call back.  

## 2014-03-14 ENCOUNTER — Other Ambulatory Visit: Payer: Self-pay | Admitting: Family Medicine

## 2014-03-15 NOTE — Telephone Encounter (Signed)
Rx denied b/c the pt is not taking the prescription.//AB/CMA

## 2014-03-31 ENCOUNTER — Other Ambulatory Visit: Payer: Self-pay | Admitting: Family Medicine

## 2014-04-03 ENCOUNTER — Ambulatory Visit (INDEPENDENT_AMBULATORY_CARE_PROVIDER_SITE_OTHER): Payer: Medicare Other | Admitting: Family Medicine

## 2014-04-03 ENCOUNTER — Encounter: Payer: Self-pay | Admitting: Family Medicine

## 2014-04-03 VITALS — BP 140/46 | HR 53 | Temp 98.1°F | Ht 66.0 in | Wt 151.4 lb

## 2014-04-03 DIAGNOSIS — R197 Diarrhea, unspecified: Secondary | ICD-10-CM

## 2014-04-03 DIAGNOSIS — R634 Abnormal weight loss: Secondary | ICD-10-CM

## 2014-04-03 DIAGNOSIS — I10 Essential (primary) hypertension: Secondary | ICD-10-CM

## 2014-04-03 DIAGNOSIS — R109 Unspecified abdominal pain: Secondary | ICD-10-CM

## 2014-04-03 DIAGNOSIS — D649 Anemia, unspecified: Secondary | ICD-10-CM

## 2014-04-03 DIAGNOSIS — N390 Urinary tract infection, site not specified: Secondary | ICD-10-CM

## 2014-04-03 DIAGNOSIS — E1165 Type 2 diabetes mellitus with hyperglycemia: Secondary | ICD-10-CM

## 2014-04-03 LAB — IRON AND TIBC
%SAT: 9 % — ABNORMAL LOW (ref 20–55)
Iron: 22 ug/dL — ABNORMAL LOW (ref 42–145)
TIBC: 235 ug/dL — ABNORMAL LOW (ref 250–470)
UIBC: 213 ug/dL (ref 125–400)

## 2014-04-03 MED ORDER — CITALOPRAM HYDROBROMIDE 10 MG PO TABS: 10.0000 mg | ORAL_TABLET | Freq: Every day | ORAL | Status: DC

## 2014-04-03 MED ORDER — INSULIN LISPRO 100 UNIT/ML (KWIKPEN): 5.0000 [IU] | PEN_INJECTOR | Freq: Three times a day (TID) | SUBCUTANEOUS | Status: DC

## 2014-04-03 MED ORDER — ISOSORBIDE MONONITRATE ER 60 MG PO TB24: 60.0000 mg | ORAL_TABLET | Freq: Every day | ORAL | Status: DC

## 2014-04-03 MED ORDER — AMLODIPINE BESYLATE 10 MG PO TABS: 10.0000 mg | ORAL_TABLET | Freq: Every day | ORAL | Status: DC

## 2014-04-03 MED ORDER — FERROUS FUMARATE-FOLIC ACID 324-1 MG PO TABS: 1.0000 | ORAL_TABLET | Freq: Every day | ORAL | Status: DC

## 2014-04-03 MED ORDER — PANTOPRAZOLE SODIUM 40 MG PO TBEC: 40.0000 mg | DELAYED_RELEASE_TABLET | Freq: Two times a day (BID) | ORAL | Status: DC

## 2014-04-03 MED ORDER — RANITIDINE HCL 300 MG PO TABS: 300.0000 mg | ORAL_TABLET | Freq: Every day | ORAL | Status: DC

## 2014-04-03 MED ORDER — FUROSEMIDE 20 MG PO TABS: ORAL_TABLET | ORAL | Status: DC

## 2014-04-03 MED ORDER — INSULIN GLARGINE 100 UNIT/ML SOLOSTAR PEN: PEN_INJECTOR | SUBCUTANEOUS | Status: DC

## 2014-04-03 MED ORDER — DONEPEZIL HCL 10 MG PO TABS: 10.0000 mg | ORAL_TABLET | Freq: Every day | ORAL | Status: DC

## 2014-04-03 MED ORDER — LOVASTATIN 20 MG PO TABS: 20.0000 mg | ORAL_TABLET | Freq: Every day | ORAL | Status: DC

## 2014-04-03 NOTE — Progress Notes (Signed)
Pre visit review using our clinic review tool, if applicable. No additional management support is needed unless otherwise documented below in the visit note. 

## 2014-04-03 NOTE — Patient Instructions (Signed)

## 2014-04-04 LAB — COMPREHENSIVE METABOLIC PANEL
ALK PHOS: 74 U/L (ref 39–117)
ALT: 13 U/L (ref 0–35)
AST: 22 U/L (ref 0–37)
Albumin: 3.5 g/dL (ref 3.5–5.2)
BILIRUBIN TOTAL: 1.5 mg/dL — AB (ref 0.2–1.2)
BUN: 30 mg/dL — AB (ref 6–23)
CO2: 24 mEq/L (ref 19–32)
CREATININE: 1.68 mg/dL — AB (ref 0.40–1.20)
Calcium: 9.5 mg/dL (ref 8.4–10.5)
Chloride: 99 mEq/L (ref 96–112)
GFR: 31.08 mL/min — ABNORMAL LOW (ref >60.00)
GLUCOSE: 212 mg/dL — AB (ref 70–99)
Potassium: 4.4 mEq/L (ref 3.5–5.1)
Sodium: 135 mEq/L (ref 135–145)
Total Protein: 7.1 g/dL (ref 6.0–8.3)

## 2014-04-04 LAB — URINALYSIS, ROUTINE W REFLEX MICROSCOPIC
NITRITE: NEGATIVE
Specific Gravity, Urine: 1.025 (ref 1.000–1.030)
URINE GLUCOSE: NEGATIVE
Urobilinogen, UA: 0.2 (ref 0.0–1.0)
pH: 6 (ref 5.0–8.0)

## 2014-04-04 LAB — CBC
HEMATOCRIT: 35.5 % — AB (ref 36.0–46.0)
HEMOGLOBIN: 12.2 g/dL (ref 12.0–15.0)
MCHC: 34.3 g/dL (ref 30.0–36.0)
MCV: 84.6 fl (ref 78.0–100.0)
Platelets: 155 10*3/uL (ref 150.0–400.0)
RBC: 4.19 Mil/uL (ref 3.87–5.11)
RDW: 14.4 % (ref 11.5–15.5)
WBC: 13.1 10*3/uL — AB (ref 4.0–10.5)

## 2014-04-04 NOTE — Telephone Encounter (Signed)
Rx request for Plavix- called patient and her son stated that she was still taking this, although it is not listed on her medications here.  She had an office visit 04/04/14 for rectal bleeding.  Please advise.    eal

## 2014-04-05 ENCOUNTER — Other Ambulatory Visit: Payer: Self-pay

## 2014-04-05 DIAGNOSIS — N390 Urinary tract infection, site not specified: Secondary | ICD-10-CM

## 2014-04-05 MED ORDER — CEFDINIR 300 MG PO CAPS: 300.0000 mg | ORAL_CAPSULE | Freq: Two times a day (BID) | ORAL | Status: DC

## 2014-04-05 NOTE — Telephone Encounter (Signed)
She should not keep taking the Plavix, she has now had 2 GI bleeds. She should start Aspirin 81 mg tabs 1 once a day this week and then increase to bid next week.

## 2014-04-05 NOTE — Telephone Encounter (Signed)
Called patient and again spoke with son regarding Plavix.  Patient advised to discontinue the Plavix and start Aspirin (per MD instructions).  Patient stated "I respectfully disagree with that advice and would like to talk to the doctor about it."

## 2014-04-06 LAB — URINE CULTURE

## 2014-04-08 ENCOUNTER — Encounter: Payer: Self-pay | Admitting: Family Medicine

## 2014-04-08 DIAGNOSIS — N390 Urinary tract infection, site not specified: Secondary | ICD-10-CM | POA: Insufficient documentation

## 2014-04-08 NOTE — Assessment & Plan Note (Signed)
Until this week had been feeling much better and eating well. Will continue to monitor

## 2014-04-08 NOTE — Assessment & Plan Note (Signed)
Well controlled, no changes to meds. Encouraged heart healthy diet such as the DASH diet and exercise as tolerated.  °

## 2014-04-08 NOTE — Assessment & Plan Note (Signed)
Treated with antibiotics and encouraged probiotics and increased fluids.

## 2014-04-08 NOTE — Assessment & Plan Note (Signed)
Recurrent restart Hemocyte, check stool for blood, family is aware of what to watch for any sign of increased bleeding and they will seek immediate care. Stop Xarelto and start 81 mg aspirin

## 2014-04-08 NOTE — Progress Notes (Signed)
Patient ID: Destiny [name], female   DOB: September 08, 1931, 81 y.o.   MRN: [id number]   MONTRICE BETTINI  [id number] 04-12-1931 04/08/2014      Progress Note-Follow Up  Subjective  Chief Complaint  Chief Complaint  Patient presents with  . Follow-up  . Rectal Bleeding    HPI  Patient is a 81 y.o. female in today for routine medical care. In today with her son. Had been doing much better til this past week when she developed malaise and anorexia again. They have seen one dark stool daily for the past couple of days, they had continued her Xarelto. She is not having abdominal pain but is noting a drop in her appetite over past couple of days. Was eating well. Noting dysuria, urinary frequency, urgencry. No hematuria. Denies CP/palp/SOB/HA/congestion/fevers. Taking meds as prescribed  Past Medical History  Diagnosis Date  . PVD (peripheral vascular disease)   . CAD (coronary artery disease)     a. s/p CABG x 4 (LIMA->LAD, VG->Diag, VG->OM1->OM3);  b. 2010 Cath: 3/4 patent grafts (VG->diag occluded), 3vd, sev PVD ->Med Rx;  c. 12/2011 Lexi MV: sm area of mild mid and apical ant wall ischemia ->med rx.  . Hypertension   . Hyperlipidemia   . Duodenal ulcer 2009 and 11/2011    caused GI bleeding  . Iron deficiency anemia secondary to blood loss (chronic)   . Amaurosis fugax   . Carotid bruit   . Diabetes mellitus type II     insulin dependent.   . Dementia     pt denies  . Diabetic retinopathy   . Gallstones 2009    seen on CT scan  . Diverticulosis   . Atherosclerosis   . Chronic kidney disease (CKD) stage G4/A1, severely decreased glomerular filtration rate (GFR) between 15-29 mL/min/1.73 square meter and albuminuria creatinine ratio less than 30 mg/g     GFR 25 on 12/25/13  . CVA (cerebral infarction)   . Diabetic peripheral neuropathy   . GI bleed 04/2011, 11/2011    found non-bleeding duodenal ulcers  . Arthritis 12/06/2012  . Hypersomnolence 03/12/2013  . Acute renal insufficiency  12/24/2013  . Depression 12/24/2013  . Melena 1015/2015    PEPTIC ULCER    REQUIRING TRANSFUSION  . Weight loss, non-intentional 01/29/2014  . UTI (urinary tract infection) 04/08/2014    Past Surgical History  Procedure Laterality Date  . Coronary artery bypass graft  1999    x 4  . Abdominal hysterectomy    . Upper gastrointestinal endoscopy  02/22/2008    w/biopsy, duedenal ulcer, antrum erosions  . Esophagogastroduodenoscopy  05/16/2011    Procedure: ESOPHAGOGASTRODUODENOSCOPY (EGD);  Surgeon: Gatha Mayer, MD;  Location: [facility] ENDOSCOPY;  Service: Endoscopy;  Laterality: N/A;  . Colonoscopy  05/20/2011    Procedure: COLONOSCOPY;  Surgeon: Scarlette Shorts, MD;  Location: [place];  Service: Endoscopy;  Laterality: N/A;  . Esophagogastroduodenoscopy  12/08/2011    Procedure: ESOPHAGOGASTRODUODENOSCOPY (EGD);  Surgeon: Ladene Artist, MD,FACG;  Location: [facility] ENDOSCOPY;  Service: Endoscopy;  Laterality: N/A;  . Enteroscopy N/A 12/29/2013    Procedure: ENTEROSCOPY;  Surgeon: Inda Castle, MD;  Location: [place];  Service: Endoscopy;  Laterality: N/A;    Family History  Problem Relation Age of Onset  . Coronary artery disease Father   . Hypertension Father   . Stroke Father   . Cancer Mother     ?  . Diabetes Maternal Uncle   . Colon cancer Neg Hx  History   Social History  . Marital Status: Widowed    Spouse Name: N/A    Number of Children: 4  . Years of Education: N/A   Occupational History  . retired    Social History Main Topics  . Smoking status: Former Smoker    Types: Cigarettes    Quit date: 12/31/2000  . Smokeless tobacco: Never Used  . Alcohol Use: No  . Drug Use: No  . Sexual Activity: Not on file   Other Topics Concern  . Not on file   Social History Narrative   Widowed - husband passed away in 05/14/09   lives with daughter with epilepsy - mental retardation   Retired     Former Smoker      Alcohol use-no          Occupation: Retired        son - Sigourney Jablonowski     Current Outpatient Prescriptions on File Prior to Visit  Medication Sig Dispense Refill  . Alcohol Swabs (ALCOHOL PREP) 70 % PADS     . ASSURE COMFORT LANCETS 30G MISC     . Cholecalciferol (VITAMIN D) 1000 UNITS capsule Take 1,000 Units by mouth every evening.     . Ferrous Sulfate (IRON) 325 (65 FE) MG TABS Take 325 mg by mouth 2 (two) times daily.     Marland Kitchen glucose blood test strip Please dispense test strips for [employer] Advanced Glucose Meter  Check BS once daily prn  DX: E11.9 100 each 1  . nitroGLYCERIN (NITROSTAT) 0.4 MG SL tablet Place 0.4 mg under the tongue every 5 (five) minutes as needed for chest pain.    . Omega-3 Fatty Acids (FISH OIL) 1200 MG CAPS Take 1,200 mg by mouth daily.     . clopidogrel (PLAVIX) 75 MG tablet TAKE 1 TABLET BY MOUTH DAILY. 90 tablet 0  . colesevelam (WELCHOL) 625 MG tablet Take 625 mg by mouth 2 (two) times daily with a meal.    . Glucosamine-Chondroit-Vit C-Mn (GLUCOSAMINE CHONDR 500 COMPLEX) CAPS Take 1 capsule by mouth daily.     . quinapril (ACCUPRIL) 10 MG tablet Take 1 tablet (10 mg total) by mouth daily. (Patient not taking: Reported on 04/03/2014) 90 tablet 1  . triamterene-hydrochlorothiazide (MAXZIDE-25) 37.5-25 MG per tablet Take 0.5 tablets by mouth daily. (Patient not taking: Reported on 04/03/2014) 30 tablet 3  . vitamin B-12 (CYANOCOBALAMIN) 1000 MCG tablet Take 1,000 mcg by mouth daily.     No current facility-administered medications on file prior to visit.    No Known Allergies  Review of Systems  Review of Systems  Constitutional: Positive for malaise/fatigue. Negative for fever.  HENT: Negative for congestion.   Eyes: Negative for discharge.  Respiratory: Negative for shortness of breath.   Cardiovascular: Negative for chest pain, palpitations and leg swelling.  Gastrointestinal: Positive for blood in stool and melena. Negative for heartburn, nausea, vomiting, abdominal pain, diarrhea and constipation.    Genitourinary: Positive for dysuria and frequency. Negative for hematuria.  Musculoskeletal: Negative for falls.  Skin: Negative for rash.  Neurological: Negative for loss of consciousness and headaches.  Endo/Heme/Allergies: Negative for polydipsia.  Psychiatric/Behavioral: Negative for depression and suicidal ideas. The patient is not nervous/anxious and does not have insomnia.     Objective  BP 140/46 mmHg  Pulse 53  Temp(Src) 98.1 F (36.7 C) (Tympanic)  Ht 5\' 6"  (1.676 m)  Wt 151 lb 6.4 oz (68.675 kg)  BMI 24.45 kg/m2  SpO2  99%  Physical Exam  Physical Exam  Lab Results  Component Value Date   TSH 1.51 12/22/2013   Lab Results  Component Value Date   WBC 13.1* 04/03/2014   HGB 12.2 04/03/2014   HCT 35.5* 04/03/2014   MCV 84.6 04/03/2014   PLT 155.0 04/03/2014   Lab Results  Component Value Date   CREATININE 1.68* 04/03/2014   BUN 30* 04/03/2014   NA 135 04/03/2014   K 4.4 04/03/2014   CL 99 04/03/2014   CO2 24 04/03/2014   Lab Results  Component Value Date   ALT 13 04/03/2014   AST 22 04/03/2014   ALKPHOS 74 04/03/2014   BILITOT 1.5* 04/03/2014   Lab Results  Component Value Date   CHOL 176 12/22/2013   Lab Results  Component Value Date   HDL 40.60 12/22/2013   Lab Results  Component Value Date   LDLCALC 73 09/13/2013   Lab Results  Component Value Date   TRIG 292.0* 12/22/2013   Lab Results  Component Value Date   CHOLHDL 4 12/22/2013     Assessment & Plan  Anemia Recurrent restart Hemocyte, check stool for blood, family is aware of what to watch for any sign of increased bleeding and they will seek immediate care. Stop Xarelto and start 81 mg aspirin   Weight loss, non-intentional Until this week had been feeling much better and eating well. Will continue to monitor   Essential hypertension Well controlled, no changes to meds. Encouraged heart healthy diet such as the DASH diet and exercise as tolerated.    UTI (urinary  tract infection) Treated with antibiotics and encouraged probiotics and increased fluids.

## 2014-04-12 ENCOUNTER — Telehealth: Payer: Self-pay | Admitting: Family Medicine

## 2014-04-12 NOTE — Telephone Encounter (Signed)
Spoke with son, [name], who stated that he was not taking patient to the ER at this time.  He stated that she is passing a significant amount of bright red blood in stool, but "she is not displaying symptoms of anemia."  When son was asked to asked mother about the symptoms she was experiencing, he stated he will, but that he's not near her right now.  States patient does not have a history of hemorrhoids.  She is no longer taking Xarelto or ASA 81 mg.  [name] was encouraged to have patient seen today.  He asked for an appointment tomorrow instead.  An acute appointment was scheduled with Mackie Pai, PA-C for tomorrow (04/13/14) at 8:00 am.  He was strongly encouraged that if patient complains of feeling excessively weak, lightheaded, dizzy, or feeling faint to immediately take patient to the ER.  Pt stated understanding and agreed to comply.

## 2014-04-12 NOTE — Telephone Encounter (Signed)
Patient Name: Destiny [name] DOB: Jul 19, 1931 Initial Comment Caller states his mother is having black tarry stools and she did see the doctor a few days ago. Now, it is bright red blood diarrhea stools. Nurse Assessment Nurse: Marcelline Deist, RN, Lynda Date/Time (Eastern Time): 04/12/2014 9:39:37 AM Confirm and document reason for call. If symptomatic, describe symptoms. ---Caller states his mother is having black tarry stools and she did see the doctor a few days ago. Now, it is bright red blood diarrhea stools. Has a GI bleed, been hospitalized a few times within 18 months. Hgb was good. Has low energy which is her baseline. Her Dr. was going to follow-up with blood work. Has the patient traveled out of the country within the last 30 days? ---Not Applicable Does the patient require triage? ---Yes Related visit to physician within the last 2 weeks? ---Yes Does the PT have any chronic conditions? (i.e. diabetes, asthma, etc.) ---Yes List chronic conditions. ---GI bleed, diabetic, renal failure - stage 3 or 4, bypass hx. Guidelines Guideline Title Affirmed Question Affirmed Notes Diarrhea [1] Blood in the stool AND [2] moderate or large amount of blood Final Disposition User Go to ED Now Marcelline Deist, RN, Kermit Balo Comments Caller would like to know when he should bring his mother in for more blood work, or if there should be any changes to her medications. If it possible to give him a call at the primary #, that would be appreciated.

## 2014-04-13 ENCOUNTER — Telehealth: Payer: Self-pay | Admitting: *Deleted

## 2014-04-13 ENCOUNTER — Telehealth: Payer: Self-pay | Admitting: Family Medicine

## 2014-04-13 ENCOUNTER — Ambulatory Visit: Payer: Medicare Other | Admitting: Medical

## 2014-04-13 DIAGNOSIS — D6489 Other specified anemias: Secondary | ICD-10-CM

## 2014-04-13 DIAGNOSIS — K2961 Other gastritis with bleeding: Secondary | ICD-10-CM

## 2014-04-13 NOTE — Telephone Encounter (Signed)
Caller name: Rosalita Anderson Relation to pt: Son Call back number: [phone] Pharmacy:  Reason for call: Son came in office and states would like to speak with Dr. [name] to clarify for lab checks. Since Lab are not in record wants to know when can get any labs done. Please advise.

## 2014-04-13 NOTE — Telephone Encounter (Signed)
No charge pt.

## 2014-04-13 NOTE — Telephone Encounter (Signed)
Pt arrived for appointment 04/13/14 (23) minutes late and Percell Miller could not see the patient due to being late. Percell Miller said he could see the pt in the afternoon but pt stated they could not come this afternoon.  Advised pt to go downstairs to the ER.   Charge no show fee?

## 2014-04-13 NOTE — Telephone Encounter (Signed)
See note below

## 2014-04-16 NOTE — Telephone Encounter (Addendum)
Called and spoke with the pt's son Destiny [name]) and he stated that at the pt's last appt with Dr. [name] she wanted to track the pt's hgb,but when they came in and asked to have it checked he was told that there were not orders.   The son wants to know if the pt is to have her hgb tracked,and if she is when.  Please advise.//AB/CMA

## 2014-04-16 NOTE — Telephone Encounter (Signed)
I agree we need to track hgb due to recent bleed. Please order a CBC for 2/2 and another one for 2/9 for anemia and GI bleed

## 2014-04-17 NOTE — Telephone Encounter (Signed)
Spoke with son and apologized for the confusion. Scheduled his mom for 2 visits and entered orders for future cbc

## 2014-04-18 ENCOUNTER — Other Ambulatory Visit: Payer: Medicare Other

## 2014-04-19 ENCOUNTER — Other Ambulatory Visit: Payer: Medicare Other

## 2014-04-19 ENCOUNTER — Other Ambulatory Visit (INDEPENDENT_AMBULATORY_CARE_PROVIDER_SITE_OTHER): Payer: Medicare Other

## 2014-04-19 DIAGNOSIS — D6489 Other specified anemias: Secondary | ICD-10-CM

## 2014-04-19 DIAGNOSIS — K2961 Other gastritis with bleeding: Secondary | ICD-10-CM

## 2014-04-20 LAB — CBC WITH DIFFERENTIAL/PLATELET
BASOS ABS: 0 10*3/uL (ref 0.0–0.1)
BASOS PCT: 0.5 % (ref 0.0–3.0)
EOS ABS: 0.2 10*3/uL (ref 0.0–0.7)
EOS PCT: 2.5 % (ref 0.0–5.0)
HCT: 33.5 % — ABNORMAL LOW (ref 36.0–46.0)
HEMOGLOBIN: 11.3 g/dL — AB (ref 12.0–15.0)
Lymphocytes Relative: 27.7 % (ref 12.0–46.0)
Lymphs Abs: 2.3 10*3/uL (ref 0.7–4.0)
MCHC: 33.6 g/dL (ref 30.0–36.0)
MCV: 83.7 fl (ref 78.0–100.0)
Monocytes Absolute: 0.5 10*3/uL (ref 0.1–1.0)
Monocytes Relative: 6.6 % (ref 3.0–12.0)
NEUTROS PCT: 62.7 % (ref 43.0–77.0)
Neutro Abs: 5.1 10*3/uL (ref 1.4–7.7)
Platelets: 249 10*3/uL (ref 150.0–400.0)
RBC: 4 Mil/uL (ref 3.87–5.11)
RDW: 15.2 % (ref 11.5–15.5)
WBC: 8.2 10*3/uL (ref 4.0–10.5)

## 2014-04-25 ENCOUNTER — Other Ambulatory Visit (INDEPENDENT_AMBULATORY_CARE_PROVIDER_SITE_OTHER): Payer: Medicare Other

## 2014-04-25 DIAGNOSIS — K2961 Other gastritis with bleeding: Secondary | ICD-10-CM

## 2014-04-25 DIAGNOSIS — D6489 Other specified anemias: Secondary | ICD-10-CM

## 2014-04-25 LAB — CBC WITH DIFFERENTIAL/PLATELET
Basophils Absolute: 0.1 10*3/uL (ref 0.0–0.1)
Basophils Relative: 0.6 % (ref 0.0–3.0)
Eosinophils Absolute: 0.2 10*3/uL (ref 0.0–0.7)
Eosinophils Relative: 2 % (ref 0.0–5.0)
HCT: 36.4 % (ref 36.0–46.0)
HEMOGLOBIN: 12.1 g/dL (ref 12.0–15.0)
LYMPHS ABS: 1.7 10*3/uL (ref 0.7–4.0)
Lymphocytes Relative: 19.1 % (ref 12.0–46.0)
MCHC: 33.2 g/dL (ref 30.0–36.0)
MCV: 84.2 fl (ref 78.0–100.0)
MONO ABS: 0.8 10*3/uL (ref 0.1–1.0)
MONOS PCT: 8.7 % (ref 3.0–12.0)
NEUTROS PCT: 69.6 % (ref 43.0–77.0)
Neutro Abs: 6.3 10*3/uL (ref 1.4–7.7)
PLATELETS: 202 10*3/uL (ref 150.0–400.0)
RBC: 4.32 Mil/uL (ref 3.87–5.11)
RDW: 15.6 % — ABNORMAL HIGH (ref 11.5–15.5)
WBC: 9 10*3/uL (ref 4.0–10.5)

## 2014-05-17 ENCOUNTER — Encounter: Payer: Self-pay | Admitting: Family Medicine

## 2014-05-17 ENCOUNTER — Ambulatory Visit (INDEPENDENT_AMBULATORY_CARE_PROVIDER_SITE_OTHER): Payer: Medicare Other | Admitting: Family Medicine

## 2014-05-17 VITALS — BP 140/72 | HR 52 | Temp 97.9°F | Ht 66.0 in | Wt 154.5 lb

## 2014-05-17 DIAGNOSIS — E7800 Pure hypercholesterolemia, unspecified: Secondary | ICD-10-CM

## 2014-05-17 DIAGNOSIS — N390 Urinary tract infection, site not specified: Secondary | ICD-10-CM

## 2014-05-17 DIAGNOSIS — E119 Type 2 diabetes mellitus without complications: Secondary | ICD-10-CM

## 2014-05-17 DIAGNOSIS — D500 Iron deficiency anemia secondary to blood loss (chronic): Secondary | ICD-10-CM

## 2014-05-17 DIAGNOSIS — K922 Gastrointestinal hemorrhage, unspecified: Secondary | ICD-10-CM

## 2014-05-17 DIAGNOSIS — E1129 Type 2 diabetes mellitus with other diabetic kidney complication: Secondary | ICD-10-CM

## 2014-05-17 DIAGNOSIS — I10 Essential (primary) hypertension: Secondary | ICD-10-CM

## 2014-05-17 DIAGNOSIS — IMO0002 Reserved for concepts with insufficient information to code with codable children: Secondary | ICD-10-CM

## 2014-05-17 DIAGNOSIS — E669 Obesity, unspecified: Secondary | ICD-10-CM

## 2014-05-17 DIAGNOSIS — E1169 Type 2 diabetes mellitus with other specified complication: Secondary | ICD-10-CM

## 2014-05-17 DIAGNOSIS — E1165 Type 2 diabetes mellitus with hyperglycemia: Secondary | ICD-10-CM

## 2014-05-17 DIAGNOSIS — R001 Bradycardia, unspecified: Secondary | ICD-10-CM

## 2014-05-17 MED ORDER — DONEPEZIL HCL 10 MG PO TABS: 10.0000 mg | ORAL_TABLET | Freq: Every day | ORAL | Status: DC

## 2014-05-17 NOTE — Progress Notes (Signed)
Pre visit review using our clinic review tool, if applicable. No additional management support is needed unless otherwise documented below in the visit note. 

## 2014-05-17 NOTE — Patient Instructions (Signed)
Anemia, Nonspecific Anemia is a condition in which the concentration of red blood cells or hemoglobin in the blood is below normal. Hemoglobin is a substance in red blood cells that carries oxygen to the tissues of the body. Anemia results in not enough oxygen reaching these tissues.  CAUSES  Common causes of anemia include:   Excessive bleeding. Bleeding may be internal or external. This includes excessive bleeding from periods (in women) or from the intestine.   Poor nutrition.   Chronic kidney, thyroid, and liver disease.  Bone marrow disorders that decrease red blood cell production.  Cancer and treatments for cancer.  HIV, AIDS, and their treatments.  Spleen problems that increase red blood cell destruction.  Blood disorders.  Excess destruction of red blood cells due to infection, medicines, and autoimmune disorders. SIGNS AND SYMPTOMS   Minor weakness.   Dizziness.   Headache.  Palpitations.   Shortness of breath, especially with exercise.   Paleness.  Cold sensitivity.  Indigestion.  Nausea.  Difficulty sleeping.  Difficulty concentrating. Symptoms may occur suddenly or they may develop slowly.  DIAGNOSIS  Additional blood tests are often needed. These help your health care provider determine the best treatment. Your health care provider will check your stool for blood and look for other causes of blood loss.  TREATMENT  Treatment varies depending on the cause of the anemia. Treatment can include:   Supplements of iron, vitamin B12, or folic acid.   Hormone medicines.   A blood transfusion. This may be needed if blood loss is severe.   Hospitalization. This may be needed if there is significant continual blood loss.   Dietary changes.  Spleen removal. HOME CARE INSTRUCTIONS Keep all follow-up appointments. It often takes many weeks to correct anemia, and having your health care provider check on your condition and your response to  treatment is very important. SEEK IMMEDIATE MEDICAL CARE IF:   You develop extreme weakness, shortness of breath, or chest pain.   You become dizzy or have trouble concentrating.  You develop heavy vaginal bleeding.   You develop a rash.   You have bloody or black, tarry stools.   You faint.   You vomit up blood.   You vomit repeatedly.   You have abdominal pain.  You have a fever or persistent symptoms for more than 2-3 days.   You have a fever and your symptoms suddenly get worse.   You are dehydrated.  MAKE SURE YOU:  Understand these instructions.  Will watch your condition.  Will get help right away if you are not doing well or get worse. Document Released: 04/09/2004 Document Revised: 11/02/2012 Document Reviewed: 08/26/2012 Exit[employer] Patient Information 2015 [employer]. This information is not intended to replace advice given to you by your health care provider. Make sure you discuss any questions you have with your health care provider.  

## 2014-05-27 ENCOUNTER — Encounter: Payer: Self-pay | Admitting: Family Medicine

## 2014-05-27 NOTE — Assessment & Plan Note (Signed)
Resolved with resolution of bleeding. Did not tolerate anticoagulation

## 2014-05-27 NOTE — Progress Notes (Signed)
Destiny [name]  [id number] 03/15/1932 05/27/2014      Progress Note-Follow Up  Subjective  Chief Complaint  Chief Complaint  Patient presents with  . Follow-up    6 week    HPI  Patient is a 81 y.o. female in today for routine medical care. Patient is in today with her son for reevaluation and doing much better. They report her strength is up and her mood is improved. They report she's had no further GI bleeds. Her bowels are moving fairly comfortably. No abdominal pain, anorexia or acute concerns. Denies CP/palp/SOB/HA/congestion/fevers/GI or GU c/o. Taking meds as prescribed  Past Medical History  Diagnosis Date  . PVD (peripheral vascular disease)   . CAD (coronary artery disease)     a. s/p CABG x 4 (LIMA->LAD, VG->Diag, VG->OM1->OM3);  b. 2010 Cath: 3/4 patent grafts (VG->diag occluded), 3vd, sev PVD ->Med Rx;  c. 12/2011 Lexi MV: sm area of mild mid and apical ant wall ischemia ->med rx.  . Hypertension   . Hyperlipidemia   . Duodenal ulcer 2009 and 11/2011    caused GI bleeding  . Iron deficiency anemia secondary to blood loss (chronic)   . Amaurosis fugax   . Carotid bruit   . Diabetes mellitus type II     insulin dependent.   . Dementia     pt denies  . Diabetic retinopathy   . Gallstones 2009    seen on CT scan  . Diverticulosis   . Atherosclerosis   . Chronic kidney disease (CKD) stage G4/A1, severely decreased glomerular filtration rate (GFR) between 15-29 mL/min/1.73 square meter and albuminuria creatinine ratio less than 30 mg/g     GFR 25 on 12/25/13  . CVA (cerebral infarction)   . Diabetic peripheral neuropathy   . GI bleed 04/2011, 11/2011    found non-bleeding duodenal ulcers  . Arthritis 12/06/2012  . Hypersomnolence 03/12/2013  . Acute renal insufficiency 12/24/2013  . Depression 12/24/2013  . Melena 1015/2015    PEPTIC ULCER    REQUIRING TRANSFUSION  . Weight loss, non-intentional 01/29/2014  . UTI (urinary tract infection) 04/08/2014     Past Surgical History  Procedure Laterality Date  . Coronary artery bypass graft  1999    x 4  . Abdominal hysterectomy    . Upper gastrointestinal endoscopy  02/22/2008    w/biopsy, duedenal ulcer, antrum erosions  . Esophagogastroduodenoscopy  05/16/2011    Procedure: ESOPHAGOGASTRODUODENOSCOPY (EGD);  Surgeon: Gatha Mayer, MD;  Location: [facility] ENDOSCOPY;  Service: Endoscopy;  Laterality: N/A;  . Colonoscopy  05/20/2011    Procedure: COLONOSCOPY;  Surgeon: Scarlette Shorts, MD;  Location: [place];  Service: Endoscopy;  Laterality: N/A;  . Esophagogastroduodenoscopy  12/08/2011    Procedure: ESOPHAGOGASTRODUODENOSCOPY (EGD);  Surgeon: Ladene Artist, MD,FACG;  Location: [facility] ENDOSCOPY;  Service: Endoscopy;  Laterality: N/A;  . Enteroscopy N/A 12/29/2013    Procedure: ENTEROSCOPY;  Surgeon: Inda Castle, MD;  Location: [place];  Service: Endoscopy;  Laterality: N/A;    Family History  Problem Relation Age of Onset  . Coronary artery disease Father   . Hypertension Father   . Stroke Father   . Cancer Mother     ?  . Diabetes Maternal Uncle   . Colon cancer Neg Hx     History   Social History  . Marital Status: Widowed    Spouse Name: N/A  . Number of Children: 4  . Years of Education: N/A   Occupational History  .  retired    Social History Main Topics  . Smoking status: Former Smoker    Types: Cigarettes    Quit date: 12/31/2000  . Smokeless tobacco: Never Used  . Alcohol Use: No  . Drug Use: No  . Sexual Activity: Not on file   Other Topics Concern  . Not on file   Social History Narrative   Widowed - husband passed away in 2009-05-24   lives with daughter with epilepsy - mental retardation   Retired     Former Smoker      Alcohol use-no          Occupation: Retired       son - Marnetta Monsma     Current Outpatient Prescriptions on File Prior to Visit  Medication Sig Dispense Refill  . Alcohol Swabs (ALCOHOL PREP) 70 % PADS     . amLODipine (NORVASC)  10 MG tablet Take 1 tablet (10 mg total) by mouth daily. 90 tablet 1  . ASSURE COMFORT LANCETS 30G MISC     . Cholecalciferol (VITAMIN D) 1000 UNITS capsule Take 1,000 Units by mouth every evening.     . citalopram (CELEXA) 10 MG tablet Take 1 tablet (10 mg total) by mouth daily. 30 tablet 3  . clopidogrel (PLAVIX) 75 MG tablet TAKE 1 TABLET BY MOUTH DAILY. 90 tablet 0  . colesevelam (WELCHOL) 625 MG tablet Take 625 mg by mouth 2 (two) times daily with a meal.    . Ferrous Fumarate-Folic Acid (HEMOCYTE-F) 324-1 MG TABS Take 1 tablet by mouth daily. 30 each 3  . Ferrous Sulfate (IRON) 325 (65 FE) MG TABS Take 325 mg by mouth 2 (two) times daily.     . furosemide (LASIX) 20 MG tablet TAKE 1 TABLET (20 MG TOTAL) BY MOUTH DAILY. 90 tablet 1  . Glucosamine-Chondroit-Vit C-Mn (GLUCOSAMINE CHONDR 500 COMPLEX) CAPS Take 1 capsule by mouth daily.     Marland Kitchen glucose blood test strip Please dispense test strips for [employer] Advanced Glucose Meter  Check BS once daily prn  DX: E11.9 100 each 1  . Insulin Glargine (LANTUS SOLOSTAR) 100 UNIT/ML Solostar Pen INJECT 20 UNITS INTO THE SKIN AT BEDTIME. 15 pen 3  . insulin lispro (HUMALOG KWIKPEN) 100 UNIT/ML KiwkPen Inject 0.05-0.1 mLs (5-10 Units total) into the skin 3 (three) times daily with meals. 15 mL 4  . isosorbide mononitrate (IMDUR) 60 MG 24 hr tablet Take 1 tablet (60 mg total) by mouth daily. 90 tablet 1  . lovastatin (MEVACOR) 20 MG tablet Take 1 tablet (20 mg total) by mouth at bedtime. 90 tablet 3  . nitroGLYCERIN (NITROSTAT) 0.4 MG SL tablet Place 0.4 mg under the tongue every 5 (five) minutes as needed for chest pain.    . Omega-3 Fatty Acids (FISH OIL) 1200 MG CAPS Take 1,200 mg by mouth daily.     . pantoprazole (PROTONIX) 40 MG tablet Take 1 tablet (40 mg total) by mouth 2 (two) times daily before a meal. 180 tablet 1  . quinapril (ACCUPRIL) 10 MG tablet Take 1 tablet (10 mg total) by mouth daily. 90 tablet 1  . ranitidine (ZANTAC) 300 MG tablet  Take 1 tablet (300 mg total) by mouth at bedtime. 30 tablet 3  . triamterene-hydrochlorothiazide (MAXZIDE-25) 37.5-25 MG per tablet Take 0.5 tablets by mouth daily. 30 tablet 3  . vitamin B-12 (CYANOCOBALAMIN) 1000 MCG tablet Take 1,000 mcg by mouth daily.     No current facility-administered medications on file prior to  visit.    No Known Allergies  Review of Systems  Review of Systems  Constitutional: Negative for fever and malaise/fatigue.  HENT: Negative for congestion.   Eyes: Negative for discharge.  Respiratory: Negative for shortness of breath.   Cardiovascular: Negative for chest pain, palpitations and leg swelling.  Gastrointestinal: Negative for nausea, abdominal pain and diarrhea.  Genitourinary: Negative for dysuria.  Musculoskeletal: Negative for falls.  Skin: Negative for rash.  Neurological: Negative for loss of consciousness and headaches.  Endo/Heme/Allergies: Negative for polydipsia.  Psychiatric/Behavioral: Negative for depression and suicidal ideas. The patient is not nervous/anxious and does not have insomnia.     Objective  BP 140/72 mmHg  Pulse 52  Temp(Src) 97.9 F (36.6 C) (Oral)  Ht 5\' 6"  (1.676 m)  Wt 154 lb 8 oz (70.081 kg)  BMI 24.95 kg/m2  SpO2 97%  Physical Exam  Physical Exam  Lab Results  Component Value Date   TSH 1.51 12/22/2013   Lab Results  Component Value Date   WBC 9.0 04/25/2014   HGB 12.1 04/25/2014   HCT 36.4 04/25/2014   MCV 84.2 04/25/2014   PLT 202.0 04/25/2014   Lab Results  Component Value Date   CREATININE 1.68* 04/03/2014   BUN 30* 04/03/2014   NA 135 04/03/2014   K 4.4 04/03/2014   CL 99 04/03/2014   CO2 24 04/03/2014   Lab Results  Component Value Date   ALT 13 04/03/2014   AST 22 04/03/2014   ALKPHOS 74 04/03/2014   BILITOT 1.5* 04/03/2014   Lab Results  Component Value Date   CHOL 176 12/22/2013   Lab Results  Component Value Date   HDL 40.60 12/22/2013   Lab Results  Component Value  Date   LDLCALC 73 09/13/2013   Lab Results  Component Value Date   TRIG 292.0* 12/22/2013   Lab Results  Component Value Date   CHOLHDL 4 12/22/2013     Assessment & Plan  Essential hypertension Well controlled, no changes to meds. Encouraged heart healthy diet such as the DASH diet and exercise as tolerated.    GIB (gastrointestinal bleeding) No further bleeding episodes, feeling well   DM type 2, uncontrolled, with renal complications Repeat 0000000 with next visit. Minimize simple carbs and continue current meds for now   Anemia Resolved with resolution of bleeding. Did not tolerate anticoagulation   Bradycardia Stable, asymptomatic

## 2014-05-27 NOTE — Assessment & Plan Note (Signed)
Repeat HGBA1C with next visit. Minimize simple carbs and continue current meds for now

## 2014-05-27 NOTE — Assessment & Plan Note (Signed)
No further bleeding episodes, feeling well

## 2014-05-27 NOTE — Assessment & Plan Note (Signed)
Stable, asymptomatic. 

## 2014-05-27 NOTE — Assessment & Plan Note (Signed)
Well controlled, no changes to meds. Encouraged heart healthy diet such as the DASH diet and exercise as tolerated.  °

## 2014-06-01 ENCOUNTER — Other Ambulatory Visit: Payer: Self-pay | Admitting: Family Medicine

## 2014-06-04 NOTE — Telephone Encounter (Signed)
Med filled.  

## 2014-06-07 ENCOUNTER — Other Ambulatory Visit: Payer: Medicare Other

## 2014-06-14 ENCOUNTER — Other Ambulatory Visit: Payer: Medicare Other

## 2014-06-21 ENCOUNTER — Other Ambulatory Visit (INDEPENDENT_AMBULATORY_CARE_PROVIDER_SITE_OTHER): Payer: Medicare Other

## 2014-06-21 DIAGNOSIS — N390 Urinary tract infection, site not specified: Secondary | ICD-10-CM | POA: Diagnosis not present

## 2014-06-21 DIAGNOSIS — E1169 Type 2 diabetes mellitus with other specified complication: Secondary | ICD-10-CM

## 2014-06-21 DIAGNOSIS — E7800 Pure hypercholesterolemia, unspecified: Secondary | ICD-10-CM

## 2014-06-21 DIAGNOSIS — I10 Essential (primary) hypertension: Secondary | ICD-10-CM

## 2014-06-21 DIAGNOSIS — E669 Obesity, unspecified: Secondary | ICD-10-CM

## 2014-06-21 DIAGNOSIS — E119 Type 2 diabetes mellitus without complications: Secondary | ICD-10-CM

## 2014-06-21 LAB — COMPREHENSIVE METABOLIC PANEL
ALK PHOS: 79 U/L (ref 39–117)
ALT: 8 U/L (ref 0–35)
AST: 13 U/L (ref 0–37)
Albumin: 3.4 g/dL — ABNORMAL LOW (ref 3.5–5.2)
BUN: 19 mg/dL (ref 6–23)
CALCIUM: 9.4 mg/dL (ref 8.4–10.5)
CHLORIDE: 105 meq/L (ref 96–112)
CO2: 30 mEq/L (ref 19–32)
Creatinine, Ser: 1.37 mg/dL — ABNORMAL HIGH (ref 0.40–1.20)
GFR: 39.31 mL/min — ABNORMAL LOW (ref >60.00)
Glucose, Bld: 118 mg/dL — ABNORMAL HIGH (ref 70–99)
POTASSIUM: 3.7 meq/L (ref 3.5–5.1)
Sodium: 141 mEq/L (ref 135–145)
Total Bilirubin: 0.6 mg/dL (ref 0.2–1.2)
Total Protein: 7.1 g/dL (ref 6.0–8.3)

## 2014-06-21 LAB — URINALYSIS, ROUTINE W REFLEX MICROSCOPIC
Bilirubin Urine: NEGATIVE
KETONES UR: NEGATIVE
Nitrite: NEGATIVE
Specific Gravity, Urine: 1.025 (ref 1.000–1.030)
TOTAL PROTEIN, URINE-UPE24: 100 — AB
URINE GLUCOSE: NEGATIVE
UROBILINOGEN UA: 0.2 (ref 0.0–1.0)
pH: 6 (ref 5.0–8.0)

## 2014-06-21 LAB — CBC
HEMATOCRIT: 36.8 % (ref 36.0–46.0)
Hemoglobin: 12.3 g/dL (ref 12.0–15.0)
MCHC: 33.5 g/dL (ref 30.0–36.0)
MCV: 84.5 fl (ref 78.0–100.0)
Platelets: 279 10*3/uL (ref 150.0–400.0)
RBC: 4.36 Mil/uL (ref 3.87–5.11)
RDW: 15.8 % — AB (ref 11.5–15.5)
WBC: 6.7 10*3/uL (ref 4.0–10.5)

## 2014-06-21 LAB — LIPID PANEL
CHOLESTEROL: 246 mg/dL — AB (ref 0–200)
HDL: 55.7 mg/dL (ref >39.00)
LDL Cholesterol: 155 mg/dL — ABNORMAL HIGH (ref 0–99)
NonHDL: 190.3
Total CHOL/HDL Ratio: 4
Triglycerides: 176 mg/dL — ABNORMAL HIGH (ref 0.0–149.0)
VLDL: 35.2 mg/dL (ref 0.0–40.0)

## 2014-06-21 LAB — TSH: TSH: 2.77 u[IU]/mL (ref 0.35–4.50)

## 2014-06-23 LAB — URINE CULTURE

## 2014-06-25 ENCOUNTER — Other Ambulatory Visit: Payer: Self-pay | Admitting: Family Medicine

## 2014-06-25 MED ORDER — LEVOFLOXACIN 250 MG PO TABS: 250.0000 mg | ORAL_TABLET | Freq: Every day | ORAL | Status: DC

## 2014-06-26 ENCOUNTER — Ambulatory Visit (INDEPENDENT_AMBULATORY_CARE_PROVIDER_SITE_OTHER): Payer: Medicare Other | Admitting: Family Medicine

## 2014-06-26 ENCOUNTER — Encounter: Payer: Self-pay | Admitting: Family Medicine

## 2014-06-26 VITALS — BP 140/78 | HR 56 | Temp 97.7°F | Ht 66.0 in | Wt 153.0 lb

## 2014-06-26 DIAGNOSIS — D62 Acute posthemorrhagic anemia: Secondary | ICD-10-CM

## 2014-06-26 DIAGNOSIS — F32A Depression, unspecified: Secondary | ICD-10-CM

## 2014-06-26 DIAGNOSIS — N3001 Acute cystitis with hematuria: Secondary | ICD-10-CM | POA: Diagnosis not present

## 2014-06-26 DIAGNOSIS — E119 Type 2 diabetes mellitus without complications: Secondary | ICD-10-CM | POA: Diagnosis not present

## 2014-06-26 DIAGNOSIS — Z794 Long term (current) use of insulin: Secondary | ICD-10-CM

## 2014-06-26 DIAGNOSIS — I10 Essential (primary) hypertension: Secondary | ICD-10-CM | POA: Diagnosis not present

## 2014-06-26 DIAGNOSIS — F418 Other specified anxiety disorders: Secondary | ICD-10-CM | POA: Diagnosis not present

## 2014-06-26 DIAGNOSIS — F329 Major depressive disorder, single episode, unspecified: Secondary | ICD-10-CM

## 2014-06-26 DIAGNOSIS — E785 Hyperlipidemia, unspecified: Secondary | ICD-10-CM

## 2014-06-26 DIAGNOSIS — D500 Iron deficiency anemia secondary to blood loss (chronic): Secondary | ICD-10-CM

## 2014-06-26 MED ORDER — QUINAPRIL HCL 10 MG PO TABS: 10.0000 mg | ORAL_TABLET | Freq: Every day | ORAL | Status: DC

## 2014-06-26 MED ORDER — LOVASTATIN 40 MG PO TABS: 40.0000 mg | ORAL_TABLET | Freq: Every day | ORAL | Status: DC

## 2014-06-26 MED ORDER — CITALOPRAM HYDROBROMIDE 20 MG PO TABS: 20.0000 mg | ORAL_TABLET | Freq: Every day | ORAL | Status: DC

## 2014-06-26 MED ORDER — TRIAMTERENE-HCTZ 37.5-25 MG PO TABS: 0.5000 | ORAL_TABLET | Freq: Every day | ORAL | Status: DC

## 2014-06-26 NOTE — Patient Instructions (Signed)

## 2014-06-26 NOTE — Progress Notes (Signed)
Pre visit review using our clinic review tool, if applicable. No additional management support is needed unless otherwise documented below in the visit note. 

## 2014-07-02 NOTE — Progress Notes (Signed)
Destiny [name]  [id number] 1931/12/11 07/02/2014      Progress Note-Follow Up  Subjective  Chief Complaint  Chief Complaint  Patient presents with  . Follow-up    labs    HPI  Patient is a 81 y.o. female in today for routine medical care. Patient is in today for follow-up. Feeling fairly well. No recent illness. They have not seen any bloody or tarry stool. Her bowels are moving comfortably. Her mood is improved. Denies CP/palp/SOB/HA/congestion/fevers/GI or GU c/o. Taking meds as prescribed  Past Medical History  Diagnosis Date  . PVD (peripheral vascular disease)   . CAD (coronary artery disease)     a. s/p CABG x 4 (LIMA->LAD, VG->Diag, VG->OM1->OM3);  b. 2010 Cath: 3/4 patent grafts (VG->diag occluded), 3vd, sev PVD ->Med Rx;  c. 12/2011 Lexi MV: sm area of mild mid and apical ant wall ischemia ->med rx.  . Hypertension   . Hyperlipidemia   . Duodenal ulcer 2009 and 11/2011    caused GI bleeding  . Iron deficiency anemia secondary to blood loss (chronic)   . Amaurosis fugax   . Carotid bruit   . Diabetes mellitus type II     insulin dependent.   . Dementia     pt denies  . Diabetic retinopathy   . Gallstones 2009    seen on CT scan  . Diverticulosis   . Atherosclerosis   . Chronic kidney disease (CKD) stage G4/A1, severely decreased glomerular filtration rate (GFR) between 15-29 mL/min/1.73 square meter and albuminuria creatinine ratio less than 30 mg/g     GFR 25 on 12/25/13  . CVA (cerebral infarction)   . Diabetic peripheral neuropathy   . GI bleed 04/2011, 11/2011    found non-bleeding duodenal ulcers  . Arthritis 12/06/2012  . Hypersomnolence 03/12/2013  . Acute renal insufficiency 12/24/2013  . Depression 12/24/2013  . Melena 1015/2015    PEPTIC ULCER    REQUIRING TRANSFUSION  . Weight loss, non-intentional 01/29/2014  . UTI (urinary tract infection) 04/08/2014    Past Surgical History  Procedure Laterality Date  . Coronary artery bypass graft  1999      x 4  . Abdominal hysterectomy    . Upper gastrointestinal endoscopy  02/22/2008    w/biopsy, duedenal ulcer, antrum erosions  . Esophagogastroduodenoscopy  05/16/2011    Procedure: ESOPHAGOGASTRODUODENOSCOPY (EGD);  Surgeon: Gatha Mayer, MD;  Location: [facility] ENDOSCOPY;  Service: Endoscopy;  Laterality: N/A;  . Colonoscopy  05/20/2011    Procedure: COLONOSCOPY;  Surgeon: Scarlette Shorts, MD;  Location: [place];  Service: Endoscopy;  Laterality: N/A;  . Esophagogastroduodenoscopy  12/08/2011    Procedure: ESOPHAGOGASTRODUODENOSCOPY (EGD);  Surgeon: Ladene Artist, MD,FACG;  Location: [facility] ENDOSCOPY;  Service: Endoscopy;  Laterality: N/A;  . Enteroscopy N/A 12/29/2013    Procedure: ENTEROSCOPY;  Surgeon: Inda Castle, MD;  Location: [place];  Service: Endoscopy;  Laterality: N/A;    Family History  Problem Relation Age of Onset  . Coronary artery disease Father   . Hypertension Father   . Stroke Father   . Cancer Mother     ?  . Diabetes Maternal Uncle   . Colon cancer Neg Hx     History   Social History  . Marital Status: Widowed    Spouse Name: N/A  . Number of Children: 4  . Years of Education: N/A   Occupational History  . retired    Social History Main Topics  . Smoking status: Former Smoker  Types: Cigarettes    Quit date: 12/31/2000  . Smokeless tobacco: Never Used  . Alcohol Use: No  . Drug Use: No  . Sexual Activity: Not on file   Other Topics Concern  . Not on file   Social History Narrative   Widowed - husband passed away in 2009-05-31   lives with daughter with epilepsy - mental retardation   Retired     Former Smoker      Alcohol use-no          Occupation: Retired       son - Savannahrose Arrona     Current Outpatient Prescriptions on File Prior to Visit  Medication Sig Dispense Refill  . Alcohol Swabs (ALCOHOL PREP) 70 % PADS     . amLODipine (NORVASC) 10 MG tablet Take 1 tablet (10 mg total) by mouth daily. 90 tablet 1  . ASSURE COMFORT LANCETS  30G MISC     . Cholecalciferol (VITAMIN D) 1000 UNITS capsule Take 1,000 Units by mouth every evening.     . clopidogrel (PLAVIX) 75 MG tablet TAKE 1 TABLET BY MOUTH DAILY. 90 tablet 0  . donepezil (ARICEPT) 10 MG tablet Take 1 tablet (10 mg total) by mouth at bedtime. 90 tablet 1  . Ferrous Fumarate-Folic Acid (HEMOCYTE-F) 324-1 MG TABS Take 1 tablet by mouth daily. 30 each 3  . Glucosamine-Chondroit-Vit C-Mn (GLUCOSAMINE CHONDR 500 COMPLEX) CAPS Take 1 capsule by mouth daily.     Marland Kitchen glucose blood test strip Please dispense test strips for [facility] Advanced Glucose Meter  Check BS once daily prn  DX: E11.9 100 each 1  . Insulin Glargine (LANTUS SOLOSTAR) 100 UNIT/ML Solostar Pen INJECT 20 UNITS INTO THE SKIN AT BEDTIME. 15 pen 3  . insulin lispro (HUMALOG KWIKPEN) 100 UNIT/ML KiwkPen Inject 0.05-0.1 mLs (5-10 Units total) into the skin 3 (three) times daily with meals. 15 mL 4  . isosorbide mononitrate (IMDUR) 60 MG 24 hr tablet TAKE 1 TABLET BY MOUTH EVERY DAY 90 tablet 1  . nitroGLYCERIN (NITROSTAT) 0.4 MG SL tablet Place 0.4 mg under the tongue every 5 (five) minutes as needed for chest pain.    . Omega-3 Fatty Acids (FISH OIL) 1200 MG CAPS Take 1,200 mg by mouth daily.     . pantoprazole (PROTONIX) 40 MG tablet Take 1 tablet (40 mg total) by mouth 2 (two) times daily before a meal. 180 tablet 1  . ranitidine (ZANTAC) 300 MG tablet Take 1 tablet (300 mg total) by mouth at bedtime. 30 tablet 3  . vitamin B-12 (CYANOCOBALAMIN) 1000 MCG tablet Take 1,000 mcg by mouth daily.    . colesevelam (WELCHOL) 625 MG tablet Take 625 mg by mouth 2 (two) times daily with a meal.    . Ferrous Sulfate (IRON) 325 (65 FE) MG TABS Take 325 mg by mouth 2 (two) times daily.     . furosemide (LASIX) 20 MG tablet TAKE 1 TABLET (20 MG TOTAL) BY MOUTH DAILY. 90 tablet 1   No current facility-administered medications on file prior to visit.    No Known Allergies  Review of Systems  Review of Systems    Constitutional: Negative for fever and malaise/fatigue.  HENT: Negative for congestion.   Eyes: Negative for discharge.  Respiratory: Negative for shortness of breath.   Cardiovascular: Negative for chest pain, palpitations and leg swelling.  Gastrointestinal: Negative for nausea, abdominal pain and diarrhea.  Genitourinary: Negative for dysuria.  Musculoskeletal: Negative for falls.  Skin: Negative  for rash.  Neurological: Negative for loss of consciousness and headaches.  Endo/Heme/Allergies: Negative for polydipsia.  Psychiatric/Behavioral: Positive for depression. Negative for suicidal ideas. The patient is not nervous/anxious and does not have insomnia.     Objective  BP 140/78 mmHg  Pulse 56  Temp(Src) 97.7 F (36.5 C) (Oral)  Ht 5\' 6"  (1.676 m)  Wt 153 lb (69.4 kg)  BMI 24.71 kg/m2  SpO2 98%  Physical Exam  Physical Exam  Constitutional: She is oriented to person, place, and time and well-developed, well-nourished, and in no distress. No distress.  HENT:  Head: Normocephalic and atraumatic.  Eyes: Conjunctivae are normal.  Neck: Neck supple. No thyromegaly present.  Cardiovascular: Normal rate, regular rhythm and normal heart sounds.   Pulmonary/Chest: Effort normal and breath sounds normal. She has no wheezes.  Abdominal: She exhibits no distension and no mass.  Musculoskeletal: She exhibits no edema.  Lymphadenopathy:    She has no cervical adenopathy.  Neurological: She is alert and oriented to person, place, and time.  Skin: Skin is warm and dry. No rash noted. She is not diaphoretic.  Psychiatric: Memory, affect and judgment normal.    Lab Results  Component Value Date   TSH 2.77 06/21/2014   Lab Results  Component Value Date   WBC 6.7 06/21/2014   HGB 12.3 06/21/2014   HCT 36.8 06/21/2014   MCV 84.5 06/21/2014   PLT 279.0 06/21/2014   Lab Results  Component Value Date   CREATININE 1.37* 06/21/2014   BUN 19 06/21/2014   NA 141 06/21/2014   K  3.7 06/21/2014   CL 105 06/21/2014   CO2 30 06/21/2014   Lab Results  Component Value Date   ALT 8 06/21/2014   AST 13 06/21/2014   ALKPHOS 79 06/21/2014   BILITOT 0.6 06/21/2014   Lab Results  Component Value Date   CHOL 246* 06/21/2014   Lab Results  Component Value Date   HDL 55.70 06/21/2014   Lab Results  Component Value Date   LDLCALC 155* 06/21/2014   Lab Results  Component Value Date   TRIG 176.0* 06/21/2014   Lab Results  Component Value Date   CHOLHDL 4 06/21/2014     Assessment & Plan  Essential hypertension Well controlled, no changes to meds. Encouraged heart healthy diet such as the DASH diet and exercise as tolerated.    Diabetes mellitus type 2, insulin dependent hgba1c acceptable, minimize simple carbs. Increase exercise as tolerated. Continue current meds   Hyperlipidemia Tolerating statin, encouraged heart healthy diet, avoid trans fats, minimize simple carbs and saturated fats. Increase exercise as tolerated   Anemia Improved, no recent GI bleed   Depression Is doing well on Citalopram   Acute blood loss anemia No recent melena or blood in stool. resolved

## 2014-07-02 NOTE — Assessment & Plan Note (Signed)
Well controlled, no changes to meds. Encouraged heart healthy diet such as the DASH diet and exercise as tolerated.  °

## 2014-07-02 NOTE — Assessment & Plan Note (Signed)
Is doing well on Citalopram

## 2014-07-02 NOTE — Assessment & Plan Note (Signed)
No recent melena or blood in stool. resolved

## 2014-07-02 NOTE — Assessment & Plan Note (Signed)
Tolerating statin, encouraged heart healthy diet, avoid trans fats, minimize simple carbs and saturated fats. Increase exercise as tolerated 

## 2014-07-02 NOTE — Assessment & Plan Note (Signed)
hgba1c acceptable, minimize simple carbs. Increase exercise as tolerated. Continue current meds 

## 2014-07-02 NOTE — Assessment & Plan Note (Signed)
Improved, no recent GI bleed

## 2014-07-08 ENCOUNTER — Other Ambulatory Visit: Payer: Self-pay | Admitting: Family Medicine

## 2014-07-12 ENCOUNTER — Other Ambulatory Visit: Payer: Self-pay | Admitting: Family Medicine

## 2014-08-16 ENCOUNTER — Other Ambulatory Visit: Payer: Self-pay | Admitting: Family Medicine

## 2014-09-01 ENCOUNTER — Other Ambulatory Visit: Payer: Self-pay | Admitting: Family Medicine

## 2014-09-25 ENCOUNTER — Encounter: Payer: Self-pay | Admitting: Family Medicine

## 2014-09-25 ENCOUNTER — Other Ambulatory Visit: Payer: Self-pay | Admitting: Family Medicine

## 2014-09-25 ENCOUNTER — Ambulatory Visit (INDEPENDENT_AMBULATORY_CARE_PROVIDER_SITE_OTHER): Payer: Medicare Other | Admitting: Family Medicine

## 2014-09-25 VITALS — BP 144/74 | HR 50 | Temp 97.4°F | Ht 66.0 in | Wt 160.2 lb

## 2014-09-25 DIAGNOSIS — F418 Other specified anxiety disorders: Secondary | ICD-10-CM | POA: Diagnosis not present

## 2014-09-25 DIAGNOSIS — N184 Chronic kidney disease, stage 4 (severe): Secondary | ICD-10-CM

## 2014-09-25 DIAGNOSIS — E785 Hyperlipidemia, unspecified: Secondary | ICD-10-CM

## 2014-09-25 DIAGNOSIS — E538 Deficiency of other specified B group vitamins: Secondary | ICD-10-CM | POA: Diagnosis not present

## 2014-09-25 DIAGNOSIS — Z794 Long term (current) use of insulin: Secondary | ICD-10-CM

## 2014-09-25 DIAGNOSIS — D500 Iron deficiency anemia secondary to blood loss (chronic): Secondary | ICD-10-CM | POA: Diagnosis not present

## 2014-09-25 DIAGNOSIS — E119 Type 2 diabetes mellitus without complications: Secondary | ICD-10-CM

## 2014-09-25 DIAGNOSIS — R001 Bradycardia, unspecified: Secondary | ICD-10-CM

## 2014-09-25 DIAGNOSIS — E663 Overweight: Secondary | ICD-10-CM

## 2014-09-25 DIAGNOSIS — I10 Essential (primary) hypertension: Secondary | ICD-10-CM

## 2014-09-25 DIAGNOSIS — F329 Major depressive disorder, single episode, unspecified: Secondary | ICD-10-CM

## 2014-09-25 DIAGNOSIS — F32A Depression, unspecified: Secondary | ICD-10-CM

## 2014-09-25 MED ORDER — CITALOPRAM HYDROBROMIDE 20 MG PO TABS: 20.0000 mg | ORAL_TABLET | Freq: Every day | ORAL | Status: DC

## 2014-09-25 MED ORDER — AMLODIPINE BESYLATE 10 MG PO TABS: 10.0000 mg | ORAL_TABLET | Freq: Every day | ORAL | Status: DC

## 2014-09-25 MED ORDER — INSULIN GLARGINE 100 UNIT/ML SOLOSTAR PEN: PEN_INJECTOR | SUBCUTANEOUS | Status: DC

## 2014-09-25 MED ORDER — RANITIDINE HCL 300 MG PO TABS: 300.0000 mg | ORAL_TABLET | Freq: Every day | ORAL | Status: DC

## 2014-09-25 MED ORDER — LOVASTATIN 40 MG PO TABS: 40.0000 mg | ORAL_TABLET | Freq: Every day | ORAL | Status: DC

## 2014-09-25 MED ORDER — GLUCOSE BLOOD VI STRP: ORAL_STRIP | Status: DC

## 2014-09-25 MED ORDER — INSULIN PEN NEEDLE 31G X 8 MM MISC: Status: DC

## 2014-09-25 MED ORDER — FUROSEMIDE 20 MG PO TABS: ORAL_TABLET | ORAL | Status: DC

## 2014-09-25 MED ORDER — DONEPEZIL HCL 10 MG PO TABS: 10.0000 mg | ORAL_TABLET | Freq: Every day | ORAL | Status: DC

## 2014-09-25 NOTE — Patient Instructions (Signed)

## 2014-09-25 NOTE — Progress Notes (Signed)
Pre visit review using our clinic review tool, if applicable. No additional management support is needed unless otherwise documented below in the visit note. 

## 2014-09-26 LAB — COMPREHENSIVE METABOLIC PANEL
ALBUMIN: 3.6 g/dL (ref 3.5–5.2)
ALT: 11 U/L (ref 0–35)
AST: 11 U/L (ref 0–37)
Alkaline Phosphatase: 69 U/L (ref 39–117)
BUN: 27 mg/dL — ABNORMAL HIGH (ref 6–23)
CO2: 28 mEq/L (ref 19–32)
CREATININE: 1.61 mg/dL — AB (ref 0.40–1.20)
Calcium: 9.5 mg/dL (ref 8.4–10.5)
Chloride: 104 mEq/L (ref 96–112)
GFR: 32.61 mL/min — ABNORMAL LOW (ref >60.00)
Glucose, Bld: 261 mg/dL — ABNORMAL HIGH (ref 70–99)
POTASSIUM: 4.1 meq/L (ref 3.5–5.1)
Sodium: 140 mEq/L (ref 135–145)
Total Bilirubin: 0.7 mg/dL (ref 0.2–1.2)
Total Protein: 7 g/dL (ref 6.0–8.3)

## 2014-09-26 LAB — LIPID PANEL
Cholesterol: 263 mg/dL — ABNORMAL HIGH (ref 0–200)
HDL: 51.6 mg/dL (ref >39.00)
NONHDL: 211.4
TRIGLYCERIDES: 358 mg/dL — AB (ref 0.0–149.0)
Total CHOL/HDL Ratio: 5
VLDL: 71.6 mg/dL — ABNORMAL HIGH (ref 0.0–40.0)

## 2014-09-26 LAB — CBC
HCT: 39.2 % (ref 36.0–46.0)
Hemoglobin: 13.2 g/dL (ref 12.0–15.0)
MCHC: 33.8 g/dL (ref 30.0–36.0)
MCV: 88.1 fl (ref 78.0–100.0)
Platelets: 213 10*3/uL (ref 150.0–400.0)
RBC: 4.45 Mil/uL (ref 3.87–5.11)
RDW: 14.7 % (ref 11.5–15.5)
WBC: 8.4 10*3/uL (ref 4.0–10.5)

## 2014-09-26 LAB — LDL CHOLESTEROL, DIRECT: Direct LDL: 162 mg/dL

## 2014-09-26 LAB — TSH: TSH: 2.08 u[IU]/mL (ref 0.35–4.50)

## 2014-09-26 LAB — HEMOGLOBIN A1C: Hgb A1c MFr Bld: 8.4 % — ABNORMAL HIGH (ref 4.6–6.5)

## 2014-09-26 LAB — VITAMIN B12: Vitamin B-12: 570 pg/mL (ref 211–911)

## 2014-09-27 ENCOUNTER — Other Ambulatory Visit: Payer: Self-pay | Admitting: Family Medicine

## 2014-09-27 NOTE — Telephone Encounter (Signed)
Updated medication list.  We had removed plavix at [facility] as family stated not taking.  But they did call back to inform their mistake, she is still taking it everyday.

## 2014-09-28 ENCOUNTER — Other Ambulatory Visit: Payer: Self-pay | Admitting: Family Medicine

## 2014-09-28 MED ORDER — ATORVASTATIN CALCIUM 40 MG PO TABS: 40.0000 mg | ORAL_TABLET | Freq: Every day | ORAL | Status: DC

## 2014-10-03 ENCOUNTER — Encounter: Payer: Self-pay | Admitting: Family Medicine

## 2014-10-06 ENCOUNTER — Encounter: Payer: Self-pay | Admitting: Family Medicine

## 2014-10-07 ENCOUNTER — Encounter: Payer: Self-pay | Admitting: Family Medicine

## 2014-10-07 NOTE — Assessment & Plan Note (Signed)
Stable asymptomatic 

## 2014-10-07 NOTE — Assessment & Plan Note (Signed)
Will try Atorvastatin 40 mg qhs and Tolerating statin, encouraged heart healthy diet, avoid trans fats, minimize simple carbs and saturated fats. Increase exercise as tolerated

## 2014-10-07 NOTE — Assessment & Plan Note (Signed)
Good weight after a period of unintentional weight loss earlier this year.  Continue a heart healthy diet

## 2014-10-07 NOTE — Assessment & Plan Note (Signed)
Doing well on Citalopram, eating better and mood is improved per patient and family

## 2014-10-07 NOTE — Assessment & Plan Note (Addendum)
Improved on recheck. Well controlled, no changes to meds. Encouraged heart healthy diet such as the DASH diet and exercise as tolerated.  

## 2014-10-07 NOTE — Progress Notes (Signed)
Destiny [name] [id number] January 23, 1932 10/07/2014      Progress Note New Patient  Subjective  Chief Complaint  Chief Complaint  Patient presents with  . Follow-up    HPI  Patient is a 81 year old female in today for routine medical care. She is in today accompanied by family. She is doing very well. No recent illness. Her mood is improved and she is eating better. No evidence of recent GI bleed. No black or bloody stool. Her sugars continue to run hi, sometimes in to 300s but usually in 100s and low 200s. Some polyuria is noted but no polydipsia. Denies CP/palp/SOB/HA/congestion/fevers/GI c/o. Taking meds as prescribed Past Medical History  Diagnosis Date  . PVD (peripheral vascular disease)   . CAD (coronary artery disease)     a. s/p CABG x 4 (LIMA->LAD, VG->Diag, VG->OM1->OM3);  b. 2010 Cath: 3/4 patent grafts (VG->diag occluded), 3vd, sev PVD ->Med Rx;  c. 12/2011 Lexi MV: sm area of mild mid and apical ant wall ischemia ->med rx.  . Hypertension   . Hyperlipidemia   . Duodenal ulcer 2009 and 11/2011    caused GI bleeding  . Iron deficiency anemia secondary to blood loss (chronic)   . Amaurosis fugax   . Carotid bruit   . Diabetes mellitus type II     insulin dependent.   . Dementia     pt denies  . Diabetic retinopathy   . Gallstones 2009    seen on CT scan  . Diverticulosis   . Atherosclerosis   . Chronic kidney disease (CKD) stage G4/A1, severely decreased glomerular filtration rate (GFR) between 15-29 mL/min/1.73 square meter and albuminuria creatinine ratio less than 30 mg/g     GFR 25 on 12/25/13  . CVA (cerebral infarction)   . Diabetic peripheral neuropathy   . GI bleed 04/2011, 11/2011    found non-bleeding duodenal ulcers  . Arthritis 12/06/2012  . Hypersomnolence 03/12/2013  . Acute renal insufficiency 12/24/2013  . Depression 12/24/2013  . Melena 1015/2015    PEPTIC ULCER    REQUIRING TRANSFUSION  . Weight loss, non-intentional 01/29/2014  . UTI (urinary  tract infection) 04/08/2014    Past Surgical History  Procedure Laterality Date  . Coronary artery bypass graft  1999    x 4  . Abdominal hysterectomy    . Upper gastrointestinal endoscopy  02/22/2008    w/biopsy, duedenal ulcer, antrum erosions  . Esophagogastroduodenoscopy  05/16/2011    Procedure: ESOPHAGOGASTRODUODENOSCOPY (EGD);  Surgeon: Gatha Mayer, MD;  Location: [facility] ENDOSCOPY;  Service: Endoscopy;  Laterality: N/A;  . Colonoscopy  05/20/2011    Procedure: COLONOSCOPY;  Surgeon: Scarlette Shorts, MD;  Location: [place];  Service: Endoscopy;  Laterality: N/A;  . Esophagogastroduodenoscopy  12/08/2011    Procedure: ESOPHAGOGASTRODUODENOSCOPY (EGD);  Surgeon: Ladene Artist, MD,FACG;  Location: [facility] ENDOSCOPY;  Service: Endoscopy;  Laterality: N/A;  . Enteroscopy N/A 12/29/2013    Procedure: ENTEROSCOPY;  Surgeon: Inda Castle, MD;  Location: [place];  Service: Endoscopy;  Laterality: N/A;    Family History  Problem Relation Age of Onset  . Coronary artery disease Father   . Hypertension Father   . Stroke Father   . Cancer Mother     ?  . Diabetes Maternal Uncle   . Colon cancer Neg Hx     History   Social History  . Marital Status: Widowed    Spouse Name: N/A  . Number of Children: 4  . Years of Education: N/A  Occupational History  . retired    Social History Main Topics  . Smoking status: Former Smoker    Types: Cigarettes    Quit date: 12/31/2000  . Smokeless tobacco: Never Used  . Alcohol Use: No  . Drug Use: No  . Sexual Activity: Not on file   Other Topics Concern  . Not on file   Social History Narrative   Widowed - husband passed away in 05-27-2009   lives with daughter with epilepsy - mental retardation   Retired     Former Smoker      Alcohol use-no          Occupation: Retired       son - Haly Bunda     Current Outpatient Prescriptions on File Prior to Visit  Medication Sig Dispense Refill  . Alcohol Swabs (ALCOHOL PREP) 70 % PADS      . ASSURE COMFORT LANCETS 30G MISC     . Cholecalciferol (VITAMIN D) 1000 UNITS capsule Take 1,000 Units by mouth every evening.     . Ferrous Sulfate (IRON) 325 (65 FE) MG TABS Take 325 mg by mouth 2 (two) times daily.     Marland Kitchen HEMATINIC/FOLIC ACID [id number] MG TABS TAKE 1 TABLET BY MOUTH DAILY. 30 each 3  . insulin lispro (HUMALOG KWIKPEN) 100 UNIT/ML KiwkPen Inject 0.05-0.1 mLs (5-10 Units total) into the skin 3 (three) times daily with meals. 15 mL 4  . nitroGLYCERIN (NITROSTAT) 0.4 MG SL tablet Place 0.4 mg under the tongue every 5 (five) minutes as needed for chest pain.    . Omega-3 Fatty Acids (FISH OIL) 1200 MG CAPS Take 1,200 mg by mouth daily.     . pantoprazole (PROTONIX) 40 MG tablet Take 1 tablet (40 mg total) by mouth 2 (two) times daily before a meal. (Patient taking differently: Taking Monday, Wednesday and Friday only.) 180 tablet 1   No current facility-administered medications on file prior to visit.    No Known Allergies  Review of Systems  Review of Systems  Constitutional: Positive for malaise/fatigue. Negative for fever.  HENT: Negative for congestion.   Eyes: Negative for discharge.  Respiratory: Negative for shortness of breath.   Cardiovascular: Negative for chest pain, palpitations and leg swelling.  Gastrointestinal: Negative for nausea, abdominal pain and diarrhea.  Genitourinary: Negative for dysuria.  Musculoskeletal: Negative for falls.  Skin: Negative for rash.  Neurological: Negative for loss of consciousness and headaches.  Endo/Heme/Allergies: Negative for polydipsia.  Psychiatric/Behavioral: Negative for depression and suicidal ideas. The patient is not nervous/anxious and does not have insomnia.     Objective  BP 144/74 mmHg  Pulse 50  Temp(Src) 97.4 F (36.3 C) (Oral)  Ht 5\' 6"  (1.676 m)  Wt 160 lb 4 oz (72.689 kg)  BMI 25.88 kg/m2  SpO2 97%  Physical Exam  Physical Exam  Constitutional: She is oriented to person, place, and time and  well-developed, well-nourished, and in no distress. No distress.  HENT:  Head: Normocephalic and atraumatic.  Eyes: Conjunctivae are normal.  Neck: Neck supple. No thyromegaly present.  Cardiovascular: Regular rhythm and normal heart sounds.   bradycardia  Pulmonary/Chest: Effort normal and breath sounds normal. She has no wheezes.  Abdominal: She exhibits no distension and no mass.  Musculoskeletal: She exhibits no edema.  Lymphadenopathy:    She has no cervical adenopathy.  Neurological: She is alert and oriented to person, place, and time.  Skin: Skin is warm and dry. No rash noted. She is  not diaphoretic.  Psychiatric: Memory, affect and judgment normal.       Assessment & Plan  Overweight (BMI 25.0-29.9) Good weight after a period of unintentional weight loss earlier this year.  Continue a heart healthy diet  Essential hypertension Improved on recheck. Well controlled, no changes to meds. Encouraged heart healthy diet such as the DASH diet and exercise as tolerated.   Diabetes mellitus type 2, insulin dependent hgba1c improved slightly, encouraged to increase Lantus to 22 units as tolerated , minimize simple carbs. Increase exercise as tolerated. Continue current meds  Bradycardia Stable asymptomatic  CKD (chronic kidney disease) stage 4, GFR 15-29 ml/min Follows with nephrology, stable  Depression Doing well on Citalopram, eating better and mood is improved per patient and family  Hyperlipidemia Will try Atorvastatin 40 mg qhs and Tolerating statin, encouraged heart healthy diet, avoid trans fats, minimize simple carbs and saturated fats. Increase exercise as tolerated

## 2014-10-07 NOTE — Assessment & Plan Note (Signed)
Follows with nephrology, stable

## 2014-10-07 NOTE — Assessment & Plan Note (Addendum)
hgba1c improved slightly, encouraged to increase Lantus to 22 units as tolerated , minimize simple carbs. Increase exercise as tolerated. Continue current meds

## 2014-10-08 ENCOUNTER — Telehealth: Payer: Self-pay | Admitting: *Deleted

## 2014-10-08 ENCOUNTER — Other Ambulatory Visit: Payer: Self-pay | Admitting: *Deleted

## 2014-10-08 MED ORDER — GLUCOSE BLOOD VI STRP: ORAL_STRIP | Status: DC

## 2014-10-08 MED ORDER — INSULIN PEN NEEDLE 31G X 8 MM MISC: Status: DC

## 2014-10-08 MED ORDER — CVS BLOOD GLUCOSE METER W/DEVICE KIT: 1.0000 | PACK | Freq: Once | Status: DC

## 2014-10-08 NOTE — Telephone Encounter (Signed)
Glucose meter reordered per patient MyChart request.  [facility]ed [facility] spoke [facility] who stated it was covered by insurance.

## 2014-10-13 ENCOUNTER — Encounter: Payer: Self-pay | Admitting: Family Medicine

## 2014-10-15 ENCOUNTER — Other Ambulatory Visit: Payer: Self-pay | Admitting: Family Medicine

## 2014-10-15 MED ORDER — GLUCOSE BLOOD VI STRP: ORAL_STRIP | Status: DC

## 2014-10-15 MED ORDER — ASSURE COMFORT LANCETS 30G MISC: Status: DC

## 2014-11-01 ENCOUNTER — Other Ambulatory Visit: Payer: Self-pay | Admitting: Family Medicine

## 2014-11-01 MED ORDER — RANITIDINE HCL 300 MG PO TABS: 300.0000 mg | ORAL_TABLET | Freq: Every day | ORAL | Status: DC

## 2014-11-05 ENCOUNTER — Other Ambulatory Visit: Payer: Self-pay | Admitting: Family Medicine

## 2014-11-05 DIAGNOSIS — F418 Other specified anxiety disorders: Secondary | ICD-10-CM

## 2014-11-05 MED ORDER — ATORVASTATIN CALCIUM 40 MG PO TABS: 40.0000 mg | ORAL_TABLET | Freq: Every day | ORAL | Status: DC

## 2014-11-05 MED ORDER — CITALOPRAM HYDROBROMIDE 20 MG PO TABS: 20.0000 mg | ORAL_TABLET | Freq: Every day | ORAL | Status: DC

## 2014-11-06 ENCOUNTER — Telehealth: Payer: Self-pay | Admitting: Family Medicine

## 2014-11-06 NOTE — Telephone Encounter (Signed)
error:[id number] ° °

## 2014-11-26 ENCOUNTER — Telehealth: Payer: Self-pay | Admitting: Family Medicine

## 2014-11-26 NOTE — Telephone Encounter (Signed)
Destiny [name] with [facility] Heart Failure case mgmt program Phone# [phone] x [phone] ok to leave VM on confidential line  Fax# [phone]  EF from the Echo is needed as well as confirmation of dx CHF. Please call or fax to above.

## 2014-11-28 NOTE — Telephone Encounter (Signed)
Called nurse at [facility] and left a msg. We do not have a record of an echo in patients chart, there is an encounter on 03/15/13 but unable to see a report.

## 2014-11-29 NOTE — Telephone Encounter (Signed)
Caller name: Tawanna Sat from [facility] Failure case mgmt program  Call back number: [phone] ext [phone]    Reason for call:  Tawanna Sat has follow up questing regarding message below.

## 2014-12-04 NOTE — Progress Notes (Unsigned)
Per Reynolds Bowl, case manager with [facility] please order ECHO on patient when she is in office again.   They are unable to admit her to their program because of her not having information supporting a diagnosis for Heart Failure.

## 2014-12-04 NOTE — Telephone Encounter (Signed)
Caller name: Tawanna Sat from [facility] Heart Failure case mgmt program Relationship to patient: Can be reached: [phone] est [phone] Pharmacy:  Reason for call: She is needing confirmation of echo and how pt was diagnosed with CHF Also asking about pt meds for CHF. Please call.

## 2014-12-04 NOTE — Telephone Encounter (Signed)
Called [employer] at [phone] ext. [phone] left a detailed message to [facility] with [facility] to call back in regards to information needed from PCP.

## 2014-12-04 NOTE — Telephone Encounter (Signed)
Notify echo in 2009 showed possible mild diastolic dysfunction but patient has not been symptomatic in the time we have cared for her so we have not altered meds. Diagnosis was actually put in chart by hospitalists in 2015. Would not change meds without seeing patient. Can come in and repeat Echo

## 2014-12-06 NOTE — Telephone Encounter (Signed)
Called once again left a detailed message [facility] RN to call back.

## 2014-12-15 ENCOUNTER — Other Ambulatory Visit: Payer: Self-pay | Admitting: Family Medicine

## 2014-12-27 ENCOUNTER — Ambulatory Visit (INDEPENDENT_AMBULATORY_CARE_PROVIDER_SITE_OTHER): Payer: Medicare Other | Admitting: Family Medicine

## 2014-12-27 ENCOUNTER — Encounter: Payer: Self-pay | Admitting: Family Medicine

## 2014-12-27 VITALS — BP 126/68 | HR 68 | Temp 98.1°F | Ht 66.0 in | Wt 157.2 lb

## 2014-12-27 DIAGNOSIS — F329 Major depressive disorder, single episode, unspecified: Secondary | ICD-10-CM

## 2014-12-27 DIAGNOSIS — E119 Type 2 diabetes mellitus without complications: Secondary | ICD-10-CM

## 2014-12-27 DIAGNOSIS — E538 Deficiency of other specified B group vitamins: Secondary | ICD-10-CM | POA: Diagnosis not present

## 2014-12-27 DIAGNOSIS — I10 Essential (primary) hypertension: Secondary | ICD-10-CM

## 2014-12-27 DIAGNOSIS — Z794 Long term (current) use of insulin: Secondary | ICD-10-CM

## 2014-12-27 DIAGNOSIS — D500 Iron deficiency anemia secondary to blood loss (chronic): Secondary | ICD-10-CM | POA: Diagnosis not present

## 2014-12-27 DIAGNOSIS — E785 Hyperlipidemia, unspecified: Secondary | ICD-10-CM

## 2014-12-27 DIAGNOSIS — F32A Depression, unspecified: Secondary | ICD-10-CM

## 2014-12-27 NOTE — Progress Notes (Signed)
Pre visit review using our clinic review tool, if applicable. No additional management support is needed unless otherwise documented below in the visit note. 

## 2014-12-27 NOTE — Patient Instructions (Signed)
Meals on [employer], coupons on line    Hypertension Hypertension, commonly called high blood pressure, is when the force of blood pumping through your arteries is too strong. Your arteries are the blood vessels that carry blood from your heart throughout your body. A blood pressure reading consists of a higher number over a lower number, such as 110/72. The higher number (systolic) is the pressure inside your arteries when your heart pumps. The lower number (diastolic) is the pressure inside your arteries when your heart relaxes. Ideally you want your blood pressure below 120/80. Hypertension forces your heart to work harder to pump blood. Your arteries may become narrow or stiff. Having untreated or uncontrolled hypertension can cause heart attack, stroke, kidney disease, and other problems. RISK FACTORS Some risk factors for high blood pressure are controllable. Others are not.  Risk factors you cannot control include:   Race. You may be at higher risk if you are African American.  Age. Risk increases with age.  Gender. Men are at higher risk than women before age 66 years. After age 52, women are at higher risk than men. Risk factors you can control include:  Not getting enough exercise or physical activity.  Being overweight.  Getting too much fat, sugar, calories, or salt in your diet.  Drinking too much alcohol. SIGNS AND SYMPTOMS Hypertension does not usually cause signs or symptoms. Extremely high blood pressure (hypertensive crisis) may cause headache, anxiety, shortness of breath, and nosebleed. DIAGNOSIS To check if you have hypertension, your health care provider will measure your blood pressure while you are seated, with your arm held at the level of your heart. It should be measured at least twice using the same arm. Certain conditions can cause a difference in blood pressure between your right and left arms. A blood pressure reading that is higher than normal on one  occasion does not mean that you need treatment. If it is not clear whether you have high blood pressure, you may be asked to return on a different day to have your blood pressure checked again. Or, you may be asked to monitor your blood pressure at home for 1 or more weeks. TREATMENT Treating high blood pressure includes making lifestyle changes and possibly taking medicine. Living a healthy lifestyle can help lower high blood pressure. You may need to change some of your habits. Lifestyle changes may include:  Following the DASH diet. This diet is high in fruits, vegetables, and whole grains. It is low in salt, red meat, and added sugars.  Keep your sodium intake below 2,300 mg per day.  Getting at least 30-45 minutes of aerobic exercise at least 4 times per week.  Losing weight if necessary.  Not smoking.  Limiting alcoholic beverages.  Learning ways to reduce stress. Your health care provider may prescribe medicine if lifestyle changes are not enough to get your blood pressure under control, and if one of the following is true:  You are 30-49 years of age and your systolic blood pressure is above 140.  You are 31 years of age or older, and your systolic blood pressure is above 150.  Your diastolic blood pressure is above 90.  You have diabetes, and your systolic blood pressure is over [id number] or your diastolic blood pressure is over 90.  You have kidney disease and your blood pressure is above 140/90.  You have heart disease and your blood pressure is above 140/90. Your personal target blood pressure may vary depending on your  medical conditions, your age, and other factors. HOME CARE INSTRUCTIONS  Have your blood pressure rechecked as directed by your health care provider.   Take medicines only as directed by your health care provider. Follow the directions carefully. Blood pressure medicines must be taken as prescribed. The medicine does not work as well when you skip doses.  Skipping doses also puts you at risk for problems.  Do not smoke.   Monitor your blood pressure at home as directed by your health care provider. SEEK MEDICAL CARE IF:   You think you are having a reaction to medicines taken.  You have recurrent headaches or feel dizzy.  You have swelling in your ankles.  You have trouble with your vision. SEEK IMMEDIATE MEDICAL CARE IF:  You develop a severe headache or confusion.  You have unusual weakness, numbness, or feel faint.  You have severe chest or abdominal pain.  You vomit repeatedly.  You have trouble breathing. MAKE SURE YOU:   Understand these instructions.  Will watch your condition.  Will get help right away if you are not doing well or get worse.   This information is not intended to replace advice given to you by your health care provider. Make sure you discuss any questions you have with your health care provider.   Document Released: 03/02/2005 Document Revised: 07/17/2014 Document Reviewed: 12/23/2012 Elsevier Interactive Patient Education [employer].

## 2014-12-31 ENCOUNTER — Other Ambulatory Visit (INDEPENDENT_AMBULATORY_CARE_PROVIDER_SITE_OTHER): Payer: Medicare Other

## 2014-12-31 ENCOUNTER — Telehealth: Payer: Self-pay | Admitting: Family Medicine

## 2014-12-31 DIAGNOSIS — E119 Type 2 diabetes mellitus without complications: Secondary | ICD-10-CM

## 2014-12-31 DIAGNOSIS — E538 Deficiency of other specified B group vitamins: Secondary | ICD-10-CM

## 2014-12-31 DIAGNOSIS — I10 Essential (primary) hypertension: Secondary | ICD-10-CM | POA: Diagnosis not present

## 2014-12-31 DIAGNOSIS — E785 Hyperlipidemia, unspecified: Secondary | ICD-10-CM | POA: Diagnosis not present

## 2014-12-31 DIAGNOSIS — D500 Iron deficiency anemia secondary to blood loss (chronic): Secondary | ICD-10-CM

## 2014-12-31 DIAGNOSIS — Z794 Long term (current) use of insulin: Secondary | ICD-10-CM | POA: Diagnosis not present

## 2014-12-31 LAB — COMPREHENSIVE METABOLIC PANEL
ALK PHOS: 84 U/L (ref 39–117)
ALT: 11 U/L (ref 0–35)
AST: 14 U/L (ref 0–37)
Albumin: 3.4 g/dL — ABNORMAL LOW (ref 3.5–5.2)
BUN: 25 mg/dL — ABNORMAL HIGH (ref 6–23)
CHLORIDE: 106 meq/L (ref 96–112)
CO2: 29 mEq/L (ref 19–32)
Calcium: 9.4 mg/dL (ref 8.4–10.5)
Creatinine, Ser: 1.7 mg/dL — ABNORMAL HIGH (ref 0.40–1.20)
GFR: 30.6 mL/min — AB (ref >60.00)
GLUCOSE: 140 mg/dL — AB (ref 70–99)
POTASSIUM: 3.9 meq/L (ref 3.5–5.1)
SODIUM: 142 meq/L (ref 135–145)
Total Bilirubin: 1 mg/dL (ref 0.2–1.2)
Total Protein: 6.7 g/dL (ref 6.0–8.3)

## 2014-12-31 LAB — CBC
HEMATOCRIT: 38.5 % (ref 36.0–46.0)
HEMOGLOBIN: 12.7 g/dL (ref 12.0–15.0)
MCHC: 33 g/dL (ref 30.0–36.0)
MCV: 89.7 fl (ref 78.0–100.0)
PLATELETS: 233 10*3/uL (ref 150.0–400.0)
RBC: 4.29 Mil/uL (ref 3.87–5.11)
RDW: 14.5 % (ref 11.5–15.5)
WBC: 8.6 10*3/uL (ref 4.0–10.5)

## 2014-12-31 LAB — LIPID PANEL
CHOLESTEROL: 208 mg/dL — AB (ref 0–200)
HDL: 45.6 mg/dL (ref >39.00)
LDL Cholesterol: 129 mg/dL — ABNORMAL HIGH (ref 0–99)
NonHDL: 162.43
Total CHOL/HDL Ratio: 5
Triglycerides: 167 mg/dL — ABNORMAL HIGH (ref 0.0–149.0)
VLDL: 33.4 mg/dL (ref 0.0–40.0)

## 2014-12-31 LAB — TSH: TSH: 2.86 u[IU]/mL (ref 0.35–4.50)

## 2014-12-31 LAB — MICROALBUMIN / CREATININE URINE RATIO
CREATININE, U: 141.6 mg/dL
MICROALB UR: 89.6 mg/dL — AB (ref 0.0–1.9)
MICROALB/CREAT RATIO: 63.3 mg/g — AB (ref 0.0–30.0)

## 2014-12-31 LAB — HEMOGLOBIN A1C: HEMOGLOBIN A1C: 7.2 % — AB (ref 4.6–6.5)

## 2014-12-31 MED ORDER — PANTOPRAZOLE SODIUM 40 MG PO TBEC: DELAYED_RELEASE_TABLET | ORAL | Status: DC

## 2014-12-31 NOTE — Telephone Encounter (Signed)
Ok to schedule for flu shot per Dr. Frederik Pear ok.  Let patient know we did change protonix as requested and sent to the pharmacy.

## 2014-12-31 NOTE — Telephone Encounter (Signed)
Caller name: Mikaylie Relation to pt: self Call back number: [phone] Pharmacy:  Reason for call: Pt came in office for lab appt and wanted to see if she can have a Flu vaccine done and also wanted to inform that if Protonix can be changed to 1 tab every Monday, Wed, Friday. Please advise.

## 2014-12-31 NOTE — Telephone Encounter (Signed)
OK to give flu shot and OK to change Protonix to requested sig, 30 day supply with 5 rf or 90 day supply with 1 rf

## 2015-01-04 ENCOUNTER — Other Ambulatory Visit: Payer: Self-pay | Admitting: Family Medicine

## 2015-01-05 ENCOUNTER — Encounter: Payer: Self-pay | Admitting: Family Medicine

## 2015-01-06 ENCOUNTER — Encounter: Payer: Self-pay | Admitting: Family Medicine

## 2015-01-06 NOTE — Progress Notes (Signed)
Subjective:    Patient ID: Destiny [name], female    DOB: 30-Dec-1931, 81 y.o.   MRN: [id number]  Chief Complaint  Patient presents with  . Follow-up    3 month    HPI Patient is in today for follow-up. Unfortunately she is just suffered to the sudden death of her daughter. This was the daughter lives with her and help to care for her. She is a, and by her son. She is sad and tearful in the visit but is managing on a daily basis. Does feel a Celexa is helping her to get through. No recent illness or other acute complaints. Does continue to struggle with some intermittent swelling most notably in her right foot improved after lying down at night. No polyuria or polydipsia. Denies CP/palp/SOB/HA/congestion/fevers/GI or GU c/o. Taking meds as prescribed  Past Medical History  Diagnosis Date  . PVD (peripheral vascular disease) ([place])   . CAD (coronary artery disease)     a. s/p CABG x 4 (LIMA->LAD, VG->Diag, VG->OM1->OM3);  b. 2010 Cath: 3/4 patent grafts (VG->diag occluded), 3vd, sev PVD ->Med Rx;  c. 12/2011 Lexi MV: sm area of mild mid and apical ant wall ischemia ->med rx.  . Hypertension   . Hyperlipidemia   . Duodenal ulcer 2009 and 11/2011    caused GI bleeding  . Iron deficiency anemia secondary to blood loss (chronic)   . Amaurosis fugax   . Carotid bruit   . Diabetes mellitus type II     insulin dependent.   . Dementia     pt denies  . Diabetic retinopathy   . Gallstones 2009    seen on CT scan  . Diverticulosis   . Atherosclerosis   . Chronic kidney disease (CKD) stage G4/A1, severely decreased glomerular filtration rate (GFR) between 15-29 mL/min/1.73 square meter and albuminuria creatinine ratio less than 30 mg/g (HCC)     GFR 25 on 12/25/13  . CVA (cerebral infarction)   . Diabetic peripheral neuropathy ([place])   . GI bleed 04/2011, 11/2011    found non-bleeding duodenal ulcers  . Arthritis 12/06/2012  . Hypersomnolence 03/12/2013  . Acute renal insufficiency  12/24/2013  . Depression 12/24/2013  . Melena 1015/2015    PEPTIC ULCER    REQUIRING TRANSFUSION  . Weight loss, non-intentional 01/29/2014  . UTI (urinary tract infection) 04/08/2014    Past Surgical History  Procedure Laterality Date  . Coronary artery bypass graft  1999    x 4  . Abdominal hysterectomy    . Upper gastrointestinal endoscopy  02/22/2008    w/biopsy, duedenal ulcer, antrum erosions  . Esophagogastroduodenoscopy  05/16/2011    Procedure: ESOPHAGOGASTRODUODENOSCOPY (EGD);  Surgeon: Gatha Mayer, MD;  Location: [facility] ENDOSCOPY;  Service: Endoscopy;  Laterality: N/A;  . Colonoscopy  05/20/2011    Procedure: COLONOSCOPY;  Surgeon: Scarlette Shorts, MD;  Location: [place];  Service: Endoscopy;  Laterality: N/A;  . Esophagogastroduodenoscopy  12/08/2011    Procedure: ESOPHAGOGASTRODUODENOSCOPY (EGD);  Surgeon: Ladene Artist, MD,FACG;  Location: [facility] ENDOSCOPY;  Service: Endoscopy;  Laterality: N/A;  . Enteroscopy N/A 12/29/2013    Procedure: ENTEROSCOPY;  Surgeon: Inda Castle, MD;  Location: [place];  Service: Endoscopy;  Laterality: N/A;    Family History  Problem Relation Age of Onset  . Coronary artery disease Father   . Hypertension Father   . Stroke Father   . Cancer Mother     ?  . Diabetes Maternal Uncle   . Colon cancer  Neg Hx     Social History   Social History  . Marital Status: Widowed    Spouse Name: N/A  . Number of Children: 4  . Years of Education: N/A   Occupational History  . retired    Social History Main Topics  . Smoking status: Former Smoker    Types: Cigarettes    Quit date: 12/31/2000  . Smokeless tobacco: Never Used  . Alcohol Use: No  . Drug Use: No  . Sexual Activity: Not on file   Other Topics Concern  . Not on file   Social History Narrative   Widowed - husband passed away in 05/10/09   lives with daughter with epilepsy - mental retardation   Retired     Former Smoker      Alcohol use-no          Occupation:  Retired       son - Pelagia Iacobucci     Outpatient Prescriptions Prior to Visit  Medication Sig Dispense Refill  . Alcohol Swabs (ALCOHOL PREP) 70 % PADS     . amLODipine (NORVASC) 10 MG tablet Take 1 tablet (10 mg total) by mouth daily. 90 tablet 1  . ASSURE COMFORT LANCETS 30G MISC Use once daily to check blood sugar.  DX E11.9 100 each 6  . Blood Glucose Monitoring Suppl ([employer] BLOOD GLUCOSE METER) W/DEVICE KIT 1 Device by Does not apply route once. 1 kit 0  . Cholecalciferol (VITAMIN D) 1000 UNITS capsule Take 1,000 Units by mouth every evening.     . citalopram (CELEXA) 20 MG tablet Take 1 tablet (20 mg total) by mouth daily. 90 tablet 1  . clopidogrel (PLAVIX) 75 MG tablet Take 75 mg by mouth daily.    Marland Kitchen donepezil (ARICEPT) 10 MG tablet Take 1 tablet (10 mg total) by mouth at bedtime. 90 tablet 1  . Ferrous Sulfate (IRON) 325 (65 FE) MG TABS Take 325 mg by mouth 2 (two) times daily.     . furosemide (LASIX) 20 MG tablet TAKE 1 TABLET (20 MG TOTAL) BY MOUTH DAILY. 90 tablet 1  . glucose blood test strip Please dispense test strips for [employer] Brand Advanced Glucose Meter.  Check BS once daily prn.  DX: E11.9 100 each 6  . Insulin Glargine (LANTUS SOLOSTAR) 100 UNIT/ML Solostar Pen INJECT 20-22 UNITS INTO THE SKIN in AM 15 pen 3  . insulin lispro (HUMALOG KWIKPEN) 100 UNIT/ML KiwkPen Inject 0.05-0.1 mLs (5-10 Units total) into the skin 3 (three) times daily with meals. 15 mL 4  . Insulin Pen Needle (B-D ULTRAFINE III SHORT PEN) 31G X 8 MM MISC Use as directed for insulin 100 each 6  . isosorbide mononitrate (IMDUR) 60 MG 24 hr tablet TAKE 1 TABLET BY MOUTH EVERY DAY 90 tablet 0  . isosorbide mononitrate (IMDUR) 60 MG 24 hr tablet TAKE 1 TABLET BY MOUTH EVERY DAY 90 tablet 1  . nitroGLYCERIN (NITROSTAT) 0.4 MG SL tablet Place 0.4 mg under the tongue every 5 (five) minutes as needed for chest pain.    . Omega-3 Fatty Acids (FISH OIL) 1200 MG CAPS Take 1,200 mg by mouth daily.     . pantoprazole  (PROTONIX) 40 MG tablet Take 1 tablet (40 mg total) by mouth 2 (two) times daily before a meal. (Patient taking differently: Take 40 mg by mouth daily. ) 180 tablet 1  . HEMATINIC/FOLIC ACID 300-9 MG TABS TAKE 1 TABLET BY MOUTH DAILY. (Patient not taking: Reported on  12/27/2014) 30 each 3  . atorvastatin (LIPITOR) 40 MG tablet Take 1 tablet (40 mg total) by mouth daily. (Patient not taking: Reported on 12/27/2014) 90 tablet 1  . ranitidine (ZANTAC) 300 MG tablet Take 1 tablet (300 mg total) by mouth at bedtime. (Patient not taking: Reported on 12/27/2014) 90 tablet 1   No facility-administered medications prior to visit.    No Known Allergies  Review of Systems  Constitutional: Negative for fever and malaise/fatigue.  HENT: Negative for congestion.   Eyes: Negative for discharge.  Respiratory: Negative for shortness of breath.   Cardiovascular: Negative for chest pain, palpitations and leg swelling.  Gastrointestinal: Negative for nausea and abdominal pain.  Genitourinary: Negative for dysuria.  Musculoskeletal: Negative for falls.  Skin: Negative for rash.  Neurological: Negative for loss of consciousness and headaches.  Endo/Heme/Allergies: Negative for environmental allergies.  Psychiatric/Behavioral: Positive for depression. The patient is nervous/anxious.        Objective:    Physical Exam  Constitutional: She is oriented to person, place, and time. She appears well-developed and well-nourished. No distress.  HENT:  Head: Normocephalic and atraumatic.  Nose: Nose normal.  Eyes: Right eye exhibits no discharge. Left eye exhibits no discharge.  Neck: Normal range of motion. Neck supple.  Cardiovascular: Normal rate and regular rhythm.   No murmur heard. Pulmonary/Chest: Effort normal and breath sounds normal.  Abdominal: Soft. Bowel sounds are normal. There is no tenderness.  Musculoskeletal: She exhibits no edema.  Neurological: She is alert and oriented to person, place,  and time.  Skin: Skin is warm and dry.  Psychiatric: She has a normal mood and affect.  Nursing note and vitals reviewed.   BP 126/68 mmHg  Pulse 68  Temp(Src) 98.1 F (36.7 C) (Oral)  Ht $R'5\' 6"'wz$  (1.676 m)  Wt 157 lb 3.2 oz (71.305 kg)  BMI 25.38 kg/m2  SpO2 98% Wt Readings from Last 3 Encounters:  12/27/14 157 lb 3.2 oz (71.305 kg)  09/25/14 160 lb 4 oz (72.689 kg)  06/26/14 153 lb (69.4 kg)     Lab Results  Component Value Date   WBC 8.6 12/31/2014   HGB 12.7 12/31/2014   HCT 38.5 12/31/2014   PLT 233.0 12/31/2014   GLUCOSE 140* 12/31/2014   CHOL 208* 12/31/2014   TRIG 167.0* 12/31/2014   HDL 45.60 12/31/2014   LDLDIRECT 162.0 09/25/2014   LDLCALC 129* 12/31/2014   ALT 11 12/31/2014   AST 14 12/31/2014   NA 142 12/31/2014   K 3.9 12/31/2014   CL 106 12/31/2014   CREATININE 1.70* 12/31/2014   BUN 25* 12/31/2014   CO2 29 12/31/2014   TSH 2.86 12/31/2014   INR 1.27 12/28/2013   HGBA1C 7.2* 12/31/2014   MICROALBUR 89.6* 12/31/2014    Lab Results  Component Value Date   TSH 2.86 12/31/2014   Lab Results  Component Value Date   WBC 8.6 12/31/2014   HGB 12.7 12/31/2014   HCT 38.5 12/31/2014   MCV 89.7 12/31/2014   PLT 233.0 12/31/2014   Lab Results  Component Value Date   NA 142 12/31/2014   K 3.9 12/31/2014   CO2 29 12/31/2014   GLUCOSE 140* 12/31/2014   BUN 25* 12/31/2014   CREATININE 1.70* 12/31/2014   BILITOT 1.0 12/31/2014   ALKPHOS 84 12/31/2014   AST 14 12/31/2014   ALT 11 12/31/2014   PROT 6.7 12/31/2014   ALBUMIN 3.4* 12/31/2014   CALCIUM 9.4 12/31/2014   ANIONGAP 12 12/29/2013   GFR  30.60* 12/31/2014   Lab Results  Component Value Date   CHOL 208* 12/31/2014   Lab Results  Component Value Date   HDL 45.60 12/31/2014   Lab Results  Component Value Date   LDLCALC 129* 12/31/2014   Lab Results  Component Value Date   TRIG 167.0* 12/31/2014   Lab Results  Component Value Date   CHOLHDL 5 12/31/2014   Lab Results    Component Value Date   HGBA1C 7.2* 12/31/2014       Assessment & Plan:   Problem List Items Addressed This Visit    Hyperlipidemia   Relevant Medications   atorvastatin (LIPITOR) 20 MG tablet   Other Relevant Orders   Hemoglobin A1c (Completed)   CBC (Completed)   Comprehensive metabolic panel (Completed)   Lipid panel (Completed)   TSH (Completed)   Microalbumin / creatinine urine ratio (Completed)   Essential hypertension - Primary    Well controlled, no changes to meds. Encouraged heart healthy diet such as the DASH diet and exercise as tolerated.       Relevant Medications   atorvastatin (LIPITOR) 20 MG tablet   Other Relevant Orders   Hemoglobin A1c (Completed)   CBC (Completed)   Comprehensive metabolic panel (Completed)   Lipid panel (Completed)   TSH (Completed)   Microalbumin / creatinine urine ratio (Completed)   Diabetes mellitus type 2, insulin dependent (HCC) (Chronic)    hgba1c acceptable, minimize simple carbs. Increase exercise as tolerated. Continue current meds      Relevant Medications   atorvastatin (LIPITOR) 20 MG tablet   Other Relevant Orders   Hemoglobin A1c (Completed)   CBC (Completed)   Comprehensive metabolic panel (Completed)   Lipid panel (Completed)   TSH (Completed)   Microalbumin / creatinine urine ratio (Completed)   Depression    Patient's daughter that she lived with has just passed away somewhat unexpectedly. She is acoompanied by her son. She is tearful at times but is managing fairly well. Spent 35 of 40 minutes of visit in counseling. Continue celexa for now which they think is helping and consider further med changes if worsens      B12 deficiency   Relevant Orders   Hemoglobin A1c (Completed)   CBC (Completed)   Comprehensive metabolic panel (Completed)   Lipid panel (Completed)   TSH (Completed)   Microalbumin / creatinine urine ratio (Completed)   Anemia   Relevant Orders   Hemoglobin A1c (Completed)   CBC  (Completed)   Comprehensive metabolic panel (Completed)   Lipid panel (Completed)   TSH (Completed)   Microalbumin / creatinine urine ratio (Completed)      I am having Ms. Stonerock maintain her Iron, Vitamin D, Fish Oil, nitroGLYCERIN, Alcohol Prep, insulin lispro, isosorbide mononitrate, amLODipine, donepezil, furosemide, Insulin Glargine, clopidogrel, Insulin Pen Needle, CVS BLOOD GLUCOSE METER, glucose blood, ASSURE COMFORT LANCETS 30G, citalopram, HEMATINIC/FOLIC ACID, isosorbide mononitrate, atorvastatin, and ranitidine.  Meds ordered this encounter  Medications  . atorvastatin (LIPITOR) 20 MG tablet    Sig: Take 20 mg by mouth daily.  . ranitidine (ZANTAC) 150 MG tablet    Sig: Take 150 mg by mouth at bedtime.     Penni Homans, MD

## 2015-01-06 NOTE — Assessment & Plan Note (Addendum)
Patient's daughter that she lived with has just passed away somewhat unexpectedly. She is acoompanied by her son. She is tearful at times but is managing fairly well. Spent 35 of 40 minutes of visit in counseling. Continue celexa for now which they think is helping and consider further med changes if worsens

## 2015-01-06 NOTE — Assessment & Plan Note (Signed)
hgba1c acceptable, minimize simple carbs. Increase exercise as tolerated. Continue current meds 

## 2015-01-06 NOTE — Assessment & Plan Note (Signed)
Well controlled, no changes to meds. Encouraged heart healthy diet such as the DASH diet and exercise as tolerated.  °

## 2015-01-07 ENCOUNTER — Other Ambulatory Visit: Payer: Self-pay | Admitting: Family Medicine

## 2015-01-07 MED ORDER — ISOSORBIDE MONONITRATE ER 60 MG PO TB24: 60.0000 mg | ORAL_TABLET | Freq: Every day | ORAL | Status: DC

## 2015-01-12 ENCOUNTER — Other Ambulatory Visit: Payer: Self-pay | Admitting: Family Medicine

## 2015-03-19 LAB — HM DIABETES EYE EXAM

## 2015-03-22 ENCOUNTER — Encounter: Payer: Self-pay | Admitting: Family Medicine

## 2015-03-30 ENCOUNTER — Encounter: Payer: Self-pay | Admitting: Family Medicine

## 2015-04-01 ENCOUNTER — Other Ambulatory Visit: Payer: Self-pay | Admitting: Family Medicine

## 2015-04-01 MED ORDER — FUROSEMIDE 20 MG PO TABS: ORAL_TABLET | ORAL | Status: DC

## 2015-04-01 MED ORDER — DONEPEZIL HCL 10 MG PO TABS: 10.0000 mg | ORAL_TABLET | Freq: Every day | ORAL | Status: DC

## 2015-04-01 MED ORDER — FERROUS FUMARATE-FOLIC ACID 324-1 MG PO TABS: 1.0000 | ORAL_TABLET | Freq: Every day | ORAL | Status: DC

## 2015-04-14 ENCOUNTER — Emergency Department (HOSPITAL_BASED_OUTPATIENT_CLINIC_OR_DEPARTMENT_OTHER)
Admission: EM | Admit: 2015-04-14 | Discharge: 2015-04-14 | Disposition: A | Discharge status: Home / Self Care | Payer: Medicare Other | Attending: Emergency Medicine | Admitting: Emergency Medicine

## 2015-04-14 ENCOUNTER — Emergency Department (HOSPITAL_BASED_OUTPATIENT_CLINIC_OR_DEPARTMENT_OTHER): Payer: Medicare Other

## 2015-04-14 ENCOUNTER — Encounter: Payer: Self-pay | Admitting: Family Medicine

## 2015-04-14 ENCOUNTER — Encounter (HOSPITAL_BASED_OUTPATIENT_CLINIC_OR_DEPARTMENT_OTHER): Payer: Self-pay | Admitting: Emergency Medicine

## 2015-04-14 DIAGNOSIS — W19XXXA Unspecified fall, initial encounter: Secondary | ICD-10-CM

## 2015-04-14 DIAGNOSIS — Z951 Presence of aortocoronary bypass graft: Secondary | ICD-10-CM | POA: Diagnosis not present

## 2015-04-14 DIAGNOSIS — Z79899 Other long term (current) drug therapy: Secondary | ICD-10-CM | POA: Diagnosis not present

## 2015-04-14 DIAGNOSIS — N184 Chronic kidney disease, stage 4 (severe): Secondary | ICD-10-CM | POA: Insufficient documentation

## 2015-04-14 DIAGNOSIS — Z8711 Personal history of peptic ulcer disease: Secondary | ICD-10-CM | POA: Insufficient documentation

## 2015-04-14 DIAGNOSIS — Y9289 Other specified places as the place of occurrence of the external cause: Secondary | ICD-10-CM | POA: Diagnosis not present

## 2015-04-14 DIAGNOSIS — I2510 Atherosclerotic heart disease of native coronary artery without angina pectoris: Secondary | ICD-10-CM | POA: Insufficient documentation

## 2015-04-14 DIAGNOSIS — W010XXA Fall on same level from slipping, tripping and stumbling without subsequent striking against object, initial encounter: Secondary | ICD-10-CM | POA: Diagnosis not present

## 2015-04-14 DIAGNOSIS — Z7902 Long term (current) use of antithrombotics/antiplatelets: Secondary | ICD-10-CM | POA: Diagnosis not present

## 2015-04-14 DIAGNOSIS — E11319 Type 2 diabetes mellitus with unspecified diabetic retinopathy without macular edema: Secondary | ICD-10-CM | POA: Insufficient documentation

## 2015-04-14 DIAGNOSIS — R634 Abnormal weight loss: Secondary | ICD-10-CM | POA: Insufficient documentation

## 2015-04-14 DIAGNOSIS — I129 Hypertensive chronic kidney disease with stage 1 through stage 4 chronic kidney disease, or unspecified chronic kidney disease: Secondary | ICD-10-CM | POA: Diagnosis not present

## 2015-04-14 DIAGNOSIS — F0390 Unspecified dementia without behavioral disturbance: Secondary | ICD-10-CM | POA: Diagnosis not present

## 2015-04-14 DIAGNOSIS — D500 Iron deficiency anemia secondary to blood loss (chronic): Secondary | ICD-10-CM | POA: Diagnosis not present

## 2015-04-14 DIAGNOSIS — M1990 Unspecified osteoarthritis, unspecified site: Secondary | ICD-10-CM | POA: Insufficient documentation

## 2015-04-14 DIAGNOSIS — Z8719 Personal history of other diseases of the digestive system: Secondary | ICD-10-CM | POA: Insufficient documentation

## 2015-04-14 DIAGNOSIS — Z87891 Personal history of nicotine dependence: Secondary | ICD-10-CM | POA: Insufficient documentation

## 2015-04-14 DIAGNOSIS — Y998 Other external cause status: Secondary | ICD-10-CM | POA: Diagnosis not present

## 2015-04-14 DIAGNOSIS — Z794 Long term (current) use of insulin: Secondary | ICD-10-CM | POA: Diagnosis not present

## 2015-04-14 DIAGNOSIS — Z87440 Personal history of urinary (tract) infections: Secondary | ICD-10-CM | POA: Diagnosis not present

## 2015-04-14 DIAGNOSIS — E785 Hyperlipidemia, unspecified: Secondary | ICD-10-CM | POA: Insufficient documentation

## 2015-04-14 DIAGNOSIS — Y9389 Activity, other specified: Secondary | ICD-10-CM | POA: Insufficient documentation

## 2015-04-14 DIAGNOSIS — Z8673 Personal history of transient ischemic attack (TIA), and cerebral infarction without residual deficits: Secondary | ICD-10-CM | POA: Diagnosis not present

## 2015-04-14 DIAGNOSIS — S29001A Unspecified injury of muscle and tendon of front wall of thorax, initial encounter: Secondary | ICD-10-CM | POA: Diagnosis present

## 2015-04-14 DIAGNOSIS — S2231XA Fracture of one rib, right side, initial encounter for closed fracture: Secondary | ICD-10-CM | POA: Diagnosis not present

## 2015-04-14 MED ORDER — TRAMADOL-ACETAMINOPHEN 37.5-325 MG PO TABS: 1.0000 | ORAL_TABLET | Freq: Four times a day (QID) | ORAL | Status: DC | PRN

## 2015-04-14 NOTE — ED Notes (Signed)
[place], [place], states to hold orders for labs at this time.

## 2015-04-14 NOTE — ED Notes (Signed)
Pt fell on Tuesday, slipped on wood floor, landing on floor.  Pt cut lip in fall (now healed) but denies any other head injury or pain.  Pt c/o pain bilateral upper abdominal pain along lower rib line since Tuesday, pain a bit better on Thursday, and this morning, pain is worse.

## 2015-04-14 NOTE — Discharge Instructions (Signed)
You were evaluated in the ED today after fall. Your x-ray showed a small, nondisplaced rib fracture on your lower right ribs. Her CT scan showed no acute findings and the fracture seemed less apparent. It is important for you to continue taking deep respirations and coughing when necessary. Follow-up with your doctor in the next 3-5 days for reevaluation. Return to ED for any new or worsening symptoms. Take your pain medicine as prescribed to help with your discomfort.

## 2015-04-14 NOTE — ED Provider Notes (Signed)
CSN: [id number]     Arrival date & time 04/14/15  1042 History   First MD Initiated Contact with Patient 04/14/15 1227     Chief Complaint  Patient presents with  . Fall     (Consider location/radiation/quality/duration/timing/severity/associated sxs/prior Treatment) HPI Destiny [name] is a 82 y.o. female with history of CAD among other multiple medical problems, on Plavix therapy, comes in for evaluation after a fall. Patient is brought in by son and daughter at bedside. They report patient lives alone and on Tuesday, slipped on her floor, falling forward and landing on her chest. She reports feeling well until Thursday when her discomfort and her right ribs started to gradually worsen. She denies any head trauma or loss of consciousness and denies any other injury during the fall. No cough, shortness of breath, other chest pain, abdominal pain, nausea or vomiting, back pain, headache or vision changes. States she overall feels well. Has been taking Tylenol for the pain with some relief. Palpation seems to worsen her discomfort. No other modifying factors. She is at baseline per family at the bedside.  Past Medical History  Diagnosis Date  . PVD (peripheral vascular disease) ([place])   . CAD (coronary artery disease)     a. s/p CABG x 4 (LIMA->LAD, VG->Diag, VG->OM1->OM3);  b. 2010 Cath: 3/4 patent grafts (VG->diag occluded), 3vd, sev PVD ->Med Rx;  c. 12/2011 Lexi MV: sm area of mild mid and apical ant wall ischemia ->med rx.  . Hypertension   . Hyperlipidemia   . Duodenal ulcer 2009 and 11/2011    caused GI bleeding  . Iron deficiency anemia secondary to blood loss (chronic)   . Amaurosis fugax   . Carotid bruit   . Diabetes mellitus type II     insulin dependent.   . Dementia     pt denies  . Diabetic retinopathy   . Gallstones 2009    seen on CT scan  . Diverticulosis   . Atherosclerosis   . Chronic kidney disease (CKD) stage G4/A1, severely decreased glomerular filtration rate  (GFR) between 15-29 mL/min/1.73 square meter and albuminuria creatinine ratio less than 30 mg/g (HCC)     GFR 25 on 12/25/13  . CVA (cerebral infarction)   . Diabetic peripheral neuropathy ([place])   . GI bleed 04/2011, 11/2011    found non-bleeding duodenal ulcers  . Arthritis 12/06/2012  . Hypersomnolence 03/12/2013  . Acute renal insufficiency 12/24/2013  . Depression 12/24/2013  . Melena 1015/2015    PEPTIC ULCER    REQUIRING TRANSFUSION  . Weight loss, non-intentional 01/29/2014  . UTI (urinary tract infection) 04/08/2014   Past Surgical History  Procedure Laterality Date  . Coronary artery bypass graft  1999    x 4  . Abdominal hysterectomy    . Upper gastrointestinal endoscopy  02/22/2008    w/biopsy, duedenal ulcer, antrum erosions  . Esophagogastroduodenoscopy  05/16/2011    Procedure: ESOPHAGOGASTRODUODENOSCOPY (EGD);  Surgeon: Gatha Mayer, MD;  Location: [facility] ENDOSCOPY;  Service: Endoscopy;  Laterality: N/A;  . Colonoscopy  05/20/2011    Procedure: COLONOSCOPY;  Surgeon: Scarlette Shorts, MD;  Location: [place];  Service: Endoscopy;  Laterality: N/A;  . Esophagogastroduodenoscopy  12/08/2011    Procedure: ESOPHAGOGASTRODUODENOSCOPY (EGD);  Surgeon: Ladene Artist, MD,FACG;  Location: [facility] ENDOSCOPY;  Service: Endoscopy;  Laterality: N/A;  . Enteroscopy N/A 12/29/2013    Procedure: ENTEROSCOPY;  Surgeon: Inda Castle, MD;  Location: [place];  Service: Endoscopy;  Laterality: N/A;   Family  History  Problem Relation Age of Onset  . Coronary artery disease Father   . Hypertension Father   . Stroke Father   . Cancer Mother     ?  . Diabetes Maternal Uncle   . Colon cancer Neg Hx    Social History  Substance Use Topics  . Smoking status: Former Smoker    Types: Cigarettes    Quit date: 12/31/2000  . Smokeless tobacco: Never Used  . Alcohol Use: No   OB History    No data available     Review of Systems A 10 point review of systems was completed and was negative  except for pertinent positives and negatives as mentioned in the history of present illness     Allergies  Review of patient's allergies indicates no known allergies.  Home Medications   Prior to Admission medications   Medication Sig Start Date End Date Taking? Authorizing Provider  Alcohol Swabs (ALCOHOL PREP) 70 % PADS  10/30/13   Historical Provider, MD  amLODipine (NORVASC) 10 MG tablet Take 1 tablet (10 mg total) by mouth daily. 09/25/14   Mosie Lukes, MD  ASSURE COMFORT LANCETS 30G MISC Use once daily to check blood sugar.  DX E11.9 10/15/14   Mosie Lukes, MD  atorvastatin (LIPITOR) 20 MG tablet Take 20 mg by mouth daily.    Historical Provider, MD  Blood Glucose Monitoring Suppl ([employer] BLOOD GLUCOSE METER) W/DEVICE KIT 1 Device by Does not apply route once. 10/08/14   Mosie Lukes, MD  Cholecalciferol (VITAMIN D) 1000 UNITS capsule Take 1,000 Units by mouth every evening.     Historical Provider, MD  citalopram (CELEXA) 20 MG tablet Take 1 tablet (20 mg total) by mouth daily. 11/05/14   Mosie Lukes, MD  clopidogrel (PLAVIX) 75 MG tablet Take 75 mg by mouth daily.    Historical Provider, MD  clopidogrel (PLAVIX) 75 MG tablet TAKE 1 TABLET BY MOUTH DAILY. 01/12/15   Mosie Lukes, MD  donepezil (ARICEPT) 10 MG tablet Take 1 tablet (10 mg total) by mouth at bedtime. 04/01/15   Mosie Lukes, MD  Ferrous Fumarate-Folic Acid (HEMATINIC/FOLIC ACID) 527-7 MG TABS Take 1 tablet by mouth daily. 04/01/15   Mosie Lukes, MD  Ferrous Sulfate (IRON) 325 (65 FE) MG TABS Take 325 mg by mouth 2 (two) times daily.     Historical Provider, MD  furosemide (LASIX) 20 MG tablet TAKE 1 TABLET (20 MG TOTAL) BY MOUTH DAILY. 04/01/15   Mosie Lukes, MD  glucose blood test strip Please dispense test strips for [employer] Brand Advanced Glucose Meter.  Check BS once daily prn.  DX: E11.9 10/15/14   Mosie Lukes, MD  Insulin Glargine (LANTUS SOLOSTAR) 100 UNIT/ML Solostar Pen INJECT 20-22 UNITS INTO THE SKIN in  AM 09/25/14   Mosie Lukes, MD  insulin lispro (HUMALOG KWIKPEN) 100 UNIT/ML KiwkPen Inject 0.05-0.1 mLs (5-10 Units total) into the skin 3 (three) times daily with meals. 04/03/14   Mosie Lukes, MD  Insulin Pen Needle (B-D ULTRAFINE III SHORT PEN) 31G X 8 MM MISC Use as directed for insulin 10/08/14   Mosie Lukes, MD  isosorbide mononitrate (IMDUR) 60 MG 24 hr tablet Take 1 tablet (60 mg total) by mouth daily. 01/07/15   Mosie Lukes, MD  nitroGLYCERIN (NITROSTAT) 0.4 MG SL tablet Place 0.4 mg under the tongue every 5 (five) minutes as needed for chest pain.    Historical Provider, MD  Omega-3 Fatty Acids (FISH OIL) 1200 MG CAPS Take 1,200 mg by mouth daily.     Historical Provider, MD  pantoprazole (PROTONIX) 40 MG tablet Take 1 tablet every Monday, Wednesday and Friday. 12/31/14   Mosie Lukes, MD  ranitidine (ZANTAC) 150 MG tablet Take 150 mg by mouth at bedtime.    Historical Provider, MD  traMADol-acetaminophen (ULTRACET) 37.5-325 MG tablet Take 1 tablet by mouth every 6 (six) hours as needed. 04/14/15   Ennis Heavner, PA-C   BP 178/64 mmHg  Pulse 55  Temp(Src) 98 F (36.7 C) (Oral)  Resp 18  Ht _0  (1.676 m)  Wt 70.308 kg  BMI 25.03 kg/m2  SpO2 96% Physical Exam  Constitutional: She is oriented to person, place, and time. She appears well-developed and well-nourished.  HENT:  Head: Normocephalic and atraumatic.  Mouth/Throat: Oropharynx is clear and moist.  Eyes: Conjunctivae are normal. Pupils are equal, round, and reactive to light. Right eye exhibits no discharge. Left eye exhibits no discharge. No scleral icterus.  Neck: Normal range of motion. Neck supple.  Cardiovascular: Normal rate, regular rhythm and normal heart sounds.   Pulmonary/Chest: Effort normal and breath sounds normal. No respiratory distress. She has no wheezes. She has no rales.  Patient is able to take deep respirations with appropriate rise and fall of chest wall. She has tenderness diffusely  throughout lateral aspect of inferior ribs. No skin tenting, crepitus, ecchymosis, erythema or other abnormalities noted.  Abdominal: Soft. There is no tenderness.  Musculoskeletal: Normal range of motion. She exhibits no edema or tenderness.  Neurological: She is alert and oriented to person, place, and time.  Cranial Nerves II-XII grossly intact  Skin: Skin is warm and dry. No rash noted.  Psychiatric: She has a normal mood and affect.  Nursing note and vitals reviewed.   ED Course  Procedures (including critical care time) Labs Review Labs Reviewed  CBC WITH DIFFERENTIAL/PLATELET    Imaging Review Ct Abdomen Pelvis Wo Contrast  04/14/2015  CLINICAL DATA:  Right anterior rib pain after fall on Tuesday. Traumatic injury of the right upper quadrant. Renal insufficiency. EXAM: CT ABDOMEN AND PELVIS WITHOUT CONTRAST TECHNIQUE: Multidetector CT imaging of the abdomen and pelvis was performed following the standard protocol without IV contrast. COMPARISON:  01/25/2013 FINDINGS: Lower chest: The suspected right eleventh rib fractures not well seen by CT. Mild cardiomegaly noted with coronary atherosclerosis and calcification of the aortic valve. Hepatobiliary: No noncontrast CT findings of acute liver injury. No perihepatic ascites or other secondary findings. I do not directly visualize a liver laceration. Clearly sensitivity for smaller lacerations is reduced due to the lack of IV contrast. The patient has renal insufficiency. Layering gallstones are present in the gallbladder. No biliary dilatation observed. Pancreas: Unremarkable Spleen: Unremarkable Adrenals/Urinary Tract: Parapelvic cyst right kidney lower pole, 6.2 by 4.1 cm, internal density 8 Hounsfield units. Small on of gas in the urinary bladder, query recent catheterization. Vascular calcifications along the left renal hilum. No discrete renal calculi. No hydronephrosis or hydroureter. Stomach/Bowel: Sigmoid diverticulosis. No active  diverticulitis identified. Scattered diverticula in the transverse and descending colon. Appendix not well seen. Vascular/Lymphatic: Aortoiliac atherosclerotic vascular disease. No pathologic adenopathy identified. Reproductive: Uterus absent. Right ovary unremarkable, left ovary not well seen. Other: No supplemental non-categorized findings. Musculoskeletal: Small amount of heterotopic calcification in the right gluteus minimus tendon. Grade 1 degenerative anterolisthesis at L4-5, no pars defects, likely with associated central narrowing of the thecal sac at the L4-5 level. IMPRESSION: 1.  The nondisplaced fracture seen on radiography is less apparent on CT. No underlying solid organ injury is identified in the abdomen on today's noncontrast assessment. 2. Cholelithiasis. 3. Stable right parapelvic cyst. 4. Sigmoid diverticulosis. 5.  Aortoiliac atherosclerotic vascular disease. 6. There is central narrowing of the thecal sac at L4-5 due to degenerative subluxation. 7. Mild cardiomegaly with coronary atherosclerosis. Electronically Signed   By: Van Clines M.D.   On: 04/14/2015 15:47   Dg Chest 2 View  04/14/2015  CLINICAL DATA:  Right chest pain after fall on Tuesday. Pain under right breast anteriorly. EXAM: CHEST  2 VIEW COMPARISON:  01/19/2013 FINDINGS: Prior CABG. Mild cardiomegaly. Scarring or atelectasis in the lung bases. No effusions or acute bony abnormality. IMPRESSION: Mild cardiomegaly Bibasilar scarring or atelectasis. Electronically Signed   By: Rolm Baptise M.D.   On: 04/14/2015 11:41   Dg Ribs Unilateral W/chest Right  04/14/2015  CLINICAL DATA:  [place] at home 5 days ago. Injury to the right chest. Subsequent pain. EXAM: RIGHT RIBS AND CHEST - 3+ VIEW COMPARISON:  Chest radiography earlier same day FINDINGS: Previous CABG. Heart size at the upper limits of normal. Lungs clear without pneumothorax or hemothorax. Right rib films show a marker in the region of the right lateral eleventh  rib. There is nondisplaced fracture of that rib in that location. No other rib abnormality. IMPRESSION: Nondisplaced fracture of the right eleventh rib laterally. Electronically Signed   By: Nelson Chimes M.D.   On: 04/14/2015 13:34   I have personally reviewed and evaluated these images and lab results as part of my medical decision-making.   EKG Interpretation None      Meds given in ED:  Medications - No data to display  Discharge Medication List as of 04/14/2015  4:31 PM    START taking these medications   Details  traMADol-acetaminophen (ULTRACET) 37.5-325 MG tablet Take 1 tablet by mouth every 6 (six) hours as needed., Starting 04/14/2015, Until Discontinued, Print       Filed Vitals:   04/14/15 1046 04/14/15 1315 04/14/15 1316 04/14/15 1629  BP: 161/53 153/56 153/56 178/64  Pulse: 55  53 55  Temp: 98 F (36.7 C)     TempSrc: Oral     Resp: _0 Height: 5' 6" (1.676 m)     Weight: 70.308 kg     SpO2: 95%  97% 96%    MDM  MARIMAR SUBER is a 82 y.o. female on Plavix therapy comes in for evaluation after a mechanical fall that occurred on Tuesday. She is accompanied by family who are concerned about her right rib pain after the fall . Dedicated rib films show right 11th rib nondisplaced fracture. Patient does have some discomfort in her right upper quadrant on abdominal exam. We will obtain non-con CT abdomen to evaluate for possible liver laceration. CT shows no underlying solid organ injury. No other acute findings. Patient overall appears well. Patient will be discharged home with Ultracet, instructions for good pulmonary toilet. Ambulates in the ED without difficulty. Discussed and reviewed ED course and results with family at bedside and they also agree to watch patient and follow up with PCP next week for reevaluation/has regularly scheduled appointment on Tuesday. Prior to patient discharge, I discussed and reviewed this case with my attending, Dr. Winfred Leeds who  also saw and evaluated the patient and agrees with plan.  The patient appears reasonably screened and/or stabilized for discharge and I doubt any other medical  condition or other [facility] requiring further screening, evaluation, or treatment in the ED at this time prior to discharge.   Final diagnoses:  Gerri Spore, PA-C 04/14/15 [place], MD 04/15/15 [phone]

## 2015-04-14 NOTE — ED Notes (Signed)
Pt son and daughter-in-law at bedside.  Pt able to converse without difficulty and states minimal pain while at rest.  Per daughter-in-law, pt was groaning in pain this morning while getting dressed.  Pt denies any loss of appetite or nausea.

## 2015-04-14 NOTE — ED Provider Notes (Signed)
Patient complains of right-sided chest pain and after she fell while standing on a wood floor wearing socks 5 days ago. She denies any shortness of breath. No other associated symptoms on exam no distress Glasgow Coma Score 15 was clear auscultation chest is tender right side anterolateral aspect. No crepitance. Abdomen no contusion, normoactive bowel sounds, tender right upper quadrant all 4 extremities at contusion abrasion or tenderness neurovascular intact  Medical decision making nursing unable to establish peripheral IV access with ultrasound. I discussed case with Dr.Mensellfrom radiology, okay to order CT scan without contrast which will show gross blood around the liver if present  Orlie Dakin, MD 04/14/15 [phone]

## 2015-04-15 ENCOUNTER — Other Ambulatory Visit: Payer: Self-pay | Admitting: Family Medicine

## 2015-04-15 ENCOUNTER — Ambulatory Visit (INDEPENDENT_AMBULATORY_CARE_PROVIDER_SITE_OTHER): Payer: Medicare Other | Admitting: Family Medicine

## 2015-04-15 ENCOUNTER — Encounter: Payer: Self-pay | Admitting: Family Medicine

## 2015-04-15 VITALS — BP 186/82 | HR 46 | Temp 97.8°F | Ht 66.0 in | Wt 160.5 lb

## 2015-04-15 DIAGNOSIS — R5381 Other malaise: Secondary | ICD-10-CM

## 2015-04-15 DIAGNOSIS — E119 Type 2 diabetes mellitus without complications: Secondary | ICD-10-CM

## 2015-04-15 DIAGNOSIS — I10 Essential (primary) hypertension: Secondary | ICD-10-CM | POA: Diagnosis not present

## 2015-04-15 DIAGNOSIS — G4710 Hypersomnia, unspecified: Secondary | ICD-10-CM

## 2015-04-15 DIAGNOSIS — S2239XS Fracture of one rib, unspecified side, sequela: Secondary | ICD-10-CM

## 2015-04-15 DIAGNOSIS — E785 Hyperlipidemia, unspecified: Secondary | ICD-10-CM | POA: Diagnosis not present

## 2015-04-15 DIAGNOSIS — N390 Urinary tract infection, site not specified: Secondary | ICD-10-CM

## 2015-04-15 DIAGNOSIS — D500 Iron deficiency anemia secondary to blood loss (chronic): Secondary | ICD-10-CM

## 2015-04-15 DIAGNOSIS — E538 Deficiency of other specified B group vitamins: Secondary | ICD-10-CM

## 2015-04-15 DIAGNOSIS — N184 Chronic kidney disease, stage 4 (severe): Secondary | ICD-10-CM | POA: Diagnosis not present

## 2015-04-15 DIAGNOSIS — I25810 Atherosclerosis of coronary artery bypass graft(s) without angina pectoris: Secondary | ICD-10-CM

## 2015-04-15 DIAGNOSIS — W19XXXA Unspecified fall, initial encounter: Secondary | ICD-10-CM

## 2015-04-15 DIAGNOSIS — Z794 Long term (current) use of insulin: Secondary | ICD-10-CM

## 2015-04-15 DIAGNOSIS — F32A Depression, unspecified: Secondary | ICD-10-CM

## 2015-04-15 DIAGNOSIS — I5032 Chronic diastolic (congestive) heart failure: Secondary | ICD-10-CM

## 2015-04-15 DIAGNOSIS — R001 Bradycardia, unspecified: Secondary | ICD-10-CM

## 2015-04-15 DIAGNOSIS — F329 Major depressive disorder, single episode, unspecified: Secondary | ICD-10-CM

## 2015-04-15 LAB — MICROALBUMIN / CREATININE URINE RATIO
Creatinine,U: 129.4 mg/dL
MICROALB/CREAT RATIO: 72 mg/g — AB (ref 0.0–30.0)
Microalb, Ur: 93.2 mg/dL — ABNORMAL HIGH (ref 0.0–1.9)

## 2015-04-15 LAB — TSH: TSH: 2.45 u[IU]/mL (ref 0.35–4.50)

## 2015-04-15 LAB — COMPREHENSIVE METABOLIC PANEL
ALK PHOS: 96 U/L (ref 39–117)
ALT: 18 U/L (ref 0–35)
AST: 16 U/L (ref 0–37)
Albumin: 3.7 g/dL (ref 3.5–5.2)
BILIRUBIN TOTAL: 0.8 mg/dL (ref 0.2–1.2)
BUN: 28 mg/dL — ABNORMAL HIGH (ref 6–23)
CALCIUM: 9.6 mg/dL (ref 8.4–10.5)
CO2: 23 mEq/L (ref 19–32)
Chloride: 103 mEq/L (ref 96–112)
Creatinine, Ser: 1.56 mg/dL — ABNORMAL HIGH (ref 0.40–1.20)
GFR: 33.77 mL/min — AB (ref >60.00)
GLUCOSE: 204 mg/dL — AB (ref 70–99)
POTASSIUM: 3.7 meq/L (ref 3.5–5.1)
Sodium: 138 mEq/L (ref 135–145)
TOTAL PROTEIN: 7.7 g/dL (ref 6.0–8.3)

## 2015-04-15 LAB — URINALYSIS, ROUTINE W REFLEX MICROSCOPIC
BILIRUBIN URINE: NEGATIVE
KETONES UR: NEGATIVE
NITRITE: NEGATIVE
PH: 5.5 (ref 5.0–8.0)
SPECIFIC GRAVITY, URINE: 1.025 (ref 1.000–1.030)
URINE GLUCOSE: NEGATIVE
UROBILINOGEN UA: 0.2 (ref 0.0–1.0)

## 2015-04-15 LAB — CBC
HEMATOCRIT: 42.8 % (ref 36.0–46.0)
HEMOGLOBIN: 14.4 g/dL (ref 12.0–15.0)
MCHC: 33.5 g/dL (ref 30.0–36.0)
MCV: 90.7 fl (ref 78.0–100.0)
PLATELETS: 254 10*3/uL (ref 150.0–400.0)
RBC: 4.72 Mil/uL (ref 3.87–5.11)
RDW: 14.4 % (ref 11.5–15.5)
WBC: 9.2 10*3/uL (ref 4.0–10.5)

## 2015-04-15 LAB — HEMOGLOBIN A1C: Hgb A1c MFr Bld: 9.4 % — ABNORMAL HIGH (ref 4.6–6.5)

## 2015-04-15 LAB — LIPID PANEL
CHOLESTEROL: 240 mg/dL — AB (ref 0–200)
HDL: 54.3 mg/dL (ref >39.00)
NonHDL: 186.05
TRIGLYCERIDES: 246 mg/dL — AB (ref 0.0–149.0)
Total CHOL/HDL Ratio: 4
VLDL: 49.2 mg/dL — AB (ref 0.0–40.0)

## 2015-04-15 LAB — LDL CHOLESTEROL, DIRECT: Direct LDL: 142 mg/dL

## 2015-04-15 MED ORDER — CLOPIDOGREL BISULFATE 75 MG PO TABS: 75.0000 mg | ORAL_TABLET | Freq: Every day | ORAL | Status: DC

## 2015-04-15 NOTE — Progress Notes (Signed)
Destiny [name] [id number] 03/10/1932 04/15/2015      Patient Progress Note   Subjective  Chief Complaint  Chief Complaint  Patient presents with  . Follow-up    HPI  Patient presents for 3 month follow up. [place] and broke her rib 6 days ago. Slipped from having wool socks on a hardwood floor. Had pain but no difficulty breathing. Did not see anyone until two days ago when she went to the ED. Been taking tramadol. She is doing better since the passing of her daughter and is accompanied by her son. Blood sugars have been in the 250s and even 300s. Son states she is irregular with her medications,  Son notices that she has been more confused lately. Celexa is helping but she does not move from chair very often. Swelling has improved, but still occurs occasionally. Interested in physical therapy. Also interested in having someone come by to check on her and give her meds. Denies CP/palp/SOB/HA/congestion/fevers/GI or GU c/o. Taking meds as prescribed. Patient denies shortness of breath, chest pain,changes in urination, GI issues, recent fevers or illnesses    Past Medical History  Diagnosis Date  . PVD (peripheral vascular disease) ([place])   . CAD (coronary artery disease)     a. s/p CABG x 4 (LIMA->LAD, VG->Diag, VG->OM1->OM3);  b. 2010 Cath: 3/4 patent grafts (VG->diag occluded), 3vd, sev PVD ->Med Rx;  c. 12/2011 Lexi MV: sm area of mild mid and apical ant wall ischemia ->med rx.  . Hypertension   . Hyperlipidemia   . Duodenal ulcer 2009 and 11/2011    caused GI bleeding  . Iron deficiency anemia secondary to blood loss (chronic)   . Amaurosis fugax   . Carotid bruit   . Diabetes mellitus type II     insulin dependent.   . Dementia     pt denies  . Diabetic retinopathy   . Gallstones 2009    seen on CT scan  . Diverticulosis   . Atherosclerosis   . Chronic kidney disease (CKD) stage G4/A1, severely decreased glomerular filtration rate (GFR) between 15-29 mL/min/1.73 square meter  and albuminuria creatinine ratio less than 30 mg/g (HCC)     GFR 25 on 12/25/13  . CVA (cerebral infarction)   . Diabetic peripheral neuropathy ([place])   . GI bleed 04/2011, 11/2011    found non-bleeding duodenal ulcers  . Arthritis 12/06/2012  . Hypersomnolence 03/12/2013  . Acute renal insufficiency 12/24/2013  . Depression 12/24/2013  . Melena 1015/2015    PEPTIC ULCER    REQUIRING TRANSFUSION  . Weight loss, non-intentional 01/29/2014  . UTI (urinary tract infection) 04/08/2014    Past Surgical History  Procedure Laterality Date  . Coronary artery bypass graft  1999    x 4  . Abdominal hysterectomy    . Upper gastrointestinal endoscopy  02/22/2008    w/biopsy, duedenal ulcer, antrum erosions  . Esophagogastroduodenoscopy  05/16/2011    Procedure: ESOPHAGOGASTRODUODENOSCOPY (EGD);  Surgeon: Gatha Mayer, MD;  Location: [facility] ENDOSCOPY;  Service: Endoscopy;  Laterality: N/A;  . Colonoscopy  05/20/2011    Procedure: COLONOSCOPY;  Surgeon: Scarlette Shorts, MD;  Location: [place];  Service: Endoscopy;  Laterality: N/A;  . Esophagogastroduodenoscopy  12/08/2011    Procedure: ESOPHAGOGASTRODUODENOSCOPY (EGD);  Surgeon: Ladene Artist, MD,FACG;  Location: [facility] ENDOSCOPY;  Service: Endoscopy;  Laterality: N/A;  . Enteroscopy N/A 12/29/2013    Procedure: ENTEROSCOPY;  Surgeon: Inda Castle, MD;  Location: [place];  Service: Endoscopy;  Laterality: N/A;  Family History  Problem Relation Age of Onset  . Coronary artery disease Father   . Hypertension Father   . Stroke Father   . Cancer Mother     ?  . Diabetes Maternal Uncle   . Colon cancer Neg Hx     Social History   Social History  . Marital Status: Widowed    Spouse Name: N/A  . Number of Children: 4  . Years of Education: N/A   Occupational History  . retired    Social History Main Topics  . Smoking status: Former Smoker    Types: Cigarettes    Quit date: 12/31/2000  . Smokeless tobacco: Never Used  . Alcohol Use:  No  . Drug Use: No  . Sexual Activity: Not on file   Other Topics Concern  . Not on file   Social History Narrative   Widowed - husband passed away in 05/03/2009   lives with daughter with epilepsy - mental retardation   Retired     Former Smoker      Alcohol use-no          Occupation: Retired       son - Ula Couvillon     Current Outpatient Prescriptions on File Prior to Visit  Medication Sig Dispense Refill  . Alcohol Swabs (ALCOHOL PREP) 70 % PADS     . amLODipine (NORVASC) 10 MG tablet Take 1 tablet (10 mg total) by mouth daily. 90 tablet 1  . ASSURE COMFORT LANCETS 30G MISC Use once daily to check blood sugar.  DX E11.9 100 each 6  . atorvastatin (LIPITOR) 20 MG tablet Take 20 mg by mouth daily.    . Blood Glucose Monitoring Suppl ([employer] BLOOD GLUCOSE METER) W/DEVICE KIT 1 Device by Does not apply route once. 1 kit 0  . Cholecalciferol (VITAMIN D) 1000 UNITS capsule Take 1,000 Units by mouth every evening.     . citalopram (CELEXA) 20 MG tablet Take 1 tablet (20 mg total) by mouth daily. 90 tablet 1  . clopidogrel (PLAVIX) 75 MG tablet Take 75 mg by mouth daily.    Marland Kitchen donepezil (ARICEPT) 10 MG tablet Take 1 tablet (10 mg total) by mouth at bedtime. 90 tablet 1  . Ferrous Fumarate-Folic Acid (HEMATINIC/FOLIC ACID) 967-5 MG TABS Take 1 tablet by mouth daily. 30 each 3  . Ferrous Sulfate (IRON) 325 (65 FE) MG TABS Take 325 mg by mouth 2 (two) times daily.     . furosemide (LASIX) 20 MG tablet TAKE 1 TABLET (20 MG TOTAL) BY MOUTH DAILY. 90 tablet 1  . glucose blood test strip Please dispense test strips for [employer] Brand Advanced Glucose Meter.  Check BS once daily prn.  DX: E11.9 100 each 6  . Insulin Glargine (LANTUS SOLOSTAR) 100 UNIT/ML Solostar Pen INJECT 20-22 UNITS INTO THE SKIN in AM 15 pen 3  . insulin lispro (HUMALOG KWIKPEN) 100 UNIT/ML KiwkPen Inject 0.05-0.1 mLs (5-10 Units total) into the skin 3 (three) times daily with meals. 15 mL 4  . Insulin Pen Needle (B-D ULTRAFINE III  SHORT PEN) 31G X 8 MM MISC Use as directed for insulin 100 each 6  . isosorbide mononitrate (IMDUR) 60 MG 24 hr tablet Take 1 tablet (60 mg total) by mouth daily. 90 tablet 2  . nitroGLYCERIN (NITROSTAT) 0.4 MG SL tablet Place 0.4 mg under the tongue every 5 (five) minutes as needed for chest pain.    . Omega-3 Fatty Acids (FISH OIL) 1200 MG  CAPS Take 1,200 mg by mouth daily.     . pantoprazole (PROTONIX) 40 MG tablet Take 1 tablet every Monday, Wednesday and Friday. 30 tablet 5  . ranitidine (ZANTAC) 150 MG tablet Take 150 mg by mouth at bedtime.    . traMADol-acetaminophen (ULTRACET) 37.5-325 MG tablet Take 1 tablet by mouth every 6 (six) hours as needed. 30 tablet 0   No current facility-administered medications on file prior to visit.    No Known Allergies  Review of Systems   Constitutional: Negative for fever and malaise/fatigue.  HENT: Negative for congestion.  Eyes: Negative for discharge.  Respiratory: Negative for shortness of breath.  Cardiovascular: Negative for chest pain, palpitations. Positive leg swelling.  Gastrointestinal: Negative for nausea, abdominal pain and diarrhea.  Genitourinary: Negative for dysuria and urgency, hematuria and flank pain.  Musculoskeletal: Negative for myalgias. Positive for fall.  Skin: Negative for rash.  Neurological: Negative for loss of consciousness and headaches.  Endo/Heme/Allergies: Negative for polydipsia.  Psychiatric/Behavioral: Negative for depression and suicidal ideas. The patient is not nervous/anxious and does not have insomnia.   Objective  BP 212/60 mmHg  Pulse 46  Temp(Src) 97.8 F (36.6 C) (Oral)  Ht '5\' 6"'$  (1.676 m)  Wt 160 lb 8 oz (72.802 kg)  BMI 25.92 kg/m2  SpO2 98%  Physical Exam   Constitutional: Oriented to person, place, and time. Appears well-nourished. No distress.  Eyes: EOM are normal. Pupils are equal, round, and reactive to light.  Cardiovascular: Normal rate and irregular rhythm.grade  III/VI murmur   Pulmonary/Chest: Breath sounds normal.  Abdominal: Soft. Bowel sounds are normal.  Lymphadenopathy:   No cervical adenopathy.  Neurological: Alert and oriented to person, place, and time. Normal reflexes. No cranial nerve deficit.    Assessment & Plan  Anemia -Increase leafy greens, consider increased lean red meat and using cast iron cookware. -Continue to monitor, report any concerns.   Congestive Heart Failure -Patient asymptomatic at this time -Recommend seeing nutritionist to help with heart healthy diet -Continue current meds -Murmur heard, ordering echo with concern for AR  Depression with anxiety -Doing well with current meds  -Continue to monitor, no changes today  Diabetes Mellitus Type 2, controlled -HgA1c acceptable -Minimize simple carbs, increase exercise as tolerated -Continue current meds, monitor glucose levels strictly for next week   Essential hypertension -Poorly controlled -Encouraged heart healthy diet and exercise as tolerated.   Falls Risk -Falls prevention discussed, interested in physical therapy -Falls education provided -Home health consult  Hyperlipidemia -Tolerating statin -Encouraged heart healthy diet, avoid trans fats, minimize simple carbs and saturated fats.  -Increase exercise as tolerated  Obesity -Encouraged decrease po intake and increase exercise as tolerated.  -Avoid trans fats, eat small, frequent meals every 4-5 hours with lean proteins, complex carbs and healthy fats. Minimize simple carbs, GMO foods.  Lab Work Ordered -CBC, CMP, Lipid panel, TSH, A1c  Patient seen with and examined with student.  Agree with documentation See separate note for further documentation

## 2015-04-15 NOTE — Patient Instructions (Signed)
Hypertension Hypertension, commonly called high blood pressure, is when the force of blood pumping through your arteries is too strong. Your arteries are the blood vessels that carry blood from your heart throughout your body. A blood pressure reading consists of a higher number over a lower number, such as 110/72. The higher number (systolic) is the pressure inside your arteries when your heart pumps. The lower number (diastolic) is the pressure inside your arteries when your heart relaxes. Ideally you want your blood pressure below 120/80. Hypertension forces your heart to work harder to pump blood. Your arteries may become narrow or stiff. Having untreated or uncontrolled hypertension can cause heart attack, stroke, kidney disease, and other problems. RISK FACTORS Some risk factors for high blood pressure are controllable. Others are not.  Risk factors you cannot control include:   Race. You may be at higher risk if you are African American.  Age. Risk increases with age.  Gender. Men are at higher risk than women before age 45 years. After age 65, women are at higher risk than men. Risk factors you can control include:  Not getting enough exercise or physical activity.  Being overweight.  Getting too much fat, sugar, calories, or salt in your diet.  Drinking too much alcohol. SIGNS AND SYMPTOMS Hypertension does not usually cause signs or symptoms. Extremely high blood pressure (hypertensive crisis) may cause headache, anxiety, shortness of breath, and nosebleed. DIAGNOSIS To check if you have hypertension, your health care provider will measure your blood pressure while you are seated, with your arm held at the level of your heart. It should be measured at least twice using the same arm. Certain conditions can cause a difference in blood pressure between your right and left arms. A blood pressure reading that is higher than normal on one occasion does not mean that you need treatment. If  it is not clear whether you have high blood pressure, you may be asked to return on a different day to have your blood pressure checked again. Or, you may be asked to monitor your blood pressure at home for 1 or more weeks. TREATMENT Treating high blood pressure includes making lifestyle changes and possibly taking medicine. Living a healthy lifestyle can help lower high blood pressure. You may need to change some of your habits. Lifestyle changes may include:  Following the DASH diet. This diet is high in fruits, vegetables, and whole grains. It is low in salt, red meat, and added sugars.  Keep your sodium intake below 2,300 mg per day.  Getting at least 30-45 minutes of aerobic exercise at least 4 times per week.  Losing weight if necessary.  Not smoking.  Limiting alcoholic beverages.  Learning ways to reduce stress. Your health care provider may prescribe medicine if lifestyle changes are not enough to get your blood pressure under control, and if one of the following is true:  You are 18-59 years of age and your systolic blood pressure is above 140.  You are 60 years of age or older, and your systolic blood pressure is above 150.  Your diastolic blood pressure is above 90.  You have diabetes, and your systolic blood pressure is over 140 or your diastolic blood pressure is over 90.  You have kidney disease and your blood pressure is above 140/90.  You have heart disease and your blood pressure is above 140/90. Your personal target blood pressure may vary depending on your medical conditions, your age, and other factors. HOME CARE INSTRUCTIONS    Have your blood pressure rechecked as directed by your health care provider.   Take medicines only as directed by your health care provider. Follow the directions carefully. Blood pressure medicines must be taken as prescribed. The medicine does not work as well when you skip doses. Skipping doses also puts you at risk for  problems.  Do not smoke.   Monitor your blood pressure at home as directed by your health care provider. SEEK MEDICAL CARE IF:   You think you are having a reaction to medicines taken.  You have recurrent headaches or feel dizzy.  You have swelling in your ankles.  You have trouble with your vision. SEEK IMMEDIATE MEDICAL CARE IF:  You develop a severe headache or confusion.  You have unusual weakness, numbness, or feel faint.  You have severe chest or abdominal pain.  You vomit repeatedly.  You have trouble breathing. MAKE SURE YOU:   Understand these instructions.  Will watch your condition.  Will get help right away if you are not doing well or get worse.   This information is not intended to replace advice given to you by your health care provider. Make sure you discuss any questions you have with your health care provider.   Document Released: 03/02/2005 Document Revised: 07/17/2014 Document Reviewed: 12/23/2012 Elsevier Interactive Patient Education 2016 Elsevier Inc.  

## 2015-04-15 NOTE — Progress Notes (Signed)
Pre visit review using our clinic review tool, if applicable. No additional management support is needed unless otherwise documented below in the visit note. 

## 2015-04-16 ENCOUNTER — Other Ambulatory Visit: Payer: Self-pay | Admitting: Family Medicine

## 2015-04-16 DIAGNOSIS — I10 Essential (primary) hypertension: Secondary | ICD-10-CM

## 2015-04-16 MED ORDER — CEFDINIR 300 MG PO CAPS: 300.0000 mg | ORAL_CAPSULE | Freq: Two times a day (BID) | ORAL | Status: DC

## 2015-04-16 MED ORDER — TRIAMTERENE-HCTZ 37.5-25 MG PO TABS: 1.0000 | ORAL_TABLET | Freq: Every day | ORAL | Status: DC

## 2015-04-16 MED ORDER — AMLODIPINE BESYLATE 10 MG PO TABS: 10.0000 mg | ORAL_TABLET | Freq: Every day | ORAL | Status: DC

## 2015-04-17 ENCOUNTER — Telehealth: Payer: Self-pay | Admitting: Family Medicine

## 2015-04-17 NOTE — Telephone Encounter (Signed)
Caller name: Nira Conn Relationship to patient: [facility] Can be reached:[phone]  Reason for call: Would like a order for once a week for 4 weeks to do diabetes and heart education

## 2015-04-17 NOTE — Telephone Encounter (Signed)
Caller name: Physical Therapist from [place]  Relation to pt: Call back [place]   Reason for call:  Requesting orders for  twice a week for 3 weeks and once a week for 1 week for balance and walking training and home exercises

## 2015-04-18 ENCOUNTER — Other Ambulatory Visit: Payer: Self-pay | Admitting: Family Medicine

## 2015-04-18 LAB — URINE CULTURE: Colony Count: >=100000

## 2015-04-18 NOTE — Progress Notes (Signed)
Cardiology Office Note   Date:  04/19/2015   ID:  Destiny [name], DOB 03-14-1932, MRN [id number]  PCP:  Penni Homans, MD  Cardiologist:   Jenkins Rouge, MD   Chief Complaint  Patient presents with  . Establish Care      History of Present Illness: This is a 82 y.o. white female who has history of coronary artery disease status post CABG. Stress Myoview  12/2011  which showed a small area of mild mid and apical anterior wall ischemia ejection fraction 72%. She was treated medically. Her last cardiac catheterization was in 10/10 that showed 3 or 4 grafts patent and normal LV function.   The patient was admitted to the hospital on 06/04/12 with recurrent chest pain. Some of it was atypical but other somewhat typical. She ruled out for an MI. Diagnostic cardiac catheterization was discussed. She had mildly abnormal stress test in the fall and wished to pursue medical therapy. Her Imdur was increased to 60 mg daily. She also has baseline bradycardia and she was kept off her beta blocker.she also has mild renal insufficiency with creatinine of 1.2 and 1.4 in the hospital.   She has not had any chest pains. She is compliant with her meds. She has a daughter who is "slow" that lives with her [place] end of January with fractured left rib    Past Medical History  Diagnosis Date  . PVD (peripheral vascular disease) ([place])   . CAD (coronary artery disease)     a. s/p CABG x 4 (LIMA->LAD, VG->Diag, VG->OM1->OM3);  b. 2010 Cath: 3/4 patent grafts (VG->diag occluded), 3vd, sev PVD ->Med Rx;  c. 12/2011 Lexi MV: sm area of mild mid and apical ant wall ischemia ->med rx.  . Hypertension   . Hyperlipidemia   . Duodenal ulcer 2009 and 11/2011    caused GI bleeding  . Iron deficiency anemia secondary to blood loss (chronic)   . Amaurosis fugax   . Carotid bruit   . Diabetes mellitus type II     insulin dependent.   . Dementia     pt denies  . Diabetic retinopathy   . Gallstones 2009    seen on  CT scan  . Diverticulosis   . Atherosclerosis   . Chronic kidney disease (CKD) stage G4/A1, severely decreased glomerular filtration rate (GFR) between 15-29 mL/min/1.73 square meter and albuminuria creatinine ratio less than 30 mg/g (HCC)     GFR 25 on 12/25/13  . CVA (cerebral infarction)   . Diabetic peripheral neuropathy ([place])   . GI bleed 04/2011, 11/2011    found non-bleeding duodenal ulcers  . Arthritis 12/06/2012  . Hypersomnolence 03/12/2013  . Acute renal insufficiency 12/24/2013  . Depression 12/24/2013  . Melena 1015/2015    PEPTIC ULCER    REQUIRING TRANSFUSION  . Weight loss, non-intentional 01/29/2014  . UTI (urinary tract infection) 04/08/2014    Past Surgical History  Procedure Laterality Date  . Coronary artery bypass graft  1999    x 4  . Abdominal hysterectomy    . Upper gastrointestinal endoscopy  02/22/2008    w/biopsy, duedenal ulcer, antrum erosions  . Esophagogastroduodenoscopy  05/16/2011    Procedure: ESOPHAGOGASTRODUODENOSCOPY (EGD);  Surgeon: Gatha Mayer, MD;  Location: [facility] ENDOSCOPY;  Service: Endoscopy;  Laterality: N/A;  . Colonoscopy  05/20/2011    Procedure: COLONOSCOPY;  Surgeon: Scarlette Shorts, MD;  Location: [place];  Service: Endoscopy;  Laterality: N/A;  . Esophagogastroduodenoscopy  12/08/2011  Procedure: ESOPHAGOGASTRODUODENOSCOPY (EGD);  Surgeon: Ladene Artist, MD,FACG;  Location: [facility] ENDOSCOPY;  Service: Endoscopy;  Laterality: N/A;  . Enteroscopy N/A 12/29/2013    Procedure: ENTEROSCOPY;  Surgeon: Inda Castle, MD;  Location: [place];  Service: Endoscopy;  Laterality: N/A;     Current Outpatient Prescriptions  Medication Sig Dispense Refill  . amLODipine (NORVASC) 10 MG tablet Take 1 tablet (10 mg total) by mouth daily. 30 tablet 1  . atorvastatin (LIPITOR) 20 MG tablet Take 20 mg by mouth daily.    . cefdinir (OMNICEF) 300 MG capsule Take 300 mg by mouth 2 (two) times daily. Take for 5 days.    . Cholecalciferol (VITAMIN D)  1000 UNITS capsule Take 1,000 Units by mouth every evening.     . citalopram (CELEXA) 20 MG tablet Take 1 tablet (20 mg total) by mouth daily. 90 tablet 1  . clopidogrel (PLAVIX) 75 MG tablet Take 1 tablet (75 mg total) by mouth daily. 90 tablet 1  . donepezil (ARICEPT) 10 MG tablet Take 1 tablet (10 mg total) by mouth at bedtime. 90 tablet 1  . Ferrous Fumarate-Folic Acid (HEMATINIC/FOLIC ACID) 99991111 MG TABS Take 1 tablet by mouth daily. 30 each 3  . Ferrous Sulfate (IRON) 325 (65 FE) MG TABS Take 325 mg by mouth 2 (two) times daily.     . furosemide (LASIX) 20 MG tablet TAKE 1 TABLET (20 MG TOTAL) BY MOUTH DAILY. 90 tablet 1  . Insulin Glargine (LANTUS SOLOSTAR) 100 UNIT/ML Solostar Pen INJECT 20-22 UNITS INTO THE SKIN in AM 15 pen 3  . insulin lispro (HUMALOG KWIKPEN) 100 UNIT/ML KiwkPen Inject 0.05-0.1 mLs (5-10 Units total) into the skin 3 (three) times daily with meals. 15 mL 4  . Insulin Pen Needle (B-D ULTRAFINE III SHORT PEN) 31G X 8 MM MISC Use as directed for insulin 100 each 6  . isosorbide mononitrate (IMDUR) 60 MG 24 hr tablet Take 1 tablet (60 mg total) by mouth daily. 90 tablet 2  . nitroGLYCERIN (NITROSTAT) 0.4 MG SL tablet Place 0.4 mg under the tongue every 5 (five) minutes as needed for chest pain.    . Omega-3 Fatty Acids (FISH OIL) 1200 MG CAPS Take 1,200 mg by mouth daily.     . pantoprazole (PROTONIX) 40 MG tablet Take 1 tablet every Monday, Wednesday and Friday. 30 tablet 5  . ranitidine (ZANTAC) 150 MG tablet Take 150 mg by mouth at bedtime.    . traMADol-acetaminophen (ULTRACET) 37.5-325 MG tablet Take 1 tablet by mouth every 6 (six) hours as needed for moderate pain or severe pain.    Marland Kitchen triamterene-hydrochlorothiazide (MAXZIDE-25) 37.5-25 MG tablet Take 1 tablet by mouth daily. 30 tablet 1   No current facility-administered medications for this visit.    Allergies:   Review of patient's allergies indicates no known allergies.    Social History:  The patient   reports that she quit smoking about 14 years ago. Her smoking use included Cigarettes. She has never used smokeless tobacco. She reports that she does not drink alcohol or use illicit drugs.   Family History:  The patient's family history includes Cancer in her mother; Coronary artery disease in her father; Diabetes in her maternal uncle; Hypertension in her father; Stroke in her father. There is no history of Colon cancer.    ROS:  Please see the history of present illness.   Otherwise, review of systems are positive for none.   All other systems are reviewed and negative.  PHYSICAL EXAM: VS:  BP 142/60 mmHg  Pulse 55  Ht 5\' 6"  (1.676 m)  Wt 72.757 kg (160 lb 6.4 oz)  BMI 25.90 kg/m2  SpO2 96% , BMI Body mass index is 25.9 kg/(m^2). Affect appropriate Healthy:  appears stated age 89: normal Neck supple with no adenopathy JVP normal no bruits no thyromegaly Lungs clear with no wheezing and good diaphragmatic motion Heart:  S1/S2 no murmur, no rub, gallop or click PMI normal Abdomen: benighn, BS positve, no tenderness, no AAA no bruit.  No HSM or HJR Distal pulses intact with no bruits No edema Neuro non-focal Skin warm and dry No muscular weakness    EKG:   11/15/12  SR rate 56 [id number]    Recent Labs: 04/15/2015: ALT 18; BUN 28*; Creatinine, Ser 1.56*; Hemoglobin 14.4; Platelets 254.0; Potassium 3.7; Sodium 138; TSH 2.45    Lipid Panel    Component Value Date/Time   CHOL 240* 04/15/2015 1138   TRIG 246.0* 04/15/2015 1138   HDL 54.30 04/15/2015 1138   CHOLHDL 4 04/15/2015 1138   VLDL 49.2* 04/15/2015 1138   LDLCALC 129* 12/31/2014 1031   LDLDIRECT 142.0 04/15/2015 1138      Wt Readings from Last 3 Encounters:  04/19/15 72.757 kg (160 lb 6.4 oz)  04/15/15 72.802 kg (160 lb 8 oz)  04/14/15 70.308 kg (155 lb)      Other studies Reviewed: Additional studies/ records that were reviewed today include: Epic notes and cath records .    ASSESSMENT AND  PLAN:  1.  CAD/CABG: 99  No chest pain given age continue medical Rx 2. Chol:  Cholesterol is at goal.  Continue current dose of statin and diet Rx.  No myalgias or side effects.  F/U  LFT's in 6 months. Lab Results  Component Value Date   LDLCALC 129* 12/31/2014             3. DM: Discussed low carb diet.  Target hemoglobin A1c is 6.5 or less.  Continue current medications. 4. [place] [address] end of January with fractured rib. Pleuritic pain improved no pneumo     Current medicines are reviewed at length with the patient today.  The patient does not have concerns regarding medicines.  The following changes have been made:  no change  Labs/ tests ordered today include: None   No orders of the defined types were placed in this encounter.     Disposition:   FU with me in 6 months      Signed, Jenkins Rouge, MD  04/19/2015 11:46 AM    [facility] [place], [place], [place]  [address] Phone: [phone]; Fax: [phone]

## 2015-04-18 NOTE — Telephone Encounter (Signed)
HHRN informed 

## 2015-04-18 NOTE — Telephone Encounter (Signed)
OK to give verbal for requested 4 week course of care

## 2015-04-19 ENCOUNTER — Ambulatory Visit (INDEPENDENT_AMBULATORY_CARE_PROVIDER_SITE_OTHER): Payer: Medicare Other | Admitting: Cardiovascular Disease

## 2015-04-19 ENCOUNTER — Encounter: Payer: Self-pay | Admitting: Cardiovascular Disease

## 2015-04-19 VITALS — BP 142/60 | HR 55 | Ht 66.0 in | Wt 160.4 lb

## 2015-04-19 DIAGNOSIS — Z7189 Other specified counseling: Secondary | ICD-10-CM | POA: Diagnosis not present

## 2015-04-19 DIAGNOSIS — Z7689 Persons encountering health services in other specified circumstances: Secondary | ICD-10-CM

## 2015-04-19 NOTE — Patient Instructions (Signed)

## 2015-04-20 LAB — VITAMIN B1: VITAMIN B1 (THIAMINE): 13 nmol/L (ref 8–30)

## 2015-04-21 ENCOUNTER — Encounter: Payer: Self-pay | Admitting: Family Medicine

## 2015-04-21 DIAGNOSIS — S2239XA Fracture of one rib, unspecified side, initial encounter for closed fracture: Secondary | ICD-10-CM

## 2015-04-21 HISTORY — DX: Fracture of one rib, unspecified side, initial encounter for closed fracture: S22.39XA

## 2015-04-21 NOTE — Progress Notes (Signed)
Patient ID: Destiny [name], female   DOB: 01-25-1932, 82 y.o.   MRN: [id number]   Subjective:    Patient ID: Destiny [name], female    DOB: June 13, 1931, 82 y.o.   MRN: [id number]  Chief Complaint  Patient presents with  . Follow-up    HPI Patient is in today for follow up. She had a recent fall and fell and fractured a rib on the right. [place] and broke her rib 6 days ago. Slipped from having wool socks on a hardwood floor, she went to the ED. Been taking tramadol. She is doing better since the passing of her daughter and is accompanied by her son. Blood sugars have been in the 250s and even 300s. Son states she is irregular with her medications,  Son notices that she has been more confused lately. Celexa is helping but she does not move from chair very often. Swelling has improved, but still occurs occasionally. Interested in physical therapy. Also interested in having someone come by to check on her and give her meds. Denies CP/palp/SOB/HA/congestion/fevers/GI or GU c/o. Taking meds as prescribed. Patient denies shortness of breath, chest pain,changes in urination, GI issues, recent fevers or illnesses Past Medical History  Diagnosis Date  . PVD (peripheral vascular disease) ([place])   . CAD (coronary artery disease)     a. s/p CABG x 4 (LIMA->LAD, VG->Diag, VG->OM1->OM3);  b. 2010 Cath: 3/4 patent grafts (VG->diag occluded), 3vd, sev PVD ->Med Rx;  c. 12/2011 Lexi MV: sm area of mild mid and apical ant wall ischemia ->med rx.  . Hypertension   . Hyperlipidemia   . Duodenal ulcer 2009 and 11/2011    caused GI bleeding  . Iron deficiency anemia secondary to blood loss (chronic)   . Amaurosis fugax   . Carotid bruit   . Diabetes mellitus type II     insulin dependent.   . Dementia     pt denies  . Diabetic retinopathy   . Gallstones 2009    seen on CT scan  . Diverticulosis   . Atherosclerosis   . Chronic kidney disease (CKD) stage G4/A1, severely decreased glomerular filtration rate (GFR)  between 15-29 mL/min/1.73 square meter and albuminuria creatinine ratio less than 30 mg/g (HCC)     GFR 25 on 12/25/13  . CVA (cerebral infarction)   . Diabetic peripheral neuropathy ([place])   . GI bleed 04/2011, 11/2011    found non-bleeding duodenal ulcers  . Arthritis 12/06/2012  . Hypersomnolence 03/12/2013  . Acute renal insufficiency 12/24/2013  . Depression 12/24/2013  . Melena 1015/2015    PEPTIC ULCER    REQUIRING TRANSFUSION  . Weight loss, non-intentional 01/29/2014  . UTI (urinary tract infection) 04/08/2014  . Rib fracture 04/21/2015    Past Surgical History  Procedure Laterality Date  . Coronary artery bypass graft  1999    x 4  . Abdominal hysterectomy    . Upper gastrointestinal endoscopy  02/22/2008    w/biopsy, duedenal ulcer, antrum erosions  . Esophagogastroduodenoscopy  05/16/2011    Procedure: ESOPHAGOGASTRODUODENOSCOPY (EGD);  Surgeon: Gatha Mayer, MD;  Location: [facility] ENDOSCOPY;  Service: Endoscopy;  Laterality: N/A;  . Colonoscopy  05/20/2011    Procedure: COLONOSCOPY;  Surgeon: Scarlette Shorts, MD;  Location: [facility];  Service: Endoscopy;  Laterality: N/A;  . Esophagogastroduodenoscopy  12/08/2011    Procedure: ESOPHAGOGASTRODUODENOSCOPY (EGD);  Surgeon: Ladene Artist, MD,FACG;  Location: [facility] ENDOSCOPY;  Service: Endoscopy;  Laterality: N/A;  . Enteroscopy N/A 12/29/2013  Procedure: ENTEROSCOPY;  Surgeon: Louis Meckel, MD;  Location: [facility] ENDOSCOPY;  Service: Endoscopy;  Laterality: N/A;    Family History  Problem Relation Age of Onset  . Coronary artery disease Father   . Hypertension Father   . Stroke Father   . Cancer Mother     ?  . Diabetes Maternal Uncle   . Colon cancer Neg Hx     Social History   Social History  . Marital Status: Widowed    Spouse Name: N/A  . Number of Children: 4  . Years of Education: N/A   Occupational History  . retired    Social History Main Topics  . Smoking status: Former Smoker    Types: Cigarettes    Quit  date: 12/31/2000  . Smokeless tobacco: Never Used  . Alcohol Use: No  . Drug Use: No  . Sexual Activity: Not on file   Other Topics Concern  . Not on file   Social History Narrative   Widowed - husband passed away in May 16, 2009  lives with daughter with epilepsy - mental retardation   Retired     Former Smoker      Alcohol use-no          Occupation: Retired       son - Brigida Scotti     Outpatient Prescriptions Prior to Visit  Medication Sig Dispense Refill  . atorvastatin (LIPITOR) 20 MG tablet Take 20 mg by mouth daily.    . Cholecalciferol (VITAMIN D) 1000 UNITS capsule Take 1,000 Units by mouth every evening.     . citalopram (CELEXA) 20 MG tablet Take 1 tablet (20 mg total) by mouth daily. 90 tablet 1  . clopidogrel (PLAVIX) 75 MG tablet Take 1 tablet (75 mg total) by mouth daily. 90 tablet 1  . donepezil (ARICEPT) 10 MG tablet Take 1 tablet (10 mg total) by mouth at bedtime. 90 tablet 1  . Ferrous Fumarate-Folic Acid (HEMATINIC/FOLIC ACID) 324-1 MG TABS Take 1 tablet by mouth daily. 30 each 3  . Ferrous Sulfate (IRON) 325 (65 FE) MG TABS Take 325 mg by mouth 2 (two) times daily.     . furosemide (LASIX) 20 MG tablet TAKE 1 TABLET (20 MG TOTAL) BY MOUTH DAILY. 90 tablet 1  . Insulin Glargine (LANTUS SOLOSTAR) 100 UNIT/ML Solostar Pen INJECT 20-22 UNITS INTO THE SKIN in AM 15 pen 3  . insulin lispro (HUMALOG KWIKPEN) 100 UNIT/ML KiwkPen Inject 0.05-0.1 mLs (5-10 Units total) into the skin 3 (three) times daily with meals. 15 mL 4  . Insulin Pen Needle (B-D ULTRAFINE III SHORT PEN) 31G X 8 MM MISC Use as directed for insulin 100 each 6  . isosorbide mononitrate (IMDUR) 60 MG 24 hr tablet Take 1 tablet (60 mg total) by mouth daily. 90 tablet 2  . nitroGLYCERIN (NITROSTAT) 0.4 MG SL tablet Place 0.4 mg under the tongue every 5 (five) minutes as needed for chest pain.    . Omega-3 Fatty Acids (FISH OIL) 1200 MG CAPS Take 1,200 mg by mouth daily.     . pantoprazole (PROTONIX) 40 MG  tablet Take 1 tablet every Monday, Wednesday and Friday. 30 tablet 5  . ranitidine (ZANTAC) 150 MG tablet Take 150 mg by mouth at bedtime.    . Alcohol Swabs (ALCOHOL PREP) 70 % PADS     . amLODipine (NORVASC) 10 MG tablet Take 1 tablet (10 mg total) by mouth daily. 90 tablet 1  . ASSURE COMFORT LANCETS  30G MISC Use once daily to check blood sugar.  DX E11.9 100 each 6  . Blood Glucose Monitoring Suppl ([employer] BLOOD GLUCOSE METER) W/DEVICE KIT 1 Device by Does not apply route once. 1 kit 0  . clopidogrel (PLAVIX) 75 MG tablet Take 75 mg by mouth daily.    . clopidogrel (PLAVIX) 75 MG tablet TAKE 1 TABLET BY MOUTH DAILY. 90 tablet 1  . glucose blood test strip Please dispense test strips for [employer] Brand Advanced Glucose Meter.  Check BS once daily prn.  DX: E11.9 100 each 6  . traMADol-acetaminophen (ULTRACET) 37.5-325 MG tablet Take 1 tablet by mouth every 6 (six) hours as needed. 30 tablet 0   No facility-administered medications prior to visit.    No Known Allergies  Review of Systems  Constitutional: Positive for malaise/fatigue. Negative for fever.  HENT: Negative for congestion.   Eyes: Negative for discharge.  Respiratory: Negative for shortness of breath.   Cardiovascular: Positive for chest pain. Negative for palpitations and leg swelling.  Gastrointestinal: Negative for nausea and abdominal pain.  Genitourinary: Negative for dysuria.  Musculoskeletal: Positive for falls.  Skin: Negative for rash.  Neurological: Positive for weakness. Negative for loss of consciousness and headaches.  Endo/Heme/Allergies: Negative for environmental allergies.  Psychiatric/Behavioral: Negative for depression. The patient is not nervous/anxious.        Objective:    Physical Exam  Constitutional: She is oriented to person, place, and time. She appears well-developed and well-nourished. No distress.  HENT:  Head: Normocephalic and atraumatic.  Nose: Nose normal.  Eyes: Right eye exhibits no  discharge. Left eye exhibits no discharge.  Neck: Normal range of motion. Neck supple.  Cardiovascular: Normal rate and regular rhythm.   Pulmonary/Chest: Effort normal and breath sounds normal.  Abdominal: Soft. Bowel sounds are normal. There is no tenderness.  Musculoskeletal: She exhibits no edema.  Neurological: She is alert and oriented to person, place, and time.  Skin: Skin is warm and dry.  Psychiatric: She has a normal mood and affect.  Nursing note and vitals reviewed.   BP 186/82 mmHg  Pulse 46  Temp(Src) 97.8 F (36.6 C) (Oral)  Ht '5\' 6"'$  (1.676 m)  Wt 160 lb 8 oz (72.802 kg)  BMI 25.92 kg/m2  SpO2 98% Wt Readings from Last 3 Encounters:  04/19/15 160 lb 6.4 oz (72.757 kg)  04/15/15 160 lb 8 oz (72.802 kg)  04/14/15 155 lb (70.308 kg)     Lab Results  Component Value Date   WBC 9.2 04/15/2015   HGB 14.4 04/15/2015   HCT 42.8 04/15/2015   PLT 254.0 04/15/2015   GLUCOSE 204* 04/15/2015   CHOL 240* 04/15/2015   TRIG 246.0* 04/15/2015   HDL 54.30 04/15/2015   LDLDIRECT 142.0 04/15/2015   LDLCALC 129* 12/31/2014   ALT 18 04/15/2015   AST 16 04/15/2015   NA 138 04/15/2015   K 3.7 04/15/2015   CL 103 04/15/2015   CREATININE 1.56* 04/15/2015   BUN 28* 04/15/2015   CO2 23 04/15/2015   TSH 2.45 04/15/2015   INR 1.27 12/28/2013   HGBA1C 9.4* 04/15/2015   MICROALBUR 93.2* 04/15/2015    Lab Results  Component Value Date   TSH 2.45 04/15/2015   Lab Results  Component Value Date   WBC 9.2 04/15/2015   HGB 14.4 04/15/2015   HCT 42.8 04/15/2015   MCV 90.7 04/15/2015   PLT 254.0 04/15/2015   Lab Results  Component Value Date   NA 138 04/15/2015  K 3.7 04/15/2015   CO2 23 04/15/2015   GLUCOSE 204* 04/15/2015   BUN 28* 04/15/2015   CREATININE 1.56* 04/15/2015   BILITOT 0.8 04/15/2015   ALKPHOS 96 04/15/2015   AST 16 04/15/2015   ALT 18 04/15/2015   PROT 7.7 04/15/2015   ALBUMIN 3.7 04/15/2015   CALCIUM 9.6 04/15/2015   ANIONGAP 12 12/29/2013    GFR 33.77* 04/15/2015   Lab Results  Component Value Date   CHOL 240* 04/15/2015   Lab Results  Component Value Date   HDL 54.30 04/15/2015   Lab Results  Component Value Date   LDLCALC 129* 12/31/2014   Lab Results  Component Value Date   TRIG 246.0* 04/15/2015   Lab Results  Component Value Date   CHOLHDL 4 04/15/2015   Lab Results  Component Value Date   HGBA1C 9.4* 04/15/2015       Assessment & Plan:   Problem List Items Addressed This Visit    Anemia   Relevant Orders   CBC (Completed)   TSH (Completed)   Comprehensive metabolic panel (Completed)   Lipid panel (Completed)   Hemoglobin A1c (Completed)   Microalbumin / creatinine urine ratio (Completed)   Echocardiogram   Ambulatory referral to [place]   Ambulatory referral to Cardiology   B12 deficiency   Relevant Orders   CBC (Completed)   TSH (Completed)   Comprehensive metabolic panel (Completed)   Lipid panel (Completed)   Hemoglobin A1c (Completed)   Microalbumin / creatinine urine ratio (Completed)   Echocardiogram   Ambulatory referral to [place]   Ambulatory referral to Cardiology   Vitamin B1 (Completed)   Bradycardia   Relevant Orders   CBC (Completed)   TSH (Completed)   Comprehensive metabolic panel (Completed)   Lipid panel (Completed)   Hemoglobin A1c (Completed)   Microalbumin / creatinine urine ratio (Completed)   Echocardiogram   Ambulatory referral to Home Health   Ambulatory referral to Cardiology   CAD (coronary artery disease) of artery bypass graft   Relevant Orders   CBC (Completed)   TSH (Completed)   Comprehensive metabolic panel (Completed)   Lipid panel (Completed)   Hemoglobin A1c (Completed)   Microalbumin / creatinine urine ratio (Completed)   Echocardiogram   Ambulatory referral to Home Health   Ambulatory referral to Cardiology   Chronic diastolic heart failure (HCC) (Chronic)   Relevant Orders   CBC (Completed)   TSH (Completed)    Comprehensive metabolic panel (Completed)   Lipid panel (Completed)   Hemoglobin A1c (Completed)   Microalbumin / creatinine urine ratio (Completed)   Echocardiogram   Ambulatory referral to Home Health   Ambulatory referral to Cardiology   CKD (chronic kidney disease) stage 4, GFR 15-29 ml/min (HCC) (Chronic)   Relevant Orders   CBC (Completed)   TSH (Completed)   Comprehensive metabolic panel (Completed)   Lipid panel (Completed)   Hemoglobin A1c (Completed)   Microalbumin / creatinine urine ratio (Completed)   Echocardiogram   Ambulatory referral to [place]   Ambulatory referral to Cardiology   Depression    Continue Citalopram.       Diabetes mellitus type 2, insulin dependent (HCC) - Primary (Chronic)    hgba1c unacceptable, minimize simple carbs. Increase exercise as tolerated. Continue current meds but increase insulin dosing as instructed and minimize simple carbs. They note her sugars have been improving as she feels better      Relevant Orders   CBC (Completed)   TSH (Completed)  Comprehensive metabolic panel (Completed)   Lipid panel (Completed)   Hemoglobin A1c (Completed)   Microalbumin / creatinine urine ratio (Completed)   Echocardiogram   Ambulatory referral to Home Health   Ambulatory referral to Cardiology   Essential hypertension    Improved on recheck, minimize sodium and continue current meds.      Relevant Orders   CBC (Completed)   TSH (Completed)   Comprehensive metabolic panel (Completed)   Lipid panel (Completed)   Hemoglobin A1c (Completed)   Microalbumin / creatinine urine ratio (Completed)   Echocardiogram   Ambulatory referral to Home Health   Ambulatory referral to Cardiology   Hyperlipidemia   Relevant Orders   CBC (Completed)   TSH (Completed)   Comprehensive metabolic panel (Completed)   Lipid panel (Completed)   Hemoglobin A1c (Completed)   Microalbumin / creatinine urine ratio (Completed)   Echocardiogram   Ambulatory  referral to Home Health   Ambulatory referral to Cardiology   Hypersomnolence   Relevant Orders   CBC (Completed)   TSH (Completed)   Comprehensive metabolic panel (Completed)   Lipid panel (Completed)   Hemoglobin A1c (Completed)   Microalbumin / creatinine urine ratio (Completed)   Echocardiogram   Ambulatory referral to Home Health   Ambulatory referral to Cardiology   Rib fracture    Right 11 th rib pain is managing the pain. Reminded to continue to take deep breaths routinely and add topical treatements.       UTI (urinary tract infection)    Klebsiella UTI, treated with antibiotics and they will report if she does not improve.      Relevant Orders   Urinalysis   Urine culture (Completed)    Other Visit Diagnoses    Debility        Relevant Orders    CBC (Completed)    TSH (Completed)    Comprehensive metabolic panel (Completed)    Lipid panel (Completed)    Hemoglobin A1c (Completed)    Microalbumin / creatinine urine ratio (Completed)    Echocardiogram    Ambulatory referral to Home Health    Ambulatory referral to Cardiology    Fall, initial encounter        Relevant Orders    CBC (Completed)    TSH (Completed)    Comprehensive metabolic panel (Completed)    Lipid panel (Completed)    Hemoglobin A1c (Completed)    Microalbumin / creatinine urine ratio (Completed)    Echocardiogram    Ambulatory referral to [place]    Ambulatory referral to Cardiology       I am having Ms. Nanda maintain her Iron, Vitamin D, Fish Oil, nitroGLYCERIN, insulin lispro, Insulin Glargine, Insulin Pen Needle, citalopram, atorvastatin, ranitidine, pantoprazole, isosorbide mononitrate, donepezil, furosemide, Ferrous Fumarate-Folic Acid, and clopidogrel.  No orders of the defined types were placed in this encounter.     Penni Homans, MD

## 2015-04-21 NOTE — Assessment & Plan Note (Signed)
Improved on recheck, minimize sodium and continue current meds.

## 2015-04-21 NOTE — Assessment & Plan Note (Signed)
hgba1c unacceptable, minimize simple carbs. Increase exercise as tolerated. Continue current meds but increase insulin dosing as instructed and minimize simple carbs. They note her sugars have been improving as she feels better

## 2015-04-21 NOTE — Assessment & Plan Note (Signed)
Continue Citalopram.

## 2015-04-21 NOTE — Assessment & Plan Note (Signed)
Klebsiella UTI, treated with antibiotics and they will report if she does not improve.

## 2015-04-21 NOTE — Assessment & Plan Note (Signed)
Right 11 th rib pain is managing the pain. Reminded to continue to take deep breaths routinely and add topical treatements.

## 2015-04-22 ENCOUNTER — Other Ambulatory Visit: Payer: Self-pay | Admitting: Family Medicine

## 2015-04-22 MED ORDER — THIAMINE HCL 100 MG PO TABS: 100.0000 mg | ORAL_TABLET | Freq: Every day | ORAL | Status: DC

## 2015-04-23 ENCOUNTER — Other Ambulatory Visit (INDEPENDENT_AMBULATORY_CARE_PROVIDER_SITE_OTHER): Payer: Medicare Other

## 2015-04-23 ENCOUNTER — Ambulatory Visit (INDEPENDENT_AMBULATORY_CARE_PROVIDER_SITE_OTHER): Payer: Medicare Other | Admitting: Family Medicine

## 2015-04-23 VITALS — BP 132/71 | HR 55

## 2015-04-23 DIAGNOSIS — I10 Essential (primary) hypertension: Secondary | ICD-10-CM

## 2015-04-23 LAB — COMPREHENSIVE METABOLIC PANEL
ALBUMIN: 3.4 g/dL — AB (ref 3.5–5.2)
ALK PHOS: 127 U/L — AB (ref 39–117)
ALT: 10 U/L (ref 0–35)
AST: 13 U/L (ref 0–37)
BUN: 38 mg/dL — AB (ref 6–23)
CO2: 27 mEq/L (ref 19–32)
Calcium: 9.3 mg/dL (ref 8.4–10.5)
Chloride: 105 mEq/L (ref 96–112)
Creatinine, Ser: 1.78 mg/dL — ABNORMAL HIGH (ref 0.40–1.20)
GFR: 29 mL/min — ABNORMAL LOW (ref >60.00)
Glucose, Bld: 87 mg/dL (ref 70–99)
Potassium: 3.7 mEq/L (ref 3.5–5.1)
SODIUM: 139 meq/L (ref 135–145)
TOTAL PROTEIN: 6.9 g/dL (ref 6.0–8.3)
Total Bilirubin: 0.8 mg/dL (ref 0.2–1.2)

## 2015-04-23 NOTE — Progress Notes (Signed)
Pre visit review using our clinic review tool, if applicable. No additional management support is needed unless otherwise documented below in the visit note.  Pt here for BP check per result note by Dr. [name] 04/16/15: "Drop Amlodipine to 5 mg daily and add Maxzide 37.5/25 tab, 1 tab po daily, disp #30 then have her come in in one week for RN BP check and cmp."  Pt is unsure if she has taken her BP meds this morning. Her son states she has taken all of her morning medications and this this includes her BP medication. Pt and her son are unfamiliar w/ the names of her medications and did not bring meds w/ them to appt.  Per Dr. [name]: Continue current regimen. CMP today.  Pt and her son aware of instructions and verbalize understanding.   Dorrene German, RN

## 2015-04-23 NOTE — Progress Notes (Signed)
RN blood check note reviewed. Agree with documention and plan. 

## 2015-04-24 ENCOUNTER — Telehealth: Payer: Self-pay | Admitting: *Deleted

## 2015-04-24 NOTE — Telephone Encounter (Signed)
Received Home Health Certification and Plan of Care; forwarded to provider/SLS 02/08

## 2015-04-25 ENCOUNTER — Telehealth: Payer: Self-pay | Admitting: Family Medicine

## 2015-04-25 NOTE — Telephone Encounter (Signed)
Of course, please let her know if she needs anything for the stress of this to let me know.

## 2015-04-25 NOTE — Telephone Encounter (Signed)
HHRN informed of PCP instructions. 

## 2015-04-25 NOTE — Telephone Encounter (Signed)
Caller name: Roselie Awkward with [facility] Can be reached: [phone]  Reason for call: Pt requested not to be seen end of this week. She will miss 1 PT and 1 OT visit this week due to a death in the pts family. Pts son passed away.

## 2015-04-26 NOTE — Telephone Encounter (Signed)
Faxed, sent to scan/SLS 02/10

## 2015-04-28 NOTE — Progress Notes (Signed)
RN blood check note reviewed. Agree with documention and plan. 

## 2015-04-29 ENCOUNTER — Encounter: Payer: Self-pay | Admitting: Family Medicine

## 2015-05-04 ENCOUNTER — Encounter: Payer: Self-pay | Admitting: Family Medicine

## 2015-05-04 DIAGNOSIS — F418 Other specified anxiety disorders: Secondary | ICD-10-CM

## 2015-05-06 MED ORDER — CITALOPRAM HYDROBROMIDE 20 MG PO TABS: 20.0000 mg | ORAL_TABLET | Freq: Every day | ORAL | Status: DC

## 2015-05-06 NOTE — Telephone Encounter (Signed)
Rx sent 

## 2015-05-09 ENCOUNTER — Ambulatory Visit: Payer: Medicare Other

## 2015-05-09 ENCOUNTER — Other Ambulatory Visit: Payer: Medicare Other

## 2015-05-13 ENCOUNTER — Telehealth: Payer: Self-pay | Admitting: Family Medicine

## 2015-05-13 ENCOUNTER — Ambulatory Visit: Payer: Medicare Other | Admitting: Family Medicine

## 2015-05-13 NOTE — Telephone Encounter (Signed)
No charge. 

## 2015-05-13 NOTE — Telephone Encounter (Signed)
Son states he had two different appointment dates and apologies for patient missing her appointment for today. Charge or no charge

## 2015-05-27 ENCOUNTER — Encounter: Payer: Self-pay | Admitting: Family Medicine

## 2015-05-27 ENCOUNTER — Ambulatory Visit (INDEPENDENT_AMBULATORY_CARE_PROVIDER_SITE_OTHER): Payer: Medicare Other | Admitting: Family Medicine

## 2015-05-27 VITALS — BP 132/72 | HR 53 | Temp 97.7°F | Ht 66.0 in | Wt 157.0 lb

## 2015-05-27 DIAGNOSIS — R001 Bradycardia, unspecified: Secondary | ICD-10-CM

## 2015-05-27 DIAGNOSIS — E119 Type 2 diabetes mellitus without complications: Secondary | ICD-10-CM

## 2015-05-27 DIAGNOSIS — F32A Depression, unspecified: Secondary | ICD-10-CM

## 2015-05-27 DIAGNOSIS — F0390 Unspecified dementia without behavioral disturbance: Secondary | ICD-10-CM

## 2015-05-27 DIAGNOSIS — F329 Major depressive disorder, single episode, unspecified: Secondary | ICD-10-CM

## 2015-05-27 DIAGNOSIS — Z794 Long term (current) use of insulin: Secondary | ICD-10-CM

## 2015-05-27 DIAGNOSIS — E785 Hyperlipidemia, unspecified: Secondary | ICD-10-CM | POA: Diagnosis not present

## 2015-05-27 NOTE — Progress Notes (Signed)
Pre visit review using our clinic review tool, if applicable. No additional management support is needed unless otherwise documented below in the visit note. 

## 2015-05-27 NOTE — Assessment & Plan Note (Signed)
Asymptomatic, doing well.

## 2015-05-27 NOTE — Assessment & Plan Note (Signed)
Her son died very suddenly since her last visit but she is managing fairly well at this time

## 2015-05-27 NOTE — Assessment & Plan Note (Signed)
Struggles but with meds and family support is doing well at home.

## 2015-05-27 NOTE — Assessment & Plan Note (Signed)
Using Lantus 22 units daily, not using Humalog, encouraged to minimize carbs and check sugars daily recheck hgba1c in 8 weeks

## 2015-05-27 NOTE — Progress Notes (Signed)
Subjective:    Patient ID: Destiny [name], female    DOB: 1931/09/15, 82 y.o.   MRN: [id number]  Chief Complaint  Patient presents with  . Follow-up    HPI Patient is in today for follow up for hypertension.  Patient has been doing well, b/p looks good today and will begin having an aide come out to home to help with ADL's and she has emergency buttons that she wears on her in case of emergency. Her son has died suddenly and is accompanied by her daughter in law today who reports she is doing well but she does forget to take her meds and eat at times. Denies CP/palp/SOB/HA/congestion/fevers/GI or GU c/o. Taking meds as prescribed   Past Medical History  Diagnosis Date  . PVD (peripheral vascular disease) ([place])   . CAD (coronary artery disease)     a. s/p CABG x 4 (LIMA->LAD, VG->Diag, VG->OM1->OM3);  b. 2010 Cath: 3/4 patent grafts (VG->diag occluded), 3vd, sev PVD ->Med Rx;  c. 12/2011 Lexi MV: sm area of mild mid and apical ant wall ischemia ->med rx.  . Hypertension   . Hyperlipidemia   . Duodenal ulcer 2009 and 11/2011    caused GI bleeding  . Iron deficiency anemia secondary to blood loss (chronic)   . Amaurosis fugax   . Carotid bruit   . Diabetes mellitus type II     insulin dependent.   . Dementia     pt denies  . Diabetic retinopathy   . Gallstones 2009    seen on CT scan  . Diverticulosis   . Atherosclerosis   . Chronic kidney disease (CKD) stage G4/A1, severely decreased glomerular filtration rate (GFR) between 15-29 mL/min/1.73 square meter and albuminuria creatinine ratio less than 30 mg/g (HCC)     GFR 25 on 12/25/13  . CVA (cerebral infarction)   . Diabetic peripheral neuropathy ([place])   . GI bleed 04/2011, 11/2011    found non-bleeding duodenal ulcers  . Arthritis 12/06/2012  . Hypersomnolence 03/12/2013  . Acute renal insufficiency 12/24/2013  . Depression 12/24/2013  . Melena 1015/2015    PEPTIC ULCER    REQUIRING TRANSFUSION  . Weight loss,  non-intentional 01/29/2014  . UTI (urinary tract infection) 04/08/2014  . Rib fracture 04/21/2015    Past Surgical History  Procedure Laterality Date  . Coronary artery bypass graft  1999    x 4  . Abdominal hysterectomy    . Upper gastrointestinal endoscopy  02/22/2008    w/biopsy, duedenal ulcer, antrum erosions  . Esophagogastroduodenoscopy  05/16/2011    Procedure: ESOPHAGOGASTRODUODENOSCOPY (EGD);  Surgeon: Gatha Mayer, MD;  Location[facility] ENDOSCOPY;  Service: Endoscopy;  Laterality: N/A;  . Colonoscopy  05/20/2011    Procedure: COLONOSCOPY;  Surgeon: Scarlette Shorts, MD;  Location: [place];  Service: Endoscopy;  Laterality: N/A;  . Esophagogastroduodenoscopy  12/08/2011    Procedure: ESOPHAGOGASTRODUODENOSCOPY (EGD);  Surgeon: Ladene Artist, MD,FACG;  Location: [facility] ENDOSCOPY;  Service: Endoscopy;  Laterality: N/A;  . Enteroscopy N/A 12/29/2013    Procedure: ENTEROSCOPY;  Surgeon: Inda Castle, MD;  Location: [place];  Service: Endoscopy;  Laterality: N/A;    Family History  Problem Relation Age of Onset  . Coronary artery disease Father   . Hypertension Father   . Stroke Father   . Cancer Mother     ?  . Diabetes Maternal Uncle   . Colon cancer Neg Hx     Social History   Social History  .  Marital Status: Widowed    Spouse Name: N/A  . Number of Children: 4  . Years of Education: N/A   Occupational History  . retired    Social History Main Topics  . Smoking status: Former Smoker    Types: Cigarettes    Quit date: 12/31/2000  . Smokeless tobacco: Never Used  . Alcohol Use: No  . Drug Use: No  . Sexual Activity: Not on file   Other Topics Concern  . Not on file   Social History Narrative   Widowed - husband passed away in 05-18-09   lives with daughter with epilepsy - mental retardation   Retired     Former Smoker      Alcohol use-no          Occupation: Retired       son - Drucie Scallon     Outpatient Prescriptions Prior to Visit  Medication Sig  Dispense Refill  . amLODipine (NORVASC) 10 MG tablet Take 1 tablet (10 mg total) by mouth daily. 30 tablet 1  . atorvastatin (LIPITOR) 20 MG tablet Take 20 mg by mouth daily.    . cefdinir (OMNICEF) 300 MG capsule Take 300 mg by mouth 2 (two) times daily. Take for 5 days.    . Cholecalciferol (VITAMIN D) 1000 UNITS capsule Take 1,000 Units by mouth every evening.     . citalopram (CELEXA) 20 MG tablet Take 1 tablet (20 mg total) by mouth daily. 90 tablet 1  . clopidogrel (PLAVIX) 75 MG tablet Take 1 tablet (75 mg total) by mouth daily. 90 tablet 1  . donepezil (ARICEPT) 10 MG tablet Take 1 tablet (10 mg total) by mouth at bedtime. 90 tablet 1  . Ferrous Fumarate-Folic Acid (HEMATINIC/FOLIC ACID) 99991111 MG TABS Take 1 tablet by mouth daily. 30 each 3  . Ferrous Sulfate (IRON) 325 (65 FE) MG TABS Take 325 mg by mouth 2 (two) times daily.     . furosemide (LASIX) 20 MG tablet TAKE 1 TABLET (20 MG TOTAL) BY MOUTH DAILY. 90 tablet 1  . Insulin Glargine (LANTUS SOLOSTAR) 100 UNIT/ML Solostar Pen INJECT 20-22 UNITS INTO THE SKIN in AM 15 pen 3  . insulin lispro (HUMALOG KWIKPEN) 100 UNIT/ML KiwkPen Inject 0.05-0.1 mLs (5-10 Units total) into the skin 3 (three) times daily with meals. 15 mL 4  . Insulin Pen Needle (B-D ULTRAFINE III SHORT PEN) 31G X 8 MM MISC Use as directed for insulin 100 each 6  . isosorbide mononitrate (IMDUR) 60 MG 24 hr tablet Take 1 tablet (60 mg total) by mouth daily. 90 tablet 2  . nitroGLYCERIN (NITROSTAT) 0.4 MG SL tablet Place 0.4 mg under the tongue every 5 (five) minutes as needed for chest pain.    . Omega-3 Fatty Acids (FISH OIL) 1200 MG CAPS Take 1,200 mg by mouth daily.     . pantoprazole (PROTONIX) 40 MG tablet Take 1 tablet every Monday, Wednesday and Friday. 30 tablet 5  . ranitidine (ZANTAC) 150 MG tablet Take 150 mg by mouth at bedtime.    . thiamine 100 MG tablet Take 1 tablet (100 mg total) by mouth daily. 30 tablet 3  . traMADol-acetaminophen (ULTRACET) 37.5-325  MG tablet Take 1 tablet by mouth every 6 (six) hours as needed for moderate pain or severe pain.    Marland Kitchen triamterene-hydrochlorothiazide (MAXZIDE-25) 37.5-25 MG tablet Take 1 tablet by mouth daily. 30 tablet 1   No facility-administered medications prior to visit.    No Known Allergies  Review of Systems  Constitutional: Negative for fever and malaise/fatigue.  HENT: Negative for congestion.   Eyes: Negative for blurred vision.  Respiratory: Negative for shortness of breath.   Cardiovascular: Negative for chest pain, palpitations and leg swelling.  Gastrointestinal: Negative for nausea, abdominal pain and blood in stool.  Genitourinary: Negative for dysuria and frequency.  Musculoskeletal: Negative for falls.  Skin: Negative for rash.  Neurological: Negative for dizziness, loss of consciousness and headaches.  Endo/Heme/Allergies: Negative for environmental allergies.  Psychiatric/Behavioral: Positive for memory loss. Negative for depression. The patient is not nervous/anxious.        Objective:    Physical Exam  Constitutional: She is oriented to person, place, and time. She appears well-developed and well-nourished. No distress.  HENT:  Head: Normocephalic and atraumatic.  Nose: Nose normal.  Eyes: Right eye exhibits no discharge. Left eye exhibits no discharge.  Neck: Normal range of motion. Neck supple.  Cardiovascular: Normal rate and regular rhythm.   Murmur heard. Pulmonary/Chest: Effort normal and breath sounds normal.  Abdominal: Soft. Bowel sounds are normal. There is no tenderness.  Musculoskeletal: She exhibits no edema.  Neurological: She is alert and oriented to person, place, and time.  Skin: Skin is warm and dry.  Psychiatric: She has a normal mood and affect.  Nursing note and vitals reviewed.   There were no vitals taken for this visit. Wt Readings from Last 3 Encounters:  04/19/15 160 lb 6.4 oz (72.757 kg)  04/15/15 160 lb 8 oz (72.802 kg)  04/14/15 155  lb (70.308 kg)     Lab Results  Component Value Date   WBC 9.2 04/15/2015   HGB 14.4 04/15/2015   HCT 42.8 04/15/2015   PLT 254.0 04/15/2015   GLUCOSE 87 04/23/2015   CHOL 240* 04/15/2015   TRIG 246.0* 04/15/2015   HDL 54.30 04/15/2015   LDLDIRECT 142.0 04/15/2015   LDLCALC 129* 12/31/2014   ALT 10 04/23/2015   AST 13 04/23/2015   NA 139 04/23/2015   K 3.7 04/23/2015   CL 105 04/23/2015   CREATININE 1.78* 04/23/2015   BUN 38* 04/23/2015   CO2 27 04/23/2015   TSH 2.45 04/15/2015   INR 1.27 12/28/2013   HGBA1C 9.4* 04/15/2015   MICROALBUR 93.2* 04/15/2015    Lab Results  Component Value Date   TSH 2.45 04/15/2015   Lab Results  Component Value Date   WBC 9.2 04/15/2015   HGB 14.4 04/15/2015   HCT 42.8 04/15/2015   MCV 90.7 04/15/2015   PLT 254.0 04/15/2015   Lab Results  Component Value Date   NA 139 04/23/2015   K 3.7 04/23/2015   CO2 27 04/23/2015   GLUCOSE 87 04/23/2015   BUN 38* 04/23/2015   CREATININE 1.78* 04/23/2015   BILITOT 0.8 04/23/2015   ALKPHOS 127* 04/23/2015   AST 13 04/23/2015   ALT 10 04/23/2015   PROT 6.9 04/23/2015   ALBUMIN 3.4* 04/23/2015   CALCIUM 9.3 04/23/2015   ANIONGAP 12 12/29/2013   GFR 29.00* 04/23/2015   Lab Results  Component Value Date   CHOL 240* 04/15/2015   Lab Results  Component Value Date   HDL 54.30 04/15/2015   Lab Results  Component Value Date   LDLCALC 129* 12/31/2014   Lab Results  Component Value Date   TRIG 246.0* 04/15/2015   Lab Results  Component Value Date   CHOLHDL 4 04/15/2015   Lab Results  Component Value Date   HGBA1C 9.4* 04/15/2015  Assessment & Plan:   Problem List Items Addressed This Visit    None      I am having Ms. Deramo maintain her Iron, Vitamin D, Fish Oil, nitroGLYCERIN, insulin lispro, Insulin Glargine, Insulin Pen Needle, atorvastatin, ranitidine, pantoprazole, isosorbide mononitrate, donepezil, furosemide, Ferrous Fumarate-Folic Acid, clopidogrel,  amLODipine, triamterene-hydrochlorothiazide, cefdinir, traMADol-acetaminophen, thiamine, and citalopram.  No orders of the defined types were placed in this encounter.     Jan Fireman, [place]

## 2015-05-27 NOTE — Assessment & Plan Note (Signed)
Tolerating statin, encouraged heart healthy diet, avoid trans fats, minimize simple carbs and saturated fats. Increase exercise as tolerated 

## 2015-05-27 NOTE — Patient Instructions (Addendum)
Keep a recordof your daily blood pressure.  Check sugars 1 hour before eating and check sugars 1 hour after eating for next 3-4  weeks 3-4 times a week. Basic Carbohydrate Counting for Diabetes Mellitus Carbohydrate counting is a method for keeping track of the amount of carbohydrates you eat. Eating carbohydrates naturally increases the level of sugar (glucose) in your blood, so it is important for you to know the amount that is okay for you to have in every meal. Carbohydrate counting helps keep the level of glucose in your blood within normal limits. The amount of carbohydrates allowed is different for every person. A dietitian can help you calculate the amount that is right for you. Once you know the amount of carbohydrates you can have, you can count the carbohydrates in the foods you want to eat. Carbohydrates are found in the following foods:  Grains, such as breads and cereals.  Dried beans and soy products.  Starchy vegetables, such as potatoes, peas, and corn.  Fruit and fruit juices.  Milk and yogurt.  Sweets and snack foods, such as cake, cookies, candy, chips, soft drinks, and fruit drinks. CARBOHYDRATE COUNTING There are two ways to count the carbohydrates in your food. You can use either of the methods or a combination of both. Reading the "Nutrition Facts" on [place] The "Nutrition Facts" is an area that is included on the labels of almost all packaged food and beverages in the [place]. It includes the serving size of that food or beverage and information about the nutrients in each serving of the food, including the grams (g) of carbohydrate per serving.  Decide the number of servings of this food or beverage that you will be able to eat or drink. Multiply that number of servings by the number of grams of carbohydrate that is listed on the label for that serving. The total will be the amount of carbohydrates you will be having when you eat or drink this food or  beverage. Learning Standard Serving Sizes of Food When you eat food that is not packaged or does not include "Nutrition Facts" on the label, you need to measure the servings in order to count the amount of carbohydrates.A serving of most carbohydrate-rich foods contains about 15 g of carbohydrates. The following list includes serving sizes of carbohydrate-rich foods that provide 15 g ofcarbohydrate per serving:   1 slice of bread (1 oz) or 1 six-inch tortilla.    of a hamburger bun or English muffin.  4-6 crackers.   cup unsweetened dry cereal.    cup hot cereal.   cup rice or pasta.    cup mashed potatoes or  of a large baked potato.  1 cup fresh fruit or one small piece of fruit.    cup canned or frozen fruit or fruit juice.  1 cup milk.   cup plain fat-free yogurt or yogurt sweetened with artificial sweeteners.   cup cooked dried beans or starchy vegetable, such as peas, corn, or potatoes.  Decide the number of standard-size servings that you will eat. Multiply that number of servings by 15 (the grams of carbohydrates in that serving). For example, if you eat 2 cups of strawberries, you will have eaten 2 servings and 30 g of carbohydrates (2 servings x 15 g = 30 g). For foods such as soups and casseroles, in which more than one food is mixed in, you will need to count the carbohydrates in each food that is included. EXAMPLE OF  CARBOHYDRATE COUNTING Sample Dinner  3 oz chicken breast.   cup of brown rice.   cup of corn.  1 cup milk.   1 cup strawberries with sugar-free whipped topping.  Carbohydrate Calculation Step 1: Identify the foods that contain carbohydrates:   Rice.   Corn.   Milk.   Strawberries. Step 2:Calculate the number of servings eaten of each:   2 servings of rice.   1 serving of corn.   1 serving of milk.   1 serving of strawberries. Step 3: Multiply each of those number of servings by 15 g:   2 servings of  rice x 15 g = 30 g.   1 serving of corn x 15 g = 15 g.   1 serving of milk x 15 g = 15 g.   1 serving of strawberries x 15 g = 15 g. Step 4: Add together all of the amounts to find the total grams of carbohydrates eaten: 30 g + 15 g + 15 g + 15 g = 75 g.   This information is not intended to replace advice given to you by your health care provider. Make sure you discuss any questions you have with your health care provider.   Document Released: 03/02/2005 Document Revised: 03/23/2014 Document Reviewed: 01/27/2013 Elsevier Interactive Patient Education [employer].

## 2015-05-28 ENCOUNTER — Other Ambulatory Visit: Payer: Self-pay | Admitting: Family Medicine

## 2015-06-01 ENCOUNTER — Encounter: Payer: Self-pay | Admitting: Family Medicine

## 2015-06-03 ENCOUNTER — Other Ambulatory Visit: Payer: Self-pay | Admitting: Family Medicine

## 2015-06-03 MED ORDER — INSULIN LISPRO 100 UNIT/ML (KWIKPEN): 5.0000 [IU] | PEN_INJECTOR | Freq: Three times a day (TID) | SUBCUTANEOUS | Status: DC

## 2015-06-12 LAB — BASIC METABOLIC PANEL
CREATININE: 2.2 mg/dL — AB (ref <=1.1)
Glucose: 215 mg/dL
POTASSIUM: 4.7 mmol/L (ref 3.4–5.3)
Sodium: 142 mmol/L (ref 137–147)

## 2015-06-14 ENCOUNTER — Encounter: Payer: Self-pay | Admitting: Family Medicine

## 2015-06-27 ENCOUNTER — Other Ambulatory Visit: Payer: Self-pay | Admitting: Family Medicine

## 2015-07-07 ENCOUNTER — Other Ambulatory Visit: Payer: Self-pay | Admitting: Family Medicine

## 2015-07-17 ENCOUNTER — Encounter: Payer: Self-pay | Admitting: Family Medicine

## 2015-08-01 ENCOUNTER — Ambulatory Visit: Payer: Self-pay | Admitting: Family Medicine

## 2015-08-11 ENCOUNTER — Other Ambulatory Visit: Payer: Self-pay | Admitting: Family Medicine

## 2015-08-13 ENCOUNTER — Ambulatory Visit (INDEPENDENT_AMBULATORY_CARE_PROVIDER_SITE_OTHER): Payer: Medicare Other | Admitting: Family Medicine

## 2015-08-13 ENCOUNTER — Encounter: Payer: Self-pay | Admitting: Family Medicine

## 2015-08-13 VITALS — BP 138/72 | HR 50 | Temp 98.1°F | Ht 66.0 in | Wt 164.1 lb

## 2015-08-13 DIAGNOSIS — N184 Chronic kidney disease, stage 4 (severe): Secondary | ICD-10-CM

## 2015-08-13 DIAGNOSIS — Z794 Long term (current) use of insulin: Secondary | ICD-10-CM

## 2015-08-13 DIAGNOSIS — J209 Acute bronchitis, unspecified: Secondary | ICD-10-CM | POA: Diagnosis not present

## 2015-08-13 DIAGNOSIS — E519 Thiamine deficiency, unspecified: Secondary | ICD-10-CM

## 2015-08-13 DIAGNOSIS — E663 Overweight: Secondary | ICD-10-CM | POA: Diagnosis not present

## 2015-08-13 DIAGNOSIS — R001 Bradycardia, unspecified: Secondary | ICD-10-CM

## 2015-08-13 DIAGNOSIS — D500 Iron deficiency anemia secondary to blood loss (chronic): Secondary | ICD-10-CM | POA: Diagnosis not present

## 2015-08-13 DIAGNOSIS — E538 Deficiency of other specified B group vitamins: Secondary | ICD-10-CM

## 2015-08-13 DIAGNOSIS — I10 Essential (primary) hypertension: Secondary | ICD-10-CM

## 2015-08-13 DIAGNOSIS — E119 Type 2 diabetes mellitus without complications: Secondary | ICD-10-CM

## 2015-08-13 HISTORY — DX: Thiamine deficiency, unspecified: E51.9

## 2015-08-13 MED ORDER — INSULIN GLARGINE 100 UNIT/ML SOLOSTAR PEN: 26.0000 [IU] | PEN_INJECTOR | Freq: Every day | SUBCUTANEOUS | Status: DC

## 2015-08-13 MED ORDER — CEFDINIR 300 MG PO CAPS: 300.0000 mg | ORAL_CAPSULE | Freq: Two times a day (BID) | ORAL | Status: AC

## 2015-08-13 NOTE — Patient Instructions (Signed)
For sugar increase Lantus to 26 units today then increase by 2 units every 3 days til start to see sugars under 120 then hold can add Humalog at dinner if only evening sugars spike  Encouraged increased rest and hydration, add probiotics, zinc such as Coldeze or Xicam. Treat fevers as needed. Plain mucinex twice daily x 10 days.  Elderberry liquid as needed for cold symptoms, sore throat or cough  Basic Carbohydrate Counting for Diabetes Mellitus Carbohydrate counting is a method for keeping track of the amount of carbohydrates you eat. Eating carbohydrates naturally increases the level of sugar (glucose) in your blood, so it is important for you to know the amount that is okay for you to have in every meal. Carbohydrate counting helps keep the level of glucose in your blood within normal limits. The amount of carbohydrates allowed is different for every person. A dietitian can help you calculate the amount that is right for you. Once you know the amount of carbohydrates you can have, you can count the carbohydrates in the foods you want to eat. Carbohydrates are found in the following foods:  Grains, such as breads and cereals.  Dried beans and soy products.  Starchy vegetables, such as potatoes, peas, and corn.  Fruit and fruit juices.  Milk and yogurt.  Sweets and snack foods, such as cake, cookies, candy, chips, soft drinks, and fruit drinks. CARBOHYDRATE COUNTING There are two ways to count the carbohydrates in your food. You can use either of the methods or a combination of both. Reading the "Nutrition Facts" on [place] The "Nutrition Facts" is an area that is included on the labels of almost all packaged food and beverages in the [place]. It includes the serving size of that food or beverage and information about the nutrients in each serving of the food, including the grams (g) of carbohydrate per serving.  Decide the number of servings of this food or beverage that you  will be able to eat or drink. Multiply that number of servings by the number of grams of carbohydrate that is listed on the label for that serving. The total will be the amount of carbohydrates you will be having when you eat or drink this food or beverage. Learning Standard Serving Sizes of Food When you eat food that is not packaged or does not include "Nutrition Facts" on the label, you need to measure the servings in order to count the amount of carbohydrates.A serving of most carbohydrate-rich foods contains about 15 g of carbohydrates. The following list includes serving sizes of carbohydrate-rich foods that provide 15 g ofcarbohydrate per serving:   1 slice of bread (1 oz) or 1 six-inch tortilla.    of a hamburger bun or English muffin.  4-6 crackers.   cup unsweetened dry cereal.    cup hot cereal.   cup rice or pasta.    cup mashed potatoes or  of a large baked potato.  1 cup fresh fruit or one small piece of fruit.    cup canned or frozen fruit or fruit juice.  1 cup milk.   cup plain fat-free yogurt or yogurt sweetened with artificial sweeteners.   cup cooked dried beans or starchy vegetable, such as peas, corn, or potatoes.  Decide the number of standard-size servings that you will eat. Multiply that number of servings by 15 (the grams of carbohydrates in that serving). For example, if you eat 2 cups of strawberries, you will have eaten 2 servings and  30 g of carbohydrates (2 servings x 15 g = 30 g). For foods such as soups and casseroles, in which more than one food is mixed in, you will need to count the carbohydrates in each food that is included. EXAMPLE OF CARBOHYDRATE COUNTING Sample Dinner  3 oz chicken breast.   cup of brown rice.   cup of corn.  1 cup milk.   1 cup strawberries with sugar-free whipped topping.  Carbohydrate Calculation Step 1: Identify the foods that contain carbohydrates:   Rice.   Corn.   Milk.    Strawberries. Step 2:Calculate the number of servings eaten of each:   2 servings of rice.   1 serving of corn.   1 serving of milk.   1 serving of strawberries. Step 3: Multiply each of those number of servings by 15 g:   2 servings of rice x 15 g = 30 g.   1 serving of corn x 15 g = 15 g.   1 serving of milk x 15 g = 15 g.   1 serving of strawberries x 15 g = 15 g. Step 4: Add together all of the amounts to find the total grams of carbohydrates eaten: 30 g + 15 g + 15 g + 15 g = 75 g.   This information is not intended to replace advice given to you by your health care provider. Make sure you discuss any questions you have with your health care provider.   Document Released: 03/02/2005 Document Revised: 03/23/2014 Document Reviewed: 01/27/2013 Elsevier Interactive Patient Education [employer].

## 2015-08-13 NOTE — Progress Notes (Signed)
Pre visit review using our clinic review tool, if applicable. No additional management support is needed unless otherwise documented below in the visit note. 

## 2015-08-18 DIAGNOSIS — J209 Acute bronchitis, unspecified: Secondary | ICD-10-CM | POA: Insufficient documentation

## 2015-08-18 NOTE — Assessment & Plan Note (Signed)
Well controlled, no changes to meds. Encouraged heart healthy diet such as the DASH diet and exercise as tolerated.  °

## 2015-08-18 NOTE — Progress Notes (Signed)
Patient ID: Destiny [name], female   DOB: 02-05-1932, 82 y.o.   MRN: [id number]   Subjective:    Patient ID: Destiny [name], female    DOB: Aug 25, 1931, 82 y.o.   MRN: [id number]  Chief Complaint  Patient presents with  . Follow-up    HPI Patient is in today for follow up accompanied by her son. She feels weel today but has been struggling with a mild cough and congestion this past week. No fevers, nausea, diarrhea or chest pain. She is noting some tightness and sob of breath at times but it is improving. Sugars have been running hi with a low of 201 to a high of 494. No polyuria or polydipsia.  Past Medical History  Diagnosis Date  . PVD (peripheral vascular disease) ([place])   . CAD (coronary artery disease)     a. s/p CABG x 4 (LIMA->LAD, VG->Diag, VG->OM1->OM3);  b. 2010 Cath: 3/4 patent grafts (VG->diag occluded), 3vd, sev PVD ->Med Rx;  c. 12/2011 Lexi MV: sm area of mild mid and apical ant wall ischemia ->med rx.  . Hypertension   . Hyperlipidemia   . Duodenal ulcer 2009 and 11/2011    caused GI bleeding  . Iron deficiency anemia secondary to blood loss (chronic)   . Amaurosis fugax   . Carotid bruit   . Diabetes mellitus type II     insulin dependent.   . Dementia     pt denies  . Diabetic retinopathy   . Gallstones 2009    seen on CT scan  . Diverticulosis   . Atherosclerosis   . Chronic kidney disease (CKD) stage G4/A1, severely decreased glomerular filtration rate (GFR) between 15-29 mL/min/1.73 square meter and albuminuria creatinine ratio less than 30 mg/g (HCC)     GFR 25 on 12/25/13  . CVA (cerebral infarction)   . Diabetic peripheral neuropathy ([place])   . GI bleed 04/2011, 11/2011    found non-bleeding duodenal ulcers  . Arthritis 12/06/2012  . Hypersomnolence 03/12/2013  . Acute renal insufficiency 12/24/2013  . Depression 12/24/2013  . Melena 1015/2015    PEPTIC ULCER    REQUIRING TRANSFUSION  . Weight loss, non-intentional 01/29/2014  . UTI (urinary tract  infection) 04/08/2014  . Rib fracture 04/21/2015  . Thiamine deficiency 08/13/2015    Past Surgical History  Procedure Laterality Date  . Coronary artery bypass graft  1999    x 4  . Abdominal hysterectomy    . Upper gastrointestinal endoscopy  02/22/2008    w/biopsy, duedenal ulcer, antrum erosions  . Esophagogastroduodenoscopy  05/16/2011    Procedure: ESOPHAGOGASTRODUODENOSCOPY (EGD);  Surgeon: Gatha Mayer, MD;  Location: [facility] ENDOSCOPY;  Service: Endoscopy;  Laterality: N/A;  . Colonoscopy  05/20/2011    Procedure: COLONOSCOPY;  Surgeon: Scarlette Shorts, MD;  Location: [place];  Service: Endoscopy;  Laterality: N/A;  . Esophagogastroduodenoscopy  12/08/2011    Procedure: ESOPHAGOGASTRODUODENOSCOPY (EGD);  Surgeon: Ladene Artist, MD,FACG;  Location: [facility] ENDOSCOPY;  Service: Endoscopy;  Laterality: N/A;  . Enteroscopy N/A 12/29/2013    Procedure: ENTEROSCOPY;  Surgeon: Inda Castle, MD;  Location: [place];  Service: Endoscopy;  Laterality: N/A;    Family History  Problem Relation Age of Onset  . Coronary artery disease Father   . Hypertension Father   . Stroke Father   . Cancer Mother     ?  . Diabetes Maternal Uncle   . Colon cancer Neg Hx     Social History   Social History  .  Marital Status: Widowed    Spouse Name: N/A  . Number of Children: 4  . Years of Education: N/A   Occupational History  . retired    Social History Main Topics  . Smoking status: Former Smoker    Types: Cigarettes    Quit date: 12/31/2000  . Smokeless tobacco: Never Used  . Alcohol Use: No  . Drug Use: No  . Sexual Activity: Not on file   Other Topics Concern  . Not on file   Social History Narrative   Widowed - husband passed away in 05-19-2009   lives with daughter with epilepsy - mental retardation   Retired     Former Smoker      Alcohol use-no          Occupation: Retired       son - Destiny [name]     Outpatient Prescriptions Prior to Visit  Medication Sig Dispense Refill   . amLODipine (NORVASC) 10 MG tablet Take 1 tablet (10 mg total) by mouth daily. (Patient taking differently: Take 1/2 tablet daily.) 30 tablet 1  . atorvastatin (LIPITOR) 20 MG tablet Take 20 mg by mouth daily.    . Cholecalciferol (VITAMIN D) 1000 UNITS capsule Take 1,000 Units by mouth every evening.     . citalopram (CELEXA) 20 MG tablet Take 1 tablet (20 mg total) by mouth daily. 90 tablet 1  . clopidogrel (PLAVIX) 75 MG tablet Take 1 tablet (75 mg total) by mouth daily. 90 tablet 1  . donepezil (ARICEPT) 10 MG tablet Take 1 tablet (10 mg total) by mouth at bedtime. 90 tablet 1  . Ferrous Sulfate (IRON) 325 (65 FE) MG TABS Take 325 mg by mouth 2 (two) times daily.     . furosemide (LASIX) 20 MG tablet TAKE 1 TABLET (20 MG TOTAL) BY MOUTH DAILY. (Patient taking differently: Not taking at this time) 90 tablet 1  . HEMATINIC/FOLIC ACID 99991111 MG TABS TAKE 1 TABLET BY MOUTH DAILY 30 each 3  . insulin lispro (HUMALOG KWIKPEN) 100 UNIT/ML KiwkPen Inject 0.05-0.1 mLs (5-10 Units total) into the skin 3 (three) times daily with meals. 15 mL 4  . Insulin Pen Needle (B-D ULTRAFINE III SHORT PEN) 31G X 8 MM MISC Use as directed for insulin 100 each 6  . isosorbide mononitrate (IMDUR) 60 MG 24 hr tablet Take 1 tablet (60 mg total) by mouth daily. 90 tablet 2  . nitroGLYCERIN (NITROSTAT) 0.4 MG SL tablet Place 0.4 mg under the tongue every 5 (five) minutes as needed for chest pain.    . Omega-3 Fatty Acids (FISH OIL) 1200 MG CAPS Take 1,200 mg by mouth daily.     . pantoprazole (PROTONIX) 40 MG tablet Take 1 tablet every Monday, Wednesday and Friday. 30 tablet 5  . ranitidine (ZANTAC) 150 MG tablet Take 150 mg by mouth at bedtime.    . thiamine (VITAMIN B-1) 100 MG tablet TAKE 1 TABLET(100 MG) BY MOUTH DAILY 30 tablet 0  . traMADol-acetaminophen (ULTRACET) 37.5-325 MG tablet Take 1 tablet by mouth every 6 (six) hours as needed for moderate pain or severe pain.    Marland Kitchen triamterene-hydrochlorothiazide  (MAXZIDE-25) 37.5-25 MG tablet Take 1 tablet by mouth daily. 30 tablet 3  . LANTUS SOLOSTAR 100 UNIT/ML Solostar Pen INJECT 20 UNITS INTO THE SKIN EVERY NIGHT AT BEDTIME (Patient taking differently: INJECT 22 UNITS INTO THE SKIN EVERY NIGHT AT BEDTIME) 15 mL 4   No facility-administered medications prior to visit.  No Known Allergies  Review of Systems  Constitutional: Negative for fever and malaise/fatigue.  HENT: Positive for congestion.   Eyes: Negative for blurred vision.  Respiratory: Positive for cough. Negative for shortness of breath and stridor.   Cardiovascular: Negative for chest pain, palpitations and leg swelling.  Gastrointestinal: Negative for nausea, abdominal pain and blood in stool.  Genitourinary: Negative for dysuria and frequency.  Musculoskeletal: Negative for falls.  Skin: Negative for rash.  Neurological: Negative for dizziness, loss of consciousness and headaches.  Endo/Heme/Allergies: Negative for environmental allergies.  Psychiatric/Behavioral: Negative for depression. The patient is not nervous/anxious.        Objective:    Physical Exam  Constitutional: She is oriented to person, place, and time. She appears well-developed and well-nourished. No distress.  HENT:  Head: Normocephalic and atraumatic.  Nose: Nose normal.  Eyes: Right eye exhibits no discharge. Left eye exhibits no discharge.  Neck: Normal range of motion. Neck supple.  Cardiovascular: Normal rate and regular rhythm.   Pulmonary/Chest: Effort normal and breath sounds normal.  Abdominal: Soft. Bowel sounds are normal. There is no tenderness.  Musculoskeletal: She exhibits no edema.  Neurological: She is alert and oriented to person, place, and time.  Skin: Skin is warm and dry.  Psychiatric: She has a normal mood and affect.  Nursing note and vitals reviewed.   BP 138/72 mmHg  Pulse 50  Temp(Src) 98.1 F (36.7 C) (Oral)  Ht 5\' 6"  (1.676 m)  Wt 164 lb 2 oz (74.447 kg)  BMI  26.50 kg/m2  SpO2 96% Wt Readings from Last 3 Encounters:  08/13/15 164 lb 2 oz (74.447 kg)  05/27/15 157 lb (71.215 kg)  04/19/15 160 lb 6.4 oz (72.757 kg)     Lab Results  Component Value Date   WBC 9.2 04/15/2015   HGB 14.4 04/15/2015   HCT 42.8 04/15/2015   PLT 254.0 04/15/2015   GLUCOSE 87 04/23/2015   CHOL 240* 04/15/2015   TRIG 246.0* 04/15/2015   HDL 54.30 04/15/2015   LDLDIRECT 142.0 04/15/2015   LDLCALC 129* 12/31/2014   ALT 10 04/23/2015   AST 13 04/23/2015   NA 142 06/12/2015   K 4.7 06/12/2015   CL 105 04/23/2015   CREATININE 2.2* 06/12/2015   BUN 38* 04/23/2015   CO2 27 04/23/2015   TSH 2.45 04/15/2015   INR 1.27 12/28/2013   HGBA1C 9.4* 04/15/2015   MICROALBUR 93.2* 04/15/2015    Lab Results  Component Value Date   TSH 2.45 04/15/2015   Lab Results  Component Value Date   WBC 9.2 04/15/2015   HGB 14.4 04/15/2015   HCT 42.8 04/15/2015   MCV 90.7 04/15/2015   PLT 254.0 04/15/2015   Lab Results  Component Value Date   NA 142 06/12/2015   K 4.7 06/12/2015   CO2 27 04/23/2015   GLUCOSE 87 04/23/2015   BUN 38* 04/23/2015   CREATININE 2.2* 06/12/2015   BILITOT 0.8 04/23/2015   ALKPHOS 127* 04/23/2015   AST 13 04/23/2015   ALT 10 04/23/2015   PROT 6.9 04/23/2015   ALBUMIN 3.4* 04/23/2015   CALCIUM 9.3 04/23/2015   ANIONGAP 12 12/29/2013   GFR 29.00* 04/23/2015   Lab Results  Component Value Date   CHOL 240* 04/15/2015   Lab Results  Component Value Date   HDL 54.30 04/15/2015   Lab Results  Component Value Date   LDLCALC 129* 12/31/2014   Lab Results  Component Value Date   TRIG 246.0* 04/15/2015  Lab Results  Component Value Date   CHOLHDL 4 04/15/2015   Lab Results  Component Value Date   HGBA1C 9.4* 04/15/2015       Assessment & Plan:   Problem List Items Addressed This Visit    Thiamine deficiency    Continue supplements and check levels      Relevant Medications   cefdinir (OMNICEF) 300 MG capsule   Other  Relevant Orders   TSH   CBC   Hemoglobin A1c   Lipid panel   Comprehensive metabolic panel   Vitamin 123456   Vitamin B1   Overweight (BMI 25.0-29.9) (Chronic)   Relevant Medications   cefdinir (OMNICEF) 300 MG capsule   Other Relevant Orders   TSH   CBC   Hemoglobin A1c   Lipid panel   Comprehensive metabolic panel   Vitamin 123456   Vitamin B1   Essential hypertension    Well controlled, no changes to meds. Encouraged heart healthy diet such as the DASH diet and exercise as tolerated.       Relevant Medications   cefdinir (OMNICEF) 300 MG capsule   Other Relevant Orders   TSH   CBC   Hemoglobin A1c   Lipid panel   Comprehensive metabolic panel   Vitamin 123456   Vitamin B1   Diabetes mellitus type 2, insulin dependent (HCC) (Chronic)    Increase Lantus to 26 units then continue to increase by 2 units every 3 days if fsg remains above 150 daily      Relevant Medications   cefdinir (OMNICEF) 300 MG capsule   Insulin Glargine (LANTUS SOLOSTAR) 100 UNIT/ML Solostar Pen   Other Relevant Orders   TSH   CBC   Hemoglobin A1c   Lipid panel   Comprehensive metabolic panel   Vitamin 123456   Vitamin B1   CKD (chronic kidney disease) stage 4, GFR 15-29 ml/min (HCC) (Chronic)   Relevant Medications   cefdinir (OMNICEF) 300 MG capsule   Other Relevant Orders   TSH   CBC   Hemoglobin A1c   Lipid panel   Comprehensive metabolic panel   Vitamin 123456   Vitamin B1   Bradycardia    Asymptomatic.       B12 deficiency   Relevant Medications   cefdinir (OMNICEF) 300 MG capsule   Other Relevant Orders   TSH   CBC   Hemoglobin A1c   Lipid panel   Comprehensive metabolic panel   Vitamin 123456   Vitamin B1   Anemia   Relevant Medications   cefdinir (OMNICEF) 300 MG capsule   Other Relevant Orders   TSH   CBC   Hemoglobin A1c   Lipid panel   Comprehensive metabolic panel   Vitamin 123456   Vitamin B1   Acute bronchitis - Primary    Encouraged increased rest and hydration,  add probiotics, zinc such as Coldeze or Xicam. Treat fevers as needed. Mucinex bid, elderberry and consider antibiotics if symptoms worsen.       Relevant Medications   cefdinir (OMNICEF) 300 MG capsule   Other Relevant Orders   TSH   CBC   Hemoglobin A1c   Lipid panel   Comprehensive metabolic panel   Vitamin 123456   Vitamin B1      I have changed Ms. Knoth's LANTUS SOLOSTAR to Insulin Glargine. I am also having her start on cefdinir. Additionally, I am having her maintain her Iron, Vitamin D, Fish Oil, nitroGLYCERIN, Insulin Pen Needle, atorvastatin, ranitidine, pantoprazole, isosorbide mononitrate,  donepezil, furosemide, clopidogrel, amLODipine, traMADol-acetaminophen, citalopram, insulin lispro, triamterene-hydrochlorothiazide, HEMATINIC/FOLIC ACID, and thiamine.  Meds ordered this encounter  Medications  . cefdinir (OMNICEF) 300 MG capsule    Sig: Take 1 capsule (300 mg total) by mouth 2 (two) times daily.    Dispense:  20 capsule    Refill:  0  . Insulin Glargine (LANTUS SOLOSTAR) 100 UNIT/ML Solostar Pen    Sig: Inject 26 Units into the skin daily at 10 pm. Increase by 2 units every 3 days if all fsg>120    Dispense:  15 mL    Refill:  4     BLYTH, STACEY, MD

## 2015-08-18 NOTE — Assessment & Plan Note (Signed)
Increase Lantus to 26 units then continue to increase by 2 units every 3 days if fsg remains above 150 daily

## 2015-08-18 NOTE — Assessment & Plan Note (Signed)
Asymptomatic. 

## 2015-08-18 NOTE — Assessment & Plan Note (Signed)
Encouraged increased rest and hydration, add probiotics, zinc such as Coldeze or Xicam. Treat fevers as needed. Mucinex bid, elderberry and consider antibiotics if symptoms worsen.

## 2015-08-18 NOTE — Assessment & Plan Note (Signed)
Continue supplements and check levels 

## 2015-08-20 ENCOUNTER — Other Ambulatory Visit (INDEPENDENT_AMBULATORY_CARE_PROVIDER_SITE_OTHER): Payer: Medicare Other

## 2015-08-20 DIAGNOSIS — R7989 Other specified abnormal findings of blood chemistry: Secondary | ICD-10-CM | POA: Diagnosis not present

## 2015-08-20 DIAGNOSIS — E663 Overweight: Secondary | ICD-10-CM

## 2015-08-20 DIAGNOSIS — Z794 Long term (current) use of insulin: Secondary | ICD-10-CM

## 2015-08-20 DIAGNOSIS — E119 Type 2 diabetes mellitus without complications: Secondary | ICD-10-CM

## 2015-08-20 DIAGNOSIS — I10 Essential (primary) hypertension: Secondary | ICD-10-CM

## 2015-08-20 DIAGNOSIS — N184 Chronic kidney disease, stage 4 (severe): Secondary | ICD-10-CM

## 2015-08-20 DIAGNOSIS — E538 Deficiency of other specified B group vitamins: Secondary | ICD-10-CM | POA: Diagnosis not present

## 2015-08-20 DIAGNOSIS — J209 Acute bronchitis, unspecified: Secondary | ICD-10-CM | POA: Diagnosis not present

## 2015-08-20 DIAGNOSIS — E519 Thiamine deficiency, unspecified: Secondary | ICD-10-CM

## 2015-08-20 DIAGNOSIS — D500 Iron deficiency anemia secondary to blood loss (chronic): Secondary | ICD-10-CM

## 2015-08-20 LAB — COMPREHENSIVE METABOLIC PANEL
ALBUMIN: 3.7 g/dL (ref 3.5–5.2)
ALT: 10 U/L (ref 0–35)
AST: 11 U/L (ref 0–37)
Alkaline Phosphatase: 72 U/L (ref 39–117)
BILIRUBIN TOTAL: 0.9 mg/dL (ref 0.2–1.2)
BUN: 47 mg/dL — ABNORMAL HIGH (ref 6–23)
CALCIUM: 9.5 mg/dL (ref 8.4–10.5)
CO2: 25 mEq/L (ref 19–32)
Chloride: 105 mEq/L (ref 96–112)
Creatinine, Ser: 2.12 mg/dL — ABNORMAL HIGH (ref 0.40–1.20)
GFR: 23.68 mL/min — ABNORMAL LOW (ref >60.00)
Glucose, Bld: 199 mg/dL — ABNORMAL HIGH (ref 70–99)
Potassium: 4.4 mEq/L (ref 3.5–5.1)
Sodium: 140 mEq/L (ref 135–145)
Total Protein: 6.9 g/dL (ref 6.0–8.3)

## 2015-08-20 LAB — TSH: TSH: 2.12 u[IU]/mL (ref 0.35–4.50)

## 2015-08-20 LAB — CBC
HEMATOCRIT: 38 % (ref 36.0–46.0)
Hemoglobin: 12.7 g/dL (ref 12.0–15.0)
MCHC: 33.5 g/dL (ref 30.0–36.0)
MCV: 92.2 fl (ref 78.0–100.0)
Platelets: 191 10*3/uL (ref 150.0–400.0)
RBC: 4.12 Mil/uL (ref 3.87–5.11)
RDW: 13.6 % (ref 11.5–15.5)
WBC: 7.6 10*3/uL (ref 4.0–10.5)

## 2015-08-20 LAB — LIPID PANEL
Cholesterol: 216 mg/dL — ABNORMAL HIGH (ref 0–200)
HDL: 43.8 mg/dL (ref >39.00)
NonHDL: 172.34
Total CHOL/HDL Ratio: 5
Triglycerides: 242 mg/dL — ABNORMAL HIGH (ref 0.0–149.0)
VLDL: 48.4 mg/dL — ABNORMAL HIGH (ref 0.0–40.0)

## 2015-08-20 LAB — VITAMIN B12: VITAMIN B 12: 620 pg/mL (ref 211–911)

## 2015-08-20 LAB — HEMOGLOBIN A1C: HEMOGLOBIN A1C: 8.7 % — AB (ref 4.6–6.5)

## 2015-08-20 LAB — LDL CHOLESTEROL, DIRECT: LDL DIRECT: 129 mg/dL

## 2015-08-26 LAB — VITAMIN B1: Vitamin B1 (Thiamine): 88 nmol/L — ABNORMAL HIGH (ref 8–30)

## 2015-09-19 ENCOUNTER — Encounter: Payer: Self-pay | Admitting: Family Medicine

## 2015-09-19 ENCOUNTER — Other Ambulatory Visit: Payer: Self-pay | Admitting: Family Medicine

## 2015-09-20 ENCOUNTER — Other Ambulatory Visit: Payer: Self-pay | Admitting: Family Medicine

## 2015-09-20 ENCOUNTER — Ambulatory Visit (HOSPITAL_BASED_OUTPATIENT_CLINIC_OR_DEPARTMENT_OTHER)
Admission: RE | Admit: 2015-09-20 | Discharge: 2015-09-20 | Disposition: A | Discharge status: Home / Self Care | Payer: Medicare Other | Source: Ambulatory Visit | Attending: Family Medicine | Admitting: Family Medicine

## 2015-09-20 DIAGNOSIS — R609 Edema, unspecified: Secondary | ICD-10-CM

## 2015-09-21 ENCOUNTER — Other Ambulatory Visit: Payer: Self-pay | Admitting: Family Medicine

## 2015-11-09 ENCOUNTER — Other Ambulatory Visit: Payer: Self-pay | Admitting: Family Medicine

## 2015-11-09 DIAGNOSIS — F418 Other specified anxiety disorders: Secondary | ICD-10-CM

## 2015-11-26 ENCOUNTER — Ambulatory Visit (INDEPENDENT_AMBULATORY_CARE_PROVIDER_SITE_OTHER): Payer: Medicare Other | Admitting: Family Medicine

## 2015-11-26 VITALS — BP 144/68 | HR 52

## 2015-11-26 DIAGNOSIS — E519 Thiamine deficiency, unspecified: Secondary | ICD-10-CM | POA: Diagnosis not present

## 2015-11-26 DIAGNOSIS — N184 Chronic kidney disease, stage 4 (severe): Secondary | ICD-10-CM

## 2015-11-26 DIAGNOSIS — I10 Essential (primary) hypertension: Secondary | ICD-10-CM | POA: Diagnosis not present

## 2015-11-26 DIAGNOSIS — D500 Iron deficiency anemia secondary to blood loss (chronic): Secondary | ICD-10-CM | POA: Diagnosis not present

## 2015-11-26 DIAGNOSIS — E785 Hyperlipidemia, unspecified: Secondary | ICD-10-CM | POA: Diagnosis not present

## 2015-11-26 DIAGNOSIS — E663 Overweight: Secondary | ICD-10-CM

## 2015-11-26 DIAGNOSIS — E119 Type 2 diabetes mellitus without complications: Secondary | ICD-10-CM | POA: Diagnosis not present

## 2015-11-26 DIAGNOSIS — Z23 Encounter for immunization: Secondary | ICD-10-CM

## 2015-11-26 DIAGNOSIS — E538 Deficiency of other specified B group vitamins: Secondary | ICD-10-CM

## 2015-11-26 DIAGNOSIS — Z794 Long term (current) use of insulin: Secondary | ICD-10-CM

## 2015-11-26 LAB — MICROALBUMIN / CREATININE URINE RATIO
CREATININE, U: 99.3 mg/dL
MICROALB UR: 97.4 mg/dL — AB (ref 0.0–1.9)
Microalb Creat Ratio: 98.1 mg/g — ABNORMAL HIGH (ref 0.0–30.0)

## 2015-11-26 MED ORDER — INSULIN GLARGINE 100 UNIT/ML SOLOSTAR PEN: 32.0000 [IU] | PEN_INJECTOR | Freq: Every day | SUBCUTANEOUS | 4 refills | Status: DC

## 2015-11-26 NOTE — Progress Notes (Signed)
Pre visit review using our clinic review tool, if applicable. No additional management support is needed unless otherwise documented below in the visit note. 

## 2015-11-26 NOTE — Patient Instructions (Signed)

## 2015-11-27 ENCOUNTER — Other Ambulatory Visit (INDEPENDENT_AMBULATORY_CARE_PROVIDER_SITE_OTHER): Payer: Medicare Other

## 2015-11-27 DIAGNOSIS — E663 Overweight: Secondary | ICD-10-CM

## 2015-11-27 DIAGNOSIS — E538 Deficiency of other specified B group vitamins: Secondary | ICD-10-CM | POA: Diagnosis not present

## 2015-11-27 DIAGNOSIS — E785 Hyperlipidemia, unspecified: Secondary | ICD-10-CM

## 2015-11-27 DIAGNOSIS — Z794 Long term (current) use of insulin: Secondary | ICD-10-CM

## 2015-11-27 DIAGNOSIS — E519 Thiamine deficiency, unspecified: Secondary | ICD-10-CM | POA: Diagnosis not present

## 2015-11-27 DIAGNOSIS — I10 Essential (primary) hypertension: Secondary | ICD-10-CM

## 2015-11-27 DIAGNOSIS — E119 Type 2 diabetes mellitus without complications: Secondary | ICD-10-CM

## 2015-11-27 DIAGNOSIS — D500 Iron deficiency anemia secondary to blood loss (chronic): Secondary | ICD-10-CM

## 2015-11-28 LAB — HEMOGLOBIN A1C: HEMOGLOBIN A1C: 8.3 % — AB (ref 4.6–6.5)

## 2015-11-28 LAB — LIPID PANEL
CHOL/HDL RATIO: 5
CHOLESTEROL: 181 mg/dL (ref 0–200)
HDL: 39.7 mg/dL (ref >39.00)
NONHDL: 141.69
TRIGLYCERIDES: 385 mg/dL — AB (ref 0.0–149.0)
VLDL: 77 mg/dL — AB (ref 0.0–40.0)

## 2015-11-28 LAB — COMPREHENSIVE METABOLIC PANEL
ALK PHOS: 75 U/L (ref 39–117)
ALT: 12 U/L (ref 0–35)
AST: 13 U/L (ref 0–37)
Albumin: 3.6 g/dL (ref 3.5–5.2)
BILIRUBIN TOTAL: 0.7 mg/dL (ref 0.2–1.2)
BUN: 41 mg/dL — AB (ref 6–23)
CALCIUM: 9.3 mg/dL (ref 8.4–10.5)
CO2: 29 meq/L (ref 19–32)
CREATININE: 2.15 mg/dL — AB (ref 0.40–1.20)
Chloride: 105 mEq/L (ref 96–112)
GFR: 23.29 mL/min — AB (ref >60.00)
Glucose, Bld: 210 mg/dL — ABNORMAL HIGH (ref 70–99)
Potassium: 5.2 mEq/L — ABNORMAL HIGH (ref 3.5–5.1)
Sodium: 142 mEq/L (ref 135–145)
Total Protein: 6.9 g/dL (ref 6.0–8.3)

## 2015-11-28 LAB — VITAMIN B12: VITAMIN B 12: 522 pg/mL (ref 211–911)

## 2015-11-28 LAB — CBC
HCT: 35 % — ABNORMAL LOW (ref 36.0–46.0)
Hemoglobin: 12.1 g/dL (ref 12.0–15.0)
MCHC: 34.6 g/dL (ref 30.0–36.0)
MCV: 91.4 fl (ref 78.0–100.0)
PLATELETS: 195 10*3/uL (ref 150.0–400.0)
RBC: 3.83 Mil/uL — AB (ref 3.87–5.11)
RDW: 13.7 % (ref 11.5–15.5)
WBC: 8.4 10*3/uL (ref 4.0–10.5)

## 2015-11-28 LAB — TSH: TSH: 1.69 u[IU]/mL (ref 0.35–4.50)

## 2015-11-28 LAB — LDL CHOLESTEROL, DIRECT: Direct LDL: 98 mg/dL

## 2015-11-29 ENCOUNTER — Other Ambulatory Visit: Payer: Self-pay | Admitting: Family Medicine

## 2015-11-29 DIAGNOSIS — E875 Hyperkalemia: Secondary | ICD-10-CM

## 2015-12-01 LAB — VITAMIN B1: Vitamin B1 (Thiamine): 32 nmol/L — ABNORMAL HIGH (ref 8–30)

## 2015-12-01 NOTE — Assessment & Plan Note (Addendum)
hgba1c acceptable, minimize simple carbs. Increase exercise as tolerated. Continue current meds. Will increase Lantus by 2 units

## 2015-12-01 NOTE — Progress Notes (Signed)
Patient ID: TYKIA MELLONE, female   DOB: [birth year], 82 y.o.   MRN: [id number]   Subjective:    Patient ID: [name], female    DOB: 11/19/1931, 82 y.o.   MRN: [id number]  Chief Complaint  Patient presents with  . Follow-up    HPI Patient is in today for follow up. She is accompanied by her son. No recent illness or acute concerns. No hospitalizations. Doing well with ADLs. Denies CP/palp/SOB/HA/congestion/fevers/GI or GU c/o. Taking meds as prescribed  Past Medical History:  Diagnosis Date  . Acute renal insufficiency 12/24/2013  . Amaurosis fugax   . Arthritis 12/06/2012  . Atherosclerosis   . CAD (coronary artery disease)    a. s/p CABG x 4 (LIMA->LAD, VG->Diag, VG->OM1->OM3);  b. 2010 Cath: 3/4 patent grafts (VG->diag occluded), 3vd, sev PVD ->Med Rx;  c. 12/2011 Lexi MV: sm area of mild mid and apical ant wall ischemia ->med rx.  . Carotid bruit   . Chronic kidney disease (CKD) stage G4/A1, severely decreased glomerular filtration rate (GFR) between 15-29 mL/min/1.73 square meter and albuminuria creatinine ratio less than 30 mg/g (HCC)    GFR 25 on 12/25/13  . CVA (cerebral infarction)   . Dementia    pt denies  . Depression 12/24/2013  . Diabetes mellitus type II    insulin dependent.   . Diabetic peripheral neuropathy ([place])   . Diabetic retinopathy   . Diverticulosis   . Duodenal ulcer 2009 and 11/2011   caused GI bleeding  . Gallstones 2009   seen on CT scan  . GI bleed 04/2011, 11/2011   found non-bleeding duodenal ulcers  . Hyperlipidemia   . Hypersomnolence 03/12/2013  . Hypertension   . Iron deficiency anemia secondary to blood loss (chronic)   . Melena 1015/2015   PEPTIC ULCER    REQUIRING TRANSFUSION  . PVD (peripheral vascular disease) ([place])   . Rib fracture 04/21/2015  . Thiamine deficiency 08/13/2015  . UTI (urinary tract infection) 04/08/2014  . Weight loss, non-intentional 01/29/2014    Past Surgical History:  Procedure Laterality Date  .  ABDOMINAL HYSTERECTOMY    . COLONOSCOPY  05/20/2011   Procedure: COLONOSCOPY;  Surgeon: Scarlette Shorts, MD;  Location: [place];  Service: Endoscopy;  Laterality: N/A;  . CORONARY ARTERY BYPASS GRAFT  1999   x 4  . ENTEROSCOPY N/A 12/29/2013   Procedure: ENTEROSCOPY;  Surgeon: Inda Castle, MD;  Location: [place];  Service: Endoscopy;  Laterality: N/A;  . ESOPHAGOGASTRODUODENOSCOPY  05/16/2011   Procedure: ESOPHAGOGASTRODUODENOSCOPY (EGD);  Surgeon: Gatha Mayer, MD;  [facility] ENDOSCOPY;  Service: Endoscopy;  Laterality: N/A;  . ESOPHAGOGASTRODUODENOSCOPY  12/08/2011   Procedure: ESOPHAGOGASTRODUODENOSCOPY (EGD);  Surgeon: Ladene Artist, MD,FACG;  Location: [facility] ENDOSCOPY;  Service: Endoscopy;  Laterality: N/A;  . UPPER GASTROINTESTINAL ENDOSCOPY  02/22/2008   w/biopsy, duedenal ulcer, antrum erosions    Family History  Problem Relation Age of Onset  . Coronary artery disease Father   . Hypertension Father   . Stroke Father   . Cancer Mother     ?  . Diabetes Maternal Uncle   . Colon cancer Neg Hx     Social History   Social History  . Marital status: Widowed    Spouse name: N/A  . Number of children: 4  . Years of education: N/A   Occupational History  . retired Retired   Social History Main Topics  . Smoking status: Former Smoker    Types: Cigarettes  Quit date: 12/31/2000  . Smokeless tobacco: Never Used  . Alcohol use No  . Drug use: No  . Sexual activity: Not on file   Other Topics Concern  . Not on file   Social History Narrative   Widowed - husband passed away in Apr 29, 2009   lives with daughter with epilepsy - mental retardation   Retired     Former Smoker      Alcohol use-no          Occupation: Retired       son - Kewana Sanon     Outpatient Medications Prior to Visit  Medication Sig Dispense Refill  . amLODipine (NORVASC) 10 MG tablet TAKE 1 TABLET(10 MG) BY MOUTH DAILY 30 tablet 0  . atorvastatin (LIPITOR) 20 MG tablet Take 20 mg by  mouth daily.    . Cholecalciferol (VITAMIN D) 1000 UNITS capsule Take 1,000 Units by mouth every evening.     . citalopram (CELEXA) 20 MG tablet TAKE 1 TABLET(20 MG) BY MOUTH DAILY 90 tablet 0  . clopidogrel (PLAVIX) 75 MG tablet TAKE 1 TABLET(75 MG) BY MOUTH DAILY 90 tablet 0  . donepezil (ARICEPT) 10 MG tablet TAKE 1 TABLET(10 MG) BY MOUTH AT BEDTIME 90 tablet 0  . Ferrous Sulfate (IRON) 325 (65 FE) MG TABS Take 325 mg by mouth 2 (two) times daily.     . furosemide (LASIX) 20 MG tablet TAKE 1 TABLET (20 MG TOTAL) BY MOUTH DAILY. (Patient taking differently: Not taking at this time) 90 tablet 1  . HEMATINIC/FOLIC ACID 062-6 MG TABS TAKE 1 TABLET BY MOUTH DAILY 30 each 3  . insulin lispro (HUMALOG KWIKPEN) 100 UNIT/ML KiwkPen Inject 0.05-0.1 mLs (5-10 Units total) into the skin 3 (three) times daily with meals. 15 mL 4  . Insulin Pen Needle (B-D ULTRAFINE III SHORT PEN) 31G X 8 MM MISC Use as directed for insulin 100 each 6  . isosorbide mononitrate (IMDUR) 60 MG 24 hr tablet Take 1 tablet (60 mg total) by mouth daily. 90 tablet 2  . nitroGLYCERIN (NITROSTAT) 0.4 MG SL tablet Place 0.4 mg under the tongue every 5 (five) minutes as needed for chest pain.    . Omega-3 Fatty Acids (FISH OIL) 1200 MG CAPS Take 1,200 mg by mouth daily.     . pantoprazole (PROTONIX) 40 MG tablet Take 1 tablet every Monday, Wednesday and Friday. 30 tablet 5  . ranitidine (ZANTAC) 150 MG tablet Take 150 mg by mouth at bedtime.    . thiamine (VITAMIN B-1) 100 MG tablet TAKE 1 TABLET(100 MG) BY MOUTH DAILY 30 tablet 0  . traMADol-acetaminophen (ULTRACET) 37.5-325 MG tablet Take 1 tablet by mouth every 6 (six) hours as needed for moderate pain or severe pain.    Marland Kitchen triamterene-hydrochlorothiazide (MAXZIDE-25) 37.5-25 MG tablet Take 1 tablet by mouth daily. 30 tablet 3  . Insulin Glargine (LANTUS SOLOSTAR) 100 UNIT/ML Solostar Pen Inject 26 Units into the skin daily at 10 pm. Increase by 2 units every 3 days if all fsg>120 15  mL 4   No facility-administered medications prior to visit.     No Known Allergies  Review of Systems  Constitutional: Positive for malaise/fatigue. Negative for fever.  HENT: Negative for congestion.   Eyes: Negative for blurred vision.  Respiratory: Negative for shortness of breath.   Cardiovascular: Negative for chest pain, palpitations and leg swelling.  Gastrointestinal: Negative for abdominal pain, blood in stool and nausea.  Genitourinary: Negative for dysuria and frequency.  Musculoskeletal: Negative for falls.  Skin: Negative for rash.  Neurological: Negative for dizziness, loss of consciousness and headaches.  Endo/Heme/Allergies: Negative for environmental allergies.  Psychiatric/Behavioral: Negative for depression. The patient is not nervous/anxious.        Objective:    Physical Exam  Constitutional: She is oriented to person, place, and time. She appears well-developed and well-nourished. No distress.  HENT:  Head: Normocephalic and atraumatic.  Nose: Nose normal.  Eyes: Right eye exhibits no discharge. Left eye exhibits no discharge.  Neck: Normal range of motion. Neck supple.  Cardiovascular: Normal rate and regular rhythm.   Murmur heard. Pulmonary/Chest: Effort normal and breath sounds normal.  Abdominal: Soft. Bowel sounds are normal. There is no tenderness.  Musculoskeletal: She exhibits no edema.  Neurological: She is alert and oriented to person, place, and time.  Skin: Skin is warm and dry.  Psychiatric: She has a normal mood and affect.  Nursing note and vitals reviewed.   BP (!) 144/68   Pulse (!) 52   SpO2 99%  Wt Readings from Last 3 Encounters:  08/13/15 164 lb 2 oz (74.4 kg)  05/27/15 157 lb (71.2 kg)  04/19/15 160 lb 6.4 oz (72.8 kg)     Lab Results  Component Value Date   WBC 8.4 11/26/2015   HGB 12.1 11/26/2015   HCT 35.0 (L) 11/26/2015   PLT 195.0 11/26/2015   GLUCOSE 210 (H) 11/26/2015   CHOL 181 11/26/2015   TRIG 385.0  (H) 11/26/2015   HDL 39.70 11/26/2015   LDLDIRECT 98.0 11/26/2015   LDLCALC 129 (H) 12/31/2014   ALT 12 11/26/2015   AST 13 11/26/2015   NA 142 11/26/2015   K 5.2 (H) 11/26/2015   CL 105 11/26/2015   CREATININE 2.15 (H) 11/26/2015   BUN 41 (H) 11/26/2015   CO2 29 11/26/2015   TSH 1.69 11/27/2015   INR 1.27 12/28/2013   HGBA1C 8.3 (H) 11/26/2015   MICROALBUR 97.4 (H) 11/26/2015    Lab Results  Component Value Date   TSH 1.69 11/27/2015   Lab Results  Component Value Date   WBC 8.4 11/26/2015   HGB 12.1 11/26/2015   HCT 35.0 (L) 11/26/2015   MCV 91.4 11/26/2015   PLT 195.0 11/26/2015   Lab Results  Component Value Date   NA 142 11/26/2015   K 5.2 (H) 11/26/2015   CO2 29 11/26/2015   GLUCOSE 210 (H) 11/26/2015   BUN 41 (H) 11/26/2015   CREATININE 2.15 (H) 11/26/2015   BILITOT 0.7 11/26/2015   ALKPHOS 75 11/26/2015   AST 13 11/26/2015   ALT 12 11/26/2015   PROT 6.9 11/26/2015   ALBUMIN 3.6 11/26/2015   CALCIUM 9.3 11/26/2015   ANIONGAP 12 12/29/2013   GFR 23.29 (L) 11/26/2015   Lab Results  Component Value Date   CHOL 181 11/26/2015   Lab Results  Component Value Date   HDL 39.70 11/26/2015   Lab Results  Component Value Date   LDLCALC 129 (H) 12/31/2014   Lab Results  Component Value Date   TRIG 385.0 (H) 11/26/2015   Lab Results  Component Value Date   CHOLHDL 5 11/26/2015   Lab Results  Component Value Date   HGBA1C 8.3 (H) 11/26/2015       Assessment & Plan:   Problem List Items Addressed This Visit    Diabetes mellitus type 2, insulin dependent ([place]) (Chronic)    hgba1c acceptable, minimize simple carbs. Increase exercise as tolerated. Continue current meds. Will increase  Lantus by 2 units      Relevant Medications   Insulin Glargine (LANTUS SOLOSTAR) 100 UNIT/ML Solostar Pen   Other Relevant Orders   Hemoglobin A1c (Completed)   Microalbumin / creatinine urine ratio (Completed)   CBC w/Diff   HgB A1c   B12   Comp Met (CMET)    Lipid panel   TSH (Completed)   Vitamin B1 (Completed)   CKD (chronic kidney disease) stage 4, GFR 15-29 ml/min (HCC) (Chronic)    Stable and established with nephrology.      Overweight (BMI 25.0-29.9) (Chronic)   Relevant Orders   CBC w/Diff   HgB A1c   B12   Comp Met (CMET)   Lipid panel   TSH (Completed)   Vitamin B1 (Completed)   Hyperlipidemia    Tolerating statin, encouraged heart healthy diet, avoid trans fats, minimize simple carbs and saturated fats. Increase exercise as tolerated      Relevant Orders   Lipid panel (Completed)   CBC w/Diff   HgB A1c   B12   Comp Met (CMET)   Lipid panel   TSH (Completed)   Vitamin B1 (Completed)   Essential hypertension     Encouraged heart healthy diet such as the DASH diet and exercise as tolerated.       Relevant Orders   CBC (Completed)   Comprehensive metabolic panel (Completed)   CBC w/Diff   HgB A1c   B12   Comp Met (CMET)   Lipid panel   TSH (Completed)   Vitamin B1 (Completed)   B12 deficiency   Relevant Orders   Vitamin B12 (Completed)   CBC w/Diff   HgB A1c   B12   Comp Met (CMET)   Lipid panel   TSH (Completed)   Vitamin B1 (Completed)   Anemia - Primary    Increase leafy greens, consider increased lean red meat and using cast iron cookware. Continue to monitor, report any concerns      Relevant Orders   CBC w/Diff   HgB A1c   B12   Comp Met (CMET)   Lipid panel   TSH (Completed)   Vitamin B1 (Completed)   Thiamine deficiency   Relevant Orders   Vitamin B1   CBC w/Diff   HgB A1c   B12   Comp Met (CMET)   Lipid panel   TSH (Completed)   Vitamin B1 (Completed)    Other Visit Diagnoses    Encounter for immunization       Relevant Orders   Flu vaccine HIGH DOSE PF (Completed)      I have changed Ms. Sturdy's Insulin Glargine. I am also having her maintain her Iron, Vitamin D, Fish Oil, nitroGLYCERIN, Insulin Pen Needle, atorvastatin, ranitidine, pantoprazole, isosorbide mononitrate,  furosemide, traMADol-acetaminophen, insulin lispro, triamterene-hydrochlorothiazide, HEMATINIC/FOLIC ACID, donepezil, clopidogrel, amLODipine, citalopram, and thiamine.  Meds ordered this encounter  Medications  . Insulin Glargine (LANTUS SOLOSTAR) 100 UNIT/ML Solostar Pen    Sig: Inject 32 Units into the skin daily at 10 pm. Increase by 2 units every 3 days if all fsg>120    Dispense:  15 mL    Refill:  4     BLYTH, STACEY, MD

## 2015-12-01 NOTE — Assessment & Plan Note (Addendum)
Encouraged heart healthy diet such as the DASH diet and exercise as tolerated.

## 2015-12-01 NOTE — Assessment & Plan Note (Signed)
Tolerating statin, encouraged heart healthy diet, avoid trans fats, minimize simple carbs and saturated fats. Increase exercise as tolerated 

## 2015-12-01 NOTE — Assessment & Plan Note (Signed)
Increase leafy greens, consider increased lean red meat and using cast iron cookware. Continue to monitor, report any concerns 

## 2015-12-01 NOTE — Assessment & Plan Note (Signed)
Stable and established with nephrology.

## 2015-12-06 ENCOUNTER — Other Ambulatory Visit (INDEPENDENT_AMBULATORY_CARE_PROVIDER_SITE_OTHER): Payer: Medicare Other

## 2015-12-06 DIAGNOSIS — E875 Hyperkalemia: Secondary | ICD-10-CM

## 2015-12-06 LAB — COMPREHENSIVE METABOLIC PANEL
ALK PHOS: 71 U/L (ref 39–117)
ALT: 12 U/L (ref 0–35)
AST: 11 U/L (ref 0–37)
Albumin: 3.4 g/dL — ABNORMAL LOW (ref 3.5–5.2)
BUN: 39 mg/dL — ABNORMAL HIGH (ref 6–23)
CHLORIDE: 107 meq/L (ref 96–112)
CO2: 27 meq/L (ref 19–32)
Calcium: 9.1 mg/dL (ref 8.4–10.5)
Creatinine, Ser: 1.79 mg/dL — ABNORMAL HIGH (ref 0.40–1.20)
GFR: 28.77 mL/min — AB (ref >60.00)
GLUCOSE: 195 mg/dL — AB (ref 70–99)
POTASSIUM: 4.5 meq/L (ref 3.5–5.1)
SODIUM: 141 meq/L (ref 135–145)
TOTAL PROTEIN: 6.8 g/dL (ref 6.0–8.3)
Total Bilirubin: 0.7 mg/dL (ref 0.2–1.2)

## 2015-12-09 ENCOUNTER — Encounter: Payer: Self-pay | Admitting: Family Medicine

## 2015-12-10 ENCOUNTER — Other Ambulatory Visit: Payer: Self-pay | Admitting: Family Medicine

## 2015-12-10 MED ORDER — VITAMIN B-1 100 MG PO TABS: 100.0000 mg | ORAL_TABLET | Freq: Every day | ORAL | 1 refills | Status: DC

## 2015-12-14 ENCOUNTER — Other Ambulatory Visit: Payer: Self-pay | Admitting: Family Medicine

## 2016-01-04 ENCOUNTER — Other Ambulatory Visit: Payer: Self-pay | Admitting: Family Medicine

## 2016-01-06 ENCOUNTER — Other Ambulatory Visit: Payer: Self-pay | Admitting: Family Medicine

## 2016-01-06 MED ORDER — PANTOPRAZOLE SODIUM 40 MG PO TBEC: DELAYED_RELEASE_TABLET | ORAL | 5 refills | Status: DC

## 2016-01-17 ENCOUNTER — Other Ambulatory Visit: Payer: Self-pay | Admitting: Family Medicine

## 2016-01-31 ENCOUNTER — Encounter: Payer: Self-pay | Admitting: Medical

## 2016-01-31 ENCOUNTER — Ambulatory Visit (INDEPENDENT_AMBULATORY_CARE_PROVIDER_SITE_OTHER): Payer: Medicare Other | Admitting: Medical

## 2016-01-31 ENCOUNTER — Ambulatory Visit (HOSPITAL_BASED_OUTPATIENT_CLINIC_OR_DEPARTMENT_OTHER)
Admission: RE | Admit: 2016-01-31 | Discharge: 2016-01-31 | Disposition: A | Discharge status: Home / Self Care | Payer: Medicare Other | Source: Ambulatory Visit | Attending: Medical | Admitting: Medical

## 2016-01-31 VITALS — BP 130/78 | Temp 97.7°F | Ht 66.0 in | Wt 160.0 lb

## 2016-01-31 DIAGNOSIS — M25522 Pain in left elbow: Secondary | ICD-10-CM | POA: Insufficient documentation

## 2016-01-31 DIAGNOSIS — I7090 Unspecified atherosclerosis: Secondary | ICD-10-CM | POA: Insufficient documentation

## 2016-01-31 DIAGNOSIS — R5383 Other fatigue: Secondary | ICD-10-CM | POA: Diagnosis not present

## 2016-01-31 DIAGNOSIS — R42 Dizziness and giddiness: Secondary | ICD-10-CM

## 2016-01-31 DIAGNOSIS — R0600 Dyspnea, unspecified: Secondary | ICD-10-CM | POA: Diagnosis not present

## 2016-01-31 DIAGNOSIS — R1013 Epigastric pain: Secondary | ICD-10-CM | POA: Diagnosis not present

## 2016-01-31 DIAGNOSIS — Z87440 Personal history of urinary (tract) infections: Secondary | ICD-10-CM | POA: Diagnosis not present

## 2016-01-31 DIAGNOSIS — I517 Cardiomegaly: Secondary | ICD-10-CM | POA: Diagnosis not present

## 2016-01-31 DIAGNOSIS — Z9181 History of falling: Secondary | ICD-10-CM

## 2016-01-31 DIAGNOSIS — R630 Anorexia: Secondary | ICD-10-CM

## 2016-01-31 LAB — AMYLASE: AMYLASE: 66 U/L (ref 27–131)

## 2016-01-31 LAB — COMPREHENSIVE METABOLIC PANEL
ALBUMIN: 3.7 g/dL (ref 3.5–5.2)
ALK PHOS: 79 U/L (ref 39–117)
ALT: 10 U/L (ref 0–35)
AST: 12 U/L (ref 0–37)
BILIRUBIN TOTAL: 0.9 mg/dL (ref 0.2–1.2)
BUN: 54 mg/dL — AB (ref 6–23)
CO2: 28 mEq/L (ref 19–32)
Calcium: 9.2 mg/dL (ref 8.4–10.5)
Chloride: 100 mEq/L (ref 96–112)
Creatinine, Ser: 2.11 mg/dL — ABNORMAL HIGH (ref 0.40–1.20)
GFR: 23.79 mL/min — AB (ref >60.00)
GLUCOSE: 175 mg/dL — AB (ref 70–99)
Potassium: 3.5 mEq/L (ref 3.5–5.1)
Sodium: 137 mEq/L (ref 135–145)
TOTAL PROTEIN: 7 g/dL (ref 6.0–8.3)

## 2016-01-31 LAB — CBC WITH DIFFERENTIAL/PLATELET
BASOS ABS: 0 10*3/uL (ref 0.0–0.1)
Basophils Relative: 0.5 % (ref 0.0–3.0)
EOS PCT: 2 % (ref 0.0–5.0)
Eosinophils Absolute: 0.2 10*3/uL (ref 0.0–0.7)
HEMATOCRIT: 39.4 % (ref 36.0–46.0)
HEMOGLOBIN: 13.4 g/dL (ref 12.0–15.0)
LYMPHS ABS: 2.1 10*3/uL (ref 0.7–4.0)
LYMPHS PCT: 22 % (ref 12.0–46.0)
MCHC: 34.1 g/dL (ref 30.0–36.0)
MCV: 92.3 fl (ref 78.0–100.0)
MONOS PCT: 9 % (ref 3.0–12.0)
Monocytes Absolute: 0.8 10*3/uL (ref 0.1–1.0)
NEUTROS PCT: 66.5 % (ref 43.0–77.0)
Neutro Abs: 6.2 10*3/uL (ref 1.4–7.7)
Platelets: 224 10*3/uL (ref 150.0–400.0)
RBC: 4.27 Mil/uL (ref 3.87–5.11)
RDW: 13.5 % (ref 11.5–15.5)
WBC: 9.4 10*3/uL (ref 4.0–10.5)

## 2016-01-31 LAB — POCT URINALYSIS DIPSTICK
Bilirubin, UA: NEGATIVE
GLUCOSE UA: NEGATIVE
Ketones, UA: NEGATIVE
NITRITE UA: NEGATIVE
SPEC GRAV UA: 1.025
UROBILINOGEN UA: 0.2
pH, UA: 6

## 2016-01-31 LAB — BRAIN NATRIURETIC PEPTIDE: PRO B NATRI PEPTIDE: 58 pg/mL (ref 0.0–100.0)

## 2016-01-31 LAB — LIPASE: LIPASE: 95 U/L — AB (ref 11.0–59.0)

## 2016-01-31 LAB — TROPONIN I: TNIDX: 0.02 ug/l (ref 0.00–0.06)

## 2016-01-31 MED ORDER — PANTOPRAZOLE SODIUM 40 MG PO TBEC: 40.0000 mg | DELAYED_RELEASE_TABLET | Freq: Every day | ORAL | 1 refills | Status: DC

## 2016-01-31 NOTE — Progress Notes (Signed)
Subjective:    Patient ID: Destiny [name], female    DOB: 07/18/1931, 82 y.o.   MRN: [id number]  HPI   Pt in with family.   Last 2 weeks she has not been eating very much. Not drinking much. She has been drinking some boost 2-3 times a day last 2 days.   Caretaker usually does cook eggs and toast. And will eat lunch. But after lunch caretaker and family don't visit. Then it appears she has not been eating dinner.  Pt family wants walker and other durable med devices as indicated.  Pt has fallen twice in past week. Fell on hard wood floor. No head trauma. No loc. Pt fell this am after getting up quicklly. Second time today. Hit her rt elbow.   Pt has been dizziness on and off. Mostly position changes. No spinning.No ha's. No gross motor or sensory function deficits. No dizziness on exam today.  Pt takes protonix in past Monday, wed and Friday. Recently over weekend episodic abdomen pain. Hx of GI bleed years ago. Now using protonix daily. NO black or blood stools.  Some dyspnea. Non smoker presently. She stopped smoking years ago. Stopped smoking 30 years ago. But states when smoked did smoke a lot.  Hx of uti in the past.      Review of Systems  HENT: Negative for congestion, drooling, ear pain, facial swelling, nosebleeds and postnasal drip.   Respiratory: Positive for shortness of breath. Negative for cough and wheezing.        On ambulation.  Cardiovascular: Negative for chest pain and palpitations.  Gastrointestinal: Negative for abdominal distention, abdominal pain, anal bleeding, blood in stool, constipation, diarrhea, nausea, rectal pain and vomiting.       Inttermiiten abdomen pain. Non now.  No bright red blood or black stools.  Neurological: Positive for dizziness. Negative for syncope, facial asymmetry, speech difficulty, weakness, light-headedness, numbness and headaches.       Not not but on position change. Transient.   Past Medical History:  Diagnosis Date    . Acute renal insufficiency 12/24/2013  . Amaurosis fugax   . Arthritis 12/06/2012  . Atherosclerosis   . CAD (coronary artery disease)    a. s/p CABG x 4 (LIMA->LAD, VG->Diag, VG->OM1->OM3);  b. 2010 Cath: 3/4 patent grafts (VG->diag occluded), 3vd, sev PVD ->Med Rx;  c. 12/2011 Lexi MV: sm area of mild mid and apical ant wall ischemia ->med rx.  . Carotid bruit   . Chronic kidney disease (CKD) stage G4/A1, severely decreased glomerular filtration rate (GFR) between 15-29 mL/min/1.73 square meter and albuminuria creatinine ratio less than 30 mg/g (HCC)    GFR 25 on 12/25/13  . CVA (cerebral infarction)   . Dementia    pt denies  . Depression 12/24/2013  . Diabetes mellitus type II    insulin dependent.   . Diabetic peripheral neuropathy ([place])   . Diabetic retinopathy   . Diverticulosis   . Duodenal ulcer 2009 and 11/2011   caused GI bleeding  . Gallstones 2009   seen on CT scan  . GI bleed 04/2011, 11/2011   found non-bleeding duodenal ulcers  . Hyperlipidemia   . Hypersomnolence 03/12/2013  . Hypertension   . Iron deficiency anemia secondary to blood loss (chronic)   . Melena 1015/2015   PEPTIC ULCER    REQUIRING TRANSFUSION  . PVD (peripheral vascular disease) ([place])   . Rib fracture 04/21/2015  . Thiamine deficiency 08/13/2015  . UTI (urinary tract infection)  04/08/2014  . Weight loss, non-intentional 01/29/2014     Social History   Social History  . Marital status: Widowed    Spouse name: N/A  . Number of children: 4  . Years of education: N/A   Occupational History  . retired Retired   Social History Main Topics  . Smoking status: Former Smoker    Types: Cigarettes    Quit date: 12/31/2000  . Smokeless tobacco: Never Used  . Alcohol use No  . Drug use: No  . Sexual activity: Not on file   Other Topics Concern  . Not on file   Social History Narrative   Widowed - husband passed away in 05/23/09   lives with daughter with epilepsy - mental retardation    Retired     Former Smoker      Alcohol use-no          Occupation: Retired       son - Vianny Schraeder     Past Surgical History:  Procedure Laterality Date  . ABDOMINAL HYSTERECTOMY    . COLONOSCOPY  05/20/2011   Procedure: COLONOSCOPY;  Surgeon: Scarlette Shorts, MD;  Location: [place];  Service: Endoscopy;  Laterality: N/A;  . CORONARY ARTERY BYPASS GRAFT  1999   x 4  . ENTEROSCOPY N/A 12/29/2013   Procedure: ENTEROSCOPY;  Surgeon: Inda Castle, MD;  Location: [place];  Service: Endoscopy;  Laterality: N/A;  . ESOPHAGOGASTRODUODENOSCOPY  05/16/2011   Procedure: ESOPHAGOGASTRODUODENOSCOPY (EGD);  Surgeon: Gatha Mayer, MD;  Location[facility] ENDOSCOPY;  Service: Endoscopy;  Laterality: N/A;  . ESOPHAGOGASTRODUODENOSCOPY  12/08/2011   Procedure: ESOPHAGOGASTRODUODENOSCOPY (EGD);  Surgeon: Ladene Artist, MD,FACG;  [facility] ENDOSCOPY;  Service: Endoscopy;  Laterality: N/A;  . UPPER GASTROINTESTINAL ENDOSCOPY  02/22/2008   w/biopsy, duedenal ulcer, antrum erosions    Family History  Problem Relation Age of Onset  . Coronary artery disease Father   . Hypertension Father   . Stroke Father   . Cancer Mother     ?  . Diabetes Maternal Uncle   . Colon cancer Neg Hx     No Known Allergies  Current Outpatient Prescriptions on File Prior to Visit  Medication Sig Dispense Refill  . amLODipine (NORVASC) 10 MG tablet TAKE 1 TABLET(10 MG) BY MOUTH DAILY 30 tablet 5  . atorvastatin (LIPITOR) 20 MG tablet Take 20 mg by mouth daily.    . Cholecalciferol (VITAMIN D) 1000 UNITS capsule Take 1,000 Units by mouth every evening.     . citalopram (CELEXA) 20 MG tablet TAKE 1 TABLET(20 MG) BY MOUTH DAILY 90 tablet 0  . clopidogrel (PLAVIX) 75 MG tablet TAKE 1 TABLET(75 MG) BY MOUTH DAILY 90 tablet 0  . donepezil (ARICEPT) 10 MG tablet TAKE 1 TABLET(10 MG) BY MOUTH AT BEDTIME 90 tablet 0  . Ferrous Sulfate (IRON) 325 (65 FE) MG TABS Take 325 mg by mouth 2 (two) times daily.     . furosemide  (LASIX) 20 MG tablet TAKE 1 TABLET (20 MG TOTAL) BY MOUTH DAILY. (Patient taking differently: Not taking at this time) 90 tablet 1  . HEMATINIC/FOLIC ACID 341-9 MG TABS TAKE 1 TABLET BY MOUTH DAILY 30 each 2  . Insulin Glargine (LANTUS SOLOSTAR) 100 UNIT/ML Solostar Pen Inject 32 Units into the skin daily at 10 pm. Increase by 2 units every 3 days if all fsg>120 15 mL 4  . insulin lispro (HUMALOG KWIKPEN) 100 UNIT/ML KiwkPen Inject 0.05-0.1 mLs (5-10 Units total) into the skin  3 (three) times daily with meals. 15 mL 4  . Insulin Pen Needle (B-D ULTRAFINE III SHORT PEN) 31G X 8 MM MISC Use as directed for insulin 100 each 6  . isosorbide mononitrate (IMDUR) 60 MG 24 hr tablet TAKE 1 TABLET BY MOUTH EVERY DAY 90 tablet 0  . nitroGLYCERIN (NITROSTAT) 0.4 MG SL tablet Place 0.4 mg under the tongue every 5 (five) minutes as needed for chest pain.    . Omega-3 Fatty Acids (FISH OIL) 1200 MG CAPS Take 1,200 mg by mouth daily.     . pantoprazole (PROTONIX) 40 MG tablet Take 1 tablet every Monday, Wednesday and Friday. (Patient taking differently: daily. Take 1 tablet every Monday, Wednesday and Friday.) 30 tablet 5  . ranitidine (ZANTAC) 150 MG tablet Take 150 mg by mouth at bedtime.    . thiamine (VITAMIN B-1) 100 MG tablet Take 1 tablet (100 mg total) by mouth daily. 30 tablet 1  . traMADol-acetaminophen (ULTRACET) 37.5-325 MG tablet Take 1 tablet by mouth every 6 (six) hours as needed for moderate pain or severe pain.    Marland Kitchen triamterene-hydrochlorothiazide (MAXZIDE-25) 37.5-25 MG tablet TAKE 1 TABLET BY MOUTH DAILY 30 tablet 5   No current facility-administered medications on file prior to visit.     BP 130/78 (BP Location: Left Arm, Patient Position: Sitting)   Temp 97.7 F (36.5 C) (Oral)   Ht 5\' 6"  (1.676 m)   Wt 160 lb (72.6 kg)   SpO2 99%   BMI 25.82 kg/m       Objective:   Physical Exam  General Mental Status- Alert. General Appearance- Not in acute distress.   Skin General: Color-  Normal Color. Moisture- Normal Moisture.  Neck Carotid Arteries- Normal color. Moisture- Normal Moisture. No carotid bruits. No JVD.  Chest and Lung Exam Auscultation: Breath Sounds:-Normal.  Cardiovascular Auscultation:Rythm- Regular. Murmurs & Other Heart Sounds:Auscultation of the heart reveals- No Murmurs.  Abdomen Inspection:-Inspeection Normal. Palpation/Percussion:Note:No mass. Palpation and Percussion of the abdomen reveal- Non Tender, Non Distended + BS, no rebound or guarding.  Abdomen- soft, non-tender, nondistended, +bs. No rebound or guarding. No organomegaly.   Back- no cva tenderness.   Neurologic Cranial Nerve exam:- CN III-XII intact(No nystagmus), symmetric smile. Drift Test:- No drift. Finger to Nose:- Normal/Intact Strength:- 5/5 equal and symmetric strength both upper and lower extremities.  Lt elbow- mild bruise. Good rom. Faint pain on palpation at best.  Lower ext- no calf swelling, symmetric calves. Neg homans signs.    Assessment & Plan:  Will get stat cbc, cmp, bnp, amylase, and lipase today. Get ua and get culture as well.  For mild dyspea on exertion this week will get cxr and troponin.  For dizziness will evaluate labs and advise getting balance before ambulating. Rx for a walker. If severe dizziness or gross motor or sensory function deficits then ED evalution. Will get PT to evaluate gait as well.  Will refer to OT to evaluate home for other durable medical devices she may need. Refer homehealth.  Xray of left elbow.  Advise 3-5 boost or ensure a day. Can also supplement diet with [employer]el fitness water. Family will visit her at night and cook evening meal.  Rx protonix daily.  Follow up next Tuesday or as needed  Hand written walker given today.  ([id number]. [phone]

## 2016-01-31 NOTE — Patient Instructions (Addendum)
Will get stat cbc, cmp, bnp, amylase, and lipase today. Get ua and get culture as well.  For mild dyspea on exertion this week will get cxr and troponin.  For dizziness will evaluate labs and advise getting balance before ambulating. Rx for a walker. If severe dizziness or gross motor or sensory function deficits then ED evalution. Will get PT to evaluate gait as well.  Will refer to OT to evaluate home for other durable medical devices she may need. Refer homehealth.  Xray of left elbow.  Advise 3-5 boost or ensure a day. Can also supplement diet with propel fitness water. Family will visit her at night and cook evening meal.  Rx protonix daily.  Follow up next Tuesday or as needed Hand written walker given today.

## 2016-01-31 NOTE — Progress Notes (Signed)
Pre visit review using our clinic review tool, if applicable. No additional management support is needed unless otherwise documented below in the visit note. 

## 2016-02-02 ENCOUNTER — Telehealth: Payer: Self-pay | Admitting: Medical

## 2016-02-02 MED ORDER — CEPHALEXIN 250 MG PO CAPS: 250.0000 mg | ORAL_CAPSULE | Freq: Two times a day (BID) | ORAL | 0 refills | Status: DC

## 2016-02-02 NOTE — Telephone Encounter (Signed)
Sent in cephelexin. Low dose. Epic gave warning 500 mg excess dose. Pt kidney function very low gfr. Rx for uti.

## 2016-02-03 ENCOUNTER — Encounter: Payer: Self-pay | Admitting: Family Medicine

## 2016-02-03 DIAGNOSIS — R269 Unspecified abnormalities of gait and mobility: Secondary | ICD-10-CM

## 2016-02-03 LAB — URINE CULTURE

## 2016-02-04 ENCOUNTER — Ambulatory Visit (INDEPENDENT_AMBULATORY_CARE_PROVIDER_SITE_OTHER): Payer: Medicare Other | Admitting: Medical

## 2016-02-04 ENCOUNTER — Encounter: Payer: Self-pay | Admitting: Medical

## 2016-02-04 VITALS — BP 134/52 | HR 65 | Temp 98.1°F | Ht 66.0 in | Wt 162.0 lb

## 2016-02-04 DIAGNOSIS — R944 Abnormal results of kidney function studies: Secondary | ICD-10-CM

## 2016-02-04 DIAGNOSIS — R748 Abnormal levels of other serum enzymes: Secondary | ICD-10-CM

## 2016-02-04 DIAGNOSIS — R110 Nausea: Secondary | ICD-10-CM | POA: Diagnosis not present

## 2016-02-04 DIAGNOSIS — N390 Urinary tract infection, site not specified: Secondary | ICD-10-CM

## 2016-02-04 LAB — COMPREHENSIVE METABOLIC PANEL
ALBUMIN: 3.6 g/dL (ref 3.5–5.2)
ALK PHOS: 81 U/L (ref 39–117)
ALT: 16 U/L (ref 0–35)
AST: 19 U/L (ref 0–37)
BILIRUBIN TOTAL: 0.6 mg/dL (ref 0.2–1.2)
BUN: 44 mg/dL — AB (ref 6–23)
CO2: 24 mEq/L (ref 19–32)
CREATININE: 1.75 mg/dL — AB (ref 0.40–1.20)
Calcium: 9.6 mg/dL (ref 8.4–10.5)
Chloride: 106 mEq/L (ref 96–112)
GFR: 29.52 mL/min — ABNORMAL LOW (ref >60.00)
GLUCOSE: 184 mg/dL — AB (ref 70–99)
Potassium: 4.3 mEq/L (ref 3.5–5.1)
SODIUM: 142 meq/L (ref 135–145)
TOTAL PROTEIN: 6.9 g/dL (ref 6.0–8.3)

## 2016-02-04 LAB — LIPASE: Lipase: 85 U/L — ABNORMAL HIGH (ref 11.0–59.0)

## 2016-02-04 LAB — AMYLASE: AMYLASE: 63 U/L (ref 27–131)

## 2016-02-04 NOTE — Progress Notes (Signed)
Pre visit review using our clinic review tool, if applicable. No additional management support is needed unless otherwise documented below in the visit note. 

## 2016-02-04 NOTE — Patient Instructions (Addendum)
For your uti I want you to start cephalexin antibiotic.  Will try to get OT, PT, and homehealth to your house early next week.  Advise to get balance after standing before ambulating. Use the walker at least to get balance before ambulating. Remind again if dizziness with neurologic signs or symptoms then ED evaluation.  Please get cmp and lipase today.  Follow up wed early afternoon or as needed  Mikyla Schachter, [place], [place]

## 2016-02-04 NOTE — Progress Notes (Signed)
Subjective:    Patient ID: Destiny [name], female    DOB: 09/09/1931, 82 y.o.   MRN: [id number]  HPI  Pt for follow up. Over all she is feeling better.   Pt had uti on culture. I sent in the antibiotic the other day. Pt was unaware of med being sent. So I advised her today.Pt has not had fever, no chills. no dysuria and no uti symptoms. But has history of uti.  Pt had been fatigued and had less of appetite on last visit. But energy and appetite has improved slightly.   Still has mid transient dizziness with positions changes in the past and this persist.She fell again the other day. She got up on Monday after quickly standing, getting transietn light headed then ambulating.  No trauma. No pain arms. No LOC. Caretaker was there and immediately helped her get up.  Reviewed my last note today.  Pt has some mild nauseau in am briefly. No vomiting. After she eats no abdomen pain.  Pt has low gfr. Hx of low in past.  Pt is eating more overall. Pt is eating supper/evening meal.  Pt bp is not high.  No cardiac signs or symptoms noted.   Review of Systems  Constitutional: Positive for fatigue. Negative for chills and fever.  HENT: Negative for congestion, ear pain, postnasal drip, sinus pain and sinus pressure.   Respiratory: Negative for cough, shortness of breath and wheezing.   Cardiovascular: Negative for chest pain and palpitations.  Gastrointestinal: Positive for nausea. Negative for abdominal distention, abdominal pain, anal bleeding, blood in stool, constipation, diarrhea, rectal pain and vomiting.       Nausea briefly early in the am.  Musculoskeletal: Negative for back pain, gait problem, myalgias and neck pain.  Skin: Negative for rash.  Neurological: Positive for dizziness. Negative for facial asymmetry.       See hpi.  Hematological: Negative for adenopathy. Does not bruise/bleed easily.  Psychiatric/Behavioral: Negative for behavioral problems, confusion, dysphoric mood  and suicidal ideas. The patient is not nervous/anxious.     Past Medical History:  Diagnosis Date  . Acute renal insufficiency 12/24/2013  . Amaurosis fugax   . Arthritis 12/06/2012  . Atherosclerosis   . CAD (coronary artery disease)    a. s/p CABG x 4 (LIMA->LAD, VG->Diag, VG->OM1->OM3);  b. 2010 Cath: 3/4 patent grafts (VG->diag occluded), 3vd, sev PVD ->Med Rx;  c. 12/2011 Lexi MV: sm area of mild mid and apical ant wall ischemia ->med rx.  . Carotid bruit   . Chronic kidney disease (CKD) stage G4/A1, severely decreased glomerular filtration rate (GFR) between 15-29 mL/min/1.73 square meter and albuminuria creatinine ratio less than 30 mg/g (HCC)    GFR 25 on 12/25/13  . CVA (cerebral infarction)   . Dementia    pt denies  . Depression 12/24/2013  . Diabetes mellitus type II    insulin dependent.   . Diabetic peripheral neuropathy ([place])   . Diabetic retinopathy   . Diverticulosis   . Duodenal ulcer 2009 and 11/2011   caused GI bleeding  . Gallstones 2009   seen on CT scan  . GI bleed 04/2011, 11/2011   found non-bleeding duodenal ulcers  . Hyperlipidemia   . Hypersomnolence 03/12/2013  . Hypertension   . Iron deficiency anemia secondary to blood loss (chronic)   . Melena 1015/2015   PEPTIC ULCER    REQUIRING TRANSFUSION  . PVD (peripheral vascular disease) ([place])   . Rib fracture 04/21/2015  .  Thiamine deficiency 08/13/2015  . UTI (urinary tract infection) 04/08/2014  . Weight loss, non-intentional 01/29/2014     Social History   Social History  . Marital status: Widowed    Spouse name: N/A  . Number of children: 4  . Years of education: N/A   Occupational History  . retired Retired   Social History Main Topics  . Smoking status: Former Smoker    Types: Cigarettes    Quit date: 12/31/2000  . Smokeless tobacco: Never Used  . Alcohol use No  . Drug use: No  . Sexual activity: Not on file   Other Topics Concern  . Not on file   Social History Narrative    Widowed - husband passed away in May 30, 2009   lives with daughter with epilepsy - mental retardation   Retired     Former Smoker      Alcohol use-no          Occupation: Retired       son - Clover Feehan     Past Surgical History:  Procedure Laterality Date  . ABDOMINAL HYSTERECTOMY    . COLONOSCOPY  05/20/2011   Procedure: COLONOSCOPY;  Surgeon: Scarlette Shorts, MD;  Location: [place];  Service: Endoscopy;  Laterality: N/A;  . CORONARY ARTERY BYPASS GRAFT  1999   x 4  . ENTEROSCOPY N/A 12/29/2013   Procedure: ENTEROSCOPY;  Surgeon: Inda Castle, MD;  Location: [place];  Service: Endoscopy;  Laterality: N/A;  . ESOPHAGOGASTRODUODENOSCOPY  05/16/2011   Procedure: ESOPHAGOGASTRODUODENOSCOPY (EGD);  Surgeon: Gatha Mayer, MD;  Location: [facility] ENDOSCOPY;  Service: Endoscopy;  Laterality: N/A;  . ESOPHAGOGASTRODUODENOSCOPY  12/08/2011   Procedure: ESOPHAGOGASTRODUODENOSCOPY (EGD);  Surgeon: Ladene Artist, MD,FACG;  Location: [facility] ENDOSCOPY;  Service: Endoscopy;  Laterality: N/A;  . UPPER GASTROINTESTINAL ENDOSCOPY  02/22/2008   w/biopsy, duedenal ulcer, antrum erosions    Family History  Problem Relation Age of Onset  . Coronary artery disease Father   . Hypertension Father   . Stroke Father   . Cancer Mother     ?  . Diabetes Maternal Uncle   . Colon cancer Neg Hx     No Known Allergies  Current Outpatient Prescriptions on File Prior to Visit  Medication Sig Dispense Refill  . amLODipine (NORVASC) 10 MG tablet TAKE 1 TABLET(10 MG) BY MOUTH DAILY 30 tablet 5  . atorvastatin (LIPITOR) 20 MG tablet Take 20 mg by mouth daily.    . cephALEXin (KEFLEX) 250 MG capsule Take 1 capsule (250 mg total) by mouth 2 (two) times daily. 14 capsule 0  . Cholecalciferol (VITAMIN D) 1000 UNITS capsule Take 1,000 Units by mouth every evening.     . citalopram (CELEXA) 20 MG tablet TAKE 1 TABLET(20 MG) BY MOUTH DAILY 90 tablet 0  . clopidogrel (PLAVIX) 75 MG tablet TAKE 1 TABLET(75 MG) BY MOUTH  DAILY 90 tablet 0  . donepezil (ARICEPT) 10 MG tablet TAKE 1 TABLET(10 MG) BY MOUTH AT BEDTIME 90 tablet 0  . Ferrous Sulfate (IRON) 325 (65 FE) MG TABS Take 325 mg by mouth 2 (two) times daily.     . furosemide (LASIX) 20 MG tablet TAKE 1 TABLET (20 MG TOTAL) BY MOUTH DAILY. (Patient taking differently: Not taking at this time) 90 tablet 1  . HEMATINIC/FOLIC ACID 786-7 MG TABS TAKE 1 TABLET BY MOUTH DAILY 30 each 2  . Insulin Glargine (LANTUS SOLOSTAR) 100 UNIT/ML Solostar Pen Inject 32 Units into the skin daily at 10 pm.  Increase by 2 units every 3 days if all fsg>120 15 mL 4  . insulin lispro (HUMALOG KWIKPEN) 100 UNIT/ML KiwkPen Inject 0.05-0.1 mLs (5-10 Units total) into the skin 3 (three) times daily with meals. 15 mL 4  . Insulin Pen Needle (B-D ULTRAFINE III SHORT PEN) 31G X 8 MM MISC Use as directed for insulin 100 each 6  . isosorbide mononitrate (IMDUR) 60 MG 24 hr tablet TAKE 1 TABLET BY MOUTH EVERY DAY 90 tablet 0  . nitroGLYCERIN (NITROSTAT) 0.4 MG SL tablet Place 0.4 mg under the tongue every 5 (five) minutes as needed for chest pain.    . Omega-3 Fatty Acids (FISH OIL) 1200 MG CAPS Take 1,200 mg by mouth daily.     . pantoprazole (PROTONIX) 40 MG tablet Take 1 tablet every Monday, Wednesday and Friday. (Patient taking differently: daily. Take 1 tablet every Monday, Wednesday and Friday.) 30 tablet 5  . pantoprazole (PROTONIX) 40 MG tablet Take 1 tablet (40 mg total) by mouth daily. 30 tablet 1  . ranitidine (ZANTAC) 150 MG tablet Take 150 mg by mouth at bedtime.    . thiamine (VITAMIN B-1) 100 MG tablet Take 1 tablet (100 mg total) by mouth daily. 30 tablet 1  . traMADol-acetaminophen (ULTRACET) 37.5-325 MG tablet Take 1 tablet by mouth every 6 (six) hours as needed for moderate pain or severe pain.    Marland Kitchen triamterene-hydrochlorothiazide (MAXZIDE-25) 37.5-25 MG tablet TAKE 1 TABLET BY MOUTH DAILY 30 tablet 5   No current facility-administered medications on file prior to visit.      BP (!) 134/52   Pulse 65   Temp 98.1 F (36.7 C) (Oral)   Ht 5\' 6"  (1.676 m)   Wt 162 lb (73.5 kg)   SpO2 98%   BMI 26.15 kg/m       Objective:   Physical Exam Mental Status- Alert. General Appearance- Not in acute distress.   Skin General: Color- Normal Color. Moisture- Normal Moisture.  Neck Carotid Arteries- Normal color. Moisture- Normal Moisture. No carotid bruits. No JVD.  Chest and Lung Exam Auscultation: Breath Sounds:-Normal.  Cardiovascular Auscultation:Rythm- Regular. Murmurs & Other Heart Sounds:Auscultation of the heart reveals- No Murmurs.  Abdomen Inspection:-Inspeection Normal. Palpation/Percussion:Note:No mass. Palpation and Percussion of the abdomen reveal- Non Tender, Non Distended + BS, no rebound or guarding.  Abdomen- soft, non-tender, nondistended, +bs. No rebound or guarding. No organomegaly.   Back- no cva tenderness.   Neurologic Cranial Nerve exam:- CN III-XII intact(No nystagmus), symmetric smile. Strength:- 5/5 equal and symmetric strength both upper and lower extremities.  Upper ext- no tenderness to palpation. Lower ext- no pain on palpation of hips. And no pain on palpation lower extremities.       Assessment & Plan:  For your uti I want you to start cephalexin antibiotic.  Will try to get OT, PT, and homehealth to your house early next week.  Advise to get balance after standing before ambulating. Use the walker at least to get balance before ambulating.  Remind again if dizziness with neurologic signs or symptoms then ED evaluation.  Please get cmp and lipase today.  Follow up wed early afternoon or as needed

## 2016-02-07 NOTE — Telephone Encounter (Signed)
Anderson Malta. [facility] Want evaluation by skilled nursing, PT and OT. Can you get pt evaluated by this coming week.

## 2016-02-08 ENCOUNTER — Other Ambulatory Visit: Payer: Self-pay | Admitting: Family Medicine

## 2016-02-12 ENCOUNTER — Encounter: Payer: Self-pay | Admitting: Medical

## 2016-02-12 ENCOUNTER — Ambulatory Visit (INDEPENDENT_AMBULATORY_CARE_PROVIDER_SITE_OTHER): Payer: Medicare Other | Admitting: Medical

## 2016-02-12 VITALS — BP 100/58 | HR 59 | Temp 98.1°F | Ht 66.0 in | Wt 163.4 lb

## 2016-02-12 DIAGNOSIS — R269 Unspecified abnormalities of gait and mobility: Secondary | ICD-10-CM

## 2016-02-12 DIAGNOSIS — R5383 Other fatigue: Secondary | ICD-10-CM

## 2016-02-12 DIAGNOSIS — N390 Urinary tract infection, site not specified: Secondary | ICD-10-CM

## 2016-02-12 DIAGNOSIS — R748 Abnormal levels of other serum enzymes: Secondary | ICD-10-CM

## 2016-02-12 MED ORDER — INSULIN LISPRO 100 UNIT/ML (KWIKPEN): 5.0000 [IU] | PEN_INJECTOR | Freq: Three times a day (TID) | SUBCUTANEOUS | 4 refills | Status: DC

## 2016-02-12 MED ORDER — INSULIN GLARGINE 100 UNIT/ML SOLOSTAR PEN: 32.0000 [IU] | PEN_INJECTOR | Freq: Every day | SUBCUTANEOUS | 4 refills | Status: DC

## 2016-02-12 NOTE — Patient Instructions (Addendum)
You are clinically better from fatigue. Uti was likely the cause and you got better with antibiotic.  Will put in future lipase and amylase. Can get done in 2 wks or so when you see Dr. [name]. She may order other labs as well.  OT and PT home eval pending. Hopefully will be seen today.  Continue to eat as you have been doing. Again reminder to get balance on standing before amulating.  Follow up with pcp in 2 weeks or as needed.

## 2016-02-12 NOTE — Progress Notes (Signed)
Pre visit review using our clinic review tool, if applicable. No additional management support is needed unless otherwise documented below in the visit note. 

## 2016-02-12 NOTE — Progress Notes (Signed)
Subjective:    Patient ID: Destiny [name], female    DOB: 07-Sep-1931, 82 y.o.   MRN: [id number]  HPI  Pt in for follow up.   Pt did have Home Health came out yesterday. No feedback yet from them yet per pt and caretaker.  OT and PT scheduled to come today.   Pt balance is better. Pt is taking time getting balance before ambulating. No falls since I last saw her.  Pt appetite  is better. Eating more balanced diet. Family is coming over late evening to cook supper for her.  Pt energy is better. Pt took antibiotic for uti.  Pt pancrease enzyme mild elevated in the past recently. Bu no abd pain, no nausea and no vomiting.    Review of Systems  Constitutional: Negative for chills, fatigue and fever.  Respiratory: Negative for cough, chest tightness, shortness of breath and wheezing.   Cardiovascular: Negative for palpitations.  Gastrointestinal: Negative for abdominal pain, nausea and vomiting.  Musculoskeletal: Negative for back pain.  Skin: Negative for rash.  Neurological: Negative for dizziness, syncope, weakness, light-headedness and numbness.  Hematological: Negative for adenopathy. Does not bruise/bleed easily.  Psychiatric/Behavioral: Negative for behavioral problems and confusion.    Past Medical History:  Diagnosis Date  . Acute renal insufficiency 12/24/2013  . Amaurosis fugax   . Arthritis 12/06/2012  . Atherosclerosis   . CAD (coronary artery disease)    a. s/p CABG x 4 (LIMA->LAD, VG->Diag, VG->OM1->OM3);  b. 2010 Cath: 3/4 patent grafts (VG->diag occluded), 3vd, sev PVD ->Med Rx;  c. 12/2011 Lexi MV: sm area of mild mid and apical ant wall ischemia ->med rx.  . Carotid bruit   . Chronic kidney disease (CKD) stage G4/A1, severely decreased glomerular filtration rate (GFR) between 15-29 mL/min/1.73 square meter and albuminuria creatinine ratio less than 30 mg/g (HCC)    GFR 25 on 12/25/13  . CVA (cerebral infarction)   . Dementia    pt denies  . Depression  12/24/2013  . Diabetes mellitus type II    insulin dependent.   . Diabetic peripheral neuropathy ([place])   . Diabetic retinopathy   . Diverticulosis   . Duodenal ulcer 2009 and 11/2011   caused GI bleeding  . Gallstones 2009   seen on CT scan  . GI bleed 05/26/2011, 11/2011   found non-bleeding duodenal ulcers  . Hyperlipidemia   . Hypersomnolence 03/12/2013  . Hypertension   . Iron deficiency anemia secondary to blood loss (chronic)   . Melena 1015/2015   PEPTIC ULCER    REQUIRING TRANSFUSION  . PVD (peripheral vascular disease) ([place])   . Rib fracture 04/21/2015  . Thiamine deficiency 08/13/2015  . UTI (urinary tract infection) 04/08/2014  . Weight loss, non-intentional 01/29/2014     Social History   Social History  . Marital status: Widowed    Spouse name: N/A  . Number of children: 4  . Years of education: N/A   Occupational History  . retired Retired   Social History Main Topics  . Smoking status: Former Smoker    Types: Cigarettes    Quit date: 12/31/2000  . Smokeless tobacco: Never Used  . Alcohol use No  . Drug use: No  . Sexual activity: Not on file   Other Topics Concern  . Not on file   Social History Narrative   Widowed - husband passed away in 25-May-2009   lives with daughter with epilepsy - mental retardation   Retired  Former Smoker      Alcohol use-no          Occupation: Retired       son - Jewelle Whitner     Past Surgical History:  Procedure Laterality Date  . ABDOMINAL HYSTERECTOMY    . COLONOSCOPY  05/20/2011   Procedure: COLONOSCOPY;  Surgeon: Scarlette Shorts, MD;  Location: [place];  Service: Endoscopy;  Laterality: N/A;  . CORONARY ARTERY BYPASS GRAFT  1999   x 4  . ENTEROSCOPY N/A 12/29/2013   Procedure: ENTEROSCOPY;  Surgeon: Inda Castle, MD;  Location: [place];  Service: Endoscopy;  Laterality: N/A;  . ESOPHAGOGASTRODUODENOSCOPY  05/16/2011   Procedure: ESOPHAGOGASTRODUODENOSCOPY (EGD);  Surgeon: Gatha Mayer, MD;  Location: [facility]  ENDOSCOPY;  Service: Endoscopy;  Laterality: N/A;  . ESOPHAGOGASTRODUODENOSCOPY  12/08/2011   Procedure: ESOPHAGOGASTRODUODENOSCOPY (EGD);  Surgeon: Ladene Artist, MD,FACG;  Location[facility] ENDOSCOPY;  Service: Endoscopy;  Laterality: N/A;  . UPPER GASTROINTESTINAL ENDOSCOPY  02/22/2008   w/biopsy, duedenal ulcer, antrum erosions    Family History  Problem Relation Age of Onset  . Coronary artery disease Father   . Hypertension Father   . Stroke Father   . Cancer Mother     ?  . Diabetes Maternal Uncle   . Colon cancer Neg Hx     No Known Allergies  Current Outpatient Prescriptions on File Prior to Visit  Medication Sig Dispense Refill  . amLODipine (NORVASC) 10 MG tablet TAKE 1 TABLET(10 MG) BY MOUTH DAILY 30 tablet 5  . atorvastatin (LIPITOR) 20 MG tablet Take 20 mg by mouth daily.    . cephALEXin (KEFLEX) 250 MG capsule Take 1 capsule (250 mg total) by mouth 2 (two) times daily. 14 capsule 0  . Cholecalciferol (VITAMIN D) 1000 UNITS capsule Take 1,000 Units by mouth every evening.     . citalopram (CELEXA) 20 MG tablet TAKE 1 TABLET(20 MG) BY MOUTH DAILY 90 tablet 0  . clopidogrel (PLAVIX) 75 MG tablet TAKE 1 TABLET(75 MG) BY MOUTH DAILY 90 tablet 0  . donepezil (ARICEPT) 10 MG tablet TAKE 1 TABLET(10 MG) BY MOUTH AT BEDTIME 90 tablet 0  . Ferrous Sulfate (IRON) 325 (65 FE) MG TABS Take 325 mg by mouth 2 (two) times daily.     . furosemide (LASIX) 20 MG tablet TAKE 1 TABLET (20 MG TOTAL) BY MOUTH DAILY. (Patient taking differently: Not taking at this time) 90 tablet 1  . HEMATINIC/FOLIC ACID 098-1 MG TABS TAKE 1 TABLET BY MOUTH DAILY 30 each 2  . Insulin Glargine (LANTUS SOLOSTAR) 100 UNIT/ML Solostar Pen Inject 32 Units into the skin daily at 10 pm. Increase by 2 units every 3 days if all fsg>120 15 mL 4  . insulin lispro (HUMALOG KWIKPEN) 100 UNIT/ML KiwkPen Inject 0.05-0.1 mLs (5-10 Units total) into the skin 3 (three) times daily with meals. 15 mL 4  . Insulin Pen Needle (B-D  ULTRAFINE III SHORT PEN) 31G X 8 MM MISC Use as directed for insulin 100 each 6  . isosorbide mononitrate (IMDUR) 60 MG 24 hr tablet TAKE 1 TABLET BY MOUTH EVERY DAY 90 tablet 0  . nitroGLYCERIN (NITROSTAT) 0.4 MG SL tablet Place 0.4 mg under the tongue every 5 (five) minutes as needed for chest pain.    . Omega-3 Fatty Acids (FISH OIL) 1200 MG CAPS Take 1,200 mg by mouth daily.     . pantoprazole (PROTONIX) 40 MG tablet Take 1 tablet every Monday, Wednesday and Friday. (Patient taking differently:  daily. Take 1 tablet every Monday, Wednesday and Friday.) 30 tablet 5  . pantoprazole (PROTONIX) 40 MG tablet Take 1 tablet (40 mg total) by mouth daily. 30 tablet 1  . ranitidine (ZANTAC) 150 MG tablet Take 150 mg by mouth at bedtime.    . thiamine (VITAMIN B-1) 100 MG tablet TAKE 1 TABLET(100 MG) BY MOUTH DAILY 30 tablet 0  . traMADol-acetaminophen (ULTRACET) 37.5-325 MG tablet Take 1 tablet by mouth every 6 (six) hours as needed for moderate pain or severe pain.    Marland Kitchen triamterene-hydrochlorothiazide (MAXZIDE-25) 37.5-25 MG tablet TAKE 1 TABLET BY MOUTH DAILY 30 tablet 5   No current facility-administered medications on file prior to visit.     BP (!) 100/58 (BP Location: Left Arm, Patient Position: Sitting, Cuff Size: Normal)   Pulse (!) 59   Temp 98.1 F (36.7 C) (Oral)   Ht 5\' 6"  (1.676 m)   Wt 163 lb 6.4 oz (74.1 kg)   SpO2 98%   BMI 26.37 kg/m       Objective:   Physical Exam  General Mental Status- Alert. General Appearance- Not in acute distress.   Skin General: Color- Normal Color. Moisture- Normal Moisture.   Chest and Lung Exam Auscultation: Breath Sounds:-Normal.  Cardiovascular Auscultation:Rythm- Regular. Murmurs & Other Heart Sounds:Auscultation of the heart reveals- No Murmurs.  Abdomen Inspection:-Inspeection Normal. Palpation/Percussion:Note:No mass. Palpation and Percussion of the abdomen reveal- Non Tender, Non Distended + BS, no rebound or  guarding.    Neurologic Cranial Nerve exam:- CN III-XII intact(No nystagmus), symmetric smile. Strength:- 5/5 equal and symmetric strength both upper and lower extremities.      Assessment & Plan:  You are clinically better from fatigue. Uti was likely the cause and you got better with antibiotic.  Will put in future lipase and amylase. Can get done in 2 wks or so when you see Dr. [name]. She may order other labs as well.  OT and PT home eval pending. Hopefully will be seen today.  Continue to eat as you have been doing. Again reminder to get balance on standing before amulating.  Follow up with pcp in 2 weeks or as needed.  [place] Satz, Percell Miller, PA-C

## 2016-02-15 ENCOUNTER — Other Ambulatory Visit: Payer: Self-pay | Admitting: Family Medicine

## 2016-02-15 DIAGNOSIS — F418 Other specified anxiety disorders: Secondary | ICD-10-CM

## 2016-02-18 ENCOUNTER — Telehealth: Payer: Self-pay | Admitting: Family Medicine

## 2016-02-18 NOTE — Telephone Encounter (Signed)
Caller name: Wes Relation to pt: PT from [facility] Call back number: [phone]    Reason for call:  Requesting verbal orders for PT twice a week for 6 weeks

## 2016-02-18 NOTE — Telephone Encounter (Signed)
OK to give VO for PT as requested

## 2016-02-19 NOTE — Telephone Encounter (Signed)
Verbal orders left on voicemail as directed.

## 2016-02-21 ENCOUNTER — Telehealth: Payer: Self-pay | Admitting: Family Medicine

## 2016-02-21 NOTE — Telephone Encounter (Signed)
OK to give VO for OT as requested  

## 2016-02-21 NOTE — Telephone Encounter (Signed)
HHRN informed of verbal ok. 

## 2016-02-21 NOTE — Telephone Encounter (Signed)
Geny from [facility] called requesting verbal orders for patient.   OT 2x per week for 3 weeks for upper body strengthening and home safety.   Phone: [phone]

## 2016-02-22 ENCOUNTER — Other Ambulatory Visit: Payer: Self-pay | Admitting: Family Medicine

## 2016-02-22 ENCOUNTER — Encounter: Payer: Self-pay | Admitting: Family Medicine

## 2016-02-24 ENCOUNTER — Other Ambulatory Visit: Payer: Self-pay | Admitting: Family Medicine

## 2016-02-24 MED ORDER — VITAMIN B-1 100 MG PO TABS: 100.0000 mg | ORAL_TABLET | Freq: Every day | ORAL | 5 refills | Status: DC

## 2016-02-24 MED ORDER — PANTOPRAZOLE SODIUM 40 MG PO TBEC
DELAYED_RELEASE_TABLET | ORAL | 5 refills | Status: DC
Start: 2016-02-24 — End: 2016-07-06

## 2016-03-02 ENCOUNTER — Encounter: Payer: Self-pay | Admitting: Family Medicine

## 2016-03-02 ENCOUNTER — Ambulatory Visit (INDEPENDENT_AMBULATORY_CARE_PROVIDER_SITE_OTHER): Payer: Medicare Other | Admitting: Family Medicine

## 2016-03-02 VITALS — BP 144/72 | HR 51 | Temp 98.2°F | Wt 161.4 lb

## 2016-03-02 DIAGNOSIS — E538 Deficiency of other specified B group vitamins: Secondary | ICD-10-CM

## 2016-03-02 DIAGNOSIS — R001 Bradycardia, unspecified: Secondary | ICD-10-CM

## 2016-03-02 DIAGNOSIS — E782 Mixed hyperlipidemia: Secondary | ICD-10-CM | POA: Diagnosis not present

## 2016-03-02 DIAGNOSIS — N184 Chronic kidney disease, stage 4 (severe): Secondary | ICD-10-CM

## 2016-03-02 DIAGNOSIS — Z794 Long term (current) use of insulin: Secondary | ICD-10-CM | POA: Diagnosis not present

## 2016-03-02 DIAGNOSIS — I10 Essential (primary) hypertension: Secondary | ICD-10-CM | POA: Diagnosis not present

## 2016-03-02 DIAGNOSIS — E119 Type 2 diabetes mellitus without complications: Secondary | ICD-10-CM | POA: Diagnosis not present

## 2016-03-02 DIAGNOSIS — I739 Peripheral vascular disease, unspecified: Secondary | ICD-10-CM

## 2016-03-02 DIAGNOSIS — I200 Unstable angina: Secondary | ICD-10-CM

## 2016-03-02 LAB — TSH: TSH: 1.37 u[IU]/mL (ref 0.35–4.50)

## 2016-03-02 LAB — CBC
HEMATOCRIT: 37.2 % (ref 36.0–46.0)
HEMOGLOBIN: 12.6 g/dL (ref 12.0–15.0)
MCHC: 33.7 g/dL (ref 30.0–36.0)
MCV: 92.4 fl (ref 78.0–100.0)
Platelets: 198 10*3/uL (ref 150.0–400.0)
RBC: 4.03 Mil/uL (ref 3.87–5.11)
RDW: 14.3 % (ref 11.5–15.5)
WBC: 9.4 10*3/uL (ref 4.0–10.5)

## 2016-03-02 LAB — COMPREHENSIVE METABOLIC PANEL
ALBUMIN: 4 g/dL (ref 3.5–5.2)
ALK PHOS: 73 U/L (ref 39–117)
ALT: 11 U/L (ref 0–35)
AST: 12 U/L (ref 0–37)
BUN: 50 mg/dL — ABNORMAL HIGH (ref 6–23)
CALCIUM: 10 mg/dL (ref 8.4–10.5)
CHLORIDE: 108 meq/L (ref 96–112)
CO2: 26 mEq/L (ref 19–32)
Creatinine, Ser: 2.14 mg/dL — ABNORMAL HIGH (ref 0.40–1.20)
GFR: 23.4 mL/min — AB (ref >60.00)
Glucose, Bld: 111 mg/dL — ABNORMAL HIGH (ref 70–99)
POTASSIUM: 5.2 meq/L — AB (ref 3.5–5.1)
SODIUM: 144 meq/L (ref 135–145)
TOTAL PROTEIN: 7.1 g/dL (ref 6.0–8.3)
Total Bilirubin: 0.8 mg/dL (ref 0.2–1.2)

## 2016-03-02 LAB — LIPID PANEL
CHOLESTEROL: 193 mg/dL (ref 0–200)
HDL: 44.4 mg/dL (ref >39.00)
LDL CALC: 114 mg/dL — AB (ref 0–99)
NonHDL: 149.04
TRIGLYCERIDES: 177 mg/dL — AB (ref 0.0–149.0)
Total CHOL/HDL Ratio: 4
VLDL: 35.4 mg/dL (ref 0.0–40.0)

## 2016-03-02 LAB — HEMOGLOBIN A1C: Hgb A1c MFr Bld: 7.9 % — ABNORMAL HIGH (ref 4.6–6.5)

## 2016-03-02 LAB — VITAMIN B12: Vitamin B-12: 402 pg/mL (ref 211–911)

## 2016-03-02 MED ORDER — INSULIN LISPRO 100 UNIT/ML (KWIKPEN): 5.0000 [IU] | PEN_INJECTOR | Freq: Three times a day (TID) | SUBCUTANEOUS | 4 refills | Status: DC

## 2016-03-02 MED ORDER — INSULIN GLARGINE 100 UNIT/ML SOLOSTAR PEN
32.0000 [IU] | PEN_INJECTOR | Freq: Every day | SUBCUTANEOUS | 4 refills | Status: DC
Start: 2016-03-02 — End: 2016-12-07

## 2016-03-02 NOTE — Progress Notes (Signed)
Pre visit review using our clinic review tool, if applicable. No additional management support is needed unless otherwise documented below in the visit note. 

## 2016-03-02 NOTE — Patient Instructions (Signed)
Carbohydrate Counting for Diabetes Mellitus, Adult Carbohydrate counting is a method for keeping track of how many carbohydrates you eat. Eating carbohydrates naturally increases the amount of sugar (glucose) in the blood. Counting how many carbohydrates you eat helps keep your blood glucose within normal limits, which helps you manage your diabetes (diabetes mellitus). It is important to know how many carbohydrates you can safely have in each meal. This is different for every person. A diet and nutrition specialist (registered dietitian) can help you make a meal plan and calculate how many carbohydrates you should have at each meal and snack. Carbohydrates are found in the following foods:  Grains, such as breads and cereals.  Dried beans and soy products.  Starchy vegetables, such as potatoes, peas, and corn.  Fruit and fruit juices.  Milk and yogurt.  Sweets and snack foods, such as cake, cookies, candy, chips, and soft drinks. How do I count carbohydrates? There are two ways to count carbohydrates in food. You can use either of the methods or a combination of both. Reading "Nutrition Facts" on packaged food  The "Nutrition Facts" list is included on the labels of almost all packaged foods and beverages in the [place] It includes:  The serving size.  Information about nutrients in each serving, including the grams (g) of carbohydrate per serving. To use the "Nutrition Facts":  Decide how many servings you will have.  Multiply the number of servings by the number of carbohydrates per serving.  The resulting number is the total amount of carbohydrates that you will be having. Learning standard serving sizes of other foods  When you eat foods containing carbohydrates that are not packaged or do not include "Nutrition Facts" on the label, you need to measure the servings in order to count the amount of carbohydrates:  Measure the foods that you will eat with a food scale or measuring  cup, if needed.  Decide how many standard-size servings you will eat.  Multiply the number of servings by 15. Most carbohydrate-rich foods have about 15 g of carbohydrates per serving.  For example, if you eat 8 oz (170 g) of strawberries, you will have eaten 2 servings and 30 g of carbohydrates (2 servings x 15 g = 30 g).  For foods that have more than one food mixed, such as soups and casseroles, you must count the carbohydrates in each food that is included. The following list contains standard serving sizes of common carbohydrate-rich foods. Each of these servings has about 15 g of carbohydrates:   hamburger bun or  English muffin.   oz (15 mL) syrup.   oz (14 g) jelly.  1 slice of bread.  1 six-inch tortilla.  3 oz (85 g) cooked rice or pasta.  4 oz (113 g) cooked dried beans.  4 oz (113 g) starchy vegetable, such as peas, corn, or potatoes.  4 oz (113 g) hot cereal.  4 oz (113 g) mashed potatoes or  of a large baked potato.  4 oz (113 g) canned or frozen fruit.  4 oz (120 mL) fruit juice.  4-6 crackers.  6 chicken nuggets.  6 oz (170 g) unsweetened dry cereal.  6 oz (170 g) plain fat-free yogurt or yogurt sweetened with artificial sweeteners.  8 oz (240 mL) milk.  8 oz (170 g) fresh fruit or one small piece of fruit.  24 oz (680 g) popped popcorn. Example of carbohydrate counting Sample meal  3 oz (85 g) chicken breast.  6 oz (  170 g) brown rice.  4 oz (113 g) corn.  8 oz (240 mL) milk.  8 oz (170 g) strawberries with sugar-free whipped topping. Carbohydrate calculation 1. Identify the foods that contain carbohydrates:  Rice.  Corn.  Milk.  Strawberries. 2. Calculate how many servings you have of each food:  2 servings rice.  1 serving corn.  1 serving milk.  1 serving strawberries. 3. Multiply each number of servings by 15 g:  2 servings rice x 15 g = 30 g.  1 serving corn x 15 g = 15 g.  1 serving milk x 15 g = 15  g.  1 serving strawberries x 15 g = 15 g. 4. Add together all of the amounts to find the total grams of carbohydrates eaten:  30 g + 15 g + 15 g + 15 g = 75 g of carbohydrates total. This information is not intended to replace advice given to you by your health care provider. Make sure you discuss any questions you have with your health care provider. Document Released: 03/02/2005 Document Revised: 09/20/2015 Document Reviewed: 08/14/2015 Elsevier Interactive Patient Education  2017 [employer]  

## 2016-03-02 NOTE — Progress Notes (Signed)
Patient ID: Destiny [name], female   DOB: 06-29-1931, 82 y.o.   MRN: [id number]    Subjective:    Patient ID: Destiny [name], female    DOB: 10-21-1931, 82 y.o.   MRN: [id number]  Chief Complaint  Patient presents with  . Follow-up    HPI Patient is in today for follow up. She is accompanied by family member. She feels well today. No recent illness or hospitalizations. She feels well today. Physical therapy has helped her strength and seh has not had any falls since last visit. She denies polyuria or polydipsia. Has not been checking blood sugar regularly. Denies CP/palp/SOB/HA/congestion/fevers/GI or GU c/o. Taking meds as prescribed  Past Medical History:  Diagnosis Date  . Acute renal insufficiency 12/24/2013  . Amaurosis fugax   . Arthritis 12/06/2012  . Atherosclerosis   . CAD (coronary artery disease)    a. s/p CABG x 4 (LIMA->LAD, VG->Diag, VG->OM1->OM3);  b. 2010 Cath: 3/4 patent grafts (VG->diag occluded), 3vd, sev PVD ->Med Rx;  c. 12/2011 Lexi MV: sm area of mild mid and apical ant wall ischemia ->med rx.  . Carotid bruit   . Chronic kidney disease (CKD) stage G4/A1, severely decreased glomerular filtration rate (GFR) between 15-29 mL/min/1.73 square meter and albuminuria creatinine ratio less than 30 mg/g (HCC)    GFR 25 on 12/25/13  . CVA (cerebral infarction)   . Dementia    pt denies  . Depression 12/24/2013  . Diabetes mellitus type II    insulin dependent.   . Diabetic peripheral neuropathy ([place])   . Diabetic retinopathy   . Diverticulosis   . Duodenal ulcer 2009 and 11/2011   caused GI bleeding  . Gallstones 2009   seen on CT scan  . GI bleed 04/2011, 11/2011   found non-bleeding duodenal ulcers  . Hyperlipidemia   . Hypersomnolence 03/12/2013  . Hypertension   . Iron deficiency anemia secondary to blood loss (chronic)   . Melena 1015/2015   PEPTIC ULCER    REQUIRING TRANSFUSION  . PVD (peripheral vascular disease) ([place])   . Rib fracture 04/21/2015  .  Thiamine deficiency 08/13/2015  . UTI (urinary tract infection) 04/08/2014  . Weight loss, non-intentional 01/29/2014    Past Surgical History:  Procedure Laterality Date  . ABDOMINAL HYSTERECTOMY    . COLONOSCOPY  05/20/2011   Procedure: COLONOSCOPY;  Surgeon: Scarlette Shorts, MD;  Location: [facility];  Service: Endoscopy;  Laterality: N/A;  . CORONARY ARTERY BYPASS GRAFT  1999   x 4  . ENTEROSCOPY N/A 12/29/2013   Procedure: ENTEROSCOPY;  Surgeon: Inda Castle, MD;  Location: [facility];  Service: Endoscopy;  Laterality: N/A;  . ESOPHAGOGASTRODUODENOSCOPY  05/16/2011   Procedure: ESOPHAGOGASTRODUODENOSCOPY (EGD);  Surgeon: Gatha Mayer, MD;  Location[facility] ENDOSCOPY;  Service: Endoscopy;  Laterality: N/A;  . ESOPHAGOGASTRODUODENOSCOPY  12/08/2011   Procedure: ESOPHAGOGASTRODUODENOSCOPY (EGD);  Surgeon: Ladene Artist, MD,FACG;  Location: [facility] ENDOSCOPY;  Service: Endoscopy;  Laterality: N/A;  . UPPER GASTROINTESTINAL ENDOSCOPY  02/22/2008   w/biopsy, duedenal ulcer, antrum erosions    Family History  Problem Relation Age of Onset  . Coronary artery disease Father   . Hypertension Father   . Stroke Father   . Cancer Mother     ?  . Diabetes Maternal Uncle   . Colon cancer Neg Hx     Social History   Social History  . Marital status: Widowed    Spouse name: N/A  . Number of children: 4  .  Years of education: N/A   Occupational History  . retired Retired   Social History Main Topics  . Smoking status: Former Smoker    Types: Cigarettes    Quit date: 12/31/2000  . Smokeless tobacco: Never Used  . Alcohol use No  . Drug use: No  . Sexual activity: Not on file   Other Topics Concern  . Not on file   Social History Narrative   Widowed - husband passed away in 05/16/09   lives with daughter with epilepsy - mental retardation   Retired     Former Smoker      Alcohol use-no          Occupation: Retired       son - Kiandra Sanguinetti     Outpatient Medications Prior to  Visit  Medication Sig Dispense Refill  . amLODipine (NORVASC) 10 MG tablet TAKE 1 TABLET(10 MG) BY MOUTH DAILY 30 tablet 5  . atorvastatin (LIPITOR) 40 MG tablet TAKE 1 TABLET BY MOUTH EVERY DAY 90 tablet 0  . cephALEXin (KEFLEX) 250 MG capsule Take 1 capsule (250 mg total) by mouth 2 (two) times daily. 14 capsule 0  . Cholecalciferol (VITAMIN D) 1000 UNITS capsule Take 1,000 Units by mouth every evening.     . citalopram (CELEXA) 20 MG tablet TAKE 1 TABLET(20 MG) BY MOUTH DAILY 90 tablet 0  . donepezil (ARICEPT) 10 MG tablet TAKE 1 TABLET(10 MG) BY MOUTH AT BEDTIME 90 tablet 0  . Ferrous Sulfate (IRON) 325 (65 FE) MG TABS Take 325 mg by mouth 2 (two) times daily.     . furosemide (LASIX) 20 MG tablet TAKE 1 TABLET (20 MG TOTAL) BY MOUTH DAILY. (Patient taking differently: Not taking at this time) 90 tablet 1  . Insulin Pen Needle (B-D ULTRAFINE III SHORT PEN) 31G X 8 MM MISC Use as directed for insulin 100 each 6  . isosorbide mononitrate (IMDUR) 60 MG 24 hr tablet TAKE 1 TABLET BY MOUTH EVERY DAY 90 tablet 0  . nitroGLYCERIN (NITROSTAT) 0.4 MG SL tablet Place 0.4 mg under the tongue every 5 (five) minutes as needed for chest pain.    . Omega-3 Fatty Acids (FISH OIL) 1200 MG CAPS Take 1,200 mg by mouth daily.     . pantoprazole (PROTONIX) 40 MG tablet Take 1 tablet (40 mg total) by mouth daily. 30 tablet 1  . pantoprazole (PROTONIX) 40 MG tablet Take 1 tablet every Monday, Wednesday and Friday. 30 tablet 5  . ranitidine (ZANTAC) 150 MG tablet Take 150 mg by mouth at bedtime.    . thiamine (VITAMIN B-1) 100 MG tablet Take 1 tablet (100 mg total) by mouth daily. 30 tablet 5  . traMADol-acetaminophen (ULTRACET) 37.5-325 MG tablet Take 1 tablet by mouth every 6 (six) hours as needed for moderate pain or severe pain.    Marland Kitchen triamterene-hydrochlorothiazide (MAXZIDE-25) 37.5-25 MG tablet TAKE 1 TABLET BY MOUTH DAILY 30 tablet 5  . atorvastatin (LIPITOR) 20 MG tablet Take 20 mg by mouth daily.    .  clopidogrel (PLAVIX) 75 MG tablet TAKE 1 TABLET(75 MG) BY MOUTH DAILY 90 tablet 0  . HEMATINIC/FOLIC ACID 528-4 MG TABS TAKE 1 TABLET BY MOUTH DAILY 30 each 2  . Insulin Glargine (LANTUS SOLOSTAR) 100 UNIT/ML Solostar Pen Inject 32 Units into the skin daily at 10 pm. Increase by 2 units every 3 days if all fsg>120 15 mL 4  . insulin lispro (HUMALOG KWIKPEN) 100 UNIT/ML KiwkPen Inject 0.05-0.1 mLs (5-10  Units total) into the skin 3 (three) times daily with meals. 15 mL 4   No facility-administered medications prior to visit.     No Known Allergies  Review of Systems  Constitutional: Positive for malaise/fatigue. Negative for fever.  Eyes: Negative for blurred vision.  Respiratory: Negative for cough and shortness of breath.   Cardiovascular: Negative for chest pain and palpitations.  Gastrointestinal: Negative for vomiting.  Musculoskeletal: Negative for back pain.  Skin: Negative for rash.  Neurological: Negative for loss of consciousness and headaches.  Psychiatric/Behavioral: Positive for memory loss.       Objective:    Physical Exam  Constitutional: She is oriented to person, place, and time. She appears well-developed and well-nourished. No distress.  HENT:  Head: Normocephalic and atraumatic.  Eyes: Conjunctivae are normal.  Neck: Normal range of motion. No thyromegaly present.  Cardiovascular: Normal rate and regular rhythm.   Pulmonary/Chest: Effort normal and breath sounds normal. She has no wheezes.  Abdominal: Soft. Bowel sounds are normal. There is no tenderness.  Musculoskeletal: Normal range of motion. She exhibits no edema or deformity.  Neurological: She is alert and oriented to person, place, and time.  Skin: Skin is warm and dry. She is not diaphoretic.  Psychiatric: She has a normal mood and affect.    BP (!) 144/72   Pulse (!) 51   Temp 98.2 F (36.8 C) (Oral)   Wt 161 lb 6.4 oz (73.2 kg)   SpO2 97%   BMI 26.05 kg/m  Wt Readings from Last 3  Encounters:  03/02/16 161 lb 6.4 oz (73.2 kg)  02/12/16 163 lb 6.4 oz (74.1 kg)  02/04/16 162 lb (73.5 kg)     Lab Results  Component Value Date   WBC 9.4 03/02/2016   HGB 12.6 03/02/2016   HCT 37.2 03/02/2016   PLT 198.0 03/02/2016   GLUCOSE 111 (H) 03/02/2016   CHOL 193 03/02/2016   TRIG 177.0 (H) 03/02/2016   HDL 44.40 03/02/2016   LDLDIRECT 98.0 11/26/2015   LDLCALC 114 (H) 03/02/2016   ALT 11 03/02/2016   AST 12 03/02/2016   NA 144 03/02/2016   K 5.2 (H) 03/02/2016   CL 108 03/02/2016   CREATININE 2.14 (H) 03/02/2016   BUN 50 (H) 03/02/2016   CO2 26 03/02/2016   TSH 1.37 03/02/2016   INR 1.27 12/28/2013   HGBA1C 7.9 (H) 03/02/2016   MICROALBUR 97.4 (H) 11/26/2015    Lab Results  Component Value Date   TSH 1.37 03/02/2016   Lab Results  Component Value Date   WBC 9.4 03/02/2016   HGB 12.6 03/02/2016   HCT 37.2 03/02/2016   MCV 92.4 03/02/2016   PLT 198.0 03/02/2016   Lab Results  Component Value Date   NA 144 03/02/2016   K 5.2 (H) 03/02/2016   CO2 26 03/02/2016   GLUCOSE 111 (H) 03/02/2016   BUN 50 (H) 03/02/2016   CREATININE 2.14 (H) 03/02/2016   BILITOT 0.8 03/02/2016   ALKPHOS 73 03/02/2016   AST 12 03/02/2016   ALT 11 03/02/2016   PROT 7.1 03/02/2016   ALBUMIN 4.0 03/02/2016   CALCIUM 10.0 03/02/2016   ANIONGAP 12 12/29/2013   GFR 23.40 (L) 03/02/2016   Lab Results  Component Value Date   CHOL 193 03/02/2016   Lab Results  Component Value Date   HDL 44.40 03/02/2016   Lab Results  Component Value Date   LDLCALC 114 (H) 03/02/2016   Lab Results  Component Value Date  TRIG 177.0 (H) 03/02/2016   Lab Results  Component Value Date   CHOLHDL 4 03/02/2016   Lab Results  Component Value Date   HGBA1C 7.9 (H) 03/02/2016       Assessment & Plan:   Problem List Items Addressed This Visit    Diabetes mellitus type 2, insulin dependent ([place]) (Chronic)    hgba1c mildly elevated, minimize simple carbs. Increase exercise as  tolerated. Continue current meds but increase Lantus by 2 units every 3 days as directed to maintain BS between 100 and 150.       Relevant Medications   insulin lispro (HUMALOG KWIKPEN) 100 UNIT/ML KiwkPen   Insulin Glargine (LANTUS SOLOSTAR) 100 UNIT/ML Solostar Pen   Other Relevant Orders   Hemoglobin A1c (Completed)   Comprehensive metabolic panel (Completed)   CKD (chronic kidney disease) stage 4, GFR 15-29 ml/min (HCC) (Chronic)    Stable. Following with nephrology.      Hyperlipidemia    Tolerating statin, encouraged heart healthy diet, avoid trans fats, minimize simple carbs and saturated fats. Increase exercise as tolerated      Relevant Orders   Lipid panel (Completed)   Essential hypertension    Well controlled, no changes to meds. Encouraged heart healthy diet such as the DASH diet and exercise as tolerated. Improved on recheck      Relevant Orders   CBC (Completed)   TSH (Completed)   B12 deficiency - Primary   Relevant Orders   Vitamin B12 (Completed)   Bradycardia    Asymptomatic          I am having Ms. Rosell maintain her Iron, Vitamin D, Fish Oil, nitroGLYCERIN, Insulin Pen Needle, ranitidine, furosemide, traMADol-acetaminophen, amLODipine, triamterene-hydrochlorothiazide, pantoprazole, cephALEXin, citalopram, donepezil, atorvastatin, isosorbide mononitrate, thiamine, pantoprazole, insulin lispro, and Insulin Glargine.  Meds ordered this encounter  Medications  . insulin lispro (HUMALOG KWIKPEN) 100 UNIT/ML KiwkPen    Sig: Inject 0.05-0.1 mLs (5-10 Units total) into the skin 3 (three) times daily with meals.    Dispense:  15 mL    Refill:  4  . Insulin Glargine (LANTUS SOLOSTAR) 100 UNIT/ML Solostar Pen    Sig: Inject 32 Units into the skin daily at 10 pm. Increase by 2 units every 3 days if all fsg>120    Dispense:  15 mL    Refill:  4   CMA served as scribe during this visit. History, Physical and Plan performed by medical provider. Documentation  and orders reviewed and attested to.   Penni Homans, MD

## 2016-03-03 ENCOUNTER — Other Ambulatory Visit: Payer: Self-pay | Admitting: Family Medicine

## 2016-03-03 DIAGNOSIS — N289 Disorder of kidney and ureter, unspecified: Secondary | ICD-10-CM

## 2016-03-04 ENCOUNTER — Other Ambulatory Visit: Payer: Self-pay | Admitting: Family Medicine

## 2016-03-18 NOTE — Assessment & Plan Note (Signed)
No recent episodes of chest pain

## 2016-03-18 NOTE — Assessment & Plan Note (Signed)
Asymptomatic. 

## 2016-03-18 NOTE — Assessment & Plan Note (Signed)
Tolerating statin, encouraged heart healthy diet, avoid trans fats, minimize simple carbs and saturated fats. Increase exercise as tolerated 

## 2016-03-18 NOTE — Assessment & Plan Note (Signed)
Well controlled, no changes to meds. Encouraged heart healthy diet such as the DASH diet and exercise as tolerated. Improved on recheck 

## 2016-03-18 NOTE — Assessment & Plan Note (Signed)
Stable Following with nephrology 

## 2016-03-18 NOTE — Assessment & Plan Note (Signed)
Encouraged to stay physically active as tolerated. Continue plavix and control blood sugar.

## 2016-03-18 NOTE — Assessment & Plan Note (Signed)
hgba1c mildly elevated, minimize simple carbs. Increase exercise as tolerated. Continue current meds but increase Lantus by 2 units every 3 days as directed to maintain BS between 100 and 150.

## 2016-04-03 ENCOUNTER — Other Ambulatory Visit: Payer: Medicare Other

## 2016-04-11 ENCOUNTER — Other Ambulatory Visit: Payer: Self-pay | Admitting: Family Medicine

## 2016-05-09 ENCOUNTER — Other Ambulatory Visit: Payer: Self-pay | Admitting: Family Medicine

## 2016-05-09 ENCOUNTER — Encounter: Payer: Self-pay | Admitting: Family Medicine

## 2016-05-09 DIAGNOSIS — F418 Other specified anxiety disorders: Secondary | ICD-10-CM

## 2016-05-23 ENCOUNTER — Other Ambulatory Visit: Payer: Self-pay | Admitting: Family Medicine

## 2016-06-07 ENCOUNTER — Other Ambulatory Visit: Payer: Self-pay | Admitting: Family Medicine

## 2016-06-27 ENCOUNTER — Other Ambulatory Visit: Payer: Self-pay | Admitting: Family Medicine

## 2016-07-02 ENCOUNTER — Ambulatory Visit: Payer: Medicare Other | Admitting: Family Medicine

## 2016-07-02 ENCOUNTER — Ambulatory Visit: Payer: Medicare Other | Admitting: *Deleted

## 2016-07-03 ENCOUNTER — Ambulatory Visit: Payer: Medicare Other | Admitting: Family Medicine

## 2016-07-06 ENCOUNTER — Ambulatory Visit (INDEPENDENT_AMBULATORY_CARE_PROVIDER_SITE_OTHER): Payer: Medicare Other | Admitting: Family Medicine

## 2016-07-06 ENCOUNTER — Encounter: Payer: Self-pay | Admitting: Family Medicine

## 2016-07-06 VITALS — BP 150/65 | HR 52 | Wt 163.2 lb

## 2016-07-06 DIAGNOSIS — R32 Unspecified urinary incontinence: Secondary | ICD-10-CM

## 2016-07-06 DIAGNOSIS — E119 Type 2 diabetes mellitus without complications: Secondary | ICD-10-CM | POA: Diagnosis not present

## 2016-07-06 DIAGNOSIS — E118 Type 2 diabetes mellitus with unspecified complications: Secondary | ICD-10-CM

## 2016-07-06 DIAGNOSIS — I10 Essential (primary) hypertension: Secondary | ICD-10-CM | POA: Diagnosis not present

## 2016-07-06 DIAGNOSIS — N184 Chronic kidney disease, stage 4 (severe): Secondary | ICD-10-CM | POA: Diagnosis not present

## 2016-07-06 DIAGNOSIS — Z0000 Encounter for general adult medical examination without abnormal findings: Secondary | ICD-10-CM | POA: Diagnosis not present

## 2016-07-06 DIAGNOSIS — D649 Anemia, unspecified: Secondary | ICD-10-CM | POA: Diagnosis not present

## 2016-07-06 DIAGNOSIS — Z794 Long term (current) use of insulin: Secondary | ICD-10-CM

## 2016-07-06 DIAGNOSIS — E539 Vitamin B deficiency, unspecified: Secondary | ICD-10-CM | POA: Diagnosis not present

## 2016-07-06 DIAGNOSIS — F0390 Unspecified dementia without behavioral disturbance: Secondary | ICD-10-CM | POA: Diagnosis not present

## 2016-07-06 DIAGNOSIS — Z23 Encounter for immunization: Secondary | ICD-10-CM

## 2016-07-06 DIAGNOSIS — E538 Deficiency of other specified B group vitamins: Secondary | ICD-10-CM

## 2016-07-06 DIAGNOSIS — E785 Hyperlipidemia, unspecified: Secondary | ICD-10-CM

## 2016-07-06 LAB — COMPREHENSIVE METABOLIC PANEL
ALBUMIN: 3.9 g/dL (ref 3.5–5.2)
ALK PHOS: 76 U/L (ref 39–117)
ALT: 12 U/L (ref 0–35)
AST: 14 U/L (ref 0–37)
BILIRUBIN TOTAL: 1 mg/dL (ref 0.2–1.2)
BUN: 41 mg/dL — ABNORMAL HIGH (ref 6–23)
CO2: 25 mEq/L (ref 19–32)
Calcium: 9.8 mg/dL (ref 8.4–10.5)
Chloride: 103 mEq/L (ref 96–112)
Creatinine, Ser: 2.08 mg/dL — ABNORMAL HIGH (ref 0.40–1.20)
GFR: 24.16 mL/min — AB (ref >60.00)
GLUCOSE: 211 mg/dL — AB (ref 70–99)
Potassium: 4.4 mEq/L (ref 3.5–5.1)
SODIUM: 137 meq/L (ref 135–145)
TOTAL PROTEIN: 7.1 g/dL (ref 6.0–8.3)

## 2016-07-06 LAB — URINALYSIS, ROUTINE W REFLEX MICROSCOPIC
BILIRUBIN URINE: NEGATIVE
Ketones, ur: NEGATIVE
Nitrite: NEGATIVE
PH: 6 (ref 5.0–8.0)
Specific Gravity, Urine: >=1.03 — AB (ref 1.000–1.030)
URINE GLUCOSE: NEGATIVE
UROBILINOGEN UA: 0.2 (ref 0.0–1.0)

## 2016-07-06 LAB — LIPID PANEL
Cholesterol: 214 mg/dL — ABNORMAL HIGH (ref 0–200)
HDL: 41.6 mg/dL (ref >39.00)
NONHDL: 172.38
TRIGLYCERIDES: 283 mg/dL — AB (ref 0.0–149.0)
Total CHOL/HDL Ratio: 5
VLDL: 56.6 mg/dL — ABNORMAL HIGH (ref 0.0–40.0)

## 2016-07-06 LAB — LDL CHOLESTEROL, DIRECT: Direct LDL: 116 mg/dL

## 2016-07-06 LAB — CBC
HEMATOCRIT: 37.6 % (ref 36.0–46.0)
Hemoglobin: 12.7 g/dL (ref 12.0–15.0)
MCHC: 33.7 g/dL (ref 30.0–36.0)
MCV: 94.1 fl (ref 78.0–100.0)
Platelets: 192 10*3/uL (ref 150.0–400.0)
RBC: 4 Mil/uL (ref 3.87–5.11)
RDW: 13.5 % (ref 11.5–15.5)
WBC: 8.4 10*3/uL (ref 4.0–10.5)

## 2016-07-06 LAB — TSH: TSH: 2.18 u[IU]/mL (ref 0.35–4.50)

## 2016-07-06 LAB — FERRITIN: FERRITIN: 515 ng/mL — AB (ref 10.0–291.0)

## 2016-07-06 LAB — HEMOGLOBIN A1C: Hgb A1c MFr Bld: 8.5 % — ABNORMAL HIGH (ref 4.6–6.5)

## 2016-07-06 LAB — VITAMIN B12: VITAMIN B 12: 480 pg/mL (ref 211–911)

## 2016-07-06 MED ORDER — ISOSORBIDE MONONITRATE ER 30 MG PO TB24: 30.0000 mg | ORAL_TABLET | Freq: Every day | ORAL | 2 refills | Status: DC

## 2016-07-06 MED ORDER — FUROSEMIDE 20 MG PO TABS: ORAL_TABLET | ORAL | 2 refills | Status: DC

## 2016-07-06 MED ORDER — PNEUMOCOCCAL 13-VAL CONJ VACC IM SUSP: 0.5000 mL | Freq: Once | INTRAMUSCULAR | 0 refills | Status: AC

## 2016-07-06 NOTE — Progress Notes (Addendum)
Patient ID: Destiny [name], female   DOB: 01/20/1932, 83 y.o.   MRN: [id number]   Subjective:  I acted as a Education administrator for Penni Homans, [place], [place]   Patient ID: Destiny [name], female    DOB: 1931/10/17, 83 y.o.   MRN: [id number]  Chief Complaint  Patient presents with  . Follow-up    Vitamin B-12 F/U  . Medicare Wellness    HPI  Patient is in today for a four month follow up Vitamin B-12 Deficiency. Patient's Caregiver states that Patient's Son is concerned that the Patient has been having loose, black stools for a few weeks. Patient has a Hx of CAD, , Type II Diabetes, Dementia. Patient has no additional acute concerns noted at this time. Several weeks backs she was having numerous loose stool daily but she is now just having one and she denies any blood or pain associated with this. Is noting some tolerable urinary incontinence. Does not limit activity but she has to plan her use of the restroom. No dysuria or polydipsia. Denies CP/palp/SOB/HA/congestion/fevers. Taking meds as prescribed  Patient Care Team: Mosie Lukes, MD as PCP - General (Family Medicine)   Past Medical History:  Diagnosis Date  . Acute renal insufficiency 12/24/2013  . Amaurosis fugax   . Arthritis 12/06/2012  . Atherosclerosis   . CAD (coronary artery disease)    a. s/p CABG x 4 (LIMA->LAD, VG->Diag, VG->OM1->OM3);  b. 2010 Cath: 3/4 patent grafts (VG->diag occluded), 3vd, sev PVD ->Med Rx;  c. 12/2011 Lexi MV: sm area of mild mid and apical ant wall ischemia ->med rx.  . Carotid bruit   . Chronic kidney disease (CKD) stage G4/A1, severely decreased glomerular filtration rate (GFR) between 15-29 mL/min/1.73 square meter and albuminuria creatinine ratio less than 30 mg/g (HCC)    GFR 25 on 12/25/13  . CVA (cerebral infarction)   . Dementia    pt denies  . Depression 12/24/2013  . Diabetes mellitus type II    insulin dependent.   . Diabetic peripheral neuropathy ([place])   . Diabetic retinopathy   .  Diverticulosis   . Duodenal ulcer 2009 and 11/2011   caused GI bleeding  . Gallstones 2009   seen on CT scan  . GI bleed 04/2011, 11/2011   found non-bleeding duodenal ulcers  . Hyperlipidemia   . Hypersomnolence 03/12/2013  . Hypertension   . Iron deficiency anemia secondary to blood loss (chronic)   . Melena 1015/2015   PEPTIC ULCER    REQUIRING TRANSFUSION  . PVD (peripheral vascular disease) ([place])   . Rib fracture 04/21/2015  . Thiamine deficiency 08/13/2015  . UTI (urinary tract infection) 04/08/2014  . Weight loss, non-intentional 01/29/2014    Past Surgical History:  Procedure Laterality Date  . ABDOMINAL HYSTERECTOMY    . COLONOSCOPY  05/20/2011   Procedure: COLONOSCOPY;  Surgeon: Scarlette Shorts, MD;  Location: [facility];  Service: Endoscopy;  Laterality: N/A;  . CORONARY ARTERY BYPASS GRAFT  1999   x 4  . ENTEROSCOPY N/A 12/29/2013   Procedure: ENTEROSCOPY;  Surgeon: Inda Castle, MD;  Location: [facility];  Service: Endoscopy;  Laterality: N/A;  . ESOPHAGOGASTRODUODENOSCOPY  05/16/2011   Procedure: ESOPHAGOGASTRODUODENOSCOPY (EGD);  Surgeon: Gatha Mayer, MD;  [facility]: [facility] ENDOSCOPY;  Service: Endoscopy;  Laterality: N/A;  . ESOPHAGOGASTRODUODENOSCOPY  12/08/2011   Procedure: ESOPHAGOGASTRODUODENOSCOPY (EGD);  Surgeon: Ladene Artist, MD,FACG;  Location: [facility] ENDOSCOPY;  Service: Endoscopy;  Laterality: N/A;  . UPPER GASTROINTESTINAL ENDOSCOPY  02/22/2008  w/biopsy, duedenal ulcer, antrum erosions    Family History  Problem Relation Age of Onset  . Coronary artery disease Father   . Hypertension Father   . Stroke Father   . Cancer Mother     ?  . Diabetes Maternal Uncle   . Colon cancer Neg Hx     Social History   Social History  . Marital status: Widowed    Spouse name: N/A  . Number of children: 4  . Years of education: N/A   Occupational History  . retired Retired   Social History Main Topics  . Smoking status: Former Smoker    Types: Cigarettes     Quit date: 12/31/2000  . Smokeless tobacco: Never Used  . Alcohol use No  . Drug use: No  . Sexual activity: Not on file   Other Topics Concern  . Not on file   Social History Narrative   Widowed - husband passed away in 2009-05-30   lives with daughter with epilepsy - mental retardation   Retired     Former Smoker      Alcohol use-no          Occupation: Retired       son - Inita Uram     Outpatient Medications Prior to Visit  Medication Sig Dispense Refill  . amLODipine (NORVASC) 10 MG tablet TAKE 1 TABLET(10 MG) BY MOUTH DAILY 30 tablet 5  . atorvastatin (LIPITOR) 40 MG tablet TAKE 1 TABLET BY MOUTH EVERY DAY 90 tablet 0  . B-D UF III MINI PEN NEEDLES 31G X 5 MM MISC USE AS DIRECTED FOR INSULIN 100 each 0  . Cholecalciferol (VITAMIN D) 1000 UNITS capsule Take 1,000 Units by mouth every evening.     . citalopram (CELEXA) 20 MG tablet TAKE 1 TABLET(20 MG) BY MOUTH DAILY 90 tablet 0  . clopidogrel (PLAVIX) 75 MG tablet TAKE 1 TABLET(75 MG) BY MOUTH DAILY 90 tablet 2  . donepezil (ARICEPT) 10 MG tablet TAKE 1 TABLET(10 MG) BY MOUTH AT BEDTIME 90 tablet 0  . Ferrous Sulfate (IRON) 325 (65 FE) MG TABS Take 325 mg by mouth 2 (two) times daily.     Marland Kitchen HEMATINIC/FOLIC ACID 570-1 MG TABS TAKE 1 TABLET BY MOUTH DAILY 30 each 0  . Insulin Glargine (LANTUS SOLOSTAR) 100 UNIT/ML Solostar Pen Inject 32 Units into the skin daily at 10 pm. Increase by 2 units every 3 days if all fsg>120 15 mL 4  . insulin lispro (HUMALOG KWIKPEN) 100 UNIT/ML KiwkPen Inject 0.05-0.1 mLs (5-10 Units total) into the skin 3 (three) times daily with meals. 15 mL 4  . Insulin Pen Needle (B-D ULTRAFINE III SHORT PEN) 31G X 8 MM MISC Use as directed for insulin 100 each 6  . isosorbide mononitrate (IMDUR) 60 MG 24 hr tablet TAKE 1 TABLET BY MOUTH EVERY DAY 90 tablet 0  . nitroGLYCERIN (NITROSTAT) 0.4 MG SL tablet Place 0.4 mg under the tongue every 5 (five) minutes as needed for chest pain.    . Omega-3 Fatty Acids (FISH  OIL) 1200 MG CAPS Take 1,200 mg by mouth daily.     . pantoprazole (PROTONIX) 40 MG tablet Take 1 tablet (40 mg total) by mouth daily. 30 tablet 1  . ranitidine (ZANTAC) 150 MG tablet Take 150 mg by mouth at bedtime.    . thiamine (VITAMIN B-1) 100 MG tablet Take 1 tablet (100 mg total) by mouth daily. 30 tablet 5  . traMADol-acetaminophen (ULTRACET) 37.5-325 MG  tablet Take 1 tablet by mouth every 6 (six) hours as needed for moderate pain or severe pain.    Marland Kitchen triamterene-hydrochlorothiazide (MAXZIDE-25) 37.5-25 MG tablet TAKE 1 TABLET BY MOUTH DAILY 30 tablet 5  . furosemide (LASIX) 20 MG tablet TAKE 1 TABLET (20 MG TOTAL) BY MOUTH DAILY. (Patient taking differently: Not taking at this time) 90 tablet 1  . cephALEXin (KEFLEX) 250 MG capsule Take 1 capsule (250 mg total) by mouth 2 (two) times daily. (Patient not taking: Reported on 07/06/2016) 14 capsule 0  . HEMATINIC/FOLIC ACID 696-2 MG TABS TAKE 1 TABLET BY MOUTH DAILY (Patient not taking: Reported on 07/06/2016) 30 each 2  . pantoprazole (PROTONIX) 40 MG tablet Take 1 tablet every Monday, Wednesday and Friday. (Patient not taking: Reported on 07/06/2016) 30 tablet 5   No facility-administered medications prior to visit.     No Known Allergies  Review of Systems  Constitutional: Negative for fever and malaise/fatigue.  HENT: Negative for congestion.   Eyes: Negative for blurred vision.  Respiratory: Negative for cough and shortness of breath.   Cardiovascular: Positive for leg swelling. Negative for chest pain and palpitations.       R leg swelling.  Gastrointestinal: Positive for diarrhea. Negative for abdominal pain, blood in stool, constipation, heartburn, melena, nausea and vomiting.  Genitourinary: Negative.   Musculoskeletal: Negative for back pain.  Skin: Negative for rash.  Neurological: Negative for loss of consciousness and headaches.       Objective:    Physical Exam  Constitutional: She is oriented to person, place, and  time. She appears well-developed and well-nourished. No distress.  HENT:  Head: Normocephalic and atraumatic.  Eyes: Conjunctivae are normal.  Neck: Normal range of motion. No thyromegaly present.  Cardiovascular: Regular rhythm.   bradycardia  Pulmonary/Chest: Effort normal and breath sounds normal. She has no wheezes.  Abdominal: Soft. Bowel sounds are normal. There is no tenderness.  Musculoskeletal: She exhibits no edema or deformity.  Neurological: She is alert and oriented to person, place, and time.  Skin: Skin is warm and dry. She is not diaphoretic.  Psychiatric: She has a normal mood and affect.    BP (!) 150/65 (BP Location: Right Arm, Patient Position: Sitting, Cuff Size: Normal)   Pulse (!) 52   Wt 163 lb 3.2 oz (74 kg)   SpO2 98% Comment: RA  BMI 26.34 kg/m  Wt Readings from Last 3 Encounters:  07/06/16 163 lb 3.2 oz (74 kg)  03/02/16 161 lb 6.4 oz (73.2 kg)  02/12/16 163 lb 6.4 oz (74.1 kg)   BP Readings from Last 3 Encounters:  07/06/16 (!) 150/65  03/18/16 (!) 144/72  02/12/16 (!) 100/58     Immunization History  Administered Date(s) Administered  . Influenza Split 12/07/2011  . Influenza Whole 03/28/2007, 01/09/2008, 12/25/2008, 11/27/2009  . Influenza, High Dose Seasonal PF 01/05/2015, 11/26/2015  . Influenza,inj,Quad PF,36+ Mos 12/06/2012, 12/29/2013  . Influenza-Unspecified 01/05/2015  . Pneumococcal Polysaccharide-23 01/10/2008, 11/15/2012  . Td 07/04/2007    Health Maintenance  Topic Date Due  . DEXA SCAN  04/17/1998  . PNA vac Low Risk Adult (2 of 2 - PCV13) 11/15/2013  . OPHTHALMOLOGY EXAM  03/18/2016  . HEMOGLOBIN A1C  08/31/2016  . INFLUENZA VACCINE  10/14/2016  . URINE MICROALBUMIN  11/25/2016  . TETANUS/TDAP  07/03/2017  . FOOT EXAM  07/06/2017    Lab Results  Component Value Date   WBC 8.4 07/06/2016   HGB 12.7 07/06/2016   HCT 37.6 07/06/2016  PLT 192.0 07/06/2016   GLUCOSE 211 (H) 07/06/2016   CHOL 214 (H) 07/06/2016    TRIG 283.0 (H) 07/06/2016   HDL 41.60 07/06/2016   LDLDIRECT 116.0 07/06/2016   LDLCALC 114 (H) 03/02/2016   ALT 12 07/06/2016   AST 14 07/06/2016   NA 137 07/06/2016   K 4.4 07/06/2016   CL 103 07/06/2016   CREATININE 2.08 (H) 07/06/2016   BUN 41 (H) 07/06/2016   CO2 25 07/06/2016   TSH 2.18 07/06/2016   INR 1.27 12/28/2013   HGBA1C 8.5 (H) 07/06/2016   MICROALBUR 97.4 (H) 11/26/2015    Lab Results  Component Value Date   TSH 2.18 07/06/2016   Lab Results  Component Value Date   WBC 8.4 07/06/2016   HGB 12.7 07/06/2016   HCT 37.6 07/06/2016   MCV 94.1 07/06/2016   PLT 192.0 07/06/2016   Lab Results  Component Value Date   NA 137 07/06/2016   K 4.4 07/06/2016   CO2 25 07/06/2016   GLUCOSE 211 (H) 07/06/2016   BUN 41 (H) 07/06/2016   CREATININE 2.08 (H) 07/06/2016   BILITOT 1.0 07/06/2016   ALKPHOS 76 07/06/2016   AST 14 07/06/2016   ALT 12 07/06/2016   PROT 7.1 07/06/2016   ALBUMIN 3.9 07/06/2016   CALCIUM 9.8 07/06/2016   ANIONGAP 12 12/29/2013   GFR 24.16 (L) 07/06/2016   Lab Results  Component Value Date   CHOL 214 (H) 07/06/2016   Lab Results  Component Value Date   HDL 41.60 07/06/2016   Lab Results  Component Value Date   LDLCALC 114 (H) 03/02/2016   Lab Results  Component Value Date   TRIG 283.0 (H) 07/06/2016   Lab Results  Component Value Date   CHOLHDL 5 07/06/2016   Lab Results  Component Value Date   HGBA1C 8.5 (H) 07/06/2016         Assessment & Plan:   Problem List Items Addressed This Visit    Diabetes mellitus type 2, insulin dependent ([place]) (Chronic)    hgba1c acceptable, minimize simple carbs. Increase exercise as tolerated. Continue current meds      CKD (chronic kidney disease) stage 4, GFR 15-29 ml/min (HCC) (Chronic)    Follows with nephrology and will repeat CMP today. Keep PPI and diuretics to a minimum      Hyperlipidemia    Tolerating statin, encouraged heart healthy diet, avoid trans fats, minimize  simple carbs and saturated fats. Increase exercise as tolerated      Relevant Medications   furosemide (LASIX) 20 MG tablet   isosorbide mononitrate (IMDUR) 30 MG 24 hr tablet   Other Relevant Orders   Lipid panel (Completed)   Essential hypertension    Bradycardic with CRI will try increasing her Imdur to 60 mg in am and 30 mg in pm and see if this improves. Minimize sodium      Relevant Medications   furosemide (LASIX) 20 MG tablet   isosorbide mononitrate (IMDUR) 30 MG 24 hr tablet   Other Relevant Orders   CBC (Completed)   TSH (Completed)   Comprehensive metabolic panel (Completed)   Dementia    Very pleasant but still having trouble answering questions. Has an aide with her her hleps      B12 deficiency    Check level today, continue supplements      Anemia    Increase leafy greens, consider increased lean red meat and using cast iron cookware. Continue to monitor, report any concerns. Continue  iron check cbc and ferritin      Relevant Orders   Ferritin (Completed)    Other Visit Diagnoses    Urinary incontinence, unspecified type    -  Primary   Relevant Orders   Urinalysis   Type 2 diabetes mellitus with complication, unspecified whether long term insulin use (HCC)       Relevant Orders   Hemoglobin A1c (Completed)   Vitamin B deficiency       Relevant Orders   Vitamin B12 (Completed)   Need for pneumococcal vaccination       Relevant Medications   pneumococcal 13-valent conjugate vaccine (PREVNAR 13) SUSP injection      I have discontinued Ms. Sohm's cephALEXin. I have also changed her furosemide. Additionally, I am having her start on isosorbide mononitrate and pneumococcal 13-valent conjugate vaccine. Lastly, I am having her maintain her Iron, Vitamin D, Fish Oil, nitroGLYCERIN, Insulin Pen Needle, ranitidine, traMADol-acetaminophen, amLODipine, triamterene-hydrochlorothiazide, pantoprazole, donepezil, atorvastatin, thiamine, insulin lispro, Insulin  Glargine, B-D UF III MINI PEN NEEDLES, HEMATINIC/FOLIC ACID, citalopram, isosorbide mononitrate, and clopidogrel.  Meds ordered this encounter  Medications  . furosemide (LASIX) 20 MG tablet    Sig: TAKE 1 TABLET (20 MG TOTAL) BY MOUTH DAILY PRN.  FOR WEIGHT GAIN >3# IN A 24 HOURS, EDEMA, SOB    Dispense:  30 tablet    Refill:  2  . isosorbide mononitrate (IMDUR) 30 MG 24 hr tablet    Sig: Take 1 tablet (30 mg total) by mouth daily.    Dispense:  30 tablet    Refill:  2    Take later on in the evening.  . pneumococcal 13-valent conjugate vaccine (PREVNAR 13) SUSP injection    Sig: Inject 0.5 mLs into the muscle once.    Dispense:  0.5 mL    Refill:  0    CMA served as scribe during this visit. History, Physical and Plan performed by medical provider. Documentation and orders reviewed and attested to.  Penni Homans, MD

## 2016-07-06 NOTE — Assessment & Plan Note (Signed)
Tolerating statin, encouraged heart healthy diet, avoid trans fats, minimize simple carbs and saturated fats. Increase exercise as tolerated 

## 2016-07-06 NOTE — Progress Notes (Signed)
Pre visit review using our clinic review tool, if applicable. No additional management support is needed unless otherwise documented below in the visit note. 

## 2016-07-06 NOTE — Assessment & Plan Note (Signed)
Increase leafy greens, consider increased lean red meat and using cast iron cookware. Continue to monitor, report any concerns. Continue iron check cbc and ferritin

## 2016-07-06 NOTE — Assessment & Plan Note (Signed)
Very pleasant but still having trouble answering questions. Has an aide with her her hleps

## 2016-07-06 NOTE — Progress Notes (Signed)
Subjective:   Destiny [name] is a 83 y.o. female who presents for an Initial Medicare Annual Wellness Visit. She is accompanied by a caregiver today.  Review of Systems    No ROS.  Medicare Wellness Visit.  Cardiac Risk Factors include: advanced age (>4men, >34 women);diabetes mellitus;dyslipidemia;hypertension;sedentary lifestyle  Sleep patterns: no sleep issues Home Safety/Smoke Alarms:Feels safe in home. Smoke alarms in place.   Living environment; residence and Firearm Safety: Lives alone and has a caregiver who comes in M-F from 9a-1p. 1-story house/ trailer, walk-in shower. Unsure if she has firearms or not. She states her husband used to have Comptroller. If they are still in the home, patient and caregiver report they are stored securely and out of reach. Non-skid mat and grab bars in shower.   Counseling:   Eye Exam- Follows w/ eye doctor yearly, cannot remember name of provider. Dental- Follows w/ dentist PRN. Cannot remember name of provider. Dentures upper and lower.  Female:   Pap- Aged out      Mammo- Aged out Dexa scan- Not on file.   CCS- Aged out     Objective:    Today's Vitals   07/06/16 1045 07/06/16 1118  BP: (!) 150/44 (!) 150/65  Pulse: (!) 52   SpO2: 98%   Weight: 163 lb 3.2 oz (74 kg)   PainSc: 0-No pain    Body mass index is 26.34 kg/m.   Current Medications (verified) Outpatient Encounter Prescriptions as of 07/06/2016  Medication Sig  . amLODipine (NORVASC) 10 MG tablet TAKE 1 TABLET(10 MG) BY MOUTH DAILY  . atorvastatin (LIPITOR) 40 MG tablet TAKE 1 TABLET BY MOUTH EVERY DAY  . B-D UF III MINI PEN NEEDLES 31G X 5 MM MISC USE AS DIRECTED FOR INSULIN  . Cholecalciferol (VITAMIN D) 1000 UNITS capsule Take 1,000 Units by mouth every evening.   . citalopram (CELEXA) 20 MG tablet TAKE 1 TABLET(20 MG) BY MOUTH DAILY  . clopidogrel (PLAVIX) 75 MG tablet TAKE 1 TABLET(75 MG) BY MOUTH DAILY  . donepezil (ARICEPT) 10 MG tablet TAKE 1 TABLET(10  MG) BY MOUTH AT BEDTIME  . Ferrous Sulfate (IRON) 325 (65 FE) MG TABS Take 325 mg by mouth 2 (two) times daily.   . furosemide (LASIX) 20 MG tablet TAKE 1 TABLET (20 MG TOTAL) BY MOUTH DAILY PRN.  FOR WEIGHT GAIN >3# IN A 24 HOURS, EDEMA, SOB  . HEMATINIC/FOLIC ACID 161-0 MG TABS TAKE 1 TABLET BY MOUTH DAILY  . Insulin Glargine (LANTUS SOLOSTAR) 100 UNIT/ML Solostar Pen Inject 32 Units into the skin daily at 10 pm. Increase by 2 units every 3 days if all fsg>120  . insulin lispro (HUMALOG KWIKPEN) 100 UNIT/ML KiwkPen Inject 0.05-0.1 mLs (5-10 Units total) into the skin 3 (three) times daily with meals.  . Insulin Pen Needle (B-D ULTRAFINE III SHORT PEN) 31G X 8 MM MISC Use as directed for insulin  . isosorbide mononitrate (IMDUR) 60 MG 24 hr tablet TAKE 1 TABLET BY MOUTH EVERY DAY  . nitroGLYCERIN (NITROSTAT) 0.4 MG SL tablet Place 0.4 mg under the tongue every 5 (five) minutes as needed for chest pain.  . Omega-3 Fatty Acids (FISH OIL) 1200 MG CAPS Take 1,200 mg by mouth daily.   . pantoprazole (PROTONIX) 40 MG tablet Take 1 tablet (40 mg total) by mouth daily.  . ranitidine (ZANTAC) 150 MG tablet Take 150 mg by mouth at bedtime.  . thiamine (VITAMIN B-1) 100 MG tablet Take 1 tablet (100 mg  total) by mouth daily.  . traMADol-acetaminophen (ULTRACET) 37.5-325 MG tablet Take 1 tablet by mouth every 6 (six) hours as needed for moderate pain or severe pain.  Marland Kitchen triamterene-hydrochlorothiazide (MAXZIDE-25) 37.5-25 MG tablet TAKE 1 TABLET BY MOUTH DAILY  . [DISCONTINUED] furosemide (LASIX) 20 MG tablet TAKE 1 TABLET (20 MG TOTAL) BY MOUTH DAILY. (Patient taking differently: Not taking at this time)  . isosorbide mononitrate (IMDUR) 30 MG 24 hr tablet Take 1 tablet (30 mg total) by mouth daily.  . pneumococcal 13-valent conjugate vaccine (PREVNAR 13) SUSP injection Inject 0.5 mLs into the muscle once.  . [DISCONTINUED] cephALEXin (KEFLEX) 250 MG capsule Take 1 capsule (250 mg total) by mouth 2 (two)  times daily. (Patient not taking: Reported on 07/06/2016)  . [DISCONTINUED] HEMATINIC/FOLIC ACID 287-8 MG TABS TAKE 1 TABLET BY MOUTH DAILY (Patient not taking: Reported on 07/06/2016)  . [DISCONTINUED] pantoprazole (PROTONIX) 40 MG tablet Take 1 tablet every Monday, Wednesday and Friday. (Patient not taking: Reported on 07/06/2016)   No facility-administered encounter medications on file as of 07/06/2016.     Allergies (verified) Patient has no known allergies.   History: Past Medical History:  Diagnosis Date  . Acute renal insufficiency 12/24/2013  . Amaurosis fugax   . Arthritis 12/06/2012  . Atherosclerosis   . CAD (coronary artery disease)    a. s/p CABG x 4 (LIMA->LAD, VG->Diag, VG->OM1->OM3);  b. 2010 Cath: 3/4 patent grafts (VG->diag occluded), 3vd, sev PVD ->Med Rx;  c. 12/2011 Lexi MV: sm area of mild mid and apical ant wall ischemia ->med rx.  . Carotid bruit   . Chronic kidney disease (CKD) stage G4/A1, severely decreased glomerular filtration rate (GFR) between 15-29 mL/min/1.73 square meter and albuminuria creatinine ratio less than 30 mg/g (HCC)    GFR 25 on 12/25/13  . CVA (cerebral infarction)   . Dementia    pt denies  . Depression 12/24/2013  . Diabetes mellitus type II    insulin dependent.   . Diabetic peripheral neuropathy ([place])   . Diabetic retinopathy   . Diverticulosis   . Duodenal ulcer 2009 and 11/2011   caused GI bleeding  . Gallstones 2009   seen on CT scan  . GI bleed 04/2011, 11/2011   found non-bleeding duodenal ulcers  . Hyperlipidemia   . Hypersomnolence 03/12/2013  . Hypertension   . Iron deficiency anemia secondary to blood loss (chronic)   . Melena 1015/2015   PEPTIC ULCER    REQUIRING TRANSFUSION  . PVD (peripheral vascular disease) ([place])   . Rib fracture 04/21/2015  . Thiamine deficiency 08/13/2015  . UTI (urinary tract infection) 04/08/2014  . Weight loss, non-intentional 01/29/2014   Past Surgical History:  Procedure Laterality Date  .  ABDOMINAL HYSTERECTOMY    . COLONOSCOPY  05/20/2011   Procedure: COLONOSCOPY;  Surgeon: Scarlette Shorts, MD;  Location: [facility];  Service: Endoscopy;  Laterality: N/A;  . CORONARY ARTERY BYPASS GRAFT  1999   x 4  . ENTEROSCOPY N/A 12/29/2013   Procedure: ENTEROSCOPY;  Surgeon: Inda Castle, MD;  Location: [facility];  Service: Endoscopy;  Laterality: N/A;  . ESOPHAGOGASTRODUODENOSCOPY  05/16/2011   Procedure: ESOPHAGOGASTRODUODENOSCOPY (EGD);  Surgeon: Gatha Mayer, MD;  Location: [facility] ENDOSCOPY;  Service: Endoscopy;  Laterality: N/A;  . ESOPHAGOGASTRODUODENOSCOPY  12/08/2011   Procedure: ESOPHAGOGASTRODUODENOSCOPY (EGD);  Surgeon: Ladene Artist, MD,FACG;  Location: [facility] ENDOSCOPY;  Service: Endoscopy;  Laterality: N/A;  . UPPER GASTROINTESTINAL ENDOSCOPY  02/22/2008   w/biopsy, duedenal ulcer, antrum erosions  Family History  Problem Relation Age of Onset  . Coronary artery disease Father   . Hypertension Father   . Stroke Father   . Cancer Mother     ?  . Diabetes Maternal Uncle   . Colon cancer Neg Hx    Social History   Occupational History  . retired Retired   Social History Main Topics  . Smoking status: Former Smoker    Types: Cigarettes    Quit date: 12/31/2000  . Smokeless tobacco: Never Used  . Alcohol use No  . Drug use: No  . Sexual activity: Not on file    Tobacco Counseling Counseling given: Not Answered   Activities of Daily Living In your present state of health, do you have any difficulty performing the following activities: 07/06/2016  Hearing? N  Vision? N  Difficulty concentrating or making decisions? Y  Walking or climbing stairs? N  Dressing or bathing? N  Doing errands, shopping? N  Preparing Food and eating ? Y  Using the Toilet? N  In the past six months, have you accidently leaked urine? N  Do you have problems with loss of bowel control? N  Managing your Medications? Y  Managing your Finances? Y  Housekeeping or managing your  Housekeeping? Y  Some recent data might be hidden    Immunizations and Health Maintenance Immunization History  Administered Date(s) Administered  . Influenza Split 12/07/2011  . Influenza Whole 03/28/2007, 01/09/2008, 12/25/2008, 11/27/2009  . Influenza, High Dose Seasonal PF 01/05/2015, 11/26/2015  . Influenza,inj,Quad PF,36+ Mos 12/06/2012, 12/29/2013  . Influenza-Unspecified 01/05/2015  . Pneumococcal Polysaccharide-23 01/10/2008, 11/15/2012  . Td 07/04/2007   Health Maintenance Due  Topic Date Due  . DEXA SCAN  04/17/1998  . PNA vac Low Risk Adult (2 of 2 - PCV13) 11/15/2013  . OPHTHALMOLOGY EXAM  03/18/2016    Patient Care Team: Mosie Lukes, MD as PCP - General (Family Medicine)  Indicate any recent Medical Services you may have received from other than [facility] providers in the past year (date may be approximate).     Assessment:   This is a routine wellness examination for Destiny [name]. Physical assessment deferred to PCP.  Hearing/Vision screen Hearing Screening Comments: Able to hear conversational tones w/o difficulty. No issues reported.  Vision Screening Comments: Wears glasses. Follows w/ eye doctor yearly. No issues reported.  Dietary issues and exercise activities discussed: Current Exercise Habits: The patient does not participate in regular exercise at present  Diet (meal preparation, eat out, water intake, caffeinated beverages, dairy products, fruits and vegetables): in general, a "healthy" diet  , well balanced, on average, 3 meals per day. Does not drink enough water. Drinks 1-2 cups of coffee daily.       Goals    . Increase physical activity    . Increase water intake          Drink a glass of water with every meal.      Depression Screen PHQ 2/9 Scores 07/06/2016 03/02/2016 11/26/2015 04/03/2014 10/27/2012  PHQ - 2 Score 0 0 0 3 0  PHQ- 9 Score - - - 12 -    Fall Risk Fall Risk  07/06/2016 11/26/2015 04/03/2014 10/27/2012  Falls in the past year? Yes  No No No  Number falls in past yr: 1 - - -  Follow up Falls prevention discussed - - -    Cognitive Function: MMSE - Mini Mental State Exam 07/06/2016  Orientation to time 2  Orientation to Place  5  Registration 3  Attention/ Calculation 5  Recall 2  Language- name 2 objects 2  Language- repeat 1  Language- follow 3 step command 3  Language- read & follow direction 1  Write a sentence 1  Copy design 1  Total score 26        Screening Tests Health Maintenance  Topic Date Due  . DEXA SCAN  04/17/1998  . PNA vac Low Risk Adult (2 of 2 - PCV13) 11/15/2013  . OPHTHALMOLOGY EXAM  03/18/2016  . HEMOGLOBIN A1C  08/31/2016  . INFLUENZA VACCINE  10/14/2016  . URINE MICROALBUMIN  11/25/2016  . TETANUS/TDAP  07/03/2017  . FOOT EXAM  07/06/2017      Plan:    Follow-up w/ PCP as directed.  Rx for Prevnar printed and given to patient per PCP.  Bring a copy of your advance directives to your next office visit.  Pt would like to address DEXA w/ PCP at next office visit.  During the course of the visit, Destiny [name] was educated and counseled about the following appropriate screening and preventive services:   Vaccines to include Pneumococcal, Influenza, Td, HCV  Cardiovascular disease screening  Bone density screening  Diabetes screening  Glaucoma screening  Nutrition counseling  Patient Instructions (the written plan) were given to the patient.    Dorrene German, RN   07/06/2016

## 2016-07-06 NOTE — Assessment & Plan Note (Signed)
Follows with nephrology and will repeat CMP today. Keep PPI and diuretics to a minimum

## 2016-07-06 NOTE — Patient Instructions (Addendum)
Encouraged to perform Kegel exercises 10 times daily (tighten muscles to stop urine flow).  Encouraged to mix Benefiber into your drink twice daily to help bulk up your bowels.  Take Protonix three times per week. If heartburn persists, call Dr. [name] to discuss Zantac. Don't lie down immediately after eating. Bring a copy of your advance directives to your next office visit.  Kegel Exercises Kegel exercises help strengthen the muscles that support the rectum, vagina, small intestine, bladder, and uterus. Doing Kegel exercises can help:  Improve bladder and bowel control.  Improve sexual response.  Reduce problems and discomfort during pregnancy. Kegel exercises involve squeezing your pelvic floor muscles, which are the same muscles you squeeze when you try to stop the flow of urine. The exercises can be done while sitting, standing, or lying down, but it is best to vary your position. Phase 1 exercises 1. Squeeze your pelvic floor muscles tight. You should feel a tight lift in your rectal area. If you are a female, you should also feel a tightness in your vaginal area. Keep your stomach, buttocks, and legs relaxed. 2. Hold the muscles tight for up to 10 seconds. 3. Relax your muscles. Repeat this exercise 50 times a day or as many times as told by your health care provider. Continue to do this exercise for at least 4-6 weeks or for as long as told by your health care provider. This information is not intended to replace advice given to you by your health care provider. Make sure you discuss any questions you have with your health care provider. Document Released: 02/17/2012 Document Revised: 10/26/2015 Document Reviewed: 01/20/2015 Elsevier Interactive Patient Education  2017 [employer].

## 2016-07-06 NOTE — Assessment & Plan Note (Signed)
Bradycardic with CRI will try increasing her Imdur to 60 mg in am and 30 mg in pm and see if this improves. Minimize sodium

## 2016-07-06 NOTE — Assessment & Plan Note (Signed)
hgba1c acceptable, minimize simple carbs. Increase exercise as tolerated. Continue current meds 

## 2016-07-06 NOTE — Assessment & Plan Note (Signed)
Check level today, continue supplements

## 2016-07-11 ENCOUNTER — Encounter: Payer: Self-pay | Admitting: Family Medicine

## 2016-07-14 ENCOUNTER — Other Ambulatory Visit: Payer: Self-pay | Admitting: Family Medicine

## 2016-07-14 MED ORDER — PANTOPRAZOLE SODIUM 40 MG PO TBEC: 40.0000 mg | DELAYED_RELEASE_TABLET | Freq: Every day | ORAL | 1 refills | Status: DC

## 2016-07-18 ENCOUNTER — Encounter: Payer: Self-pay | Admitting: Family Medicine

## 2016-07-20 DIAGNOSIS — I10 Essential (primary) hypertension: Secondary | ICD-10-CM | POA: Diagnosis not present

## 2016-07-20 DIAGNOSIS — N184 Chronic kidney disease, stage 4 (severe): Secondary | ICD-10-CM | POA: Diagnosis not present

## 2016-08-01 ENCOUNTER — Other Ambulatory Visit: Payer: Self-pay | Admitting: Family Medicine

## 2016-08-23 ENCOUNTER — Other Ambulatory Visit: Payer: Self-pay | Admitting: Family Medicine

## 2016-08-23 DIAGNOSIS — F418 Other specified anxiety disorders: Secondary | ICD-10-CM

## 2016-08-29 ENCOUNTER — Other Ambulatory Visit: Payer: Self-pay | Admitting: Family Medicine

## 2016-09-16 ENCOUNTER — Other Ambulatory Visit: Payer: Self-pay | Admitting: Family Medicine

## 2016-10-10 ENCOUNTER — Encounter: Payer: Self-pay | Admitting: Family Medicine

## 2016-10-12 ENCOUNTER — Ambulatory Visit (INDEPENDENT_AMBULATORY_CARE_PROVIDER_SITE_OTHER): Payer: Medicare Other | Admitting: Family Medicine

## 2016-10-12 ENCOUNTER — Encounter: Payer: Self-pay | Admitting: Family Medicine

## 2016-10-12 VITALS — BP 157/59 | HR 54 | Temp 98.2°F | Resp 18 | Wt 162.8 lb

## 2016-10-12 DIAGNOSIS — Z794 Long term (current) use of insulin: Secondary | ICD-10-CM

## 2016-10-12 DIAGNOSIS — I509 Heart failure, unspecified: Secondary | ICD-10-CM | POA: Diagnosis not present

## 2016-10-12 DIAGNOSIS — R001 Bradycardia, unspecified: Secondary | ICD-10-CM

## 2016-10-12 DIAGNOSIS — E663 Overweight: Secondary | ICD-10-CM | POA: Diagnosis not present

## 2016-10-12 DIAGNOSIS — E782 Mixed hyperlipidemia: Secondary | ICD-10-CM

## 2016-10-12 DIAGNOSIS — R600 Localized edema: Secondary | ICD-10-CM

## 2016-10-12 DIAGNOSIS — E538 Deficiency of other specified B group vitamins: Secondary | ICD-10-CM

## 2016-10-12 DIAGNOSIS — I10 Essential (primary) hypertension: Secondary | ICD-10-CM | POA: Diagnosis not present

## 2016-10-12 DIAGNOSIS — E119 Type 2 diabetes mellitus without complications: Secondary | ICD-10-CM

## 2016-10-12 DIAGNOSIS — I25810 Atherosclerosis of coronary artery bypass graft(s) without angina pectoris: Secondary | ICD-10-CM

## 2016-10-12 HISTORY — DX: Localized edema: R60.0

## 2016-10-12 LAB — COMPREHENSIVE METABOLIC PANEL
ALBUMIN: 3.7 g/dL (ref 3.5–5.2)
ALK PHOS: 75 U/L (ref 39–117)
ALT: 14 U/L (ref 0–35)
AST: 13 U/L (ref 0–37)
BUN: 36 mg/dL — ABNORMAL HIGH (ref 6–23)
CHLORIDE: 103 meq/L (ref 96–112)
CO2: 26 mEq/L (ref 19–32)
Calcium: 9.1 mg/dL (ref 8.4–10.5)
Creatinine, Ser: 1.77 mg/dL — ABNORMAL HIGH (ref 0.40–1.20)
GFR: 29.08 mL/min — AB (ref >60.00)
Glucose, Bld: 226 mg/dL — ABNORMAL HIGH (ref 70–99)
POTASSIUM: 4.4 meq/L (ref 3.5–5.1)
Sodium: 137 mEq/L (ref 135–145)
TOTAL PROTEIN: 6.9 g/dL (ref 6.0–8.3)
Total Bilirubin: 1 mg/dL (ref 0.2–1.2)

## 2016-10-12 LAB — CBC
HEMATOCRIT: 37 % (ref 36.0–46.0)
HEMOGLOBIN: 12.5 g/dL (ref 12.0–15.0)
MCHC: 33.8 g/dL (ref 30.0–36.0)
MCV: 94.4 fl (ref 78.0–100.0)
Platelets: 198 10*3/uL (ref 150.0–400.0)
RBC: 3.92 Mil/uL (ref 3.87–5.11)
RDW: 13.2 % (ref 11.5–15.5)
WBC: 8.5 10*3/uL (ref 4.0–10.5)

## 2016-10-12 LAB — LIPID PANEL
CHOLESTEROL: 181 mg/dL (ref 0–200)
HDL: 39.6 mg/dL (ref >39.00)
NonHDL: 140.91
TRIGLYCERIDES: 264 mg/dL — AB (ref 0.0–149.0)
Total CHOL/HDL Ratio: 5
VLDL: 52.8 mg/dL — AB (ref 0.0–40.0)

## 2016-10-12 LAB — LDL CHOLESTEROL, DIRECT: LDL DIRECT: 94 mg/dL

## 2016-10-12 LAB — TSH: TSH: 3.15 u[IU]/mL (ref 0.35–4.50)

## 2016-10-12 LAB — HEMOGLOBIN A1C: Hgb A1c MFr Bld: 8.6 % — ABNORMAL HIGH (ref 4.6–6.5)

## 2016-10-12 MED ORDER — AMLODIPINE BESYLATE 10 MG PO TABS: 5.0000 mg | ORAL_TABLET | Freq: Two times a day (BID) | ORAL | 5 refills | Status: DC

## 2016-10-12 MED ORDER — ISOSORBIDE MONONITRATE ER 30 MG PO TB24: 30.0000 mg | ORAL_TABLET | Freq: Every day | ORAL | 2 refills | Status: DC

## 2016-10-12 NOTE — Assessment & Plan Note (Signed)
Change Amlodipine to 5 mg po bid

## 2016-10-12 NOTE — Assessment & Plan Note (Signed)
R>L try compression hose, minimize sodium, elevate feet over heart bid.

## 2016-10-12 NOTE — Assessment & Plan Note (Addendum)
Improved on recheck, asymptomatic, no changes today but referred to cardiology for evaluation

## 2016-10-12 NOTE — Patient Instructions (Signed)

## 2016-10-12 NOTE — Assessment & Plan Note (Signed)
Is asynptmatic at this time.

## 2016-10-12 NOTE — Assessment & Plan Note (Signed)
Encouraged DASH diet, decrease po intake and increase exercise as tolerated. Needs 7-8 hours of sleep nightly. Avoid trans fats, eat small, frequent meals every 4-5 hours with lean proteins, complex carbs and healthy fats. Minimize simple carbs 

## 2016-10-12 NOTE — Assessment & Plan Note (Signed)
hgba1c unacceptable last check but they have cut down on portion sizes and now fasting sugars in the 140s and 150s. Recheck hgba1c today, minimize simple carbs. Increase exercise as tolerated. Continue current meds

## 2016-10-12 NOTE — Progress Notes (Signed)
Subjective:  I acted as a Education administrator for Dr. [name]. [name], [place]  Patient ID: Destiny [name], female    DOB: 1931/10/31, 83 y.o.   MRN: [id number]  Chief Complaint  Patient presents with  . Follow-up    HPI  Patient is in today for a follow up. Overall she is doing well. Continues to struggle with some mild edema in ight lower extremity. Worse as she is up longer. No reent trauma. No recent hospitalizations, Denies CP/palp/SOB/HA/congestion/fevers/GI or GU c/o. Taking meds as prescribed  Patient Care Team: Mosie Lukes, MD as PCP - General (Family Medicine)   Past Medical History:  Diagnosis Date  . Acute renal insufficiency 12/24/2013  . Amaurosis fugax   . Arthritis 12/06/2012  . Atherosclerosis   . CAD (coronary artery disease)    a. s/p CABG x 4 (LIMA->LAD, VG->Diag, VG->OM1->OM3);  b. 2010 Cath: 3/4 patent grafts (VG->diag occluded), 3vd, sev PVD ->Med Rx;  c. 12/2011 Lexi MV: sm area of mild mid and apical ant wall ischemia ->med rx.  . Carotid bruit   . Chronic kidney disease (CKD) stage G4/A1, severely decreased glomerular filtration rate (GFR) between 15-29 mL/min/1.73 square meter and albuminuria creatinine ratio less than 30 mg/g (HCC)    GFR 25 on 12/25/13  . CVA (cerebral infarction)   . Dementia    pt denies  . Depression 12/24/2013  . Diabetes mellitus type II    insulin dependent.   . Diabetic peripheral neuropathy ([place])   . Diabetic retinopathy   . Diverticulosis   . Duodenal ulcer 2009 and 11/2011   caused GI bleeding  . Gallstones 2009   seen on CT scan  . GI bleed 04/2011, 11/2011   found non-bleeding duodenal ulcers  . Hyperlipidemia   . Hypersomnolence 03/12/2013  . Hypertension   . Iron deficiency anemia secondary to blood loss (chronic)   . Melena 1015/2015   PEPTIC ULCER    REQUIRING TRANSFUSION  . Pedal edema 10/12/2016  . PVD (peripheral vascular disease) ([place])   . Rib fracture 04/21/2015  . Thiamine deficiency 08/13/2015  . UTI (urinary tract  infection) 04/08/2014  . Weight loss, non-intentional 01/29/2014    Past Surgical History:  Procedure Laterality Date  . ABDOMINAL HYSTERECTOMY    . COLONOSCOPY  05/20/2011   Procedure: COLONOSCOPY;  Surgeon: Scarlette Shorts, MD;  Location: [facility];  Service: Endoscopy;  Laterality: N/A;  . CORONARY ARTERY BYPASS GRAFT  1999   x 4  . ENTEROSCOPY N/A 12/29/2013   Procedure: ENTEROSCOPY;  Surgeon: Inda Castle, MD;  Location: [facility];  Service: Endoscopy;  Laterality: N/A;  . ESOPHAGOGASTRODUODENOSCOPY  05/16/2011   Procedure: ESOPHAGOGASTRODUODENOSCOPY (EGD);  Surgeon: Gatha Mayer, MD;  Location: [facility] ENDOSCOPY;  Service: Endoscopy;  Laterality: N/A;  . ESOPHAGOGASTRODUODENOSCOPY  12/08/2011   Procedure: ESOPHAGOGASTRODUODENOSCOPY (EGD);  Surgeon: Ladene Artist, MD,FACG;  Location: [facility] ENDOSCOPY;  Service: Endoscopy;  Laterality: N/A;  . UPPER GASTROINTESTINAL ENDOSCOPY  02/22/2008   w/biopsy, duedenal ulcer, antrum erosions    Family History  Problem Relation Age of Onset  . Coronary artery disease Father   . Hypertension Father   . Stroke Father   . Cancer Mother        ?  . Diabetes Maternal Uncle   . Colon cancer Neg Hx     Social History   Social History  . Marital status: Widowed    Spouse name: N/A  . Number of children: 4  . Years of education:  N/A   Occupational History  . retired Retired   Social History Main Topics  . Smoking status: Former Smoker    Types: Cigarettes    Quit date: 12/31/2000  . Smokeless tobacco: Never Used  . Alcohol use No  . Drug use: No  . Sexual activity: Not on file   Other Topics Concern  . Not on file   Social History Narrative   Widowed - husband passed away in 2009/06/03   lives with daughter with epilepsy - mental retardation   Retired     Former Smoker      Alcohol use-no          Occupation: Retired       son - Bria Sparr     Outpatient Medications Prior to Visit  Medication Sig Dispense Refill  .  atorvastatin (LIPITOR) 40 MG tablet TAKE 1 TABLET BY MOUTH EVERY DAY 90 tablet 0  . B-D UF III MINI PEN NEEDLES 31G X 5 MM MISC USE AS DIRECTED FOR INSULIN 100 each 0  . Cholecalciferol (VITAMIN D) 1000 UNITS capsule Take 1,000 Units by mouth every evening.     . citalopram (CELEXA) 20 MG tablet TAKE 1 TABLET(20 MG) BY MOUTH DAILY 90 tablet 0  . clopidogrel (PLAVIX) 75 MG tablet TAKE 1 TABLET(75 MG) BY MOUTH DAILY 90 tablet 2  . donepezil (ARICEPT) 10 MG tablet TAKE 1 TABLET(10 MG) BY MOUTH AT BEDTIME 90 tablet 0  . Ferrous Sulfate (IRON) 325 (65 FE) MG TABS Take 325 mg by mouth 2 (two) times daily.     . furosemide (LASIX) 20 MG tablet TAKE 1 TABLET (20 MG TOTAL) BY MOUTH DAILY PRN.  FOR WEIGHT GAIN >3# IN A 24 HOURS, EDEMA, SOB 30 tablet 2  . HEMATINIC/FOLIC ACID 409-8 MG TABS TAKE 1 TABLET BY MOUTH DAILY 30 each 0  . Insulin Glargine (LANTUS SOLOSTAR) 100 UNIT/ML Solostar Pen Inject 32 Units into the skin daily at 10 pm. Increase by 2 units every 3 days if all fsg>120 15 mL 4  . insulin lispro (HUMALOG KWIKPEN) 100 UNIT/ML KiwkPen Inject 0.05-0.1 mLs (5-10 Units total) into the skin 3 (three) times daily with meals. 15 mL 4  . Insulin Pen Needle (B-D ULTRAFINE III SHORT PEN) 31G X 8 MM MISC Use as directed for insulin 100 each 6  . isosorbide mononitrate (IMDUR) 30 MG 24 hr tablet Take 1 tablet (30 mg total) by mouth daily. 90 tablet 2  . isosorbide mononitrate (IMDUR) 60 MG 24 hr tablet TAKE 1 TABLET BY MOUTH EVERY DAY 90 tablet 0  . LANTUS SOLOSTAR 100 UNIT/ML Solostar Pen ADMINISTER 32 UNITS UNDER THE SKIN DAILY AT 10 PM. INCREASE BY 2 UNITS EVERY 3 DAYS IF ALL FSG>120 15 mL 0  . nitroGLYCERIN (NITROSTAT) 0.4 MG SL tablet Place 0.4 mg under the tongue every 5 (five) minutes as needed for chest pain.    . Omega-3 Fatty Acids (FISH OIL) 1200 MG CAPS Take 1,200 mg by mouth daily.     . pantoprazole (PROTONIX) 40 MG tablet TAKE 1 TABLET(40 MG) BY MOUTH DAILY 90 tablet 1  . ranitidine (ZANTAC)  300 MG tablet TAKE 1 TABLET BY MOUTH EVERY NIGHT AT BEDTIME 90 tablet 0  . thiamine (VITAMIN B-1) 100 MG tablet Take 1 tablet (100 mg total) by mouth daily. 30 tablet 5  . traMADol-acetaminophen (ULTRACET) 37.5-325 MG tablet Take 1 tablet by mouth every 6 (six) hours as needed for moderate pain or severe pain.    Marland Kitchen  triamterene-hydrochlorothiazide (MAXZIDE-25) 37.5-25 MG tablet TAKE 1 TABLET BY MOUTH DAILY 30 tablet 5  . amLODipine (NORVASC) 10 MG tablet TAKE 1 TABLET(10 MG) BY MOUTH DAILY 30 tablet 5  . ranitidine (ZANTAC) 150 MG tablet Take 150 mg by mouth at bedtime.     No facility-administered medications prior to visit.     No Known Allergies  Review of Systems  Constitutional: Positive for malaise/fatigue. Negative for fever.  HENT: Negative for congestion.   Eyes: Negative for blurred vision.  Respiratory: Negative for shortness of breath.   Cardiovascular: Positive for leg swelling. Negative for chest pain and palpitations.  Gastrointestinal: Negative for abdominal pain, blood in stool and nausea.  Genitourinary: Negative for dysuria and frequency.  Musculoskeletal: Negative for falls.  Skin: Negative for rash.  Neurological: Negative for dizziness, loss of consciousness and headaches.  Endo/Heme/Allergies: Negative for environmental allergies.  Psychiatric/Behavioral: Negative for depression. The patient is not nervous/anxious.        Objective:    Physical Exam  Constitutional: She is oriented to person, place, and time. She appears well-developed and well-nourished. No distress.  HENT:  Head: Normocephalic and atraumatic.  Nose: Nose normal.  Eyes: Right eye exhibits no discharge. Left eye exhibits no discharge.  Neck: Normal range of motion. Neck supple.  Cardiovascular: Normal rate and regular rhythm.   No murmur heard. Pulmonary/Chest: Effort normal and breath sounds normal.  Abdominal: Soft. Bowel sounds are normal. There is no tenderness.  Musculoskeletal: She  exhibits edema.  Neurological: She is alert and oriented to person, place, and time.  Skin: Skin is warm and dry.  Psychiatric: She has a normal mood and affect.  Nursing note and vitals reviewed.   BP (!) 157/59 (BP Location: Left Arm, Patient Position: Sitting, Cuff Size: Normal)   Pulse (!) 54   Temp 98.2 F (36.8 C) (Oral)   Resp 18   Wt 162 lb 12.8 oz (73.8 kg)   SpO2 91%   BMI 26.28 kg/m  Wt Readings from Last 3 Encounters:  10/12/16 162 lb 12.8 oz (73.8 kg)  07/06/16 163 lb 3.2 oz (74 kg)  03/02/16 161 lb 6.4 oz (73.2 kg)   BP Readings from Last 3 Encounters:  10/12/16 (!) 157/59  07/06/16 (!) 150/65  03/18/16 (!) 144/72     Immunization History  Administered Date(s) Administered  . Influenza Split 12/07/2011  . Influenza Whole 03/28/2007, 01/09/2008, 12/25/2008, 11/27/2009  . Influenza, High Dose Seasonal PF 01/05/2015, 11/26/2015  . Influenza,inj,Quad PF,36+ Mos 12/06/2012, 12/29/2013  . Influenza-Unspecified 01/05/2015  . Pneumococcal Polysaccharide-23 01/10/2008, 11/15/2012  . Td 07/04/2007    Health Maintenance  Topic Date Due  . DEXA SCAN  04/17/1998  . PNA vac Low Risk Adult (2 of 2 - PCV13) 11/15/2013  . OPHTHALMOLOGY EXAM  03/18/2016  . INFLUENZA VACCINE  10/14/2016  . URINE MICROALBUMIN  11/25/2016  . HEMOGLOBIN A1C  01/05/2017  . TETANUS/TDAP  07/03/2017  . FOOT EXAM  07/06/2017    Lab Results  Component Value Date   WBC 8.5 10/12/2016   HGB 12.5 10/12/2016   HCT 37.0 10/12/2016   PLT 198.0 10/12/2016   GLUCOSE 226 (H) 10/12/2016   CHOL 181 10/12/2016   TRIG 264.0 (H) 10/12/2016   HDL 39.60 10/12/2016   LDLDIRECT 94.0 10/12/2016   LDLCALC 114 (H) 03/02/2016   ALT 14 10/12/2016   AST 13 10/12/2016   NA 137 10/12/2016   K 4.4 10/12/2016   CL 103 10/12/2016   CREATININE 1.77 (H)  10/12/2016   BUN 36 (H) 10/12/2016   CO2 26 10/12/2016   TSH 3.15 10/12/2016   INR 1.27 12/28/2013   HGBA1C 8.6 (H) 10/12/2016   MICROALBUR 97.4 (H)  11/26/2015    Lab Results  Component Value Date   TSH 3.15 10/12/2016   Lab Results  Component Value Date   WBC 8.5 10/12/2016   HGB 12.5 10/12/2016   HCT 37.0 10/12/2016   MCV 94.4 10/12/2016   PLT 198.0 10/12/2016   Lab Results  Component Value Date   NA 137 10/12/2016   K 4.4 10/12/2016   CO2 26 10/12/2016   GLUCOSE 226 (H) 10/12/2016   BUN 36 (H) 10/12/2016   CREATININE 1.77 (H) 10/12/2016   BILITOT 1.0 10/12/2016   ALKPHOS 75 10/12/2016   AST 13 10/12/2016   ALT 14 10/12/2016   PROT 6.9 10/12/2016   ALBUMIN 3.7 10/12/2016   CALCIUM 9.1 10/12/2016   ANIONGAP 12 12/29/2013   GFR 29.08 (L) 10/12/2016   Lab Results  Component Value Date   CHOL 181 10/12/2016   Lab Results  Component Value Date   HDL 39.60 10/12/2016   Lab Results  Component Value Date   LDLCALC 114 (H) 03/02/2016   Lab Results  Component Value Date   TRIG 264.0 (H) 10/12/2016   Lab Results  Component Value Date   CHOLHDL 5 10/12/2016   Lab Results  Component Value Date   HGBA1C 8.6 (H) 10/12/2016         Assessment & Plan:   Problem List Items Addressed This Visit    Diabetes mellitus type 2, insulin dependent ([place]) (Chronic)    hgba1c unacceptable last check but they have cut down on portion sizes and now fasting sugars in the 140s and 150s. Recheck hgba1c today, minimize simple carbs. Increase exercise as tolerated. Continue current meds      Relevant Orders   Hemoglobin A1c (Completed)   Overweight (BMI 25.0-29.9) (Chronic)    Encouraged DASH diet, decrease po intake and increase exercise as tolerated. Needs 7-8 hours of sleep nightly. Avoid trans fats, eat small, frequent meals every 4-5 hours with lean proteins, complex carbs and healthy fats. Minimize simple carbs      Hyperlipidemia    Tolerating statin, encouraged heart healthy diet, avoid trans fats, minimize simple carbs and saturated fats. Increase exercise as tolerated      Relevant Medications   amLODipine  (NORVASC) 10 MG tablet   Other Relevant Orders   Lipid panel (Completed)   Essential hypertension    Change Amlodipine to 5 mg po bid      Relevant Medications   amLODipine (NORVASC) 10 MG tablet   Other Relevant Orders   CBC (Completed)   Comprehensive metabolic panel (Completed)   TSH (Completed)   B12 deficiency    Taking daily symptoms, monitor with labs.      Bradycardia - Primary    Improved on recheck, asymptomatic, no changes today but referred to cardiology for evaluation      Relevant Orders   Ambulatory referral to Cardiology   CAD (coronary artery disease) of artery bypass graft    Is asynptmatic at this time.      Relevant Medications   amLODipine (NORVASC) 10 MG tablet   Pedal edema    R>L try compression hose, minimize sodium, elevate feet over heart bid.        Other Visit Diagnoses    Congestive heart failure, unspecified HF chronicity, unspecified heart failure type ([place])  Relevant Medications   amLODipine (NORVASC) 10 MG tablet   Other Relevant Orders   Ambulatory referral to Cardiology      I have changed Ms. Smither's amLODipine. I am also having her maintain her Iron, Vitamin D, Fish Oil, nitroGLYCERIN, Insulin Pen Needle, traMADol-acetaminophen, triamterene-hydrochlorothiazide, atorvastatin, thiamine, insulin lispro, Insulin Glargine, B-D UF III MINI PEN NEEDLES, HEMATINIC/FOLIC ACID, clopidogrel, furosemide, pantoprazole, donepezil, ranitidine, citalopram, LANTUS SOLOSTAR, isosorbide mononitrate, and isosorbide mononitrate.  Meds ordered this encounter  Medications  . amLODipine (NORVASC) 10 MG tablet    Sig: Take 0.5 tablets (5 mg total) by mouth 2 (two) times daily.    Dispense:  30 tablet    Refill:  5    CMA served as scribe during this visit. History, Physical and Plan performed by medical provider. Documentation and orders reviewed and attested to.  Penni Homans, MD

## 2016-10-12 NOTE — Assessment & Plan Note (Signed)
Taking daily symptoms, monitor with labs.

## 2016-10-12 NOTE — Assessment & Plan Note (Signed)
Tolerating statin, encouraged heart healthy diet, avoid trans fats, minimize simple carbs and saturated fats. Increase exercise as tolerated 

## 2016-10-13 ENCOUNTER — Other Ambulatory Visit: Payer: Self-pay | Admitting: Family Medicine

## 2016-10-14 ENCOUNTER — Telehealth: Payer: Self-pay | Admitting: Family Medicine

## 2016-10-14 NOTE — Telephone Encounter (Signed)
Called son to notify of patient results and he states patient is on Lantus and Humalog.  She is not on levimir.  Here lab notes advised to increase levimir 2 units?

## 2016-10-14 NOTE — Telephone Encounter (Signed)
Thanks same direction just increase Lantus by 2 units daily

## 2016-10-14 NOTE — Telephone Encounter (Signed)
Caller Name: [name]  Relation to pt:son  Call back number:[phone]   Reason for call:  GREG son of patient returning Shirlean Mylar call regarding lab results, please advise

## 2016-10-15 NOTE — Telephone Encounter (Signed)
Son Destiny [name] notified

## 2016-10-17 ENCOUNTER — Encounter: Payer: Self-pay | Admitting: Family Medicine

## 2016-10-24 ENCOUNTER — Encounter: Payer: Self-pay | Admitting: Family Medicine

## 2016-10-28 ENCOUNTER — Encounter: Payer: Self-pay | Admitting: Family Medicine

## 2016-11-02 ENCOUNTER — Encounter: Payer: Self-pay | Admitting: Family Medicine

## 2016-11-09 ENCOUNTER — Ambulatory Visit: Payer: Medicare Other | Admitting: Cardiology

## 2016-11-10 ENCOUNTER — Other Ambulatory Visit: Payer: Self-pay | Admitting: Family Medicine

## 2016-11-10 DIAGNOSIS — F418 Other specified anxiety disorders: Secondary | ICD-10-CM

## 2016-12-06 NOTE — Progress Notes (Signed)
Cardiology Office Note   Date:  12/06/2016   ID:  Destiny [name], DOB December 09, 1931, MRN [id number]  PCP:  Mosie Lukes, MD  Cardiologist:   Jenkins Rouge, MD   No chief complaint on file.     History of Present Illness: Destiny [name] is a 83 y.o. female who presents for consultation regarding bradycardia. Referred by Dr Randel Pigg  She has a history of coronary artery disease status post CABG. Stress Myoview  12/2011  which showed a small area of mild mid and apical anterior wall ischemia ejection fraction 72%. She was treated medically. Her last cardiac catheterization was in 10/10 that showed 3 or 4 grafts patent and normal LV function.   The patient was admitted to the hospital on 06/04/12 with recurrent chest pain. Some of it was atypical but other somewhat typical. She ruled out for an MI. Diagnostic cardiac catheterization was discussed. She had mildly abnormal stress test in the fall and wished to pursue medical therapy. Her Imdur was increased to 60 mg daily.   She also has baseline bradycardia and she was kept off her beta blocker. She is still on aricept for demential. Noted to have HR low 50's during office visit with primary in July and referred to [place] even though asymptomatic   She also has mild renal insufficiency with baseline Cr 1.7  She is living independently but has help. She has been on namenda with no benefit and aricept over 10 years with no benefit Denies chest pain has some LE edema worse in RLE    Past Medical History:  Diagnosis Date  . Acute renal insufficiency 12/24/2013  . Amaurosis fugax   . Arthritis 12/06/2012  . Atherosclerosis   . CAD (coronary artery disease)    a. s/p CABG x 4 (LIMA->LAD, VG->Diag, VG->OM1->OM3);  b. 2010 Cath: 3/4 patent grafts (VG->diag occluded), 3vd, sev PVD ->Med Rx;  c. 12/2011 Lexi MV: sm area of mild mid and apical ant wall ischemia ->med rx.  . Carotid bruit   . Chronic kidney disease (CKD) stage G4/A1, severely  decreased glomerular filtration rate (GFR) between 15-29 mL/min/1.73 square meter and albuminuria creatinine ratio less than 30 mg/g (HCC)    GFR 25 on 12/25/13  . CVA (cerebral infarction)   . Dementia    pt denies  . Depression 12/24/2013  . Diabetes mellitus type II    insulin dependent.   . Diabetic peripheral neuropathy ([place])   . Diabetic retinopathy   . Diverticulosis   . Duodenal ulcer 2009 and 11/2011   caused GI bleeding  . Gallstones 2009   seen on CT scan  . GI bleed 04/2011, 11/2011   found non-bleeding duodenal ulcers  . Hyperlipidemia   . Hypersomnolence 03/12/2013  . Hypertension   . Iron deficiency anemia secondary to blood loss (chronic)   . Melena 1015/2015   PEPTIC ULCER    REQUIRING TRANSFUSION  . Pedal edema 10/12/2016  . PVD (peripheral vascular disease) ([place])   . Rib fracture 04/21/2015  . Thiamine deficiency 08/13/2015  . UTI (urinary tract infection) 04/08/2014  . Weight loss, non-intentional 01/29/2014    Past Surgical History:  Procedure Laterality Date  . ABDOMINAL HYSTERECTOMY    . COLONOSCOPY  05/20/2011   Procedure: COLONOSCOPY;  Surgeon: Scarlette Shorts, MD;  Location: [facility];  Service: Endoscopy;  Laterality: N/A;  . CORONARY ARTERY BYPASS GRAFT  1999   x 4  . ENTEROSCOPY N/A 12/29/2013   Procedure: ENTEROSCOPY;  Surgeon: Inda Castle, MD;  Location: [facility];  Service: Endoscopy;  Laterality: N/A;  . ESOPHAGOGASTRODUODENOSCOPY  05/16/2011   Procedure: ESOPHAGOGASTRODUODENOSCOPY (EGD);  Surgeon: Gatha Mayer, MD;  [facility] ENDOSCOPY;  Service: Endoscopy;  Laterality: N/A;  . ESOPHAGOGASTRODUODENOSCOPY  12/08/2011   Procedure: ESOPHAGOGASTRODUODENOSCOPY (EGD);  Surgeon: Ladene Artist, MD,FACG;  Location: [facility] ENDOSCOPY;  Service: Endoscopy;  Laterality: N/A;  . UPPER GASTROINTESTINAL ENDOSCOPY  02/22/2008   w/biopsy, duedenal ulcer, antrum erosions     Current Outpatient Prescriptions  Medication Sig Dispense Refill  . amLODipine  (NORVASC) 10 MG tablet Take 0.5 tablets (5 mg total) by mouth 2 (two) times daily. 30 tablet 5  . atorvastatin (LIPITOR) 40 MG tablet Take 1 tablet (40 mg total) by mouth daily. 90 tablet 1  . B-D UF III MINI PEN NEEDLES 31G X 5 MM MISC USE AS DIRECTED FOR INSULIN 100 each 0  . Cholecalciferol (VITAMIN D) 1000 UNITS capsule Take 1,000 Units by mouth every evening.     . citalopram (CELEXA) 20 MG tablet Take 1 tablet (20 mg total) by mouth daily. 90 tablet 1  . clopidogrel (PLAVIX) 75 MG tablet TAKE 1 TABLET(75 MG) BY MOUTH DAILY 90 tablet 2  . donepezil (ARICEPT) 10 MG tablet Take 1 tablet (10 mg total) by mouth at bedtime. 90 tablet 1  . Ferrous Sulfate (IRON) 325 (65 FE) MG TABS Take 325 mg by mouth 2 (two) times daily.     . furosemide (LASIX) 20 MG tablet TAKE 1 TABLET (20 MG TOTAL) BY MOUTH DAILY PRN.  FOR WEIGHT GAIN >3# IN A 24 HOURS, EDEMA, SOB 30 tablet 2  . HEMATINIC/FOLIC ACID 081-4 MG TABS TAKE 1 TABLET BY MOUTH DAILY 30 each 0  . Insulin Glargine (LANTUS SOLOSTAR) 100 UNIT/ML Solostar Pen Inject 32 Units into the skin daily at 10 pm. Increase by 2 units every 3 days if all fsg>120 15 mL 4  . insulin lispro (HUMALOG KWIKPEN) 100 UNIT/ML KiwkPen Inject 0.05-0.1 mLs (5-10 Units total) into the skin 3 (three) times daily with meals. 15 mL 4  . Insulin Pen Needle (B-D ULTRAFINE III SHORT PEN) 31G X 8 MM MISC Use as directed for insulin 100 each 6  . isosorbide mononitrate (IMDUR) 30 MG 24 hr tablet Take 1 tablet (30 mg total) by mouth daily. 90 tablet 2  . isosorbide mononitrate (IMDUR) 60 MG 24 hr tablet TAKE 1 TABLET BY MOUTH EVERY DAY 90 tablet 0  . LANTUS SOLOSTAR 100 UNIT/ML Solostar Pen ADMINISTER 32 UNITS UNDER THE SKIN DAILY AT 10 PM. INCREASE BY 2 UNITS EVERY 3 DAYS IF ALL FSG>120 15 mL 1  . nitroGLYCERIN (NITROSTAT) 0.4 MG SL tablet Place 0.4 mg under the tongue every 5 (five) minutes as needed for chest pain.    . Omega-3 Fatty Acids (FISH OIL) 1200 MG CAPS Take 1,200 mg by mouth  daily.     . pantoprazole (PROTONIX) 40 MG tablet TAKE 1 TABLET(40 MG) BY MOUTH DAILY 90 tablet 1  . ranitidine (ZANTAC) 300 MG tablet TAKE 1 TABLET BY MOUTH EVERY NIGHT AT BEDTIME 90 tablet 0  . thiamine (VITAMIN B-1) 100 MG tablet Take 1 tablet (100 mg total) by mouth daily. 30 tablet 5  . traMADol-acetaminophen (ULTRACET) 37.5-325 MG tablet Take 1 tablet by mouth every 6 (six) hours as needed for moderate pain or severe pain.    Marland Kitchen triamterene-hydrochlorothiazide (MAXZIDE-25) 37.5-25 MG tablet TAKE 1 TABLET BY MOUTH DAILY 30 tablet 5  No current facility-administered medications for this visit.     Allergies:   Patient has no known allergies.    Social History:  The patient  reports that she quit smoking about 15 years ago. Her smoking use included Cigarettes. She has never used smokeless tobacco. She reports that she does not drink alcohol or use drugs.   Family History:  The patient's family history includes Cancer in her mother; Coronary artery disease in her father; Diabetes in her maternal uncle; Hypertension in her father; Stroke in her father.    ROS:  Please see the history of present illness.   Otherwise, review of systems are positive for none.   All other systems are reviewed and negative.    PHYSICAL EXAM: VS:  There were no vitals taken for this visit. , BMI There is no height or weight on file to calculate BMI. Affect appropriate Healthy:  appears stated age 75: normal Neck supple with no adenopathy JVP normal no bruits no thyromegaly Lungs clear with no wheezing and good diaphragmatic motion Heart:  S1/S2 no murmur, no rub, gallop or click PMI normal Abdomen: benighn, BS positve, no tenderness, no AAA no bruit.  No HSM or HJR Distal pulses intact with no bruits Plus one RLE  edema Neuro non-focal Skin warm and dry No muscular weakness    EKG:   2014 SR rate 56 nonspecific ST changes  12/07/16 SR rate 52 nonspecific ST changes [id number] Normal intervals     Recent Labs: 01/31/2016: Pro B Natriuretic peptide (BNP) 58.0 10/12/2016: ALT 14; BUN 36; Creatinine, Ser 1.77; Hemoglobin 12.5; Platelets 198.0; Potassium 4.4; Sodium 137; TSH 3.15    Lipid Panel    Component Value Date/Time   CHOL 181 10/12/2016 1120   TRIG 264.0 (H) 10/12/2016 1120   HDL 39.60 10/12/2016 1120   CHOLHDL 5 10/12/2016 1120   VLDL 52.8 (H) 10/12/2016 1120   LDLCALC 114 (H) 03/02/2016 1134   LDLDIRECT 94.0 10/12/2016 1120      Wt Readings from Last 3 Encounters:  10/12/16 162 lb 12.8 oz (73.8 kg)  07/06/16 163 lb 3.2 oz (74 kg)  03/02/16 161 lb 6.4 oz (73.2 kg)      Other studies Reviewed: Additional studies/ records that were reviewed today include: CABG notes notes primary Dr Randel Pigg labs ECG.    ASSESSMENT AND PLAN:  1.  Bradycardia benign chronic no indication for pacer d/c Aricept 2. CAD stable no angina given age no indication for f/u stress testing  3. Dementia failed namenda and aricept has good social supports and family 4. Edema f/u echo make sure EF still normal likely venous as it is more unilateral  5. DM Discussed low carb diet.  Target hemoglobin A1c is 6.5 or less.  Continue current medications. 6. Cholesterol on lower dose statin and at goal    Current medicines are reviewed at length with the patient today.  The patient does not have concerns regarding medicines.  The following changes have been made:  D/C Aricept  Labs/ tests ordered today include: Echo  No orders of the defined types were placed in this encounter.    Disposition:   FU with cardiology PRN     Signed, Jenkins Rouge, MD  12/06/2016 9:55 PM    [address], [place], [place]  [address] Phone: [phone]; Fax: [phone]

## 2016-12-07 ENCOUNTER — Ambulatory Visit (INDEPENDENT_AMBULATORY_CARE_PROVIDER_SITE_OTHER): Payer: Medicare Other | Admitting: Cardiovascular Disease

## 2016-12-07 ENCOUNTER — Encounter: Payer: Self-pay | Admitting: Cardiovascular Disease

## 2016-12-07 VITALS — BP 140/58 | HR 52 | Ht 66.0 in | Wt 161.8 lb

## 2016-12-07 DIAGNOSIS — R609 Edema, unspecified: Secondary | ICD-10-CM | POA: Diagnosis not present

## 2016-12-07 DIAGNOSIS — I5032 Chronic diastolic (congestive) heart failure: Secondary | ICD-10-CM

## 2016-12-07 DIAGNOSIS — R001 Bradycardia, unspecified: Secondary | ICD-10-CM | POA: Diagnosis not present

## 2016-12-07 DIAGNOSIS — E782 Mixed hyperlipidemia: Secondary | ICD-10-CM | POA: Diagnosis not present

## 2016-12-07 DIAGNOSIS — I25810 Atherosclerosis of coronary artery bypass graft(s) without angina pectoris: Secondary | ICD-10-CM | POA: Diagnosis not present

## 2016-12-07 NOTE — Patient Instructions (Addendum)

## 2016-12-09 ENCOUNTER — Other Ambulatory Visit: Payer: Self-pay | Admitting: Family Medicine

## 2016-12-17 ENCOUNTER — Other Ambulatory Visit (HOSPITAL_COMMUNITY): Payer: Medicare Other

## 2017-01-02 ENCOUNTER — Encounter: Payer: Self-pay | Admitting: Family Medicine

## 2017-01-06 ENCOUNTER — Ambulatory Visit (HOSPITAL_COMMUNITY): Payer: Medicare Other | Attending: Cardiovascular Disease

## 2017-01-06 ENCOUNTER — Other Ambulatory Visit: Payer: Self-pay

## 2017-01-06 DIAGNOSIS — I5032 Chronic diastolic (congestive) heart failure: Secondary | ICD-10-CM | POA: Diagnosis not present

## 2017-01-06 DIAGNOSIS — I2510 Atherosclerotic heart disease of native coronary artery without angina pectoris: Secondary | ICD-10-CM | POA: Diagnosis not present

## 2017-01-06 DIAGNOSIS — E1151 Type 2 diabetes mellitus with diabetic peripheral angiopathy without gangrene: Secondary | ICD-10-CM | POA: Diagnosis not present

## 2017-01-06 DIAGNOSIS — R609 Edema, unspecified: Secondary | ICD-10-CM | POA: Insufficient documentation

## 2017-01-06 DIAGNOSIS — I130 Hypertensive heart and chronic kidney disease with heart failure and stage 1 through stage 4 chronic kidney disease, or unspecified chronic kidney disease: Secondary | ICD-10-CM | POA: Insufficient documentation

## 2017-01-06 DIAGNOSIS — N189 Chronic kidney disease, unspecified: Secondary | ICD-10-CM | POA: Insufficient documentation

## 2017-01-06 DIAGNOSIS — E1122 Type 2 diabetes mellitus with diabetic chronic kidney disease: Secondary | ICD-10-CM | POA: Diagnosis not present

## 2017-01-06 DIAGNOSIS — E785 Hyperlipidemia, unspecified: Secondary | ICD-10-CM | POA: Insufficient documentation

## 2017-01-06 DIAGNOSIS — I5031 Acute diastolic (congestive) heart failure: Secondary | ICD-10-CM | POA: Diagnosis present

## 2017-01-07 ENCOUNTER — Telehealth: Payer: Self-pay | Admitting: Cardiovascular Disease

## 2017-01-07 NOTE — Telephone Encounter (Signed)
°  Follow Up  Calling to follow up on echo results. Please call.

## 2017-01-07 NOTE — Telephone Encounter (Signed)
Patient's son (DPR) called back about echo results. Per Dr. Johnsie Cancel, EF normal no bad valves overall good. Patient's son verbalized understanding.

## 2017-01-14 ENCOUNTER — Ambulatory Visit: Payer: Medicare Other | Admitting: Family Medicine

## 2017-01-26 ENCOUNTER — Ambulatory Visit (INDEPENDENT_AMBULATORY_CARE_PROVIDER_SITE_OTHER): Payer: Medicare Other | Admitting: Family Medicine

## 2017-01-26 ENCOUNTER — Encounter: Payer: Self-pay | Admitting: Family Medicine

## 2017-01-26 VITALS — BP 130/80 | HR 92 | Temp 97.7°F | Resp 16 | Wt 160.6 lb

## 2017-01-26 DIAGNOSIS — I10 Essential (primary) hypertension: Secondary | ICD-10-CM

## 2017-01-26 DIAGNOSIS — N390 Urinary tract infection, site not specified: Secondary | ICD-10-CM | POA: Diagnosis not present

## 2017-01-26 DIAGNOSIS — J329 Chronic sinusitis, unspecified: Secondary | ICD-10-CM

## 2017-01-26 DIAGNOSIS — E119 Type 2 diabetes mellitus without complications: Secondary | ICD-10-CM | POA: Diagnosis not present

## 2017-01-26 DIAGNOSIS — E1140 Type 2 diabetes mellitus with diabetic neuropathy, unspecified: Secondary | ICD-10-CM | POA: Diagnosis not present

## 2017-01-26 DIAGNOSIS — E782 Mixed hyperlipidemia: Secondary | ICD-10-CM | POA: Diagnosis not present

## 2017-01-26 DIAGNOSIS — Z794 Long term (current) use of insulin: Secondary | ICD-10-CM | POA: Diagnosis not present

## 2017-01-26 DIAGNOSIS — E519 Thiamine deficiency, unspecified: Secondary | ICD-10-CM

## 2017-01-26 LAB — COMPREHENSIVE METABOLIC PANEL
ALBUMIN: 3.9 g/dL (ref 3.5–5.2)
ALK PHOS: 66 U/L (ref 39–117)
ALT: 12 U/L (ref 0–35)
AST: 12 U/L (ref 0–37)
BILIRUBIN TOTAL: 1 mg/dL (ref 0.2–1.2)
BUN: 46 mg/dL — ABNORMAL HIGH (ref 6–23)
CALCIUM: 9.8 mg/dL (ref 8.4–10.5)
CO2: 28 mEq/L (ref 19–32)
CREATININE: 1.86 mg/dL — AB (ref 0.40–1.20)
Chloride: 103 mEq/L (ref 96–112)
GFR: 27.45 mL/min — AB (ref >60.00)
Glucose, Bld: 230 mg/dL — ABNORMAL HIGH (ref 70–99)
Potassium: 4.7 mEq/L (ref 3.5–5.1)
Sodium: 138 mEq/L (ref 135–145)
Total Protein: 7.3 g/dL (ref 6.0–8.3)

## 2017-01-26 LAB — HEMOGLOBIN A1C: Hgb A1c MFr Bld: 8.4 % — ABNORMAL HIGH (ref 4.6–6.5)

## 2017-01-26 LAB — URINALYSIS, ROUTINE W REFLEX MICROSCOPIC
Bilirubin Urine: NEGATIVE
KETONES UR: NEGATIVE
Nitrite: POSITIVE — AB
SPECIFIC GRAVITY, URINE: 1.025 (ref 1.000–1.030)
TOTAL PROTEIN, URINE-UPE24: 100 — AB
URINE GLUCOSE: 100 — AB
UROBILINOGEN UA: 0.2 (ref 0.0–1.0)
pH: 6 (ref 5.0–8.0)

## 2017-01-26 LAB — CBC WITH DIFFERENTIAL/PLATELET
BASOS ABS: 0 10*3/uL (ref 0.0–0.1)
BASOS PCT: 0.5 % (ref 0.0–3.0)
Eosinophils Absolute: 0.2 10*3/uL (ref 0.0–0.7)
Eosinophils Relative: 2.6 % (ref 0.0–5.0)
HEMATOCRIT: 38.2 % (ref 36.0–46.0)
HEMOGLOBIN: 12.9 g/dL (ref 12.0–15.0)
LYMPHS PCT: 25.2 % (ref 12.0–46.0)
Lymphs Abs: 2.1 10*3/uL (ref 0.7–4.0)
MCHC: 33.6 g/dL (ref 30.0–36.0)
MCV: 93.4 fl (ref 78.0–100.0)
MONOS PCT: 8 % (ref 3.0–12.0)
Monocytes Absolute: 0.7 10*3/uL (ref 0.1–1.0)
NEUTROS ABS: 5.4 10*3/uL (ref 1.4–7.7)
Neutrophils Relative %: 63.7 % (ref 43.0–77.0)
Platelets: 209 10*3/uL (ref 150.0–400.0)
RBC: 4.09 Mil/uL (ref 3.87–5.11)
RDW: 13.3 % (ref 11.5–15.5)
WBC: 8.4 10*3/uL (ref 4.0–10.5)

## 2017-01-26 LAB — LIPID PANEL
CHOLESTEROL: 214 mg/dL — AB (ref 0–200)
HDL: 48.7 mg/dL (ref >39.00)
NonHDL: 165.56
TRIGLYCERIDES: 204 mg/dL — AB (ref 0.0–149.0)
Total CHOL/HDL Ratio: 4
VLDL: 40.8 mg/dL — ABNORMAL HIGH (ref 0.0–40.0)

## 2017-01-26 LAB — LDL CHOLESTEROL, DIRECT: LDL DIRECT: 128 mg/dL

## 2017-01-26 LAB — TSH: TSH: 2.03 u[IU]/mL (ref 0.35–4.50)

## 2017-01-26 NOTE — Assessment & Plan Note (Signed)
Check level today 

## 2017-01-26 NOTE — Assessment & Plan Note (Signed)
Check UA and urine culture.

## 2017-01-26 NOTE — Patient Instructions (Signed)
Can increase Lantus by 2 units every 3 days if no blood sugars below 100 and if concerned call Carbohydrate Counting for Diabetes Mellitus, Adult Carbohydrate counting is a method for keeping track of how many carbohydrates you eat. Eating carbohydrates naturally increases the amount of sugar (glucose) in the blood. Counting how many carbohydrates you eat helps keep your blood glucose within normal limits, which helps you manage your diabetes (diabetes mellitus). It is important to know how many carbohydrates you can safely have in each meal. This is different for every person. A diet and nutrition specialist (registered dietitian) can help you make a meal plan and calculate how many carbohydrates you should have at each meal and snack. Carbohydrates are found in the following foods:  Grains, such as breads and cereals.  Dried beans and soy products.  Starchy vegetables, such as potatoes, peas, and corn.  Fruit and fruit juices.  Milk and yogurt.  Sweets and snack foods, such as cake, cookies, candy, chips, and soft drinks.  How do I count carbohydrates? There are two ways to count carbohydrates in food. You can use either of the methods or a combination of both. Reading "Nutrition Facts" on packaged food The "Nutrition Facts" list is included on the labels of almost all packaged foods and beverages in the [place] It includes:  The serving size.  Information about nutrients in each serving, including the grams (g) of carbohydrate per serving.  To use the "Nutrition Facts":  Decide how many servings you will have.  Multiply the number of servings by the number of carbohydrates per serving.  The resulting number is the total amount of carbohydrates that you will be having.  Learning standard serving sizes of other foods When you eat foods containing carbohydrates that are not packaged or do not include "Nutrition Facts" on the label, you need to measure the servings in order to count  the amount of carbohydrates:  Measure the foods that you will eat with a food scale or measuring cup, if needed.  Decide how many standard-size servings you will eat.  Multiply the number of servings by 15. Most carbohydrate-rich foods have about 15 g of carbohydrates per serving. ? For example, if you eat 8 oz (170 g) of strawberries, you will have eaten 2 servings and 30 g of carbohydrates (2 servings x 15 g = 30 g).  For foods that have more than one food mixed, such as soups and casseroles, you must count the carbohydrates in each food that is included.  The following list contains standard serving sizes of common carbohydrate-rich foods. Each of these servings has about 15 g of carbohydrates:   hamburger bun or  English muffin.   oz (15 mL) syrup.   oz (14 g) jelly.  1 slice of bread.  1 six-inch tortilla.  3 oz (85 g) cooked rice or pasta.  4 oz (113 g) cooked dried beans.  4 oz (113 g) starchy vegetable, such as peas, corn, or potatoes.  4 oz (113 g) hot cereal.  4 oz (113 g) mashed potatoes or  of a large baked potato.  4 oz (113 g) canned or frozen fruit.  4 oz (120 mL) fruit juice.  4-6 crackers.  6 chicken nuggets.  6 oz (170 g) unsweetened dry cereal.  6 oz (170 g) plain fat-free yogurt or yogurt sweetened with artificial sweeteners.  8 oz (240 mL) milk.  8 oz (170 g) fresh fruit or one small piece of fruit.  24  oz (680 g) popped popcorn.  Example of carbohydrate counting Sample meal  3 oz (85 g) chicken breast.  6 oz (170 g) brown rice.  4 oz (113 g) corn.  8 oz (240 mL) milk.  8 oz (170 g) strawberries with sugar-free whipped topping. Carbohydrate calculation 1. Identify the foods that contain carbohydrates: ? Rice. ? Corn. ? Milk. ? Strawberries. 2. Calculate how many servings you have of each food: ? 2 servings rice. ? 1 serving corn. ? 1 serving milk. ? 1 serving strawberries. 3. Multiply each number of servings by 15  g: ? 2 servings rice x 15 g = 30 g. ? 1 serving corn x 15 g = 15 g. ? 1 serving milk x 15 g = 15 g. ? 1 serving strawberries x 15 g = 15 g. 4. Add together all of the amounts to find the total grams of carbohydrates eaten: ? 30 g + 15 g + 15 g + 15 g = 75 g of carbohydrates total. This information is not intended to replace advice given to you by your health care provider. Make sure you discuss any questions you have with your health care provider. Document Released: 03/02/2005 Document Revised: 09/20/2015 Document Reviewed: 08/14/2015 Elsevier Interactive Patient Education  [employer].

## 2017-01-26 NOTE — Progress Notes (Signed)
Subjective:  I acted as a Education administrator for [employer]. [name], [place]   Patient ID: Destiny [name], female    DOB: August 28, 1931, 83 y.o.   MRN: [id number]  Chief Complaint  Patient presents with  . Follow-up    HPI  Patient is in today for follow up visit and is accompanied by a family member. She feels well today. No recent febrile illness or recent hospitalizations. No polyuria or polydipsia. She had one episode of some intense abdominal pain that required her to go home but it has not recurred. Her bowels are moving well. She is eating well. No bloody or tarry stool. She is noting fatigue, headache, head congestion for past week. Is also noting some urinary frequency and some dysuria over past couple of days. Denies CP/palp/SOBfevers/GI c/o. Taking meds as prescribed  Patient Care Team: Mosie Lukes, MD as PCP - General (Family Medicine)   Past Medical History:  Diagnosis Date  . Acute renal insufficiency 12/24/2013  . Amaurosis fugax   . Arthritis 12/06/2012  . Atherosclerosis   . CAD (coronary artery disease)    a. s/p CABG x 4 (LIMA->LAD, VG->Diag, VG->OM1->OM3);  b. 2010 Cath: 3/4 patent grafts (VG->diag occluded), 3vd, sev PVD ->Med Rx;  c. 12/2011 Lexi MV: sm area of mild mid and apical ant wall ischemia ->med rx.  . Carotid bruit   . Chronic kidney disease (CKD) stage G4/A1, severely decreased glomerular filtration rate (GFR) between 15-29 mL/min/1.73 square meter and albuminuria creatinine ratio less than 30 mg/g (HCC)    GFR 25 on 12/25/13  . CVA (cerebral infarction)   . Dementia    pt denies  . Depression 12/24/2013  . Diabetes mellitus type II    insulin dependent.   . Diabetic peripheral neuropathy ([place])   . Diabetic retinopathy   . Diverticulosis   . Duodenal ulcer 2009 and 11/2011   caused GI bleeding  . Gallstones 2009   seen on CT scan  . GI bleed 04/2011, 11/2011   found non-bleeding duodenal ulcers  . Hyperlipidemia   . Hypersomnolence 03/12/2013  .  Hypertension   . Iron deficiency anemia secondary to blood loss (chronic)   . Melena 1015/2015   PEPTIC ULCER    REQUIRING TRANSFUSION  . Pedal edema 10/12/2016  . PVD (peripheral vascular disease) ([place])   . Rib fracture 04/21/2015  . Thiamine deficiency 08/13/2015  . UTI (urinary tract infection) 04/08/2014  . Weight loss, non-intentional 01/29/2014    Past Surgical History:  Procedure Laterality Date  . ABDOMINAL HYSTERECTOMY    . COLONOSCOPY N/A 05/20/2011   Performed by Irene Shipper, MD at [place]  . CORONARY ARTERY BYPASS GRAFT  1999   x 4  . ENTEROSCOPY N/A 12/29/2013   Performed by Inda Castle, MD at [place]  . ESOPHAGOGASTRODUODENOSCOPY (EGD) N/A 12/08/2011   Performed by Ladene Artist, MD,FACG at [facility] ENDOSCOPY  . ESOPHAGOGASTRODUODENOSCOPY (EGD) N/A 05/16/2011   Performed by Gatha Mayer, MD at [place]  . UPPER GASTROINTESTINAL ENDOSCOPY  02/22/2008   w/biopsy, duedenal ulcer, antrum erosions    Family History  Problem Relation Age of Onset  . Coronary artery disease Father   . Hypertension Father   . Stroke Father   . Cancer Mother        ?  . Diabetes Maternal Uncle   . Colon cancer Neg Hx     Social History   Socioeconomic History  . Marital status: Widowed  Spouse name: Not on file  . Number of children: 4  . Years of education: Not on file  . Highest education level: Not on file  Social Needs  . Financial resource strain: Not on file  . Food insecurity - worry: Not on file  . Food insecurity - inability: Not on file  . Transportation needs - medical: Not on file  . Transportation needs - non-medical: Not on file  Occupational History  . Occupation: retired    Fish farm manager: RETIRED  Tobacco Use  . Smoking status: Former Smoker    Types: Cigarettes    Last attempt to quit: 12/31/2000    Years since quitting: 16.0  . Smokeless tobacco: Never Used  Substance and Sexual Activity  . Alcohol use: No  . Drug use: No  . Sexual  activity: Not on file  Other Topics Concern  . Not on file  Social History Narrative   Widowed - husband passed away in May 25, 2009   lives with daughter with epilepsy - mental retardation   Retired     Former Smoker      Alcohol use-no          Occupation: Retired       son - Halina Asano     Outpatient Medications Prior to Visit  Medication Sig Dispense Refill  . amLODipine (NORVASC) 10 MG tablet Take 5 mg by mouth daily.    Marland Kitchen atorvastatin (LIPITOR) 40 MG tablet Take 20 mg by mouth daily.    . B-D UF III MINI PEN NEEDLES 31G X 5 MM MISC USE AS DIRECTED FOR INSULIN 100 each 0  . Cholecalciferol (VITAMIN D) 1000 UNITS capsule Take 1,000 Units by mouth every evening.     . citalopram (CELEXA) 20 MG tablet Take 1 tablet (20 mg total) by mouth daily. 90 tablet 1  . clopidogrel (PLAVIX) 75 MG tablet TAKE 1 TABLET(75 MG) BY MOUTH DAILY 90 tablet 2  . donepezil (ARICEPT) 10 MG tablet Take 1 tablet (10 mg total) by mouth at bedtime. 90 tablet 1  . furosemide (LASIX) 20 MG tablet TAKE 1 TABLET (20 MG TOTAL) BY MOUTH DAILY PRN.  FOR WEIGHT GAIN >3# IN A 24 HOURS, EDEMA, SOB 30 tablet 2  . HEMATINIC/FOLIC ACID 638-7 MG TABS TAKE 1 TABLET BY MOUTH DAILY 30 each 0  . insulin lispro (HUMALOG KWIKPEN) 100 UNIT/ML KiwkPen Inject 0.05-0.1 mLs (5-10 Units total) into the skin 3 (three) times daily with meals. 15 mL 4  . Insulin Pen Needle (B-D ULTRAFINE III SHORT PEN) 31G X 8 MM MISC Use as directed for insulin 100 each 6  . isosorbide mononitrate (IMDUR) 30 MG 24 hr tablet Take 1 tablet (30 mg total) by mouth daily. 90 tablet 2  . isosorbide mononitrate (IMDUR) 60 MG 24 hr tablet TAKE 1 TABLET BY MOUTH EVERY DAY 90 tablet 0  . LANTUS SOLOSTAR 100 UNIT/ML Solostar Pen ADMINISTER 32 UNITS UNDER THE SKIN DAILY AT 10 PM. INCREASE BY 2 UNITS EVERY 3 DAYS IF ALL FSG>120 15 mL 1  . nitroGLYCERIN (NITROSTAT) 0.4 MG SL tablet Place 0.4 mg under the tongue every 5 (five) minutes as needed for chest pain.    .  Omega-3 Fatty Acids (FISH OIL) 1200 MG CAPS Take 1,200 mg by mouth daily.     . pantoprazole (PROTONIX) 40 MG tablet Take 20 mg by mouth every Monday, Wednesday, and Friday.    . ranitidine (ZANTAC) 300 MG tablet TAKE 1 TABLET BY MOUTH EVERY  NIGHT AT BEDTIME 90 tablet 0  . thiamine (VITAMIN B-1) 100 MG tablet Take 1 tablet (100 mg total) by mouth daily. 30 tablet 5  . triamterene-hydrochlorothiazide (MAXZIDE-25) 37.5-25 MG tablet Take 0.5 tablets by mouth daily.     No facility-administered medications prior to visit.     No Known Allergies  Review of Systems  Constitutional: Negative for fever and malaise/fatigue.  HENT: Negative for congestion.   Respiratory: Negative for cough and shortness of breath.   Cardiovascular: Negative for chest pain and palpitations.  Gastrointestinal: Negative for vomiting.  Musculoskeletal: Negative for back pain.  Skin: Negative for rash.  Neurological: Negative for loss of consciousness and headaches.  Psychiatric/Behavioral: Positive for memory loss. The patient is not nervous/anxious.        Objective:    Physical Exam  Constitutional: She is oriented to person, place, and time. She appears well-developed and well-nourished. No distress.  HENT:  Head: Normocephalic and atraumatic.  Nose: Nose normal.  Eyes: Right eye exhibits no discharge. Left eye exhibits no discharge.  Neck: Normal range of motion. Neck supple.  Cardiovascular: Normal rate and regular rhythm.  Pulmonary/Chest: Effort normal and breath sounds normal.  Abdominal: Soft. Bowel sounds are normal. There is no tenderness.  Musculoskeletal: She exhibits no edema.  Neurological: She is alert and oriented to person, place, and time.  Skin: Skin is warm and dry.  Psychiatric: She has a normal mood and affect.  Nursing note and vitals reviewed.   BP 130/80   Pulse 92   Temp 97.7 F (36.5 C) (Oral)   Resp 16   Wt 160 lb 9.6 oz (72.8 kg)   SpO2 100%   BMI 25.92 kg/m  Wt  Readings from Last 3 Encounters:  01/26/17 160 lb 9.6 oz (72.8 kg)  12/07/16 161 lb 12.8 oz (73.4 kg)  10/12/16 162 lb 12.8 oz (73.8 kg)   BP Readings from Last 3 Encounters:  01/26/17 130/80  12/07/16 (!) 140/58  10/12/16 (!) 157/59     Immunization History  Administered Date(s) Administered  . Influenza Split 12/07/2011  . Influenza Whole 03/28/2007, 01/09/2008, 12/25/2008, 11/27/2009  . Influenza, High Dose Seasonal PF 01/05/2015, 11/26/2015  . Influenza,inj,Quad PF,6+ Mos 12/06/2012, 12/29/2013  . Influenza-Unspecified 01/05/2015, 12/26/2016  . Pneumococcal Polysaccharide-23 01/10/2008, 11/15/2012  . Td 07/04/2007    Health Maintenance  Topic Date Due  . DEXA SCAN  04/17/1998  . PNA vac Low Risk Adult (2 of 2 - PCV13) 11/15/2013  . OPHTHALMOLOGY EXAM  03/18/2016  . URINE MICROALBUMIN  11/25/2016  . TETANUS/TDAP  07/03/2017  . FOOT EXAM  07/06/2017  . HEMOGLOBIN A1C  07/26/2017  . INFLUENZA VACCINE  Completed    Lab Results  Component Value Date   WBC 8.4 01/26/2017   HGB 12.9 01/26/2017   HCT 38.2 01/26/2017   PLT 209.0 01/26/2017   GLUCOSE 230 (H) 01/26/2017   CHOL 214 (H) 01/26/2017   TRIG 204.0 (H) 01/26/2017   HDL 48.70 01/26/2017   LDLDIRECT 128.0 01/26/2017   LDLCALC 114 (H) 03/02/2016   ALT 12 01/26/2017   AST 12 01/26/2017   NA 138 01/26/2017   K 4.7 01/26/2017   CL 103 01/26/2017   CREATININE 1.86 (H) 01/26/2017   BUN 46 (H) 01/26/2017   CO2 28 01/26/2017   TSH 2.03 01/26/2017   INR 1.27 12/28/2013   HGBA1C 8.4 (H) 01/26/2017   MICROALBUR 97.4 (H) 11/26/2015    Lab Results  Component Value Date   TSH 2.03  01/26/2017   Lab Results  Component Value Date   WBC 8.4 01/26/2017   HGB 12.9 01/26/2017   HCT 38.2 01/26/2017   MCV 93.4 01/26/2017   PLT 209.0 01/26/2017   Lab Results  Component Value Date   NA 138 01/26/2017   K 4.7 01/26/2017   CO2 28 01/26/2017   GLUCOSE 230 (H) 01/26/2017   BUN 46 (H) 01/26/2017   CREATININE 1.86 (H)  01/26/2017   BILITOT 1.0 01/26/2017   ALKPHOS 66 01/26/2017   AST 12 01/26/2017   ALT 12 01/26/2017   PROT 7.3 01/26/2017   ALBUMIN 3.9 01/26/2017   CALCIUM 9.8 01/26/2017   ANIONGAP 12 12/29/2013   GFR 27.45 (L) 01/26/2017   Lab Results  Component Value Date   CHOL 214 (H) 01/26/2017   Lab Results  Component Value Date   HDL 48.70 01/26/2017   Lab Results  Component Value Date   LDLCALC 114 (H) 03/02/2016   Lab Results  Component Value Date   TRIG 204.0 (H) 01/26/2017   Lab Results  Component Value Date   CHOLHDL 4 01/26/2017   Lab Results  Component Value Date   HGBA1C 8.4 (H) 01/26/2017         Assessment & Plan:   Problem List Items Addressed This Visit    Diabetes mellitus type 2, insulin dependent ([place]) (Chronic)    hgba1c unacceptable last check, will recheck today, minimize simple carbs. Increase exercise as tolerated. Continue current meds. Sugars up recently with Halloween, was in the 200s prior to Halloween at times but normally in the 100s      Relevant Orders   Hemoglobin A1c (Completed)   Type 2 diabetes mellitus with diabetic neuropathy, unspecified ([place])    No escalation or new complaints. No changes today      Hyperlipidemia    Encouraged heart healthy diet, increase exercise, avoid trans fats, consider a krill oil cap daily      Relevant Orders   Lipid panel (Completed)   LDL cholesterol, direct (Completed)   Essential hypertension    Well controlled, no changes to meds. Encouraged heart healthy diet such as the DASH diet and exercise as tolerated.       UTI (urinary tract infection)    Check UA and urine culture      Relevant Medications   cefdinir (OMNICEF) 300 MG capsule   Other Relevant Orders   Urinalysis   Urine Culture (Completed)   Urinalysis, Routine w reflex microscopic (Completed)   Thiamine deficiency    Check level today      Relevant Orders   Vitamin B1   Sinusitis    Started on Cefdinir and Mucinex bid        Relevant Medications   cefdinir (OMNICEF) 300 MG capsule    Other Visit Diagnoses    Hypertension, unspecified type    -  Primary   Relevant Orders   CBC with Differential/Platelet (Completed)   Comprehensive metabolic panel (Completed)   TSH (Completed)      I am having Destiny [name] start on cefdinir. I am also having her maintain her Vitamin D, Fish Oil, nitroGLYCERIN, Insulin Pen Needle, thiamine, insulin lispro, B-D UF III MINI PEN NEEDLES, HEMATINIC/FOLIC ACID, clopidogrel, furosemide, ranitidine, isosorbide mononitrate, donepezil, citalopram, LANTUS SOLOSTAR, amLODipine, atorvastatin, pantoprazole, triamterene-hydrochlorothiazide, and isosorbide mononitrate.  Meds ordered this encounter  Medications  . cefdinir (OMNICEF) 300 MG capsule    Sig: Take 1 capsule (300 mg total) 2 (two) times daily by  mouth.    Dispense:  10 capsule    Refill:  0    CMA served as scribe during this visit. History, Physical and Plan performed by medical provider. Documentation and orders reviewed and attested to.  Penni Homans, MD

## 2017-01-26 NOTE — Assessment & Plan Note (Signed)
hgba1c unacceptable last check, will recheck today, minimize simple carbs. Increase exercise as tolerated. Continue current meds. Sugars up recently with Halloween, was in the 200s prior to Halloween at times but normally in the 100s

## 2017-01-26 NOTE — Assessment & Plan Note (Signed)
Encouraged heart healthy diet, increase exercise, avoid trans fats, consider a krill oil cap daily 

## 2017-01-28 LAB — URINE CULTURE
MICRO NUMBER: 81277575
SPECIMEN QUALITY: ADEQUATE

## 2017-01-29 MED ORDER — CEFDINIR 300 MG PO CAPS: 300.0000 mg | ORAL_CAPSULE | Freq: Two times a day (BID) | ORAL | 0 refills | Status: DC

## 2017-01-29 MED FILL — CEFDINIR 300 MG CAPSULE: 300 | 5 days supply | Qty: 10 | Fill #0

## 2017-01-31 DIAGNOSIS — J329 Chronic sinusitis, unspecified: Secondary | ICD-10-CM | POA: Insufficient documentation

## 2017-01-31 NOTE — Assessment & Plan Note (Signed)
No escalation or new complaints. No changes today

## 2017-01-31 NOTE — Assessment & Plan Note (Signed)
Started on Cefdinir and Mucinex bid

## 2017-01-31 NOTE — Assessment & Plan Note (Signed)
Well controlled, no changes to meds. Encouraged heart healthy diet such as the DASH diet and exercise as tolerated.  °

## 2017-02-01 ENCOUNTER — Inpatient Hospital Stay (HOSPITAL_COMMUNITY)
Admission: EM | Admit: 2017-02-01 | Discharge: 2017-02-03 | DRG: 062 | Disposition: A | Discharge status: Home Health | Payer: Medicare Other | Attending: Neurology | Admitting: Neurology

## 2017-02-01 ENCOUNTER — Emergency Department (HOSPITAL_COMMUNITY): Payer: Medicare Other

## 2017-02-01 ENCOUNTER — Encounter (HOSPITAL_COMMUNITY): Payer: Self-pay

## 2017-02-01 DIAGNOSIS — I2510 Atherosclerotic heart disease of native coronary artery without angina pectoris: Secondary | ICD-10-CM | POA: Diagnosis present

## 2017-02-01 DIAGNOSIS — R4701 Aphasia: Secondary | ICD-10-CM | POA: Diagnosis not present

## 2017-02-01 DIAGNOSIS — R29703 NIHSS score 3: Secondary | ICD-10-CM | POA: Diagnosis not present

## 2017-02-01 DIAGNOSIS — N184 Chronic kidney disease, stage 4 (severe): Secondary | ICD-10-CM | POA: Diagnosis not present

## 2017-02-01 DIAGNOSIS — R569 Unspecified convulsions: Secondary | ICD-10-CM | POA: Diagnosis not present

## 2017-02-01 DIAGNOSIS — E1122 Type 2 diabetes mellitus with diabetic chronic kidney disease: Secondary | ICD-10-CM | POA: Diagnosis not present

## 2017-02-01 DIAGNOSIS — N183 Chronic kidney disease, stage 3 (moderate): Secondary | ICD-10-CM | POA: Diagnosis present

## 2017-02-01 DIAGNOSIS — Z951 Presence of aortocoronary bypass graft: Secondary | ICD-10-CM | POA: Diagnosis not present

## 2017-02-01 DIAGNOSIS — E785 Hyperlipidemia, unspecified: Secondary | ICD-10-CM | POA: Diagnosis present

## 2017-02-01 DIAGNOSIS — F329 Major depressive disorder, single episode, unspecified: Secondary | ICD-10-CM | POA: Diagnosis present

## 2017-02-01 DIAGNOSIS — Z8673 Personal history of transient ischemic attack (TIA), and cerebral infarction without residual deficits: Secondary | ICD-10-CM | POA: Diagnosis not present

## 2017-02-01 DIAGNOSIS — R29818 Other symptoms and signs involving the nervous system: Secondary | ICD-10-CM | POA: Diagnosis not present

## 2017-02-01 DIAGNOSIS — E11319 Type 2 diabetes mellitus with unspecified diabetic retinopathy without macular edema: Secondary | ICD-10-CM | POA: Diagnosis present

## 2017-02-01 DIAGNOSIS — Z823 Family history of stroke: Secondary | ICD-10-CM | POA: Diagnosis not present

## 2017-02-01 DIAGNOSIS — E1151 Type 2 diabetes mellitus with diabetic peripheral angiopathy without gangrene: Secondary | ICD-10-CM | POA: Diagnosis present

## 2017-02-01 DIAGNOSIS — I672 Cerebral atherosclerosis: Secondary | ICD-10-CM | POA: Diagnosis not present

## 2017-02-01 DIAGNOSIS — I129 Hypertensive chronic kidney disease with stage 1 through stage 4 chronic kidney disease, or unspecified chronic kidney disease: Secondary | ICD-10-CM | POA: Diagnosis present

## 2017-02-01 DIAGNOSIS — Z8249 Family history of ischemic heart disease and other diseases of the circulatory system: Secondary | ICD-10-CM | POA: Diagnosis not present

## 2017-02-01 DIAGNOSIS — J329 Chronic sinusitis, unspecified: Secondary | ICD-10-CM | POA: Diagnosis not present

## 2017-02-01 DIAGNOSIS — Z833 Family history of diabetes mellitus: Secondary | ICD-10-CM

## 2017-02-01 DIAGNOSIS — E1159 Type 2 diabetes mellitus with other circulatory complications: Secondary | ICD-10-CM | POA: Diagnosis not present

## 2017-02-01 DIAGNOSIS — N390 Urinary tract infection, site not specified: Secondary | ICD-10-CM | POA: Diagnosis not present

## 2017-02-01 DIAGNOSIS — F0390 Unspecified dementia without behavioral disturbance: Secondary | ICD-10-CM | POA: Diagnosis present

## 2017-02-01 DIAGNOSIS — I639 Cerebral infarction, unspecified: Secondary | ICD-10-CM | POA: Diagnosis not present

## 2017-02-01 DIAGNOSIS — G459 Transient cerebral ischemic attack, unspecified: Secondary | ICD-10-CM | POA: Diagnosis not present

## 2017-02-01 DIAGNOSIS — I6789 Other cerebrovascular disease: Secondary | ICD-10-CM | POA: Diagnosis not present

## 2017-02-01 DIAGNOSIS — E1142 Type 2 diabetes mellitus with diabetic polyneuropathy: Secondary | ICD-10-CM | POA: Diagnosis present

## 2017-02-01 DIAGNOSIS — I10 Essential (primary) hypertension: Secondary | ICD-10-CM | POA: Diagnosis not present

## 2017-02-01 DIAGNOSIS — Z794 Long term (current) use of insulin: Secondary | ICD-10-CM

## 2017-02-01 DIAGNOSIS — N3000 Acute cystitis without hematuria: Secondary | ICD-10-CM | POA: Diagnosis not present

## 2017-02-01 DIAGNOSIS — Z87891 Personal history of nicotine dependence: Secondary | ICD-10-CM

## 2017-02-01 DIAGNOSIS — Z90710 Acquired absence of both cervix and uterus: Secondary | ICD-10-CM

## 2017-02-01 DIAGNOSIS — I6522 Occlusion and stenosis of left carotid artery: Secondary | ICD-10-CM | POA: Diagnosis not present

## 2017-02-01 LAB — I-STAT CHEM 8, ED
BUN: 47 mg/dL — ABNORMAL HIGH (ref 6–20)
CREATININE: 2.3 mg/dL — AB (ref 0.44–1.00)
Calcium, Ion: 1.14 mmol/L — ABNORMAL LOW (ref 1.15–1.40)
Chloride: 106 mmol/L (ref 101–111)
Glucose, Bld: 179 mg/dL — ABNORMAL HIGH (ref 65–99)
HEMATOCRIT: 34 % — AB (ref 36.0–46.0)
HEMOGLOBIN: 11.6 g/dL — AB (ref 12.0–15.0)
POTASSIUM: 3.7 mmol/L (ref 3.5–5.1)
SODIUM: 140 mmol/L (ref 135–145)
TCO2: 24 mmol/L (ref 22–32)

## 2017-02-01 LAB — COMPREHENSIVE METABOLIC PANEL
ALT: 13 U/L — ABNORMAL LOW (ref 14–54)
AST: 18 U/L (ref 15–41)
Albumin: 3.5 g/dL (ref 3.5–5.0)
Alkaline Phosphatase: 70 U/L (ref 38–126)
Anion gap: 9 (ref 5–15)
BUN: 51 mg/dL — ABNORMAL HIGH (ref 6–20)
CHLORIDE: 105 mmol/L (ref 101–111)
CO2: 24 mmol/L (ref 22–32)
Calcium: 9.2 mg/dL (ref 8.9–10.3)
Creatinine, Ser: 2.37 mg/dL — ABNORMAL HIGH (ref 0.44–1.00)
GFR, EST AFRICAN AMERICAN: 21 mL/min — AB (ref >60)
GFR, EST NON AFRICAN AMERICAN: 18 mL/min — AB (ref >60)
Glucose, Bld: 183 mg/dL — ABNORMAL HIGH (ref 65–99)
POTASSIUM: 3.7 mmol/L (ref 3.5–5.1)
SODIUM: 138 mmol/L (ref 135–145)
Total Bilirubin: 0.8 mg/dL (ref 0.3–1.2)
Total Protein: 6.7 g/dL (ref 6.5–8.1)

## 2017-02-01 LAB — PROTIME-INR
INR: 1.1
PROTHROMBIN TIME: 14.1 s (ref 11.4–15.2)

## 2017-02-01 LAB — APTT: APTT: 31 s (ref 24–36)

## 2017-02-01 LAB — DIFFERENTIAL
BASOS PCT: 0 %
Basophils Absolute: 0 10*3/uL (ref 0.0–0.1)
EOS ABS: 0.3 10*3/uL (ref 0.0–0.7)
Eosinophils Relative: 3 %
Lymphocytes Relative: 29 %
Lymphs Abs: 2.8 10*3/uL (ref 0.7–4.0)
MONO ABS: 0.7 10*3/uL (ref 0.1–1.0)
MONOS PCT: 8 %
Neutro Abs: 5.8 10*3/uL (ref 1.7–7.7)
Neutrophils Relative %: 60 %

## 2017-02-01 LAB — VITAMIN B1: Vitamin B1 (Thiamine): 76 nmol/L — ABNORMAL HIGH (ref 8–30)

## 2017-02-01 LAB — I-STAT TROPONIN, ED: TROPONIN I, POC: 0.01 ng/mL (ref 0.00–0.08)

## 2017-02-01 LAB — CBC
HEMATOCRIT: 35.5 % — AB (ref 36.0–46.0)
Hemoglobin: 11.9 g/dL — ABNORMAL LOW (ref 12.0–15.0)
MCH: 30.8 pg (ref 26.0–34.0)
MCHC: 33.5 g/dL (ref 30.0–36.0)
MCV: 92 fL (ref 78.0–100.0)
PLATELETS: 194 10*3/uL (ref 150–400)
RBC: 3.86 MIL/uL — AB (ref 3.87–5.11)
RDW: 13 % (ref 11.5–15.5)
WBC: 9.7 10*3/uL (ref 4.0–10.5)

## 2017-02-01 LAB — CBG MONITORING, ED: GLUCOSE-CAPILLARY: 162 mg/dL — AB (ref 65–99)

## 2017-02-01 MED ORDER — PANTOPRAZOLE SODIUM 40 MG IV SOLR
40.0000 mg | Freq: Every day | INTRAVENOUS | Status: DC
  Administered 2017-02-01: 40 mg via INTRAVENOUS
  Filled 2017-02-01: qty 40

## 2017-02-01 MED ORDER — IOPAMIDOL (ISOVUE-370) INJECTION 76%
INTRAVENOUS | Status: AC
  Filled 2017-02-01: qty 50

## 2017-02-01 MED ORDER — STROKE: EARLY STAGES OF RECOVERY BOOK
Freq: Once | Status: DC
  Filled 2017-02-01: qty 1

## 2017-02-01 MED ORDER — SODIUM CHLORIDE 0.9 % IV SOLN
INTRAVENOUS | Status: DC
  Administered 2017-02-01 – 2017-02-02 (×2): via INTRAVENOUS

## 2017-02-01 MED ORDER — ALTEPLASE (STROKE) FULL DOSE INFUSION
0.9000 mg/kg | Freq: Once | INTRAVENOUS | Status: AC
  Administered 2017-02-01: 66 mg via INTRAVENOUS
  Filled 2017-02-01: qty 100

## 2017-02-01 NOTE — ED Provider Notes (Signed)
Emergency Department Provider Note   I have reviewed the triage vital signs and the nursing notes.   HISTORY  Chief Complaint Code Stroke   HPI Destiny [name] is a 83 y.o. female with PMH of CAD, CKD, DM, HLD, HTN presents to the emergency department for evaluation of aphasia. Family reports last normal at 7:15 PM but per nursing aid with the patient she had speech change somewhere in the 3-3:30 PM timeframe but seemed to return to baseline. EMS was called when the patient had persistent speech changes. No numbness/weakness. No reported vision changes. Denies CP or SOB. No fever/chills. Has a history of prior TIAs.   Past Medical History:  Diagnosis Date  . Acute renal insufficiency 12/24/2013  . Amaurosis fugax   . Arthritis 12/06/2012  . Atherosclerosis   . CAD (coronary artery disease)    a. s/p CABG x 4 (LIMA->LAD, VG->Diag, VG->OM1->OM3);  b. 2010 Cath: 3/4 patent grafts (VG->diag occluded), 3vd, sev PVD ->Med Rx;  c. 12/2011 Lexi MV: sm area of mild mid and apical ant wall ischemia ->med rx.  . Carotid bruit   . Chronic kidney disease (CKD) stage G4/A1, severely decreased glomerular filtration rate (GFR) between 15-29 mL/min/1.73 square meter and albuminuria creatinine ratio less than 30 mg/g (HCC)    GFR 25 on 12/25/13  . CVA (cerebral infarction)   . Dementia    pt denies  . Depression 12/24/2013  . Diabetes mellitus type II    insulin dependent.   . Diabetic peripheral neuropathy ([place])   . Diabetic retinopathy   . Diverticulosis   . Duodenal ulcer 2009 and 11/2011   caused GI bleeding  . Gallstones 2009   seen on CT scan  . GI bleed 04/2011, 11/2011   found non-bleeding duodenal ulcers  . Hyperlipidemia   . Hypersomnolence 03/12/2013  . Hypertension   . Iron deficiency anemia secondary to blood loss (chronic)   . Melena 1015/2015   PEPTIC ULCER    REQUIRING TRANSFUSION  . Pedal edema 10/12/2016  . PVD (peripheral vascular disease) ([place])   . Rib fracture  04/21/2015  . Thiamine deficiency 08/13/2015  . UTI (urinary tract infection) 04/08/2014  . Weight loss, non-intentional 01/29/2014    Patient Active Problem List   Diagnosis Date Noted  . Stroke (cerebrum) ([place]) 02/01/2017  . Sinusitis 01/31/2017  . Pedal edema 10/12/2016  . Thiamine deficiency 08/13/2015  . Rib fracture 04/21/2015  . UTI (urinary tract infection) 04/08/2014  . Duodenal ulcer with hemorrhage 12/29/2013  . CKD (chronic kidney disease) stage 4, GFR 15-29 ml/min (HCC) 12/28/2013  . GIB (gastrointestinal bleeding) 12/28/2013  . History of CVA (cerebrovascular accident) 12/28/2013  . Chronic diastolic heart failure ([place]) 12/28/2013  . Overweight (BMI 25.0-29.9) 12/28/2013  . Depression 12/24/2013  . Hypersomnolence 03/12/2013  . Arthritis 12/06/2012  . Unstable angina ([place]) 06/07/2012  . CAD (coronary artery disease) of artery bypass graft   . Hx of CABG 06/05/2012  . Anemia 05/27/2011  . Diabetes mellitus type 2, insulin dependent ([place]) 12/22/2010  . Dementia 12/22/2010  . B12 deficiency 12/22/2010  . Type 2 diabetes mellitus with diabetic neuropathy, unspecified ([place]) 08/28/2008  . MURMUR 07/04/2008  . AMAUROSIS FUGAX 08/12/2007  . Hyperlipidemia 06/22/2007  . Essential hypertension 06/22/2007  . Peripheral vascular disease ([place]) 06/21/2007  . CAROTID BRUIT 06/21/2007    Past Surgical History:  Procedure Laterality Date  . ABDOMINAL HYSTERECTOMY    . COLONOSCOPY N/A 05/20/2011   Performed by Irene Shipper,  MD at [facility] ENDOSCOPY  . CORONARY ARTERY BYPASS GRAFT  1999   x 4  . ENTEROSCOPY N/A 12/29/2013   Performed by Inda Castle, MD at [facility]  . ESOPHAGOGASTRODUODENOSCOPY (EGD) N/A 12/08/2011   Performed by Ladene Artist, MD,FACG at [facility] ENDOSCOPY  . ESOPHAGOGASTRODUODENOSCOPY (EGD) N/A 05/16/2011   Performed by Gatha Mayer, MD at [place]  . UPPER GASTROINTESTINAL ENDOSCOPY  02/22/2008   w/biopsy, duedenal ulcer, antrum erosions       Allergies Patient has no known allergies.  Family History  Problem Relation Age of Onset  . Coronary artery disease Father   . Hypertension Father   . Stroke Father   . Cancer Mother        ?  . Diabetes Maternal Uncle   . Colon cancer Neg Hx     Social History Social History   Tobacco Use  . Smoking status: Former Smoker    Types: Cigarettes    Last attempt to quit: 12/31/2000    Years since quitting: 16.1  . Smokeless tobacco: Never Used  Substance Use Topics  . Alcohol use: No  . Drug use: No    Review of Systems  Constitutional: No fever/chills Eyes: No visual changes. ENT: No sore throat. Cardiovascular: Denies chest pain. Respiratory: Denies shortness of breath. Gastrointestinal: No abdominal pain.  No nausea, no vomiting.  No diarrhea.  No constipation. Genitourinary: Negative for dysuria. Musculoskeletal: Negative for back pain. Skin: Negative for rash. Neurological: Negative for headaches, focal weakness or numbness. Positive speech changes.    10-point ROS otherwise negative.  ____________________________________________   PHYSICAL EXAM:  VITAL SIGNS: Vitals:   02/02/17 0745 02/02/17 0800  BP: (!) 132/56 (!) 153/54  Pulse: (!) 51 (!) 50  Resp: 16 (!) 22  Temp:    SpO2: 98% 96%   Constitutional: Alert and oriented. Well appearing and in no acute distress. Eyes: Conjunctivae are normal.  Head: Atraumatic. Nose: No congestion/rhinnorhea. Mouth/Throat: Mucous membranes are moist.  Neck: No stridor. Cardiovascular: Normal rate, regular rhythm. Good peripheral circulation. Grossly normal heart sounds.   Respiratory: Normal respiratory effort.  No retractions. Lungs CTAB. Gastrointestinal: Soft and nontender. No distention.  Musculoskeletal: No lower extremity tenderness nor edema. No gross deformities of extremities. Neurologic: Normal CN exam 2-12. No pronator drift. No appreciable numbness/weakness in the upper and lower extremities.  Difficulty with word finding and identifying objects.  Skin:  Skin is warm, dry and intact. No rash noted.   ____________________________________________   LABS (all labs ordered are listed, but only abnormal results are displayed)  Labs Reviewed  CBC - Abnormal; Notable for the following components:      Result Value   RBC 3.86 (*)    Hemoglobin 11.9 (*)    HCT 35.5 (*)    All other components within normal limits  COMPREHENSIVE METABOLIC PANEL - Abnormal; Notable for the following components:   Glucose, Bld 183 (*)    BUN 51 (*)    Creatinine, Ser 2.37 (*)    ALT 13 (*)    GFR calc non Af Amer 18 (*)    GFR calc Af Amer 21 (*)    All other components within normal limits  HEMOGLOBIN A1C - Abnormal; Notable for the following components:   Hgb A1c MFr Bld 8.4 (*)    All other components within normal limits  LIPID PANEL - Abnormal; Notable for the following components:   Triglycerides 272 (*)    HDL 36 (*)  VLDL 54 (*)    All other components within normal limits  CBG MONITORING, ED - Abnormal; Notable for the following components:   Glucose-Capillary 162 (*)    All other components within normal limits  I-STAT CHEM 8, ED - Abnormal; Notable for the following components:   BUN 47 (*)    Creatinine, Ser 2.30 (*)    Glucose, Bld 179 (*)    Calcium, Ion 1.14 (*)    Hemoglobin 11.6 (*)    HCT 34.0 (*)    All other components within normal limits  PROTIME-INR  APTT  DIFFERENTIAL  I-STAT TROPONIN, ED   ____________________________________________  EKG   EKG Interpretation  Date/Time:  Monday February 01 2017 21:56:33 EST Ventricular Rate:  52 PR Interval:    QRS Duration: 138 QT Interval:  483 QTC Calculation: 450 R Axis:     Text Interpretation:  Undetermined rhythm.  Similar to prior.  No STEMI.  Confirmed by Nanda Quinton [phone]) on 02/01/2017 10:02:09 PM Also confirmed by Nanda Quinton [phone]), editor Philomena Doheny [phone])  on 02/02/2017 7:27:56 AM        ____________________________________________  RADIOLOGY  Mr Jodene Nam Neck Wo Contrast  Result Date: 02/02/2017 CLINICAL DATA:  Initial evaluation for acute onset aphasia. EXAM: MRI HEAD WITHOUT CONTRAST MRA HEAD WITHOUT CONTRAST MRA NECK WITHOUT CONTRAST TECHNIQUE: Multiplanar, multiecho pulse sequences of the brain and surrounding structures were obtained without intravenous contrast. Angiographic images of the Circle of Willis were obtained using MRA technique without intravenous contrast. Angiographic images of the neck were obtained using MRA technique without intravenous contrast. Carotid stenosis measurements (when applicable) are obtained utilizing NASCET criteria, using the distal internal carotid diameter as the denominator. COMPARISON:  Prior CT from 02/01/2017. FINDINGS: MRI HEAD FINDINGS Brain: Diffuse prominence of the CSF containing spaces compatible with generalized cerebral atrophy. Patchy and confluent T2/FLAIR hyperintensity within the periventricular and deep white matter most consistent with chronic small vessel ischemic disease. Overall, changes are moderately advanced in nature. Multiple superimposed remote lacunar infarcts present within the bilateral basal ganglia, left greater than right. Right occipital lobe encephalomalacia compatible with remote right PCA territory infarct. No abnormal foci of restricted diffusion to suggest acute or subacute ischemia. Gray-white matter differentiation maintained. No other evidence for chronic infarction. No acute or chronic intracranial hemorrhage. No mass lesion, midline shift or mass effect. Mild ex vacuo dilatation of the left lateral ventricle related to the chronic left basal ganglia infarcts. No hydrocephalus. No extra-axial fluid collection. Major dural sinuses are grossly patent. Incidental note made of a partially empty sella. Midline structures intact and normal. Vascular: Abnormal flow void within the right vertebral artery. Major  intracranial vascular flow voids otherwise maintained. Skull and upper cervical spine: Craniocervical junction normal. Upper cervical spine within normal limits. Bone marrow signal intensity normal. Well-circumscribed 16 mm T2 hyperintense lesion noted within the right temporal bone, of doubtful significance. No scalp soft tissue abnormality. Sinuses/Orbits: Globes and orbital soft tissues within normal limits. Patient status post cataract extraction bilaterally. Paranasal sinuses largely clear. Trace left mastoid effusion, of doubtful significance. Inner ear structures normal. Other: None. MRA HEAD FINDINGS ANTERIOR CIRCULATION: Distal cervical internal carotid artery is patent bilaterally. Widely patent petrous segments. Multifocal atheromatous irregularity throughout the cavernous/ supraclinoid ICAs, worse on the left. Severe stenosis at the anterior genu of the cavernous left ICA and, similar though slightly worsened from previous (series 4, image 115). Mild to moderate multifocal narrowing on the right. ICA termini patent. A1 segments patent. Patent anterior  communicating artery. Fairly extensive atheromatous disease throughout the right ACA with moderate to severe multifocal stenoses. Left ACA patent to its distal aspect without flow-limiting stenosis. Atheromatous irregularity within the M1 segments bilaterally which are diffusely narrowed. Short-segment moderate stenosis at the mid left M1 segment, with relatively mild narrowing at the distal right M1. Moderate to severe stenosis of the anterior branch of the right MCA, similar to previous. Small vessel atheromatous irregularity within the MCA branches bilaterally. POSTERIOR CIRCULATION: Dominant left vertebral artery demonstrates diffuse atheromatous disease with multifocal areas of mild to moderate stenoses, progressed relative to previous. Diminutive right vertebral artery terminates in PICA. Posterior inferior cerebral arteries patent bilaterally. Focal  outpouching extending posteriorly into the right from the vertebrobasilar junction likely reflects a small vascular infundibulum, stable from previous. Basilar artery irregular but patent to its distal aspect. Dominant left AICA. Right AICA not visualized. Ectatic basilar tip, stable from previous. Superior cerebral arteries patent bilaterally. Both of the posterior cerebral arteries primarily supplied via the basilar. Severe right P1 stenosis, similar to previous. Right PCA diffusely diseased distally and attenuated as compared to the left, also similar. Severe distal left P2 stenosis also similar. No intracranial aneurysm. MRA NECK FINDINGS Examination limited by lack of IV contrast. Partially visualized aortic arch of normal caliber. Origin of the great vessels not well evaluated on this exam. Partially visualized subclavian arteries patent without obvious stenosis. Visualized common carotid arteries medialized into the retropharyngeal space without flow-limiting stenosis. Atheromatous irregularity about the carotid bifurcations bilaterally, left greater than right. Relatively mild narrowing present on the right, with suspected more moderate to advanced stenosis on the left. Evaluation limited by resolution and lack of contrast. ICAs are patent distally to the skullbase, although the left ICA is attenuated as compared to the right, suggesting a hemodynamically significant stenosis. Both vertebral arteries arise from the subclavian arteries. Dominant left vertebral artery. Diffusely hypoplastic right vertebral artery per vertebral arteries patent within the neck without occlusion or significant stenosis. IMPRESSION: MRI HEAD IMPRESSION: 1. No acute intracranial abnormality.  No acute infarct. 2. Atrophy with multiple chronic lacunar infarcts involving the bilateral basal ganglia, with additional remote right PCA territory infarct. Underlying chronic microvascular ischemic disease. MRA HEAD IMPRESSION: 1. Negative  MRA for large vessel occlusion. 2. Moderate to severe intracranial atherosclerotic disease as above, most severe within the posterior circulation. Overall, changes have mildly progressed relative to 2012. MRA NECK IMPRESSION: 1. Atherosclerotic change about the carotid bifurcations, with probable mild narrowing on the right and more moderate to advanced narrowing on the left. Attenuation of the left ICA beyond the bifurcation suggest a hemodynamically significant stenosis. Exact quantification difficult given low resolution and lack of IV contrast. Correlation with carotid Doppler ultrasound and/or CTA would likely be helpful for further evaluation. 2. Patent vertebral arteries within the neck. Left vertebral artery dominant. Right vertebral artery hypoplastic and terminates in PICA. Electronically Signed   By: Jeannine Boga M.D.   On: 02/02/2017 06:31   Mr Brain Wo Contrast  Result Date: 02/02/2017 CLINICAL DATA:  Initial evaluation for acute onset aphasia. EXAM: MRI HEAD WITHOUT CONTRAST MRA HEAD WITHOUT CONTRAST MRA NECK WITHOUT CONTRAST TECHNIQUE: Multiplanar, multiecho pulse sequences of the brain and surrounding structures were obtained without intravenous contrast. Angiographic images of the Circle of Willis were obtained using MRA technique without intravenous contrast. Angiographic images of the neck were obtained using MRA technique without intravenous contrast. Carotid stenosis measurements (when applicable) are obtained utilizing NASCET criteria, using the distal internal carotid diameter  as the denominator. COMPARISON:  Prior CT from 02/01/2017. FINDINGS: MRI HEAD FINDINGS Brain: Diffuse prominence of the CSF containing spaces compatible with generalized cerebral atrophy. Patchy and confluent T2/FLAIR hyperintensity within the periventricular and deep white matter most consistent with chronic small vessel ischemic disease. Overall, changes are moderately advanced in nature. Multiple  superimposed remote lacunar infarcts present within the bilateral basal ganglia, left greater than right. Right occipital lobe encephalomalacia compatible with remote right PCA territory infarct. No abnormal foci of restricted diffusion to suggest acute or subacute ischemia. Gray-white matter differentiation maintained. No other evidence for chronic infarction. No acute or chronic intracranial hemorrhage. No mass lesion, midline shift or mass effect. Mild ex vacuo dilatation of the left lateral ventricle related to the chronic left basal ganglia infarcts. No hydrocephalus. No extra-axial fluid collection. Major dural sinuses are grossly patent. Incidental note made of a partially empty sella. Midline structures intact and normal. Vascular: Abnormal flow void within the right vertebral artery. Major intracranial vascular flow voids otherwise maintained. Skull and upper cervical spine: Craniocervical junction normal. Upper cervical spine within normal limits. Bone marrow signal intensity normal. Well-circumscribed 16 mm T2 hyperintense lesion noted within the right temporal bone, of doubtful significance. No scalp soft tissue abnormality. Sinuses/Orbits: Globes and orbital soft tissues within normal limits. Patient status post cataract extraction bilaterally. Paranasal sinuses largely clear. Trace left mastoid effusion, of doubtful significance. Inner ear structures normal. Other: None. MRA HEAD FINDINGS ANTERIOR CIRCULATION: Distal cervical internal carotid artery is patent bilaterally. Widely patent petrous segments. Multifocal atheromatous irregularity throughout the cavernous/ supraclinoid ICAs, worse on the left. Severe stenosis at the anterior genu of the cavernous left ICA and, similar though slightly worsened from previous (series 4, image 115). Mild to moderate multifocal narrowing on the right. ICA termini patent. A1 segments patent. Patent anterior communicating artery. Fairly extensive atheromatous disease  throughout the right ACA with moderate to severe multifocal stenoses. Left ACA patent to its distal aspect without flow-limiting stenosis. Atheromatous irregularity within the M1 segments bilaterally which are diffusely narrowed. Short-segment moderate stenosis at the mid left M1 segment, with relatively mild narrowing at the distal right M1. Moderate to severe stenosis of the anterior branch of the right MCA, similar to previous. Small vessel atheromatous irregularity within the MCA branches bilaterally. POSTERIOR CIRCULATION: Dominant left vertebral artery demonstrates diffuse atheromatous disease with multifocal areas of mild to moderate stenoses, progressed relative to previous. Diminutive right vertebral artery terminates in PICA. Posterior inferior cerebral arteries patent bilaterally. Focal outpouching extending posteriorly into the right from the vertebrobasilar junction likely reflects a small vascular infundibulum, stable from previous. Basilar artery irregular but patent to its distal aspect. Dominant left AICA. Right AICA not visualized. Ectatic basilar tip, stable from previous. Superior cerebral arteries patent bilaterally. Both of the posterior cerebral arteries primarily supplied via the basilar. Severe right P1 stenosis, similar to previous. Right PCA diffusely diseased distally and attenuated as compared to the left, also similar. Severe distal left P2 stenosis also similar. No intracranial aneurysm. MRA NECK FINDINGS Examination limited by lack of IV contrast. Partially visualized aortic arch of normal caliber. Origin of the great vessels not well evaluated on this exam. Partially visualized subclavian arteries patent without obvious stenosis. Visualized common carotid arteries medialized into the retropharyngeal space without flow-limiting stenosis. Atheromatous irregularity about the carotid bifurcations bilaterally, left greater than right. Relatively mild narrowing present on the right, with  suspected more moderate to advanced stenosis on the left. Evaluation limited by resolution and lack of  contrast. ICAs are patent distally to the skullbase, although the left ICA is attenuated as compared to the right, suggesting a hemodynamically significant stenosis. Both vertebral arteries arise from the subclavian arteries. Dominant left vertebral artery. Diffusely hypoplastic right vertebral artery per vertebral arteries patent within the neck without occlusion or significant stenosis. IMPRESSION: MRI HEAD IMPRESSION: 1. No acute intracranial abnormality.  No acute infarct. 2. Atrophy with multiple chronic lacunar infarcts involving the bilateral basal ganglia, with additional remote right PCA territory infarct. Underlying chronic microvascular ischemic disease. MRA HEAD IMPRESSION: 1. Negative MRA for large vessel occlusion. 2. Moderate to severe intracranial atherosclerotic disease as above, most severe within the posterior circulation. Overall, changes have mildly progressed relative to 2012. MRA NECK IMPRESSION: 1. Atherosclerotic change about the carotid bifurcations, with probable mild narrowing on the right and more moderate to advanced narrowing on the left. Attenuation of the left ICA beyond the bifurcation suggest a hemodynamically significant stenosis. Exact quantification difficult given low resolution and lack of IV contrast. Correlation with carotid Doppler ultrasound and/or CTA would likely be helpful for further evaluation. 2. Patent vertebral arteries within the neck. Left vertebral artery dominant. Right vertebral artery hypoplastic and terminates in PICA. Electronically Signed   By: Jeannine Boga M.D.   On: 02/02/2017 06:31   Dg Chest Port 1 View  Result Date: 02/01/2017 CLINICAL DATA:  Code stroke.  History of TIA. EXAM: PORTABLE CHEST 1 VIEW COMPARISON:  01/31/2016 FINDINGS: Postoperative changes in the mediastinum. Mild cardiac enlargement. No vascular congestion or edema. No  focal consolidation. No blunting of costophrenic angles. No pneumothorax. Calcified and tortuous aorta. Old bilateral rib fractures. IMPRESSION: Mild cardiac enlargement. No evidence of active pulmonary disease. Aortic atherosclerosis. Electronically Signed   By: Lucienne Capers M.D.   On: 02/01/2017 22:36   Mr Virgel Paling QT Contrast  Result Date: 02/02/2017 CLINICAL DATA:  Initial evaluation for acute onset aphasia. EXAM: MRI HEAD WITHOUT CONTRAST MRA HEAD WITHOUT CONTRAST MRA NECK WITHOUT CONTRAST TECHNIQUE: Multiplanar, multiecho pulse sequences of the brain and surrounding structures were obtained without intravenous contrast. Angiographic images of the Circle of Willis were obtained using MRA technique without intravenous contrast. Angiographic images of the neck were obtained using MRA technique without intravenous contrast. Carotid stenosis measurements (when applicable) are obtained utilizing NASCET criteria, using the distal internal carotid diameter as the denominator. COMPARISON:  Prior CT from 02/01/2017. FINDINGS: MRI HEAD FINDINGS Brain: Diffuse prominence of the CSF containing spaces compatible with generalized cerebral atrophy. Patchy and confluent T2/FLAIR hyperintensity within the periventricular and deep white matter most consistent with chronic small vessel ischemic disease. Overall, changes are moderately advanced in nature. Multiple superimposed remote lacunar infarcts present within the bilateral basal ganglia, left greater than right. Right occipital lobe encephalomalacia compatible with remote right PCA territory infarct. No abnormal foci of restricted diffusion to suggest acute or subacute ischemia. Gray-white matter differentiation maintained. No other evidence for chronic infarction. No acute or chronic intracranial hemorrhage. No mass lesion, midline shift or mass effect. Mild ex vacuo dilatation of the left lateral ventricle related to the chronic left basal ganglia infarcts. No  hydrocephalus. No extra-axial fluid collection. Major dural sinuses are grossly patent. Incidental note made of a partially empty sella. Midline structures intact and normal. Vascular: Abnormal flow void within the right vertebral artery. Major intracranial vascular flow voids otherwise maintained. Skull and upper cervical spine: Craniocervical junction normal. Upper cervical spine within normal limits. Bone marrow signal intensity normal. Well-circumscribed 16 mm T2 hyperintense lesion noted  within the right temporal bone, of doubtful significance. No scalp soft tissue abnormality. Sinuses/Orbits: Globes and orbital soft tissues within normal limits. Patient status post cataract extraction bilaterally. Paranasal sinuses largely clear. Trace left mastoid effusion, of doubtful significance. Inner ear structures normal. Other: None. MRA HEAD FINDINGS ANTERIOR CIRCULATION: Distal cervical internal carotid artery is patent bilaterally. Widely patent petrous segments. Multifocal atheromatous irregularity throughout the cavernous/ supraclinoid ICAs, worse on the left. Severe stenosis at the anterior genu of the cavernous left ICA and, similar though slightly worsened from previous (series 4, image 115). Mild to moderate multifocal narrowing on the right. ICA termini patent. A1 segments patent. Patent anterior communicating artery. Fairly extensive atheromatous disease throughout the right ACA with moderate to severe multifocal stenoses. Left ACA patent to its distal aspect without flow-limiting stenosis. Atheromatous irregularity within the M1 segments bilaterally which are diffusely narrowed. Short-segment moderate stenosis at the mid left M1 segment, with relatively mild narrowing at the distal right M1. Moderate to severe stenosis of the anterior branch of the right MCA, similar to previous. Small vessel atheromatous irregularity within the MCA branches bilaterally. POSTERIOR CIRCULATION: Dominant left vertebral artery  demonstrates diffuse atheromatous disease with multifocal areas of mild to moderate stenoses, progressed relative to previous. Diminutive right vertebral artery terminates in PICA. Posterior inferior cerebral arteries patent bilaterally. Focal outpouching extending posteriorly into the right from the vertebrobasilar junction likely reflects a small vascular infundibulum, stable from previous. Basilar artery irregular but patent to its distal aspect. Dominant left AICA. Right AICA not visualized. Ectatic basilar tip, stable from previous. Superior cerebral arteries patent bilaterally. Both of the posterior cerebral arteries primarily supplied via the basilar. Severe right P1 stenosis, similar to previous. Right PCA diffusely diseased distally and attenuated as compared to the left, also similar. Severe distal left P2 stenosis also similar. No intracranial aneurysm. MRA NECK FINDINGS Examination limited by lack of IV contrast. Partially visualized aortic arch of normal caliber. Origin of the great vessels not well evaluated on this exam. Partially visualized subclavian arteries patent without obvious stenosis. Visualized common carotid arteries medialized into the retropharyngeal space without flow-limiting stenosis. Atheromatous irregularity about the carotid bifurcations bilaterally, left greater than right. Relatively mild narrowing present on the right, with suspected more moderate to advanced stenosis on the left. Evaluation limited by resolution and lack of contrast. ICAs are patent distally to the skullbase, although the left ICA is attenuated as compared to the right, suggesting a hemodynamically significant stenosis. Both vertebral arteries arise from the subclavian arteries. Dominant left vertebral artery. Diffusely hypoplastic right vertebral artery per vertebral arteries patent within the neck without occlusion or significant stenosis. IMPRESSION: MRI HEAD IMPRESSION: 1. No acute intracranial abnormality.   No acute infarct. 2. Atrophy with multiple chronic lacunar infarcts involving the bilateral basal ganglia, with additional remote right PCA territory infarct. Underlying chronic microvascular ischemic disease. MRA HEAD IMPRESSION: 1. Negative MRA for large vessel occlusion. 2. Moderate to severe intracranial atherosclerotic disease as above, most severe within the posterior circulation. Overall, changes have mildly progressed relative to 2012. MRA NECK IMPRESSION: 1. Atherosclerotic change about the carotid bifurcations, with probable mild narrowing on the right and more moderate to advanced narrowing on the left. Attenuation of the left ICA beyond the bifurcation suggest a hemodynamically significant stenosis. Exact quantification difficult given low resolution and lack of IV contrast. Correlation with carotid Doppler ultrasound and/or CTA would likely be helpful for further evaluation. 2. Patent vertebral arteries within the neck. Left vertebral artery dominant. Right vertebral  artery hypoplastic and terminates in PICA. Electronically Signed   By: Jeannine Boga M.D.   On: 02/02/2017 06:31   Ct Head Code Stroke Wo Contrast  Result Date: 02/01/2017 CLINICAL DATA:  Code stroke.  83 y/o  F; aphasia. EXAM: CT HEAD WITHOUT CONTRAST TECHNIQUE: Contiguous axial images were obtained from the base of the skull through the vertex without intravenous contrast. COMPARISON:  11/14/2012 MRI head.  05/14/2011 CT head. FINDINGS: Brain: No evidence of acute infarction, hemorrhage, hydrocephalus, extra-axial collection or mass lesion/mass effect. Stable small chronic infarctions within the right paramedian parietal and occipital lobes. Stable chronic lacunar infarct involving the 15 minutes and extending into the mid corona radiata and caudate body. Stable chronic lacunar infarcts of right putamen. Stable chronic microvascular ischemic changes and parenchymal volume loss of the brain. Vascular: Calcific atherosclerosis  of carotid siphons. No hyperdense vessel identified. Skull: Normal. Negative for fracture or focal lesion. Sinuses/Orbits: No acute finding. Other: None. ASPECTS [facility] Stroke Program Early CT Score) - Ganglionic level infarction (caudate, lentiform nuclei, internal capsule, insula, M1-M3 cortex): 7 - Supraganglionic infarction (M4-M6 cortex): 3 Total score (0-10 with 10 being normal): 10 IMPRESSION: 1. No acute intracranial abnormality identified. 2. Stable chronic lacunar infarcts of basal ganglia, small cortical infarctions in right parietal and occipital lobes, parenchymal volume loss of the brain, and chronic microvascular ischemic changes of white matter. 3. ASPECTS is 10 These results were text paged at the time of interpretation on 02/01/2017 at 9:33 pm to Dr. Lorraine Lax. Electronically Signed   By: Kristine Garbe M.D.   On: 02/01/2017 21:34    ____________________________________________   PROCEDURES  Procedure(s) performed:   Procedures  CRITICAL CARE Performed by: Margette Fast Total critical care time: 35 minutes Critical care time was exclusive of separately billable procedures and treating other patients. Critical care was necessary to treat or prevent imminent or life-threatening deterioration. Critical care was time spent personally by me on the following activities: development of treatment plan with patient and/or surrogate as well as nursing, discussions with consultants, evaluation of patient's response to treatment, examination of patient, obtaining history from patient or surrogate, ordering and performing treatments and interventions, ordering and review of laboratory studies, ordering and review of radiographic studies, pulse oximetry and re-evaluation of patient's condition.  Nanda Quinton, MD Emergency Medicine  ____________________________________________   INITIAL IMPRESSION / ASSESSMENT AND PLAN / ED COURSE  Pertinent labs & imaging results that were  available during my care of the patient were reviewed by me and considered in my medical decision making (see chart for details).  Patient presents to the ED as a code stroke with primarily aphasia and other speech related symptoms. Initially evaluated the patient's airway on arrival and cleared for CT. No immediate contraindications for tPA. BP controlled with Labetalol. Neurology evaluated the patient on arrival and discussed the risk/benfits of tPA in this clinical setting. tPA was started in the ED and the patient was admitted to the ICU.   Discussed patient's case with Neurology to request admission. Patient and family (if present) updated with plan. Care transferred to Neurology service.  I reviewed all nursing notes, vitals, pertinent old records, EKGs, labs, imaging (as available).  ____________________________________________  FINAL CLINICAL IMPRESSION(S) / ED DIAGNOSES  Final diagnoses:  Stroke (cerebrum) ([place])     MEDICATIONS GIVEN DURING THIS VISIT:  Medications  iopamidol (ISOVUE-370) 76 % injection (not administered)   stroke: mapping our early stages of recovery book (not administered)  0.9 %  sodium chloride  infusion ( Intravenous New Bag/Given 02/01/17 2309)  pantoprazole (PROTONIX) injection 40 mg (40 mg Intravenous Given 02/01/17 2312)  labetalol (NORMODYNE,TRANDATE) injection 10 mg (not administered)  alteplase (ACTIVASE) 1 mg/mL infusion 66 mg (0 mg/kg  72.9 kg Intravenous Stopped 02/01/17 2245)    Note:  This document was prepared using Dragon voice recognition software and may include unintentional dictation errors.  Nanda Quinton, MD Emergency Medicine    Leslieann Whisman, [facility], MD 02/02/17 0900

## 2017-02-01 NOTE — ED Notes (Signed)
Pt TPA was delayed due to BP being 184/58 hr 57 98% RA pt was given 10mg  of labetalol iv which brought bp down to 140/54 56hr 98% spo2

## 2017-02-01 NOTE — ED Triage Notes (Signed)
Pt coming from home. LKW 1720 pt having aphasia. Son called to check on his mom when he noticed she couldn't really carry a conversation around 46. Ems was called and code stroke was put out. Pt had a 20g put in left forearm area.

## 2017-02-01 NOTE — H&P (Addendum)
Chief Complaint:  Confusion, difficulty speaking   History obtained from:   Patient and Chart   HPI:                                                                                                                                       Destiny [name] is an 83 y.o. female with past medical history of coronary artery disease status post CABG, hypertension, diabetes, GI bleeding, TIAs, CKD, mild dementia  who presents with sudden onset aphasia noticed her on 6:30 - 7 pm.  The patient was apparently normal this morning however around 3 PM home health noticed that she had a dazed look and was confused. This lasted for a few minutes and the patient returned to her baseline. Her granddaughter checked on her at 5 PM and grandson around 5:20 PM and she was perfectly fine and had was having a normal conversation. However son called between 6:30 and 7 PM, she seemed very confused and not making sense. He called 911 and when paramedics arrived it was noted the patient had difficulty with language. Blood pressure was 203 systolic on arrival.   Patient was brought to to [facility] ER around 9 PM. Examination was consistent for isolated aphasia the patient having difficulty naming, repetition and paraphasic errors. CT head was negative. CTA was not performed due to CKD  and low suspicion of large vessel occlusion. After discussing risks versus benefits with family and decided to administer TPA. TPA was administered after lowering blood pressure with labetalol.  Date last known well: 11.19.18 Time last known well: 5.20 pm tPA Given: yes NIHSS: 3 Baseline MRS 2   Past Medical History:  Diagnosis Date  . Acute renal insufficiency 12/24/2013  . Amaurosis fugax   . Arthritis 12/06/2012  . Atherosclerosis   . CAD (coronary artery disease)    a. s/p CABG x 4 (LIMA->LAD, VG->Diag, VG->OM1->OM3);  b. 2010 Cath: 3/4 patent grafts (VG->diag occluded), 3vd, sev PVD ->Med Rx;  c. 12/2011 Lexi MV: sm area of mild  mid and apical ant wall ischemia ->med rx.  . Carotid bruit   . Chronic kidney disease (CKD) stage G4/A1, severely decreased glomerular filtration rate (GFR) between 15-29 mL/min/1.73 square meter and albuminuria creatinine ratio less than 30 mg/g (HCC)    GFR 25 on 12/25/13  . CVA (cerebral infarction)   . Dementia    pt denies  . Depression 12/24/2013  . Diabetes mellitus type II    insulin dependent.   . Diabetic peripheral neuropathy ([place])   . Diabetic retinopathy   . Diverticulosis   . Duodenal ulcer 2009 and 11/2011   caused GI bleeding  . Gallstones 2009   seen on CT scan  . GI bleed 04/2011, 11/2011   found non-bleeding duodenal ulcers  . Hyperlipidemia   . Hypersomnolence 03/12/2013  . Hypertension   . Iron deficiency anemia secondary  to blood loss (chronic)   . Melena 1015/2015   PEPTIC ULCER    REQUIRING TRANSFUSION  . Pedal edema 10/12/2016  . PVD (peripheral vascular disease) ([place])   . Rib fracture 04/21/2015  . Thiamine deficiency 08/13/2015  . UTI (urinary tract infection) 04/08/2014  . Weight loss, non-intentional 01/29/2014    Past Surgical History:  Procedure Laterality Date  . ABDOMINAL HYSTERECTOMY    . COLONOSCOPY N/A 05/20/2011   Performed by Irene Shipper, MD at [place]  . CORONARY ARTERY BYPASS GRAFT  1999   x 4  . ENTEROSCOPY N/A 12/29/2013   Performed by Inda Castle, MD at [facility]  . ESOPHAGOGASTRODUODENOSCOPY (EGD) N/A 12/08/2011   Performed by Ladene Artist, MD,FACG at [facility] ENDOSCOPY  . ESOPHAGOGASTRODUODENOSCOPY (EGD) N/A 05/16/2011   Performed by Gatha Mayer, MD at [place]  . UPPER GASTROINTESTINAL ENDOSCOPY  02/22/2008   w/biopsy, duedenal ulcer, antrum erosions    Family History  Problem Relation Age of Onset  . Coronary artery disease Father   . Hypertension Father   . Stroke Father   . Cancer Mother        ?  . Diabetes Maternal Uncle   . Colon cancer Neg Hx    Social History:  reports that she quit smoking about  16 years ago. Her smoking use included cigarettes. she has never used smokeless tobacco. She reports that she does not drink alcohol or use drugs.  Allergies: No Known Allergies  Medications:                                                                                                                        I reviewed home medications, On plavix at home    ROS:                                                                                                                                     14 systems reviewed and negative except above    Examination:  General: Appears well-developed and well-nourished.  Psych: Affect appropriate to situation Eyes: No scleral injection HENT: No OP obstrucion Head: Normocephalic.  Cardiovascular: Normal rate and regular rhythm.  Respiratory: Effort normal and breath sounds normal to anterior ascultation GI: Soft.  No distension. There is no tenderness.  Skin: WDI   Neurological Examination Mental Status: Alert, not fully oriented. Could not name month, age, address. Difficulty reading, repetition and paraphasic errors.  Able to follow 1 step commands without difficulty, had difficulty with complex commands.  Cranial Nerves: II: Visual fields grossly normal,  III,IV, VI: ptosis not present, extra-ocular motions intact bilaterally, pupils equal, round, reactive to light and accommodation V,VII: smile symmetric, facial light touch sensation normal bilaterally VIII: hearing normal bilaterally IX,X: uvula rises symmetrically XI: bilateral shoulder shrug XII: midline tongue extension Motor: Right : Upper extremity   5/5    Left:     Upper extremity   5/5  Lower extremity   5/5     Lower extremity   5/5 Tone and bulk:normal tone throughout; no atrophy noted Sensory: Pinprick and light touch intact throughout, bilaterally Deep Tendon Reflexes: 2+ and  symmetric throughout Plantars: Right: downgoing   Left: downgoing Cerebellar: normal finger-to-nose, normal rapid alternating movements and normal heel-to-shin test Gait: normal gait and station     Lab Results: Basic Metabolic Panel: Recent Labs  Lab 01/26/17 1114 02/01/17 06/12/08 02/01/17 06/13/2115  NA 138 138 140  K 4.7 3.7 3.7  CL 103 105 106  CO2 28 24  --   GLUCOSE 230* 183* 179*  BUN 46* 51* 47*  CREATININE 1.86* 2.37* 2.30*  CALCIUM 9.8 9.2  --     CBC: Recent Labs  Lab 01/26/17 1114 02/01/17 Jun 12, 2108 02/01/17 13-Jun-2115  WBC 8.4 9.7  --   NEUTROABS 5.4 5.8  --   HGB 12.9 11.9* 11.6*  HCT 38.2 35.5* 34.0*  MCV 93.4 92.0  --   PLT 209.0 194  --     Coagulation Studies: Recent Labs    02/01/17 06-12-2108  LABPROT 14.1  INR 1.10    Imaging: Dg Chest Port 1 View  Result Date: 02/01/2017 CLINICAL DATA:  Code stroke.  History of TIA. EXAM: PORTABLE CHEST 1 VIEW COMPARISON:  01/31/2016 FINDINGS: Postoperative changes in the mediastinum. Mild cardiac enlargement. No vascular congestion or edema. No focal consolidation. No blunting of costophrenic angles. No pneumothorax. Calcified and tortuous aorta. Old bilateral rib fractures. IMPRESSION: Mild cardiac enlargement. No evidence of active pulmonary disease. Aortic atherosclerosis. Electronically Signed   By: Lucienne Capers M.D.   On: 02/01/2017 22:36   Ct Head Code Stroke Wo Contrast  Result Date: 02/01/2017 CLINICAL DATA:  Code stroke.  83 y/o  F; aphasia. EXAM: CT HEAD WITHOUT CONTRAST TECHNIQUE: Contiguous axial images were obtained from the base of the skull through the vertex without intravenous contrast. COMPARISON:  11/14/2012 MRI head.  05/14/2011 CT head. FINDINGS: Brain: No evidence of acute infarction, hemorrhage, hydrocephalus, extra-axial collection or mass lesion/mass effect. Stable small chronic infarctions within the right paramedian parietal and occipital lobes. Stable chronic lacunar infarct involving the 15  minutes and extending into the mid corona radiata and caudate body. Stable chronic lacunar infarcts of right putamen. Stable chronic microvascular ischemic changes and parenchymal volume loss of the brain. Vascular: Calcific atherosclerosis of carotid siphons. No hyperdense vessel identified. Skull: Normal. Negative for fracture or focal lesion. Sinuses/Orbits: No acute finding. Other: None. ASPECTS [facility] Stroke Program Early CT Score) - Ganglionic level infarction (caudate, lentiform nuclei,  internal capsule, insula, M1-M3 cortex): 7 - Supraganglionic infarction (M4-M6 cortex): 3 Total score (0-10 with 10 being normal): 10 IMPRESSION: 1. No acute intracranial abnormality identified. 2. Stable chronic lacunar infarcts of basal ganglia, small cortical infarctions in right parietal and occipital lobes, parenchymal volume loss of the brain, and chronic microvascular ischemic changes of white matter. 3. ASPECTS is 10 These results were text paged at the time of interpretation on 02/01/2017 at 9:33 pm to Dr. Lorraine Lax. Electronically Signed   By: Kristine Garbe M.D.   On: 02/01/2017 21:34     ASSESSMENT AND PLAN  83 y.o. female with past medical history of coronary artery disease status post CABG, hypertension, diabetes, GI bleeding, TIAs, CKD, mild dementia  who presents with sudden onset aphasia noticed her on 6:30 - 7 pm.  Acute Ischemic Stroke S/p TPA adminstration  Risk factors: multiple including CAD, HTN, DM Etiology: ICAD/atherovs cardioembolic   # MRI of the brain without contrast #MRA Head and neck  #Transthoracic Echo  #hold Antiplatelet until 24 hr repeat CT scan #Start or continue Atorvastatin 80 mg/other high intensity statin # BP goal: permissive HTN upto 711 systolic  # HBAIC and Lipid profile # Telemetry monitoring # Frequent neuro checks # NPO until passes stroke swallow screen  HTN Permissive HTN first 24 hrs labetolol PRN   DM  SS insulin  Hbaic ordered  Diabetic  diet  CAD Hold antiplatelet for now   DVT PPX: SCD Diet: npo until passes swallow eval    Please page stroke NP  Or  PA  Or MD from 8am -4 pm  as this patient from this time will be  followed by the stroke.   You can look them up on [email or web address]  Password [email or web address]  I spent 60 min in the management including diagnosis and treatment of this neurologically critical ill patient.   Lamona Eimer [facility] Pager Number [phone]

## 2017-02-01 NOTE — Code Documentation (Signed)
Code Stroke, LSN 8403 per family, history of TIAs, NIH 3, + aphasia, CT Head -, BP at 2140 was 184/58 (90) HR 57 96% RA 18RR, 10mg  Labetalol IV given at 2140, repeat BP at 2145 was 140/54 HR 54, TPA started at 2145.  Blood Sugar 162

## 2017-02-02 ENCOUNTER — Other Ambulatory Visit: Payer: Self-pay

## 2017-02-02 ENCOUNTER — Inpatient Hospital Stay (HOSPITAL_COMMUNITY): Payer: Medicare Other

## 2017-02-02 ENCOUNTER — Encounter (HOSPITAL_COMMUNITY): Payer: Self-pay | Admitting: *Deleted

## 2017-02-02 DIAGNOSIS — E785 Hyperlipidemia, unspecified: Secondary | ICD-10-CM

## 2017-02-02 DIAGNOSIS — G459 Transient cerebral ischemic attack, unspecified: Principal | ICD-10-CM

## 2017-02-02 DIAGNOSIS — Z794 Long term (current) use of insulin: Secondary | ICD-10-CM

## 2017-02-02 DIAGNOSIS — Z8673 Personal history of transient ischemic attack (TIA), and cerebral infarction without residual deficits: Secondary | ICD-10-CM

## 2017-02-02 DIAGNOSIS — I10 Essential (primary) hypertension: Secondary | ICD-10-CM

## 2017-02-02 DIAGNOSIS — I6522 Occlusion and stenosis of left carotid artery: Secondary | ICD-10-CM

## 2017-02-02 DIAGNOSIS — E1159 Type 2 diabetes mellitus with other circulatory complications: Secondary | ICD-10-CM

## 2017-02-02 DIAGNOSIS — N183 Chronic kidney disease, stage 3 (moderate): Secondary | ICD-10-CM

## 2017-02-02 LAB — URINALYSIS, COMPLETE (UACMP) WITH MICROSCOPIC
Bilirubin Urine: NEGATIVE
Glucose, UA: NEGATIVE mg/dL
Ketones, ur: NEGATIVE mg/dL
Nitrite: NEGATIVE
PROTEIN: 100 mg/dL — AB
SPECIFIC GRAVITY, URINE: 1.013 (ref 1.005–1.030)
pH: 5 (ref 5.0–8.0)

## 2017-02-02 LAB — LIPID PANEL
CHOL/HDL RATIO: 4.6 ratio
Cholesterol: 167 mg/dL (ref 0–200)
HDL: 36 mg/dL — AB (ref >40)
LDL CALC: 77 mg/dL (ref 0–99)
Triglycerides: 272 mg/dL — ABNORMAL HIGH (ref <150)
VLDL: 54 mg/dL — AB (ref 0–40)

## 2017-02-02 LAB — MRSA PCR SCREENING: MRSA BY PCR: NEGATIVE

## 2017-02-02 LAB — HEMOGLOBIN A1C
Hgb A1c MFr Bld: 8.4 % — ABNORMAL HIGH (ref 4.8–5.6)
MEAN PLASMA GLUCOSE: 194.38 mg/dL

## 2017-02-02 LAB — GLUCOSE, CAPILLARY
GLUCOSE-CAPILLARY: 208 mg/dL — AB (ref 65–99)
Glucose-Capillary: 171 mg/dL — ABNORMAL HIGH (ref 65–99)
Glucose-Capillary: 267 mg/dL — ABNORMAL HIGH (ref 65–99)

## 2017-02-02 MED ORDER — FERROUS SULFATE 325 (65 FE) MG PO TABS
325.0000 mg | ORAL_TABLET | Freq: Every day | ORAL | Status: DC
Start: 2017-02-02 — End: 2017-02-03
  Administered 2017-02-02 – 2017-02-03 (×2): 325 mg via ORAL
  Filled 2017-02-02 (×2): qty 1

## 2017-02-02 MED ORDER — AMLODIPINE BESYLATE 5 MG PO TABS
5.0000 mg | ORAL_TABLET | Freq: Every day | ORAL | Status: DC
  Administered 2017-02-02 – 2017-02-03 (×2): 5 mg via ORAL
  Filled 2017-02-02 (×2): qty 1

## 2017-02-02 MED ORDER — LABETALOL HCL 5 MG/ML IV SOLN: 10.0000 mg | INTRAVENOUS | Status: DC | PRN

## 2017-02-02 MED ORDER — INSULIN GLARGINE 100 UNIT/ML ~~LOC~~ SOLN
32.0000 [IU] | Freq: Every day | SUBCUTANEOUS | Status: DC
  Administered 2017-02-02: 32 [IU] via SUBCUTANEOUS
  Filled 2017-02-02 (×2): qty 0.32

## 2017-02-02 MED ORDER — ISOSORBIDE MONONITRATE ER 30 MG PO TB24
60.0000 mg | ORAL_TABLET | Freq: Every day | ORAL | Status: DC
  Administered 2017-02-02 – 2017-02-03 (×2): 60 mg via ORAL
  Filled 2017-02-02 (×2): qty 2

## 2017-02-02 MED ORDER — ASPIRIN EC 81 MG PO TBEC
81.0000 mg | DELAYED_RELEASE_TABLET | Freq: Every day | ORAL | Status: DC
  Administered 2017-02-03: 81 mg via ORAL
  Filled 2017-02-02: qty 1

## 2017-02-02 MED ORDER — CITALOPRAM HYDROBROMIDE 10 MG PO TABS
20.0000 mg | ORAL_TABLET | Freq: Every day | ORAL | Status: DC
  Administered 2017-02-02 – 2017-02-03 (×2): 20 mg via ORAL
  Filled 2017-02-02 (×2): qty 2

## 2017-02-02 MED ORDER — ISOSORBIDE MONONITRATE ER 30 MG PO TB24
30.0000 mg | ORAL_TABLET | Freq: Every day | ORAL | Status: DC
  Administered 2017-02-02: 30 mg via ORAL
  Filled 2017-02-02: qty 1

## 2017-02-02 MED ORDER — ASPIRIN 325 MG PO TABS
325.0000 mg | ORAL_TABLET | Freq: Once | ORAL | Status: AC
  Administered 2017-02-02: 325 mg via ORAL
  Filled 2017-02-02: qty 1

## 2017-02-02 MED ORDER — VITAMIN D 1000 UNITS PO TABS
1000.0000 [IU] | ORAL_TABLET | Freq: Every evening | ORAL | Status: DC
  Administered 2017-02-02 – 2017-02-03 (×2): 1000 [IU] via ORAL
  Filled 2017-02-02 (×2): qty 1

## 2017-02-02 MED ORDER — FAMOTIDINE 20 MG PO TABS
20.0000 mg | ORAL_TABLET | Freq: Every day | ORAL | Status: DC
  Administered 2017-02-02: 20 mg via ORAL
  Filled 2017-02-02: qty 1

## 2017-02-02 MED ORDER — NITROGLYCERIN 0.4 MG SL SUBL: 0.4000 mg | SUBLINGUAL_TABLET | SUBLINGUAL | Status: DC | PRN

## 2017-02-02 MED ORDER — ASPIRIN 325 MG PO TABS: 325.0000 mg | ORAL_TABLET | Freq: Every day | ORAL | Status: DC

## 2017-02-02 MED ORDER — FAMOTIDINE 20 MG PO TABS: 40.0000 mg | ORAL_TABLET | Freq: Every day | ORAL | Status: DC

## 2017-02-02 MED ORDER — INSULIN ASPART 100 UNIT/ML ~~LOC~~ SOLN
0.0000 [IU] | Freq: Three times a day (TID) | SUBCUTANEOUS | Status: DC
Start: 2017-02-02 — End: 2017-02-03
  Administered 2017-02-02: 3 [IU] via SUBCUTANEOUS
  Administered 2017-02-02: 8 [IU] via SUBCUTANEOUS
  Administered 2017-02-03: 6 [IU] via SUBCUTANEOUS
  Administered 2017-02-03: 5 [IU] via SUBCUTANEOUS
  Administered 2017-02-03: 3 [IU] via SUBCUTANEOUS

## 2017-02-02 MED ORDER — INSULIN ASPART 100 UNIT/ML ~~LOC~~ SOLN
0.0000 [IU] | Freq: Every day | SUBCUTANEOUS | Status: DC
  Administered 2017-02-02: 2 [IU] via SUBCUTANEOUS

## 2017-02-02 MED ORDER — ATORVASTATIN CALCIUM 20 MG PO TABS
20.0000 mg | ORAL_TABLET | Freq: Every day | ORAL | Status: DC
  Administered 2017-02-02 – 2017-02-03 (×2): 20 mg via ORAL
  Filled 2017-02-02 (×2): qty 1

## 2017-02-02 NOTE — Progress Notes (Signed)
PT Cancellation Note  Patient Details Name: Destiny [name] MRN: [id number] DOB: 1932-01-04   Cancelled Treatment:    Reason Eval/Treat Not Completed: Patient not medically ready, pt still with strict bedrest orders. Please advance activity orders when pt ready for PT/ OT.    [name] Imberly Troxler 02/02/2017, 8:36 AM

## 2017-02-02 NOTE — ED Notes (Signed)
Pt taken to MRI  

## 2017-02-02 NOTE — Progress Notes (Addendum)
STROKE TEAM PROGRESS NOTE   SUBJECTIVE (INTERVAL HISTORY) Her daughter-in-law is at the bedside.  Overall she feels her condition is completely resolved. She is sitting for breakfast during round. As per daughter-in-law, her speech is back to her baseline. She not orientated to time but that is her baseline. She can not tell me what happened.   As per daughter-in-law, pt was found over the phone by son the she had speech difficulty, EMS called, pt was found to have aphasia, confusion on and off, with HA. In ED, tPA given. At that time, her speech is largely recovered, but still has some confusion and HA which apparently resolved over night.   OBJECTIVE Temp:  [97.6 F (36.4 C)-98.3 F (36.8 C)] 97.6 F (36.4 C) (11/20 1200) Pulse Rate:  [49-56] 52 (11/20 0849) Cardiac Rhythm: Sinus bradycardia (11/20 0725) Resp:  [14-24] 16 (11/20 0849) BP: (131-172)/(50-73) 172/59 (11/20 1100) SpO2:  [95 %-100 %] 99 % (11/20 0849) Weight:  [160 lb 11.5 oz (72.9 kg)] 160 lb 11.5 oz (72.9 kg) (11/19 2126)  Recent Labs  Lab 02/01/17 2113 02/02/17 1228  GLUCAP 162* 171*   Recent Labs  Lab 02/01/17 2110 02/01/17 2117  NA 138 140  K 3.7 3.7  CL 105 106  CO2 24  --   GLUCOSE 183* 179*  BUN 51* 47*  CREATININE 2.37* 2.30*  CALCIUM 9.2  --    Recent Labs  Lab 02/01/17 2110  AST 18  ALT 13*  ALKPHOS 70  BILITOT 0.8  PROT 6.7  ALBUMIN 3.5   Recent Labs  Lab 02/01/17 2110 02/01/17 2117  WBC 9.7  --   NEUTROABS 5.8  --   HGB 11.9* 11.6*  HCT 35.5* 34.0*  MCV 92.0  --   PLT 194  --    No results for input(s): CKTOTAL, CKMB, CKMBINDEX, TROPONINI in the last 168 hours. Recent Labs    02/01/17 2110  LABPROT 14.1  INR 1.10   No results for input(s): COLORURINE, LABSPEC, PHURINE, GLUCOSEU, HGBUR, BILIRUBINUR, KETONESUR, PROTEINUR, UROBILINOGEN, NITRITE, LEUKOCYTESUR in the last 72 hours.  Invalid input(s): APPERANCEUR     Component Value Date/Time   CHOL 167 02/02/2017 0317   TRIG 272 (H) 02/02/2017 0317   HDL 36 (L) 02/02/2017 0317   CHOLHDL 4.6 02/02/2017 0317   VLDL 54 (H) 02/02/2017 0317   LDLCALC 77 02/02/2017 0317   Lab Results  Component Value Date   HGBA1C 8.4 (H) 02/02/2017   No results found for: LABOPIA, COCAINSCRNUR, LABBENZ, AMPHETMU, THCU, LABBARB  No results for input(s): ETH in the last 168 hours.  I have personally reviewed the radiological images below and agree with the radiology interpretations.  Mri and Mra head and Neck Wo Contrast 02/02/2017 IMPRESSION: MRI HEAD IMPRESSION: 1. No acute intracranial abnormality.  No acute infarct. 2. Atrophy with multiple chronic lacunar infarcts involving the bilateral basal ganglia, with additional remote right PCA territory infarct. Underlying chronic microvascular ischemic disease. MRA HEAD IMPRESSION: 1. Negative MRA for large vessel occlusion. 2. Moderate to severe intracranial atherosclerotic disease as above, most severe within the posterior circulation. Overall, changes have mildly progressed relative to 2012. MRA NECK IMPRESSION: 1. Atherosclerotic change about the carotid bifurcations, with probable mild narrowing on the right and more moderate to advanced narrowing on the left. Attenuation of the left ICA beyond the bifurcation suggest a hemodynamically significant stenosis. Exact quantification difficult given low resolution and lack of IV contrast. Correlation with carotid Doppler ultrasound and/or CTA would likely be  helpful for further evaluation. 2. Patent vertebral arteries within the neck. Left vertebral artery dominant. Right vertebral artery hypoplastic and terminates in PICA.   Ct Head Code Stroke Wo Contrast 02/01/2017 IMPRESSION: 1. No acute intracranial abnormality identified. 2. Stable chronic lacunar infarcts of basal ganglia, small cortical infarctions in right parietal and occipital lobes, parenchymal volume loss of the brain, and chronic microvascular ischemic changes of white  matter. 3. ASPECTS is 10   Carotid Doppler  pending  2D Echocardiogram 01/06/17 - Left ventricle: The cavity size was normal. Wall thickness was   increased in a pattern of moderate LVH. Systolic function was   normal. The estimated ejection fraction was in the range of 55%   to 60%. Features are consistent with a pseudonormal left   ventricular filling pattern, with concomitant abnormal relaxation   and increased filling pressure (grade 2 diastolic dysfunction). - Left atrium: The atrium was mildly dilated. - Pulmonary arteries: Systolic pressure was moderately increased.   PA peak pressure: 51 mm Hg (S).  EEG This awake and asleep EEG is abnormal due to the presence of: 1. Moderate diffuse slowing of the waking background 2. Focal slowing over the left hemisphere Clinical Correlation of the above findings indicates diffuse cerebral dysfunction that is non-specific in etiology and can be seen with hypoxic/ischemic injury, toxic/metabolic encephalopathies, neurodegenerative disorders, or medication effect. Focal slowing over the left hemisphere indicates focal cerebral dysfunction in this region suggestive of underlying structural or physiologic abnormality.  The absence of epileptiform discharges does not rule out a clinical diagnosis of epilepsy.  Clinical correlation is advised.   PHYSICAL EXAM  Temp:  [97.6 F (36.4 C)-98.3 F (36.8 C)] 97.6 F (36.4 C) (11/20 1200) Pulse Rate:  [49-56] 52 (11/20 0849) Resp:  [14-24] 16 (11/20 0849) BP: (131-172)/(50-73) 172/59 (11/20 1100) SpO2:  [95 %-100 %] 99 % (11/20 0849) Weight:  [160 lb 11.5 oz (72.9 kg)] 160 lb 11.5 oz (72.9 kg) (11/19 2126)  General - Well nourished, well developed, in no apparent distress.  Ophthalmologic - fundi not visualized due to noncooperation.  Cardiovascular - Regular rate and rhythm with no murmur.  Mental Status -  Level of arousal and orientation to place, and person were intact, however, not  orientated to time or age. Language including expression, repetition, comprehension was assessed and found intact, naming 2/4.  Cranial Nerves II - XII - II - Visual field intact OU. III, IV, VI - Extraocular movements intact. V - Facial sensation intact bilaterally. VII - Facial movement intact bilaterally. VIII - Hearing & vestibular intact bilaterally. X - Palate elevates symmetrically. XI - Chin turning & shoulder shrug intact bilaterally. XII - Tongue protrusion intact.  Motor Strength - The patient's strength was symmetrical in all extremities and pronator drift was absent.  Bulk was normal and fasciculations were absent.   Motor Tone - Muscle tone was assessed at the neck and appendages and was normal.  Reflexes - The patient's reflexes were symmetrical in all extremities and she had no pathological reflexes.  Sensory - Light touch, temperature/pinprick were assessed and were symmetrical.    Coordination - not cooperative on exam.  Tremor was absent.  Gait and Station - deferred.   ASSESSMENT/PLAN Ms. Destiny [name] is a 83 y.o. female with history of dementia off aricept, CAD s/p CABG, CKD, DM, HTN, HLD, PVD, carotid disease, TIAs vs. Syncope admitted for episode of aphasia and confusion. TPA given.    TIA most likely given stroke risk factors  and possible left CIA stenosis. Seizure still in DDx  Resultant back to baseline  MRI  No acute infarct but chronic b/l BG, right PCA infarct   MRA head Posterior circulation stenosis including BA, PCAs and right VA hypoplastic. Left siphon atherosclerosis  MRA neck - left ICA possible high grade stenosis  Carotid Doppler  pending  2D Echo  12/2016 EF 55-60%  EEG left focal slowing  LDL 77  HgbA1c 8.4  SCDs for VTE prophylaxis  Diet Carb Modified Fluid consistency: Thin; Room service appropriate? Yes   clopidogrel 75 mg daily prior to admission, now on No antithrombotic within 24h of tPA  Patient counseled to be  compliant with her antithrombotic medications  Ongoing aggressive stroke risk factor management  Therapy recommendations:  pending  Disposition:  Pending  Left ICA stenosis  Right ICA 50% in 2009  MRA neck right ICA mild stenosis this time  MRA neck concerning for left ICA high grade stenosis  CUS pending  Possible not a good candidate for carotid intervention  ? Seizure  EEG left focal slowing, no seizure  Hx of syncope vs. Seizure 11/2012 - EEG normal at that time  Currently neuro intact at baseline  No AED necessary   Diabetes  HgbA1c 8.4 goal < 7.0  Uncontrolled  Currently on SSI  CBG monitoring  Dietic diet and follow up with PCP  Hypertension Stable on the high side Permissive hypertension (OK if <180/105) for 24-48 hours post stroke and then gradually normalized within 5-7 days.  Long term BP goal depends on the degree of left ICA stenosis   Hyperlipidemia  Home meds:  lipitor 20   LDL 77, goal < 70  Now on lipitor 20  Continue statin at discharge  Other Stroke Risk Factors  Advanced age  Hx stroke/TIAs - by imaging and history  Coronary artery disease s/p CABG  PVD  Other Active Problems  CKD stage III  Dementia off aricept  Hospital day # 1  This patient is critically ill due to stroke like symptoms s/p tPA, left ICA possible high grade stenosis, DM, CKD and at significant risk of neurological worsening, death form recurrent stroke, hemorrhagic conversion, heart failure, seizure. This patient's care requires constant monitoring of vital signs, hemodynamics, respiratory and cardiac monitoring, review of multiple databases, neurological assessment, discussion with family, other specialists and medical decision making of high complexity. I had long discussion with daughter in law and pt at bedside, updated pt current condition, treatment plan and potential prognosis. They expressed understanding and appreciation. I spent 40 minutes of  neurocritical care time in the care of this patient.   Rosalin Hawking, MD PhD Stroke Neurology 02/02/2017 12:33 PM    To contact Stroke Continuity provider, please refer to [email or web address] After hours, contact General Neurology

## 2017-02-02 NOTE — Progress Notes (Signed)
EEG complete - results pending 

## 2017-02-02 NOTE — Progress Notes (Signed)
Patient arrived to 4N16 accompanied with family. Safety precautions and orders reviewed with patient. No distress noted or concerns voice at this time. Family at bedside. Will continue to monitor.  Ave Filter, RN

## 2017-02-02 NOTE — Progress Notes (Signed)
Pt is back from CT, tolerated well.

## 2017-02-02 NOTE — Procedures (Signed)
ELECTROENCEPHALOGRAM REPORT  Date of Study: 02/02/2017  Patient's Name: Destiny [name] MRN: [id number] Date of Birth: [birth year]  Referring Provider: Dr. Rosalin Hawking  Clinical History: This is an 83 year old woman with aphasia, who had an episode of dazed look and confusion.  Medications: 0.9 % sodium chloride infusion  labetalol (NORMODYNE,TRANDATE) injection 10 mg  pantoprazole (PROTONIX) injection 40 mg   Technical Summary: A multichannel digital EEG recording measured by the international 10-20 system with electrodes applied with paste and impedances below 5000 ohms performed as portable with EKG monitoring in an awake and asleep patient.  Hyperventilation and photic stimulation were not performed.  The digital EEG was referentially recorded, reformatted, and digitally filtered in a variety of bipolar and referential montages for optimal display.   Description: The patient is awake and asleep during the recording.  During maximal wakefulness, there is a symmetric, medium voltage 7 Hz posterior dominant rhythm that poorly attenuates with eye opening and eye closure. This is admixed with a moderate amount of diffuse 4-5 Hz theta and 2-3 Hz delta slowing of the waking background. There is additional occasional focal delta slowing over the left hemisphere. During drowsiness and sleep, there is an increase in theta and delta slowing of the background with poorly formed vertex waves and sleep spindles seen.  Hyperventilation and photic stimulation were not peformed.  There were no epileptiform discharges or electrographic seizures seen.    EKG lead showed sinus bradycardia at 48 bpm.  Impression: This awake and asleep EEG is abnormal due to the presence of: 1. Moderate diffuse slowing of the waking background 2. Focal slowing over the left hemisphere  Clinical Correlation of the above findings indicates diffuse cerebral dysfunction that is non-specific in etiology and can be seen with  hypoxic/ischemic injury, toxic/metabolic encephalopathies, neurodegenerative disorders, or medication effect. Focal slowing over the left hemisphere indicates focal cerebral dysfunction in this region suggestive of underlying structural or physiologic abnormality.  The absence of epileptiform discharges does not rule out a clinical diagnosis of epilepsy.  Clinical correlation is advised.   Ellouise Newer, M.D.

## 2017-02-02 NOTE — Evaluation (Signed)
Speech Language Pathology Evaluation Patient Details Name: Destiny [name] MRN: [id number] DOB: [birth year] Today's Date: 02/02/2017 Time: 7026-3785 SLP Time Calculation (min) (ACUTE ONLY): 14 min  Problem List:  Patient Active Problem List   Diagnosis Date Noted  . Stroke (cerebrum) ([place]) 02/01/2017  . Sinusitis 01/31/2017  . Pedal edema 10/12/2016  . Thiamine deficiency 08/13/2015  . Rib fracture 04/21/2015  . UTI (urinary tract infection) 04/08/2014  . Duodenal ulcer with hemorrhage 12/29/2013  . CKD (chronic kidney disease) stage 4, GFR 15-29 ml/min (HCC) 12/28/2013  . GIB (gastrointestinal bleeding) 12/28/2013  . History of CVA (cerebrovascular accident) 12/28/2013  . Chronic diastolic heart failure ([place]) 12/28/2013  . Overweight (BMI 25.0-29.9) 12/28/2013  . Depression 12/24/2013  . Hypersomnolence 03/12/2013  . Arthritis 12/06/2012  . Unstable angina ([place]) 06/07/2012  . CAD (coronary artery disease) of artery bypass graft   . Hx of CABG 06/05/2012  . Anemia 05/27/2011  . Diabetes mellitus type 2, insulin dependent ([place]) 12/22/2010  . Dementia 12/22/2010  . B12 deficiency 12/22/2010  . Type 2 diabetes mellitus with diabetic neuropathy, unspecified ([place]) 08/28/2008  . MURMUR 07/04/2008  . AMAUROSIS FUGAX 08/12/2007  . Hyperlipidemia 06/22/2007  . Essential hypertension 06/22/2007  . Peripheral vascular disease ([place]) 06/21/2007  . CAROTID BRUIT 06/21/2007   Past Medical History:  Past Medical History:  Diagnosis Date  . Acute renal insufficiency 12/24/2013  . Amaurosis fugax   . Arthritis 12/06/2012  . Atherosclerosis   . CAD (coronary artery disease)    a. s/p CABG x 4 (LIMA->LAD, VG->Diag, VG->OM1->OM3);  b. 2010 Cath: 3/4 patent grafts (VG->diag occluded), 3vd, sev PVD ->Med Rx;  c. 12/2011 Lexi MV: sm area of mild mid and apical ant wall ischemia ->med rx.  . Carotid bruit   . Chronic kidney disease (CKD) stage G4/A1, severely decreased glomerular filtration  rate (GFR) between 15-29 mL/min/1.73 square meter and albuminuria creatinine ratio less than 30 mg/g (HCC)    GFR 25 on 12/25/13  . CVA (cerebral infarction)   . Dementia    pt denies  . Depression 12/24/2013  . Diabetes mellitus type II    insulin dependent.   . Diabetic peripheral neuropathy ([place])   . Diabetic retinopathy   . Diverticulosis   . Duodenal ulcer 2009 and 11/2011   caused GI bleeding  . Gallstones 2009   seen on CT scan  . GI bleed 04/2011, 11/2011   found non-bleeding duodenal ulcers  . Hyperlipidemia   . Hypersomnolence 03/12/2013  . Hypertension   . Iron deficiency anemia secondary to blood loss (chronic)   . Melena 1015/2015   PEPTIC ULCER    REQUIRING TRANSFUSION  . Pedal edema 10/12/2016  . PVD (peripheral vascular disease) ([place])   . Rib fracture 04/21/2015  . Thiamine deficiency 08/13/2015  . UTI (urinary tract infection) 04/08/2014  . Weight loss, non-intentional 01/29/2014   Past Surgical History:  Past Surgical History:  Procedure Laterality Date  . ABDOMINAL HYSTERECTOMY    . COLONOSCOPY  05/20/2011   Procedure: COLONOSCOPY;  Surgeon: Scarlette Shorts, MD;  Location: [facility];  Service: Endoscopy;  Laterality: N/A;  . CORONARY ARTERY BYPASS GRAFT  1999   x 4  . ENTEROSCOPY N/A 12/29/2013   Procedure: ENTEROSCOPY;  Surgeon: Inda Castle, MD;  Location: [facility];  Service: Endoscopy;  Laterality: N/A;  . ESOPHAGOGASTRODUODENOSCOPY  05/16/2011   Procedure: ESOPHAGOGASTRODUODENOSCOPY (EGD);  Surgeon: Gatha Mayer, MD;  Location: [facility] ENDOSCOPY;  Service: Endoscopy;  Laterality: N/A;  .  ESOPHAGOGASTRODUODENOSCOPY  12/08/2011   Procedure: ESOPHAGOGASTRODUODENOSCOPY (EGD);  Surgeon: Ladene Artist, MD,FACG;  Location: [facility] ENDOSCOPY;  Service: Endoscopy;  Laterality: N/A;  . UPPER GASTROINTESTINAL ENDOSCOPY  02/22/2008   w/biopsy, duedenal ulcer, antrum erosions   HPI:  Destiny Fagerstrom Brooksis an 83 y.o.femalewith past medical history of coronary artery disease  status post CABG, hypertension, diabetes, GI bleeding, TIAs, CKD, mild dementiawho presents with sudden onset aphasia noticed her on 6:30 - 7 pm.  TPA administered.     Assessment / Plan / Recommendation Clinical Impression   Pt presents with baseline cognitive-linguistic deficits.  Pt has word finding difficulty and decreased storage and retrieval of basic information, both of which family reports to be back to baseline.  Pt has a hired caregiver 4-5 hours per day prior to admission to assist in basic ADLs.  Given that pt is back to baseline for cognition and language, no further ST services are indicated at this time.  Pt and family are in agreement.      SLP Assessment  SLP Recommendation/Assessment: Patient does not need any further Speech Lanaguage Pathology Services             SLP Evaluation Cognition  Overall Cognitive Status: History of cognitive impairments - at baseline Arousal/Alertness: Awake/alert Orientation Level: Oriented to person;Oriented to place Attention: Sustained Sustained Attention: Appears intact Memory: Impaired Memory Impairment: Storage deficit       Comprehension  Auditory Comprehension Overall Auditory Comprehension: Appears within functional limits for tasks assessed    Expression Expression Primary Mode of Expression: Verbal Verbal Expression Overall Verbal Expression: Impaired at baseline Initiation: No impairment Level of Generative/Spontaneous Verbalization: Conversation Naming: Impairment Responsive: 76-100% accurate Confrontation: Impaired Convergent: 75-100% accurate Divergent: 50-74% accurate Other Verbal Expression Comments: word finding deficits consistent with dementia   Oral / Motor  Oral Motor/Sensory Function Overall Oral Motor/Sensory Function: Within functional limits Motor Speech Overall Motor Speech: Appears within functional limits for tasks assessed   GO                    Emilio Math 02/02/2017, 3:50 PM

## 2017-02-02 NOTE — Progress Notes (Signed)
I spoke with CT about pt. They will send transport around 10pm for her to go to [place].

## 2017-02-02 NOTE — Progress Notes (Signed)
Per report from family, pt recently seen family MD and prescribe meds for UTI this past weekend but pt did not take any of the medication.  Patient's family request for UA to be sent to rule out UTI at this time. Attending MD paged.    Ave Filter, RN

## 2017-02-02 NOTE — Progress Notes (Signed)
Pt is alert and oriented. Family at bedside. Passcode "[place]" established. Will continue to monitor.

## 2017-02-03 ENCOUNTER — Inpatient Hospital Stay (HOSPITAL_COMMUNITY): Payer: Medicare Other

## 2017-02-03 DIAGNOSIS — I6523 Occlusion and stenosis of bilateral carotid arteries: Secondary | ICD-10-CM

## 2017-02-03 DIAGNOSIS — N3000 Acute cystitis without hematuria: Secondary | ICD-10-CM

## 2017-02-03 DIAGNOSIS — G459 Transient cerebral ischemic attack, unspecified: Secondary | ICD-10-CM

## 2017-02-03 LAB — BASIC METABOLIC PANEL
Anion gap: 7 (ref 5–15)
BUN: 43 mg/dL — ABNORMAL HIGH (ref 6–20)
CO2: 24 mmol/L (ref 22–32)
Calcium: 8.7 mg/dL — ABNORMAL LOW (ref 8.9–10.3)
Chloride: 108 mmol/L (ref 101–111)
Creatinine, Ser: 1.95 mg/dL — ABNORMAL HIGH (ref 0.44–1.00)
GFR calc non Af Amer: 23 mL/min — ABNORMAL LOW (ref >60)
GFR, EST AFRICAN AMERICAN: 26 mL/min — AB (ref >60)
Glucose, Bld: 170 mg/dL — ABNORMAL HIGH (ref 65–99)
POTASSIUM: 4 mmol/L (ref 3.5–5.1)
SODIUM: 139 mmol/L (ref 135–145)

## 2017-02-03 LAB — GLUCOSE, CAPILLARY
GLUCOSE-CAPILLARY: 160 mg/dL — AB (ref 65–99)
GLUCOSE-CAPILLARY: 211 mg/dL — AB (ref 65–99)
GLUCOSE-CAPILLARY: 161 mg/dL — AB (ref 65–99)

## 2017-02-03 LAB — CBC
HEMATOCRIT: 32.5 % — AB (ref 36.0–46.0)
HEMOGLOBIN: 10.8 g/dL — AB (ref 12.0–15.0)
MCH: 30.6 pg (ref 26.0–34.0)
MCHC: 33.2 g/dL (ref 30.0–36.0)
MCV: 92.1 fL (ref 78.0–100.0)
Platelets: 164 10*3/uL (ref 150–400)
RBC: 3.53 MIL/uL — AB (ref 3.87–5.11)
RDW: 13.2 % (ref 11.5–15.5)
WBC: 7.9 10*3/uL (ref 4.0–10.5)

## 2017-02-03 MED ORDER — CEFUROXIME AXETIL 500 MG PO TABS: 500.0000 mg | ORAL_TABLET | ORAL | 0 refills | Status: DC

## 2017-02-03 MED ORDER — CLOPIDOGREL BISULFATE 75 MG PO TABS: 75.0000 mg | ORAL_TABLET | Freq: Every day | ORAL | Status: DC

## 2017-02-03 MED ORDER — CEFUROXIME AXETIL 500 MG PO TABS
500.0000 mg | ORAL_TABLET | ORAL | Status: DC
  Administered 2017-02-03: 500 mg via ORAL
  Filled 2017-02-03: qty 1

## 2017-02-03 MED FILL — CEFUROXIME AXETIL 500 MG TA: 500 | 6 days supply | Qty: 6 | Fill #0

## 2017-02-03 NOTE — Progress Notes (Signed)
STROKE TEAM PROGRESS NOTE   SUBJECTIVE (INTERVAL HISTORY) Destiny [name] daughter-in-law is at the bedside. No acute event overnight. CUS still pending. Repeat CT head showed no bleeding. plavix resumed. UA showed UTI and according to Destiny [name] sensitivity, Abx started. Pending PT/OT.   OBJECTIVE Temp:  [97.6 F (36.4 C)-98.6 F (37 C)] 98 F (36.7 C) (11/21 0800) Pulse Rate:  [48-55] 51 (11/21 1000) Cardiac Rhythm: Sinus bradycardia (11/21 0800) Resp:  [11-29] 24 (11/21 1000) BP: (113-175)/(52-129) 165/54 (11/21 1000) SpO2:  [95 %-100 %] 98 % (11/21 1000)  Recent Labs  Lab 02/01/17 2113 02/02/17 1228 02/02/17 1644 02/02/17 2211 02/03/17 0740  GLUCAP 162* 171* 267* 208* 160*   Recent Labs  Lab 02/01/17 2110 02/01/17 2117 02/03/17 0738  NA 138 140 139  K 3.7 3.7 4.0  CL 105 106 108  CO2 24  --  24  GLUCOSE 183* 179* 170*  BUN 51* 47* 43*  CREATININE 2.37* 2.30* 1.95*  CALCIUM 9.2  --  8.7*   Recent Labs  Lab 02/01/17 2110  AST 18  ALT 13*  ALKPHOS 70  BILITOT 0.8  PROT 6.7  ALBUMIN 3.5   Recent Labs  Lab 02/01/17 2110 02/01/17 2117 02/03/17 0738  WBC 9.7  --  7.9  NEUTROABS 5.8  --   --   HGB 11.9* 11.6* 10.8*  HCT 35.5* 34.0* 32.5*  MCV 92.0  --  92.1  PLT 194  --  164   No results for input(s): CKTOTAL, CKMB, CKMBINDEX, TROPONINI in the last 168 hours. Recent Labs    02/01/17 2110  LABPROT 14.1  INR 1.10   Recent Labs    02/02/17 1939  COLORURINE YELLOW  LABSPEC 1.013  PHURINE 5.0  GLUCOSEU NEGATIVE  HGBUR SMALL*  BILIRUBINUR NEGATIVE  KETONESUR NEGATIVE  PROTEINUR 100*  NITRITE NEGATIVE  LEUKOCYTESUR LARGE*       Component Value Date/Time   CHOL 167 02/02/2017 0317   TRIG 272 (H) 02/02/2017 0317   HDL 36 (L) 02/02/2017 0317   CHOLHDL 4.6 02/02/2017 0317   VLDL 54 (H) 02/02/2017 0317   LDLCALC 77 02/02/2017 0317   Lab Results  Component Value Date   HGBA1C 8.4 (H) 02/02/2017   No results found for: LABOPIA, COCAINSCRNUR, LABBENZ,  AMPHETMU, THCU, LABBARB  No results for input(s): ETH in the last 168 hours.  I have personally reviewed the radiological images below and agree with the radiology interpretations.  Mri and Mra head and Neck Wo Contrast 02/02/2017 IMPRESSION: MRI HEAD IMPRESSION: 1. No acute intracranial abnormality.  No acute infarct. 2. Atrophy with multiple chronic lacunar infarcts involving the bilateral basal ganglia, with additional remote right PCA territory infarct. Underlying chronic microvascular ischemic disease. MRA HEAD IMPRESSION: 1. Negative MRA for large vessel occlusion. 2. Moderate to severe intracranial atherosclerotic disease as above, most severe within the posterior circulation. Overall, changes have mildly progressed relative to 2012. MRA NECK IMPRESSION: 1. Atherosclerotic change about the carotid bifurcations, with probable mild narrowing on the right and more moderate to advanced narrowing on the left. Attenuation of the left ICA beyond the bifurcation suggest a hemodynamically significant stenosis. Exact quantification difficult given low resolution and lack of IV contrast. Correlation with carotid Doppler ultrasound and/or CTA would likely be helpful for further evaluation. 2. Patent vertebral arteries within the neck. Left vertebral artery dominant. Right vertebral artery hypoplastic and terminates in PICA.   Ct Head Code Stroke Wo Contrast 02/01/2017 IMPRESSION: 1. No acute intracranial abnormality identified. 2. Stable chronic lacunar  infarcts of basal ganglia, small cortical infarctions in right parietal and occipital lobes, parenchymal volume loss of the brain, and chronic microvascular ischemic changes of white matter. 3. ASPECTS is 10   Carotid Doppler  pending  2D Echocardiogram 01/06/17 - Left ventricle: The cavity size was normal. Wall thickness was   increased in a pattern of moderate LVH. Systolic function was   normal. The estimated ejection fraction was in the range of  55%   to 60%. Features are consistent with a pseudonormal left   ventricular filling pattern, with concomitant abnormal relaxation   and increased filling pressure (grade 2 diastolic dysfunction). - Left atrium: The atrium was mildly dilated. - Pulmonary arteries: Systolic pressure was moderately increased.   PA peak pressure: 51 mm Hg (S).  EEG This awake and asleep EEG is abnormal due to the presence of: 1. Moderate diffuse slowing of the waking background 2. Focal slowing over the left hemisphere Clinical Correlation of the above findings indicates diffuse cerebral dysfunction that is non-specific in etiology and can be seen with hypoxic/ischemic injury, toxic/metabolic encephalopathies, neurodegenerative disorders, or medication effect. Focal slowing over the left hemisphere indicates focal cerebral dysfunction in this region suggestive of underlying structural or physiologic abnormality.  The absence of epileptiform discharges does not rule out a clinical diagnosis of epilepsy.  Clinical correlation is advised.  Ct Head Wo Contrast 02/02/2017 IMPRESSION: 1. No interval hemorrhage or large acute stroke. 2. Stable chronic microvascular ischemic changes and parenchymal volume loss of the brain. 3. Stable chronic lacunar infarcts in bilateral basal ganglia and within the right parietooccipital lobe.    PHYSICAL EXAM  Temp:  [97.6 F (36.4 C)-98.6 F (37 C)] 98 F (36.7 C) (11/21 0800) Pulse Rate:  [48-55] 51 (11/21 1000) Resp:  [11-29] 24 (11/21 1000) BP: (113-175)/(52-129) 165/54 (11/21 1000) SpO2:  [95 %-100 %] 98 % (11/21 1000)  General - Well nourished, well developed, in no apparent distress.  Ophthalmologic - fundi not visualized due to noncooperation.  Cardiovascular - Regular rate and rhythm with no murmur.  Mental Status -  Level of arousal and orientation to place, and person were intact, however, not orientated to time or age. Language including expression,  repetition, comprehension was assessed and found intact, naming 2/4.  Cranial Nerves II - XII - II - Visual field intact OU. III, IV, VI - Extraocular movements intact. V - Facial sensation intact bilaterally. VII - Facial movement intact bilaterally. VIII - Hearing & vestibular intact bilaterally. X - Palate elevates symmetrically. XI - Chin turning & shoulder shrug intact bilaterally. XII - Tongue protrusion intact.  Motor Strength - The patient's strength was symmetrical in all extremities and pronator drift was absent.  Bulk was normal and fasciculations were absent.   Motor Tone - Muscle tone was assessed at the neck and appendages and was normal.  Reflexes - The patient's reflexes were symmetrical in all extremities and she had no pathological reflexes.  Sensory - Light touch, temperature/pinprick were assessed and were symmetrical.    Coordination - not cooperative on exam.  Tremor was absent.  Gait and Station - deferred.   ASSESSMENT/PLAN Ms. Destiny [name] is a 83 y.o. female with history of dementia off aricept, CAD s/p CABG, CKD, DM, HTN, HLD, PVD, carotid disease, TIAs vs. Syncope admitted for episode of aphasia and confusion. TPA given.    TIA most likely given stroke risk factors and possible left ICA stenosis. Seizure still in DDx  Resultant back to baseline  MRI  No acute infarct but chronic b/l BG, right PCA infarct   MRA head Posterior circulation stenosis including BA, PCAs and right VA hypoplastic. Left siphon atherosclerosis  MRA neck - possible left ICA possible high grade stenosis  CT repeat 24h after tPA - no bleeding  Carotid Doppler  pending  2D Echo  12/2016 EF 55-60%  EEG left focal slowing  LDL 77  HgbA1c 8.4  SCDs for VTE prophylaxis  Diet Carb Modified Fluid consistency: Thin; Room service appropriate? Yes   clopidogrel 75 mg daily prior to admission, now on No antithrombotic within 24h of tPA  Patient counseled to be compliant  with Destiny [name] antithrombotic medications  Ongoing aggressive stroke risk factor management  Therapy recommendations:  pending  Disposition:  Pending  Left ICA stenosis  Right ICA 50% in 2009  MRA neck right ICA mild stenosis this time  MRA neck concerning for possible left ICA high grade stenosis  CUS pending  Will discuss with family and consider VVS consult if left ICA stenosis confirmed.   ? Seizure  EEG left focal slowing, no seizure  Hx of syncope vs. Seizure 11/2012 - EEG normal at that time  Currently neuro intact at baseline  No AED necessary at this time.  Diabetes  HgbA1c 8.4 goal < 7.0  Uncontrolled  Currently on lantus  SSI  CBG monitoring  Dietic diet and follow up with PCP  Hypertension Stable on the high side On home norvasc 5mg  and imdur 30/60 gradually normalized within 5-7 days.  Long term BP goal depends on the degree of left ICA stenosis   Hyperlipidemia  Home meds:  lipitor 20   LDL 77, goal < 70  Now on lipitor 20  Continue statin at discharge  UTI  01/26/17 UA WBC TNTC  02/02/17 UA WBC TNTC  Urine culture pan sensitive E.Coli  PCP prescribed omnicef but not started yet  Will put on ceftin this time for 5-7 days.  Other Stroke Risk Factors  Advanced age  Hx stroke/TIAs - by imaging and history  Coronary artery disease s/p CABG  PVD  Other Active Problems  CKD stage III  Dementia off aricept  Hospital day # 2  This patient is critically ill due to stroke like symptoms s/p tPA, left ICA possible high grade stenosis, DM, CKD and at significant risk of neurological worsening, death form recurrent stroke, hemorrhagic conversion, heart failure, seizure. This patient's care requires constant monitoring of vital signs, hemodynamics, respiratory and cardiac monitoring, review of multiple databases, neurological assessment, discussion with family, other specialists and medical decision making of high complexity. I had  long discussion with daughter in law and pt at bedside, updated pt current condition, treatment plan and potential prognosis. They expressed understanding and appreciation. I spent 35 minutes of neurocritical care time in the care of this patient.   Rosalin Hawking, MD PhD Stroke Neurology 02/03/2017 10:27 AM    To contact Stroke Continuity provider, please refer to [email or web address] After hours, contact General Neurology

## 2017-02-03 NOTE — Progress Notes (Signed)
Discharge orders received, pt for discharge home today with home health PT per [facility], IV D/C.  D/C instructions and Rx given with verbalized understanding.  Family at bedside to assist pt with discharge. Staff brought pt downstairs via wheelchair.

## 2017-02-03 NOTE — Progress Notes (Signed)
Order obtained for Aspirin to be given before midnight from [place].

## 2017-02-03 NOTE — Progress Notes (Signed)
Occupational Therapy Evaluation Patient Details Name: Destiny [name] MRN: [id number] DOB: April 24, 1931 Today's Date: 02/03/2017    History of Present Illness 83 y.o. female with history of dementia off aricept, CAD s/p CABG, CKD, DM, HTN, HLD, PVD, carotid disease, TIAs vs. Syncope admitted for episode of aphasia and confusion. TPA given   Clinical Impression   PTA, pt lived at home alone and had a PCA @ 4hrs/day and family to assist as needed. Pt currently requires Min A with mobility and ADL. Discussed pt's current level of function and recommendation for initial 24/7 S with daughter in law and she states that can be arranged. Pt safe to DC home with 24/7 S, RW, 3in1 and HHOT/PT when medically stable. All further OT to be addressed by [facility].     Follow Up Recommendations  Home health OT;Supervision/Assistance - 24 hour    Equipment Recommendations  3 in 1 bedside commode;Other (comment)(RW)    Recommendations for Other Services       Precautions / Restrictions Precautions Precautions: Fall      Mobility Bed Mobility Overal bed mobility: Modified Independent                Transfers Overall transfer level: Needs assistance   Transfers: Sit to/from Stand Sit to Stand: Min assist              Balance Overall balance assessment: Needs assistance;History of Falls   Sitting balance-Leahy Scale: Good       Standing balance-Leahy Scale: Fair                             ADL either performed or assessed with clinical judgement   ADL Overall ADL's : Needs assistance/impaired                                     Functional mobility during ADLs: Minimal assistance General ADL Comments: Pt completed ADL task standing at [facility] and required min A to maintain balance and Mod A at times to prevent falls due to LOB. Pt attempitng to use toothpaste as soap x 2 and required vc to correct. Pt required vc to complete task due to apparent decreased  attention and STM deficits.      Vision Baseline Vision/History: Wears glasses Wears Glasses: At all times Vision Assessment?: No apparent visual deficits     Perception     Praxis      Pertinent Vitals/Pain Pain Assessment: No/denies pain     Hand Dominance Right   Extremity/Trunk Assessment Upper Extremity Assessment Upper Extremity Assessment: Overall WFL for tasks assessed   Lower Extremity Assessment Lower Extremity Assessment: Defer to PT evaluation   Cervical / Trunk Assessment Cervical / Trunk Assessment: Normal   Communication Communication Communication: Expressive difficulties   Cognition Arousal/Alertness: Awake/alert Behavior During Therapy: WFL for tasks assessed/performed Overall Cognitive Status: No family/caregiver present to determine baseline cognitive functioning                                 General Comments: hx of dementai; pt not oriented to month, year or situation; unable ot give address; vc for sequencing at times during ADL - attempting using toothpaste as soap   General Comments       Exercises     Shoulder Instructions  Home Living Family/patient expects to be discharged to:: Private residence Living Arrangements: Alone Available Help at Discharge: Family;Personal care attendant;Available 24 hours/day Type of Home: House Home Access: Stairs to enter [employer] of Steps: 3 Entrance Stairs-Rails: None Home Layout: One level     Bathroom Shower/Tub: Teacher, early years/pre: Standard Bathroom Accessibility: Yes How Accessible: Accessible via walker Home Equipment: None   Additional Comments: confirmed with daughter  Lives With: Alone    Prior Functioning/Environment Level of Independence: Needs assistance  Gait / Transfers Assistance Needed: per family - falls ADL's / Homemaking Assistance Needed: PCA S ADL            OT Problem List: Impaired balance (sitting and/or  standing);Decreased safety awareness;Decreased cognition;Decreased knowledge of use of DME or AE;Obesity      OT Treatment/Interventions:      OT Goals(Current goals can be found in the care plan section) Acute Rehab OT Goals Patient Stated Goal: to go home OT Goal Formulation: All assessment and education complete, DC therapy  OT Frequency:     Barriers to D/C:            Co-evaluation              AM-PAC PT "6 Clicks" Daily Activity     Outcome Measure Help from another person eating meals?: None Help from another person taking care of personal grooming?: A Little Help from another person toileting, which includes using toliet, bedpan, or urinal?: A Little Help from another person bathing (including washing, rinsing, drying)?: A Little Help from another person to put on and taking off regular upper body clothing?: None Help from another person to put on and taking off regular lower body clothing?: A Little 6 Click Score: 20   End of Session Nurse Communication: Mobility status;Other (comment)(DC needs)  Activity Tolerance: Patient tolerated treatment well Patient left: in bed;with call bell/phone within reach;Other (comment)(with vascular)  OT Visit Diagnosis: Unsteadiness on feet (R26.81);Other symptoms and signs involving cognitive function                Time: 1030-1055 OT Time Calculation (min): 25 min Charges:  OT General Charges $OT Visit: 1 Visit OT Evaluation $OT Eval Moderate Complexity: 1 Mod OT Treatments $Self Care/Home Management : 8-22 mins G-Codes:     [facility], OT/L  [phone] 02/03/2017  Anthonie Lotito,HILLARY 02/03/2017, 11:25 AM

## 2017-02-03 NOTE — Discharge Summary (Signed)
Stroke Discharge Summary  Patient ID: Destiny [name]   MRN: [id number]      DOB: January 25, 1932  Date of Admission: 02/01/2017 Date of Discharge: 02/03/2017  Attending Physician:  Rosalin Hawking, MD, Stroke MD Consultant(s):    none  Patient's PCP:  Mosie Lukes, MD  DISCHARGE DIAGNOSIS:  TIA carotid stenosis ? Seizure DM HTN HLD UTI CKD Dementia   Past Medical History:  Diagnosis Date  . Acute renal insufficiency 12/24/2013  . Amaurosis fugax   . Arthritis 12/06/2012  . Atherosclerosis   . CAD (coronary artery disease)    a. s/p CABG x 4 (LIMA->LAD, VG->Diag, VG->OM1->OM3);  b. 2010 Cath: 3/4 patent grafts (VG->diag occluded), 3vd, sev PVD ->Med Rx;  c. 12/2011 Lexi MV: sm area of mild mid and apical ant wall ischemia ->med rx.  . Carotid bruit   . Chronic kidney disease (CKD) stage G4/A1, severely decreased glomerular filtration rate (GFR) between 15-29 mL/min/1.73 square meter and albuminuria creatinine ratio less than 30 mg/g (HCC)    GFR 25 on 12/25/13  . CVA (cerebral infarction)   . Dementia    pt denies  . Depression 12/24/2013  . Diabetes mellitus type II    insulin dependent.   . Diabetic peripheral neuropathy ([place])   . Diabetic retinopathy   . Diverticulosis   . Duodenal ulcer 2009 and 11/2011   caused GI bleeding  . Gallstones 2009   seen on CT scan  . GI bleed 04/2011, 11/2011   found non-bleeding duodenal ulcers  . Hyperlipidemia   . Hypersomnolence 03/12/2013  . Hypertension   . Iron deficiency anemia secondary to blood loss (chronic)   . Melena 1015/2015   PEPTIC ULCER    REQUIRING TRANSFUSION  . Pedal edema 10/12/2016  . PVD (peripheral vascular disease) ([place])   . Rib fracture 04/21/2015  . Thiamine deficiency 08/13/2015  . UTI (urinary tract infection) 04/08/2014  . Weight loss, non-intentional 01/29/2014   Past Surgical History:  Procedure Laterality Date  . ABDOMINAL HYSTERECTOMY    . COLONOSCOPY  05/20/2011   Procedure: COLONOSCOPY;   Surgeon: Scarlette Shorts, MD;  Location: [place];  Service: Endoscopy;  Laterality: N/A;  . CORONARY ARTERY BYPASS GRAFT  1999   x 4  . ENTEROSCOPY N/A 12/29/2013   Procedure: ENTEROSCOPY;  Surgeon: Inda Castle, MD;  Location: [place];  Service: Endoscopy;  Laterality: N/A;  . ESOPHAGOGASTRODUODENOSCOPY  05/16/2011   Procedure: ESOPHAGOGASTRODUODENOSCOPY (EGD);  Surgeon: Gatha Mayer, MD;  Location: [facility] ENDOSCOPY;  Service: Endoscopy;  Laterality: N/A;  . ESOPHAGOGASTRODUODENOSCOPY  12/08/2011   Procedure: ESOPHAGOGASTRODUODENOSCOPY (EGD);  Surgeon: Ladene Artist, MD,FACG;  Location: [facility] ENDOSCOPY;  Service: Endoscopy;  Laterality: N/A;  . UPPER GASTROINTESTINAL ENDOSCOPY  02/22/2008   w/biopsy, duedenal ulcer, antrum erosions    Allergies as of 02/03/2017   No Known Allergies     Medication List    STOP taking these medications   cefdinir 300 MG capsule Commonly known as:  OMNICEF Replaced by:  cefUROXime 500 MG tablet   pantoprazole 40 MG tablet Commonly known as:  PROTONIX     TAKE these medications   amLODipine 5 MG tablet Commonly known as:  NORVASC Take 5 mg by mouth daily.   atorvastatin 20 MG tablet Commonly known as:  LIPITOR Take 20 mg by mouth daily.   cefUROXime 500 MG tablet Commonly known as:  CEFTIN Take 1 tablet (500 mg total) by mouth daily. Start taking on:  02/04/2017  Replaces:  cefdinir 300 MG capsule   citalopram 20 MG tablet Commonly known as:  CELEXA Take 1 tablet (20 mg total) by mouth daily.   clopidogrel 75 MG tablet Commonly known as:  PLAVIX TAKE 1 TABLET(75 MG) BY MOUTH DAILY   ferrous sulfate 325 (65 FE) MG tablet Take 325 mg daily with breakfast by mouth.   Fish Oil 1200 MG Caps Take 1,200 mg by mouth daily.   insulin lispro 100 UNIT/ML KiwkPen Commonly known as:  HUMALOG KWIKPEN Inject 0.05-0.1 mLs (5-10 Units total) into the skin 3 (three) times daily with meals.   isosorbide mononitrate 30 MG 24 hr tablet Commonly  known as:  IMDUR Take 1 tablet (30 mg total) by mouth daily. What changed:  when to take this   isosorbide mononitrate 60 MG 24 hr tablet Commonly known as:  IMDUR TAKE 1 TABLET BY MOUTH EVERY DAY What changed:    how much to take  how to take this  when to take this   LANTUS SOLOSTAR 100 UNIT/ML Solostar Pen Generic drug:  Insulin Glargine ADMINISTER 32 UNITS UNDER THE SKIN DAILY AT 10 PM. INCREASE BY 2 UNITS EVERY 3 DAYS IF ALL FSG>120 What changed:  See the new instructions.   nitroGLYCERIN 0.4 MG SL tablet Commonly known as:  NITROSTAT Place 0.4 mg under the tongue every 5 (five) minutes as needed for chest pain.   ranitidine 300 MG tablet Commonly known as:  ZANTAC TAKE 1 TABLET BY MOUTH EVERY NIGHT AT BEDTIME What changed:    how much to take  how to take this  when to take this   thiamine 100 MG tablet Commonly known as:  VITAMIN B-1 Take 1 tablet (100 mg total) by mouth daily. What changed:    when to take this  additional instructions   triamterene-hydrochlorothiazide 37.5-25 MG tablet Commonly known as:  MAXZIDE-25 Take 0.5 tablets by mouth daily.   Vitamin D 1000 units capsule Take 1,000 Units by mouth every evening.            Durable Medical Equipment  (From admission, onward)        Start     Ordered   02/03/17 1543  For home use only DME Bedside commode  Once    Question:  Patient needs a bedside commode to treat with the following condition  Answer:  Stroke (cerebrum) ([place])   02/03/17 1542   02/03/17 1514  For home use only DME Walker rolling  Once    Question:  Patient needs a walker to treat with the following condition  Answer:  Stroke (cerebrum) ([place])   02/03/17 1515      LABORATORY STUDIES CBC    Component Value Date/Time   WBC 7.9 02/03/2017 0738   RBC 3.53 (L) 02/03/2017 0738   HGB 10.8 (L) 02/03/2017 0738   HCT 32.5 (L) 02/03/2017 0738   PLT 164 02/03/2017 0738   MCV 92.1 02/03/2017 0738   MCH 30.6 02/03/2017  0738   MCHC 33.2 02/03/2017 0738   RDW 13.2 02/03/2017 0738   LYMPHSABS 2.8 02/01/2017 2110   MONOABS 0.7 02/01/2017 2110   EOSABS 0.3 02/01/2017 2110   BASOSABS 0.0 02/01/2017 2110   CMP    Component Value Date/Time   NA 139 02/03/2017 0738   NA 142 06/12/2015   K 4.0 02/03/2017 0738   CL 108 02/03/2017 0738   CO2 24 02/03/2017 0738   GLUCOSE 170 (H) 02/03/2017 0738   BUN 43 (H) 02/03/2017  4403   CREATININE 1.95 (H) 02/03/2017 0738   CREATININE 1.70 (H) 09/13/2013 1102   CALCIUM 8.7 (L) 02/03/2017 0738   PROT 6.7 02/01/2017 2110   ALBUMIN 3.5 02/01/2017 2110   AST 18 02/01/2017 2110   ALT 13 (L) 02/01/2017 2110   ALKPHOS 70 02/01/2017 2110   BILITOT 0.8 02/01/2017 2110   GFRNONAA 23 (L) 02/03/2017 0738   GFRAA 26 (L) 02/03/2017 0738   COAGS Lab Results  Component Value Date   INR 1.10 02/01/2017   INR 1.27 12/28/2013   INR 1.06 11/14/2012   Lipid Panel    Component Value Date/Time   CHOL 167 02/02/2017 0317   TRIG 272 (H) 02/02/2017 0317   HDL 36 (L) 02/02/2017 0317   CHOLHDL 4.6 02/02/2017 0317   VLDL 54 (H) 02/02/2017 0317   LDLCALC 77 02/02/2017 0317   HgbA1C  Lab Results  Component Value Date   HGBA1C 8.4 (H) 02/02/2017   Urinalysis    Component Value Date/Time   COLORURINE YELLOW 02/02/2017 1939   APPEARANCEUR CLOUDY (A) 02/02/2017 1939   LABSPEC 1.013 02/02/2017 1939   PHURINE 5.0 02/02/2017 1939   GLUCOSEU NEGATIVE 02/02/2017 1939   GLUCOSEU 100 (A) 01/26/2017 1114   HGBUR SMALL (A) 02/02/2017 1939   HGBUR large 04/26/2009 1110   BILIRUBINUR NEGATIVE 02/02/2017 1939   BILIRUBINUR neg 01/31/2016 1059   KETONESUR NEGATIVE 02/02/2017 1939   PROTEINUR 100 (A) 02/02/2017 1939   UROBILINOGEN 0.2 01/26/2017 1114   NITRITE NEGATIVE 02/02/2017 1939   LEUKOCYTESUR LARGE (A) 02/02/2017 1939   Urine Drug Screen No results found for: LABOPIA, COCAINSCRNUR, LABBENZ, AMPHETMU, THCU, LABBARB  Alcohol Level No results found for: ETH   SIGNIFICANT  DIAGNOSTIC STUDIES Mri and Mra head and Neck Wo Contrast 02/02/2017 IMPRESSION: MRI HEAD IMPRESSION: 1. No acute intracranial abnormality.  No acute infarct. 2. Atrophy with multiple chronic lacunar infarcts involving the bilateral basal ganglia, with additional remote right PCA territory infarct. Underlying chronic microvascular ischemic disease. MRA HEAD IMPRESSION: 1. Negative MRA for large vessel occlusion. 2. Moderate to severe intracranial atherosclerotic disease as above, most severe within the posterior circulation. Overall, changes have mildly progressed relative to 2012. MRA NECK IMPRESSION: 1. Atherosclerotic change about the carotid bifurcations, with probable mild narrowing on the right and more moderate to advanced narrowing on the left. Attenuation of the left ICA beyond the bifurcation suggest a hemodynamically significant stenosis. Exact quantification difficult given low resolution and lack of IV contrast. Correlation with carotid Doppler ultrasound and/or CTA would likely be helpful for further evaluation. 2. Patent vertebral arteries within the neck. Left vertebral artery dominant. Right vertebral artery hypoplastic and terminates in PICA.   Ct Head Code Stroke Wo Contrast 02/01/2017 IMPRESSION: 1. No acute intracranial abnormality identified. 2. Stable chronic lacunar infarcts of basal ganglia, small cortical infarctions in right parietal and occipital lobes, parenchymal volume loss of the brain, and chronic microvascular ischemic changes of white matter. 3. ASPECTS is 10   CUS  Bilateral 1-39% ICA stenosis, antegrade vertebral flow.  Stenosis likely underestimated due to large amount of calcified plaque and suboptimal visualization of lumen.  Unable to reproduce high grade stenosis noted on prior ultrasound exams.  2D Echocardiogram 01/06/17 - Left ventricle: The cavity size was normal. Wall thickness was increased in a pattern of moderate LVH. Systolic function was normal.  The estimated ejection fraction was in the range of 55% to 60%. Features are consistent with a pseudonormal left ventricular filling pattern, with concomitant abnormal relaxation  and increased filling pressure (grade 2 diastolic dysfunction). - Left atrium: The atrium was mildly dilated. - Pulmonary arteries: Systolic pressure was moderately increased. PA peak pressure: 51 mm Hg (S).  EEG This awake andasleepEEG is abnormal due to the presence of: 1. Moderatediffuse slowing of the waking background 2. Focal slowing over the left hemisphere Clinical Correlation of the above findings indicates diffuse cerebral dysfunction that is non-specific in etiology and can be seen with hypoxic/ischemic injury, toxic/metabolic encephalopathies, neurodegenerative disorders, or medication effect.Focal slowing over the left hemisphere indicates focal cerebral dysfunction in this region suggestive of underlying structural or physiologic abnormality.The absence of epileptiform discharges does not rule out a clinical diagnosis of epilepsy. Clinical correlation is advised.  Ct Head Wo Contrast 02/02/2017 IMPRESSION: 1. No interval hemorrhage or large acute stroke. 2. Stable chronic microvascular ischemic changes and parenchymal volume loss of the brain. 3. Stable chronic lacunar infarcts in bilateral basal ganglia and within the right parietooccipital lobe.     HISTORY OF PRESENT ILLNESS Destiny [name] is an 83 y.o. female with past medical history of coronary artery disease status post CABG, hypertension, diabetes, GI bleeding, TIAs, CKD, mild dementia  who presents with sudden onset aphasia noticed her on 6:30 - 7 pm.  The patient was apparently normal this morning however around 3 PM home health noticed that she had a dazed look and was confused. This lasted for a few minutes and the patient returned to her baseline. Her granddaughter checked on her at 5 PM and grandson around 5:20 PM and she  was perfectly fine and had was having a normal conversation. However son called between 6:30 and 7 PM, she seemed very confused and not making sense. He called 911 and when paramedics arrived it was noted the patient had difficulty with language. Blood pressure was 008 systolic on arrival.   Patient was brought to to [facility] ER around 9 PM. Examination was consistent for isolated aphasia the patient having difficulty naming, repetition and paraphasic errors. CT head was negative. CTA was not performed due to CKD  and low suspicion of large vessel occlusion. After discussing risks versus benefits with family and decided to administer TPA. TPA was administered after lowering blood pressure with labetalol.  Date last known well: 11.19.18 Time last known well: 5.20 pm tPA Given: yes NIHSS: 3 Baseline MRS 2   HOSPITAL COURSE Ms. Destiny [name] is a 83 y.o. female with history of dementia off aricept, CAD s/p CABG, CKD, DM, HTN, HLD, PVD, carotid disease, TIAs vs. Syncope admitted for episode of aphasia and confusion. TPA given.    TIA most likely given stroke risk factors and possible left ICA stenosis. Seizure still in DDx  Resultant back to baseline  MRI  No acute infarct but chronic b/l BG, right PCA infarct   MRA head Posterior circulation stenosis including BA, PCAs and right VA hypoplastic. Left siphon atherosclerosis  MRA neck - possible left ICA possible high grade stenosis  CT repeat 24h after tPA - no bleeding  Carotid Doppler 1-39% stenosis bilaterally  2D Echo  12/2016 EF 55-60%  EEG left focal slowing  LDL 77  HgbA1c 8.4  SCDs for VTE prophylaxis  Diet Carb Modified Fluid consistency: Thin; Room service appropriate? Yes   clopidogrel 75 mg daily prior to admission, now on No antithrombotic within 24h of tPA  Patient counseled to be compliant with her antithrombotic medications  Ongoing aggressive stroke risk factor management  Therapy recommendations:   [facility] PT/OT  Disposition:  home today  Left ICA stenosis  Right ICA 50% in 2009  MRA neck right ICA mild stenosis this time  MRA neck concerning for possible left ICA high grade stenosis  CUS bilateral 1-39% stenosis   discuss with family and no carotid intervention needed, will follow up with PCP annually   ? Seizure  EEG left focal slowing, no seizure  Hx of syncope vs. Seizure 11/2012 - EEG normal at that time  Currently neuro intact at baseline  No AED necessary at this time.  Diabetes  HgbA1c 8.4 goal < 7.0  Uncontrolled  Currently on lantus  SSI  CBG monitoring  Dietic diet and follow up with PCP  Hypertension  Stable on the high side  On home norvasc 5mg  and imdur 30/60  Resume home BP meds on discharge  Long term BP goal normotensive   Hyperlipidemia  Home meds:  lipitor 20   LDL 77, goal < 70  Now on lipitor 20  Continue statin at discharge  UTI  01/26/17 UA WBC TNTC  02/02/17 UA WBC TNTC  Urine culture pan sensitive E.Coli  PCP prescribed omnicef but not started yet  Put on ceftin this time for 5-7 days.  Other Stroke Risk Factors  Advanced age  Hx stroke/TIAs - by imaging and history  Coronary artery disease s/p CABG  PVD  Other Active Problems  CKD stage III  Dementia off aricept   DISCHARGE EXAM Blood pressure (!) 169/62, pulse (!) 53, temperature (!) 97.4 F (36.3 C), temperature source Oral, resp. rate 19, height 5\' 6"  (1.676 m), weight 160 lb 11.5 oz (72.9 kg), SpO2 99 %.  General - Well nourished, well developed, in no apparent distress.  Ophthalmologic - fundi not visualized due to noncooperation.  Cardiovascular - Regular rate and rhythm with no murmur.  Mental Status -  Level of arousal and orientation to place, and person were intact, however, not orientated to time or age. Language including expression, repetition, comprehension was assessed and found intact, naming 2/4.  Cranial  Nerves II - XII - II - Visual field intact OU. III, IV, VI - Extraocular movements intact. V - Facial sensation intact bilaterally. VII - Facial movement intact bilaterally. VIII - Hearing & vestibular intact bilaterally. X - Palate elevates symmetrically. XI - Chin turning & shoulder shrug intact bilaterally. XII - Tongue protrusion intact.  Motor Strength - The patient's strength was symmetrical in all extremities and pronator drift was absent.  Bulk was normal and fasciculations were absent.   Motor Tone - Muscle tone was assessed at the neck and appendages and was normal.  Reflexes - The patient's reflexes were symmetrical in all extremities and she had no pathological reflexes.  Sensory - Light touch, temperature/pinprick were assessed and were symmetrical.    Coordination - not cooperative on exam.  Tremor was absent.  Gait and Station - deferred.   Discharge Diet   Diet Carb Modified Fluid consistency: Thin; Room service appropriate? Yes Diet - low sodium heart healthy liquids  DISCHARGE PLAN  Disposition:  Home with [facility] PT/OT  clopidogrel 75 mg daily for secondary stroke prevention.  Ongoing risk factor control by Primary Care Physician at time of discharge  Follow-up Mosie Lukes, MD in 2 weeks.  Follow-up with [facility] in 6 weeks, office to schedule an appointment.  35 minutes were spent preparing discharge.  Rosalin Hawking, MD PhD Stroke Neurology 02/03/2017 4:06 PM

## 2017-02-03 NOTE — Progress Notes (Signed)
*  PRELIMINARY RESULTS* Vascular Ultrasound Carotid Duplex (Doppler) has been completed.  Preliminary findings: Bilateral 1-39% ICA stenosis, antegrade vertebral flow.  Stenosis likely underestimated due to large amount of calcified plaque and suboptimal visualization of lumen.  Unable to reproduce high grade stenosis noted on prior ultrasound exams.   Everrett Coombe 02/03/2017, 1:05 PM

## 2017-02-03 NOTE — Discharge Instructions (Signed)
Please continue your home medication. We have prescribed the antibiotics for your UTI, called ceftin. Please take one tablet daily for 6 days for completion. We have ordered home health PT and OT, please work hard with them. Any stroke like symptoms, please call 911 for evaluation. We will set up your appointment with [facility] and they will call you for appointment. Please also follow up with your cardiologist and PCP for hospital discharge follow up in 2 weeks.

## 2017-02-03 NOTE — Progress Notes (Signed)
OT Cancellation    02/03/17 0700  OT Visit Information  Last OT Received On 02/03/17  Reason Eval/Treat Not Completed Patient not medically ready (Pt with active bedrest orders. Will await increase in activity orders prior to initiating OT evaluation. Thank you.)   [place], OTR/L Acute Rehab Pager: [phone] Office: [phone]

## 2017-02-03 NOTE — Care Management Note (Signed)
Case Management Note  Patient Details  Name: Destiny [name] MRN: [id number] Date of Birth: [birth year]  Subjective/Objective:  Pt admitted on 02/01/17 with aphasia and confusion; most likely stroke with TPA given.  PTA, pt independent, lives alone.  Pt has personal care attendant 4hrs/day M-F.)               Action/Plan: Pt medically stable for dc home today with family members.  PT/OT recommending home health follow up and DME.  Spoke with pt's daughter in law, Destiny [name], by phone; she is agreeable to plan and prefers [facility] for home health needs.  Referral to [facility] for [facility] and DME needs.  3 in 1 and RW to be delivered to pt's room prior to dc.   Expected Discharge Date:  02/03/17               Expected Discharge Plan:  [place]  In-House Referral:     Discharge planning Services  CM Consult  Post Acute Care Choice:    Choice offered to:  Adult Children  DME Arranged:  3-N-1, [facility] DME Agency:  [place] Arranged:  PT, OT [place] Agency:  [place]  Status of Service:  Completed, signed off  If discussed at [place] of Stay Meetings, dates discussed:    Additional Comments:  Reinaldo Raddle, RN, BSN  Trauma/Neuro ICU Case Manager [phone]

## 2017-02-03 NOTE — Evaluation (Signed)
Physical Therapy Evaluation Patient Details Name: Destiny [name] MRN: [id number] DOB: 1931-08-13 Today's Date: 02/03/2017   History of Present Illness  83 y.o. female with history of dementia off aricept, CAD s/p CABG, CKD, DM, HTN, HLD, PVD, carotid disease, TIAs vs. Syncope admitted for episode of aphasia and confusion. TPA given.  MRI negative post tPA.   Clinical Impression  Pt with fluctuating memory and cognition, up to mod assist during gait for balance checks to prevent falls without an assistive device.  She has a very supportive family who is agreeable to provide needed 24/7 assist/supervision initially upon d/c home and they are interested in what home therapy she could qualify for at discharge.   PT to follow acutely for deficits listed below.       Follow Up Recommendations Home health PT;Supervision/Assistance - 24 hour;Other (comment)(24/7 supervision for the first few days)    Equipment Recommendations  None recommended by PT    Recommendations for Other Services   NA    Precautions / Restrictions Precautions Precautions: Fall Precaution Comments: per family pt is a fall risk at baseline and is reluctant to use RW      Mobility  Bed Mobility Overal bed mobility: Modified Independent                Transfers Overall transfer level: Needs assistance Equipment used: None Transfers: Sit to/from Stand Sit to Stand: Min assist         General transfer comment: Min assist to prevent posterior LOB during transition to stand.   Ambulation/Gait Ambulation/Gait assistance: Mod assist Ambulation Distance (Feet): 150 Feet Assistive device: 1 person hand held assist;None Gait Pattern/deviations: Step-through pattern;Staggering right;Staggering left;Leaning posteriorly     General Gait Details: Up to mod assist for 1-2 significant LOB while pt walking and turning her head.  When in room, pt reaching for furniture for stability.  No RW in room to attempt RW  training/use.        Modified Rankin (Stroke Patients Only) Modified Rankin (Stroke Patients Only) Pre-Morbid Rankin Score: Moderate disability Modified Rankin: Moderately severe disability     Balance Overall balance assessment: Needs assistance;History of Falls Sitting-balance support: Feet supported;No upper extremity supported Sitting balance-Leahy Scale: Good     Standing balance support: Single extremity supported Standing balance-Leahy Scale: Fair                               Pertinent Vitals/Pain Pain Assessment: No/denies pain    Home Living Family/patient expects to be discharged to:: Private residence Living Arrangements: Alone Available Help at Discharge: Family;Personal care attendant;Available 24 hours/day(personal care attendant 4 hours M-F) Type of Home: House Home Access: Stairs to enter Entrance Stairs-Rails: None Entrance Stairs-Number of Steps: 3 Home Layout: One level Home Equipment: None Additional Comments: confirmed with daughter in law.  Has a cat named Pumpkin    Prior Function Level of Independence: Needs assistance   Gait / Transfers Assistance Needed: per family - falls.  They encourage her to use RW, but she thinks she doesn't need it.  She furniture walks in her home.  Pt reports she still drives "in an emergency" family reports they have taken her keys away.   ADL's / Homemaking Assistance Needed: PCA S ADL        Hand Dominance   Dominant Hand: Right    Extremity/Trunk Assessment   Upper Extremity Assessment Upper Extremity Assessment: Defer to OT evaluation  Lower Extremity Assessment Lower Extremity Assessment: Overall WFL for tasks assessed(no focal deficits, strength, ROM, coordination and sensation)    Cervical / Trunk Assessment Cervical / Trunk Assessment: Normal  Communication   Communication: Expressive difficulties  Cognition Arousal/Alertness: Awake/alert Behavior During Therapy: WFL for tasks  assessed/performed Overall Cognitive Status: No family/caregiver present to determine baseline cognitive functioning                                 General Comments: h/o dementia, family seems to think that she was at baseline until I relayed she did not know why she was here and could not recall her cat's name.  Family reports her mental status waxes and wains at baseline as well.  RN reports pt could tell her earlier that "they say I am here for a stroke"      General Comments General comments (skin integrity, edema, etc.): Spoke at length on the phone with pt's daughter in law and she reports memory deficits at baseline and a h/o falls.  This seems worse than pt's normal baseline and OT/PT agree that pt needs 24/7 assist upon initial d/c and this was relayed to daughter in law who reports family can do that.          Assessment/Plan    PT Assessment Patient needs continued PT services  PT Problem List Decreased balance;Decreased mobility;Decreased cognition;Decreased safety awareness;Decreased knowledge of use of DME       PT Treatment Interventions DME instruction;Gait training;Stair training;Functional mobility training;Therapeutic activities;Therapeutic exercise;Balance training;Cognitive remediation;Patient/family education    PT Goals (Current goals can be found in the Care Plan section)  Acute Rehab PT Goals Patient Stated Goal: to go home PT Goal Formulation: With patient/family Time For Goal Achievement: 02/17/17 Potential to Achieve Goals: Good    Frequency Min 4X/week           AM-PAC PT "6 Clicks" Daily Activity  Outcome Measure Difficulty turning over in bed (including adjusting bedclothes, sheets and blankets)?: A Little Difficulty moving from lying on back to sitting on the side of the bed? : A Little Difficulty sitting down on and standing up from a chair with arms (e.g., wheelchair, bedside commode, etc,.)?: Unable Help needed moving to and  from a bed to chair (including a wheelchair)?: A Little Help needed walking in hospital room?: A Lot Help needed climbing 3-5 steps with a railing? : A Little 6 Click Score: 15    End of Session Equipment Utilized During Treatment: Gait belt Activity Tolerance: Patient tolerated treatment well Patient left: in chair;with call bell/phone within reach;with chair alarm set Nurse Communication: Mobility status PT Visit Diagnosis: Unsteadiness on feet (R26.81);Difficulty in walking, not elsewhere classified (R26.2)    Time: 0263-7858 PT Time Calculation (min) (ACUTE ONLY): 31 min   Charges:         Destiny [name], PT, DPT [phone]   PT Evaluation $PT Eval Moderate Complexity: 1 Mod PT Treatments $Gait Training: 8-22 mins   02/03/2017, 1:02 PM

## 2017-02-03 NOTE — Progress Notes (Signed)
I txt paged urinalysis results to [place].

## 2017-02-09 ENCOUNTER — Telehealth: Payer: Self-pay | Admitting: *Deleted

## 2017-02-09 NOTE — Telephone Encounter (Signed)
Received Physician Orders from [facility]; forwarded to provider/SLS 11/27

## 2017-02-10 DIAGNOSIS — E1140 Type 2 diabetes mellitus with diabetic neuropathy, unspecified: Secondary | ICD-10-CM | POA: Diagnosis not present

## 2017-02-10 DIAGNOSIS — R0989 Other specified symptoms and signs involving the circulatory and respiratory systems: Secondary | ICD-10-CM | POA: Diagnosis not present

## 2017-02-10 DIAGNOSIS — N184 Chronic kidney disease, stage 4 (severe): Secondary | ICD-10-CM | POA: Diagnosis not present

## 2017-02-10 DIAGNOSIS — I69320 Aphasia following cerebral infarction: Secondary | ICD-10-CM | POA: Diagnosis not present

## 2017-02-10 DIAGNOSIS — E1151 Type 2 diabetes mellitus with diabetic peripheral angiopathy without gangrene: Secondary | ICD-10-CM | POA: Diagnosis not present

## 2017-02-10 DIAGNOSIS — I25119 Atherosclerotic heart disease of native coronary artery with unspecified angina pectoris: Secondary | ICD-10-CM | POA: Diagnosis not present

## 2017-02-10 DIAGNOSIS — Z951 Presence of aortocoronary bypass graft: Secondary | ICD-10-CM | POA: Diagnosis not present

## 2017-02-10 DIAGNOSIS — Z87891 Personal history of nicotine dependence: Secondary | ICD-10-CM | POA: Diagnosis not present

## 2017-02-10 DIAGNOSIS — E785 Hyperlipidemia, unspecified: Secondary | ICD-10-CM | POA: Diagnosis not present

## 2017-02-10 DIAGNOSIS — E1122 Type 2 diabetes mellitus with diabetic chronic kidney disease: Secondary | ICD-10-CM | POA: Diagnosis not present

## 2017-02-10 DIAGNOSIS — I129 Hypertensive chronic kidney disease with stage 1 through stage 4 chronic kidney disease, or unspecified chronic kidney disease: Secondary | ICD-10-CM | POA: Diagnosis not present

## 2017-02-10 DIAGNOSIS — D631 Anemia in chronic kidney disease: Secondary | ICD-10-CM | POA: Diagnosis not present

## 2017-02-10 DIAGNOSIS — G4710 Hypersomnia, unspecified: Secondary | ICD-10-CM | POA: Diagnosis not present

## 2017-02-10 DIAGNOSIS — E538 Deficiency of other specified B group vitamins: Secondary | ICD-10-CM | POA: Diagnosis not present

## 2017-02-10 DIAGNOSIS — Z794 Long term (current) use of insulin: Secondary | ICD-10-CM | POA: Diagnosis not present

## 2017-02-10 DIAGNOSIS — I5032 Chronic diastolic (congestive) heart failure: Secondary | ICD-10-CM | POA: Diagnosis not present

## 2017-02-10 DIAGNOSIS — N390 Urinary tract infection, site not specified: Secondary | ICD-10-CM | POA: Diagnosis not present

## 2017-02-16 ENCOUNTER — Other Ambulatory Visit: Payer: Self-pay

## 2017-02-16 MED ORDER — RANITIDINE HCL 300 MG PO TABS: ORAL_TABLET | ORAL | 3 refills | Status: DC

## 2017-02-17 DIAGNOSIS — N184 Chronic kidney disease, stage 4 (severe): Secondary | ICD-10-CM | POA: Diagnosis not present

## 2017-02-17 DIAGNOSIS — E785 Hyperlipidemia, unspecified: Secondary | ICD-10-CM | POA: Diagnosis not present

## 2017-02-17 DIAGNOSIS — G4710 Hypersomnia, unspecified: Secondary | ICD-10-CM | POA: Diagnosis not present

## 2017-02-17 DIAGNOSIS — E538 Deficiency of other specified B group vitamins: Secondary | ICD-10-CM | POA: Diagnosis not present

## 2017-02-17 DIAGNOSIS — R0989 Other specified symptoms and signs involving the circulatory and respiratory systems: Secondary | ICD-10-CM | POA: Diagnosis not present

## 2017-02-17 DIAGNOSIS — E1151 Type 2 diabetes mellitus with diabetic peripheral angiopathy without gangrene: Secondary | ICD-10-CM | POA: Diagnosis not present

## 2017-02-17 DIAGNOSIS — Z794 Long term (current) use of insulin: Secondary | ICD-10-CM | POA: Diagnosis not present

## 2017-02-17 DIAGNOSIS — I25119 Atherosclerotic heart disease of native coronary artery with unspecified angina pectoris: Secondary | ICD-10-CM | POA: Diagnosis not present

## 2017-02-17 DIAGNOSIS — I5032 Chronic diastolic (congestive) heart failure: Secondary | ICD-10-CM | POA: Diagnosis not present

## 2017-02-17 DIAGNOSIS — D631 Anemia in chronic kidney disease: Secondary | ICD-10-CM | POA: Diagnosis not present

## 2017-02-17 DIAGNOSIS — E1122 Type 2 diabetes mellitus with diabetic chronic kidney disease: Secondary | ICD-10-CM | POA: Diagnosis not present

## 2017-02-17 DIAGNOSIS — Z951 Presence of aortocoronary bypass graft: Secondary | ICD-10-CM | POA: Diagnosis not present

## 2017-02-17 DIAGNOSIS — E1140 Type 2 diabetes mellitus with diabetic neuropathy, unspecified: Secondary | ICD-10-CM | POA: Diagnosis not present

## 2017-02-17 DIAGNOSIS — Z87891 Personal history of nicotine dependence: Secondary | ICD-10-CM | POA: Diagnosis not present

## 2017-02-17 DIAGNOSIS — I129 Hypertensive chronic kidney disease with stage 1 through stage 4 chronic kidney disease, or unspecified chronic kidney disease: Secondary | ICD-10-CM | POA: Diagnosis not present

## 2017-02-17 DIAGNOSIS — N390 Urinary tract infection, site not specified: Secondary | ICD-10-CM | POA: Diagnosis not present

## 2017-02-17 DIAGNOSIS — I69320 Aphasia following cerebral infarction: Secondary | ICD-10-CM | POA: Diagnosis not present

## 2017-02-18 DIAGNOSIS — I69320 Aphasia following cerebral infarction: Secondary | ICD-10-CM | POA: Diagnosis not present

## 2017-02-18 DIAGNOSIS — Z794 Long term (current) use of insulin: Secondary | ICD-10-CM | POA: Diagnosis not present

## 2017-02-18 DIAGNOSIS — I5032 Chronic diastolic (congestive) heart failure: Secondary | ICD-10-CM | POA: Diagnosis not present

## 2017-02-18 DIAGNOSIS — G4710 Hypersomnia, unspecified: Secondary | ICD-10-CM | POA: Diagnosis not present

## 2017-02-18 DIAGNOSIS — I25119 Atherosclerotic heart disease of native coronary artery with unspecified angina pectoris: Secondary | ICD-10-CM | POA: Diagnosis not present

## 2017-02-18 DIAGNOSIS — R0989 Other specified symptoms and signs involving the circulatory and respiratory systems: Secondary | ICD-10-CM | POA: Diagnosis not present

## 2017-02-18 DIAGNOSIS — E1140 Type 2 diabetes mellitus with diabetic neuropathy, unspecified: Secondary | ICD-10-CM | POA: Diagnosis not present

## 2017-02-18 DIAGNOSIS — N390 Urinary tract infection, site not specified: Secondary | ICD-10-CM | POA: Diagnosis not present

## 2017-02-18 DIAGNOSIS — N184 Chronic kidney disease, stage 4 (severe): Secondary | ICD-10-CM | POA: Diagnosis not present

## 2017-02-18 DIAGNOSIS — E538 Deficiency of other specified B group vitamins: Secondary | ICD-10-CM | POA: Diagnosis not present

## 2017-02-18 DIAGNOSIS — E1122 Type 2 diabetes mellitus with diabetic chronic kidney disease: Secondary | ICD-10-CM | POA: Diagnosis not present

## 2017-02-18 DIAGNOSIS — E785 Hyperlipidemia, unspecified: Secondary | ICD-10-CM | POA: Diagnosis not present

## 2017-02-18 DIAGNOSIS — D631 Anemia in chronic kidney disease: Secondary | ICD-10-CM | POA: Diagnosis not present

## 2017-02-18 DIAGNOSIS — Z951 Presence of aortocoronary bypass graft: Secondary | ICD-10-CM | POA: Diagnosis not present

## 2017-02-18 DIAGNOSIS — I129 Hypertensive chronic kidney disease with stage 1 through stage 4 chronic kidney disease, or unspecified chronic kidney disease: Secondary | ICD-10-CM | POA: Diagnosis not present

## 2017-02-18 DIAGNOSIS — Z87891 Personal history of nicotine dependence: Secondary | ICD-10-CM | POA: Diagnosis not present

## 2017-02-18 DIAGNOSIS — E1151 Type 2 diabetes mellitus with diabetic peripheral angiopathy without gangrene: Secondary | ICD-10-CM | POA: Diagnosis not present

## 2017-02-24 DIAGNOSIS — I5032 Chronic diastolic (congestive) heart failure: Secondary | ICD-10-CM | POA: Diagnosis not present

## 2017-02-24 DIAGNOSIS — E538 Deficiency of other specified B group vitamins: Secondary | ICD-10-CM | POA: Diagnosis not present

## 2017-02-24 DIAGNOSIS — Z951 Presence of aortocoronary bypass graft: Secondary | ICD-10-CM | POA: Diagnosis not present

## 2017-02-24 DIAGNOSIS — I69320 Aphasia following cerebral infarction: Secondary | ICD-10-CM | POA: Diagnosis not present

## 2017-02-24 DIAGNOSIS — R0989 Other specified symptoms and signs involving the circulatory and respiratory systems: Secondary | ICD-10-CM | POA: Diagnosis not present

## 2017-02-24 DIAGNOSIS — I129 Hypertensive chronic kidney disease with stage 1 through stage 4 chronic kidney disease, or unspecified chronic kidney disease: Secondary | ICD-10-CM | POA: Diagnosis not present

## 2017-02-24 DIAGNOSIS — I25119 Atherosclerotic heart disease of native coronary artery with unspecified angina pectoris: Secondary | ICD-10-CM | POA: Diagnosis not present

## 2017-02-24 DIAGNOSIS — E1122 Type 2 diabetes mellitus with diabetic chronic kidney disease: Secondary | ICD-10-CM | POA: Diagnosis not present

## 2017-02-24 DIAGNOSIS — N184 Chronic kidney disease, stage 4 (severe): Secondary | ICD-10-CM | POA: Diagnosis not present

## 2017-02-24 DIAGNOSIS — E1140 Type 2 diabetes mellitus with diabetic neuropathy, unspecified: Secondary | ICD-10-CM | POA: Diagnosis not present

## 2017-02-24 DIAGNOSIS — D631 Anemia in chronic kidney disease: Secondary | ICD-10-CM | POA: Diagnosis not present

## 2017-02-24 DIAGNOSIS — Z794 Long term (current) use of insulin: Secondary | ICD-10-CM | POA: Diagnosis not present

## 2017-02-24 DIAGNOSIS — Z87891 Personal history of nicotine dependence: Secondary | ICD-10-CM | POA: Diagnosis not present

## 2017-02-24 DIAGNOSIS — E1151 Type 2 diabetes mellitus with diabetic peripheral angiopathy without gangrene: Secondary | ICD-10-CM | POA: Diagnosis not present

## 2017-02-24 DIAGNOSIS — G4710 Hypersomnia, unspecified: Secondary | ICD-10-CM | POA: Diagnosis not present

## 2017-02-24 DIAGNOSIS — E785 Hyperlipidemia, unspecified: Secondary | ICD-10-CM | POA: Diagnosis not present

## 2017-02-24 DIAGNOSIS — N390 Urinary tract infection, site not specified: Secondary | ICD-10-CM | POA: Diagnosis not present

## 2017-02-26 MED FILL — raNITIdine HCL 300 MG TABS: 300 | 90 days supply | Qty: 45 | Fill #0

## 2017-02-26 MED FILL — PANTOPRAZOLE SOD DR 40 MG T: 40 | 90 days supply | Qty: 90 | Fill #0

## 2017-02-26 MED FILL — CITALOPRAM HBR 20 MG TABLET: 20 | 90 days supply | Qty: 90 | Fill #0

## 2017-03-01 ENCOUNTER — Ambulatory Visit: Payer: Medicare Other | Admitting: Family Medicine

## 2017-03-02 DIAGNOSIS — N184 Chronic kidney disease, stage 4 (severe): Secondary | ICD-10-CM | POA: Diagnosis not present

## 2017-03-02 DIAGNOSIS — G4710 Hypersomnia, unspecified: Secondary | ICD-10-CM | POA: Diagnosis not present

## 2017-03-02 DIAGNOSIS — Z87891 Personal history of nicotine dependence: Secondary | ICD-10-CM | POA: Diagnosis not present

## 2017-03-02 DIAGNOSIS — I129 Hypertensive chronic kidney disease with stage 1 through stage 4 chronic kidney disease, or unspecified chronic kidney disease: Secondary | ICD-10-CM | POA: Diagnosis not present

## 2017-03-02 DIAGNOSIS — I25119 Atherosclerotic heart disease of native coronary artery with unspecified angina pectoris: Secondary | ICD-10-CM | POA: Diagnosis not present

## 2017-03-02 DIAGNOSIS — E785 Hyperlipidemia, unspecified: Secondary | ICD-10-CM | POA: Diagnosis not present

## 2017-03-02 DIAGNOSIS — Z951 Presence of aortocoronary bypass graft: Secondary | ICD-10-CM | POA: Diagnosis not present

## 2017-03-02 DIAGNOSIS — I69320 Aphasia following cerebral infarction: Secondary | ICD-10-CM | POA: Diagnosis not present

## 2017-03-02 DIAGNOSIS — R0989 Other specified symptoms and signs involving the circulatory and respiratory systems: Secondary | ICD-10-CM | POA: Diagnosis not present

## 2017-03-02 DIAGNOSIS — E538 Deficiency of other specified B group vitamins: Secondary | ICD-10-CM | POA: Diagnosis not present

## 2017-03-02 DIAGNOSIS — N390 Urinary tract infection, site not specified: Secondary | ICD-10-CM | POA: Diagnosis not present

## 2017-03-02 DIAGNOSIS — E1151 Type 2 diabetes mellitus with diabetic peripheral angiopathy without gangrene: Secondary | ICD-10-CM | POA: Diagnosis not present

## 2017-03-02 DIAGNOSIS — Z794 Long term (current) use of insulin: Secondary | ICD-10-CM | POA: Diagnosis not present

## 2017-03-02 DIAGNOSIS — E1122 Type 2 diabetes mellitus with diabetic chronic kidney disease: Secondary | ICD-10-CM | POA: Diagnosis not present

## 2017-03-02 DIAGNOSIS — I5032 Chronic diastolic (congestive) heart failure: Secondary | ICD-10-CM | POA: Diagnosis not present

## 2017-03-02 DIAGNOSIS — E1140 Type 2 diabetes mellitus with diabetic neuropathy, unspecified: Secondary | ICD-10-CM | POA: Diagnosis not present

## 2017-03-02 DIAGNOSIS — D631 Anemia in chronic kidney disease: Secondary | ICD-10-CM | POA: Diagnosis not present

## 2017-03-17 ENCOUNTER — Encounter: Payer: Self-pay | Admitting: Family Medicine

## 2017-03-17 MED ORDER — ISOSORBIDE MONONITRATE ER 60 MG PO TB24: ORAL_TABLET | ORAL | 0 refills | Status: DC

## 2017-03-18 ENCOUNTER — Telehealth: Payer: Self-pay | Admitting: Family Medicine

## 2017-03-18 ENCOUNTER — Telehealth: Payer: Self-pay | Admitting: *Deleted

## 2017-03-18 MED ORDER — ISOSORBIDE MONONITRATE ER 60 MG PO TB24: ORAL_TABLET | ORAL | 0 refills | Status: DC

## 2017-03-18 NOTE — Telephone Encounter (Signed)
Received Physician Orders from [facility]; forwarded to provider/SLS 01/03  

## 2017-03-18 NOTE — Telephone Encounter (Signed)
Should say 60 mg of Imdur daily

## 2017-03-18 NOTE — Addendum Note (Signed)
Addended by: Magdalene Molly A on: 03/18/2017 03:54 PM   Modules accepted: Orders

## 2017-03-18 NOTE — Telephone Encounter (Signed)
Please advise 

## 2017-03-18 NOTE — Telephone Encounter (Signed)
rx sent in 

## 2017-03-18 NOTE — Telephone Encounter (Signed)
Received phone call from Audrea Muscat at [place] regarding [place]. The directions sent in for med as written is "take 160 mg tablet by mouth every day in the morning". Please clarify instructions

## 2017-03-19 MED FILL — ISOSORBIDE MN ER 60 MG TAB: 60 | 90 days supply | Qty: 90 | Fill #0

## 2017-03-20 ENCOUNTER — Encounter: Payer: Self-pay | Admitting: Family Medicine

## 2017-03-22 MED ORDER — TRIAMTERENE-HCTZ 37.5-25 MG PO TABS: 0.5000 | ORAL_TABLET | Freq: Every day | ORAL | 3 refills | Status: DC

## 2017-03-22 MED ORDER — INSULIN GLARGINE 100 UNIT/ML SOLOSTAR PEN: PEN_INJECTOR | SUBCUTANEOUS | 2 refills | Status: DC

## 2017-03-22 MED FILL — TRIAMTERENE/HCTZ 37.5/25 TB: 37.5-25 | 30 days supply | Qty: 15 | Fill #0

## 2017-03-22 MED FILL — LANTUS SOLOSTAR 100 UNITS/M: 100 | 30 days supply | Qty: 12 | Fill #0

## 2017-03-29 ENCOUNTER — Telehealth: Payer: Self-pay | Admitting: *Deleted

## 2017-03-29 NOTE — Telephone Encounter (Signed)
Received Home Health Certification and Plan of Care; forwarded to provider/SLS 01/14  

## 2017-03-30 ENCOUNTER — Telehealth: Payer: Self-pay | Admitting: *Deleted

## 2017-03-30 NOTE — Telephone Encounter (Signed)
Received Physician Orders from [facility]; forwarded to provider/SLS 01/15  

## 2017-04-03 ENCOUNTER — Encounter: Payer: Self-pay | Admitting: Family Medicine

## 2017-04-05 MED ORDER — CLOPIDOGREL BISULFATE 75 MG PO TABS: ORAL_TABLET | ORAL | 1 refills | Status: DC

## 2017-04-05 MED FILL — CLOPIDOGREL 75 MG TABLET: 75 | 90 days supply | Qty: 90 | Fill #0

## 2017-04-14 ENCOUNTER — Ambulatory Visit: Payer: Medicare Other | Admitting: Neurology

## 2017-04-14 ENCOUNTER — Encounter: Payer: Self-pay | Admitting: Neurology

## 2017-04-14 VITALS — BP 141/73 | HR 70 | Wt 161.4 lb

## 2017-04-14 DIAGNOSIS — F0280 Dementia in other diseases classified elsewhere without behavioral disturbance: Secondary | ICD-10-CM | POA: Diagnosis not present

## 2017-04-14 DIAGNOSIS — G301 Alzheimer's disease with late onset: Secondary | ICD-10-CM

## 2017-04-14 DIAGNOSIS — G459 Transient cerebral ischemic attack, unspecified: Secondary | ICD-10-CM

## 2017-04-14 NOTE — Progress Notes (Signed)
[facility] [address] [place]. [place] [address] [phone]       OFFICE FOLLOW-UP NOTE  Destiny. Destiny [name] Date of Birth:  02/09/1932 Medical Record Number:  [id number]   HPI: Destiny [name] is a pleasant 21 year Caucasian lady seen today for first office follow-up visit following hospital admission for stroke in November 2019. History is obtained from the patient, son and caregiver who accompany her today for this visit. I have personally reviewed hospital electronic medical records and imaging films.Destiny Horney Brooksis an 84 y.o.femalewith past medical history of coronary artery disease status post CABG, hypertension, diabetes, GI bleeding, TIAs, CKD, mild dementiawho presents with sudden onset aphasia noticed her on 6:30 - 7 pm.The patient was apparently normal this morning however around 3 PM home health noticed that she had a dazed look and wasconfused.This lasted for a few minutes and the patient returned to her baseline. Her granddaughter checked on her at5 PM and grandson around 5:20 PM and she was perfectly fine and had was having a normal conversation. However son calledbetween 6:30 and 7 PM,she seemed very confused and not making sense. He called 911 and when paramedics arrived it was noted the patient had difficulty with language. Blood pressure was 220 systolic on arrival.Patient was brought to to [facility] ER around9PM. Examination was consistent for isolated aphasiathe patient having difficulty naming, repetition and paraphasic errors. CT head was negative. CTA was not performed due toCKDand low suspicion of largevessel occlusion. After discussing risks versus benefits with family and decided to administer TPA. TPA was administered after lowering blood pressure with labetalol. Date last known well:11.19.18 Time last known well:5.20 pm.tPA Given:yes. NIHSS:3.Baseline MRS2. The patient showed neurological improvement shortly after getting TPA.  She was monitored in intensive care unit and blood pressure was tightly controlled. MRI scan of the brain did not show any acute infarct. MRA of the neck showed possible high-grade left ICA stenosis however carotid ultrasound showed only 1-39% stenosis bilaterally. EEG shows focal left-sided and diffuse generalized slowing but no epileptiform activity. LDL cholesterol was 77 mg percent and hemoglobin A1c was 8.4. Patient was started on Plavix for stroke prevention. She has done well since discharge. She is living at home. She had mild Alzheimer's dementia at baseline. In the past she has not tolerated forgot any benefit from Aricept and Namenda and in fact aspirin her son and she showed improvement after those medications were discontinued. She has a caregiver for 4 hours daily. She is alone at night. Her daughter lives right next door and keeps a close watch on her. Patient is tolerating Plavix without bleeding or bruising. Her blood pressure up appears to be reasonably well controlled and today it is 141/73. Her fasting sugars continue to be high. She is on insulin and to see her medical doctor regularly. She's had no recurrent episodes of speech disturbance or seizure-like activity. She does have a remote history of possible TIA 4 years ago when she had sudden onset of facial drooping and nearly blacked out. This was transient and she refused to go to the hospital. She saw her primary care physician a few days later and brain imaging was unremarkable.   ROS:   14 system review of systems is positive for   memory loss, speech difficulty and all other systems negative. PMH:  Past Medical History:  Diagnosis Date  . Acute renal insufficiency 12/24/2013  . Amaurosis fugax   . Arthritis 12/06/2012  . Atherosclerosis   . CAD (  coronary artery disease)    a. s/p CABG x 4 (LIMA->LAD, VG->Diag, VG->OM1->OM3);  b. 2010 Cath: 3/4 patent grafts (VG->diag occluded), 3vd, sev PVD ->Med Rx;  c. 12/2011 Lexi MV: sm  area of mild mid and apical ant wall ischemia ->med rx.  . Carotid bruit   . Chronic kidney disease (CKD) stage G4/A1, severely decreased glomerular filtration rate (GFR) between 15-29 mL/min/1.73 square meter and albuminuria creatinine ratio less than 30 mg/g (HCC)    GFR 25 on 12/25/13  . CVA (cerebral infarction)   . Dementia    pt denies  . Depression 12/24/2013  . Diabetes mellitus type II    insulin dependent.   . Diabetic peripheral neuropathy ([place])   . Diabetic retinopathy   . Diverticulosis   . Duodenal ulcer 2009 and 11/2011   caused GI bleeding  . Gallstones 2009   seen on CT scan  . GI bleed 05/27/2011, 11/2011   found non-bleeding duodenal ulcers  . Hyperlipidemia   . Hypersomnolence 03/12/2013  . Hypertension   . Iron deficiency anemia secondary to blood loss (chronic)   . Melena 1015/2015   PEPTIC ULCER    REQUIRING TRANSFUSION  . Pedal edema 10/12/2016  . PVD (peripheral vascular disease) ([place])   . Rib fracture 04/21/2015  . Thiamine deficiency 08/13/2015  . UTI (urinary tract infection) 04/08/2014  . Weight loss, non-intentional 01/29/2014    Social History:  Social History   Socioeconomic History  . Marital status: Widowed    Spouse name: Not on file  . Number of children: 4  . Years of education: Not on file  . Highest education level: Not on file  Social Needs  . Financial resource strain: Not on file  . Food insecurity - worry: Not on file  . Food insecurity - inability: Not on file  . Transportation needs - medical: Not on file  . Transportation needs - non-medical: Not on file  Occupational History  . Occupation: retired    Fish farm manager: RETIRED  Tobacco Use  . Smoking status: Former Smoker    Types: Cigarettes    Last attempt to quit: 12/31/2000    Years since quitting: 16.2  . Smokeless tobacco: Never Used  Substance and Sexual Activity  . Alcohol use: No  . Drug use: No  . Sexual activity: Not on file  Other Topics Concern  . Not on file    Social History Narrative   Widowed - husband passed away in 05/26/2009   lives with daughter with epilepsy - mental retardation   Retired     Former Smoker      Alcohol use-no          Occupation: Retired       son - Wilbert Schouten     Medications:   Current Outpatient Medications on File Prior to Visit  Medication Sig Dispense Refill  . amLODipine (NORVASC) 5 MG tablet Take 5 mg by mouth daily.    Marland Kitchen atorvastatin (LIPITOR) 20 MG tablet Take 20 mg by mouth daily.    . Cholecalciferol (VITAMIN D) 1000 UNITS capsule Take 1,000 Units by mouth every evening.     . citalopram (CELEXA) 20 MG tablet Take 1 tablet (20 mg total) by mouth daily. 90 tablet 1  . clopidogrel (PLAVIX) 75 MG tablet TAKE 1 TABLET(75 MG) BY MOUTH DAILY 90 tablet 1  . ferrous sulfate 325 (65 FE) MG tablet Take 325 mg daily with breakfast by mouth.    . Insulin Glargine (  LANTUS SOLOSTAR) 100 UNIT/ML Solostar Pen ADMINISTER 35 UNITS UNDER THE SKIN DAILY AT in the morning. INCREASE BY 2 UNITS EVERY 3 DAYS IF ALL FSG>120 15 mL 2  . insulin lispro (HUMALOG KWIKPEN) 100 UNIT/ML KiwkPen Inject 0.05-0.1 mLs (5-10 Units total) into the skin 3 (three) times daily with meals. 15 mL 4  . isosorbide mononitrate (IMDUR) 60 MG 24 hr tablet TAKE 60 mg TABLET BY MOUTH EVERY DAY in the morning 90 tablet 0  . nitroGLYCERIN (NITROSTAT) 0.4 MG SL tablet Place 0.4 mg under the tongue every 5 (five) minutes as needed for chest pain.    . Omega-3 Fatty Acids (FISH OIL) 1200 MG CAPS Take 1,200 mg by mouth daily.     . ranitidine (ZANTAC) 300 MG tablet Take 1/2 tablet at bedtime 45 tablet 3  . thiamine (VITAMIN B-1) 100 MG tablet Take 1 tablet (100 mg total) by mouth daily. (Patient taking differently: Take 100 mg See admin instructions by mouth. Monday, Wednesday and Friday) 30 tablet 5  . triamterene-hydrochlorothiazide (MAXZIDE-25) 37.5-25 MG tablet Take 0.5 tablets by mouth daily. 15 tablet 3   No current facility-administered medications on file  prior to visit.     Allergies:  No Known Allergies  Physical Exam General: well developed, well nourished elderly Caucasian lady, seated, in no evident distress Head: head normocephalic and atraumatic.  Neck: supple with no carotid or supraclavicular bruits Cardiovascular: regular rate and rhythm, no murmurs Musculoskeletal: no deformity Skin:  no rash/petichiae Vascular:  Normal pulses all extremities Vitals:   04/14/17 1032  BP: (!) 141/73  Pulse: 70   Neurologic Exam Mental Status: Awake and fully alert. Oriented to place and time. Recent and remote memory poort. Attention span, concentration and fund of knowledge diminished Mood and affect appropriate. Recall 0/3. Able to name only 4 animals with 4 legs. Clock drawing 3/4. Cranial Nerves: Fundoscopic exam reveals sharp disc margins. Pupils equal, briskly reactive to light. Extraocular movements full without nystagmus. Visual fields full to confrontation. Hearing intact. Facial sensation intact. Face, tongue, palate moves normally and symmetrically.  Motor: Normal bulk and tone. Normal strength in all tested extremity muscles. Sensory.: intact to touch ,pinprick .position and vibratory sensation.  Coordination: Rapid alternating movements normal in all extremities. Finger-to-nose and heel-to-shin performed accurately bilaterally. Gait and Station: Arises from chair without difficulty. Stance is normal. Gait demonstrates normal stride length and balance . Able to heel, toe and tandem walk without difficulty.  Reflexes: 1+ and symmetric. Toes downgoing.   NIHSS  1 Modified Rankin  1   ASSESSMENT: 84 year old Caucasian lady with transient episode of speech disturbance and expressive aphasia likely left hemispheric TIA.multiple vascular risk factors of diabetes, hypertension, hyperlipidemia and age Complex partial seizure less likely. Long-standing history of mild Alzheimer's dementia    PLAN: I had a long d/w patient about his  recent TIA, mild ementia, risk for recurrent stroke/TIAs, personally independently reviewed imaging studies and stroke evaluation results and answered questions.Continue clopidogrel 75 mg daily  for secondary stroke prevention and maintain strict control of hypertension with blood pressure goal below 130/90, diabetes with hemoglobin A1c goal below 6.5% and lipids with LDL cholesterol goal below 70 mg/dL. I also advised the patient to eat a healthy diet with plenty of whole grains, cereals, fruits and vegetables, exercise regularly and maintain ideal body weight. Patient's mild dementia appears to be stable and she has failed trials of Aricept or Namenda in the past. She may consider possible participation in the [employer]  dementia study if interested Followup in the future with my nurse practitioner Janett Billow in 3 months or call earlier if necessary Greater than 50% of time during this 25 minute visit was spent on counseling,explanation of diagnosis, planning of further management, discussion with patient and family and coordination of care Antony Contras, MD  [facility] [address] [place], [place] [address]  Phone [phone] Fax [phone] Note: This document was prepared with digital dictation and possible smart phrase technology. Any transcriptional errors that result from this process are unintentional

## 2017-04-14 NOTE — Patient Instructions (Signed)
I had a long d/w patient about his recent TIA, mild ementia, risk for recurrent stroke/TIAs, personally independently reviewed imaging studies and stroke evaluation results and answered questions.Continue clopidogrel 75 mg daily  for secondary stroke prevention and maintain strict control of hypertension with blood pressure goal below 130/90, diabetes with hemoglobin A1c goal below 6.5% and lipids with LDL cholesterol goal below 70 mg/dL. I also advised the patient to eat a healthy diet with plenty of whole grains, cereals, fruits and vegetables, exercise regularly and maintain ideal body weight. Patient's mild dementia appears to be stable and she has failed trials of Aricept or Namenda in the past. She may consider possible participation in the [place] dementia study if interested Followup in the future with my nurse practitioner Janett Billow in 3 months or call earlier if necessary   Stroke Prevention Some medical conditions and behaviors are associated with a higher chance of having a stroke. You can help prevent a stroke by making nutrition, lifestyle, and other changes, including managing any medical conditions you may have. What nutrition changes can be made?  Eat healthy foods. You can do this by: ? Choosing foods high in fiber, such as fresh fruits and vegetables and whole grains. ? Eating at least 5 or more servings of fruits and vegetables a day. Try to fill half of your plate at each meal with fruits and vegetables. ? Choosing lean protein foods, such as lean cuts of meat, poultry without skin, fish, tofu, beans, and nuts. ? Eating low-fat dairy products. ? Avoiding foods that are high in salt (sodium). This can help lower blood pressure. ? Avoiding foods that have saturated fat, trans fat, and cholesterol. This can help prevent high cholesterol. ? Avoiding processed and premade foods.  Follow your health care provider's specific guidelines for losing weight, controlling high blood  pressure (hypertension), lowering high cholesterol, and managing diabetes. These may include: ? Reducing your daily calorie intake. ? Limiting your daily sodium intake to 1,500 milligrams (mg). ? Using only healthy fats for cooking, such as olive oil, canola oil, or sunflower oil. ? Counting your daily carbohydrate intake. What lifestyle changes can be made?  Maintain a healthy weight. Talk to your health care provider about your ideal weight.  Get at least 30 minutes of moderate physical activity at least 5 days a week. Moderate activity includes brisk walking, biking, and swimming.  Do not use any products that contain nicotine or tobacco, such as cigarettes and e-cigarettes. If you need help quitting, ask your health care provider. It may also be helpful to avoid exposure to secondhand smoke.  Limit alcohol intake to no more than 1 drink a day for nonpregnant women and 2 drinks a day for men. One drink equals 12 oz of beer, 5 oz of wine, or 1 oz of hard liquor.  Stop any illegal drug use.  Avoid taking birth control pills. Talk to your health care provider about the risks of taking birth control pills if: ? You are over 34 years old. ? You smoke. ? You get migraines. ? You have ever had a blood clot. What other changes can be made?  Manage your cholesterol levels. ? Eating a healthy diet is important for preventing high cholesterol. If cholesterol cannot be managed through diet alone, you may also need to take medicines. ? Take any prescribed medicines to control your cholesterol as told by your health care provider.  Manage your diabetes. ? Eating a healthy diet and exercising regularly are  important parts of managing your blood sugar. If your blood sugar cannot be managed through diet and exercise, you may need to take medicines. ? Take any prescribed medicines to control your diabetes as told by your health care provider.  Control your hypertension. ? To reduce your risk of  stroke, try to keep your blood pressure below 130/80. ? Eating a healthy diet and exercising regularly are an important part of controlling your blood pressure. If your blood pressure cannot be managed through diet and exercise, you may need to take medicines. ? Take any prescribed medicines to control hypertension as told by your health care provider. ? Ask your health care provider if you should monitor your blood pressure at home. ? Have your blood pressure checked every year, even if your blood pressure is normal. Blood pressure increases with age and some medical conditions.  Get evaluated for sleep disorders (sleep apnea). Talk to your health care provider about getting a sleep evaluation if you snore a lot or have excessive sleepiness.  Take over-the-counter and prescription medicines only as told by your health care provider. Aspirin or blood thinners (antiplatelets or anticoagulants) may be recommended to reduce your risk of forming blood clots that can lead to stroke.  Make sure that any other medical conditions you have, such as atrial fibrillation or atherosclerosis, are managed. What are the warning signs of a stroke? The warning signs of a stroke can be easily remembered as BEFAST.  B is for balance. Signs include: ? Dizziness. ? Loss of balance or coordination. ? Sudden trouble walking.  E is for eyes. Signs include: ? A sudden change in vision. ? Trouble seeing.  F is for face. Signs include: ? Sudden weakness or numbness of the face. ? The face or eyelid drooping to one side.  A is for arms. Signs include: ? Sudden weakness or numbness of the arm, usually on one side of the body.  S is for speech. Signs include: ? Trouble speaking (aphasia). ? Trouble understanding.  T is for time. ? These symptoms may represent a serious problem that is an emergency. Do not wait to see if the symptoms will go away. Get medical help right away. Call your local emergency services  (911 in the [place] Do not drive yourself to the hospital.  Other signs of stroke may include: ? A sudden, severe headache with no known cause. ? Nausea or vomiting. ? Seizure.  Where to find more information: For more information, visit:  American Stroke Association: www.strokeassociation.org  National Stroke Association: www.stroke.org  Summary  You can prevent a stroke by eating healthy, exercising, not smoking, limiting alcohol intake, and managing any medical conditions you may have.  Do not use any products that contain nicotine or tobacco, such as cigarettes and e-cigarettes. If you need help quitting, ask your health care provider. It may also be helpful to avoid exposure to secondhand smoke.  Remember BEFAST for warning signs of stroke. Get help right away if you or a loved one has any of these signs. This information is not intended to replace advice given to you by your health care provider. Make sure you discuss any questions you have with your health care provider. Document Released: 04/09/2004 Document Revised: 04/07/2016 Document Reviewed: 04/07/2016 Elsevier Interactive Patient Education  [employer].

## 2017-04-26 MED FILL — TRIAMTERENE/HCTZ 37.5/25 TB: 37.5-25 | 30 days supply | Qty: 15 | Fill #1

## 2017-04-26 MED FILL — LANTUS SOLOSTAR 100 UNITS/M: 100 | 30 days supply | Qty: 12 | Fill #1

## 2017-04-27 ENCOUNTER — Ambulatory Visit: Payer: Medicare Other | Admitting: Family Medicine

## 2017-04-27 ENCOUNTER — Encounter: Payer: Self-pay | Admitting: Family Medicine

## 2017-04-27 ENCOUNTER — Telehealth: Payer: Self-pay | Admitting: Family Medicine

## 2017-04-27 VITALS — BP 118/60 | HR 62 | Temp 97.8°F | Resp 18 | Wt 162.2 lb

## 2017-04-27 DIAGNOSIS — I10 Essential (primary) hypertension: Secondary | ICD-10-CM

## 2017-04-27 DIAGNOSIS — R5381 Other malaise: Secondary | ICD-10-CM | POA: Diagnosis not present

## 2017-04-27 DIAGNOSIS — E782 Mixed hyperlipidemia: Secondary | ICD-10-CM | POA: Diagnosis not present

## 2017-04-27 DIAGNOSIS — R0602 Shortness of breath: Secondary | ICD-10-CM | POA: Diagnosis not present

## 2017-04-27 DIAGNOSIS — Z794 Long term (current) use of insulin: Secondary | ICD-10-CM

## 2017-04-27 DIAGNOSIS — E119 Type 2 diabetes mellitus without complications: Secondary | ICD-10-CM | POA: Diagnosis not present

## 2017-04-27 DIAGNOSIS — F0390 Unspecified dementia without behavioral disturbance: Secondary | ICD-10-CM

## 2017-04-27 DIAGNOSIS — Z8673 Personal history of transient ischemic attack (TIA), and cerebral infarction without residual deficits: Secondary | ICD-10-CM | POA: Diagnosis not present

## 2017-04-27 DIAGNOSIS — F418 Other specified anxiety disorders: Secondary | ICD-10-CM

## 2017-04-27 LAB — COMPREHENSIVE METABOLIC PANEL
ALBUMIN: 3.8 g/dL (ref 3.5–5.2)
ALT: 13 U/L (ref 0–35)
AST: 14 U/L (ref 0–37)
Alkaline Phosphatase: 61 U/L (ref 39–117)
BUN: 50 mg/dL — ABNORMAL HIGH (ref 6–23)
CALCIUM: 9.5 mg/dL (ref 8.4–10.5)
CHLORIDE: 103 meq/L (ref 96–112)
CO2: 28 mEq/L (ref 19–32)
CREATININE: 2.05 mg/dL — AB (ref 0.40–1.20)
GFR: 24.52 mL/min — ABNORMAL LOW (ref >60.00)
Glucose, Bld: 239 mg/dL — ABNORMAL HIGH (ref 70–99)
POTASSIUM: 4.7 meq/L (ref 3.5–5.1)
Sodium: 139 mEq/L (ref 135–145)
Total Bilirubin: 0.9 mg/dL (ref 0.2–1.2)
Total Protein: 6.7 g/dL (ref 6.0–8.3)

## 2017-04-27 MED ORDER — CITALOPRAM HYDROBROMIDE 20 MG PO TABS: 20.0000 mg | ORAL_TABLET | Freq: Every day | ORAL | 1 refills | Status: DC

## 2017-04-27 MED ORDER — ISOSORBIDE MONONITRATE ER 60 MG PO TB24: ORAL_TABLET | ORAL | 0 refills | Status: DC

## 2017-04-27 MED ORDER — ATORVASTATIN CALCIUM 20 MG PO TABS: 20.0000 mg | ORAL_TABLET | Freq: Every day | ORAL | 1 refills | Status: DC

## 2017-04-27 MED ORDER — ISOSORBIDE MONONITRATE ER 30 MG PO TB24: 30.0000 mg | ORAL_TABLET | Freq: Every day | ORAL | 1 refills | Status: DC

## 2017-04-27 MED ORDER — AMLODIPINE BESYLATE 5 MG PO TABS: 5.0000 mg | ORAL_TABLET | Freq: Every day | ORAL | 1 refills | Status: DC

## 2017-04-27 MED FILL — ISOSORBIDE MN ER 30 MG TAB: 30 | 90 days supply | Qty: 90 | Fill #0

## 2017-04-27 MED FILL — ATORVASTATIN 20 MG TABLET: 20 | 90 days supply | Qty: 90 | Fill #0

## 2017-04-27 MED FILL — AMLODIPINE BESYLATE 5 MG TA: 5 | 90 days supply | Qty: 90 | Fill #0

## 2017-04-27 NOTE — Telephone Encounter (Signed)
Copied from [place] [id number]. Topic: Quick Communication - Rx Refill/Question >> Apr 27, 2017  1:57 PM Margot Ables wrote: Medication: isosorbide 30mg  and 60mg   Pt previously on only 60mg . Received RXs for 30mg  and 60mg . Calling to clarify that the 30mg  is in addition to the 60mg . Please advise

## 2017-04-27 NOTE — Progress Notes (Signed)
Subjective:  I acted as a Education administrator for Dr. [name]. Destiny [name], [place]  Patient ID: Destiny [name], female    DOB: 1931/04/20, 84 y.o.   MRN: [id number]  No chief complaint on file.   HPI  Patient is in today for a follow up and she is accompanied by an aide. She feels well. No recent hospitalization or febrile illness. She denies any acute concerns. They report she is eating well. They do acknowledge she is less and less active and now when she exerts she gets more easily SOB. Denies CP/palp/HA/congestion/fevers/GI or GU c/o. Taking meds as prescribed  Patient Care Team: Mosie Lukes, MD as PCP - General (Family Medicine)   Past Medical History:  Diagnosis Date  . Acute renal insufficiency 12/24/2013  . Amaurosis fugax   . Arthritis 12/06/2012  . Atherosclerosis   . CAD (coronary artery disease)    a. s/p CABG x 4 (LIMA->LAD, VG->Diag, VG->OM1->OM3);  b. 2010 Cath: 3/4 patent grafts (VG->diag occluded), 3vd, sev PVD ->Med Rx;  c. 12/2011 Lexi MV: sm area of mild mid and apical ant wall ischemia ->med rx.  . Carotid bruit   . Chronic kidney disease (CKD) stage G4/A1, severely decreased glomerular filtration rate (GFR) between 15-29 mL/min/1.73 square meter and albuminuria creatinine ratio less than 30 mg/g (HCC)    GFR 25 on 12/25/13  . CVA (cerebral infarction)   . Dementia    pt denies  . Depression 12/24/2013  . Diabetes mellitus type II    insulin dependent.   . Diabetic peripheral neuropathy ([place])   . Diabetic retinopathy   . Diverticulosis   . Duodenal ulcer 2009 and 11/2011   caused GI bleeding  . Gallstones 2009   seen on CT scan  . GI bleed 04/2011, 11/2011   found non-bleeding duodenal ulcers  . Hyperlipidemia   . Hypersomnolence 03/12/2013  . Hypertension   . Iron deficiency anemia secondary to blood loss (chronic)   . Melena 1015/2015   PEPTIC ULCER    REQUIRING TRANSFUSION  . Pedal edema 10/12/2016  . PVD (peripheral vascular disease) ([place])   . Rib fracture  04/21/2015  . Thiamine deficiency 08/13/2015  . UTI (urinary tract infection) 04/08/2014  . Weight loss, non-intentional 01/29/2014    Past Surgical History:  Procedure Laterality Date  . ABDOMINAL HYSTERECTOMY    . COLONOSCOPY  05/20/2011   Procedure: COLONOSCOPY;  Surgeon: Scarlette Shorts, MD;  Location: [facility];  Service: Endoscopy;  Laterality: N/A;  . CORONARY ARTERY BYPASS GRAFT  1999   x 4  . ENTEROSCOPY N/A 12/29/2013   Procedure: ENTEROSCOPY;  Surgeon: Inda Castle, MD;  Location: [facility];  Service: Endoscopy;  Laterality: N/A;  . ESOPHAGOGASTRODUODENOSCOPY  05/16/2011   Procedure: ESOPHAGOGASTRODUODENOSCOPY (EGD);  Surgeon: Gatha Mayer, MD;  Location: [facility] ENDOSCOPY;  Service: Endoscopy;  Laterality: N/A;  . ESOPHAGOGASTRODUODENOSCOPY  12/08/2011   Procedure: ESOPHAGOGASTRODUODENOSCOPY (EGD);  Surgeon: Ladene Artist, MD,FACG;  Location: [facility] ENDOSCOPY;  Service: Endoscopy;  Laterality: N/A;  . UPPER GASTROINTESTINAL ENDOSCOPY  02/22/2008   w/biopsy, duedenal ulcer, antrum erosions    Family History  Problem Relation Age of Onset  . Coronary artery disease Father   . Hypertension Father   . Stroke Father   . Cancer Mother        ?  . Diabetes Maternal Uncle   . Stroke Sister   . Colon cancer Neg Hx     Social History   Socioeconomic History  . Marital status:  Widowed    Spouse name: Not on file  . Number of children: 4  . Years of education: Not on file  . Highest education level: Not on file  Social Needs  . Financial resource strain: Not on file  . Food insecurity - worry: Not on file  . Food insecurity - inability: Not on file  . Transportation needs - medical: Not on file  . Transportation needs - non-medical: Not on file  Occupational History  . Occupation: retired    Fish farm manager: RETIRED  Tobacco Use  . Smoking status: Former Smoker    Types: Cigarettes    Last attempt to quit: 12/31/2000    Years since quitting: 16.3  . Smokeless tobacco: Never  Used  Substance and Sexual Activity  . Alcohol use: No  . Drug use: No  . Sexual activity: Not on file  Other Topics Concern  . Not on file  Social History Narrative   Widowed - husband passed away in 24-May-2009   lives with daughter with epilepsy - mental retardation   Retired     Former Smoker      Alcohol use-no          Occupation: Retired       son - Japleen Tornow     Outpatient Medications Prior to Visit  Medication Sig Dispense Refill  . Cholecalciferol (VITAMIN D) 1000 UNITS capsule Take 1,000 Units by mouth every evening.     . clopidogrel (PLAVIX) 75 MG tablet TAKE 1 TABLET(75 MG) BY MOUTH DAILY 90 tablet 1  . ferrous sulfate 325 (65 FE) MG tablet Take 325 mg daily with breakfast by mouth.    . Insulin Glargine (LANTUS SOLOSTAR) 100 UNIT/ML Solostar Pen ADMINISTER 35 UNITS UNDER THE SKIN DAILY AT in the morning. INCREASE BY 2 UNITS EVERY 3 DAYS IF ALL FSG>120 15 mL 2  . insulin lispro (HUMALOG KWIKPEN) 100 UNIT/ML KiwkPen Inject 0.05-0.1 mLs (5-10 Units total) into the skin 3 (three) times daily with meals. 15 mL 4  . nitroGLYCERIN (NITROSTAT) 0.4 MG SL tablet Place 0.4 mg under the tongue every 5 (five) minutes as needed for chest pain.    . Omega-3 Fatty Acids (FISH OIL) 1200 MG CAPS Take 1,200 mg by mouth daily.     . ranitidine (ZANTAC) 300 MG tablet Take 1/2 tablet at bedtime 45 tablet 3  . thiamine (VITAMIN B-1) 100 MG tablet Take 1 tablet (100 mg total) by mouth daily. (Patient taking differently: Take 100 mg See admin instructions by mouth. Monday, Wednesday and Friday) 30 tablet 5  . triamterene-hydrochlorothiazide (MAXZIDE-25) 37.5-25 MG tablet Take 0.5 tablets by mouth daily. 15 tablet 3  . amLODipine (NORVASC) 5 MG tablet Take 5 mg by mouth daily.    Marland Kitchen atorvastatin (LIPITOR) 20 MG tablet Take 20 mg by mouth daily.    . citalopram (CELEXA) 20 MG tablet Take 1 tablet (20 mg total) by mouth daily. 90 tablet 1  . isosorbide mononitrate (IMDUR) 60 MG 24 hr tablet TAKE 60  mg TABLET BY MOUTH EVERY DAY in the morning 90 tablet 0   No facility-administered medications prior to visit.     No Known Allergies  Review of Systems  Constitutional: Negative for fever and malaise/fatigue.  HENT: Negative for congestion.   Eyes: Negative for blurred vision.  Respiratory: Negative for shortness of breath.   Cardiovascular: Negative for chest pain, palpitations and leg swelling.  Gastrointestinal: Negative for abdominal pain, blood in stool and nausea.  Genitourinary:  Negative for dysuria and frequency.  Musculoskeletal: Negative for falls.  Skin: Negative for rash.  Neurological: Negative for dizziness, loss of consciousness and headaches.  Endo/Heme/Allergies: Negative for environmental allergies.  Psychiatric/Behavioral: Positive for memory loss. Negative for depression. The patient is not nervous/anxious.        Objective:    Physical Exam  Constitutional: She is oriented to person, place, and time. She appears well-developed and well-nourished. No distress.  HENT:  Head: Normocephalic and atraumatic.  Nose: Nose normal.  Eyes: Right eye exhibits no discharge. Left eye exhibits no discharge.  Neck: Normal range of motion. Neck supple.  Cardiovascular: Normal rate and regular rhythm.  No murmur heard. Pulmonary/Chest: Effort normal and breath sounds normal.  Abdominal: Soft. Bowel sounds are normal. There is no tenderness.  Musculoskeletal: She exhibits no edema.  Neurological: She is alert and oriented to person, place, and time.  Skin: Skin is warm and dry.  Psychiatric: She has a normal mood and affect.  Nursing note and vitals reviewed.   BP 118/60 (BP Location: Left Arm, Patient Position: Sitting, Cuff Size: Normal)   Pulse 62   Temp 97.8 F (36.6 C) (Oral)   Resp 18   Wt 162 lb 3.2 oz (73.6 kg)   SpO2 92%   BMI 26.18 kg/m  Wt Readings from Last 3 Encounters:  04/27/17 162 lb 3.2 oz (73.6 kg)  04/14/17 161 lb 6.4 oz (73.2 kg)  02/01/17  160 lb 11.5 oz (72.9 kg)   BP Readings from Last 3 Encounters:  04/27/17 118/60  04/14/17 (!) 141/73  02/03/17 (!) 169/62     Immunization History  Administered Date(s) Administered  . Influenza Split 12/07/2011  . Influenza Whole 03/28/2007, 01/09/2008, 12/25/2008, 11/27/2009  . Influenza, High Dose Seasonal PF 01/05/2015, 11/26/2015  . Influenza,inj,Quad PF,6+ Mos 12/06/2012, 12/29/2013  . Influenza-Unspecified 01/05/2015, 12/26/2016  . Pneumococcal Polysaccharide-23 01/10/2008, 11/15/2012  . Td 07/04/2007    Health Maintenance  Topic Date Due  . DEXA SCAN  04/17/1998  . PNA vac Low Risk Adult (2 of 2 - PCV13) 11/15/2013  . OPHTHALMOLOGY EXAM  03/18/2016  . URINE MICROALBUMIN  11/25/2016  . TETANUS/TDAP  07/03/2017  . FOOT EXAM  07/06/2017  . HEMOGLOBIN A1C  08/02/2017  . INFLUENZA VACCINE  Completed    Lab Results  Component Value Date   WBC 7.9 02/03/2017   HGB 10.8 (L) 02/03/2017   HCT 32.5 (L) 02/03/2017   PLT 164 02/03/2017   GLUCOSE 239 (H) 04/27/2017   CHOL 167 02/02/2017   TRIG 272 (H) 02/02/2017   HDL 36 (L) 02/02/2017   LDLDIRECT 128.0 01/26/2017   LDLCALC 77 02/02/2017   ALT 13 04/27/2017   AST 14 04/27/2017   NA 139 04/27/2017   K 4.7 04/27/2017   CL 103 04/27/2017   CREATININE 2.05 (H) 04/27/2017   BUN 50 (H) 04/27/2017   CO2 28 04/27/2017   TSH 2.03 01/26/2017   INR 1.10 02/01/2017   HGBA1C 8.4 (H) 02/02/2017   MICROALBUR 97.4 (H) 11/26/2015    Lab Results  Component Value Date   TSH 2.03 01/26/2017   Lab Results  Component Value Date   WBC 7.9 02/03/2017   HGB 10.8 (L) 02/03/2017   HCT 32.5 (L) 02/03/2017   MCV 92.1 02/03/2017   PLT 164 02/03/2017   Lab Results  Component Value Date   NA 139 04/27/2017   K 4.7 04/27/2017   CO2 28 04/27/2017   GLUCOSE 239 (H) 04/27/2017   BUN  50 (H) 04/27/2017   CREATININE 2.05 (H) 04/27/2017   BILITOT 0.9 04/27/2017   ALKPHOS 61 04/27/2017   AST 14 04/27/2017   ALT 13 04/27/2017   PROT  6.7 04/27/2017   ALBUMIN 3.8 04/27/2017   CALCIUM 9.5 04/27/2017   ANIONGAP 7 02/03/2017   GFR 24.52 (L) 04/27/2017   Lab Results  Component Value Date   CHOL 167 02/02/2017   Lab Results  Component Value Date   HDL 36 (L) 02/02/2017   Lab Results  Component Value Date   LDLCALC 77 02/02/2017   Lab Results  Component Value Date   TRIG 272 (H) 02/02/2017   Lab Results  Component Value Date   CHOLHDL 4.6 02/02/2017   Lab Results  Component Value Date   HGBA1C 8.4 (H) 02/02/2017         Assessment & Plan:   Problem List Items Addressed This Visit    Diabetes mellitus type 2, insulin dependent (HCC) (Chronic)    hgba1c acceptable, minimize simple carbs. Increase exercise as tolerated. Continue current meds      Relevant Medications   atorvastatin (LIPITOR) 20 MG tablet   History of CVA (cerebrovascular accident) (Chronic)    No recent episodes.       Hyperlipidemia    Encouraged heart healthy diet, increase exercise, avoid trans fats, consider a krill oil cap daily      Relevant Medications   amLODipine (NORVASC) 5 MG tablet   atorvastatin (LIPITOR) 20 MG tablet   isosorbide mononitrate (IMDUR) 60 MG 24 hr tablet   isosorbide mononitrate (IMDUR) 30 MG 24 hr tablet   Essential hypertension - Primary    Well controlled, no changes to meds. Encouraged heart healthy diet such as the DASH diet and exercise as tolerated.       Relevant Medications   amLODipine (NORVASC) 5 MG tablet   atorvastatin (LIPITOR) 20 MG tablet   isosorbide mononitrate (IMDUR) 60 MG 24 hr tablet   isosorbide mononitrate (IMDUR) 30 MG 24 hr tablet   Other Relevant Orders   Comprehensive metabolic panel (Completed)   Dementia    Doing well with a very involved family      Relevant Medications   citalopram (CELEXA) 20 MG tablet   Debility    Encouraged to consider physical therapy they will let us know if they would like to proceed      SOB (shortness of breath)    They are  asked to consider a pulmonology consult if this continues to worsen.        Other Visit Diagnoses    Depression with anxiety       Relevant Medications   citalopram (CELEXA) 20 MG tablet      I have changed Yitzel F. Westra's amLODipine and atorvastatin. I am also having her start on isosorbide mononitrate. Additionally, I am having her maintain her Vitamin D, Fish Oil, nitroGLYCERIN, thiamine, insulin lispro, ferrous sulfate, ranitidine, Insulin Glargine, triamterene-hydrochlorothiazide, clopidogrel, citalopram, and isosorbide mononitrate.  Meds ordered this encounter  Medications  . amLODipine (NORVASC) 5 MG tablet    Sig: Take 1 tablet (5 mg total) by mouth daily.    Dispense:  90 tablet    Refill:  1  . atorvastatin (LIPITOR) 20 MG tablet    Sig: Take 1 tablet (20 mg total) by mouth daily.    Dispense:  90 tablet    Refill:  1  . citalopram (CELEXA) 20 MG tablet    Sig: Take 1 tablet (20  mg total) by mouth daily.    Dispense:  90 tablet    Refill:  1  . isosorbide mononitrate (IMDUR) 60 MG 24 hr tablet    Sig: TAKE 60 mg TABLET BY MOUTH EVERY DAY in the morning    Dispense:  90 tablet    Refill:  0  . isosorbide mononitrate (IMDUR) 30 MG 24 hr tablet    Sig: Take 1 tablet (30 mg total) by mouth at bedtime.    Dispense:  90 tablet    Refill:  1    CMA served as Education administrator during this visit. History, Physical and Plan performed by medical provider. Documentation and orders reviewed and attested to.  Penni Homans, MD

## 2017-04-27 NOTE — Telephone Encounter (Signed)
Spoke with pharmacy to clarify.

## 2017-04-27 NOTE — Patient Instructions (Addendum)
Consider cardiac rehabilitation with our physical therapists for conditioning Consider pulmonology consult for the SOB if it continues to worsen  Hypertension Hypertension is another name for high blood pressure. High blood pressure forces your heart to work harder to pump blood. This can cause problems over time. There are two numbers in a blood pressure reading. There is a top number (systolic) over a bottom number (diastolic). It is best to have a blood pressure below 120/80. Healthy choices can help lower your blood pressure. You may need medicine to help lower your blood pressure if:  Your blood pressure cannot be lowered with healthy choices.  Your blood pressure is higher than 130/80.  Follow these instructions at home: Eating and drinking  If directed, follow the DASH eating plan. This diet includes: ? Filling half of your plate at each meal with fruits and vegetables. ? Filling one quarter of your plate at each meal with whole grains. Whole grains include whole wheat pasta, brown rice, and whole grain bread. ? Eating or drinking low-fat dairy products, such as skim milk or low-fat yogurt. ? Filling one quarter of your plate at each meal with low-fat (lean) proteins. Low-fat proteins include fish, skinless chicken, eggs, beans, and tofu. ? Avoiding fatty meat, cured and processed meat, or chicken with skin. ? Avoiding premade or processed food.  Eat less than 1,500 mg of salt (sodium) a day.  Limit alcohol use to no more than 1 drink a day for nonpregnant women and 2 drinks a day for men. One drink equals 12 oz of beer, 5 oz of wine, or 1 oz of hard liquor. Lifestyle  Work with your doctor to stay at a healthy weight or to lose weight. Ask your doctor what the best weight is for you.  Get at least 30 minutes of exercise that causes your heart to beat faster (aerobic exercise) most days of the week. This may include walking, swimming, or biking.  Get at least 30 minutes of  exercise that strengthens your muscles (resistance exercise) at least 3 days a week. This may include lifting weights or pilates.  Do not use any products that contain nicotine or tobacco. This includes cigarettes and e-cigarettes. If you need help quitting, ask your doctor.  Check your blood pressure at home as told by your doctor.  Keep all follow-up visits as told by your doctor. This is important. Medicines  Take over-the-counter and prescription medicines only as told by your doctor. Follow directions carefully.  Do not skip doses of blood pressure medicine. The medicine does not work as well if you skip doses. Skipping doses also puts you at risk for problems.  Ask your doctor about side effects or reactions to medicines that you should watch for. Contact a doctor if:  You think you are having a reaction to the medicine you are taking.  You have headaches that keep coming back (recurring).  You feel dizzy.  You have swelling in your ankles.  You have trouble with your vision. Get help right away if:  You get a very bad headache.  You start to feel confused.  You feel weak or numb.  You feel faint.  You get very bad pain in your: ? Chest. ? Belly (abdomen).  You throw up (vomit) more than once.  You have trouble breathing. Summary  Hypertension is another name for high blood pressure.  Making healthy choices can help lower blood pressure. If your blood pressure cannot be controlled with healthy choices,  you may need to take medicine. This information is not intended to replace advice given to you by your health care provider. Make sure you discuss any questions you have with your health care provider. Document Released: 08/19/2007 Document Revised: 01/29/2016 Document Reviewed: 01/29/2016 Elsevier Interactive Patient Education  [employer].

## 2017-04-28 DIAGNOSIS — R0602 Shortness of breath: Secondary | ICD-10-CM | POA: Insufficient documentation

## 2017-04-28 NOTE — Assessment & Plan Note (Signed)
They are asked to consider a pulmonology consult if this continues to worsen.

## 2017-04-28 NOTE — Assessment & Plan Note (Signed)
Encouraged to consider physical therapy they will let us know if they would like to proceed

## 2017-04-28 NOTE — Assessment & Plan Note (Signed)
Encouraged heart healthy diet, increase exercise, avoid trans fats, consider a krill oil cap daily 

## 2017-04-28 NOTE — Assessment & Plan Note (Signed)
hgba1c acceptable, minimize simple carbs. Increase exercise as tolerated. Continue current meds 

## 2017-04-28 NOTE — Assessment & Plan Note (Signed)
No recent episodes

## 2017-04-28 NOTE — Assessment & Plan Note (Signed)
Well controlled, no changes to meds. Encouraged heart healthy diet such as the DASH diet and exercise as tolerated.  °

## 2017-04-28 NOTE — Assessment & Plan Note (Signed)
Doing well with a very involved family

## 2017-05-10 ENCOUNTER — Other Ambulatory Visit: Payer: Self-pay

## 2017-05-10 NOTE — Patient Outreach (Signed)
Telephone outreach to patient to obtain mRS was successfully completed. mRS = 0 

## 2017-05-21 ENCOUNTER — Other Ambulatory Visit: Payer: Self-pay

## 2017-05-21 ENCOUNTER — Encounter: Payer: Self-pay | Admitting: Medical

## 2017-05-21 ENCOUNTER — Ambulatory Visit (HOSPITAL_BASED_OUTPATIENT_CLINIC_OR_DEPARTMENT_OTHER)
Admission: RE | Admit: 2017-05-21 | Discharge: 2017-05-21 | Disposition: A | Discharge status: Home / Self Care | Payer: Medicare Other | Source: Ambulatory Visit | Attending: Medical | Admitting: Medical

## 2017-05-21 ENCOUNTER — Telehealth: Payer: Self-pay | Admitting: Family Medicine

## 2017-05-21 ENCOUNTER — Inpatient Hospital Stay (HOSPITAL_BASED_OUTPATIENT_CLINIC_OR_DEPARTMENT_OTHER)
Admission: EM | Admit: 2017-05-21 | Discharge: 2017-05-27 | DRG: 300 | Disposition: A | Discharge status: Home / Self Care | Payer: Medicare Other | Attending: Internal Medicine | Admitting: Internal Medicine

## 2017-05-21 ENCOUNTER — Encounter (HOSPITAL_BASED_OUTPATIENT_CLINIC_OR_DEPARTMENT_OTHER): Payer: Self-pay | Admitting: *Deleted

## 2017-05-21 ENCOUNTER — Ambulatory Visit (INDEPENDENT_AMBULATORY_CARE_PROVIDER_SITE_OTHER): Payer: Medicare Other | Admitting: Medical

## 2017-05-21 VITALS — BP 141/71 | HR 52 | Temp 98.1°F | Resp 16 | Wt 163.2 lb

## 2017-05-21 DIAGNOSIS — K219 Gastro-esophageal reflux disease without esophagitis: Secondary | ICD-10-CM | POA: Diagnosis not present

## 2017-05-21 DIAGNOSIS — I2510 Atherosclerotic heart disease of native coronary artery without angina pectoris: Secondary | ICD-10-CM | POA: Diagnosis present

## 2017-05-21 DIAGNOSIS — Z79899 Other long term (current) drug therapy: Secondary | ICD-10-CM

## 2017-05-21 DIAGNOSIS — R0600 Dyspnea, unspecified: Secondary | ICD-10-CM

## 2017-05-21 DIAGNOSIS — R42 Dizziness and giddiness: Secondary | ICD-10-CM

## 2017-05-21 DIAGNOSIS — R062 Wheezing: Secondary | ICD-10-CM | POA: Diagnosis not present

## 2017-05-21 DIAGNOSIS — R600 Localized edema: Secondary | ICD-10-CM | POA: Diagnosis not present

## 2017-05-21 DIAGNOSIS — E785 Hyperlipidemia, unspecified: Secondary | ICD-10-CM | POA: Diagnosis present

## 2017-05-21 DIAGNOSIS — I4891 Unspecified atrial fibrillation: Secondary | ICD-10-CM | POA: Diagnosis not present

## 2017-05-21 DIAGNOSIS — F329 Major depressive disorder, single episode, unspecified: Secondary | ICD-10-CM | POA: Diagnosis not present

## 2017-05-21 DIAGNOSIS — I824Z2 Acute embolism and thrombosis of unspecified deep veins of left distal lower extremity: Secondary | ICD-10-CM | POA: Insufficient documentation

## 2017-05-21 DIAGNOSIS — Z794 Long term (current) use of insulin: Secondary | ICD-10-CM | POA: Diagnosis not present

## 2017-05-21 DIAGNOSIS — Z951 Presence of aortocoronary bypass graft: Secondary | ICD-10-CM

## 2017-05-21 DIAGNOSIS — R29818 Other symptoms and signs involving the nervous system: Secondary | ICD-10-CM | POA: Diagnosis not present

## 2017-05-21 DIAGNOSIS — E1122 Type 2 diabetes mellitus with diabetic chronic kidney disease: Secondary | ICD-10-CM | POA: Diagnosis not present

## 2017-05-21 DIAGNOSIS — Z8673 Personal history of transient ischemic attack (TIA), and cerebral infarction without residual deficits: Secondary | ICD-10-CM | POA: Insufficient documentation

## 2017-05-21 DIAGNOSIS — E1142 Type 2 diabetes mellitus with diabetic polyneuropathy: Secondary | ICD-10-CM | POA: Diagnosis present

## 2017-05-21 DIAGNOSIS — I517 Cardiomegaly: Secondary | ICD-10-CM | POA: Insufficient documentation

## 2017-05-21 DIAGNOSIS — J449 Chronic obstructive pulmonary disease, unspecified: Secondary | ICD-10-CM | POA: Diagnosis present

## 2017-05-21 DIAGNOSIS — R2681 Unsteadiness on feet: Secondary | ICD-10-CM | POA: Diagnosis not present

## 2017-05-21 DIAGNOSIS — R05 Cough: Secondary | ICD-10-CM | POA: Diagnosis not present

## 2017-05-21 DIAGNOSIS — Z87891 Personal history of nicotine dependence: Secondary | ICD-10-CM

## 2017-05-21 DIAGNOSIS — I361 Nonrheumatic tricuspid (valve) insufficiency: Secondary | ICD-10-CM | POA: Diagnosis not present

## 2017-05-21 DIAGNOSIS — M7122 Synovial cyst of popliteal space [Baker], left knee: Secondary | ICD-10-CM | POA: Insufficient documentation

## 2017-05-21 DIAGNOSIS — Z7902 Long term (current) use of antithrombotics/antiplatelets: Secondary | ICD-10-CM | POA: Diagnosis not present

## 2017-05-21 DIAGNOSIS — I82492 Acute embolism and thrombosis of other specified deep vein of left lower extremity: Secondary | ICD-10-CM

## 2017-05-21 DIAGNOSIS — D509 Iron deficiency anemia, unspecified: Secondary | ICD-10-CM | POA: Diagnosis not present

## 2017-05-21 DIAGNOSIS — R5383 Other fatigue: Secondary | ICD-10-CM | POA: Diagnosis not present

## 2017-05-21 DIAGNOSIS — I129 Hypertensive chronic kidney disease with stage 1 through stage 4 chronic kidney disease, or unspecified chronic kidney disease: Secondary | ICD-10-CM | POA: Diagnosis present

## 2017-05-21 DIAGNOSIS — M79609 Pain in unspecified limb: Secondary | ICD-10-CM | POA: Diagnosis not present

## 2017-05-21 DIAGNOSIS — R0602 Shortness of breath: Secondary | ICD-10-CM | POA: Diagnosis present

## 2017-05-21 DIAGNOSIS — E119 Type 2 diabetes mellitus without complications: Secondary | ICD-10-CM

## 2017-05-21 DIAGNOSIS — Z823 Family history of stroke: Secondary | ICD-10-CM | POA: Diagnosis not present

## 2017-05-21 DIAGNOSIS — I10 Essential (primary) hypertension: Secondary | ICD-10-CM | POA: Diagnosis not present

## 2017-05-21 DIAGNOSIS — I82409 Acute embolism and thrombosis of unspecified deep veins of unspecified lower extremity: Secondary | ICD-10-CM | POA: Diagnosis present

## 2017-05-21 DIAGNOSIS — Z90710 Acquired absence of both cervix and uterus: Secondary | ICD-10-CM | POA: Diagnosis not present

## 2017-05-21 DIAGNOSIS — E1151 Type 2 diabetes mellitus with diabetic peripheral angiopathy without gangrene: Secondary | ICD-10-CM | POA: Diagnosis present

## 2017-05-21 DIAGNOSIS — N184 Chronic kidney disease, stage 4 (severe): Secondary | ICD-10-CM | POA: Diagnosis present

## 2017-05-21 DIAGNOSIS — F0390 Unspecified dementia without behavioral disturbance: Secondary | ICD-10-CM | POA: Diagnosis present

## 2017-05-21 DIAGNOSIS — E11319 Type 2 diabetes mellitus with unspecified diabetic retinopathy without macular edema: Secondary | ICD-10-CM | POA: Diagnosis present

## 2017-05-21 DIAGNOSIS — R270 Ataxia, unspecified: Secondary | ICD-10-CM | POA: Diagnosis not present

## 2017-05-21 DIAGNOSIS — I82402 Acute embolism and thrombosis of unspecified deep veins of left lower extremity: Secondary | ICD-10-CM | POA: Diagnosis not present

## 2017-05-21 LAB — CBC WITH DIFFERENTIAL/PLATELET
Basophils Absolute: 0.1 10*3/uL (ref 0.0–0.1)
Basophils Relative: 0.7 % (ref 0.0–3.0)
EOS PCT: 2.5 % (ref 0.0–5.0)
Eosinophils Absolute: 0.2 10*3/uL (ref 0.0–0.7)
HCT: 37.3 % (ref 36.0–46.0)
Hemoglobin: 12.6 g/dL (ref 12.0–15.0)
Lymphocytes Relative: 27.5 % (ref 12.0–46.0)
Lymphs Abs: 2.2 10*3/uL (ref 0.7–4.0)
MCHC: 33.9 g/dL (ref 30.0–36.0)
MCV: 92.4 fl (ref 78.0–100.0)
MONO ABS: 0.6 10*3/uL (ref 0.1–1.0)
Monocytes Relative: 8 % (ref 3.0–12.0)
Neutro Abs: 5 10*3/uL (ref 1.4–7.7)
Neutrophils Relative %: 61.3 % (ref 43.0–77.0)
Platelets: 198 10*3/uL (ref 150.0–400.0)
RBC: 4.03 Mil/uL (ref 3.87–5.11)
RDW: 13.5 % (ref 11.5–15.5)
WBC: 8.2 10*3/uL (ref 4.0–10.5)

## 2017-05-21 LAB — COMPREHENSIVE METABOLIC PANEL
ALT: 15 U/L (ref 0–35)
AST: 13 U/L (ref 0–37)
Albumin: 3.8 g/dL (ref 3.5–5.2)
Alkaline Phosphatase: 72 U/L (ref 39–117)
BUN: 38 mg/dL — AB (ref 6–23)
CHLORIDE: 102 meq/L (ref 96–112)
CO2: 27 meq/L (ref 19–32)
Calcium: 9.7 mg/dL (ref 8.4–10.5)
Creatinine, Ser: 1.81 mg/dL — ABNORMAL HIGH (ref 0.40–1.20)
GFR: 28.3 mL/min — ABNORMAL LOW (ref >60.00)
GLUCOSE: 270 mg/dL — AB (ref 70–99)
POTASSIUM: 4 meq/L (ref 3.5–5.1)
SODIUM: 137 meq/L (ref 135–145)
Total Bilirubin: 0.9 mg/dL (ref 0.2–1.2)
Total Protein: 6.8 g/dL (ref 6.0–8.3)

## 2017-05-21 LAB — POCT URINALYSIS DIPSTICK
APPEARANCE: NEGATIVE
Bilirubin, UA: NEGATIVE
Blood, UA: NEGATIVE
Glucose, UA: NEGATIVE
Ketones, UA: NEGATIVE
LEUKOCYTES UA: NEGATIVE
NITRITE UA: NEGATIVE
ODOR: NEGATIVE
SPEC GRAV UA: 1.025 (ref 1.010–1.025)
Urobilinogen, UA: NEGATIVE E.U./dL — AB
pH, UA: 6 (ref 5.0–8.0)

## 2017-05-21 LAB — BRAIN NATRIURETIC PEPTIDE: PRO B NATRI PEPTIDE: 210 pg/mL — AB (ref 0.0–100.0)

## 2017-05-21 MED ORDER — ALBUTEROL SULFATE HFA 108 (90 BASE) MCG/ACT IN AERS: 2.0000 | INHALATION_SPRAY | Freq: Four times a day (QID) | RESPIRATORY_TRACT | 2 refills | Status: DC | PRN

## 2017-05-21 MED ORDER — CLOPIDOGREL BISULFATE 75 MG PO TABS: 75.0000 mg | ORAL_TABLET | Freq: Every day | ORAL | Status: DC

## 2017-05-21 MED ORDER — TRIAMTERENE-HCTZ 37.5-25 MG PO TABS
0.5000 | ORAL_TABLET | Freq: Every day | ORAL | Status: DC
  Administered 2017-05-22 – 2017-05-27 (×6): 0.5 via ORAL
  Filled 2017-05-21: qty 1
  Filled 2017-05-21: qty 0.5
  Filled 2017-05-21 (×5): qty 1

## 2017-05-21 MED ORDER — HEPARIN (PORCINE) IN NACL 100-0.45 UNIT/ML-% IJ SOLN
1150.0000 [IU]/h | INTRAMUSCULAR | Status: DC
  Administered 2017-05-21: 1150 [IU]/h via INTRAVENOUS
  Filled 2017-05-21: qty 250

## 2017-05-21 MED ORDER — IPRATROPIUM BROMIDE HFA 17 MCG/ACT IN AERS: 2.0000 | INHALATION_SPRAY | Freq: Four times a day (QID) | RESPIRATORY_TRACT | 12 refills | Status: AC | PRN

## 2017-05-21 MED ORDER — ATORVASTATIN CALCIUM 10 MG PO TABS
20.0000 mg | ORAL_TABLET | Freq: Every day | ORAL | Status: DC
  Administered 2017-05-22 – 2017-05-27 (×6): 20 mg via ORAL
  Filled 2017-05-21 (×3): qty 2
  Filled 2017-05-21: qty 1
  Filled 2017-05-21 (×3): qty 2

## 2017-05-21 MED ORDER — CITALOPRAM HYDROBROMIDE 10 MG PO TABS
20.0000 mg | ORAL_TABLET | Freq: Every day | ORAL | Status: DC
  Administered 2017-05-22 – 2017-05-27 (×6): 20 mg via ORAL
  Filled 2017-05-21 (×2): qty 2
  Filled 2017-05-21: qty 1
  Filled 2017-05-21 (×5): qty 2

## 2017-05-21 MED ORDER — AMLODIPINE BESYLATE 5 MG PO TABS
5.0000 mg | ORAL_TABLET | Freq: Every day | ORAL | Status: DC
  Administered 2017-05-22 – 2017-05-27 (×6): 5 mg via ORAL
  Filled 2017-05-21 (×6): qty 1

## 2017-05-21 MED FILL — ATROVENT HFA INHALER: 17 | 25 days supply | Qty: 13 | Fill #0

## 2017-05-21 NOTE — ED Provider Notes (Addendum)
[facility] EMERGENCY DEPARTMENT Provider Note  CSN: [id number] Arrival date & time: 05/21/17 1843  Chief Complaint(s) Weakness  HPI Destiny [name] is a 84 y.o. female with an extensive past medical history listed below including hypertension, hyperlipidemia, diabetes, CAD status post CABG on Plavix, prior CVA, chronic kidney disease who presents to the emergency department with 2 days of feeling unsteady on her feet.  This is alleviated by sitting still.  Worse with ambulation.  No other alleviating or aggravating factors.  Additionally patient reports increased dyspnea on exertion.  No prior history of DVTs or blood clots.  No associated chest pain, nausea, vomiting.  No abdominal pain or diarrhea.  No recent fevers or infections.  No urinary symptoms.  Patient was seen by her PCP earlier today who obtain a CT scan which did not show any acute process.  Also had a chest x-ray that reveals stable cardiomegaly without evidence of pneumonia, pulmonary edema, pleural effusions.  During the exam patient was noted to have right lower extremity edema greater than left but had posterior calf tenderness.  DVT ultrasound was obtained revealing saphenous vein clot with possible DVT.  Given this there was a concern for possible pulmonary embolism and patient was sent here to the ED.  They are also concerned that the patient had another stroke.  HPI  Past Medical History Past Medical History:  Diagnosis Date  . Acute renal insufficiency 12/24/2013  . Amaurosis fugax   . Arthritis 12/06/2012  . Atherosclerosis   . CAD (coronary artery disease)    a. s/p CABG x 4 (LIMA->LAD, VG->Diag, VG->OM1->OM3);  b. 2010 Cath: 3/4 patent grafts (VG->diag occluded), 3vd, sev PVD ->Med Rx;  c. 12/2011 Lexi MV: sm area of mild mid and apical ant wall ischemia ->med rx.  . Carotid bruit   . Chronic kidney disease (CKD) stage G4/A1, severely decreased glomerular filtration rate (GFR) between 15-29 mL/min/1.73  square meter and albuminuria creatinine ratio less than 30 mg/g (HCC)    GFR 25 on 12/25/13  . CVA (cerebral infarction)   . Dementia    pt denies  . Depression 12/24/2013  . Diabetes mellitus type II    insulin dependent.   . Diabetic peripheral neuropathy ([place])   . Diabetic retinopathy   . Diverticulosis   . Duodenal ulcer 2009 and 11/2011   caused GI bleeding  . Gallstones 2009   seen on CT scan  . GI bleed 04/2011, 11/2011   found non-bleeding duodenal ulcers  . Hyperlipidemia   . Hypersomnolence 03/12/2013  . Hypertension   . Iron deficiency anemia secondary to blood loss (chronic)   . Melena 1015/2015   PEPTIC ULCER    REQUIRING TRANSFUSION  . Pedal edema 10/12/2016  . PVD (peripheral vascular disease) ([place])   . Rib fracture 04/21/2015  . Thiamine deficiency 08/13/2015  . UTI (urinary tract infection) 04/08/2014  . Weight loss, non-intentional 01/29/2014   Patient Active Problem List   Diagnosis Date Noted  . SOB (shortness of breath) 04/28/2017  . Stroke (cerebrum) ([place]) 02/01/2017  . Sinusitis 01/31/2017  . Pedal edema 10/12/2016  . Thiamine deficiency 08/13/2015  . Rib fracture 04/21/2015  . UTI (urinary tract infection) 04/08/2014  . Duodenal ulcer with hemorrhage 12/29/2013  . CKD (chronic kidney disease) stage 4, GFR 15-29 ml/min (HCC) 12/28/2013  . GIB (gastrointestinal bleeding) 12/28/2013  . History of CVA (cerebrovascular accident) 12/28/2013  . Chronic diastolic heart failure ([place]) 12/28/2013  . Overweight (BMI 25.0-29.9) 12/28/2013  .  Depression 12/24/2013  . Hypersomnolence 03/12/2013  . Arthritis 12/06/2012  . Unstable angina ([place]) 06/07/2012  . CAD (coronary artery disease) of artery bypass graft   . Hx of CABG 06/05/2012  . Debility 06/05/2012  . Anemia 05/27/2011  . Diabetes mellitus type 2, insulin dependent ([place]) 12/22/2010  . Dementia 12/22/2010  . B12 deficiency 12/22/2010  . Type 2 diabetes mellitus with diabetic neuropathy, unspecified  ([employer]) 08/28/2008  . MURMUR 07/04/2008  . AMAUROSIS FUGAX 08/12/2007  . Hyperlipidemia 06/22/2007  . Essential hypertension 06/22/2007  . Peripheral vascular disease ([place]) 06/21/2007  . CAROTID BRUIT 06/21/2007   Home Medication(s) Prior to Admission medications   Medication Sig Start Date End Date Taking? Authorizing Provider  albuterol (PROVENTIL HFA;VENTOLIN HFA) 108 (90 Base) MCG/ACT inhaler Inhale 2 puffs into the lungs every 6 (six) hours as needed for wheezing or shortness of breath. 05/21/17   Saguier, Percell Miller, PA-C  amLODipine (NORVASC) 5 MG tablet Take 1 tablet (5 mg total) by mouth daily. 04/27/17   Mosie Lukes, MD  atorvastatin (LIPITOR) 20 MG tablet Take 1 tablet (20 mg total) by mouth daily. 04/27/17   Mosie Lukes, MD  Cholecalciferol (VITAMIN D) 1000 UNITS capsule Take 1,000 Units by mouth every evening.     [provider]  citalopram (CELEXA) 20 MG tablet Take 1 tablet (20 mg total) by mouth daily. 04/27/17   Mosie Lukes, MD  clopidogrel (PLAVIX) 75 MG tablet TAKE 1 TABLET(75 MG) BY MOUTH DAILY 04/05/17   Mosie Lukes, MD  ferrous sulfate 325 (65 FE) MG tablet Take 325 mg daily with breakfast by mouth.    [provider]  Insulin Glargine (LANTUS SOLOSTAR) 100 UNIT/ML Solostar Pen ADMINISTER 35 UNITS UNDER THE SKIN DAILY AT in the morning. INCREASE BY 2 UNITS EVERY 3 DAYS IF ALL FSG>120 03/22/17   Mosie Lukes, MD  insulin lispro (HUMALOG KWIKPEN) 100 UNIT/ML KiwkPen Inject 0.05-0.1 mLs (5-10 Units total) into the skin 3 (three) times daily with meals. 03/02/16   Mosie Lukes, MD  ipratropium (ATROVENT HFA) 17 MCG/ACT inhaler Inhale 2 puffs into the lungs every 6 (six) hours as needed for wheezing. 05/21/17   Saguier, Percell Miller, PA-C  isosorbide mononitrate (IMDUR) 30 MG 24 hr tablet Take 1 tablet (30 mg total) by mouth at bedtime. 04/27/17   Mosie Lukes, MD  isosorbide mononitrate (IMDUR) 60 MG 24 hr tablet TAKE 60 mg TABLET BY MOUTH EVERY DAY in the  morning 04/27/17   Mosie Lukes, MD  nitroGLYCERIN (NITROSTAT) 0.4 MG SL tablet Place 0.4 mg under the tongue every 5 (five) minutes as needed for chest pain.    [provider]  Omega-3 Fatty Acids (FISH OIL) 1200 MG CAPS Take 1,200 mg by mouth daily.     [provider]  ranitidine (ZANTAC) 300 MG tablet Take 1/2 tablet at bedtime 02/16/17   Renato Shin, MD  thiamine (VITAMIN B-1) 100 MG tablet Take 1 tablet (100 mg total) by mouth daily. Patient taking differently: Take 100 mg See admin instructions by mouth. Monday, Wednesday and Friday 02/24/16   Mosie Lukes, MD  triamterene-hydrochlorothiazide (MAXZIDE-25) 37.5-25 MG tablet Take 0.5 tablets by mouth daily. 03/22/17   Mosie Lukes, MD  Past Surgical History Past Surgical History:  Procedure Laterality Date  . ABDOMINAL HYSTERECTOMY    . COLONOSCOPY  05/20/2011   Procedure: COLONOSCOPY;  Surgeon: Scarlette Shorts, MD;  Location: [facility];  Service: Endoscopy;  Laterality: N/A;  . CORONARY ARTERY BYPASS GRAFT  1999   x 4  . ENTEROSCOPY N/A 12/29/2013   Procedure: ENTEROSCOPY;  Surgeon: Inda Castle, MD;  Location: [facility];  Service: Endoscopy;  Laterality: N/A;  . ESOPHAGOGASTRODUODENOSCOPY  05/16/2011   Procedure: ESOPHAGOGASTRODUODENOSCOPY (EGD);  Surgeon: Gatha Mayer, MD;  Location: [facility] ENDOSCOPY;  Service: Endoscopy;  Laterality: N/A;  . ESOPHAGOGASTRODUODENOSCOPY  12/08/2011   Procedure: ESOPHAGOGASTRODUODENOSCOPY (EGD);  Surgeon: Ladene Artist, MD,FACG;  Location: [facility] ENDOSCOPY;  Service: Endoscopy;  Laterality: N/A;  . UPPER GASTROINTESTINAL ENDOSCOPY  02/22/2008   w/biopsy, duedenal ulcer, antrum erosions   Family History Family History  Problem Relation Age of Onset  . Coronary artery disease Father   . Hypertension Father   . Stroke Father   . Cancer Mother        ?   . Diabetes Maternal Uncle   . Stroke Sister   . Colon cancer Neg Hx     Social History Social History   Tobacco Use  . Smoking status: Former Smoker    Types: Cigarettes    Last attempt to quit: 12/31/2000    Years since quitting: 16.3  . Smokeless tobacco: Never Used  Substance Use Topics  . Alcohol use: No  . Drug use: No   Allergies Patient has no known allergies.  Review of Systems Review of Systems All other systems are reviewed and are negative for acute change except as noted in the HPI  Physical Exam Vital Signs  I have reviewed the triage vital signs BP (!) 160/75 (BP Location: Right Arm)   Pulse 66   Temp 97.8 F (36.6 C) (Oral)   Resp 17   SpO2 98%   Physical Exam  Constitutional: She is oriented to person, place, and time. She appears well-developed and well-nourished. No distress.  HENT:  Head: Normocephalic and atraumatic.  Nose: Nose normal.  Eyes: Conjunctivae and EOM are normal. Pupils are equal, round, and reactive to light. Right eye exhibits no discharge. Left eye exhibits no discharge. No scleral icterus.  Neck: Normal range of motion. Neck supple.  Cardiovascular: Normal rate and regular rhythm. Exam reveals no gallop and no friction rub.  No murmur heard. Pulmonary/Chest: Effort normal and breath sounds normal. No stridor. No respiratory distress. She has no rales.  Abdominal: Soft. She exhibits no distension. There is no tenderness.  Musculoskeletal: She exhibits no edema or tenderness.  Neurological: She is alert and oriented to person, place, and time.  Mental Status:  Alert and oriented to person, place, and time.  Attention and concentration normal.  Speech clear.  Recent memory is intact  Cranial Nerves:  II Visual Fields: Intact to confrontation. Visual fields intact. III, IV, VI: Pupils equal and reactive to light and near. Full eye movement without nystagmus  V Facial Sensation: Normal. No weakness of masticatory muscles    VII: No facial weakness or asymmetry  VIII Auditory Acuity: Grossly normal  IX/X: The uvula is midline; the palate elevates symmetrically  XI: Normal sternocleidomastoid and trapezius strength  XII: The tongue is midline. No atrophy or fasciculations.   Motor System: Muscle Strength: 5/5 and symmetric in the upper and lower extremities. No pronation or drift.  Muscle Tone: Tone and muscle bulk are normal  in the upper and lower extremities.   Reflexes: DTRs: 1+ and symmetrical in all four extremities. No Clonus Coordination: Intact finger-to-nose, heel-to-shin. No tremor.  Sensation: Intact to light touch, and pinprick.   Gait: Ataxic   HINTS: Nystagmus: none Head impulse: negative Skew: Abnormal test of skew on the right   Skin: Skin is warm and dry. No rash noted. She is not diaphoretic. No erythema.  Psychiatric: She has a normal mood and affect.  Vitals reviewed.   ED Results and Treatments Labs (all labs ordered are listed, but only abnormal results are displayed) Labs Reviewed - No data to display                                                                                                                       EKG  EKG Interpretation  Date/Time:    Ventricular Rate:    PR Interval:    QRS Duration:   QT Interval:    QTC Calculation:   R Axis:     Text Interpretation:        Radiology Dg Chest 2 View  Result Date: 05/21/2017 CLINICAL DATA:  Cough and intermittent wheezing. EXAM: CHEST - 2 VIEW COMPARISON:  02/01/2017 FINDINGS: Chronic cardiomegaly. There is aortic tortuosity that is stable. Status post CABG. The lungs are clear. Mild hyperinflation. No acute osseous finding. IMPRESSION: 1. No evidence of active disease. 2. Chronic cardiomegaly. Electronically Signed   By: Monte Fantasia M.D.   On: 05/21/2017 17:57   Ct Head Wo Contrast  Result Date: 05/21/2017 CLINICAL DATA:  84 year old female with increased dizziness. EXAM: CT HEAD WITHOUT CONTRAST  TECHNIQUE: Contiguous axial images were obtained from the base of the skull through the vertex without intravenous contrast. COMPARISON:  02/02/2017 CT and prior studies FINDINGS: Brain: No evidence of acute infarction, hemorrhage, hydrocephalus, extra-axial collection or mass lesion/mass effect. Atrophy, chronic small-vessel white matter ischemic changes again noted. Remote bilateral basal ganglia, periventricular white matter and RIGHT parietal/occipital infarcts again noted. Vascular: Atherosclerotic calcifications again noted. Skull: Normal. Negative for fracture or focal lesion. Sinuses/Orbits: No acute abnormality Other: None IMPRESSION: 1. No evidence of acute intracranial abnormality 2. Atrophy, chronic small-vessel white matter ischemic changes and remote infarcts as described. Electronically Signed   By: Margarette Canada M.D.   On: 05/21/2017 18:01   US Venous Img Lower Unilateral Left  Result Date: 05/21/2017 CLINICAL DATA:  Left knee weakness and swelling for 2 days. EXAM: LEFT LOWER EXTREMITY VENOUS DOPPLER ULTRASOUND TECHNIQUE: Gray-scale sonography with graded compression, as well as color Doppler and duplex ultrasound were performed to evaluate the lower extremity deep venous systems from the level of the common femoral vein and including the common femoral, femoral, profunda femoral, popliteal and calf veins including the posterior tibial, peroneal and gastrocnemius veins when visible. The superficial great saphenous vein was also interrogated. Spectral Doppler was utilized to evaluate flow at rest and with distal augmentation maneuvers in the common femoral, femoral and popliteal veins. COMPARISON:  None.  FINDINGS: Contralateral Common Femoral Vein: Respiratory phasicity is normal and symmetric with the symptomatic side. No evidence of thrombus. Normal compressibility. Common Femoral Vein: No evidence of thrombus. Normal compressibility, respiratory phasicity and response to augmentation.  Saphenofemoral Junction: No evidence of thrombus. Normal compressibility and flow on color Doppler imaging. Profunda Femoral Vein: No evidence of thrombus. Normal compressibility and flow on color Doppler imaging. Femoral Vein: No evidence of thrombus. Normal compressibility, respiratory phasicity and response to augmentation. Popliteal Vein: No evidence of thrombus. Normal compressibility, respiratory phasicity and response to augmentation. Calf Veins: There are multiple noncompressible gastrocnemius veins. Some of these veins have occlusive thrombus and others have nonocclusive thrombus. Visualized posterior tibial veins are compressible without thrombus. Superficial Great Saphenous Vein: No evidence of thrombus. Normal compressibility. Other Findings: Small fluid collection in the popliteal fossa measures 3.5 x 0.7 x 1.6 cm. Findings are compatible with a small Baker's cyst. IMPRESSION: Positive for DVT in the left calf. Evidence for thrombus in left gastrocnemius veins. Small left knee Baker's cyst. Electronically Signed   By: Markus Daft M.D.   On: 05/21/2017 18:03   Pertinent labs & imaging results that were available during my care of the patient were reviewed by me and considered in my medical decision making (see chart for details).  Medications Ordered in ED Medications  heparin ADULT infusion 100 units/mL (25000 units/229mL sodium chloride 0.45%) (1,150 Units/hr Intravenous Transfusing/Transfer 05/21/17 2335)  amLODipine (NORVASC) tablet 5 mg (not administered)  atorvastatin (LIPITOR) tablet 20 mg (not administered)  citalopram (CELEXA) tablet 20 mg (not administered)  clopidogrel (PLAVIX) tablet 75 mg (not administered)  triamterene-hydrochlorothiazide (MAXZIDE-25) 37.5-25 MG per tablet 0.5 tablet (not administered)                                                                                                                                    Procedures Procedures CRITICAL CARE Performed  by: Grayce Sessions Norris Bodley Total critical care time: 30 minutes Critical care time was exclusive of separately billable procedures and treating other patients. Critical care was necessary to treat or prevent imminent or life-threatening deterioration. Critical care was time spent personally by me on the following activities: development of treatment plan with patient and/or surrogate as well as nursing, discussions with consultants, evaluation of patient's response to treatment, examination of patient, obtaining history from patient or surrogate, ordering and performing treatments and interventions, ordering and review of laboratory studies, ordering and review of radiographic studies, pulse oximetry and re-evaluation of patient's condition.   (including critical care time)  Medical Decision Making / ED Course I have reviewed the nursing notes for this encounter and the patient's prior records (if available in EHR or on provided paperwork).    1.  Ataxia Patient with known history of CVA.  CT head was unremarkable.  Patient obtain screening labs by PCP without significant electrolyte derangements.  Presentation is concerning for possible new stroke given her ataxia  and abnormal test of skew on hints exam.  Patient is out of the window for TPA.  Patient will require admission for stroke workup.  Will discuss with hospitalist.  2.  Shortness of breath Ultrasound with possible DVT.  Concerning for possible PE.  Patient's GFR does not allow for CTA.  During admission patient can obtain a VQ scan to assess for possible PEs.  Patient started on Hep gtt for DVT, possible PE.  Final Clinical Impression(s) / ED Diagnoses Final diagnoses:  Ataxia  Shortness of breath  Acute deep vein thrombosis (DVT) of other specified vein of left lower extremity ([place])      This chart was dictated using voice recognition software.  Despite best efforts to proofread,  errors can occur which can change the  documentation meaning.      Fatima Blank, MD 05/21/17 [phone]

## 2017-05-21 NOTE — Progress Notes (Signed)
ANTICOAGULATION CONSULT NOTE - Initial Consult  Pharmacy Consult for heparin Indication: DVT and stroke  No Known Allergies  Patient Measurements: Height: 5\' 6"  (167.6 cm) Weight: 163 lb 2.3 oz (74 kg) IBW/kg (Calculated) : 59.3 Heparin Dosing Weight: 74kg  Vital Signs: Temp: 97.8 F (36.6 C) (03/08 1910) Temp Source: Oral (03/08 1910) BP: 160/75 (03/08 1910) Pulse Rate: 66 (03/08 1910)  Labs: Recent Labs    05/21/17 1430  HGB 12.6  HCT 37.3  PLT 198.0  CREATININE 1.81*    Estimated Creatinine Clearance: 23.8 mL/min (A) (by C-G formula based on SCr of 1.81 mg/dL (H)).  Assessment: 89 YOF brought [facility] with weakness/ataxia. Was seen by PCP earlier today and ultrasound revealed saphenous vein clot, also concern patient may have a PE along with possible new stroke due to profound weakness/ataxia that is new. She is not on anticoagulation PTA, no bleeding noted at baseline. CBC is stable.  Goal of Therapy:  Heparin level 0.3-0.5 units/ml due to possible stroke Monitor platelets by anticoagulation protocol: Yes   Plan:  Start heparin at 1150 units/hr- NO BOLUS given possibility of new stroke First heparin level with AM labs Daily heparin level and CBC Follow neuro workup and confirmation of DVT/PE  Theora Vankirk D. Tita Terhaar, PharmD, [place] Clinical Pharmacist [phone] 05/21/2017 9:04 PM

## 2017-05-21 NOTE — ED Triage Notes (Signed)
Pt. Was seen by PCP today and told poss. And had OP [place] was told blood clot in leg and sent to ED  Pt. Has noted edema in the R leg and is weak on her feet.  NO pain and no SHOB noted in Pt.

## 2017-05-21 NOTE — Plan of Care (Signed)
84 yo F with ataxia and SOB.  H/o CVA and US shows DVT in leg today.  1) Ataxia: needs MRI probably, CT head no acute process.  2) SOB: needs VQ scan (has CKD with creat 1.8 (baseline today)), but really just sounds like she needs echo for possible stroke or what not since with DVT she probably needs anticoagulation anyhow.  Will have them start heparin gtt for now for the DVT (may or may not also have PE).  Putting in for tele, obs for moment, convert to IP if MRI shows stroke.

## 2017-05-21 NOTE — Telephone Encounter (Signed)
Dr. Caryl Bis,  Thanks for your help/handling the call.  Mackie Pai, PA-C

## 2017-05-21 NOTE — ED Notes (Signed)
ED Provider at bedside. 

## 2017-05-21 NOTE — Patient Instructions (Signed)
For your recent fatigue, episodes of dizziness history of TIA history of smoking and recent pedal edema, I am ordering CBC, CMP, BNP, left lower extremity ultrasound, chest x-ray and CT of head.  Based on neurologic exam and recent history I do not think you need emergency department evaluation presently.  But it would be best to do the above now.   Please be careful when you stand up to ambulate.  Gain your balance before walking.  Would encourage you that you use a walker.  If you do have obvious wheezing over the weekend, I do think it is best to use albuterol and Atrovent.  This might be needed in light of your smoking history.  Talked with daughter today and patient is going to stay with family tonight.  We will watch her closely.  If she has worsening or changing signs and symptoms then recommend ED evaluation.  For follow-up in 5-7 days or as needed. We will contact you later with stat lab results.  Please give [place] an update on how you did over the weekend.

## 2017-05-21 NOTE — Telephone Encounter (Signed)
Spoke with radiology department. Patient was found to have a left lower extremity DVT. Had a CT head with no acute changes. CXR with no acute changes. I reviewed todays office note and noted the patient has been more short of breath recently. I discussed this with the patient daughter and advised of the results. I discussed that her dyspnea could be related to one of her chronic medical conditions, though with the DVT my concern would be for a PE and I suggested that they have her evaluated in the ED for possible CTA chest vs VQ scan to evaluate for PE and to discuss initiation of anticoagulation. The patients daughter voiced understanding and advised they would take her over to the ED at [facility] where they currently were for imaging. FYI to ordering physician.

## 2017-05-21 NOTE — Progress Notes (Signed)
Subjective:    Patient ID: [name], female    DOB: [birth year], 84 y.o.   MRN: [id number]  HPI  Pt in both feet are more swollen. Left leg is always swollen but rt leg swollen since yesterday. Pt echo showed EF in range of 55%-60%. Had Echo done in October. Some wheezing intermittently yesterday. With a little exercise and moving she get wheezy/sob per caretaker. November 2018 xray showed mild cardiac enlargement.  Pt was smoker for years in past. Pt never used any inhalers. Smoked when 84 yo. Stopped 15 years ago.  Also she has been more dizzy than usual. Pt yesterday from 9 am-12 pm could not get up at all. Hx of TIA in November. On further discussion she could stand but then looked liked would fall. Could not walk without assistance. She did walk in office today. Pt as a walker but she never uses. She did have a fall in 2017.   Chronic infarctions within the right paramedian parietal and occipital lobes. Stable chronic lacunar infarct involving left lentiform nucleus extending into mid corona radiata as well as caudate body. Stable chronic lacunar infarct within right putamen. Stable chronic microvascular ischemic changes and parenchymal volume loss of the brain.  Pt had some dizziness maybe this morning. Then she states she does not notice her dizzy episodes.  Pt does not feel weak right now.  Pt stays by herself over the weekend.     Review of Systems  Constitutional: Negative for appetite change, chills and fatigue.  HENT: Negative for congestion, ear pain, facial swelling and postnasal drip.   Respiratory: Positive for shortness of breath and wheezing. Negative for cough and chest tightness.   Cardiovascular: Negative for chest pain and palpitations.  Gastrointestinal: Negative for abdominal distention and anal bleeding.  Musculoskeletal: Negative for back pain, myalgias and neck pain.       Leg pain. Some edema. See hpi.  Skin: Negative for rash.  Neurological:  Positive for dizziness. Negative for syncope, speech difficulty and light-headedness.       Yesterday morning and today had brief episode of dizziness.   Hematological: Negative for adenopathy. Does not bruise/bleed easily.  Psychiatric/Behavioral: Negative for behavioral problems, confusion, decreased concentration, sleep disturbance and suicidal ideas. The patient is not nervous/anxious.    Past Medical History:  Diagnosis Date  . Acute renal insufficiency 12/24/2013  . Amaurosis fugax   . Arthritis 12/06/2012  . Atherosclerosis   . CAD (coronary artery disease)    a. s/p CABG x 4 (LIMA->LAD, VG->Diag, VG->OM1->OM3);  b. 2010 Cath: 3/4 patent grafts (VG->diag occluded), 3vd, sev PVD ->Med Rx;  c. 12/2011 Lexi MV: sm area of mild mid and apical ant wall ischemia ->med rx.  . Carotid bruit   . Chronic kidney disease (CKD) stage G4/A1, severely decreased glomerular filtration rate (GFR) between 15-29 mL/min/1.73 square meter and albuminuria creatinine ratio less than 30 mg/g (HCC)    GFR 25 on 12/25/13  . CVA (cerebral infarction)   . Dementia    pt denies  . Depression 12/24/2013  . Diabetes mellitus type II    insulin dependent.   . Diabetic peripheral neuropathy ([place])   . Diabetic retinopathy   . Diverticulosis   . Duodenal ulcer 2009 and 11/2011   caused GI bleeding  . Gallstones 2009   seen on CT scan  . GI bleed 04/2011, 11/2011   found non-bleeding duodenal ulcers  . Hyperlipidemia   . Hypersomnolence 03/12/2013  . Hypertension   .  Iron deficiency anemia secondary to blood loss (chronic)   . Melena 1015/2015   PEPTIC ULCER    REQUIRING TRANSFUSION  . Pedal edema 10/12/2016  . PVD (peripheral vascular disease) ([place])   . Rib fracture 04/21/2015  . Thiamine deficiency 08/13/2015  . UTI (urinary tract infection) 04/08/2014  . Weight loss, non-intentional 01/29/2014     Social History   Socioeconomic History  . Marital status: Widowed    Spouse name: Not on file  . Number of  children: 4  . Years of education: Not on file  . Highest education level: Not on file  Social Needs  . Financial resource strain: Not on file  . Food insecurity - worry: Not on file  . Food insecurity - inability: Not on file  . Transportation needs - medical: Not on file  . Transportation needs - non-medical: Not on file  Occupational History  . Occupation: retired    Fish farm manager: RETIRED  Tobacco Use  . Smoking status: Former Smoker    Types: Cigarettes    Last attempt to quit: 12/31/2000    Years since quitting: 16.3  . Smokeless tobacco: Never Used  Substance and Sexual Activity  . Alcohol use: No  . Drug use: No  . Sexual activity: Not on file  Other Topics Concern  . Not on file  Social History Narrative   Widowed - husband passed away in 02-Jun-2009   lives with daughter with epilepsy - mental retardation   Retired     Former Smoker      Alcohol use-no          Occupation: Retired       son - Burdette Gergely     Past Surgical History:  Procedure Laterality Date  . ABDOMINAL HYSTERECTOMY    . COLONOSCOPY  05/20/2011   Procedure: COLONOSCOPY;  Surgeon: Scarlette Shorts, MD;  Location: [facility];  Service: Endoscopy;  Laterality: N/A;  . CORONARY ARTERY BYPASS GRAFT  1999   x 4  . ENTEROSCOPY N/A 12/29/2013   Procedure: ENTEROSCOPY;  Surgeon: Inda Castle, MD;  Location: [facility];  Service: Endoscopy;  Laterality: N/A;  . ESOPHAGOGASTRODUODENOSCOPY  05/16/2011   Procedure: ESOPHAGOGASTRODUODENOSCOPY (EGD);  Surgeon: Gatha Mayer, MD;  Location[facility] ENDOSCOPY;  Service: Endoscopy;  Laterality: N/A;  . ESOPHAGOGASTRODUODENOSCOPY  12/08/2011   Procedure: ESOPHAGOGASTRODUODENOSCOPY (EGD);  Surgeon: Ladene Artist, MD,FACG;  Location: [facility] ENDOSCOPY;  Service: Endoscopy;  Laterality: N/A;  . UPPER GASTROINTESTINAL ENDOSCOPY  02/22/2008   w/biopsy, duedenal ulcer, antrum erosions    Family History  Problem Relation Age of Onset  . Coronary artery disease Father   .  Hypertension Father   . Stroke Father   . Cancer Mother        ?  . Diabetes Maternal Uncle   . Stroke Sister   . Colon cancer Neg Hx     No Known Allergies  Current Outpatient Medications on File Prior to Visit  Medication Sig Dispense Refill  . amLODipine (NORVASC) 5 MG tablet Take 1 tablet (5 mg total) by mouth daily. 90 tablet 1  . atorvastatin (LIPITOR) 20 MG tablet Take 1 tablet (20 mg total) by mouth daily. 90 tablet 1  . Cholecalciferol (VITAMIN D) 1000 UNITS capsule Take 1,000 Units by mouth every evening.     . citalopram (CELEXA) 20 MG tablet Take 1 tablet (20 mg total) by mouth daily. 90 tablet 1  . clopidogrel (PLAVIX) 75 MG tablet TAKE 1 TABLET(75 MG) BY  MOUTH DAILY 90 tablet 1  . ferrous sulfate 325 (65 FE) MG tablet Take 325 mg daily with breakfast by mouth.    . Insulin Glargine (LANTUS SOLOSTAR) 100 UNIT/ML Solostar Pen ADMINISTER 35 UNITS UNDER THE SKIN DAILY AT in the morning. INCREASE BY 2 UNITS EVERY 3 DAYS IF ALL FSG>120 15 mL 2  . insulin lispro (HUMALOG KWIKPEN) 100 UNIT/ML KiwkPen Inject 0.05-0.1 mLs (5-10 Units total) into the skin 3 (three) times daily with meals. 15 mL 4  . isosorbide mononitrate (IMDUR) 30 MG 24 hr tablet Take 1 tablet (30 mg total) by mouth at bedtime. 90 tablet 1  . isosorbide mononitrate (IMDUR) 60 MG 24 hr tablet TAKE 60 mg TABLET BY MOUTH EVERY DAY in the morning 90 tablet 0  . nitroGLYCERIN (NITROSTAT) 0.4 MG SL tablet Place 0.4 mg under the tongue every 5 (five) minutes as needed for chest pain.    . Omega-3 Fatty Acids (FISH OIL) 1200 MG CAPS Take 1,200 mg by mouth daily.     . ranitidine (ZANTAC) 300 MG tablet Take 1/2 tablet at bedtime 45 tablet 3  . thiamine (VITAMIN B-1) 100 MG tablet Take 1 tablet (100 mg total) by mouth daily. (Patient taking differently: Take 100 mg See admin instructions by mouth. Monday, Wednesday and Friday) 30 tablet 5  . triamterene-hydrochlorothiazide (MAXZIDE-25) 37.5-25 MG tablet Take 0.5 tablets by  mouth daily. 15 tablet 3   No current facility-administered medications on file prior to visit.     BP (!) 141/71   Pulse (!) 52   Temp 98.1 F (36.7 C) (Oral)   Resp 16   Wt 163 lb 3.2 oz (74 kg)   SpO2 96% Comment: rechecked and kept monitor on finger.  BMI 26.34 kg/m       Objective:   Physical Exam   General  Mental Status - Alert. General Appearance - Well groomed. Not in acute distress.  Skin Rashes- No Rashes.  HEENT Head- Normal. Ear Auditory Canal - Left- Normal. Right - Normal.Tympanic Membrane- Left- Normal. Right- Normal. Eye Sclera/Conjunctiva- Left- Normal. Right- Normal. Nose & Sinuses Nasal Mucosa- Left-  Boggy and Congested. Right-  Boggy and  Congested.Bilateral maxillary and frontal sinus pressure. Mouth & Throat Lips: Upper Lip- Normal: no dryness, cracking, pallor, cyanosis, or vesicular eruption. Lower Lip-Normal: no dryness, cracking, pallor, cyanosis or vesicular eruption. Buccal Mucosa- Bilateral- No Aphthous ulcers. Oropharynx- No Discharge or Erythema. Tonsils: Characteristics- Bilateral- No Erythema or Congestion. Size/Enlargement- Bilateral- No enlargement. Discharge- bilateral-None.  Neck Neck- Supple. No Masses.   Chest and Lung Exam Auscultation: Breath Sounds:-Clear even and unlabored.  Cardiovascular Auscultation:Rythm- Regular, rate and rhythm. Murmurs & Other Heart Sounds:Ausculatation of the heart reveal- No Murmurs.  Lymphatic Head & Neck General Head & Neck Lymphatics: Bilateral: Description- No Localized lymphadenopathy.  General Mental Status- Alert. General Appearance- Not in acute distress.   Skin General: Color- Normal Color. Moisture- Normal Moisture.  Neck Carotid Arteries- Normal color. Moisture- Normal Moisture. No carotid bruits. No JVD.  Chest and Lung Exam Auscultation: Breath Sounds:-Normal.  Cardiovascular Auscultation:Rythm- Regular. Murmurs & Other Heart Sounds:Auscultation of the heart  reveals- No Murmurs.  Abdomen Inspection:-Inspeection Normal. Palpation/Percussion:Note:No mass. Palpation and Percussion of the abdomen reveal- Non Tender, Non Distended + BS, no rebound or guarding.    Neurologic Cranial Nerve exam:- CN III-XII intact(No nystagmus), symmetric smile. Finger to Nose:- Normal/Intact Strength:- 5/5 equal and symmetric strength both upper and lower extremities.       Assessment & Plan:  For your recent fatigue, episodes of dizziness history of TIA history of smoking and recent pedal edema, I am ordering CBC, CMP, BNP, left lower extremity ultrasound, chest x-ray and CT of head.  Based on neurologic exam and recent history I do not think you need emergency department evaluation presently.  But it would be best to do the above now.   Please be careful when you stand up to ambulate.  Gain your balance before walking.  Would encourage you that you use a walker.  If you do have obvious wheezing over the weekend, I do think it is best to use albuterol and Atrovent.  This might be needed in light of your smoking history.  Talked with daughter today and patient is going to stay with family tonight.  We will watch her closely.  If she has worsening or changing signs and symptoms then recommend ED evaluation.  For follow-up in 5-7 days or as needed. We will contact you later with stat lab results.  Please give [place] an update on how you did over the weekend.  Sent pt down today to radiology. Wanted test done today stat. Eventually they did get done stat as I requested. Studies came back later and doctor on call notified. Pt went to ED. While in office was initially difficult to schedule pt. My CMA was coordinating with radiologist receptionist staff earlier in day.  40 minutes spent with patient today reviewing various complaints, explaining plan/counseling with pt on work up differential diagnosis and treatment plan. Called pt daughter as well. 50% of time spent  counseling with pt, caretaker and with daughter.  Mackie Pai, PA-C

## 2017-05-22 ENCOUNTER — Observation Stay (HOSPITAL_BASED_OUTPATIENT_CLINIC_OR_DEPARTMENT_OTHER): Payer: Medicare Other

## 2017-05-22 ENCOUNTER — Encounter (HOSPITAL_COMMUNITY): Payer: Self-pay | Admitting: *Deleted

## 2017-05-22 ENCOUNTER — Observation Stay (HOSPITAL_COMMUNITY): Payer: Medicare Other

## 2017-05-22 ENCOUNTER — Other Ambulatory Visit: Payer: Self-pay

## 2017-05-22 DIAGNOSIS — E119 Type 2 diabetes mellitus without complications: Secondary | ICD-10-CM | POA: Diagnosis not present

## 2017-05-22 DIAGNOSIS — R270 Ataxia, unspecified: Secondary | ICD-10-CM | POA: Diagnosis not present

## 2017-05-22 DIAGNOSIS — I824Z2 Acute embolism and thrombosis of unspecified deep veins of left distal lower extremity: Principal | ICD-10-CM

## 2017-05-22 DIAGNOSIS — N184 Chronic kidney disease, stage 4 (severe): Secondary | ICD-10-CM | POA: Diagnosis not present

## 2017-05-22 DIAGNOSIS — I361 Nonrheumatic tricuspid (valve) insufficiency: Secondary | ICD-10-CM

## 2017-05-22 DIAGNOSIS — I82492 Acute embolism and thrombosis of other specified deep vein of left lower extremity: Secondary | ICD-10-CM

## 2017-05-22 DIAGNOSIS — I10 Essential (primary) hypertension: Secondary | ICD-10-CM | POA: Diagnosis not present

## 2017-05-22 DIAGNOSIS — I4891 Unspecified atrial fibrillation: Secondary | ICD-10-CM

## 2017-05-22 DIAGNOSIS — Z794 Long term (current) use of insulin: Secondary | ICD-10-CM

## 2017-05-22 DIAGNOSIS — R0602 Shortness of breath: Secondary | ICD-10-CM | POA: Diagnosis not present

## 2017-05-22 DIAGNOSIS — I82409 Acute embolism and thrombosis of unspecified deep veins of unspecified lower extremity: Secondary | ICD-10-CM | POA: Diagnosis present

## 2017-05-22 DIAGNOSIS — Z8673 Personal history of transient ischemic attack (TIA), and cerebral infarction without residual deficits: Secondary | ICD-10-CM

## 2017-05-22 LAB — URINE CULTURE
MICRO NUMBER: 90300050
Result:: NO GROWTH
SPECIMEN QUALITY:: ADEQUATE

## 2017-05-22 LAB — CBC
HEMATOCRIT: 34.3 % — AB (ref 36.0–46.0)
HEMOGLOBIN: 11.8 g/dL — AB (ref 12.0–15.0)
MCH: 31.6 pg (ref 26.0–34.0)
MCHC: 34.4 g/dL (ref 30.0–36.0)
MCV: 91.7 fL (ref 78.0–100.0)
Platelets: 163 10*3/uL (ref 150–400)
RBC: 3.74 MIL/uL — AB (ref 3.87–5.11)
RDW: 13.1 % (ref 11.5–15.5)
WBC: 7.6 10*3/uL (ref 4.0–10.5)

## 2017-05-22 LAB — TROPONIN I
Troponin I: <0.03 ng/mL (ref <0.03)
Troponin I: <0.03 ng/mL (ref <0.03)
Troponin I: <0.03 ng/mL (ref <0.03)

## 2017-05-22 LAB — HEPARIN LEVEL (UNFRACTIONATED)
HEPARIN UNFRACTIONATED: 1.08 [IU]/mL — AB (ref 0.30–0.70)
Heparin Unfractionated: 0.69 IU/mL (ref 0.30–0.70)

## 2017-05-22 LAB — BASIC METABOLIC PANEL
ANION GAP: 12 (ref 5–15)
BUN: 45 mg/dL — AB (ref 6–20)
CO2: 22 mmol/L (ref 22–32)
Calcium: 9.4 mg/dL (ref 8.9–10.3)
Chloride: 101 mmol/L (ref 101–111)
Creatinine, Ser: 2.26 mg/dL — ABNORMAL HIGH (ref 0.44–1.00)
GFR calc Af Amer: 22 mL/min — ABNORMAL LOW (ref >60)
GFR, EST NON AFRICAN AMERICAN: 19 mL/min — AB (ref >60)
GLUCOSE: 198 mg/dL — AB (ref 65–99)
Potassium: 4.1 mmol/L (ref 3.5–5.1)
Sodium: 135 mmol/L (ref 135–145)

## 2017-05-22 LAB — ECHOCARDIOGRAM COMPLETE
Height: 66 in
WEIGHTICAEL: 2627.88 [oz_av]

## 2017-05-22 LAB — GLUCOSE, CAPILLARY
GLUCOSE-CAPILLARY: 288 mg/dL — AB (ref 65–99)
GLUCOSE-CAPILLARY: 226 mg/dL — AB (ref 65–99)
Glucose-Capillary: 174 mg/dL — ABNORMAL HIGH (ref 65–99)

## 2017-05-22 LAB — PROTIME-INR
INR: 1.14
Prothrombin Time: 14.5 seconds (ref 11.4–15.2)

## 2017-05-22 LAB — VITAMIN B12: VITAMIN B 12: 312 pg/mL (ref 180–914)

## 2017-05-22 MED ORDER — ACETAMINOPHEN 325 MG PO TABS: 650.0000 mg | ORAL_TABLET | Freq: Four times a day (QID) | ORAL | Status: DC | PRN

## 2017-05-22 MED ORDER — VITAMIN B-1 100 MG PO TABS
100.0000 mg | ORAL_TABLET | ORAL | Status: DC
  Administered 2017-05-24 – 2017-05-26 (×3): 100 mg via ORAL
  Filled 2017-05-22 (×3): qty 1

## 2017-05-22 MED ORDER — ISOSORBIDE MONONITRATE ER 30 MG PO TB24
30.0000 mg | ORAL_TABLET | Freq: Every day | ORAL | Status: DC
  Administered 2017-05-23 – 2017-05-26 (×4): 30 mg via ORAL
  Filled 2017-05-22 (×4): qty 1

## 2017-05-22 MED ORDER — PANTOPRAZOLE SODIUM 40 MG PO TBEC: 40.0000 mg | DELAYED_RELEASE_TABLET | Freq: Two times a day (BID) | ORAL | Status: DC

## 2017-05-22 MED ORDER — ACETAMINOPHEN 650 MG RE SUPP: 650.0000 mg | Freq: Four times a day (QID) | RECTAL | Status: DC | PRN

## 2017-05-22 MED ORDER — WARFARIN - PHARMACIST DOSING INPATIENT
Freq: Every day | Status: DC
  Administered 2017-05-23 – 2017-05-25 (×3)

## 2017-05-22 MED ORDER — INSULIN GLARGINE 100 UNIT/ML ~~LOC~~ SOLN
25.0000 [IU] | Freq: Every day | SUBCUTANEOUS | Status: DC
  Administered 2017-05-22 – 2017-05-26 (×5): 25 [IU] via SUBCUTANEOUS
  Filled 2017-05-22 (×6): qty 0.25

## 2017-05-22 MED ORDER — HEPARIN (PORCINE) IN NACL 100-0.45 UNIT/ML-% IJ SOLN
800.0000 [IU]/h | INTRAMUSCULAR | Status: DC
  Administered 2017-05-22: 950 [IU]/h via INTRAVENOUS
  Administered 2017-05-22 – 2017-05-24 (×2): 850 [IU]/h via INTRAVENOUS
  Administered 2017-05-25: 700 [IU]/h via INTRAVENOUS
  Administered 2017-05-27: 800 [IU]/h via INTRAVENOUS
  Filled 2017-05-22 (×4): qty 250

## 2017-05-22 MED ORDER — PANTOPRAZOLE SODIUM 40 MG PO TBEC
40.0000 mg | DELAYED_RELEASE_TABLET | Freq: Two times a day (BID) | ORAL | Status: DC
  Administered 2017-05-22 – 2017-05-27 (×12): 40 mg via ORAL
  Filled 2017-05-22 (×12): qty 1

## 2017-05-22 MED ORDER — FERROUS SULFATE 325 (65 FE) MG PO TABS
325.0000 mg | ORAL_TABLET | Freq: Every day | ORAL | Status: DC
  Administered 2017-05-22 – 2017-05-27 (×6): 325 mg via ORAL
  Filled 2017-05-22 (×6): qty 1

## 2017-05-22 MED ORDER — ONDANSETRON HCL 4 MG/2ML IJ SOLN: 4.0000 mg | Freq: Four times a day (QID) | INTRAMUSCULAR | Status: DC | PRN

## 2017-05-22 MED ORDER — INSULIN ASPART 100 UNIT/ML ~~LOC~~ SOLN
0.0000 [IU] | Freq: Three times a day (TID) | SUBCUTANEOUS | Status: DC
  Administered 2017-05-22: 2 [IU] via SUBCUTANEOUS
  Administered 2017-05-22: 5 [IU] via SUBCUTANEOUS
  Administered 2017-05-22: 3 [IU] via SUBCUTANEOUS
  Administered 2017-05-23: 2 [IU] via SUBCUTANEOUS
  Administered 2017-05-23: 3 [IU] via SUBCUTANEOUS
  Administered 2017-05-23: 5 [IU] via SUBCUTANEOUS
  Administered 2017-05-24: 2 [IU] via SUBCUTANEOUS
  Administered 2017-05-24: 3 [IU] via SUBCUTANEOUS
  Administered 2017-05-24 – 2017-05-25 (×2): 2 [IU] via SUBCUTANEOUS
  Administered 2017-05-25: 5 [IU] via SUBCUTANEOUS
  Administered 2017-05-25 (×2): 1 [IU] via SUBCUTANEOUS
  Administered 2017-05-26: 2 [IU] via SUBCUTANEOUS
  Administered 2017-05-26: 1 [IU] via SUBCUTANEOUS
  Administered 2017-05-26 – 2017-05-27 (×3): 2 [IU] via SUBCUTANEOUS

## 2017-05-22 MED ORDER — ISOSORBIDE MONONITRATE ER 60 MG PO TB24
60.0000 mg | ORAL_TABLET | Freq: Every day | ORAL | Status: DC
  Administered 2017-05-22 – 2017-05-27 (×6): 60 mg via ORAL
  Filled 2017-05-22 (×6): qty 1

## 2017-05-22 MED ORDER — WARFARIN SODIUM 4 MG PO TABS
4.0000 mg | ORAL_TABLET | Freq: Once | ORAL | Status: AC
  Administered 2017-05-22: 4 mg via ORAL
  Filled 2017-05-22: qty 1

## 2017-05-22 MED ORDER — VITAMIN D 1000 UNITS PO TABS
1000.0000 [IU] | ORAL_TABLET | Freq: Every evening | ORAL | Status: DC
  Administered 2017-05-22 – 2017-05-26 (×5): 1000 [IU] via ORAL
  Filled 2017-05-22 (×5): qty 1

## 2017-05-22 MED ORDER — INSULIN ASPART 100 UNIT/ML ~~LOC~~ SOLN
3.0000 [IU] | Freq: Three times a day (TID) | SUBCUTANEOUS | Status: DC
  Administered 2017-05-22 – 2017-05-27 (×17): 3 [IU] via SUBCUTANEOUS

## 2017-05-22 MED ORDER — ALBUTEROL SULFATE (2.5 MG/3ML) 0.083% IN NEBU: 3.0000 mL | INHALATION_SOLUTION | Freq: Four times a day (QID) | RESPIRATORY_TRACT | Status: DC | PRN

## 2017-05-22 MED ORDER — FAMOTIDINE 20 MG PO TABS: 20.0000 mg | ORAL_TABLET | Freq: Every day | ORAL | Status: DC

## 2017-05-22 MED ORDER — ONDANSETRON HCL 4 MG PO TABS: 4.0000 mg | ORAL_TABLET | Freq: Four times a day (QID) | ORAL | Status: DC | PRN

## 2017-05-22 MED ORDER — IPRATROPIUM BROMIDE 0.02 % IN SOLN: 0.5000 mg | Freq: Four times a day (QID) | RESPIRATORY_TRACT | Status: DC | PRN

## 2017-05-22 NOTE — H&P (Addendum)
History and Physical    Destiny [name] KGU:[id number] DOB: [birth year] DOA: 05/21/2017  PCP: Mosie Lukes, MD  Patient coming from: Home  I have personally briefly reviewed patient's old medical records in [place]  Chief Complaint: DVT, SOB, Ataxia  HPI: Destiny [name] is a 84 y.o. female with medical history significant of CAD s/p CABG, CVA in Nov.  Duodenal ulcer, last bleeding in 2014, DM2 on insulin, HTN.  Patient went in to PCPs office today with c/o leg swelling, dizziness, and SOB.  Left leg is always swollen (patient reports L leg swollen and achy for months now) but rt leg swollen since yesterday.  Pt echo showed EF in range of 55%-60%. Had Echo done in October. Some wheezing intermittently yesterday. With a little exercise and moving she get wheezy/sob per caretaker.  She is also more dizzy than usual.  Pt yesterday from 9 am-12 pm could not get up at all. Hx of TIA in November. On further discussion she could stand but then looked liked would fall. Could not walk without assistance.  Also had dizziness this morning.  Dizziness has been ongoing for past 2 days it seems.   ED Course: Patient found to have LEFT calf DVT on [place] today (not clear on chronicity of DVT given months of symptoms).  CT head negative but patient is noted to be ataxic on exam.  CTA chest not preformed due to baseline CKD stage 4.  Started on heparin gtt.   Review of Systems: As per HPI otherwise 10 point review of systems negative.   Past Medical History:  Diagnosis Date  . Acute renal insufficiency 12/24/2013  . Amaurosis fugax   . Arthritis 12/06/2012  . Atherosclerosis   . CAD (coronary artery disease)    a. s/p CABG x 4 (LIMA->LAD, VG->Diag, VG->OM1->OM3);  b. 2010 Cath: 3/4 patent grafts (VG->diag occluded), 3vd, sev PVD ->Med Rx;  c. 12/2011 Lexi MV: sm area of mild mid and apical ant wall ischemia ->med rx.  . Carotid bruit   . Chronic kidney disease (CKD) stage G4/A1, severely  decreased glomerular filtration rate (GFR) between 15-29 mL/min/1.73 square meter and albuminuria creatinine ratio less than 30 mg/g (HCC)    GFR 25 on 12/25/13  . CVA (cerebral infarction)   . Dementia    pt denies  . Depression 12/24/2013  . Diabetes mellitus type II    insulin dependent.   . Diabetic peripheral neuropathy ([place])   . Diabetic retinopathy   . Diverticulosis   . Duodenal ulcer 2009 and 11/2011   caused GI bleeding  . Gallstones 2009   seen on CT scan  . GI bleed 04/2011, 11/2011   found non-bleeding duodenal ulcers  . Hyperlipidemia   . Hypersomnolence 03/12/2013  . Hypertension   . Iron deficiency anemia secondary to blood loss (chronic)   . Melena 1015/2015   PEPTIC ULCER    REQUIRING TRANSFUSION  . Pedal edema 10/12/2016  . PVD (peripheral vascular disease) ([place])   . Rib fracture 04/21/2015  . Thiamine deficiency 08/13/2015  . UTI (urinary tract infection) 04/08/2014  . Weight loss, non-intentional 01/29/2014    Past Surgical History:  Procedure Laterality Date  . ABDOMINAL HYSTERECTOMY    . COLONOSCOPY  05/20/2011   Procedure: COLONOSCOPY;  Surgeon: Scarlette Shorts, MD;  Location: [place];  Service: Endoscopy;  Laterality: N/A;  . CORONARY ARTERY BYPASS GRAFT  1999   x 4  . ENTEROSCOPY N/A 12/29/2013   Procedure: ENTEROSCOPY;  Surgeon: Inda Castle, MD;  Location: [place];  Service: Endoscopy;  Laterality: N/A;  . ESOPHAGOGASTRODUODENOSCOPY  05/16/2011   Procedure: ESOPHAGOGASTRODUODENOSCOPY (EGD);  Surgeon: Gatha Mayer, MD;  Location: [facility] ENDOSCOPY;  Service: Endoscopy;  Laterality: N/A;  . ESOPHAGOGASTRODUODENOSCOPY  12/08/2011   Procedure: ESOPHAGOGASTRODUODENOSCOPY (EGD);  Surgeon: Ladene Artist, MD,FACG;  Location: [facility] ENDOSCOPY;  Service: Endoscopy;  Laterality: N/A;  . UPPER GASTROINTESTINAL ENDOSCOPY  02/22/2008   w/biopsy, duedenal ulcer, antrum erosions     reports that she quit smoking about 16 years ago. Her smoking use included cigarettes.  she has never used smokeless tobacco. She reports that she does not drink alcohol or use drugs.  No Known Allergies  Family History  Problem Relation Age of Onset  . Coronary artery disease Father   . Hypertension Father   . Stroke Father   . Cancer Mother        ?  . Diabetes Maternal Uncle   . Stroke Sister   . Colon cancer Neg Hx      Prior to Admission medications   Medication Sig Start Date End Date Taking? Authorizing Provider  albuterol (PROVENTIL HFA;VENTOLIN HFA) 108 (90 Base) MCG/ACT inhaler Inhale 2 puffs into the lungs every 6 (six) hours as needed for wheezing or shortness of breath. 05/21/17   Saguier, Percell Miller, PA-C  amLODipine (NORVASC) 5 MG tablet Take 1 tablet (5 mg total) by mouth daily. 04/27/17   Mosie Lukes, MD  atorvastatin (LIPITOR) 20 MG tablet Take 1 tablet (20 mg total) by mouth daily. 04/27/17   Mosie Lukes, MD  Cholecalciferol (VITAMIN D) 1000 UNITS capsule Take 1,000 Units by mouth every evening.     [provider]  citalopram (CELEXA) 20 MG tablet Take 1 tablet (20 mg total) by mouth daily. 04/27/17   Mosie Lukes, MD  clopidogrel (PLAVIX) 75 MG tablet TAKE 1 TABLET(75 MG) BY MOUTH DAILY 04/05/17   Mosie Lukes, MD  ferrous sulfate 325 (65 FE) MG tablet Take 325 mg daily with breakfast by mouth.    [provider]  Insulin Glargine (LANTUS SOLOSTAR) 100 UNIT/ML Solostar Pen ADMINISTER 35 UNITS UNDER THE SKIN DAILY AT in the morning. INCREASE BY 2 UNITS EVERY 3 DAYS IF ALL FSG>120 03/22/17   Mosie Lukes, MD  insulin lispro (HUMALOG KWIKPEN) 100 UNIT/ML KiwkPen Inject 0.05-0.1 mLs (5-10 Units total) into the skin 3 (three) times daily with meals. 03/02/16   Mosie Lukes, MD  ipratropium (ATROVENT HFA) 17 MCG/ACT inhaler Inhale 2 puffs into the lungs every 6 (six) hours as needed for wheezing. 05/21/17   Saguier, Percell Miller, PA-C  isosorbide mononitrate (IMDUR) 30 MG 24 hr tablet Take 1 tablet (30 mg total) by mouth at bedtime. 04/27/17    Mosie Lukes, MD  isosorbide mononitrate (IMDUR) 60 MG 24 hr tablet TAKE 60 mg TABLET BY MOUTH EVERY DAY in the morning 04/27/17   Mosie Lukes, MD  nitroGLYCERIN (NITROSTAT) 0.4 MG SL tablet Place 0.4 mg under the tongue every 5 (five) minutes as needed for chest pain.    [provider]  Omega-3 Fatty Acids (FISH OIL) 1200 MG CAPS Take 1,200 mg by mouth daily.     [provider]  ranitidine (ZANTAC) 300 MG tablet Take 1/2 tablet at bedtime 02/16/17   Renato Shin, MD  thiamine (VITAMIN B-1) 100 MG tablet Take 1 tablet (100 mg total) by mouth daily. Patient taking differently: Take 100 mg See admin instructions by  mouth. Monday, Wednesday and Friday 02/24/16   Mosie Lukes, MD  triamterene-hydrochlorothiazide (MAXZIDE-25) 37.5-25 MG tablet Take 0.5 tablets by mouth daily. 03/22/17   Mosie Lukes, MD    Physical Exam: Vitals:   05/21/17 2207 05/21/17 2210 05/21/17 2215 05/22/17 0020  BP: (!) 159/68 (!) 170/76 (!) 170/74 (!) 171/73  Pulse: 64 64 65 65  Resp:  17 20 18   Temp:    97.8 F (36.6 C)  TempSrc:    Oral  SpO2: 97% 97% 97% 97%  Weight:    74.5 kg (164 lb 3.9 oz)  Height:    5\' 6"  (1.676 m)    Constitutional: NAD, calm, comfortable Eyes: PERRL, lids and conjunctivae normal ENMT: Mucous membranes are moist. Posterior pharynx clear of any exudate or lesions.Normal dentition.  Neck: normal, supple, no masses, no thyromegaly Respiratory: clear to auscultation bilaterally, no wheezing, no crackles. Normal respiratory effort. No accessory muscle use.  Cardiovascular: Regular rate and rhythm, no murmurs / rubs / gallops. No extremity edema. 2+ pedal pulses. No carotid bruits.  Abdomen: no tenderness, no masses palpated. No hepatosplenomegaly. Bowel sounds positive.  Musculoskeletal: no clubbing / cyanosis. No joint deformity upper and lower extremities. Good ROM, no contractures. Normal muscle tone.  Skin: no rashes, lesions, ulcers. No  induration Neurologic: Ataxia Psychiatric: Normal judgment and insight. Alert and oriented x 3. Normal mood.    Labs on Admission: I have personally reviewed following labs and imaging studies  CBC: Recent Labs  Lab 05/21/17 1430  WBC 8.2  NEUTROABS 5.0  HGB 12.6  HCT 37.3  MCV 92.4  PLT 696.7   Basic Metabolic Panel: Recent Labs  Lab 05/21/17 1430  NA 137  K 4.0  CL 102  CO2 27  GLUCOSE 270*  BUN 38*  CREATININE 1.81*  CALCIUM 9.7   GFR: Estimated Creatinine Clearance: 23.9 mL/min (A) (by C-G formula based on SCr of 1.81 mg/dL (H)). Liver Function Tests: Recent Labs  Lab 05/21/17 1430  AST 13  ALT 15  ALKPHOS 72  BILITOT 0.9  PROT 6.8  ALBUMIN 3.8   No results for input(s): LIPASE, AMYLASE in the last 168 hours. No results for input(s): AMMONIA in the last 168 hours. Coagulation Profile: No results for input(s): INR, PROTIME in the last 168 hours. Cardiac Enzymes: No results for input(s): CKTOTAL, CKMB, CKMBINDEX, TROPONINI in the last 168 hours. BNP (last 3 results) Recent Labs    05/21/17 1430  PROBNP 210.0*   HbA1C: No results for input(s): HGBA1C in the last 72 hours. CBG: No results for input(s): GLUCAP in the last 168 hours. Lipid Profile: No results for input(s): CHOL, HDL, LDLCALC, TRIG, CHOLHDL, LDLDIRECT in the last 72 hours. Thyroid Function Tests: No results for input(s): TSH, T4TOTAL, FREET4, T3FREE, THYROIDAB in the last 72 hours. Anemia Panel: No results for input(s): VITAMINB12, FOLATE, FERRITIN, TIBC, IRON, RETICCTPCT in the last 72 hours. Urine analysis:    Component Value Date/Time   COLORURINE YELLOW 02/02/2017 1939   APPEARANCEUR CLOUDY (A) 02/02/2017 1939   LABSPEC 1.013 02/02/2017 1939   PHURINE 5.0 02/02/2017 1939   GLUCOSEU NEGATIVE 02/02/2017 1939   GLUCOSEU 100 (A) 01/26/2017 1114   HGBUR SMALL (A) 02/02/2017 1939   HGBUR large 04/26/2009 1110   BILIRUBINUR Negative 05/21/2017 1512   KETONESUR NEGATIVE  02/02/2017 1939   PROTEINUR 1+ 05/21/2017 1512   PROTEINUR 100 (A) 02/02/2017 1939   UROBILINOGEN negative (A) 05/21/2017 1512   UROBILINOGEN 0.2 01/26/2017 1114   NITRITE  Negative 05/21/2017 1512   NITRITE NEGATIVE 02/02/2017 1939   LEUKOCYTESUR Negative 05/21/2017 1512    Radiological Exams on Admission: Dg Chest 2 View  Result Date: 05/21/2017 CLINICAL DATA:  Cough and intermittent wheezing. EXAM: CHEST - 2 VIEW COMPARISON:  02/01/2017 FINDINGS: Chronic cardiomegaly. There is aortic tortuosity that is stable. Status post CABG. The lungs are clear. Mild hyperinflation. No acute osseous finding. IMPRESSION: 1. No evidence of active disease. 2. Chronic cardiomegaly. Electronically Signed   By: Monte Fantasia M.D.   On: 05/21/2017 17:57   Ct Head Wo Contrast  Result Date: 05/21/2017 CLINICAL DATA:  84 year old female with increased dizziness. EXAM: CT HEAD WITHOUT CONTRAST TECHNIQUE: Contiguous axial images were obtained from the base of the skull through the vertex without intravenous contrast. COMPARISON:  02/02/2017 CT and prior studies FINDINGS: Brain: No evidence of acute infarction, hemorrhage, hydrocephalus, extra-axial collection or mass lesion/mass effect. Atrophy, chronic small-vessel white matter ischemic changes again noted. Remote bilateral basal ganglia, periventricular white matter and RIGHT parietal/occipital infarcts again noted. Vascular: Atherosclerotic calcifications again noted. Skull: Normal. Negative for fracture or focal lesion. Sinuses/Orbits: No acute abnormality Other: None IMPRESSION: 1. No evidence of acute intracranial abnormality 2. Atrophy, chronic small-vessel white matter ischemic changes and remote infarcts as described. Electronically Signed   By: Margarette Canada M.D.   On: 05/21/2017 18:01   US Venous Img Lower Unilateral Left  Result Date: 05/21/2017 CLINICAL DATA:  Left knee weakness and swelling for 2 days. EXAM: LEFT LOWER EXTREMITY VENOUS DOPPLER ULTRASOUND  TECHNIQUE: Gray-scale sonography with graded compression, as well as color Doppler and duplex ultrasound were performed to evaluate the lower extremity deep venous systems from the level of the common femoral vein and including the common femoral, femoral, profunda femoral, popliteal and calf veins including the posterior tibial, peroneal and gastrocnemius veins when visible. The superficial great saphenous vein was also interrogated. Spectral Doppler was utilized to evaluate flow at rest and with distal augmentation maneuvers in the common femoral, femoral and popliteal veins. COMPARISON:  None. FINDINGS: Contralateral Common Femoral Vein: Respiratory phasicity is normal and symmetric with the symptomatic side. No evidence of thrombus. Normal compressibility. Common Femoral Vein: No evidence of thrombus. Normal compressibility, respiratory phasicity and response to augmentation. Saphenofemoral Junction: No evidence of thrombus. Normal compressibility and flow on color Doppler imaging. Profunda Femoral Vein: No evidence of thrombus. Normal compressibility and flow on color Doppler imaging. Femoral Vein: No evidence of thrombus. Normal compressibility, respiratory phasicity and response to augmentation. Popliteal Vein: No evidence of thrombus. Normal compressibility, respiratory phasicity and response to augmentation. Calf Veins: There are multiple noncompressible gastrocnemius veins. Some of these veins have occlusive thrombus and others have nonocclusive thrombus. Visualized posterior tibial veins are compressible without thrombus. Superficial Great Saphenous Vein: No evidence of thrombus. Normal compressibility. Other Findings: Small fluid collection in the popliteal fossa measures 3.5 x 0.7 x 1.6 cm. Findings are compatible with a small Baker's cyst. IMPRESSION: Positive for DVT in the left calf. Evidence for thrombus in left gastrocnemius veins. Small left knee Baker's cyst. Electronically Signed   By: Markus Daft M.D.   On: 05/21/2017 18:03    EKG: Independently reviewed.  Assessment/Plan Principal Problem:   Ataxia Active Problems:   Diabetes mellitus type 2, insulin dependent (HCC)   CKD (chronic kidney disease) stage 4, GFR 15-29 ml/min (HCC)   History of CVA (cerebrovascular accident)   Shortness of breath   DVT (deep venous thrombosis) ([place])    1. Ataxia -  with h/o CVA 1. MRI brain ordered and pending 2. If positive then get neuro consult and stroke work up 3. Holding plavix since heparin gtt started 2. SOB - 1. PE vs pulm HTN vs other 2. Given confirmed DVT, have patient on heparin gtt already 3. Getting 2d echo to look for evidence of worsening pulm HTN or R heart strain 4. Holding off on ordering VQ scan for now and will defer decision on this to day team (im not sure a positive or negative VQ scan would change management in this patient who has confirmed DVT and h/o moderately elevated pulmonary artery pressures at baseline). 3. H/o duodenal ulcer - have put patient on Protonix BID as we are starting anticoagulation.  Last bleed was in 2014. 4. DM2 - 1. Lantus 25 daily 2. Sensitive SSI AC 3. 3 units TID AC 5. CKD stage 4 - chronic, stable and baseline 6. HTN - continue home meds  DVT prophylaxis: Heparin gtt Code Status: Full Family Communication: No family in room Disposition Plan: Home after admit Consults called: None Admission status: Place in obs   GARDNER, [facility] Pager [phone]  If 7AM-7PM, please contact day team taking care of patient [email or web address] Password [email or web address]  05/22/2017, 12:58 AM

## 2017-05-22 NOTE — Progress Notes (Signed)
ANTICOAGULATION CONSULT NOTE - Follow Up  Pharmacy Consult for heparin Indication: DVT and stroke  No Known Allergies  Patient Measurements: Height: 5\' 6"  (167.6 cm) Weight: 164 lb 3.9 oz (74.5 kg) IBW/kg (Calculated) : 59.3 Heparin Dosing Weight: 74kg  Vital Signs: Temp: 97.8 F (36.6 C) (03/09 0700) Temp Source: Oral (03/09 0700) BP: 169/75 (03/09 0700) Pulse Rate: 123 (03/09 0700)  Labs: Recent Labs    05/21/17 1430 05/22/17 0111 05/22/17 0551  HGB 12.6  --  11.8*  HCT 37.3  --  34.3*  PLT 198.0  --  163  HEPARINUNFRC  --   --  1.08*  CREATININE 1.81*  --   --   TROPONINI  --  <0.03 <0.03    Estimated Creatinine Clearance: 23.9 mL/min (A) (by C-G formula based on SCr of 1.81 mg/dL (H)).  Assessment: 36 YOF brought to [facility] with weakness/ataxia. Was seen by PCP earlier today and ultrasound revealed saphenous vein clot, also concern patient may have a PE along with possible new stroke due to profound weakness/ataxia that is new. She is not on anticoagulation PTA, no bleeding noted at baseline. CBC is stable.  05/22/2017 - Initial Heparin level 1.08 on 1150 units/hr.  She is going down for MRI - will hold heparin and reduce.  Goal of Therapy:  Heparin level 0.3-0.5 units/ml due to possible stroke Monitor platelets by anticoagulation protocol: Yes   Plan:  Hold heparin x 1 hour and reduce to 950 units/hr Repeat Heparin level in 8 hours after restarting. Daily heparin level and CBC Follow neuro workup and confirmation of DVT/PE  Rober Minion, PharmD., MS Clinical Pharmacist Pager:  [phone] Thank you for allowing pharmacy to be part of this patients care team. 05/22/2017 8:54 AM

## 2017-05-22 NOTE — Progress Notes (Signed)
  Echocardiogram 2D Echocardiogram has been performed.  Destiny [name] T Josselin Gaulin 05/22/2017, 12:01 PM

## 2017-05-22 NOTE — Progress Notes (Signed)
MRI called and wanted to know if pt had any fluids running before pt could head down, Lisa from [facility] was informed pt has Heparin running at 11.81ml/hr. Nurse called pharmacy and spoke to [facility] who inform nurse that pt's  Herparin dose could be pause for pt to head down to MR.

## 2017-05-22 NOTE — Progress Notes (Signed)
[facility] for heparin and warfarin  Indication: acute DVT and atrial fibrillation.   No Known Allergies  Patient Measurements: Height: 5\' 6"  (167.6 cm) Weight: 164 lb 3.9 oz (74.5 kg) IBW/kg (Calculated) : 59.3  Vital Signs: Temp: 97.7 F (36.5 C) (03/09 1200) Temp Source: Oral (03/09 1200) BP: 166/79 (03/09 1200) Pulse Rate: 68 (03/09 1200)  Labs: Recent Labs    05/21/17 1430 05/22/17 0111 05/22/17 0551 05/22/17 1210  HGB 12.6  --  11.8*  --   HCT 37.3  --  34.3*  --   PLT 198.0  --  163  --   HEPARINUNFRC  --   --  1.08*  --   CREATININE 1.81*  --   --   --   TROPONINI  --  <0.03 <0.03 <0.03    Estimated Creatinine Clearance: 23.9 mL/min (A) (by C-G formula based on SCr of 1.81 mg/dL (H)).   Medical History: Past Medical History:  Diagnosis Date  . Acute renal insufficiency 12/24/2013  . Amaurosis fugax   . Arthritis 12/06/2012  . Atherosclerosis   . CAD (coronary artery disease)    a. s/p CABG x 4 (LIMA->LAD, VG->Diag, VG->OM1->OM3);  b. 2010 Cath: 3/4 patent grafts (VG->diag occluded), 3vd, sev PVD ->Med Rx;  c. 12/2011 Lexi MV: sm area of mild mid and apical ant wall ischemia ->med rx.  . Carotid bruit   . Chronic kidney disease (CKD) stage G4/A1, severely decreased glomerular filtration rate (GFR) between 15-29 mL/min/1.73 square meter and albuminuria creatinine ratio less than 30 mg/g (HCC)    GFR 25 on 12/25/13  . CVA (cerebral infarction)   . Dementia    pt denies  . Depression 12/24/2013  . Diabetes mellitus type II    insulin dependent.   . Diabetic peripheral neuropathy ([place])   . Diabetic retinopathy   . Diverticulosis   . Duodenal ulcer 2009 and 11/2011   caused GI bleeding  . Gallstones 2009   seen on CT scan  . GI bleed 04/2011, 11/2011   found non-bleeding duodenal ulcers  . Hyperlipidemia   . Hypersomnolence 03/12/2013  . Hypertension   . Iron deficiency anemia secondary to blood loss (chronic)   .  Melena 1015/2015   PEPTIC ULCER    REQUIRING TRANSFUSION  . Pedal edema 10/12/2016  . PVD (peripheral vascular disease) ([place])   . Rib fracture 04/21/2015  . Thiamine deficiency 08/13/2015  . UTI (urinary tract infection) 04/08/2014  . Weight loss, non-intentional 01/29/2014    Assessment: 84 yo female currently on heparin infusion for acute DVT and afib with RVR to start warfarin. Pt with hx of CVA but MRI with no evidence of acute CVA. No known a/c PTA. CBC stable.   Goal of Therapy:  INR 2-3 Heparin level 0.3-0.7 units/ml Monitor platelets by anticoagulation protocol: Yes   Plan:  Continue heparin infusion at 950 units/hr Warfarin 4 mg x 1 this evening  Daily heparin level and INR  Vincenza Hews, PharmD, BCPS 05/22/2017, 4:19 PM

## 2017-05-22 NOTE — Plan of Care (Signed)
  Education: Knowledge of General Education information will improve 05/22/2017 2012 - Progressing by Bronson Curb, RN   Health Behavior/Discharge Planning: Ability to manage health-related needs will improve 05/22/2017 2012 - Progressing by Dawnn Nam, Lanetta Inch, RN   Consults Venous Thromboembolism Patient Education Description See Patient Education Module for education specifics. 05/22/2017 2012 - Progressing by Bronson Curb, RN Diagnosis - Venous Thromboembolism (VTE) Description Choose a selection 05/22/2017 2012 - Progressing by Bronson Curb, RN Pharmacy Consult for anticoagulation 05/22/2017 2012 - Completed/Met by Bronson Curb, RN Skin Care Protocol Initiated - if Braden Score 18 or less Description If consults are not indicated, leave blank or document N/A 05/22/2017 2012 - Progressing by Bronson Curb, RN Diabetes Guidelines if Diabetic/Glucose > 140 Description If diabetic or lab glucose is > 140 mg/dl - Initiate Diabetes/Hyperglycemia Guidelines & Document Interventions  05/22/2017 2012 - Progressing by Bronson Curb, RN   Phase I Progression Outcomes Pain controlled with appropriate interventions 05/22/2017 2012 - Progressing by Bronson Curb, RN Dyspnea controlled at rest (PE) 05/22/2017 2012 - Progressing by Bronson Curb, RN Tolerating diet 05/22/2017 2012 - Progressing by Bronson Curb, RN Initial discharge plan identified 05/22/2017 2012 - Progressing by Bronson Curb, RN Voiding-avoid urinary catheter unless indicated 05/22/2017 2012 - Progressing by Bronson Curb, RN Hemodynamically stable 05/22/2017 2012 - Progressing by Bronson Curb, RN Other Phase I Outcomes/Goals 05/22/2017 2012 - Progressing by Bronson Curb, RN   Education: Knowledge of secondary prevention will improve 05/22/2017 2012 - Progressing by Bronson Curb, RN Knowledge of patient specific risk factors addressed and post  discharge goals established will improve 05/22/2017 2012 - Progressing by Monicia Tse, Lanetta Inch, RN

## 2017-05-22 NOTE — Progress Notes (Signed)
Review of telemetry reviewed and noted possible afib on monitor.  Pt denies history of afib and no hx of afib noted on chart review.  EKG obtained per protocol. Admitting MD notified of EKG stating "Atrial fibrillation with HR 63"- MD to review EKG in AM. Pt denies dizziness, chest pain, shortness of breath.  Pt states "breathing is better than earlier."

## 2017-05-22 NOTE — Progress Notes (Signed)
PROGRESS NOTE  Destiny [name] QPY:[id number] DOB: 01-10-1932 DOA: 05/21/2017 PCP: Mosie Lukes, MD  HPI/Recap of past 24 hours:  Destiny [name] is a 84 y.o. year old female with medical history significant for history of CVA, dementia, CKD stage III, CAD status post CABG, hypertension, who presented on 05/21/2017 with left lower leg swelling at PCPs office and was found to have acute left DVT and new onset atrial fibrillation with RVR.    Interval History Patient denies any current shortness of breath.  No chest pain.  Denies palpitations.  Assessment/Plan: Principal Problem:   Ataxia Active Problems:   Essential hypertension   Diabetes mellitus type 2, insulin dependent (HCC)   CKD (chronic kidney disease) stage 4, GFR 15-29 ml/min (HCC)   History of CVA (cerebrovascular accident)   Shortness of breath   DVT (deep venous thrombosis) (HCC)   #Acute L DVT, stable TTE shows RVSP of 35, slight elevation unclear if related to potential PE Unable to obtain CTA given CKD History of GI bleeds in the past (2015, 2009 (melena related to duodenal ulcers) No family history of DVTs, patient-family reports sedentary at baseline - heparin gtt, monitor CBC -We will not pursue VQ scan given presence of PE would not change current management with anticoagulation -NOACs not an option in light of CKD -PT eval and treat  #Shortness of breath, improved Findings from TTE did not definitively diagnose PE, limited given no ability to get CTA Patient currently normal oxygen saturation on room air Possible dyspnea related to atrial fibrillation with intermittent bouts of RVR as mentioned below -Continue to monitor  #Atrial Fibrillation, new onset, currently rate controlled Intermittent bouts of RVR but self converted, has not required beta-blockade CHADSVASC: 7HTN (1), age greater than 41 (2), TIA/stroke (2), female (1), diabetes (1) - continue to monitor on telemetry - If develops RVR--> as  needed IV metoprolol - Heparin gtt bridge to warfarin (start on 3/10 evening) - NOACs not an option in light of CKD  #Unsteady gait, weakness MRI negative for any acute abnormalities, consistent with chronic ischemic changes Potentially related to acute left DVT and dyspnea associated with new onset atrial fibrillation -PT eval and treat -Follow-up vitamin B12  #CKD, stage IV, stable -monitor on BMP -avoid nephrotoxins  #Type 2 diabetes A1c 8.4 (01/2017) -Lantus 25 units, correction coverage, NovoLog 3 units 3 times daily with meals   #Hypertension, not at goal Continue home amlodipine, Maxzide  #COPD, stable Clear breath sounds on exam -To new home Atrovent as needed, albuterol as needed,  #GERD, stable -Continue Zantac  #CAD status post CABG x4 (2010), stable No chest pain -Continue home Imdur 60 mg a.m., 30 mg p.m. - Hold home Plavix in light of current heparin drip and history of GI bleeds  #Hyperlipidemia -Continue home atorvastatin  #Depression, stable -Continue Celexa  #Iron deficiency anemia, chronic, stable -Monitoring CBC, continue home oral iron  #Mild dementia -Has failed prior trials of Aricept and Namenda  Code Status: Full code  Family Communication: Spoke at bedside with son [name]  Disposition Plan: Monitor CBC and profile on heparin drip, plan to start warfarin on 3/10, continue to monitor heart rate and breathing status with new onset atrial fibrillation   Consultants:  None  Procedures:  TTE on 05/22/17: EF 50-55%.  No wall motion normalities.  Pulmonary artery systolic pressure 35  Antimicrobials: None Cultures: None   Telemetry: Heparin drip  DVT prophylaxis: heparin drip   Objective: Vitals:   05/22/17  0218 05/22/17 0400 05/22/17 0700 05/22/17 1200  BP: (!) 158/71 (!) 152/67 (!) 169/75 (!) 166/79  Pulse: 61 68 (!) 123 68  Resp: 18 18 18 18   Temp:  98 F (36.7 C) 97.8 F (36.6 C) 97.7 F (36.5 C)  TempSrc:  Oral Oral  Oral  SpO2: 98% 99% 100% 100%  Weight:      Height:        Intake/Output Summary (Last 24 hours) at 05/22/2017 1452 Last data filed at 05/22/2017 0500 Gross per 24 hour  Intake 87.59 ml  Output -  Net 87.59 ml   Filed Weights   05/21/17 2026 05/22/17 0020  Weight: 74 kg (163 lb 2.3 oz) 74.5 kg (164 lb 3.9 oz)    Exam:  General: Comfortably lying in bed Eyes: EOMI, anicteric ENT: Oral Mucosa clear and moist Cardiovascular: regular rate and rhythm, no murmurs, rubs or gallops, no appreciable edema Respiratory: Normal respiratory effort on room air, normal breath sounds, occasional rhonchi left lung field particularly at bases  Abdomen: soft, non-distended, non-tender, normal bowel sounds Skin: No Rash Musculoskeletal:Good ROM, no contractures. Normal muscle tone Neurologic: Grossly no focal neuro deficit.Mental status alert and oriented to person, place, month, and self Psychiatric:Appropriate affect, and mood  Data Reviewed: CBC: Recent Labs  Lab 05/21/17 1430 05/22/17 0551  WBC 8.2 7.6  NEUTROABS 5.0  --   HGB 12.6 11.8*  HCT 37.3 34.3*  MCV 92.4 91.7  PLT 198.0 161   Basic Metabolic Panel: Recent Labs  Lab 05/21/17 1430  NA 137  K 4.0  CL 102  CO2 27  GLUCOSE 270*  BUN 38*  CREATININE 1.81*  CALCIUM 9.7   GFR: Estimated Creatinine Clearance: 23.9 mL/min (A) (by C-G formula based on SCr of 1.81 mg/dL (H)). Liver Function Tests: Recent Labs  Lab 05/21/17 1430  AST 13  ALT 15  ALKPHOS 72  BILITOT 0.9  PROT 6.8  ALBUMIN 3.8   No results for input(s): LIPASE, AMYLASE in the last 168 hours. No results for input(s): AMMONIA in the last 168 hours. Coagulation Profile: No results for input(s): INR, PROTIME in the last 168 hours. Cardiac Enzymes: Recent Labs  Lab 05/22/17 0111 05/22/17 0551 05/22/17 1210  TROPONINI <0.03 <0.03 <0.03   BNP (last 3 results) Recent Labs    05/21/17 1430  PROBNP 210.0*   HbA1C: No results for input(s): HGBA1C in  the last 72 hours. CBG: Recent Labs  Lab 05/22/17 0608 05/22/17 1246  GLUCAP 288* 174*   Lipid Profile: No results for input(s): CHOL, HDL, LDLCALC, TRIG, CHOLHDL, LDLDIRECT in the last 72 hours. Thyroid Function Tests: No results for input(s): TSH, T4TOTAL, FREET4, T3FREE, THYROIDAB in the last 72 hours. Anemia Panel: No results for input(s): VITAMINB12, FOLATE, FERRITIN, TIBC, IRON, RETICCTPCT in the last 72 hours. Urine analysis:    Component Value Date/Time   COLORURINE YELLOW 02/02/2017 1939   APPEARANCEUR CLOUDY (A) 02/02/2017 1939   LABSPEC 1.013 02/02/2017 1939   PHURINE 5.0 02/02/2017 1939   GLUCOSEU NEGATIVE 02/02/2017 1939   GLUCOSEU 100 (A) 01/26/2017 1114   HGBUR SMALL (A) 02/02/2017 1939   HGBUR large 04/26/2009 1110   BILIRUBINUR Negative 05/21/2017 1512   KETONESUR NEGATIVE 02/02/2017 1939   PROTEINUR 1+ 05/21/2017 1512   PROTEINUR 100 (A) 02/02/2017 1939   UROBILINOGEN negative (A) 05/21/2017 1512   UROBILINOGEN 0.2 01/26/2017 1114   NITRITE Negative 05/21/2017 1512   NITRITE NEGATIVE 02/02/2017 1939   LEUKOCYTESUR Negative 05/21/2017 1512   Sepsis  Labs: @LABRCNTIP (procalcitonin:4,lacticidven:4)  )No results found for this or any previous visit (from the past 240 hour(s)).    Studies: Dg Chest 2 View  Result Date: 05/21/2017 CLINICAL DATA:  Cough and intermittent wheezing. EXAM: CHEST - 2 VIEW COMPARISON:  02/01/2017 FINDINGS: Chronic cardiomegaly. There is aortic tortuosity that is stable. Status post CABG. The lungs are clear. Mild hyperinflation. No acute osseous finding. IMPRESSION: 1. No evidence of active disease. 2. Chronic cardiomegaly. Electronically Signed   By: Monte Fantasia M.D.   On: 05/21/2017 17:57   Ct Head Wo Contrast  Result Date: 05/21/2017 CLINICAL DATA:  84 year old female with increased dizziness. EXAM: CT HEAD WITHOUT CONTRAST TECHNIQUE: Contiguous axial images were obtained from the base of the skull through the vertex without  intravenous contrast. COMPARISON:  02/02/2017 CT and prior studies FINDINGS: Brain: No evidence of acute infarction, hemorrhage, hydrocephalus, extra-axial collection or mass lesion/mass effect. Atrophy, chronic small-vessel white matter ischemic changes again noted. Remote bilateral basal ganglia, periventricular white matter and RIGHT parietal/occipital infarcts again noted. Vascular: Atherosclerotic calcifications again noted. Skull: Normal. Negative for fracture or focal lesion. Sinuses/Orbits: No acute abnormality Other: None IMPRESSION: 1. No evidence of acute intracranial abnormality 2. Atrophy, chronic small-vessel white matter ischemic changes and remote infarcts as described. Electronically Signed   By: Margarette Canada M.D.   On: 05/21/2017 18:01   Mr Brain Wo Contrast  Result Date: 05/22/2017 CLINICAL DATA:  Ataxia.  History of stroke. EXAM: MRI HEAD WITHOUT CONTRAST TECHNIQUE: Multiplanar, multiecho pulse sequences of the brain and surrounding structures were obtained without intravenous contrast. COMPARISON:  Head CT 05/21/2017 and MRI 02/02/2017 FINDINGS: Brain: There is no evidence of acute infarct, intracranial hemorrhage, mass, midline shift, or extra-axial fluid collection. A partially empty sella is again incidentally noted. There is moderate cerebral atrophy. Chronic infarcts are again seen in the left greater than right basal ganglia, deep white matter on the left, and right occipital lobe. There also may be a tiny chronic left cerebellar infarct. T2 hyperintensities elsewhere in the cerebral white matter bilaterally are similar to the prior MRI and nonspecific but compatible with moderate chronic small vessel ischemic disease. Ex vacuo enlargement of the left lateral ventricle and a cavum septum pellucidum et vergae are noted. Vascular: Stable abnormal appearance of the distal right V4 segment likely reflecting chronic occlusion distal to PICA. Other major intracranial vascular flow voids are  preserved. Skull and upper cervical spine: Unremarkable bone marrow signal. Sinuses/Orbits: Bilateral cataract extraction. No significant sinus disease. Other: None. IMPRESSION: 1. No acute intracranial abnormality. 2. Moderate chronic small vessel ischemic disease and cerebral atrophy with chronic infarcts as above. Electronically Signed   By: Logan Bores M.D.   On: 05/22/2017 10:51   US Venous Img Lower Unilateral Left  Result Date: 05/21/2017 CLINICAL DATA:  Left knee weakness and swelling for 2 days. EXAM: LEFT LOWER EXTREMITY VENOUS DOPPLER ULTRASOUND TECHNIQUE: Gray-scale sonography with graded compression, as well as color Doppler and duplex ultrasound were performed to evaluate the lower extremity deep venous systems from the level of the common femoral vein and including the common femoral, femoral, profunda femoral, popliteal and calf veins including the posterior tibial, peroneal and gastrocnemius veins when visible. The superficial great saphenous vein was also interrogated. Spectral Doppler was utilized to evaluate flow at rest and with distal augmentation maneuvers in the common femoral, femoral and popliteal veins. COMPARISON:  None. FINDINGS: Contralateral Common Femoral Vein: Respiratory phasicity is normal and symmetric with the symptomatic side. No evidence of  thrombus. Normal compressibility. Common Femoral Vein: No evidence of thrombus. Normal compressibility, respiratory phasicity and response to augmentation. Saphenofemoral Junction: No evidence of thrombus. Normal compressibility and flow on color Doppler imaging. Profunda Femoral Vein: No evidence of thrombus. Normal compressibility and flow on color Doppler imaging. Femoral Vein: No evidence of thrombus. Normal compressibility, respiratory phasicity and response to augmentation. Popliteal Vein: No evidence of thrombus. Normal compressibility, respiratory phasicity and response to augmentation. Calf Veins: There are multiple  noncompressible gastrocnemius veins. Some of these veins have occlusive thrombus and others have nonocclusive thrombus. Visualized posterior tibial veins are compressible without thrombus. Superficial Great Saphenous Vein: No evidence of thrombus. Normal compressibility. Other Findings: Small fluid collection in the popliteal fossa measures 3.5 x 0.7 x 1.6 cm. Findings are compatible with a small Baker's cyst. IMPRESSION: Positive for DVT in the left calf. Evidence for thrombus in left gastrocnemius veins. Small left knee Baker's cyst. Electronically Signed   By: Markus Daft M.D.   On: 05/21/2017 18:03    Scheduled Meds: . amLODipine  5 mg Oral Daily  . atorvastatin  20 mg Oral Daily  . cholecalciferol  1,000 Units Oral QPM  . citalopram  20 mg Oral Daily  . ferrous sulfate  325 mg Oral Q breakfast  . insulin aspart  0-9 Units Subcutaneous TID WC  . insulin aspart  3 Units Subcutaneous TID WC  . insulin glargine  25 Units Subcutaneous Daily  . [START ON 05/23/2017] isosorbide mononitrate  30 mg Oral QHS  . isosorbide mononitrate  60 mg Oral Daily  . pantoprazole  40 mg Oral BID  . [START ON 05/24/2017] thiamine  100 mg Oral Q M,W,F  . triamterene-hydrochlorothiazide  0.5 tablet Oral Daily    Continuous Infusions: . heparin 950 Units/hr (05/22/17 1236)     LOS: 0 days     Desiree Hane, MD [facility] Pager [phone]  If 7PM-7AM, please contact night-coverage [email or web address] Password [email or web address] 05/22/2017, 2:52 PM

## 2017-05-22 NOTE — Progress Notes (Signed)
[place] for heparin and warfarin  Indication: acute DVT and atrial fibrillation  No Known Allergies  Patient Measurements: Height: 5\' 6"  (167.6 cm) Weight: 164 lb 3.9 oz (74.5 kg) IBW/kg (Calculated) : 59.3  Vital Signs: Temp: 97.7 F (36.5 C) (03/09 2031) Temp Source: Oral (03/09 2031) BP: 142/70 (03/09 2031) Pulse Rate: 77 (03/09 2031)  Labs: Recent Labs    05/21/17 1430 05/22/17 0111 05/22/17 0551 05/22/17 1210 05/22/17 2231  HGB 12.6  --  11.8*  --   --   HCT 37.3  --  34.3*  --   --   PLT 198.0  --  163  --   --   LABPROT  --   --   --   --  14.5  INR  --   --   --   --  1.14  HEPARINUNFRC  --   --  1.08*  --  0.69  CREATININE 1.81*  --   --   --   --   TROPONINI  --  <0.03 <0.03 <0.03  --      Medical History: Past Medical History:  Diagnosis Date  . Acute renal insufficiency 12/24/2013  . Amaurosis fugax   . Arthritis 12/06/2012  . Atherosclerosis   . CAD (coronary artery disease)    a. s/p CABG x 4 (LIMA->LAD, VG->Diag, VG->OM1->OM3);  b. 2010 Cath: 3/4 patent grafts (VG->diag occluded), 3vd, sev PVD ->Med Rx;  c. 12/2011 Lexi MV: sm area of mild mid and apical ant wall ischemia ->med rx.  . Carotid bruit   . Chronic kidney disease (CKD) stage G4/A1, severely decreased glomerular filtration rate (GFR) between 15-29 mL/min/1.73 square meter and albuminuria creatinine ratio less than 30 mg/g (HCC)    GFR 25 on 12/25/13  . CVA (cerebral infarction)   . Dementia    pt denies  . Depression 12/24/2013  . Diabetes mellitus type II    insulin dependent.   . Diabetic peripheral neuropathy ([place])   . Diabetic retinopathy   . Diverticulosis   . Duodenal ulcer 2009 and 11/2011   caused GI bleeding  . Gallstones 2009   seen on CT scan  . GI bleed 04/2011, 11/2011   found non-bleeding duodenal ulcers  . Hyperlipidemia   . Hypersomnolence 03/12/2013  . Hypertension   . Iron deficiency anemia secondary to blood loss  (chronic)   . Melena 1015/2015   PEPTIC ULCER    REQUIRING TRANSFUSION  . Pedal edema 10/12/2016  . PVD (peripheral vascular disease) ([place])   . Rib fracture 04/21/2015  . Thiamine deficiency 08/13/2015  . UTI (urinary tract infection) 04/08/2014  . Weight loss, non-intentional 01/29/2014    Assessment: 84 yo female currently on heparin infusion for acute DVT and afib with RVR to start warfarin. Pt with hx of CVA but MRI with no evidence of acute CVA. No known a/c PTA. CBC stable.   Heparin level is wnl but at very high end of therapeutic range.   Goal of Therapy:  INR 2-3 Heparin level 0.3-0.7 units/ml Monitor platelets by anticoagulation protocol: Yes    Plan:  Reduce heparin infusion to 850 units/hr Daily heparin level and INR Check 8 hr level   Destiny [name] 05/22/2017 11:19 PM

## 2017-05-22 NOTE — Progress Notes (Signed)
Patient off unit most of morning for procedure( MRI/ECHO), morning medications held until patients return. Family arrived update given and waiting in room until patients return.

## 2017-05-22 NOTE — Plan of Care (Signed)
Pt educated and symptoms being managed on current regimen

## 2017-05-23 DIAGNOSIS — N184 Chronic kidney disease, stage 4 (severe): Secondary | ICD-10-CM | POA: Diagnosis not present

## 2017-05-23 DIAGNOSIS — I4891 Unspecified atrial fibrillation: Secondary | ICD-10-CM | POA: Diagnosis not present

## 2017-05-23 DIAGNOSIS — Z8673 Personal history of transient ischemic attack (TIA), and cerebral infarction without residual deficits: Secondary | ICD-10-CM | POA: Diagnosis not present

## 2017-05-23 DIAGNOSIS — I824Z2 Acute embolism and thrombosis of unspecified deep veins of left distal lower extremity: Secondary | ICD-10-CM | POA: Diagnosis not present

## 2017-05-23 DIAGNOSIS — R270 Ataxia, unspecified: Secondary | ICD-10-CM | POA: Diagnosis not present

## 2017-05-23 DIAGNOSIS — E119 Type 2 diabetes mellitus without complications: Secondary | ICD-10-CM | POA: Diagnosis not present

## 2017-05-23 DIAGNOSIS — I82492 Acute embolism and thrombosis of other specified deep vein of left lower extremity: Secondary | ICD-10-CM | POA: Diagnosis not present

## 2017-05-23 DIAGNOSIS — I10 Essential (primary) hypertension: Secondary | ICD-10-CM | POA: Diagnosis not present

## 2017-05-23 DIAGNOSIS — Z794 Long term (current) use of insulin: Secondary | ICD-10-CM | POA: Diagnosis not present

## 2017-05-23 LAB — HEPARIN LEVEL (UNFRACTIONATED): HEPARIN UNFRACTIONATED: 0.59 [IU]/mL (ref 0.30–0.70)

## 2017-05-23 LAB — GLUCOSE, CAPILLARY
GLUCOSE-CAPILLARY: 179 mg/dL — AB (ref 65–99)
GLUCOSE-CAPILLARY: 275 mg/dL — AB (ref 65–99)
GLUCOSE-CAPILLARY: 293 mg/dL — AB (ref 65–99)
Glucose-Capillary: 195 mg/dL — ABNORMAL HIGH (ref 65–99)
Glucose-Capillary: 202 mg/dL — ABNORMAL HIGH (ref 65–99)

## 2017-05-23 LAB — CBC
HEMATOCRIT: 37.2 % (ref 36.0–46.0)
Hemoglobin: 12.6 g/dL (ref 12.0–15.0)
MCH: 31 pg (ref 26.0–34.0)
MCHC: 33.9 g/dL (ref 30.0–36.0)
MCV: 91.4 fL (ref 78.0–100.0)
Platelets: 169 10*3/uL (ref 150–400)
RBC: 4.07 MIL/uL (ref 3.87–5.11)
RDW: 13.2 % (ref 11.5–15.5)
WBC: 8.5 10*3/uL (ref 4.0–10.5)

## 2017-05-23 LAB — PROTIME-INR
INR: 1.13
Prothrombin Time: 14.4 seconds (ref 11.4–15.2)

## 2017-05-23 MED ORDER — WARFARIN SODIUM 4 MG PO TABS
4.0000 mg | ORAL_TABLET | Freq: Once | ORAL | Status: AC
  Administered 2017-05-23: 4 mg via ORAL
  Filled 2017-05-23: qty 1

## 2017-05-23 NOTE — Care Management Note (Signed)
Case Management Note  Patient Details  Name: Destiny [name] MRN: [id number] Date of Birth: 1932/02/02  Subjective/Objective:       Pt presented from home for ataxia and new onset afib.  Pt lives alone and has a caregiver "half a day" Monday through Friday.  Pt has a walker and cane but functions independently and does not use.  Pt does not drive and has caregiver to drive her to pharmacy/appointments. Pt slightly confused during our conversation and attempts to correct herself with some answers.              Action/Plan: Offered choice for [facility] services.  Pt states she will let her son decide.  He is at a funeral today for her best friend and will return later today or tomorrow.  [place] list left with patient.    Expected Discharge Date:  05/24/17               Expected Discharge Plan:  [place]  In-House Referral:  NA  Discharge planning Services  CM Consult  Post Acute Care Choice:  Home Health Choice offered to:  Patient  DME Arranged:    DME Agency:     HH Arranged:    [place] Agency:     Status of Service:  In process, will continue to follow  If discussed at Long Length of Stay Meetings, dates discussed:    Additional Comments:  Claudie Leach, RN 05/23/2017, 4:24 PM

## 2017-05-23 NOTE — Progress Notes (Signed)
PROGRESS NOTE  Destiny [name] QHU:[id number] DOB: 19-Feb-1932 DOA: 05/21/2017 PCP: Mosie Lukes, MD  HPI/Recap of past 24 hours:  Destiny [name] is a 84 y.o. year old female with medical history significant for history of CVA, dementia, CKD stage III, CAD status post CABG, hypertension, who presented on 05/21/2017 with left lower leg swelling at PCPs office and was found to have acute left DVT and new onset atrial fibrillation with RVR.    Interval History No shortness of breath or chest pain.  No acute events overnight.  Assessment/Plan: Principal Problem:   Ataxia Active Problems:   Essential hypertension   Diabetes mellitus type 2, insulin dependent (HCC)   CKD (chronic kidney disease) stage 4, GFR 15-29 ml/min (HCC)   History of CVA (cerebrovascular accident)   Shortness of breath   DVT (deep venous thrombosis) (HCC)   Atrial fibrillation with RVR (HCC)   #Acute L DVT, stable TTE shows RVSP of 35, slight elevation unclear if related to potential PE Unable to obtain CTA given CKD History of GI bleeds in the past (2015, 2009 (melena related to duodenal ulcers) No family history of DVTs, patient-family reports sedentary at baseline - heparin gtt, monitor CBC -We will not pursue VQ scan given presence of PE would not change current management with anticoagulation -Bridge to warfarin therapy, daily INR, goal INR 2-3 -PT eval and treat  #Shortness of breath, improved Findings from TTE did not definitively diagnose PE, limited given no ability to get CTA Patient currently normal oxygen saturation on room air Possible dyspnea related to atrial fibrillation with intermittent bouts of RVR as mentioned below -Continue to monitor  #Atrial Fibrillation, new onset, currently rate controlled Intermittent bouts of RVR but self converted, has not required beta-blockade CHADSVASC: 7HTN (1), age greater than 47 (2), TIA/stroke (2), female (1), diabetes (1) - continue to monitor on  telemetry - If develops RVR--> as needed IV metoprolol - Heparin gtt bridge to warfarin, daily INR - NOACs not an option in light of CKD  #Unsteady gait, weakness MRI negative for any acute abnormalities, consistent with chronic ischemic changes Potentially related to acute left DVT and dyspnea associated with new onset atrial fibrillation.  B12 within normal limits -PT eval and treat  #CKD, stage IV, stable -monitor on BMP -avoid nephrotoxins  #Type 2 diabetes A1c 8.4 (01/2017) -Lantus 25 units, correction coverage, NovoLog 3 units 3 times daily with meals  #Hypertension, not at goal Continue home amlodipine, Maxzide  #COPD, stable Clear breath sounds on exam -home Atrovent as needed, albuterol as needed,  #GERD, stable -Continue Zantac  #CAD status post CABG x4 (2010), stable No chest pain -Continue home Imdur 60 mg a.m., 30 mg p.m. - Hold home Plavix in light of current heparin drip and history of GI bleeds  #Hyperlipidemia -Continue home atorvastatin  #Depression, stable -Continue Celexa  #Iron deficiency anemia, chronic, stable -Monitoring CBC while on anticoagulation, continue home oral iron  #Mild dementia -Has failed prior trials of Aricept and Namenda  Code Status: Full code  Family Communication: Will speak with family toay  Disposition Plan: Monitor CBC while on heparin drip, warfarin awaiting INR 2-3, continue to monitor heart rate and breathing status with new onset atrial fibrillation   Consultants:  None  Procedures:  TTE on 05/22/17: EF 50-55%.  No wall motion normalities.  Pulmonary artery systolic pressure 35  Antimicrobials: None Cultures: None   Telemetry: Heparin drip  DVT prophylaxis: heparin drip   Objective: Vitals:  05/22/17 2031 05/22/17 2340 05/23/17 0321 05/23/17 0811  BP: (!) 142/70 130/64 (!) 145/60 (!) 146/63  Pulse: 77 65 66 64  Resp:  17 18 18   Temp: 97.7 F (36.5 C) 98.1 F (36.7 C) 97.8 F (36.6 C) 97.8  F (36.6 C)  TempSrc: Oral Oral Oral Oral  SpO2: 97% 100% 96% 98%  Weight:      Height:        Intake/Output Summary (Last 24 hours) at 05/23/2017 1223 Last data filed at 05/23/2017 0900 Gross per 24 hour  Intake 184.72 ml  Output -  Net 184.72 ml   Filed Weights   05/21/17 2026 05/22/17 0020  Weight: 74 kg (163 lb 2.3 oz) 74.5 kg (164 lb 3.9 oz)    Exam:  General: Comfortably lying in bed Eyes: EOMI, anicteric ENT: Oral Mucosa clear and moist Cardiovascular: regular rate and rhythm, no murmurs, rubs or gallops, no appreciable edema Respiratory: Normal respiratory effort on room air, normal breath sounds,  Abdomen: soft, non-distended, non-tender, normal bowel sounds Skin: No Rash Musculoskeletal:Good ROM, no contractures. Normal muscle tone Neurologic: Grossly no focal neuro deficit.Mental status alert and oriented to person, place, month, and self Psychiatric:Appropriate affect, and mood  Data Reviewed: CBC: Recent Labs  Lab 05/21/17 1430 05/22/17 0551 05/23/17 0729  WBC 8.2 7.6 8.5  NEUTROABS 5.0  --   --   HGB 12.6 11.8* 12.6  HCT 37.3 34.3* 37.2  MCV 92.4 91.7 91.4  PLT 198.0 163 948   Basic Metabolic Panel: Recent Labs  Lab 05/21/17 1430 05/22/17 2231  NA 137 135  K 4.0 4.1  CL 102 101  CO2 27 22  GLUCOSE 270* 198*  BUN 38* 45*  CREATININE 1.81* 2.26*  CALCIUM 9.7 9.4   GFR: Estimated Creatinine Clearance: 19.1 mL/min (A) (by C-G formula based on SCr of 2.26 mg/dL (H)). Liver Function Tests: Recent Labs  Lab 05/21/17 1430  AST 13  ALT 15  ALKPHOS 72  BILITOT 0.9  PROT 6.8  ALBUMIN 3.8   No results for input(s): LIPASE, AMYLASE in the last 168 hours. No results for input(s): AMMONIA in the last 168 hours. Coagulation Profile: Recent Labs  Lab 05/22/17 2231 05/23/17 0729  INR 1.14 1.13   Cardiac Enzymes: Recent Labs  Lab 05/22/17 0111 05/22/17 0551 05/22/17 1210  TROPONINI <0.03 <0.03 <0.03   BNP (last 3 results) Recent  Labs    05/21/17 1430  PROBNP 210.0*   HbA1C: No results for input(s): HGBA1C in the last 72 hours. CBG: Recent Labs  Lab 05/22/17 1246 05/22/17 1649 05/22/17 2105 05/23/17 0558 05/23/17 1128  GLUCAP 174* 226* 179* 195* 202*   Lipid Profile: No results for input(s): CHOL, HDL, LDLCALC, TRIG, CHOLHDL, LDLDIRECT in the last 72 hours. Thyroid Function Tests: No results for input(s): TSH, T4TOTAL, FREET4, T3FREE, THYROIDAB in the last 72 hours. Anemia Panel: Recent Labs    05/22/17 2231  VITAMINB12 312   Urine analysis:    Component Value Date/Time   COLORURINE YELLOW 02/02/2017 1939   APPEARANCEUR CLOUDY (A) 02/02/2017 1939   LABSPEC 1.013 02/02/2017 1939   PHURINE 5.0 02/02/2017 1939   GLUCOSEU NEGATIVE 02/02/2017 1939   GLUCOSEU 100 (A) 01/26/2017 1114   HGBUR SMALL (A) 02/02/2017 1939   HGBUR large 04/26/2009 1110   BILIRUBINUR Negative 05/21/2017 1512   KETONESUR NEGATIVE 02/02/2017 1939   PROTEINUR 1+ 05/21/2017 1512   PROTEINUR 100 (A) 02/02/2017 1939   UROBILINOGEN negative (A) 05/21/2017 1512   UROBILINOGEN 0.2  01/26/2017 1114   NITRITE Negative 05/21/2017 1512   NITRITE NEGATIVE 02/02/2017 1939   LEUKOCYTESUR Negative 05/21/2017 1512   Sepsis Labs: @LABRCNTIP (procalcitonin:4,lacticidven:4)  ) Recent Results (from the past 240 hour(s))  Urine Culture     Status: None   Collection Time: 05/21/17  3:55 PM  Result Value Ref Range Status   MICRO NUMBER: [id number]  Final   SPECIMEN QUALITY: ADEQUATE  Final   Sample Source URINE  Final   STATUS: FINAL  Final   Result: No Growth  Final      Studies: No results found.  Scheduled Meds: . amLODipine  5 mg Oral Daily  . atorvastatin  20 mg Oral Daily  . cholecalciferol  1,000 Units Oral QPM  . citalopram  20 mg Oral Daily  . ferrous sulfate  325 mg Oral Q breakfast  . insulin aspart  0-9 Units Subcutaneous TID WC  . insulin aspart  3 Units Subcutaneous TID WC  . insulin glargine  25 Units  Subcutaneous Daily  . isosorbide mononitrate  30 mg Oral QHS  . isosorbide mononitrate  60 mg Oral Daily  . pantoprazole  40 mg Oral BID  . [START ON 05/24/2017] thiamine  100 mg Oral Q M,W,F  . triamterene-hydrochlorothiazide  0.5 tablet Oral Daily  . warfarin  4 mg Oral ONCE-1800  . Warfarin - Pharmacist Dosing Inpatient   Does not apply q1800    Continuous Infusions: . heparin 850 Units/hr (05/22/17 2356)     LOS: 0 days     Desiree Hane, MD [facility] Pager [phone]  If 7PM-7AM, please contact night-coverage [email or web address] Password [facility] 05/23/2017, 12:23 PM

## 2017-05-23 NOTE — Progress Notes (Signed)
Pt drowsy , more alert once family arrive, family assist with ADL's, staff offered to assist. Family requesting MD(Netty),for an update. MDTeryl Lucy) notified.

## 2017-05-23 NOTE — Progress Notes (Signed)
[place] for Heparin and Warfarin  Indication: Acute DVT and Atrial fibrillation  No Known Allergies  Patient Measurements: Height: 5\' 6"  (167.6 cm) Weight: 164 lb 3.9 oz (74.5 kg) IBW/kg (Calculated) : 59.3  Vital Signs: Temp: 97.8 F (36.6 C) (03/10 0811) Temp Source: Oral (03/10 0811) BP: 146/63 (03/10 0811) Pulse Rate: 64 (03/10 0811)  Labs: Recent Labs    05/21/17 1430 05/22/17 0111 05/22/17 0551 05/22/17 1210 05/22/17 2231 05/23/17 0729  HGB 12.6  --  11.8*  --   --  12.6  HCT 37.3  --  34.3*  --   --  37.2  PLT 198.0  --  163  --   --  169  LABPROT  --   --   --   --  14.5 14.4  INR  --   --   --   --  1.14 1.13  HEPARINUNFRC  --   --  1.08*  --  0.69 0.59  CREATININE 1.81*  --   --   --  2.26*  --   TROPONINI  --  <0.03 <0.03 <0.03  --   --      Medical History: Past Medical History:  Diagnosis Date  . Acute renal insufficiency 12/24/2013  . Amaurosis fugax   . Arthritis 12/06/2012  . Atherosclerosis   . CAD (coronary artery disease)    a. s/p CABG x 4 (LIMA->LAD, VG->Diag, VG->OM1->OM3);  b. 2010 Cath: 3/4 patent grafts (VG->diag occluded), 3vd, sev PVD ->Med Rx;  c. 12/2011 Lexi MV: sm area of mild mid and apical ant wall ischemia ->med rx.  . Carotid bruit   . Chronic kidney disease (CKD) stage G4/A1, severely decreased glomerular filtration rate (GFR) between 15-29 mL/min/1.73 square meter and albuminuria creatinine ratio less than 30 mg/g (HCC)    GFR 25 on 12/25/13  . CVA (cerebral infarction)   . Dementia    pt denies  . Depression 12/24/2013  . Diabetes mellitus type II    insulin dependent.   . Diabetic peripheral neuropathy ([place])   . Diabetic retinopathy   . Diverticulosis   . Duodenal ulcer 2009 and 11/2011   caused GI bleeding  . Gallstones 2009   seen on CT scan  . GI bleed 04/2011, 11/2011   found non-bleeding duodenal ulcers  . Hyperlipidemia   . Hypersomnolence 03/12/2013  . Hypertension   .  Iron deficiency anemia secondary to blood loss (chronic)   . Melena 1015/2015   PEPTIC ULCER    REQUIRING TRANSFUSION  . Pedal edema 10/12/2016  . PVD (peripheral vascular disease) ([place])   . Rib fracture 04/21/2015  . Thiamine deficiency 08/13/2015  . UTI (urinary tract infection) 04/08/2014  . Weight loss, non-intentional 01/29/2014    Assessment: 84 yo female currently on heparin infusion to warfarin bridge for acute DVT and afib with RVR. Pt with hx of CVA but MRI with no evidence of acute CVA. No known a/c PTA. CBC stable.   Heparin level remains therapeutic this am at 0.59 on heparin infusion at 850 units/hr. INR 1.13 after starting warfarin 3/9. Hgb 12.6, PLTc 169.    Goal of Therapy:  INR 2-3 Heparin level 0.3-0.7 units/ml Monitor platelets by anticoagulation protocol: Yes    Plan:  Continue heparin infusion at 850 units/hr Warfarin 4 mg x 1 this evening  Daily heparin level and INR  Vincenza Hews, PharmD, BCPS 05/23/2017, 11:27 AM

## 2017-05-23 NOTE — Evaluation (Signed)
Physical Therapy Evaluation Patient Details Name: Destiny [name] MRN: [id number] DOB: 10-16-1931 Today's Date: 05/23/2017   History of Present Illness  84 y.o. female with medical history significant of CAD s/p CABG, CVA in Nov.  Duodenal ulcer, last bleeding in 2014, DM2 on insulin, HTN. Patient went in to PCPs office today with c/o leg swelling, dizziness, and SOB. Patient found to have LEFT calf DVT on [place] today (not clear on chronicity of DVT given months of symptoms).  CT head negative but patient is noted to be ataxic on initial exam.  Clinical Impression  PTA pt lives alone with personal care aide 4/hr day/ 5xwk who assists with ADLs and iADLs, Pt able to ambulate in her house without AD but daughter-in-law reports she furniture walks. Pt currently limited in her mobility by decreased safety awareness and decreased balance. Pt is currently Mod I for bed mobility, min guard for transfers, minA for ambulation of 250 feet and min guard for ascent/descent of 3 steps with bilateral rails. Pt would benefit from HHPT at discharge to improve balance and safety awareness for safe mobility in her home environment. PT will continue to follow acutely until d/c.     Follow Up Recommendations Home health PT;Supervision/Assistance - 24 hour    Equipment Recommendations  None recommended by PT(has RW which she will not use)    Recommendations for Other Services       Precautions / Restrictions Precautions Precautions: Fall Restrictions Weight Bearing Restrictions: No      Mobility  Bed Mobility Overal bed mobility: Modified Independent             General bed mobility comments: HoB elevated, increased time  Transfers Overall transfer level: Needs assistance Equipment used: None Transfers: Sit to/from Stand Sit to Stand: Min guard         General transfer comment: min guard for safety, good power up and steadying  Ambulation/Gait Ambulation/Gait assistance: Min  assist Ambulation Distance (Feet): 250 Feet Assistive device: None Gait Pattern/deviations: Step-through pattern;Drifts right/left;Narrow base of support Gait velocity: WFL Gait velocity interpretation: at or above normal speed for age/gender General Gait Details: min guard progressing to minA for safety secondary to narrow BoS and ocassional scissor step. Pt adement about not using RW.   Stairs Stairs: Yes Stairs assistance: Min guard Stair Management: Two rails;Forwards;Alternating pattern Number of Stairs: 3 General stair comments: steady ascent/descent  Wheelchair Mobility    Modified Rankin (Stroke Patients Only)       Balance Overall balance assessment: History of Falls;Needs assistance Sitting-balance support: Feet unsupported;Feet supported;No upper extremity supported;Bilateral upper extremity supported;Single extremity supported Sitting balance-Leahy Scale: Good     Standing balance support: During functional activity;No upper extremity supported Standing balance-Leahy Scale: Good Standing balance comment: steady with static stance             High level balance activites: Head turns;Direction changes High Level Balance Comments: requires minA for steadying with looking R/L and with change in direction              Pertinent Vitals/Pain Pain Assessment: Faces Faces Pain Scale: Hurts little more Pain Location: bilateral knees with ambulation Pain Descriptors / Indicators: Grimacing;Discomfort Pain Intervention(s): Limited activity within patient's tolerance;Monitored during session;Repositioned    Home Living Family/patient expects to be discharged to:: Private residence Living Arrangements: Alone Available Help at Discharge: Family;Personal care attendant;Available 24 hours/day Type of Home: House Home Access: Stairs to enter Entrance Stairs-Rails: None Entrance Stairs-Number of Steps: 3 Home Layout:  One level Home Equipment: None      Prior  Function Level of Independence: Needs assistance   Gait / Transfers Assistance Needed: does not use AD, although  ADL's / Homemaking Assistance Needed: Aide helps with ADLs and iADLs  Comments: Home health aide M-F 4 hrs/day         Extremity/Trunk Assessment   Upper Extremity Assessment Upper Extremity Assessment: Overall WFL for tasks assessed    Lower Extremity Assessment Lower Extremity Assessment: Overall WFL for tasks assessed       Communication   Communication: Expressive difficulties(looks to daughter in law to respond to therapist questions)  Cognition Arousal/Alertness: Awake/alert Behavior During Therapy: WFL for tasks assessed/performed Overall Cognitive Status: History of cognitive impairments - at baseline                                 General Comments: confused with orientation and memory, daughter-in law states baseline level      General Comments General comments (skin integrity, edema, etc.): Son and daughter-in law present during session and provided history, and living      Assessment/Plan    PT Assessment Patient needs continued PT services  PT Problem List Decreased balance;Decreased safety awareness       PT Treatment Interventions DME instruction;Gait training;Stair training;Functional mobility training;Therapeutic activities;Therapeutic exercise;Balance training;Cognitive remediation;Patient/family education    PT Goals (Current goals can be found in the Care Plan section)  Acute Rehab PT Goals Patient Stated Goal: go home PT Goal Formulation: With patient/family Time For Goal Achievement: 06/06/17 Potential to Achieve Goals: Good    Frequency Min 3X/week   Barriers to discharge        Co-evaluation               AM-PAC PT "6 Clicks" Daily Activity  Outcome Measure Difficulty turning over in bed (including adjusting bedclothes, sheets and blankets)?: None Difficulty moving from lying on back to sitting on  the side of the bed? : A Little Difficulty sitting down on and standing up from a chair with arms (e.g., wheelchair, bedside commode, etc,.)?: A Little Help needed moving to and from a bed to chair (including a wheelchair)?: A Little Help needed walking in hospital room?: A Little Help needed climbing 3-5 steps with a railing? : A Little 6 Click Score: 19    End of Session Equipment Utilized During Treatment: Gait belt Activity Tolerance: Patient tolerated treatment well Patient left: with call bell/phone within reach;in chair;with chair alarm set;with family/visitor present Nurse Communication: Mobility status PT Visit Diagnosis: Unsteadiness on feet (R26.81);Other abnormalities of gait and mobility (R26.89);Difficulty in walking, not elsewhere classified (R26.2)    Time: 2194-7125 PT Time Calculation (min) (ACUTE ONLY): 34 min   Charges:   PT Evaluation $PT Eval Moderate Complexity: 1 Mod PT Treatments $Gait Training: 8-22 mins   PT G Codes:        Raveen Wieseler B. Migdalia Dk PT, DPT Acute Rehabilitation  [phone] Pager [phone]    [place] 05/23/2017, 1:06 PM

## 2017-05-23 NOTE — Care Management Obs Status (Signed)
[place] NOTIFICATION   Patient Details  Name: Destiny [name] MRN: [id number] Date of Birth: 01-09-1932   Medicare Observation Status Notification Given:  Yes    Claudie Leach, RN 05/23/2017, 3:53 PM

## 2017-05-24 DIAGNOSIS — K219 Gastro-esophageal reflux disease without esophagitis: Secondary | ICD-10-CM | POA: Diagnosis present

## 2017-05-24 DIAGNOSIS — D509 Iron deficiency anemia, unspecified: Secondary | ICD-10-CM | POA: Diagnosis present

## 2017-05-24 DIAGNOSIS — R0602 Shortness of breath: Secondary | ICD-10-CM | POA: Diagnosis present

## 2017-05-24 DIAGNOSIS — F329 Major depressive disorder, single episode, unspecified: Secondary | ICD-10-CM | POA: Diagnosis present

## 2017-05-24 DIAGNOSIS — I4891 Unspecified atrial fibrillation: Secondary | ICD-10-CM | POA: Diagnosis not present

## 2017-05-24 DIAGNOSIS — I82492 Acute embolism and thrombosis of other specified deep vein of left lower extremity: Secondary | ICD-10-CM | POA: Diagnosis not present

## 2017-05-24 DIAGNOSIS — E119 Type 2 diabetes mellitus without complications: Secondary | ICD-10-CM | POA: Diagnosis not present

## 2017-05-24 DIAGNOSIS — Z951 Presence of aortocoronary bypass graft: Secondary | ICD-10-CM | POA: Diagnosis not present

## 2017-05-24 DIAGNOSIS — E1122 Type 2 diabetes mellitus with diabetic chronic kidney disease: Secondary | ICD-10-CM | POA: Diagnosis present

## 2017-05-24 DIAGNOSIS — I2510 Atherosclerotic heart disease of native coronary artery without angina pectoris: Secondary | ICD-10-CM | POA: Diagnosis present

## 2017-05-24 DIAGNOSIS — I10 Essential (primary) hypertension: Secondary | ICD-10-CM | POA: Diagnosis not present

## 2017-05-24 DIAGNOSIS — R2681 Unsteadiness on feet: Secondary | ICD-10-CM | POA: Diagnosis present

## 2017-05-24 DIAGNOSIS — I129 Hypertensive chronic kidney disease with stage 1 through stage 4 chronic kidney disease, or unspecified chronic kidney disease: Secondary | ICD-10-CM | POA: Diagnosis present

## 2017-05-24 DIAGNOSIS — Z7902 Long term (current) use of antithrombotics/antiplatelets: Secondary | ICD-10-CM | POA: Diagnosis not present

## 2017-05-24 DIAGNOSIS — Z823 Family history of stroke: Secondary | ICD-10-CM | POA: Diagnosis not present

## 2017-05-24 DIAGNOSIS — Z8673 Personal history of transient ischemic attack (TIA), and cerebral infarction without residual deficits: Secondary | ICD-10-CM | POA: Diagnosis not present

## 2017-05-24 DIAGNOSIS — N184 Chronic kidney disease, stage 4 (severe): Secondary | ICD-10-CM | POA: Diagnosis not present

## 2017-05-24 DIAGNOSIS — E1142 Type 2 diabetes mellitus with diabetic polyneuropathy: Secondary | ICD-10-CM | POA: Diagnosis present

## 2017-05-24 DIAGNOSIS — I824Z2 Acute embolism and thrombosis of unspecified deep veins of left distal lower extremity: Secondary | ICD-10-CM | POA: Diagnosis present

## 2017-05-24 DIAGNOSIS — J449 Chronic obstructive pulmonary disease, unspecified: Secondary | ICD-10-CM | POA: Diagnosis present

## 2017-05-24 DIAGNOSIS — E785 Hyperlipidemia, unspecified: Secondary | ICD-10-CM | POA: Diagnosis present

## 2017-05-24 DIAGNOSIS — F0390 Unspecified dementia without behavioral disturbance: Secondary | ICD-10-CM | POA: Diagnosis present

## 2017-05-24 DIAGNOSIS — Z90710 Acquired absence of both cervix and uterus: Secondary | ICD-10-CM | POA: Diagnosis not present

## 2017-05-24 DIAGNOSIS — Z87891 Personal history of nicotine dependence: Secondary | ICD-10-CM | POA: Diagnosis not present

## 2017-05-24 DIAGNOSIS — R270 Ataxia, unspecified: Secondary | ICD-10-CM | POA: Diagnosis not present

## 2017-05-24 DIAGNOSIS — Z794 Long term (current) use of insulin: Secondary | ICD-10-CM | POA: Diagnosis not present

## 2017-05-24 DIAGNOSIS — Z79899 Other long term (current) drug therapy: Secondary | ICD-10-CM | POA: Diagnosis not present

## 2017-05-24 DIAGNOSIS — E1151 Type 2 diabetes mellitus with diabetic peripheral angiopathy without gangrene: Secondary | ICD-10-CM | POA: Diagnosis present

## 2017-05-24 LAB — BASIC METABOLIC PANEL
ANION GAP: 11 (ref 5–15)
BUN: 49 mg/dL — ABNORMAL HIGH (ref 6–20)
CO2: 21 mmol/L — ABNORMAL LOW (ref 22–32)
Calcium: 9.4 mg/dL (ref 8.9–10.3)
Chloride: 105 mmol/L (ref 101–111)
Creatinine, Ser: 2.19 mg/dL — ABNORMAL HIGH (ref 0.44–1.00)
GFR calc Af Amer: 23 mL/min — ABNORMAL LOW (ref >60)
GFR calc non Af Amer: 19 mL/min — ABNORMAL LOW (ref >60)
GLUCOSE: 189 mg/dL — AB (ref 65–99)
Potassium: 4.1 mmol/L (ref 3.5–5.1)
Sodium: 137 mmol/L (ref 135–145)

## 2017-05-24 LAB — GLUCOSE, CAPILLARY
Glucose-Capillary: 281 mg/dL — ABNORMAL HIGH (ref 65–99)
Glucose-Capillary: 209 mg/dL — ABNORMAL HIGH (ref 65–99)
Glucose-Capillary: 170 mg/dL — ABNORMAL HIGH (ref 65–99)
Glucose-Capillary: 166 mg/dL — ABNORMAL HIGH (ref 65–99)

## 2017-05-24 LAB — CBC
HCT: 35.2 % — ABNORMAL LOW (ref 36.0–46.0)
Hemoglobin: 11.8 g/dL — ABNORMAL LOW (ref 12.0–15.0)
MCH: 30.4 pg (ref 26.0–34.0)
MCHC: 33.5 g/dL (ref 30.0–36.0)
MCV: 90.7 fL (ref 78.0–100.0)
PLATELETS: 172 10*3/uL (ref 150–400)
RBC: 3.88 MIL/uL (ref 3.87–5.11)
RDW: 13.1 % (ref 11.5–15.5)
WBC: 8.4 10*3/uL (ref 4.0–10.5)

## 2017-05-24 LAB — HEPARIN LEVEL (UNFRACTIONATED)
HEPARIN UNFRACTIONATED: 0.85 [IU]/mL — AB (ref 0.30–0.70)
Heparin Unfractionated: 0.68 IU/mL (ref 0.30–0.70)

## 2017-05-24 LAB — PROTIME-INR
INR: 1.19
PROTHROMBIN TIME: 15 s (ref 11.4–15.2)

## 2017-05-24 MED ORDER — WARFARIN SODIUM 6 MG PO TABS
6.0000 mg | ORAL_TABLET | Freq: Once | ORAL | Status: AC
  Administered 2017-05-24: 6 mg via ORAL
  Filled 2017-05-24: qty 1

## 2017-05-24 NOTE — Progress Notes (Addendum)
PROGRESS NOTE  AI SONNENFELD QBH:[id number] DOB: Aug 04, 1931 DOA: 05/21/2017 PCP: Mosie Lukes, MD  HPI/Recap of past 24 hours:  Destiny [name] is a 84 y.o. year old female with medical history significant for history of CVA, dementia, CKD stage III, CAD status post CABG, hypertension, who presented on 05/21/2017 with left lower leg swelling at PCPs office and was found to have acute left DVT and new onset atrial fibrillation with RVR.    Interval History Doing well this morning.  Tolerated her breakfast.  Continues to deny any shortness of breath cough or chest pain.  No acute events overnight.  Assessment/Plan: Principal Problem:   Ataxia Active Problems:   Essential hypertension   Diabetes mellitus type 2, insulin dependent (HCC)   CKD (chronic kidney disease) stage 4, GFR 15-29 ml/min (HCC)   History of CVA (cerebrovascular accident)   Shortness of breath   DVT (deep venous thrombosis) (HCC)   Atrial fibrillation with RVR (HCC)   #Acute L DVT, stable TTE shows RVSP of 35, slight elevation unclear if related to potential PE Unable to obtain CTA given CKD, we will not pursue VQ scan since PE would not change current management with anticoagulation History of GI bleeds in the past (2015, 2009 (melena related to duodenal ulcers) but hemoglobin remained stable on daily CBC No family history of DVTs, patient-family reports sedentary at baseline - heparin gtt, monitor CBC -Needs hospitalization for heparin Bridge to warfarin therapy since CKD makes NOACs not an option -Increase warfarin to 6 mg tonight in hopes of increasing INR, daily INR, goal INR 2-3 -Incentive spirometry  #Shortness of breath, improved Findings from TTE did not definitively diagnose PE, limited given no ability to get CTA Patient currently normal oxygen saturation on room air Possible dyspnea related to atrial fibrillation with intermittent bouts of RVR as mentioned below -Continue to monitor resolved,  incentive spirometry  #Atrial Fibrillation, new onset, currently in normal sinus rhythm and rate controlled Intermittent bouts of RVR on admission but self converted, has not required beta-blockade CHADSVASC: 7HTN (1), age greater than 22 (2), TIA/stroke (2), female (1), diabetes (1) - continue to monitor on telemetry - If develops RVR--> as needed IV metoprolol - Heparin gtt bridge to warfarin, daily INR - NOACs not an option in light of CKD  #Unsteady gait, weakness MRI negative for any acute abnormalities, consistent with chronic ischemic changes Potentially related to acute left DVT and dyspnea associated with new onset atrial fibrillation.  B12 within normal limits PT evaluated and recommends rolling walker, patient not interested in using.  #CKD, stage IV, stable Baseline creatinine 1.9-2.3, remains at baseline -monitor on BMP -avoid nephrotoxins  #Type 2 diabetes A1c 8.4 (01/2017) -Lantus 25 units, correction coverage, NovoLog 3 units 3 times daily with meals  #Hypertension, not at goal 24 hr range 146-153 SBP, 09-73 diastolic Not far from goal, will continue to monitor without changes to blood pressure medication Continue home amlodipine, Maxzide  #COPD, stable Clear breath sounds on exam, doing well on room air -home Atrovent as needed, albuterol as needed, since telemetry today  #GERD, stable -Continue Zantac  #CAD status post CABG x4 (2010), stable No chest pain -Continue home Imdur 60 mg a.m., 30 mg p.m. - Hold home Plavix in light of current heparin drip and history of GI bleeds  #Hyperlipidemia -Continue home atorvastatin  #Depression, stable -Continue Celexa  #Iron deficiency anemia, chronic, stable -Monitoring CBC while on anticoagulation, continue home oral iron  #Mild dementia -Has  failed prior trials of Aricept and Namenda  Code Status: Full code  Family Communication: Spoke with son Mr. Troyer on phone today.  Of note, family requests  patient's INR be followed outpatient by her cardiologist Dr. Johnsie Cancel once ready for discharge  Disposition Plan: Monitor CBC while on heparin drip, warfarin awaiting INR 2-3, continue to monitor heart rate and breathing status with new onset atrial fibrillation-albeit now in normal sinus rhythm and rate control   Consultants:  None  Procedures:  TTE on 05/22/17: EF 50-55%.  No wall motion normalities.  Pulmonary artery systolic pressure 35  Antimicrobials: None Cultures: None   Telemetry: Yes  DVT prophylaxis: heparin drip   Objective: Vitals:   05/24/17 0000 05/24/17 0400 05/24/17 0819 05/24/17 1127  BP: 134/63 (!) 157/69 (!) 146/54 (!) 153/64  Pulse: 65 65 (!) 50 66  Resp: 20 18 16 16   Temp: 98.5 F (36.9 C) 97.6 F (36.4 C) 98 F (36.7 C) 97.9 F (36.6 C)  TempSrc: Oral Oral Oral Oral  SpO2: 96% 98% 98% 95%  Weight:      Height:        Intake/Output Summary (Last 24 hours) at 05/24/2017 1258 Last data filed at 05/24/2017 1219 Gross per 24 hour  Intake 480 ml  Output -  Net 480 ml   Filed Weights   05/21/17 2026 05/22/17 0020  Weight: 74 kg (163 lb 2.3 oz) 74.5 kg (164 lb 3.9 oz)    Exam:  General: Comfortably lying in bed Eyes: EOMI, anicteric ENT: Oral Mucosa clear and moist Cardiovascular: regular rate and rhythm, no murmurs, rubs or gallops, no appreciable edema Respiratory: Normal respiratory effort on room air, normal breath sounds,  Abdomen: soft, non-distended, non-tender, normal bowel sounds Skin: No Rash Musculoskeletal:Good ROM, no contractures. Normal muscle tone Neurologic: Grossly no focal neuro deficit.Mental status alert and oriented to person, place, month, and self Psychiatric:Appropriate affect, and mood  Data Reviewed: CBC: Recent Labs  Lab 05/21/17 1430 05/22/17 0551 05/23/17 0729 05/24/17 0727  WBC 8.2 7.6 8.5 8.4  NEUTROABS 5.0  --   --   --   HGB 12.6 11.8* 12.6 11.8*  HCT 37.3 34.3* 37.2 35.2*  MCV 92.4 91.7 91.4 90.7   PLT 198.0 163 169 540   Basic Metabolic Panel: Recent Labs  Lab 05/21/17 1430 05/22/17 2231 05/24/17 0727  NA 137 135 137  K 4.0 4.1 4.1  CL 102 101 105  CO2 27 22 21*  GLUCOSE 270* 198* 189*  BUN 38* 45* 49*  CREATININE 1.81* 2.26* 2.19*  CALCIUM 9.7 9.4 9.4   GFR: Estimated Creatinine Clearance: 19.7 mL/min (A) (by C-G formula based on SCr of 2.19 mg/dL (H)). Liver Function Tests: Recent Labs  Lab 05/21/17 1430  AST 13  ALT 15  ALKPHOS 72  BILITOT 0.9  PROT 6.8  ALBUMIN 3.8   No results for input(s): LIPASE, AMYLASE in the last 168 hours. No results for input(s): AMMONIA in the last 168 hours. Coagulation Profile: Recent Labs  Lab 05/22/17 2231 05/23/17 0729 05/24/17 0727  INR 1.14 1.13 1.19   Cardiac Enzymes: Recent Labs  Lab 05/22/17 0111 05/22/17 0551 05/22/17 1210  TROPONINI <0.03 <0.03 <0.03   BNP (last 3 results) Recent Labs    05/21/17 1430  PROBNP 210.0*   HbA1C: No results for input(s): HGBA1C in the last 72 hours. CBG: Recent Labs  Lab 05/23/17 1128 05/23/17 1637 05/23/17 2205 05/24/17 0636 05/24/17 1121  GLUCAP 202* 275* 293* 281* 209*  Lipid Profile: No results for input(s): CHOL, HDL, LDLCALC, TRIG, CHOLHDL, LDLDIRECT in the last 72 hours. Thyroid Function Tests: No results for input(s): TSH, T4TOTAL, FREET4, T3FREE, THYROIDAB in the last 72 hours. Anemia Panel: Recent Labs    05/22/17 2231  VITAMINB12 312   Urine analysis:    Component Value Date/Time   COLORURINE YELLOW 02/02/2017 1939   APPEARANCEUR CLOUDY (A) 02/02/2017 1939   LABSPEC 1.013 02/02/2017 1939   PHURINE 5.0 02/02/2017 1939   GLUCOSEU NEGATIVE 02/02/2017 1939   GLUCOSEU 100 (A) 01/26/2017 1114   HGBUR SMALL (A) 02/02/2017 1939   HGBUR large 04/26/2009 1110   BILIRUBINUR Negative 05/21/2017 [place] 02/02/2017 1939   PROTEINUR 1+ 05/21/2017 1512   PROTEINUR 100 (A) 02/02/2017 1939   UROBILINOGEN negative (A) 05/21/2017 1512    UROBILINOGEN 0.2 01/26/2017 1114   NITRITE Negative 05/21/2017 1512   NITRITE NEGATIVE 02/02/2017 1939   LEUKOCYTESUR Negative 05/21/2017 1512   Sepsis Labs: @LABRCNTIP (procalcitonin:4,lacticidven:4)  ) Recent Results (from the past 240 hour(s))  Urine Culture     Status: None   Collection Time: 05/21/17  3:55 PM  Result Value Ref Range Status   MICRO NUMBER: [id number]  Final   SPECIMEN QUALITY: ADEQUATE  Final   Sample Source URINE  Final   STATUS: FINAL  Final   Result: No Growth  Final      Studies: No results found.  Scheduled Meds: . amLODipine  5 mg Oral Daily  . atorvastatin  20 mg Oral Daily  . cholecalciferol  1,000 Units Oral QPM  . citalopram  20 mg Oral Daily  . ferrous sulfate  325 mg Oral Q breakfast  . insulin aspart  0-9 Units Subcutaneous TID WC  . insulin aspart  3 Units Subcutaneous TID WC  . insulin glargine  25 Units Subcutaneous Daily  . isosorbide mononitrate  30 mg Oral QHS  . isosorbide mononitrate  60 mg Oral Daily  . pantoprazole  40 mg Oral BID  . thiamine  100 mg Oral Q M,W,F  . triamterene-hydrochlorothiazide  0.5 tablet Oral Daily  . warfarin  6 mg Oral ONCE-1800  . Warfarin - Pharmacist Dosing Inpatient   Does not apply q1800    Continuous Infusions: . heparin 700 Units/hr (05/24/17 1257)     LOS: 0 days     Desiree Hane, MD [facility] Pager [phone]  If 7PM-7AM, please contact night-coverage [email or web address] Password [facility] 05/24/2017, 12:58 PM

## 2017-05-24 NOTE — Progress Notes (Signed)
Inpatient Diabetes Program Recommendations  AACE/ADA: New Consensus Statement on Inpatient Glycemic Control (2015)  Target Ranges:  Prepandial:   less than 140 mg/dL      Peak postprandial:   less than 180 mg/dL (1-2 hours)      Critically ill patients:  140 - 180 mg/dL   Lab Results  Component Value Date   GLUCAP 209 (H) 05/24/2017   HGBA1C 8.4 (H) 02/02/2017    Review of Glycemic Control Results for Destiny [name], Destiny [name] (MRN [id number]) as of 05/24/2017 12:37  Ref. Range 05/23/2017 16:37 05/23/2017 22:05 05/24/2017 06:36 05/24/2017 11:21  Glucose-Capillary Latest Ref Range: 65 - 99 mg/dL 275 (H) 293 (H) 281 (H) 209 (H)   Diabetes history: Type 2 DM Outpatient Diabetes medications: Lantus 35 Units in AM, Humalog 5-10 Units TID Current orders for Inpatient glycemic control: Lantus 25 Units QD, Novolog 0-9 Units TID, Novolog 3 units TID  Inpatient Diabetes Program Recommendations:    Consider increasing Lantus to 30 units QD and adding Novolog 0-5 Units QHS.  Thanks, Bronson Curb, MSN, RNC-OB Diabetes Coordinator [phone] (8a-5p)

## 2017-05-24 NOTE — Progress Notes (Addendum)
ANTICOAGULATION CONSULT NOTE  Pharmacy Consult for Heparin and Warfarin  Indication: Acute DVT and Atrial fibrillation  No Known Allergies  Patient Measurements: Height: 5\' 6"  (167.6 cm) Weight: 164 lb 3.9 oz (74.5 kg) IBW/kg (Calculated) : 59.3  Vital Signs: Temp: 97.9 F (36.6 C) (03/11 1127) Temp Source: Oral (03/11 1127) BP: 153/64 (03/11 1127) Pulse Rate: 66 (03/11 1127)  Labs: Recent Labs    05/21/17 1430 05/22/17 0111  05/22/17 0551 05/22/17 1210 05/22/17 2231 05/23/17 0729 05/24/17 0727 05/24/17 1405  HGB 12.6  --   --  11.8*  --   --  12.6 11.8*  --   HCT 37.3  --   --  34.3*  --   --  37.2 35.2*  --   PLT 198.0  --   --  163  --   --  169 172  --   LABPROT  --   --   --   --   --  14.5 14.4 15.0  --   INR  --   --   --   --   --  1.14 1.13 1.19  --   HEPARINUNFRC  --   --    < > 1.08*  --  0.69 0.59 0.85* 0.68  CREATININE 1.81*  --   --   --   --  2.26*  --  2.19*  --   TROPONINI  --  <0.03  --  <0.03 <0.03  --   --   --   --    < > = values in this interval not displayed.     Medical History: Past Medical History:  Diagnosis Date  . Acute renal insufficiency 12/24/2013  . Amaurosis fugax   . Arthritis 12/06/2012  . Atherosclerosis   . CAD (coronary artery disease)    a. s/p CABG x 4 (LIMA->LAD, VG->Diag, VG->OM1->OM3);  b. 2010 Cath: 3/4 patent grafts (VG->diag occluded), 3vd, sev PVD ->Med Rx;  c. 12/2011 Lexi MV: sm area of mild mid and apical ant wall ischemia ->med rx.  . Carotid bruit   . Chronic kidney disease (CKD) stage G4/A1, severely decreased glomerular filtration rate (GFR) between 15-29 mL/min/1.73 square meter and albuminuria creatinine ratio less than 30 mg/g (HCC)    GFR 25 on 12/25/13  . CVA (cerebral infarction)   . Dementia    pt denies  . Depression 12/24/2013  . Diabetes mellitus type II    insulin dependent.   . Diabetic peripheral neuropathy ([place])   . Diabetic retinopathy   . Diverticulosis   . Duodenal ulcer 2009 and  11/2011   caused GI bleeding  . Gallstones 2009   seen on CT scan  . GI bleed 04/2011, 11/2011   found non-bleeding duodenal ulcers  . Hyperlipidemia   . Hypersomnolence 03/12/2013  . Hypertension   . Iron deficiency anemia secondary to blood loss (chronic)   . Melena 1015/2015   PEPTIC ULCER    REQUIRING TRANSFUSION  . Pedal edema 10/12/2016  . PVD (peripheral vascular disease) ([place])   . Rib fracture 04/21/2015  . Thiamine deficiency 08/13/2015  . UTI (urinary tract infection) 04/08/2014  . Weight loss, non-intentional 01/29/2014    Assessment: 84 yo female currently on heparin infusion to warfarin bridge for acute DVT and afib with RVR. Pt with hx of CVA but MRI with no evidence of acute CVA. No known a/c PTA. CBC stable.   Repeat Heparin level is therapeutic at 0.68 (decreased after reduction to 700  units/hr). CBC stable. No bleeding reported.    Goal of Therapy:  INR 2-3 Heparin level 0.3-0.7 units/ml Monitor platelets by anticoagulation protocol: Yes    Plan:  Continue heparin infusion to 700 units/hr Continue Warfarin 6 mg x 1 this evening  Daily heparin level, CBC, and INR  Sloan Leiter, PharmD, BCPS, BCCCP Clinical Pharmacist Clinical phone 05/24/2017 until 3:30PM - #[phone] After hours, please call [phone] 05/24/2017, 2:43 PM

## 2017-05-24 NOTE — Progress Notes (Signed)
ANTICOAGULATION CONSULT NOTE  Pharmacy Consult for Heparin and Warfarin  Indication: Acute DVT and Atrial fibrillation  No Known Allergies  Patient Measurements: Height: 5\' 6"  (167.6 cm) Weight: 164 lb 3.9 oz (74.5 kg) IBW/kg (Calculated) : 59.3  Vital Signs: Temp: 98 F (36.7 C) (03/11 0819) Temp Source: Oral (03/11 0819) BP: 146/54 (03/11 0819) Pulse Rate: 50 (03/11 0819)  Labs: Recent Labs    05/21/17 1430 05/22/17 0111  05/22/17 0551 05/22/17 1210 05/22/17 2231 05/23/17 0729 05/24/17 0727  HGB 12.6  --   --  11.8*  --   --  12.6 11.8*  HCT 37.3  --   --  34.3*  --   --  37.2 35.2*  PLT 198.0  --   --  163  --   --  169 172  LABPROT  --   --   --   --   --  14.5 14.4 15.0  INR  --   --   --   --   --  1.14 1.13 1.19  HEPARINUNFRC  --   --    < > 1.08*  --  0.69 0.59 0.85*  CREATININE 1.81*  --   --   --   --  2.26*  --  2.19*  TROPONINI  --  <0.03  --  <0.03 <0.03  --   --   --    < > = values in this interval not displayed.     Medical History: Past Medical History:  Diagnosis Date  . Acute renal insufficiency 12/24/2013  . Amaurosis fugax   . Arthritis 12/06/2012  . Atherosclerosis   . CAD (coronary artery disease)    a. s/p CABG x 4 (LIMA->LAD, VG->Diag, VG->OM1->OM3);  b. 2010 Cath: 3/4 patent grafts (VG->diag occluded), 3vd, sev PVD ->Med Rx;  c. 12/2011 Lexi MV: sm area of mild mid and apical ant wall ischemia ->med rx.  . Carotid bruit   . Chronic kidney disease (CKD) stage G4/A1, severely decreased glomerular filtration rate (GFR) between 15-29 mL/min/1.73 square meter and albuminuria creatinine ratio less than 30 mg/g (HCC)    GFR 25 on 12/25/13  . CVA (cerebral infarction)   . Dementia    pt denies  . Depression 12/24/2013  . Diabetes mellitus type II    insulin dependent.   . Diabetic peripheral neuropathy ([place])   . Diabetic retinopathy   . Diverticulosis   . Duodenal ulcer 2009 and 11/2011   caused GI bleeding  . Gallstones 2009   seen on  CT scan  . GI bleed 04/2011, 11/2011   found non-bleeding duodenal ulcers  . Hyperlipidemia   . Hypersomnolence 03/12/2013  . Hypertension   . Iron deficiency anemia secondary to blood loss (chronic)   . Melena 1015/2015   PEPTIC ULCER    REQUIRING TRANSFUSION  . Pedal edema 10/12/2016  . PVD (peripheral vascular disease) ([place])   . Rib fracture 04/21/2015  . Thiamine deficiency 08/13/2015  . UTI (urinary tract infection) 04/08/2014  . Weight loss, non-intentional 01/29/2014    Assessment: 84 yo female currently on heparin infusion to warfarin bridge for acute DVT and afib with RVR. Pt with hx of CVA but MRI with no evidence of acute CVA. No known a/c PTA. CBC stable.   Heparin level is SUPRA-therapeutic this AM at 0.85 on 850 units/hr. Confirmed upon viewing patient that lab was drawn from opposite arm.  INR 1.19 after starting warfarin 3/9. Hgb and platelets are stable.  Goal of Therapy:  INR 2-3 Heparin level 0.3-0.7 units/ml Monitor platelets by anticoagulation protocol: Yes    Plan:  Reduce heparin infusion to 700 units/hr Repeat level in 6 hours Warfarin 6 mg x 1 this evening  Daily heparin level and INR  Sloan Leiter, PharmD, BCPS, BCCCP Clinical Pharmacist Clinical phone 05/24/2017 until 3:30PM - #[phone] After hours, please call [phone] 05/24/2017, 8:26 AM

## 2017-05-25 DIAGNOSIS — I4891 Unspecified atrial fibrillation: Secondary | ICD-10-CM

## 2017-05-25 LAB — GLUCOSE, CAPILLARY
GLUCOSE-CAPILLARY: 146 mg/dL — AB (ref 65–99)
Glucose-Capillary: 188 mg/dL — ABNORMAL HIGH (ref 65–99)
Glucose-Capillary: 268 mg/dL — ABNORMAL HIGH (ref 65–99)
Glucose-Capillary: 269 mg/dL — ABNORMAL HIGH (ref 65–99)

## 2017-05-25 LAB — CBC
HCT: 34.6 % — ABNORMAL LOW (ref 36.0–46.0)
Hemoglobin: 11.6 g/dL — ABNORMAL LOW (ref 12.0–15.0)
MCH: 30.7 pg (ref 26.0–34.0)
MCHC: 33.5 g/dL (ref 30.0–36.0)
MCV: 91.5 fL (ref 78.0–100.0)
PLATELETS: 179 10*3/uL (ref 150–400)
RBC: 3.78 MIL/uL — AB (ref 3.87–5.11)
RDW: 13.3 % (ref 11.5–15.5)
WBC: 9.1 10*3/uL (ref 4.0–10.5)

## 2017-05-25 LAB — HEPARIN LEVEL (UNFRACTIONATED): HEPARIN UNFRACTIONATED: 0.67 [IU]/mL (ref 0.30–0.70)

## 2017-05-25 LAB — PROTIME-INR
INR: 1.35
PROTHROMBIN TIME: 16.6 s — AB (ref 11.4–15.2)

## 2017-05-25 MED ORDER — METOPROLOL TARTRATE 12.5 MG HALF TABLET
12.5000 mg | ORAL_TABLET | Freq: Two times a day (BID) | ORAL | Status: DC
Start: 2017-05-25 — End: 2017-05-27
  Administered 2017-05-25 – 2017-05-27 (×4): 12.5 mg via ORAL
  Filled 2017-05-25 (×4): qty 1

## 2017-05-25 MED ORDER — WARFARIN SODIUM 6 MG PO TABS
6.0000 mg | ORAL_TABLET | Freq: Once | ORAL | Status: AC
  Administered 2017-05-25: 6 mg via ORAL
  Filled 2017-05-25: qty 1

## 2017-05-25 NOTE — Progress Notes (Signed)
Physical Therapy Treatment Patient Details Name: Destiny [name] MRN: [id number] DOB: 1931/08/31 Today's Date: 05/25/2017    History of Present Illness 84 y.o. female with medical history significant of CAD s/p CABG, CVA in Nov.  Duodenal ulcer, last bleeding in 2014, DM2 on insulin, HTN. Patient went in to PCPs office today with c/o leg swelling, dizziness, and SOB. Patient found to have LEFT calf DVT on [place] today (not clear on chronicity of DVT given months of symptoms).  CT head negative but patient is noted to be ataxic on initial exam.    PT Comments    Pt requires additional assist with mobility and becomes SoB with activity today. Pt is currently minA for transfers and ambulation of 20 feet without AD. Pt continues to refuse use of AD, however had increased instability causing her to use IV pole and any available furniture to steady herself in getting from bed to bathroom and bathroom to recliner. Pt with 3/4 DoE ambulation from bathroom to recliner. PT will reassess discharge recommendations at next treatment session.      Follow Up Recommendations  Home health PT;Supervision/Assistance - 24 hour     Equipment Recommendations  None recommended by PT(has RW which she will not use)    Recommendations for Other Services       Precautions / Restrictions Precautions Precautions: Fall Restrictions Weight Bearing Restrictions: No    Mobility  Bed Mobility               General bed mobility comments: seated EoB on entry wanting to go to bathroom  Transfers Overall transfer level: Needs assistance Equipment used: None Transfers: Sit to/from Stand Sit to Stand: Min assist         General transfer comment: minA for steadying in standing from bed and toilet,   Ambulation/Gait Ambulation/Gait assistance: Min assist Ambulation Distance (Feet): 20 Feet Assistive device: None Gait Pattern/deviations: Step-through pattern;Drifts right/left;Narrow base of support Gait  velocity: slowed Gait velocity interpretation: Below normal speed for age/gender General Gait Details: minA for steadying, pt refuses RW but hold on to IV pole and reaches out to furniture and doorway to steady herself         Balance Overall balance assessment: History of Falls;Needs assistance Sitting-balance support: Feet unsupported;Feet supported;No upper extremity supported;Bilateral upper extremity supported;Single extremity supported Sitting balance-Leahy Scale: Good     Standing balance support: During functional activity;No upper extremity supported Standing balance-Leahy Scale: Good Standing balance comment: steady with static stance               High Level Balance Comments: requires minA for steadying with looking R/L and with change in direction             Cognition Arousal/Alertness: Awake/alert Behavior During Therapy: WFL for tasks assessed/performed Overall Cognitive Status: History of cognitive impairments - at baseline                                 General Comments: confused with orientation and memory, daughter-in law states baseline level         General Comments General comments (skin integrity, edema, etc.): Pt in jeans with gown overtop on entry and was unable to make it bathroom before wetting herself. Pt upset with having to wear gown.       Pertinent Vitals/Pain Pain Assessment: No/denies pain  3/4 DoE with ambulation SaO2 on RA 97% O2  PT Goals (current goals can now be found in the care plan section) Acute Rehab PT Goals Patient Stated Goal: go home PT Goal Formulation: With patient/family Time For Goal Achievement: 06/06/17 Potential to Achieve Goals: Good Progress towards PT goals: PT to reassess next treatment    Frequency    Min 3X/week      PT Plan Current plan remains appropriate       AM-PAC PT "6 Clicks" Daily Activity  Outcome Measure  Difficulty turning over in bed (including  adjusting bedclothes, sheets and blankets)?: None Difficulty moving from lying on back to sitting on the side of the bed? : A Little Difficulty sitting down on and standing up from a chair with arms (e.g., wheelchair, bedside commode, etc,.)?: Unable Help needed moving to and from a bed to chair (including a wheelchair)?: A Little Help needed walking in hospital room?: A Little Help needed climbing 3-5 steps with a railing? : A Little 6 Click Score: 17    End of Session Equipment Utilized During Treatment: Gait belt Activity Tolerance: Patient tolerated treatment well Patient left: with call bell/phone within reach;in chair;with chair alarm set;with family/visitor present Nurse Communication: Mobility status PT Visit Diagnosis: Unsteadiness on feet (R26.81);Other abnormalities of gait and mobility (R26.89);Difficulty in walking, not elsewhere classified (R26.2)     Time: 2706-2376 PT Time Calculation (min) (ACUTE ONLY): 16 min  Charges:  $Therapeutic Activity: 8-22 mins                    G Codes:       Pernella Ackerley B. Migdalia Dk PT, DPT Acute Rehabilitation  [phone] Pager [phone]     [place] 05/25/2017, 4:48 PM

## 2017-05-25 NOTE — Progress Notes (Signed)
ANTICOAGULATION CONSULT NOTE  Pharmacy Consult for Heparin and Warfarin  Indication: Acute DVT and Atrial fibrillation  No Known Allergies  Patient Measurements: Height: 5\' 6"  (167.6 cm) Weight: 164 lb 3.9 oz (74.5 kg) IBW/kg (Calculated) : 59.3  Vital Signs: Temp: 98.2 F (36.8 C) (03/12 0828) Temp Source: Oral (03/12 0828) BP: 144/77 (03/12 0828) Pulse Rate: 89 (03/12 0828)  Labs: Recent Labs    05/22/17 1210  05/22/17 2231  05/23/17 0729 05/24/17 0727 05/24/17 1405 05/25/17 0400  HGB  --   --   --    < > 12.6 11.8*  --  11.6*  HCT  --   --   --   --  37.2 35.2*  --  34.6*  PLT  --   --   --   --  169 172  --  179  LABPROT  --    < > 14.5  --  14.4 15.0  --  16.6*  INR  --    < > 1.14  --  1.13 1.19  --  1.35  HEPARINUNFRC  --    < > 0.69  --  0.59 0.85* 0.68 0.67  CREATININE  --   --  2.26*  --   --  2.19*  --   --   TROPONINI <0.03  --   --   --   --   --   --   --    < > = values in this interval not displayed.     Medical History: Past Medical History:  Diagnosis Date  . Acute renal insufficiency 12/24/2013  . Amaurosis fugax   . Arthritis 12/06/2012  . Atherosclerosis   . CAD (coronary artery disease)    a. s/p CABG x 4 (LIMA->LAD, VG->Diag, VG->OM1->OM3);  b. 2010 Cath: 3/4 patent grafts (VG->diag occluded), 3vd, sev PVD ->Med Rx;  c. 12/2011 Lexi MV: sm area of mild mid and apical ant wall ischemia ->med rx.  . Carotid bruit   . Chronic kidney disease (CKD) stage G4/A1, severely decreased glomerular filtration rate (GFR) between 15-29 mL/min/1.73 square meter and albuminuria creatinine ratio less than 30 mg/g (HCC)    GFR 25 on 12/25/13  . CVA (cerebral infarction)   . Dementia    pt denies  . Depression 12/24/2013  . Diabetes mellitus type II    insulin dependent.   . Diabetic peripheral neuropathy ([place])   . Diabetic retinopathy   . Diverticulosis   . Duodenal ulcer 2009 and 11/2011   caused GI bleeding  . Gallstones 2009   seen on CT scan  .  GI bleed 04/2011, 11/2011   found non-bleeding duodenal ulcers  . Hyperlipidemia   . Hypersomnolence 03/12/2013  . Hypertension   . Iron deficiency anemia secondary to blood loss (chronic)   . Melena 1015/2015   PEPTIC ULCER    REQUIRING TRANSFUSION  . Pedal edema 10/12/2016  . PVD (peripheral vascular disease) ([place])   . Rib fracture 04/21/2015  . Thiamine deficiency 08/13/2015  . UTI (urinary tract infection) 04/08/2014  . Weight loss, non-intentional 01/29/2014    Assessment: 84 yo female currently on heparin infusion to warfarin bridge for acute DVT and afib with RVR. Pt with hx of CVA but MRI with no evidence of acute CVA. No known a/c PTA. CBC stable. Day 4 overlap -heparin level= 0.67, INR= 1.35 (coumadin increased 3/11)    Goal of Therapy:  INR 2-3 Heparin level 0.3-0.7 units/ml Monitor platelets by anticoagulation protocol: Yes  Plan:  Continue heparin infusion to 700 units/hr Continue Warfarin 6 mg x 1 this evening  Daily heparin level, CBC, and INR  Hildred Laser, PharmD Clinical Pharmacist Clinical phone from 8:30-4:00 is [phone] After 4pm, please call Main Rx [phone]) for assistance. 05/25/2017 10:54 AM

## 2017-05-25 NOTE — Progress Notes (Signed)
PROGRESS NOTE  Destiny [name] NWG:[id number] DOB: 04/07/1931 DOA: 05/21/2017 PCP: Mosie Lukes, MD  HPI/Recap of past 24 hours:  Destiny [name] is a 84 y.o. year old female with medical history significant for history of CVA, dementia, CKD stage III, CAD status post CABG, hypertension, who presented on 05/21/2017 with left lower leg swelling at PCPs office and was found to have acute left DVT and new onset atrial fibrillation with RVR.    Interval History Doing well this morning.  Working with her aid to walk around room. No acute complaints.   Assessment/Plan: Principal Problem:   Ataxia Active Problems:   Essential hypertension   Diabetes mellitus type 2, insulin dependent (HCC)   CKD (chronic kidney disease) stage 4, GFR 15-29 ml/min (HCC)   History of CVA (cerebrovascular accident)   Shortness of breath   DVT (deep venous thrombosis) (HCC)   Atrial fibrillation with RVR (HCC)   #Acute L DVT, stable TTE shows RVSP of 35, slight elevation unclear if related to potential PE Unable to obtain CTA given CKD, we will not pursue VQ scan since PE would not change current management with anticoagulation History of GI bleeds in the past (2015, 2009 (melena related to duodenal ulcers) but hemoglobin remains stable on daily CBC No family history of DVTs, patient-family reports sedentary at baseline - heparin gtt, monitor CBC -Heparin Bridge to warfarin therapy since CKD makes NOACs not an option -Increase warfarin to 6 mg tonight in hopes of increasing INR, daily INR, goal INR 2-3 -Incentive spirometry  #Shortness of breath, resolved Findings from TTE did not definitively diagnose PE, limited given no ability to get CTA Patient currently normal oxygen saturation on room air Possible dyspnea related to atrial fibrillation with intermittent bouts of RVR as mentioned below -Continue to monitor resolved, incentive spirometry  #Atrial Fibrillation, new onset, currently in normal sinus  rhythm and rate controlled Intermittent bouts of RVR nonsustained CHADSVASC: 7HTN (1), age greater than 67 (2), TIA/stroke (2), female (1), diabetes (1) - continue to monitor on telemetry - start metoprolol 12.5mg  BID, monitor - Heparin gtt bridge to warfarin, daily INR - NOACs not an option in light of CKD  #Unsteady gait, weakness MRI negative for any acute abnormalities, consistent with chronic ischemic changes Potentially related to acute left DVT and dyspnea associated with new onset atrial fibrillation.  B12 within normal limits PT evaluated and recommends rolling walker, patient not interested in using. Lives alone, granddaughter lives behind her, has personal aide family working on 24 hour assistance  #CKD, stage IV, stable Baseline creatinine 1.9-2.3, remains at baseline -monitor on BMP -avoid nephrotoxins  #Type 2 diabetes A1c 8.4 (01/2017) -Lantus 25 units, correction coverage, NovoLog 3 units 3 times daily with meals  #Hypertension, not at goal 24 hr range 146-153 SBP, 57-84 diastolic Not far from goal, will continue to monitor without changes to blood pressure medication Continue home amlodipine, Maxzide  #COPD, stable Clear breath sounds on exam, doing well on room air -home Atrovent as needed, albuterol as needed, since telemetry today  #GERD, stable -Continue Zantac  #CAD status post CABG x4 (2010), stable No chest pain -Continue home Imdur 60 mg a.m., 30 mg p.m. - Hold home Plavix in light of current heparin drip and history of GI bleeds  #Hyperlipidemia -Continue home atorvastatin  #Depression, stable -Continue Celexa  #Iron deficiency anemia, chronic, stable -Monitoring CBC while on anticoagulation, continue home oral iron  #Mild dementia -Has failed prior trials of Aricept and  Namenda  Code Status: Full code  Family Communication: Spoke with son Mr. Macwilliams on phone today.  Of note, family requests patient's INR be followed outpatient by her  cardiologist Dr. Johnsie Cancel once ready for discharge  Disposition Plan: Monitor CBC while on heparin drip, warfarin awaiting INR 2-3, continue to monitor heart rate and breathing status with new onset atrial fibrillation-albeit now in normal sinus rhythm and rate control   Consultants:  None  Procedures:  TTE on 05/22/17: EF 50-55%.  No wall motion normalities.  Pulmonary artery systolic pressure 35  Antimicrobials: None Cultures: None   Telemetry: Yes  DVT prophylaxis: heparin drip   Objective: Vitals:   05/25/17 0828 05/25/17 1224 05/25/17 1642 05/25/17 2030  BP: (!) 144/77 (!) 153/83 (!) 173/69 (!) 172/70  Pulse: 89 (!) 129 66 64  Resp: 18 18 18 18   Temp: 98.2 F (36.8 C) 97.9 F (36.6 C) 98.3 F (36.8 C) 99.1 F (37.3 C)  TempSrc: Oral Oral Oral Oral  SpO2: 96% 94% 97% 98%  Weight:      Height:       No intake or output data in the 24 hours ending 05/25/17 2142 Filed Weights   05/21/17 2026 05/22/17 0020  Weight: 74 kg (163 lb 2.3 oz) 74.5 kg (164 lb 3.9 oz)    Exam:  General: Comfortably lying in bed Eyes: EOMI, anicteric ENT: Oral Mucosa clear and moist Cardiovascular: regular rate and rhythm, no murmurs, rubs or gallops, no appreciable edema Respiratory: Normal respiratory effort on room air, normal breath sounds,  Abdomen: soft, non-distended, non-tender, normal bowel sounds Skin: No Rash Musculoskeletal:Good ROM, no contractures. Normal muscle tone Neurologic: Grossly no focal neuro deficit.Mental status alert and oriented to person, place, month, and self Psychiatric:Appropriate affect, and mood  Data Reviewed: CBC: Recent Labs  Lab 05/21/17 1430 05/22/17 0551 05/23/17 0729 05/24/17 0727 05/25/17 0400  WBC 8.2 7.6 8.5 8.4 9.1  NEUTROABS 5.0  --   --   --   --   HGB 12.6 11.8* 12.6 11.8* 11.6*  HCT 37.3 34.3* 37.2 35.2* 34.6*  MCV 92.4 91.7 91.4 90.7 91.5  PLT 198.0 163 169 172 626   Basic Metabolic Panel: Recent Labs  Lab 05/21/17 1430  05/22/17 2231 05/24/17 0727  NA 137 135 137  K 4.0 4.1 4.1  CL 102 101 105  CO2 27 22 21*  GLUCOSE 270* 198* 189*  BUN 38* 45* 49*  CREATININE 1.81* 2.26* 2.19*  CALCIUM 9.7 9.4 9.4   GFR: Estimated Creatinine Clearance: 19.7 mL/min (A) (by C-G formula based on SCr of 2.19 mg/dL (H)). Liver Function Tests: Recent Labs  Lab 05/21/17 1430  AST 13  ALT 15  ALKPHOS 72  BILITOT 0.9  PROT 6.8  ALBUMIN 3.8   No results for input(s): LIPASE, AMYLASE in the last 168 hours. No results for input(s): AMMONIA in the last 168 hours. Coagulation Profile: Recent Labs  Lab 05/22/17 2231 05/23/17 0729 05/24/17 0727 05/25/17 0400  INR 1.14 1.13 1.19 1.35   Cardiac Enzymes: Recent Labs  Lab 05/22/17 0111 05/22/17 0551 05/22/17 1210  TROPONINI <0.03 <0.03 <0.03   BNP (last 3 results) Recent Labs    05/21/17 1430  PROBNP 210.0*   HbA1C: No results for input(s): HGBA1C in the last 72 hours. CBG: Recent Labs  Lab 05/24/17 2154 05/25/17 0611 05/25/17 1129 05/25/17 1642 05/25/17 2128  GLUCAP 166* 146* 188* 268* 269*   Lipid Profile: No results for input(s): CHOL, HDL, LDLCALC, TRIG, CHOLHDL,  LDLDIRECT in the last 72 hours. Thyroid Function Tests: No results for input(s): TSH, T4TOTAL, FREET4, T3FREE, THYROIDAB in the last 72 hours. Anemia Panel: Recent Labs    05/22/17 2231  VITAMINB12 312   Urine analysis:    Component Value Date/Time   COLORURINE YELLOW 02/02/2017 1939   APPEARANCEUR CLOUDY (A) 02/02/2017 1939   LABSPEC 1.013 02/02/2017 1939   PHURINE 5.0 02/02/2017 1939   GLUCOSEU NEGATIVE 02/02/2017 1939   GLUCOSEU 100 (A) 01/26/2017 1114   HGBUR SMALL (A) 02/02/2017 1939   HGBUR large 04/26/2009 1110   BILIRUBINUR Negative 05/21/2017 [place] 02/02/2017 1939   PROTEINUR 1+ 05/21/2017 1512   PROTEINUR 100 (A) 02/02/2017 1939   UROBILINOGEN negative (A) 05/21/2017 1512   UROBILINOGEN 0.2 01/26/2017 1114   NITRITE Negative 05/21/2017  1512   NITRITE NEGATIVE 02/02/2017 1939   LEUKOCYTESUR Negative 05/21/2017 1512   Sepsis Labs: @LABRCNTIP (procalcitonin:4,lacticidven:4)  ) Recent Results (from the past 240 hour(s))  Urine Culture     Status: None   Collection Time: 05/21/17  3:55 PM  Result Value Ref Range Status   MICRO NUMBER: [id number]  Final   SPECIMEN QUALITY: ADEQUATE  Final   Sample Source URINE  Final   STATUS: FINAL  Final   Result: No Growth  Final      Studies: No results found.  Scheduled Meds: . amLODipine  5 mg Oral Daily  . atorvastatin  20 mg Oral Daily  . cholecalciferol  1,000 Units Oral QPM  . citalopram  20 mg Oral Daily  . ferrous sulfate  325 mg Oral Q breakfast  . insulin aspart  0-9 Units Subcutaneous TID WC  . insulin aspart  3 Units Subcutaneous TID WC  . insulin glargine  25 Units Subcutaneous Daily  . isosorbide mononitrate  30 mg Oral QHS  . isosorbide mononitrate  60 mg Oral Daily  . metoprolol tartrate  12.5 mg Oral BID  . pantoprazole  40 mg Oral BID  . thiamine  100 mg Oral Q M,W,F  . triamterene-hydrochlorothiazide  0.5 tablet Oral Daily  . Warfarin - Pharmacist Dosing Inpatient   Does not apply q1800    Continuous Infusions: . heparin 700 Units/hr (05/25/17 1446)     LOS: 1 day     Desiree Hane, MD [facility] Pager [phone]  If 7PM-7AM, please contact night-coverage [email or web address] Password [facility] 05/25/2017, 9:42 PM

## 2017-05-26 ENCOUNTER — Telehealth: Payer: Self-pay | Admitting: Medical

## 2017-05-26 LAB — PROTIME-INR
INR: 1.72
PROTHROMBIN TIME: 20 s — AB (ref 11.4–15.2)

## 2017-05-26 LAB — GLUCOSE, CAPILLARY
GLUCOSE-CAPILLARY: 160 mg/dL — AB (ref 65–99)
GLUCOSE-CAPILLARY: 154 mg/dL — AB (ref 65–99)
GLUCOSE-CAPILLARY: 177 mg/dL — AB (ref 65–99)
Glucose-Capillary: 144 mg/dL — ABNORMAL HIGH (ref 65–99)

## 2017-05-26 LAB — BASIC METABOLIC PANEL
Anion gap: 10 (ref 5–15)
BUN: 47 mg/dL — AB (ref 6–20)
CALCIUM: 9.2 mg/dL (ref 8.9–10.3)
CO2: 21 mmol/L — ABNORMAL LOW (ref 22–32)
CREATININE: 2.09 mg/dL — AB (ref 0.44–1.00)
Chloride: 107 mmol/L (ref 101–111)
GFR calc Af Amer: 24 mL/min — ABNORMAL LOW (ref >60)
GFR, EST NON AFRICAN AMERICAN: 21 mL/min — AB (ref >60)
GLUCOSE: 170 mg/dL — AB (ref 65–99)
POTASSIUM: 4.2 mmol/L (ref 3.5–5.1)
SODIUM: 138 mmol/L (ref 135–145)

## 2017-05-26 LAB — CBC
HCT: 35.7 % — ABNORMAL LOW (ref 36.0–46.0)
Hemoglobin: 11.8 g/dL — ABNORMAL LOW (ref 12.0–15.0)
MCH: 30.6 pg (ref 26.0–34.0)
MCHC: 33.1 g/dL (ref 30.0–36.0)
MCV: 92.7 fL (ref 78.0–100.0)
PLATELETS: 167 10*3/uL (ref 150–400)
RBC: 3.85 MIL/uL — ABNORMAL LOW (ref 3.87–5.11)
RDW: 13.7 % (ref 11.5–15.5)
WBC: 8.6 10*3/uL (ref 4.0–10.5)

## 2017-05-26 LAB — HEPARIN LEVEL (UNFRACTIONATED)
HEPARIN UNFRACTIONATED: 0.28 [IU]/mL — AB (ref 0.30–0.70)
Heparin Unfractionated: 0.37 IU/mL (ref 0.30–0.70)

## 2017-05-26 MED ORDER — INSULIN GLARGINE 100 UNIT/ML ~~LOC~~ SOLN
30.0000 [IU] | Freq: Every day | SUBCUTANEOUS | Status: DC
  Administered 2017-05-27: 30 [IU] via SUBCUTANEOUS
  Filled 2017-05-26: qty 0.3

## 2017-05-26 MED ORDER — WARFARIN SODIUM 5 MG PO TABS
5.0000 mg | ORAL_TABLET | Freq: Once | ORAL | Status: AC
  Administered 2017-05-26: 5 mg via ORAL
  Filled 2017-05-26: qty 1

## 2017-05-26 NOTE — Progress Notes (Signed)
Physical Therapy Treatment Patient Details Name: Destiny [name] MRN: [id number] DOB: 03-Sep-1931 Today's Date: 05/26/2017    History of Present Illness 84 y.o. female with medical history significant of CAD s/p CABG, CVA in Nov.  Duodenal ulcer, last bleeding in 2014, DM2 on insulin, HTN. Patient went in to PCPs office today with c/o leg swelling, dizziness, and SOB. Patient found to have LEFT calf DVT on [place] today (not clear on chronicity of DVT given months of symptoms).  CT head negative but patient is noted to be ataxic on initial exam.    PT Comments    Patient progressing well towards PT goals. Improved ambulation distance today with only 2/4 DOE. Continues to require min A for balance and use of IV pole for support. Anticipate pt is a furniture walker at baseline but refuses to use RW for assist. Sp02 97% on RA during mobility. Will continue to follow and progress as tolerated.    Follow Up Recommendations  Home health PT;Supervision/Assistance - 24 hour     Equipment Recommendations  None recommended by PT    Recommendations for Other Services       Precautions / Restrictions Precautions Precautions: Fall Restrictions Weight Bearing Restrictions: No    Mobility  Bed Mobility               General bed mobility comments: Up in chair upon PT arrival.  Transfers Overall transfer level: Needs assistance Equipment used: None Transfers: Sit to/from Stand Sit to Stand: Min guard         General transfer comment: Min guard to steady in standing. Stood from Youth worker.  Ambulation/Gait Ambulation/Gait assistance: Min assist Ambulation Distance (Feet): 120 Feet Assistive device: (push IV pole) Gait Pattern/deviations: Step-through pattern;Drifts right/left;Narrow base of support Gait velocity: slowed   General Gait Details: minA for steadying, pt refuses RW but hold on to IV pole and reaches out to furniture and doorway to steady herself. 2/4 DOE. Sp02 98% on  RA.   Stairs            Wheelchair Mobility    Modified Rankin (Stroke Patients Only)       Balance Overall balance assessment: Needs assistance;History of Falls Sitting-balance support: Feet supported;No upper extremity supported Sitting balance-Leahy Scale: Good Sitting balance - Comments: Able to donn shoes with increased time sitting in chair reaching outside BoS.    Standing balance support: During functional activity;Single extremity supported Standing balance-Leahy Scale: Poor Standing balance comment: steady with static stance but requires UE support for dynamic standing.                             Cognition Arousal/Alertness: Awake/alert Behavior During Therapy: WFL for tasks assessed/performed Overall Cognitive Status: History of cognitive impairments - at baseline                                        Exercises      General Comments        Pertinent Vitals/Pain Pain Assessment: No/denies pain    Home Living                      Prior Function            PT Goals (current goals can now be found in the care plan section) Progress towards PT goals:  Progressing toward goals    Frequency    Min 3X/week      PT Plan Current plan remains appropriate    Co-evaluation              AM-PAC PT "6 Clicks" Daily Activity  Outcome Measure  Difficulty turning over in bed (including adjusting bedclothes, sheets and blankets)?: None Difficulty moving from lying on back to sitting on the side of the bed? : None Difficulty sitting down on and standing up from a chair with arms (e.g., wheelchair, bedside commode, etc,.)?: A Little Help needed moving to and from a bed to chair (including a wheelchair)?: A Little Help needed walking in hospital room?: A Little Help needed climbing 3-5 steps with a railing? : A Little 6 Click Score: 20    End of Session Equipment Utilized During Treatment: Gait belt Activity  Tolerance: Patient tolerated treatment well Patient left: in chair;with call bell/phone within reach;with chair alarm set Nurse Communication: Mobility status PT Visit Diagnosis: Unsteadiness on feet (R26.81);Other abnormalities of gait and mobility (R26.89);Difficulty in walking, not elsewhere classified (R26.2)     Time: 7195-9747 PT Time Calculation (min) (ACUTE ONLY): 18 min  Charges:  $Gait Training: 8-22 mins                    G Codes:       Wray Kearns, PT, DPT [place] 05/26/2017, 2:53 PM

## 2017-05-26 NOTE — Progress Notes (Signed)
ANTICOAGULATION CONSULT NOTE  Pharmacy Consult:  Heparin / Coumadin Indication: Acute DVT and Atrial fibrillation  No Known Allergies  Patient Measurements: Height: 5\' 6"  (167.6 cm) Weight: 164 lb 3.9 oz (74.5 kg) IBW/kg (Calculated) : 59.3  Vital Signs: Temp: 98.3 F (36.8 C) (03/13 1535) Temp Source: Oral (03/13 1535) BP: 129/62 (03/13 1535) Pulse Rate: 64 (03/13 1535)  Labs: Recent Labs    05/24/17 0727  05/25/17 0400 05/26/17 0528 05/26/17 1506  HGB 11.8*  --  11.6* 11.8*  --   HCT 35.2*  --  34.6* 35.7*  --   PLT 172  --  179 167  --   LABPROT 15.0  --  16.6* 20.0*  --   INR 1.19  --  1.35 1.72  --   HEPARINUNFRC 0.85*   < > 0.67 0.28* 0.37  CREATININE 2.19*  --   --  2.09*  --    < > = values in this interval not displayed.      Assessment: 86 YOF on heparin infusion to warfarin bridge for acute DVT and Afib with RVR. Pt with hx of CVA but MRI with no evidence of acute CVA.   Heparin level therapeutic and INR is trending up toward goal.  No bleeding reported.    Goal of Therapy:  Heparin level 0.3-0.7 units/mL INR 2 - 3 Monitor platelets by anticoagulation protocol: Yes    Plan:  Continue heparin gtt at 800 units/hr Coumadin 5mg  PO today Daily heparin level, PT / INR, CBc   Rosaleah Person D. Mina Marble, PharmD, BCPS Pager:  [phone] 05/26/2017, 4:29 PM

## 2017-05-26 NOTE — Progress Notes (Signed)
Inpatient Diabetes Program Recommendations  AACE/ADA: New Consensus Statement on Inpatient Glycemic Control (2015)  Target Ranges:  Prepandial:   less than 140 mg/dL      Peak postprandial:   less than 180 mg/dL (1-2 hours)      Critically ill patients:  140 - 180 mg/dL   Lab Results  Component Value Date   GLUCAP 144 (H) 05/26/2017   HGBA1C 8.4 (H) 02/02/2017    Review of Glycemic Control Results for Destiny [name], Destiny [name] (MRN [id number]) as of 05/26/2017 14:00  Ref. Range 05/25/2017 16:42 05/25/2017 21:28 05/26/2017 06:13 05/26/2017 11:37  Glucose-Capillary Latest Ref Range: 65 - 99 mg/dL 268 (H) 269 (H) 160 (H) 144 (H)    Diabetes history: Type 2 DM Outpatient Diabetes medications: Lantus 35 Units in AM, Humalog 5-10 Units TID Current orders for Inpatient glycemic control: Lantus 25 Units QD, Novolog 0-9 Units TID, Novolog 3 units TID  Inpatient Diabetes Program Recommendations:    Consider increasing Lantus to 30 units QD and adding Novolog 0-5 Units QHS. Text page sent.   Thanks, Bronson Curb, MSN, RNC-OB Diabetes Coordinator [phone] (8a-5p)

## 2017-05-26 NOTE — Progress Notes (Signed)
ANTICOAGULATION CONSULT NOTE  Pharmacy Consult for Heparin  Indication: Acute DVT and Atrial fibrillation  No Known Allergies  Patient Measurements: Height: 5\' 6"  (167.6 cm) Weight: 164 lb 3.9 oz (74.5 kg) IBW/kg (Calculated) : 59.3  Vital Signs: Temp: 98.1 F (36.7 C) (03/13 0430) Temp Source: Oral (03/13 0430) BP: 155/80 (03/13 0430) Pulse Rate: 69 (03/13 0430)  Labs: Recent Labs    05/23/17 0729 05/24/17 0727 05/24/17 1405 05/25/17 0400 05/26/17 0528  HGB 12.6 11.8*  --  11.6*  --   HCT 37.2 35.2*  --  34.6*  --   PLT 169 172  --  179  --   LABPROT 14.4 15.0  --  16.6* 20.0*  INR 1.13 1.19  --  1.35 1.72  HEPARINUNFRC 0.59 0.85* 0.68 0.67 0.28*  CREATININE  --  2.19*  --   --  2.09*     Medical History: Past Medical History:  Diagnosis Date  . Acute renal insufficiency 12/24/2013  . Amaurosis fugax   . Arthritis 12/06/2012  . Atherosclerosis   . CAD (coronary artery disease)    a. s/p CABG x 4 (LIMA->LAD, VG->Diag, VG->OM1->OM3);  b. 2010 Cath: 3/4 patent grafts (VG->diag occluded), 3vd, sev PVD ->Med Rx;  c. 12/2011 Lexi MV: sm area of mild mid and apical ant wall ischemia ->med rx.  . Carotid bruit   . Chronic kidney disease (CKD) stage G4/A1, severely decreased glomerular filtration rate (GFR) between 15-29 mL/min/1.73 square meter and albuminuria creatinine ratio less than 30 mg/g (HCC)    GFR 25 on 12/25/13  . CVA (cerebral infarction)   . Dementia    pt denies  . Depression 12/24/2013  . Diabetes mellitus type II    insulin dependent.   . Diabetic peripheral neuropathy ([place])   . Diabetic retinopathy   . Diverticulosis   . Duodenal ulcer 2009 and 11/2011   caused GI bleeding  . Gallstones 2009   seen on CT scan  . GI bleed 04/2011, 11/2011   found non-bleeding duodenal ulcers  . Hyperlipidemia   . Hypersomnolence 03/12/2013  . Hypertension   . Iron deficiency anemia secondary to blood loss (chronic)   . Melena 1015/2015   PEPTIC ULCER     REQUIRING TRANSFUSION  . Pedal edema 10/12/2016  . PVD (peripheral vascular disease) ([place])   . Rib fracture 04/21/2015  . Thiamine deficiency 08/13/2015  . UTI (urinary tract infection) 04/08/2014  . Weight loss, non-intentional 01/29/2014    Assessment: 84 yo female currently on heparin infusion to warfarin bridge for acute DVT and afib with RVR. Pt with hx of CVA but MRI with no evidence of acute CVA. No known a/c PTA.   Heparin level just below goal this AM   Goal of Therapy:  Heparin level 0.3-0.7 units/mL Monitor platelets by anticoagulation protocol: Yes    Plan:  Inc heparin to 800 units/hr 1500 HL  Narda Bonds, PharmD, BCPS Clinical Pharmacist Phone: [phone]

## 2017-05-26 NOTE — Telephone Encounter (Signed)
Copied from [place] [phone]. Topic: Quick Communication - Rx Refill/Question >> May 26, 2017  9:01 AM Margot Ables wrote: [name] states fax was sent Friday 05/21/17 to clarify inhaler order. Pts insurance covers Proair inhaler. Please call pharmacy to clarify ok to provide or send new RX.

## 2017-05-26 NOTE — Progress Notes (Signed)
Patient ID: Destiny [name], female   DOB: 16-Jun-1931, 84 y.o.   MRN: [id number]  PROGRESS NOTE    KORRYN PANCOAST  VEH:[id number] DOB: 1931/05/12 DOA: 05/21/2017 PCP: Mosie Lukes, MD   Brief Narrative:  84 year old female with history of CVA, dementia, CKD stage III, CAD status post CABG, hypertension presented on 05/21/2017 with left lower leg swelling at PCPs office and was found to have acute left DVT and new onset atrial fibrillation with rapid ventricular rate.  She was started on heparin drip.   Assessment & Plan:   Principal Problem:   Ataxia Active Problems:   Essential hypertension   Diabetes mellitus type 2, insulin dependent (HCC)   CKD (chronic kidney disease) stage 4, GFR 15-29 ml/min (HCC)   History of CVA (cerebrovascular accident)   Shortness of breath   DVT (deep venous thrombosis) (HCC)   Atrial fibrillation with RVR (HCC)    Acute left lower extremity DVT, stable -Continue heparin drip along with warfarin.  INR is 1.72 today.  Repeat a.m. INR.  -Hemoglobin stable.    Shortness of breath, resolved -Findings from TTE did not definitively diagnose PE, limited given no ability to get CTA -Patient currently normal oxygen saturation on room air -Possible dyspnea related to atrial fibrillation with intermittent bouts of RVR as mentioned below -Continue incentive spirometry  Atrial Fibrillation, new onset, currently in normal sinus rhythm and rate controlled Intermittent bouts of RVR nonsustained CHADSVASC: 7HTN (1), age greater than 20 (2), TIA/stroke (2), female (1), diabetes (1) - continue to monitor on telemetry - Continue metoprolol.  Currently rate controlled mostly.  Anticoagulation plan as above.  Outpatient follow-up with cardiology - NOACs not an option in light of CKD  Unsteady gait, weakness -MRI negative for any acute abnormalities, consistent with chronic ischemic changes -Potentially related to acute left DVT and dyspnea associated with new  onset atrial fibrillation.   -B12 within normal limits -Follow PT recommendations.  Outpatient PT follow-up  CKD, stage IV, stable -Stable and at baseline.  Repeat a.m. labs  Type 2 diabetes A1c 8.4 (01/2017) -Continue Lantus with NovoLog and sliding scale coverage  Hypertension, not at goal -Blood pressure on the higher side.  Monitor. -Continue home amlodipine, Maxzide and isosorbide mononitrate  COPD, stable -home Atrovent as needed, albuterol as needed, since telemetry today  GERD, stable -Continue Zantac  CAD status post CABG x4 (2010), stable No chest pain -Continue home Imdur 60 mg a.m., 30 mg p.m. - Hold home Plavix in light of current heparin drip and history of GI bleeds  Hyperlipidemia -Continue home atorvastatin  Depression, stable -Continue Celexa  Iron deficiency anemia, chronic, stable -Monitoring CBC while on anticoagulation, continue home oral iron  Mild dementia -Has failed prior trials of Aricept and Namenda     DVT prophylaxis: Heparin and warfarin Code Status: Full Family Communication: None at bedside Disposition Plan: Home once INR is above 2  Consultants: None  Procedures:  Lower extremity duplex ultrasound on 05/21/2017: IMPRESSION: Positive for DVT in the left calf. Evidence for thrombus in left gastrocnemius veins.  Small left knee Baker's cyst.    TTE on 05/22/17: EF 50-55%.  No wall motion normalities.  Pulmonary artery systolic pressure 35     Antimicrobials: None   Subjective: Patient seen and examined at bedside.  She denies any overnight fever, nausea, vomiting, shortness of breath.  Objective: Vitals:   05/25/17 2030 05/26/17 0030 05/26/17 0430 05/26/17 0800  BP: (!) 172/70 (!) 157/76 (!) 155/80 Marland Kitchen)  142/81  Pulse: 64 (!) 109 69 76  Resp: 18 18 18 18   Temp: 99.1 F (37.3 C) 98.2 F (36.8 C) 98.1 F (36.7 C) 97.7 F (36.5 C)  TempSrc: Oral Oral Oral Oral  SpO2: 98% 99% 99% 95%  Weight:        Height:       No intake or output data in the 24 hours ending 05/26/17 1051 Filed Weights   05/21/17 2026 05/22/17 0020  Weight: 74 kg (163 lb 2.3 oz) 74.5 kg (164 lb 3.9 oz)    Examination:  General exam: Appears calm and comfortable  Respiratory system: Bilateral decreased breath sound at bases Cardiovascular system: S1 & S2 heard, rate controlled  gastrointestinal system: Abdomen is nondistended, soft and nontender. Normal bowel sounds heard. Extremities: No cyanosis, clubbing; trace edema    Data Reviewed: I have personally reviewed following labs and imaging studies  CBC: Recent Labs  Lab 05/21/17 1430 05/22/17 0551 05/23/17 0729 05/24/17 0727 05/25/17 0400 05/26/17 0528  WBC 8.2 7.6 8.5 8.4 9.1 8.6  NEUTROABS 5.0  --   --   --   --   --   HGB 12.6 11.8* 12.6 11.8* 11.6* 11.8*  HCT 37.3 34.3* 37.2 35.2* 34.6* 35.7*  MCV 92.4 91.7 91.4 90.7 91.5 92.7  PLT 198.0 163 169 172 179 947   Basic Metabolic Panel: Recent Labs  Lab 05/21/17 1430 05/22/17 2231 05/24/17 0727 05/26/17 0528  NA 137 135 137 138  K 4.0 4.1 4.1 4.2  CL 102 101 105 107  CO2 27 22 21* 21*  GLUCOSE 270* 198* 189* 170*  BUN 38* 45* 49* 47*  CREATININE 1.81* 2.26* 2.19* 2.09*  CALCIUM 9.7 9.4 9.4 9.2   GFR: Estimated Creatinine Clearance: 20.7 mL/min (A) (by C-G formula based on SCr of 2.09 mg/dL (H)). Liver Function Tests: Recent Labs  Lab 05/21/17 1430  AST 13  ALT 15  ALKPHOS 72  BILITOT 0.9  PROT 6.8  ALBUMIN 3.8   No results for input(s): LIPASE, AMYLASE in the last 168 hours. No results for input(s): AMMONIA in the last 168 hours. Coagulation Profile: Recent Labs  Lab 05/22/17 2231 05/23/17 0729 05/24/17 0727 05/25/17 0400 05/26/17 0528  INR 1.14 1.13 1.19 1.35 1.72   Cardiac Enzymes: Recent Labs  Lab 05/22/17 0111 05/22/17 0551 05/22/17 1210  TROPONINI <0.03 <0.03 <0.03   BNP (last 3 results) Recent Labs    05/21/17 1430  PROBNP 210.0*   HbA1C: No  results for input(s): HGBA1C in the last 72 hours. CBG: Recent Labs  Lab 05/25/17 0611 05/25/17 1129 05/25/17 1642 05/25/17 2128 05/26/17 0613  GLUCAP 146* 188* 268* 269* 160*   Lipid Profile: No results for input(s): CHOL, HDL, LDLCALC, TRIG, CHOLHDL, LDLDIRECT in the last 72 hours. Thyroid Function Tests: No results for input(s): TSH, T4TOTAL, FREET4, T3FREE, THYROIDAB in the last 72 hours. Anemia Panel: No results for input(s): VITAMINB12, FOLATE, FERRITIN, TIBC, IRON, RETICCTPCT in the last 72 hours. Sepsis Labs: No results for input(s): PROCALCITON, LATICACIDVEN in the last 168 hours.  Recent Results (from the past 240 hour(s))  Urine Culture     Status: None   Collection Time: 05/21/17  3:55 PM  Result Value Ref Range Status   MICRO NUMBER: [id number]  Final   SPECIMEN QUALITY: ADEQUATE  Final   Sample Source URINE  Final   STATUS: FINAL  Final   Result: No Growth  Final         Radiology Studies:  No results found.      Scheduled Meds: . amLODipine  5 mg Oral Daily  . atorvastatin  20 mg Oral Daily  . cholecalciferol  1,000 Units Oral QPM  . citalopram  20 mg Oral Daily  . ferrous sulfate  325 mg Oral Q breakfast  . insulin aspart  0-9 Units Subcutaneous TID WC  . insulin aspart  3 Units Subcutaneous TID WC  . insulin glargine  25 Units Subcutaneous Daily  . isosorbide mononitrate  30 mg Oral QHS  . isosorbide mononitrate  60 mg Oral Daily  . metoprolol tartrate  12.5 mg Oral BID  . pantoprazole  40 mg Oral BID  . thiamine  100 mg Oral Q M,W,F  . triamterene-hydrochlorothiazide  0.5 tablet Oral Daily  . Warfarin - Pharmacist Dosing Inpatient   Does not apply q1800   Continuous Infusions: . heparin 800 Units/hr (05/26/17 0730)     LOS: 2 days        Aline August, MD [facility] Pager [phone]  If 7PM-7AM, please contact night-coverage [email or web address] Password [facility] 05/26/2017, 10:51 AM

## 2017-05-27 LAB — BASIC METABOLIC PANEL
Anion gap: 12 (ref 5–15)
BUN: 48 mg/dL — ABNORMAL HIGH (ref 6–20)
CALCIUM: 9 mg/dL (ref 8.9–10.3)
CO2: 20 mmol/L — ABNORMAL LOW (ref 22–32)
CREATININE: 2.1 mg/dL — AB (ref 0.44–1.00)
Chloride: 104 mmol/L (ref 101–111)
GFR, EST AFRICAN AMERICAN: 24 mL/min — AB (ref >60)
GFR, EST NON AFRICAN AMERICAN: 21 mL/min — AB (ref >60)
GLUCOSE: 153 mg/dL — AB (ref 65–99)
Potassium: 4.1 mmol/L (ref 3.5–5.1)
Sodium: 136 mmol/L (ref 135–145)

## 2017-05-27 LAB — CBC
HCT: 34.5 % — ABNORMAL LOW (ref 36.0–46.0)
Hemoglobin: 11.6 g/dL — ABNORMAL LOW (ref 12.0–15.0)
MCH: 31 pg (ref 26.0–34.0)
MCHC: 33.6 g/dL (ref 30.0–36.0)
MCV: 92.2 fL (ref 78.0–100.0)
PLATELETS: 171 10*3/uL (ref 150–400)
RBC: 3.74 MIL/uL — AB (ref 3.87–5.11)
RDW: 13.4 % (ref 11.5–15.5)
WBC: 9.2 10*3/uL (ref 4.0–10.5)

## 2017-05-27 LAB — GLUCOSE, CAPILLARY
Glucose-Capillary: 158 mg/dL — ABNORMAL HIGH (ref 65–99)
Glucose-Capillary: 187 mg/dL — ABNORMAL HIGH (ref 65–99)

## 2017-05-27 LAB — HEPARIN LEVEL (UNFRACTIONATED): HEPARIN UNFRACTIONATED: 0.46 [IU]/mL (ref 0.30–0.70)

## 2017-05-27 LAB — PROTIME-INR
INR: 2.1
Prothrombin Time: 23.4 seconds — ABNORMAL HIGH (ref 11.4–15.2)

## 2017-05-27 LAB — MAGNESIUM: MAGNESIUM: 1.9 mg/dL (ref 1.7–2.4)

## 2017-05-27 MED ORDER — WARFARIN SODIUM 5 MG PO TABS: 5.0000 mg | ORAL_TABLET | Freq: Every day | ORAL | 0 refills | Status: DC

## 2017-05-27 MED ORDER — METOPROLOL TARTRATE 25 MG PO TABS: 12.5000 mg | ORAL_TABLET | Freq: Two times a day (BID) | ORAL | 0 refills | Status: DC

## 2017-05-27 MED ORDER — ALBUTEROL SULFATE HFA 108 (90 BASE) MCG/ACT IN AERS: 2.0000 | INHALATION_SPRAY | Freq: Four times a day (QID) | RESPIRATORY_TRACT | 2 refills | Status: AC | PRN

## 2017-05-27 MED FILL — PROAIR HFA 90 MCG INHALER: 108 (90 BAS | 25 days supply | Qty: 9 | Fill #0

## 2017-05-27 MED FILL — raNITIdine HCL 300 MG TABS: 300 | 90 days supply | Qty: 45 | Fill #1

## 2017-05-27 MED FILL — TRIAMTERENE-HCTZ 37.5-25 MG: 37.5-25 | 30 days supply | Qty: 15 | Fill #2

## 2017-05-27 MED FILL — WARFARIN SODIUM 5 MG TABLET: 5 | 30 days supply | Qty: 30 | Fill #0

## 2017-05-27 MED FILL — CITALOPRAM HBR 20 MG TABLET: 20 | 90 days supply | Qty: 90 | Fill #0

## 2017-05-27 MED FILL — METOPROLOL TARTRATE 25 MG T: 25 | 30 days supply | Qty: 30 | Fill #0

## 2017-05-27 NOTE — Progress Notes (Signed)
Patient sleeped well nothing adverse to report.

## 2017-05-27 NOTE — Telephone Encounter (Signed)
I sent proair to pt pharmacy. Notify please.

## 2017-05-27 NOTE — Care Management Note (Signed)
Case Management Note  Patient Details  Name: Destiny [name] MRN: [id number] Date of Birth: 26-Oct-1931  Subjective/Objective:                    Action/Plan: Pt discharging home with [facility] services. CM provided the patient and daughter in law choice. They selected [facility]. Butch Penny with [facility] notified and accepted the referral.  Pt has a caregiver Jeannene Patella Narda Bonds [phone]) that will be the contact for [facility]. Butch Penny aware.  Per daughter in law, pt has walker at home. Either family or caregiver can provide 24 hour supervision.  Family to provide transportation home.   Expected Discharge Date:  05/27/17               Expected Discharge Plan:  [place]  In-House Referral:  NA  Discharge planning Services  CM Consult  Post Acute Care Choice:  Home Health Choice offered to:  Patient, Adult Children, HC POA / Guardian  DME Arranged:    DME Agency:     HH Arranged:  PT [place]:  [place]  Status of Service:  Completed, signed off  If discussed at [place] of Stay Meetings, dates discussed:    Additional Comments:  Pollie Friar, RN 05/27/2017, 12:14 PM

## 2017-05-27 NOTE — Discharge Summary (Signed)
Physician Discharge Summary  Destiny [name] GPQ:[id number] DOB: 1931/05/14 DOA: 05/21/2017  PCP: Mosie Lukes, MD  Admit date: 05/21/2017 Discharge date: 05/27/2017  Admitted From: Home Disposition:  Home  Recommendations for Outpatient Follow-up:  1. Follow up with PCP in 1 week 2. Follow-up at cardiology/Dr. Kyla Balzarine office on 05/31/2017 at 10:30 AM for repeat INR check  Home Health: Yes Equipment/Devices: None  discharge Condition: Stable CODE STATUS: Full Diet recommendation: Heart Healthy   Brief/Interim Summary: 84 year old female with history of CVA, dementia, CKD stage III, CAD status post CABG, hypertension presented on 05/21/2017 with left lower leg swelling at PCPs office and was found to have acute left DVT and new onset atrial fibrillation with rapid ventricular rate.  She was started on heparin drip.  Coumadin was also started.  Her INR is above 2 today.  She will be discharged home with outpatient INR check up at cardiology/Dr. Kyla Balzarine office.    Discharge Diagnoses:  Principal Problem:   Ataxia Active Problems:   Essential hypertension   Diabetes mellitus type 2, insulin dependent (HCC)   CKD (chronic kidney disease) stage 4, GFR 15-29 ml/min (HCC)   History of CVA (cerebrovascular accident)   Shortness of breath   DVT (deep venous thrombosis) (HCC)   Atrial fibrillation with RVR (HCC)  Acute left lower extremity DVT, stable -Has been on heparin drip and warfarin.  INR is 2.10 today.  Discharge home on Coumadin 5 mg daily.   -Follow-up at cardiology/Dr. Kyla Balzarine office on 05/31/2017 at 10:30 AM for repeat INR check.  Coumadin dose can be adjusted at that time accordingly.  Shortness of breath,resolved -Findings from TTE did not definitively diagnose PE, limited given no ability to get CTA -Patient currently normal oxygen saturation on room air -Possible dyspnea related to atrial fibrillation with intermittent bouts of RVR as mentioned below -Outpatient  follow-up  Atrial Fibrillation, new onset, currently in normal sinus rhythm and rate controlled Intermittent bouts of RVRnonsustained CHADSVASC: 7HTN (1), age greater than 60 (2), TIA/stroke (2), female (1), diabetes (1) - continue to monitor on telemetry -Continue metoprolol.  Currently rate controlled mostly.  Anticoagulation plan as above.  Outpatient follow-up with cardiology - NOACs not an option in light of CKD  Unsteady gait, weakness -MRI negative for any acute abnormalities, consistent with chronic ischemic changes -Potentially related to acute left DVT and dyspnea associated with new onset atrial fibrillation.  -B12 within normal limits - Outpatient PT follow-up  CKD, stage IV, stable -Stable and at baseline.    Outpatient follow-up with primary care provider  Type 2 diabetes A1c 8.4 (01/2017) -Continue home regimen.  Outpatient follow-up  Hypertension, not at goal -Blood pressure stable.  Continue home regimen along with metoprolol   COPD, stable -Outpatient follow-up  GERD, stable -Continue Zantac  CAD status post CABG x4 (2010), stable No chest pain -Continue home Imdur 60 mg a.m., 30 mg p.m. - Continue Plavix.  Outpatient follow-up with cardiology  Hyperlipidemia -Continue home atorvastatin  Depression, stable -Continue Celexa  Iron deficiency anemia, chronic, stable -Outpatient follow-up.  Continue home oral iron  Mild dementia -Has failed prior trials of Aricept and Namenda      Discharge Instructions  Discharge Instructions    Ambulatory referral to Cardiology   Complete by:  As directed    Afib with RVR; DVT   Call MD for:  difficulty breathing, headache or visual disturbances   Complete by:  As directed    Call MD for:  extreme fatigue  Complete by:  As directed    Call MD for:  hives   Complete by:  As directed    Call MD for:  persistant dizziness or light-headedness   Complete by:  As directed    Call MD for:   persistant nausea and vomiting   Complete by:  As directed    Call MD for:  severe uncontrolled pain   Complete by:  As directed    Call MD for:  temperature >100.4   Complete by:  As directed    Diet - low sodium heart healthy   Complete by:  As directed    Increase activity slowly   Complete by:  As directed      Allergies as of 05/27/2017   No Known Allergies     Medication List    TAKE these medications   albuterol 108 (90 Base) MCG/ACT inhaler Commonly known as:  PROAIR HFA Inhale 2 puffs into the lungs every 6 (six) hours as needed for wheezing or shortness of breath.   amLODipine 5 MG tablet Commonly known as:  NORVASC Take 1 tablet (5 mg total) by mouth daily.   atorvastatin 20 MG tablet Commonly known as:  LIPITOR Take 1 tablet (20 mg total) by mouth daily.   citalopram 20 MG tablet Commonly known as:  CELEXA Take 1 tablet (20 mg total) by mouth daily.   clopidogrel 75 MG tablet Commonly known as:  PLAVIX TAKE 1 TABLET(75 MG) BY MOUTH DAILY   ferrous sulfate 325 (65 FE) MG tablet Take 325 mg daily with breakfast by mouth.   Fish Oil 1200 MG Caps Take 1,200 mg by mouth daily.   Insulin Glargine 100 UNIT/ML Solostar Pen Commonly known as:  LANTUS SOLOSTAR ADMINISTER 35 UNITS UNDER THE SKIN DAILY AT in the morning. INCREASE BY 2 UNITS EVERY 3 DAYS IF ALL FSG>120   insulin lispro 100 UNIT/ML KiwkPen Commonly known as:  HUMALOG KWIKPEN Inject 0.05-0.1 mLs (5-10 Units total) into the skin 3 (three) times daily with meals.   ipratropium 17 MCG/ACT inhaler Commonly known as:  ATROVENT HFA Inhale 2 puffs into the lungs every 6 (six) hours as needed for wheezing.   isosorbide mononitrate 60 MG 24 hr tablet Commonly known as:  IMDUR TAKE 60 mg TABLET BY MOUTH EVERY DAY in the morning What changed:  Another medication with the same name was changed. Make sure you understand how and when to take each.   isosorbide mononitrate 30 MG 24 hr tablet Commonly known  as:  IMDUR Take 1 tablet (30 mg total) by mouth at bedtime. What changed:  when to take this   metoprolol tartrate 25 MG tablet Commonly known as:  LOPRESSOR Take 0.5 tablets (12.5 mg total) by mouth 2 (two) times daily.   nitroGLYCERIN 0.4 MG SL tablet Commonly known as:  NITROSTAT Place 0.4 mg under the tongue every 5 (five) minutes as needed for chest pain.   pantoprazole 40 MG tablet Commonly known as:  PROTONIX Take 40 mg by mouth every Monday, Wednesday, and Friday.   ranitidine 300 MG tablet Commonly known as:  ZANTAC Take 1/2 tablet at bedtime   thiamine 100 MG tablet Commonly known as:  VITAMIN B-1 Take 1 tablet (100 mg total) by mouth daily. What changed:    when to take this  additional instructions   triamterene-hydrochlorothiazide 37.5-25 MG tablet Commonly known as:  MAXZIDE-25 Take 0.5 tablets by mouth daily.   Vitamin D 1000 units capsule Take 1,000 Units  by mouth every evening.   warfarin 5 MG tablet Commonly known as:  COUMADIN Take 1 tablet (5 mg total) by mouth daily.      Follow-up Information    Mosie Lukes, MD. Schedule an appointment as soon as possible for a visit in 1 week(s).   Specialty:  Family Medicine Contact information: [address] [place] [address] [phone]        Josue Hector, MD Follow up.   Specialty:  Cardiology Why:  Follow up on 06/01/27 at 10.30am at Dr. Kyla Balzarine office for INR check Contact information: [address] [place] [address] [phone]          No Known Allergies  Consultations:  None   Procedures/Studies: Dg Chest 2 View  Result Date: 05/21/2017 CLINICAL DATA:  Cough and intermittent wheezing. EXAM: CHEST - 2 VIEW COMPARISON:  02/01/2017 FINDINGS: Chronic cardiomegaly. There is aortic tortuosity that is stable. Status post CABG. The lungs are clear. Mild hyperinflation. No acute osseous finding. IMPRESSION: 1. No evidence of active  disease. 2. Chronic cardiomegaly. Electronically Signed   By: Monte Fantasia M.D.   On: 05/21/2017 17:57   Ct Head Wo Contrast  Result Date: 05/21/2017 CLINICAL DATA:  84 year old female with increased dizziness. EXAM: CT HEAD WITHOUT CONTRAST TECHNIQUE: Contiguous axial images were obtained from the base of the skull through the vertex without intravenous contrast. COMPARISON:  02/02/2017 CT and prior studies FINDINGS: Brain: No evidence of acute infarction, hemorrhage, hydrocephalus, extra-axial collection or mass lesion/mass effect. Atrophy, chronic small-vessel white matter ischemic changes again noted. Remote bilateral basal ganglia, periventricular white matter and RIGHT parietal/occipital infarcts again noted. Vascular: Atherosclerotic calcifications again noted. Skull: Normal. Negative for fracture or focal lesion. Sinuses/Orbits: No acute abnormality Other: None IMPRESSION: 1. No evidence of acute intracranial abnormality 2. Atrophy, chronic small-vessel white matter ischemic changes and remote infarcts as described. Electronically Signed   By: Margarette Canada M.D.   On: 05/21/2017 18:01   Mr Brain Wo Contrast  Result Date: 05/22/2017 CLINICAL DATA:  Ataxia.  History of stroke. EXAM: MRI HEAD WITHOUT CONTRAST TECHNIQUE: Multiplanar, multiecho pulse sequences of the brain and surrounding structures were obtained without intravenous contrast. COMPARISON:  Head CT 05/21/2017 and MRI 02/02/2017 FINDINGS: Brain: There is no evidence of acute infarct, intracranial hemorrhage, mass, midline shift, or extra-axial fluid collection. A partially empty sella is again incidentally noted. There is moderate cerebral atrophy. Chronic infarcts are again seen in the left greater than right basal ganglia, deep white matter on the left, and right occipital lobe. There also may be a tiny chronic left cerebellar infarct. T2 hyperintensities elsewhere in the cerebral white matter bilaterally are similar to the prior MRI and  nonspecific but compatible with moderate chronic small vessel ischemic disease. Ex vacuo enlargement of the left lateral ventricle and a cavum septum pellucidum et vergae are noted. Vascular: Stable abnormal appearance of the distal right V4 segment likely reflecting chronic occlusion distal to PICA. Other major intracranial vascular flow voids are preserved. Skull and upper cervical spine: Unremarkable bone marrow signal. Sinuses/Orbits: Bilateral cataract extraction. No significant sinus disease. Other: None. IMPRESSION: 1. No acute intracranial abnormality. 2. Moderate chronic small vessel ischemic disease and cerebral atrophy with chronic infarcts as above. Electronically Signed   By: Logan Bores M.D.   On: 05/22/2017 10:51   US Venous Img Lower Unilateral Left  Result Date: 05/21/2017 CLINICAL DATA:  Left knee weakness and swelling for 2  days. EXAM: LEFT LOWER EXTREMITY VENOUS DOPPLER ULTRASOUND TECHNIQUE: Gray-scale sonography with graded compression, as well as color Doppler and duplex ultrasound were performed to evaluate the lower extremity deep venous systems from the level of the common femoral vein and including the common femoral, femoral, profunda femoral, popliteal and calf veins including the posterior tibial, peroneal and gastrocnemius veins when visible. The superficial great saphenous vein was also interrogated. Spectral Doppler was utilized to evaluate flow at rest and with distal augmentation maneuvers in the common femoral, femoral and popliteal veins. COMPARISON:  None. FINDINGS: Contralateral Common Femoral Vein: Respiratory phasicity is normal and symmetric with the symptomatic side. No evidence of thrombus. Normal compressibility. Common Femoral Vein: No evidence of thrombus. Normal compressibility, respiratory phasicity and response to augmentation. Saphenofemoral Junction: No evidence of thrombus. Normal compressibility and flow on color Doppler imaging. Profunda Femoral Vein: No  evidence of thrombus. Normal compressibility and flow on color Doppler imaging. Femoral Vein: No evidence of thrombus. Normal compressibility, respiratory phasicity and response to augmentation. Popliteal Vein: No evidence of thrombus. Normal compressibility, respiratory phasicity and response to augmentation. Calf Veins: There are multiple noncompressible gastrocnemius veins. Some of these veins have occlusive thrombus and others have nonocclusive thrombus. Visualized posterior tibial veins are compressible without thrombus. Superficial Great Saphenous Vein: No evidence of thrombus. Normal compressibility. Other Findings: Small fluid collection in the popliteal fossa measures 3.5 x 0.7 x 1.6 cm. Findings are compatible with a small Baker's cyst. IMPRESSION: Positive for DVT in the left calf. Evidence for thrombus in left gastrocnemius veins. Small left knee Baker's cyst. Electronically Signed   By: Markus Daft M.D.   On: 05/21/2017 18:03    Lower extremity duplex ultrasound on 05/21/2017: IMPRESSION: Positive for DVT in the left calf. Evidence for thrombus in left gastrocnemius veins.  Small left knee Baker's cyst.    TTE on 05/22/17: EF 50-55%. No wall motion normalities. Pulmonary artery systolic pressure 35       Subjective: Patient seen and examined at bedside.  She feels much better and wants to go home.  No overnight fever, nausea or vomiting.  Discharge Exam: Vitals:   05/27/17 0410 05/27/17 0752  BP: (!) 139/58 126/62  Pulse: 63 65  Resp: 18 18  Temp: 97.8 F (36.6 C) 97.7 F (36.5 C)  SpO2: 98% 96%   Vitals:   05/26/17 2034 05/27/17 0030 05/27/17 0410 05/27/17 0752  BP: 140/67 (!) 155/77 (!) 139/58 126/62  Pulse: 64  63 65  Resp: 18 18 18 18   Temp: 97.8 F (36.6 C) 99 F (37.2 C) 97.8 F (36.6 C) 97.7 F (36.5 C)  TempSrc: Oral Oral Oral Oral  SpO2: 96% 99% 98% 96%  Weight:      Height:        General: Pt is alert, awake, not in acute  distress Cardiovascular: Rate controlled, S1/S2 + Respiratory: Bilateral decreased breath sounds at bases Abdominal: Soft, NT, ND, bowel sounds + Extremities: trace edema, no cyanosis    The results of significant diagnostics from this hospitalization (including imaging, microbiology, ancillary and laboratory) are listed below for reference.     Microbiology: Recent Results (from the past 240 hour(s))  Urine Culture     Status: None   Collection Time: 05/21/17  3:55 PM  Result Value Ref Range Status   MICRO NUMBER: [id number]  Final   SPECIMEN QUALITY: ADEQUATE  Final   Sample Source URINE  Final   STATUS: FINAL  Final   Result: No Growth  Final     Labs: BNP (last 3 results) No results for input(s): BNP in the last 8760 hours. Basic Metabolic Panel: Recent Labs  Lab 05/21/17 1430 05/22/17 2231 05/24/17 0727 05/26/17 0528 05/27/17 0417  NA 137 135 137 138 136  K 4.0 4.1 4.1 4.2 4.1  CL 102 101 105 107 104  CO2 27 22 21* 21* 20*  GLUCOSE 270* 198* 189* 170* 153*  BUN 38* 45* 49* 47* 48*  CREATININE 1.81* 2.26* 2.19* 2.09* 2.10*  CALCIUM 9.7 9.4 9.4 9.2 9.0  MG  --   --   --   --  1.9   Liver Function Tests: Recent Labs  Lab 05/21/17 1430  AST 13  ALT 15  ALKPHOS 72  BILITOT 0.9  PROT 6.8  ALBUMIN 3.8   No results for input(s): LIPASE, AMYLASE in the last 168 hours. No results for input(s): AMMONIA in the last 168 hours. CBC: Recent Labs  Lab 05/21/17 1430  05/23/17 0729 05/24/17 0727 05/25/17 0400 05/26/17 0528 05/27/17 0417  WBC 8.2   < > 8.5 8.4 9.1 8.6 9.2  NEUTROABS 5.0  --   --   --   --   --   --   HGB 12.6   < > 12.6 11.8* 11.6* 11.8* 11.6*  HCT 37.3   < > 37.2 35.2* 34.6* 35.7* 34.5*  MCV 92.4   < > 91.4 90.7 91.5 92.7 92.2  PLT 198.0   < > 169 172 179 167 171   < > = values in this interval not displayed.   Cardiac Enzymes: Recent Labs  Lab 05/22/17 0111 05/22/17 0551 05/22/17 1210  TROPONINI <0.03 <0.03 <0.03   BNP: Invalid  input(s): POCBNP CBG: Recent Labs  Lab 05/26/17 0613 05/26/17 1137 05/26/17 1646 05/26/17 2126 05/27/17 0652  GLUCAP 160* 144* 154* 177* 158*   D-Dimer No results for input(s): DDIMER in the last 72 hours. Hgb A1c No results for input(s): HGBA1C in the last 72 hours. Lipid Profile No results for input(s): CHOL, HDL, LDLCALC, TRIG, CHOLHDL, LDLDIRECT in the last 72 hours. Thyroid function studies No results for input(s): TSH, T4TOTAL, T3FREE, THYROIDAB in the last 72 hours.  Invalid input(s): FREET3 Anemia work up No results for input(s): VITAMINB12, FOLATE, FERRITIN, TIBC, IRON, RETICCTPCT in the last 72 hours. Urinalysis    Component Value Date/Time   COLORURINE YELLOW 02/02/2017 1939   APPEARANCEUR CLOUDY (A) 02/02/2017 1939   LABSPEC 1.013 02/02/2017 1939   PHURINE 5.0 02/02/2017 1939   GLUCOSEU NEGATIVE 02/02/2017 1939   GLUCOSEU 100 (A) 01/26/2017 1114   HGBUR SMALL (A) 02/02/2017 1939   HGBUR large 04/26/2009 1110   BILIRUBINUR Negative 05/21/2017 1512   KETONESUR NEGATIVE 02/02/2017 1939   PROTEINUR 1+ 05/21/2017 1512   PROTEINUR 100 (A) 02/02/2017 1939   UROBILINOGEN negative (A) 05/21/2017 1512   UROBILINOGEN 0.2 01/26/2017 1114   NITRITE Negative 05/21/2017 1512   NITRITE NEGATIVE 02/02/2017 1939   LEUKOCYTESUR Negative 05/21/2017 1512   Sepsis Labs Invalid input(s): PROCALCITONIN,  WBC,  LACTICIDVEN Microbiology Recent Results (from the past 240 hour(s))  Urine Culture     Status: None   Collection Time: 05/21/17  3:55 PM  Result Value Ref Range Status   MICRO NUMBER: [id number]  Final   SPECIMEN QUALITY: ADEQUATE  Final   Sample Source URINE  Final   STATUS: FINAL  Final   Result: No Growth  Final     Time coordinating discharge: 35 minutes  SIGNED:  Aline August, MD  [facility] 05/27/2017, 9:21 AM Pager: [phone]  If 7PM-7AM, please contact night-coverage [email or web address] Password [email or web address]

## 2017-05-27 NOTE — Telephone Encounter (Signed)
Please advise on orders for inhaler.

## 2017-05-28 ENCOUNTER — Telehealth: Payer: Self-pay | Admitting: *Deleted

## 2017-05-28 NOTE — Telephone Encounter (Signed)
ARMENTHA BRANAGAN PGF:[id number] DOB: [birth year] DOA: 05/21/2017  PCP: Mosie Lukes, MD  Admit date: 05/21/2017 Discharge date: 05/27/2017  Admitted From: Home Disposition:  Home  Recommendations for Outpatient Follow-up:  1. Follow up with PCP in 1 week 2. Follow-up at cardiology/Dr. Kyla Balzarine office on 05/31/2017 at 10:30 AM for repeat INR check  Home Health: Yes Equipment/Devices: None  discharge Condition: Stable CODE STATUS: Full Diet recommendation: Heart Healthy   Transition Care Management Follow-up Telephone Call-Call done with son.   How have you been since you were released from the hospital? "She is doing better. She is home with her aide"   Do you understand why you were in the hospital? yes   Do you understand the discharge instructions? yes   Where were you discharged to? Home with aide   Items Reviewed:  Medications reviewed: yes  Allergies reviewed: yes  Dietary changes reviewed: yes  Referrals reviewed: yes   Functional Questionnaire:   Activities of Daily Living (ADLs):   She states they are independent in the following: aide stays with pt and assist as needed . Currently using walker.  States they require assistance with the following: bathing and hygiene, feeding, continence, grooming, toileting and dressing  Any transportation issues/concerns?: no  Any patient concerns? no   Confirmed importance and date/time of follow-up visits scheduled yes  Provider Appointment booked with Dr.Blyth 06/07/17  Confirmed with patient if condition begins to worsen call PCP or go to the ER.  Patient was given the office number and encouraged to call back with question or concerns.  : yes

## 2017-05-31 ENCOUNTER — Ambulatory Visit (INDEPENDENT_AMBULATORY_CARE_PROVIDER_SITE_OTHER): Payer: Medicare Other | Admitting: Pharmacist

## 2017-05-31 DIAGNOSIS — I4891 Unspecified atrial fibrillation: Secondary | ICD-10-CM | POA: Diagnosis not present

## 2017-05-31 DIAGNOSIS — Z5181 Encounter for therapeutic drug level monitoring: Secondary | ICD-10-CM

## 2017-05-31 DIAGNOSIS — I824Z2 Acute embolism and thrombosis of unspecified deep veins of left distal lower extremity: Secondary | ICD-10-CM

## 2017-05-31 LAB — POCT INR: INR: 5.9

## 2017-05-31 NOTE — Addendum Note (Signed)
Addended by: Erskine Emery on: 05/31/2017 11:27 AM   Modules accepted: Orders

## 2017-05-31 NOTE — Patient Instructions (Signed)
Hold dose today, tomorrow, then start taking half a tablet on Mondays, Wednesdays, and Fridays, then whole tablets on Sundays, Tuesdays, Thursdays, and Saturdays. Return to clinic on Monday. Call [phone] if any questions.

## 2017-06-02 ENCOUNTER — Ambulatory Visit: Payer: Self-pay | Admitting: *Deleted

## 2017-06-02 ENCOUNTER — Telehealth: Payer: Self-pay | Admitting: Cardiovascular Disease

## 2017-06-02 DIAGNOSIS — Z7902 Long term (current) use of antithrombotics/antiplatelets: Secondary | ICD-10-CM | POA: Diagnosis not present

## 2017-06-02 DIAGNOSIS — D509 Iron deficiency anemia, unspecified: Secondary | ICD-10-CM | POA: Diagnosis not present

## 2017-06-02 DIAGNOSIS — Z951 Presence of aortocoronary bypass graft: Secondary | ICD-10-CM | POA: Diagnosis not present

## 2017-06-02 DIAGNOSIS — I2510 Atherosclerotic heart disease of native coronary artery without angina pectoris: Secondary | ICD-10-CM | POA: Diagnosis not present

## 2017-06-02 DIAGNOSIS — K219 Gastro-esophageal reflux disease without esophagitis: Secondary | ICD-10-CM | POA: Diagnosis not present

## 2017-06-02 DIAGNOSIS — Z794 Long term (current) use of insulin: Secondary | ICD-10-CM | POA: Diagnosis not present

## 2017-06-02 DIAGNOSIS — I480 Paroxysmal atrial fibrillation: Secondary | ICD-10-CM | POA: Diagnosis not present

## 2017-06-02 DIAGNOSIS — I82402 Acute embolism and thrombosis of unspecified deep veins of left lower extremity: Secondary | ICD-10-CM | POA: Diagnosis not present

## 2017-06-02 DIAGNOSIS — Z7901 Long term (current) use of anticoagulants: Secondary | ICD-10-CM | POA: Diagnosis not present

## 2017-06-02 DIAGNOSIS — E785 Hyperlipidemia, unspecified: Secondary | ICD-10-CM | POA: Diagnosis not present

## 2017-06-02 DIAGNOSIS — J449 Chronic obstructive pulmonary disease, unspecified: Secondary | ICD-10-CM | POA: Diagnosis not present

## 2017-06-02 DIAGNOSIS — E1122 Type 2 diabetes mellitus with diabetic chronic kidney disease: Secondary | ICD-10-CM | POA: Diagnosis not present

## 2017-06-02 DIAGNOSIS — N184 Chronic kidney disease, stage 4 (severe): Secondary | ICD-10-CM | POA: Diagnosis not present

## 2017-06-02 DIAGNOSIS — I69393 Ataxia following cerebral infarction: Secondary | ICD-10-CM | POA: Diagnosis not present

## 2017-06-02 DIAGNOSIS — I129 Hypertensive chronic kidney disease with stage 1 through stage 4 chronic kidney disease, or unspecified chronic kidney disease: Secondary | ICD-10-CM | POA: Diagnosis not present

## 2017-06-02 NOTE — Telephone Encounter (Signed)
New message    Pt c/o BP issue: STAT if pt c/o blurred vision, one-sided weakness or slurred speech  1. What are your last 5 BP readings? 114/68, 84/52 HR 60, 90/50 HR30  2. Are you having any other symptoms (ex. Dizziness, headache, blurred vision, passed out)? dizziness  3. What is your BP issue? [facility] calling to report BP drop when patient stands up

## 2017-06-02 NOTE — Telephone Encounter (Signed)
[employer], physical therapist for [facility] called stating that the pt's blood pressure is low;these values were obtained 06/02/17 on left arm with manual cuff; 1205 114/68 HR 61 sitting 1207 84/52  HR 50 standing; she sat for 5 minutes and had the pt walk down the hall so that her gait could be evaluated;she got dizzy after that 90/51 HR 30; the pt was hospitalized  05/21/17-05/27/17; nurse triage initiated and recommendation made per protocol to include the pt going to ED; Amber verbalizes understanding and will proceed; spoke with [place] at [facility] and will route to office for notification of this encounter.  Reason for Disposition . [2] Fall in systolic BP > 20 mm Hg from normal AND [2] dizzy, lightheaded, or weak  Answer Assessment - Initial Assessment Questions 1. BLOOD PRESSURE: "What is the blood pressure?" "Did you take at least two measurements 5 minutes apart?"   06/02/17  1205: 114/68 sitting HR 61; 1207: 84/52 standing HR 50; 1220: 90/51 HR 30   2. ONSET: "When did you take your blood pressure?"  See above times    3. HOW: "How did you obtain the blood pressure?" (e.g., visiting nurse, automatic home BP monitor)    Manual cuff left arm per home health PT 4. HISTORY: "Do you have a history of low blood pressure?" "What is your blood pressure normally?"     unsure 5. MEDICATIONS: "Are you taking any medications for blood pressure?" If yes: "Have they been changed recently?"   Yes; metoprolol per cardiology 6. PULSE RATE: "Do you know what your pulse rate is?"      See above values 7. OTHER SYMPTOMS: "Have you been sick recently?" "Have you had a recent injury?"     Hospitalized 05/21/17-05/27/17 8. PREGNANCY: "Is there any chance you are pregnant?" "When was your last menstrual period?"     no  Protocols used: LOW BLOOD PRESSURE-A-AH

## 2017-06-02 NOTE — Telephone Encounter (Signed)
Pt triaged and instructed to go to ED per note. FYI to PCP.

## 2017-06-02 NOTE — Telephone Encounter (Signed)
Customer service manager with [facility]. Amber has called patient's PCP, and patient's PCP instructed patient to go to the ED. Informed Amber that she should follow the advisement of the PCP. Amber verbalized understanding and will have the patient go to the ED, per patient's PCP.

## 2017-06-03 ENCOUNTER — Telehealth: Payer: Self-pay | Admitting: *Deleted

## 2017-06-03 NOTE — Telephone Encounter (Signed)
Received Physician Orders from [facility]; forwarded to provider/SLS 03/21

## 2017-06-07 ENCOUNTER — Inpatient Hospital Stay (HOSPITAL_BASED_OUTPATIENT_CLINIC_OR_DEPARTMENT_OTHER)
Admission: EM | Admit: 2017-06-07 | Discharge: 2017-06-10 | DRG: 378 | Disposition: A | Discharge status: Home Health | Payer: Medicare Other | Attending: Infectious Disease | Admitting: Infectious Disease

## 2017-06-07 ENCOUNTER — Ambulatory Visit: Payer: Medicare Other | Admitting: Family Medicine

## 2017-06-07 ENCOUNTER — Encounter (HOSPITAL_BASED_OUTPATIENT_CLINIC_OR_DEPARTMENT_OTHER): Payer: Self-pay | Admitting: *Deleted

## 2017-06-07 ENCOUNTER — Encounter: Payer: Self-pay | Admitting: Family Medicine

## 2017-06-07 ENCOUNTER — Ambulatory Visit (INDEPENDENT_AMBULATORY_CARE_PROVIDER_SITE_OTHER): Payer: Medicare Other | Admitting: Cardiology

## 2017-06-07 ENCOUNTER — Other Ambulatory Visit: Payer: Self-pay

## 2017-06-07 DIAGNOSIS — Z5181 Encounter for therapeutic drug level monitoring: Secondary | ICD-10-CM

## 2017-06-07 DIAGNOSIS — F0390 Unspecified dementia without behavioral disturbance: Secondary | ICD-10-CM | POA: Diagnosis present

## 2017-06-07 DIAGNOSIS — D509 Iron deficiency anemia, unspecified: Secondary | ICD-10-CM | POA: Diagnosis not present

## 2017-06-07 DIAGNOSIS — K269 Duodenal ulcer, unspecified as acute or chronic, without hemorrhage or perforation: Secondary | ICD-10-CM | POA: Diagnosis not present

## 2017-06-07 DIAGNOSIS — E1122 Type 2 diabetes mellitus with diabetic chronic kidney disease: Secondary | ICD-10-CM | POA: Diagnosis not present

## 2017-06-07 DIAGNOSIS — N184 Chronic kidney disease, stage 4 (severe): Secondary | ICD-10-CM | POA: Diagnosis not present

## 2017-06-07 DIAGNOSIS — I824Z2 Acute embolism and thrombosis of unspecified deep veins of left distal lower extremity: Secondary | ICD-10-CM | POA: Diagnosis not present

## 2017-06-07 DIAGNOSIS — Z86718 Personal history of other venous thrombosis and embolism: Secondary | ICD-10-CM

## 2017-06-07 DIAGNOSIS — Z7901 Long term (current) use of anticoagulants: Secondary | ICD-10-CM

## 2017-06-07 DIAGNOSIS — K921 Melena: Secondary | ICD-10-CM | POA: Diagnosis not present

## 2017-06-07 DIAGNOSIS — K5730 Diverticulosis of large intestine without perforation or abscess without bleeding: Secondary | ICD-10-CM | POA: Diagnosis present

## 2017-06-07 DIAGNOSIS — I2510 Atherosclerotic heart disease of native coronary artery without angina pectoris: Secondary | ICD-10-CM | POA: Diagnosis present

## 2017-06-07 DIAGNOSIS — I82409 Acute embolism and thrombosis of unspecified deep veins of unspecified lower extremity: Secondary | ICD-10-CM

## 2017-06-07 DIAGNOSIS — E11319 Type 2 diabetes mellitus with unspecified diabetic retinopathy without macular edema: Secondary | ICD-10-CM | POA: Diagnosis present

## 2017-06-07 DIAGNOSIS — E119 Type 2 diabetes mellitus without complications: Secondary | ICD-10-CM | POA: Diagnosis not present

## 2017-06-07 DIAGNOSIS — Z794 Long term (current) use of insulin: Secondary | ICD-10-CM

## 2017-06-07 DIAGNOSIS — I480 Paroxysmal atrial fibrillation: Secondary | ICD-10-CM | POA: Diagnosis present

## 2017-06-07 DIAGNOSIS — I4891 Unspecified atrial fibrillation: Secondary | ICD-10-CM

## 2017-06-07 DIAGNOSIS — R42 Dizziness and giddiness: Secondary | ICD-10-CM | POA: Diagnosis not present

## 2017-06-07 DIAGNOSIS — K254 Chronic or unspecified gastric ulcer with hemorrhage: Secondary | ICD-10-CM | POA: Diagnosis not present

## 2017-06-07 DIAGNOSIS — E1151 Type 2 diabetes mellitus with diabetic peripheral angiopathy without gangrene: Secondary | ICD-10-CM | POA: Diagnosis not present

## 2017-06-07 DIAGNOSIS — Z8673 Personal history of transient ischemic attack (TIA), and cerebral infarction without residual deficits: Secondary | ICD-10-CM

## 2017-06-07 DIAGNOSIS — K625 Hemorrhage of anus and rectum: Secondary | ICD-10-CM

## 2017-06-07 DIAGNOSIS — K5792 Diverticulitis of intestine, part unspecified, without perforation or abscess without bleeding: Secondary | ICD-10-CM | POA: Diagnosis not present

## 2017-06-07 DIAGNOSIS — D500 Iron deficiency anemia secondary to blood loss (chronic): Secondary | ICD-10-CM | POA: Diagnosis present

## 2017-06-07 DIAGNOSIS — N390 Urinary tract infection, site not specified: Secondary | ICD-10-CM | POA: Diagnosis not present

## 2017-06-07 DIAGNOSIS — D6832 Hemorrhagic disorder due to extrinsic circulating anticoagulants: Secondary | ICD-10-CM | POA: Diagnosis present

## 2017-06-07 DIAGNOSIS — K2970 Gastritis, unspecified, without bleeding: Secondary | ICD-10-CM | POA: Diagnosis not present

## 2017-06-07 DIAGNOSIS — R001 Bradycardia, unspecified: Secondary | ICD-10-CM | POA: Diagnosis not present

## 2017-06-07 DIAGNOSIS — K2990 Gastroduodenitis, unspecified, without bleeding: Secondary | ICD-10-CM | POA: Diagnosis present

## 2017-06-07 DIAGNOSIS — E1142 Type 2 diabetes mellitus with diabetic polyneuropathy: Secondary | ICD-10-CM | POA: Diagnosis not present

## 2017-06-07 DIAGNOSIS — G709 Myoneural disorder, unspecified: Secondary | ICD-10-CM | POA: Diagnosis present

## 2017-06-07 DIAGNOSIS — R791 Abnormal coagulation profile: Secondary | ICD-10-CM | POA: Diagnosis not present

## 2017-06-07 DIAGNOSIS — I82401 Acute embolism and thrombosis of unspecified deep veins of right lower extremity: Secondary | ICD-10-CM | POA: Diagnosis not present

## 2017-06-07 DIAGNOSIS — Z951 Presence of aortocoronary bypass graft: Secondary | ICD-10-CM | POA: Diagnosis not present

## 2017-06-07 DIAGNOSIS — I129 Hypertensive chronic kidney disease with stage 1 through stage 4 chronic kidney disease, or unspecified chronic kidney disease: Secondary | ICD-10-CM | POA: Diagnosis present

## 2017-06-07 DIAGNOSIS — F329 Major depressive disorder, single episode, unspecified: Secondary | ICD-10-CM | POA: Diagnosis present

## 2017-06-07 DIAGNOSIS — D689 Coagulation defect, unspecified: Secondary | ICD-10-CM | POA: Diagnosis not present

## 2017-06-07 DIAGNOSIS — K219 Gastro-esophageal reflux disease without esophagitis: Secondary | ICD-10-CM | POA: Diagnosis present

## 2017-06-07 DIAGNOSIS — M1990 Unspecified osteoarthritis, unspecified site: Secondary | ICD-10-CM | POA: Diagnosis not present

## 2017-06-07 DIAGNOSIS — Z8711 Personal history of peptic ulcer disease: Secondary | ICD-10-CM | POA: Diagnosis not present

## 2017-06-07 DIAGNOSIS — J449 Chronic obstructive pulmonary disease, unspecified: Secondary | ICD-10-CM | POA: Diagnosis not present

## 2017-06-07 DIAGNOSIS — I10 Essential (primary) hypertension: Secondary | ICD-10-CM

## 2017-06-07 DIAGNOSIS — Z87891 Personal history of nicotine dependence: Secondary | ICD-10-CM

## 2017-06-07 DIAGNOSIS — E785 Hyperlipidemia, unspecified: Secondary | ICD-10-CM | POA: Diagnosis not present

## 2017-06-07 DIAGNOSIS — Z79899 Other long term (current) drug therapy: Secondary | ICD-10-CM

## 2017-06-07 DIAGNOSIS — K264 Chronic or unspecified duodenal ulcer with hemorrhage: Secondary | ICD-10-CM | POA: Diagnosis present

## 2017-06-07 DIAGNOSIS — K922 Gastrointestinal hemorrhage, unspecified: Secondary | ICD-10-CM | POA: Diagnosis not present

## 2017-06-07 DIAGNOSIS — K284 Chronic or unspecified gastrojejunal ulcer with hemorrhage: Secondary | ICD-10-CM | POA: Diagnosis not present

## 2017-06-07 LAB — BASIC METABOLIC PANEL
Anion gap: 8 (ref 5–15)
BUN: 62 mg/dL — AB (ref 6–20)
CHLORIDE: 110 mmol/L (ref 101–111)
CO2: 19 mmol/L — AB (ref 22–32)
Calcium: 9 mg/dL (ref 8.9–10.3)
Creatinine, Ser: 2.07 mg/dL — ABNORMAL HIGH (ref 0.44–1.00)
GFR calc Af Amer: 24 mL/min — ABNORMAL LOW (ref >60)
GFR calc non Af Amer: 21 mL/min — ABNORMAL LOW (ref >60)
Glucose, Bld: 133 mg/dL — ABNORMAL HIGH (ref 65–99)
POTASSIUM: 4.6 mmol/L (ref 3.5–5.1)
Sodium: 137 mmol/L (ref 135–145)

## 2017-06-07 LAB — OCCULT BLOOD X 1 CARD TO LAB, STOOL: Fecal Occult Bld: NEGATIVE

## 2017-06-07 LAB — CBC WITH DIFFERENTIAL/PLATELET
Basophils Absolute: 0 10*3/uL (ref 0.0–0.1)
Basophils Relative: 0 %
Eosinophils Absolute: 0.3 10*3/uL (ref 0.0–0.7)
Eosinophils Relative: 3 %
HCT: 33.9 % — ABNORMAL LOW (ref 36.0–46.0)
HEMOGLOBIN: 11.6 g/dL — AB (ref 12.0–15.0)
LYMPHS ABS: 2.4 10*3/uL (ref 0.7–4.0)
Lymphocytes Relative: 29 %
MCH: 31 pg (ref 26.0–34.0)
MCHC: 34.2 g/dL (ref 30.0–36.0)
MCV: 90.6 fL (ref 78.0–100.0)
MONOS PCT: 9 %
Monocytes Absolute: 0.8 10*3/uL (ref 0.1–1.0)
NEUTROS PCT: 59 %
Neutro Abs: 4.7 10*3/uL (ref 1.7–7.7)
Platelets: 217 10*3/uL (ref 150–400)
RBC: 3.74 MIL/uL — AB (ref 3.87–5.11)
RDW: 13 % (ref 11.5–15.5)
WBC: 8.2 10*3/uL (ref 4.0–10.5)

## 2017-06-07 LAB — HEMOGLOBIN: Hemoglobin: 12 g/dL (ref 12.0–15.0)

## 2017-06-07 LAB — POCT INR: INR: 3.6

## 2017-06-07 LAB — PROTIME-INR
INR: 3.31
INR: 1.56
PROTHROMBIN TIME: 18.5 s — AB (ref 11.4–15.2)
Prothrombin Time: 33.4 seconds — ABNORMAL HIGH (ref 11.4–15.2)

## 2017-06-07 MED ORDER — PROTHROMBIN COMPLEX CONC HUMAN 500 UNITS IV KIT
1662.0000 [IU] | PACK | Freq: Once | Status: AC
  Administered 2017-06-07: 1662 [IU] via INTRAVENOUS
  Filled 2017-06-07: qty 1662

## 2017-06-07 MED ORDER — PROTHROMBIN COMPLEX CONC HUMAN 500 UNITS IV KIT
1653.0000 [IU] | PACK | Freq: Once | INTRAVENOUS | Status: DC
  Filled 2017-06-07: qty 1653

## 2017-06-07 MED ORDER — NITROGLYCERIN 0.4 MG SL SUBL: 0.4000 mg | SUBLINGUAL_TABLET | SUBLINGUAL | Status: DC | PRN

## 2017-06-07 MED ORDER — ISOSORBIDE MONONITRATE ER 30 MG PO TB24
30.0000 mg | ORAL_TABLET | Freq: Every day | ORAL | Status: DC
  Administered 2017-06-07 – 2017-06-09 (×3): 30 mg via ORAL
  Filled 2017-06-07 (×4): qty 1

## 2017-06-07 MED ORDER — SODIUM CHLORIDE 0.9 % IV BOLUS
1000.0000 mL | Freq: Once | INTRAVENOUS | Status: AC
  Administered 2017-06-07: 1000 mL via INTRAVENOUS

## 2017-06-07 MED ORDER — ALBUTEROL SULFATE HFA 108 (90 BASE) MCG/ACT IN AERS: 2.0000 | INHALATION_SPRAY | Freq: Four times a day (QID) | RESPIRATORY_TRACT | Status: DC | PRN

## 2017-06-07 MED ORDER — IPRATROPIUM-ALBUTEROL 0.5-2.5 (3) MG/3ML IN SOLN
3.0000 mL | Freq: Four times a day (QID) | RESPIRATORY_TRACT | Status: DC | PRN
  Administered 2017-06-09: 3 mL via RESPIRATORY_TRACT
  Filled 2017-06-07: qty 3

## 2017-06-07 MED ORDER — CITALOPRAM HYDROBROMIDE 20 MG PO TABS
20.0000 mg | ORAL_TABLET | Freq: Every day | ORAL | Status: DC
  Administered 2017-06-08 – 2017-06-10 (×3): 20 mg via ORAL
  Filled 2017-06-07 (×3): qty 1

## 2017-06-07 MED ORDER — PANTOPRAZOLE SODIUM 40 MG IV SOLR
40.0000 mg | Freq: Two times a day (BID) | INTRAVENOUS | Status: DC
  Administered 2017-06-07 – 2017-06-08 (×3): 40 mg via INTRAVENOUS
  Filled 2017-06-07 (×4): qty 40

## 2017-06-07 MED ORDER — VITAMIN K1 10 MG/ML IJ SOLN
10.0000 mg | Freq: Once | INTRAVENOUS | Status: DC
  Filled 2017-06-07: qty 1

## 2017-06-07 MED ORDER — IPRATROPIUM BROMIDE HFA 17 MCG/ACT IN AERS: 2.0000 | INHALATION_SPRAY | Freq: Four times a day (QID) | RESPIRATORY_TRACT | Status: DC | PRN

## 2017-06-07 MED ORDER — SODIUM CHLORIDE 0.9 % IV SOLN
INTRAVENOUS | Status: DC
  Administered 2017-06-07: 21:00:00 via INTRAVENOUS

## 2017-06-07 MED ORDER — ATORVASTATIN CALCIUM 20 MG PO TABS
20.0000 mg | ORAL_TABLET | Freq: Every day | ORAL | Status: DC
  Administered 2017-06-08 – 2017-06-10 (×3): 20 mg via ORAL
  Filled 2017-06-07 (×3): qty 1

## 2017-06-07 MED ORDER — VITAMIN K1 10 MG/ML IJ SOLN
INTRAMUSCULAR | Status: AC
  Filled 2017-06-07: qty 1

## 2017-06-07 MED ORDER — METOPROLOL TARTRATE 25 MG PO TABS
12.5000 mg | ORAL_TABLET | Freq: Two times a day (BID) | ORAL | Status: DC
  Filled 2017-06-07 (×2): qty 1

## 2017-06-07 MED ORDER — ZOLPIDEM TARTRATE 5 MG PO TABS: 5.0000 mg | ORAL_TABLET | Freq: Every evening | ORAL | Status: DC | PRN

## 2017-06-07 MED ORDER — SODIUM CHLORIDE 0.9 % IV BOLUS
500.0000 mL | Freq: Once | INTRAVENOUS | Status: AC
Start: 2017-06-07 — End: 2017-06-07
  Administered 2017-06-07: 500 mL via INTRAVENOUS

## 2017-06-07 MED ORDER — SODIUM CHLORIDE 0.9 % IV SOLN
INTRAVENOUS | Status: DC
  Administered 2017-06-08: 10:00:00 via INTRAVENOUS

## 2017-06-07 NOTE — Assessment & Plan Note (Addendum)
Patient was placed on Coumadin on May 21, 2017 secondary to a left lower extremity DVT.  One week ago today she was tested and her INR was 5.9.  Her Coumadin was at 5 mg daily and is been held since then.  Her INR today was 3.6.  Unfortunately last night she had 3 uncontrollable bowel movements which were diarrheal and dark.  This is a new development for her.  She is also significantly weak and ataxic today which is unusual for her as well.  Hemoccult via rectal exam in the office is positive given her high risk for excessive bleeding she is sent to the emergency room for further evaluation. Family is notified and aide is with her and accompanies her downstais

## 2017-06-07 NOTE — ED Notes (Signed)
Pt. Said she had an explosive BM yesterday and it was bloody and she kept having blood in her bed on the towels under her.

## 2017-06-07 NOTE — Assessment & Plan Note (Signed)
Well controlled, no changes to meds. Encouraged heart healthy diet such as the DASH diet and exercise as tolerated.  °

## 2017-06-07 NOTE — ED Notes (Signed)
Pt on monitor 

## 2017-06-07 NOTE — Assessment & Plan Note (Signed)
Was diagnosed with left DVT on 3/8 and placed on Coumadin

## 2017-06-07 NOTE — Progress Notes (Signed)
Patient is a 35 old female with past medical history of hypertension, hyperlipidemia, recent DVT who presented to the [facility] with complaints of lower GI bleed.  Patient was bleeding profusely from her rectum with clots.  Patient was recently diagnosed with right lower extremity DVT and was started on warfarin about a week ago.  Her INR was 3.6 on presentation. Patient was given Wandalee Ferdinand and her INR got reversed to 1.5.  Patient's current hemoglobin is 11.6. Requested for direct admission at [facility] long.  Currently she is hemodynamically stable.  I have requested the provider there to call for  GI consult and will follow up with GI when the patient comes here.  Patient might need filter placement for DVT as she has had headaches for GI bleed if she is on anticoagulation.

## 2017-06-07 NOTE — ED Notes (Signed)
Pt. Reports she had multiple bloody towels under her upon waking today after her BM last night.  Pt. In no distress today..  Pt. At times confused with son at bedside reporting to RN that she is at her baseline.

## 2017-06-07 NOTE — Assessment & Plan Note (Signed)
Does not complain of urinary symptoms but does have frequent UTIs which have resulted in weakness and ataxia in the past

## 2017-06-07 NOTE — ED Notes (Signed)
Placed Pt. On 2 liters oxygen just for HR of 40 and PT INR on 33.4

## 2017-06-07 NOTE — ED Triage Notes (Signed)
She was seen by her MD today for bright red rectal bleeding that started last night. She was told to come to the ED for further testing.

## 2017-06-07 NOTE — ED Notes (Signed)
Per Son of Pt. The pt has had rectal bleeding in past history due to medicine related and ulcer.  Pt has been on coumadin and result of A Fib.   Pt. Has had blood clot in leg Left lower.

## 2017-06-07 NOTE — ED Provider Notes (Signed)
446 Case d/w Dr. Ardis Hughs of GI who will plan to see the patient in the am if the hospitalists have any problems consult on call doctor.    Winton Offord, MD 06/07/17 1646

## 2017-06-07 NOTE — Patient Instructions (Signed)
Rectal Bleeding Rectal bleeding is when blood passes out of the anus. People with rectal bleeding may notice bright red blood in their underwear or in the toilet after having a bowel movement. They may also have dark red or black stools. Rectal bleeding is usually a sign that something is wrong. Many things can cause rectal bleeding, including:  Hemorrhoids. These are blood vessels in the anus or rectum that are larger than normal.  Fistulas. These are abnormal passages in the rectum and anus.  Anal fissures. This is a tear in the anus.  Diverticulosis. This is a condition in which pockets or sacs project from the bowel.  Proctitis and colitis. These are conditions in which the rectum, colon, or anus become inflamed.  Polyps. These are growths that can be cancerous (malignant) or non-cancerous (benign).  Part of the rectum sticking out from the anus (rectal prolapse).  Certain medicines.  Intestinal infections.  Follow these instructions at home: Pay attention to any changes in your symptoms. Take these actions to help lessen bleeding and discomfort:  Eat a diet that is high in fiber. This will keep your stool soft, making it easier to pass stools without straining. Ask your health care provider what foods and drinks are high in fiber.  Drink enough fluid to keep your urine clear or pale yellow. This also helps to keep your stool soft.  Try taking a warm bath. This may help soothe any pain in your rectum.  Keep all follow-up visits as told by your health care provider. This is important.  Get help right away if:  You have new or increased rectal bleeding.  You have black or dark red stools.  You vomit blood or something that looks like coffee grounds.  You have pain or tenderness in your abdomen.  You have a fever.  You feel weak.  You feel nauseous.  You faint.  You have severe pain in your rectum.  You cannot have a bowel movement. This information is not  intended to replace advice given to you by your health care provider. Make sure you discuss any questions you have with your health care provider. Document Released: 08/22/2001 Document Revised: 08/08/2015 Document Reviewed: 04/28/2015 Elsevier Interactive Patient Education  [employer].

## 2017-06-07 NOTE — Assessment & Plan Note (Signed)
Patient is on iron

## 2017-06-07 NOTE — ED Notes (Signed)
Family at bedside. 

## 2017-06-07 NOTE — Progress Notes (Signed)
Subjective:  I acted as a Education administrator for Destiny [name]. Destiny [name], Destiny [name]   Patient ID: Destiny [name], female    DOB: 1932/02/22, 84 y.o.   MRN: [id number]  Chief Complaint  Patient presents with  . Hospitalization Follow-up    HPI  Patient is in today for hospital follow up.  She has been in the hospital with a sudden onset of a left DVT back on March 8.  She had been very weak and ataxic at that time.  With treatment of the DVT with Coumadin she had been doing better to the last 24 hours.  Last week her INR was 5.9 so we have been holding her Coumadin since then.  Her Coumadin check INR today was 3.6.  Unfortunately her care provider set up this morning to get her to bring her to the appointment and they noted significant weakness again and a very unsteady gait.  Patient also had 3 uncontrollable bowel movements last night that were black and diarrheal.  This is unusual for her.  She denies any other acute complaints such as urinary concerns fevers, abdominal pain or chest pain.  Patient Care Team: Destiny Lukes, MD as PCP - General (Family Medicine)   Past Medical History:  Diagnosis Date  . Acute renal insufficiency 12/24/2013  . Amaurosis fugax   . Arthritis 12/06/2012  . Atherosclerosis   . CAD (coronary artery disease)    a. s/p CABG x 4 (LIMA->LAD, VG->Diag, VG->OM1->OM3);  b. 2010 Cath: 3/4 patent grafts (VG->diag occluded), 3vd, sev PVD ->Med Rx;  c. 12/2011 Lexi MV: sm area of mild mid and apical ant wall ischemia ->med rx.  . Carotid bruit   . Chronic kidney disease (CKD) stage G4/A1, severely decreased glomerular filtration rate (GFR) between 15-29 mL/min/1.73 square meter and albuminuria creatinine ratio less than 30 mg/g (HCC)    GFR 25 on 12/25/13  . CVA (cerebral infarction)   . Dementia    pt denies  . Depression 12/24/2013  . Diabetes mellitus type II    insulin dependent.   . Diabetic peripheral neuropathy ([place])   . Diabetic retinopathy   . Diverticulosis   . Duodenal  ulcer 2009 and 11/2011   caused GI bleeding  . Gallstones 2009   seen on CT scan  . GI bleed 04/2011, 11/2011   found non-bleeding duodenal ulcers  . Hyperlipidemia   . Hypersomnolence 03/12/2013  . Hypertension   . Iron deficiency anemia secondary to blood loss (chronic)   . Melena 1015/2015   PEPTIC ULCER    REQUIRING TRANSFUSION  . Pedal edema 10/12/2016  . PVD (peripheral vascular disease) ([place])   . Rib fracture 04/21/2015  . Thiamine deficiency 08/13/2015  . UTI (urinary tract infection) 04/08/2014  . Weight loss, non-intentional 01/29/2014    Past Surgical History:  Procedure Laterality Date  . ABDOMINAL HYSTERECTOMY    . COLONOSCOPY  05/20/2011   Procedure: COLONOSCOPY;  Surgeon: Destiny Shorts, MD;  Location: [place];  Service: Endoscopy;  Laterality: N/A;  . CORONARY ARTERY BYPASS GRAFT  1999   x 4  . ENTEROSCOPY N/A 12/29/2013   Procedure: ENTEROSCOPY;  Surgeon: Destiny Castle, MD;  Location: [place];  Service: Endoscopy;  Laterality: N/A;  . ESOPHAGOGASTRODUODENOSCOPY  05/16/2011   Procedure: ESOPHAGOGASTRODUODENOSCOPY (EGD);  Surgeon: Destiny Mayer, MD;  Location[facility] ENDOSCOPY;  Service: Endoscopy;  Laterality: N/A;  . ESOPHAGOGASTRODUODENOSCOPY  12/08/2011   Procedure: ESOPHAGOGASTRODUODENOSCOPY (EGD);  Surgeon: Destiny Artist, MD,FACG;  Location: [facility] ENDOSCOPY;  Service: Endoscopy;  Laterality: N/A;  . UPPER GASTROINTESTINAL ENDOSCOPY  02/22/2008   w/biopsy, duedenal ulcer, antrum erosions    Family History  Problem Relation Age of Onset  . Coronary artery disease Father   . Hypertension Father   . Stroke Father   . Cancer Mother        ?  . Diabetes Maternal Uncle   . Stroke Sister   . Colon cancer Neg Hx     Social History   Socioeconomic History  . Marital status: Widowed    Spouse name: Not on file  . Number of children: 4  . Years of education: Not on file  . Highest education level: Not on file  Occupational History  . Occupation: retired     Fish farm manager: RETIRED  Social Needs  . Financial resource strain: Not on file  . Food insecurity:    Worry: Not on file    Inability: Not on file  . Transportation needs:    Medical: Not on file    Non-medical: Not on file  Tobacco Use  . Smoking status: Former Smoker    Types: Cigarettes    Last attempt to quit: 12/31/2000    Years since quitting: 16.4  . Smokeless tobacco: Never Used  Substance and Sexual Activity  . Alcohol use: No  . Drug use: No  . Sexual activity: Not on file  Lifestyle  . Physical activity:    Days per week: Not on file    Minutes per session: Not on file  . Stress: Not on file  Relationships  . Social connections:    Talks on phone: Not on file    Gets together: Not on file    Attends religious service: Not on file    Active member of club or organization: Not on file    Attends meetings of clubs or organizations: Not on file    Relationship status: Not on file  . Intimate partner violence:    Fear of current or ex partner: Not on file    Emotionally abused: Not on file    Physically abused: Not on file    Forced sexual activity: Not on file  Other Topics Concern  . Not on file  Social History Narrative   Widowed - husband passed away in 2009/05/25   lives with daughter with epilepsy - mental retardation   Retired     Former Smoker      Alcohol use-no          Occupation: Retired       son - Destiny [name]     Outpatient Medications Prior to Visit  Medication Sig Dispense Refill  . albuterol (PROAIR HFA) 108 (90 Base) MCG/ACT inhaler Inhale 2 puffs into the lungs every 6 (six) hours as needed for wheezing or shortness of breath. 18 g 2  . amLODipine (NORVASC) 5 MG tablet Take 1 tablet (5 mg total) by mouth daily. 90 tablet 1  . atorvastatin (LIPITOR) 20 MG tablet Take 1 tablet (20 mg total) by mouth daily. 90 tablet 1  . Cholecalciferol (VITAMIN D) 1000 UNITS capsule Take 1,000 Units by mouth every evening.     . citalopram (CELEXA) 20 MG tablet  Take 1 tablet (20 mg total) by mouth daily. 90 tablet 1  . ferrous sulfate 325 (65 FE) MG tablet Take 325 mg daily with breakfast by mouth.    . Insulin Glargine (LANTUS SOLOSTAR) 100 UNIT/ML Solostar Pen ADMINISTER 35 UNITS UNDER THE SKIN  DAILY AT in the morning. INCREASE BY 2 UNITS EVERY 3 DAYS IF ALL FSG>120 15 mL 2  . insulin lispro (HUMALOG KWIKPEN) 100 UNIT/ML KiwkPen Inject 0.05-0.1 mLs (5-10 Units total) into the skin 3 (three) times daily with meals. 15 mL 4  . ipratropium (ATROVENT HFA) 17 MCG/ACT inhaler Inhale 2 puffs into the lungs every 6 (six) hours as needed for wheezing. 1 Inhaler 12  . isosorbide mononitrate (IMDUR) 30 MG 24 hr tablet Take 1 tablet (30 mg total) by mouth at bedtime. (Patient taking differently: Take 30 mg by mouth daily with supper. ) 90 tablet 1  . isosorbide mononitrate (IMDUR) 60 MG 24 hr tablet TAKE 60 mg TABLET BY MOUTH EVERY DAY in the morning 90 tablet 0  . metoprolol tartrate (LOPRESSOR) 25 MG tablet Take 0.5 tablets (12.5 mg total) by mouth 2 (two) times daily. 30 tablet 0  . nitroGLYCERIN (NITROSTAT) 0.4 MG SL tablet Place 0.4 mg under the tongue every 5 (five) minutes as needed for chest pain.    . Omega-3 Fatty Acids (FISH OIL) 1200 MG CAPS Take 1,200 mg by mouth daily.     . pantoprazole (PROTONIX) 40 MG tablet Take 40 mg by mouth every Monday, Wednesday, and Friday.    . ranitidine (ZANTAC) 300 MG tablet Take 1/2 tablet at bedtime 45 tablet 3  . thiamine (VITAMIN B-1) 100 MG tablet Take 1 tablet (100 mg total) by mouth daily. (Patient taking differently: Take 100 mg See admin instructions by mouth. Monday, Wednesday and Friday) 30 tablet 5  . triamterene-hydrochlorothiazide (MAXZIDE-25) 37.5-25 MG tablet Take 0.5 tablets by mouth daily. 15 tablet 3  . warfarin (COUMADIN) 5 MG tablet Take 1 tablet (5 mg total) by mouth daily. 30 tablet 0   No facility-administered medications prior to visit.     No Known Allergies  Review of Systems    Constitutional: Positive for malaise/fatigue. Negative for fever.  HENT: Negative for congestion.   Eyes: Negative for blurred vision.  Respiratory: Negative for shortness of breath.   Cardiovascular: Negative for chest pain, palpitations and leg swelling.  Gastrointestinal: Positive for diarrhea and melena. Negative for abdominal pain, blood in stool and nausea.  Genitourinary: Negative for dysuria and frequency.  Musculoskeletal: Negative for falls.  Skin: Negative for rash.  Neurological: Positive for weakness. Negative for dizziness, loss of consciousness and headaches.  Endo/Heme/Allergies: Negative for environmental allergies.  Psychiatric/Behavioral: Negative for depression. The patient is not nervous/anxious.        Objective:    Physical Exam  Constitutional: She is oriented to person, place, and time. She appears well-developed and well-nourished. No distress.  HENT:  Head: Normocephalic and atraumatic.  Nose: Nose normal.  Eyes: Right eye exhibits no discharge. Left eye exhibits no discharge.  Neck: Normal range of motion. Neck supple.  Cardiovascular:  No murmur heard. bradycardia  Pulmonary/Chest: Effort normal and breath sounds normal.  Abdominal: Soft. Bowel sounds are normal. There is no tenderness.  Musculoskeletal: She exhibits no edema.  Neurological: She is alert and oriented to person, place, and time.  Skin: Skin is warm and dry.  Psychiatric: She has a normal mood and affect.  Nursing note and vitals reviewed.   BP 140/62 (BP Location: Left Arm, Patient Position: Sitting, Cuff Size: Normal)   Pulse (!) 52   Temp 97.7 F (36.5 C) (Oral)   Resp 16   Ht 5' 6.14" (1.68 m)   Wt 159 lb 6.4 oz (72.3 kg)   SpO2 97%  BMI 25.62 kg/m  Wt Readings from Last 3 Encounters:  06/07/17 159 lb 6.4 oz (72.3 kg)  05/22/17 164 lb 3.9 oz (74.5 kg)  05/21/17 163 lb 3.2 oz (74 kg)   BP Readings from Last 3 Encounters:  06/07/17 140/62  05/27/17 (!) 145/74   05/21/17 (!) 141/71     Immunization History  Administered Date(s) Administered  . Influenza Split 12/07/2011  . Influenza Whole 03/28/2007, 01/09/2008, 12/25/2008, 11/27/2009  . Influenza, High Dose Seasonal PF 01/05/2015, 11/26/2015  . Influenza,inj,Quad PF,6+ Mos 12/06/2012, 12/29/2013  . Influenza-Unspecified 01/05/2015, 12/26/2016  . Pneumococcal Polysaccharide-23 01/10/2008, 11/15/2012  . Td 07/04/2007    Health Maintenance  Topic Date Due  . DEXA SCAN  04/17/1998  . PNA vac Low Risk Adult (2 of 2 - PCV13) 11/15/2013  . OPHTHALMOLOGY EXAM  03/18/2016  . URINE MICROALBUMIN  11/25/2016  . TETANUS/TDAP  07/03/2017  . FOOT EXAM  07/06/2017  . HEMOGLOBIN A1C  08/02/2017  . INFLUENZA VACCINE  Completed    Lab Results  Component Value Date   WBC 9.2 05/27/2017   HGB 11.6 (L) 05/27/2017   HCT 34.5 (L) 05/27/2017   PLT 171 05/27/2017   GLUCOSE 153 (H) 05/27/2017   CHOL 167 02/02/2017   TRIG 272 (H) 02/02/2017   HDL 36 (L) 02/02/2017   LDLDIRECT 128.0 01/26/2017   LDLCALC 77 02/02/2017   ALT 15 05/21/2017   AST 13 05/21/2017   NA 136 05/27/2017   K 4.1 05/27/2017   CL 104 05/27/2017   CREATININE 2.10 (H) 05/27/2017   BUN 48 (H) 05/27/2017   CO2 20 (L) 05/27/2017   TSH 2.03 01/26/2017   INR 3.6 06/07/2017   HGBA1C 8.4 (H) 02/02/2017   MICROALBUR 97.4 (H) 11/26/2015    Lab Results  Component Value Date   TSH 2.03 01/26/2017   Lab Results  Component Value Date   WBC 9.2 05/27/2017   HGB 11.6 (L) 05/27/2017   HCT 34.5 (L) 05/27/2017   MCV 92.2 05/27/2017   PLT 171 05/27/2017   Lab Results  Component Value Date   NA 136 05/27/2017   K 4.1 05/27/2017   CO2 20 (L) 05/27/2017   GLUCOSE 153 (H) 05/27/2017   BUN 48 (H) 05/27/2017   CREATININE 2.10 (H) 05/27/2017   BILITOT 0.9 05/21/2017   ALKPHOS 72 05/21/2017   AST 13 05/21/2017   ALT 15 05/21/2017   PROT 6.8 05/21/2017   ALBUMIN 3.8 05/21/2017   CALCIUM 9.0 05/27/2017   ANIONGAP 12 05/27/2017   GFR  28.30 (L) 05/21/2017   Lab Results  Component Value Date   CHOL 167 02/02/2017   Lab Results  Component Value Date   HDL 36 (L) 02/02/2017   Lab Results  Component Value Date   LDLCALC 77 02/02/2017   Lab Results  Component Value Date   TRIG 272 (H) 02/02/2017   Lab Results  Component Value Date   CHOLHDL 4.6 02/02/2017   Lab Results  Component Value Date   HGBA1C 8.4 (H) 02/02/2017         Assessment & Plan:   Problem List Items Addressed This Visit    Essential hypertension (Chronic)    Well controlled, no changes to meds. Encouraged heart healthy diet such as the DASH diet and exercise as tolerated.       Diabetes mellitus type 2, insulin dependent (HCC) (Chronic)    minimize simple carbs. Increase exercise as tolerated.       Anemia  Patient is on iron      UTI (urinary tract infection)    Does not complain of urinary symptoms but does have frequent UTIs which have resulted in weakness and ataxia in the past      DVT (deep venous thrombosis) ([place])    Was diagnosed with left DVT on 3/8 and placed on Coumadin      Anticoagulated on Coumadin    Patient was placed on Coumadin on May 21, 2017 secondary to a left lower extremity DVT.  One week ago today she was tested and her INR was 5.9.  Her Coumadin was at 5 mg daily and is been held since then.  Her INR today was 3.6.  Unfortunately last night she had 3 uncontrollable bowel movements which were diarrheal and dark.  This is a new development for her.  She is also significantly weak and ataxic today which is unusual for her as well.  Hemoccult via rectal exam in the office is positive given her high risk for excessive bleeding she is sent to the emergency room for further evaluation. Family is notified and aide is with her and accompanies her downstais         I am having Destiny [name] maintain her Vitamin D, Fish Oil, nitroGLYCERIN, thiamine, insulin lispro, ferrous sulfate, ranitidine, Insulin  Glargine, triamterene-hydrochlorothiazide, amLODipine, atorvastatin, citalopram, isosorbide mononitrate, isosorbide mononitrate, ipratropium, pantoprazole, albuterol, warfarin, and metoprolol tartrate.  No orders of the defined types were placed in this encounter.   CMA served as Education administrator during this visit. History, Physical and Plan performed by medical provider. Documentation and orders reviewed and attested to.  Penni Homans, MD

## 2017-06-07 NOTE — Assessment & Plan Note (Signed)
minimize simple carbs. Increase exercise as tolerated.  

## 2017-06-07 NOTE — ED Provider Notes (Addendum)
[facility] EMERGENCY DEPARTMENT Provider Note   CSN: [id number] Arrival date & time: 06/07/17  1312     History   Chief Complaint Chief Complaint  Patient presents with  . Rectal Bleeding    HPI Destiny [name] is a 84 y.o. female.   Rectal Bleeding  Quality:  Black and tarry and bright red Amount:  Copious Timing:  Rare Chronicity:  New Context: not anal fissures, not constipation and not foreign body   Relieved by:  Nothing Worsened by:  Nothing Ineffective treatments:  None tried Associated symptoms: dizziness and light-headedness   Associated symptoms: no abdominal pain, no fever and no vomiting   Risk factors: anticoagulant use   Family reports explosive BRBPR and some melena last night and has been off balance.  Family reports cleaning up bed linens and towels covered in blood.    Past Medical History:  Diagnosis Date  . Acute renal insufficiency 12/24/2013  . Amaurosis fugax   . Arthritis 12/06/2012  . Atherosclerosis   . CAD (coronary artery disease)    a. s/p CABG x 4 (LIMA->LAD, VG->Diag, VG->OM1->OM3);  b. 2010 Cath: 3/4 patent grafts (VG->diag occluded), 3vd, sev PVD ->Med Rx;  c. 12/2011 Lexi MV: sm area of mild mid and apical ant wall ischemia ->med rx.  . Carotid bruit   . Chronic kidney disease (CKD) stage G4/A1, severely decreased glomerular filtration rate (GFR) between 15-29 mL/min/1.73 square meter and albuminuria creatinine ratio less than 30 mg/g (HCC)    GFR 25 on 12/25/13  . CVA (cerebral infarction)   . Dementia    pt denies  . Depression 12/24/2013  . Diabetes mellitus type II    insulin dependent.   . Diabetic peripheral neuropathy ([place])   . Diabetic retinopathy   . Diverticulosis   . Duodenal ulcer 2009 and 11/2011   caused GI bleeding  . Gallstones 2009   seen on CT scan  . GI bleed 04/2011, 11/2011   found non-bleeding duodenal ulcers  . Hyperlipidemia   . Hypersomnolence 03/12/2013  . Hypertension   . Iron  deficiency anemia secondary to blood loss (chronic)   . Melena 1015/2015   PEPTIC ULCER    REQUIRING TRANSFUSION  . Pedal edema 10/12/2016  . PVD (peripheral vascular disease) ([place])   . Rib fracture 04/21/2015  . Thiamine deficiency 08/13/2015  . UTI (urinary tract infection) 04/08/2014  . Weight loss, non-intentional 01/29/2014    Patient Active Problem List   Diagnosis Date Noted  . Anticoagulated on Coumadin 06/07/2017  . Encounter for therapeutic drug monitoring 05/31/2017  . DVT (deep venous thrombosis) ([place]) 05/22/2017  . Atrial fibrillation with RVR ([place]) 05/22/2017  . Ataxia 05/21/2017  . Shortness of breath 04/28/2017  . Stroke (cerebrum) ([place]) 02/01/2017  . Sinusitis 01/31/2017  . Pedal edema 10/12/2016  . Thiamine deficiency 08/13/2015  . Rib fracture 04/21/2015  . UTI (urinary tract infection) 04/08/2014  . Duodenal ulcer with hemorrhage 12/29/2013  . CKD (chronic kidney disease) stage 4, GFR 15-29 ml/min (HCC) 12/28/2013  . GIB (gastrointestinal bleeding) 12/28/2013  . History of CVA (cerebrovascular accident) 12/28/2013  . Chronic diastolic heart failure ([place]) 12/28/2013  . Overweight (BMI 25.0-29.9) 12/28/2013  . Depression 12/24/2013  . Hypersomnolence 03/12/2013  . Arthritis 12/06/2012  . Unstable angina ([place]) 06/07/2012  . CAD (coronary artery disease) of artery bypass graft   . Hx of CABG 06/05/2012  . Debility 06/05/2012  . Anemia 05/27/2011  . Diabetes mellitus type 2, insulin dependent ([place])  12/22/2010  . Dementia 12/22/2010  . B12 deficiency 12/22/2010  . Type 2 diabetes mellitus with diabetic neuropathy, unspecified ([place]) 08/28/2008  . MURMUR 07/04/2008  . AMAUROSIS FUGAX 08/12/2007  . Hyperlipidemia 06/22/2007  . Essential hypertension 06/22/2007  . Peripheral vascular disease ([place]) 06/21/2007  . CAROTID BRUIT 06/21/2007    Past Surgical History:  Procedure Laterality Date  . ABDOMINAL HYSTERECTOMY    . COLONOSCOPY  05/20/2011   Procedure:  COLONOSCOPY;  Surgeon: Scarlette Shorts, MD;  Location: [facility];  Service: Endoscopy;  Laterality: N/A;  . CORONARY ARTERY BYPASS GRAFT  1999   x 4  . ENTEROSCOPY N/A 12/29/2013   Procedure: ENTEROSCOPY;  Surgeon: Inda Castle, MD;  Location: [facility];  Service: Endoscopy;  Laterality: N/A;  . ESOPHAGOGASTRODUODENOSCOPY  05/16/2011   Procedure: ESOPHAGOGASTRODUODENOSCOPY (EGD);  Surgeon: Gatha Mayer, MD;  Location[facility] ENDOSCOPY;  Service: Endoscopy;  Laterality: N/A;  . ESOPHAGOGASTRODUODENOSCOPY  12/08/2011   Procedure: ESOPHAGOGASTRODUODENOSCOPY (EGD);  Surgeon: Ladene Artist, MD,FACG;  Location[facility] ENDOSCOPY;  Service: Endoscopy;  Laterality: N/A;  . UPPER GASTROINTESTINAL ENDOSCOPY  02/22/2008   w/biopsy, duedenal ulcer, antrum erosions     OB History   None      Home Medications    Prior to Admission medications   Medication Sig Start Date End Date Taking? Authorizing Provider  albuterol (PROAIR HFA) 108 (90 Base) MCG/ACT inhaler Inhale 2 puffs into the lungs every 6 (six) hours as needed for wheezing or shortness of breath. 05/27/17   Saguier, Percell Miller, PA-C  amLODipine (NORVASC) 5 MG tablet Take 1 tablet (5 mg total) by mouth daily. 04/27/17   Mosie Lukes, MD  atorvastatin (LIPITOR) 20 MG tablet Take 1 tablet (20 mg total) by mouth daily. 04/27/17   Mosie Lukes, MD  Cholecalciferol (VITAMIN D) 1000 UNITS capsule Take 1,000 Units by mouth every evening.     [provider]  citalopram (CELEXA) 20 MG tablet Take 1 tablet (20 mg total) by mouth daily. 04/27/17   Mosie Lukes, MD  ferrous sulfate 325 (65 FE) MG tablet Take 325 mg daily with breakfast by mouth.    [provider]  Insulin Glargine (LANTUS SOLOSTAR) 100 UNIT/ML Solostar Pen ADMINISTER 35 UNITS UNDER THE SKIN DAILY AT in the morning. INCREASE BY 2 UNITS EVERY 3 DAYS IF ALL FSG>120 03/22/17   Mosie Lukes, MD  insulin lispro (HUMALOG KWIKPEN) 100 UNIT/ML KiwkPen Inject 0.05-0.1 mLs (5-10  Units total) into the skin 3 (three) times daily with meals. 03/02/16   Mosie Lukes, MD  ipratropium (ATROVENT HFA) 17 MCG/ACT inhaler Inhale 2 puffs into the lungs every 6 (six) hours as needed for wheezing. 05/21/17   Saguier, Percell Miller, PA-C  isosorbide mononitrate (IMDUR) 30 MG 24 hr tablet Take 1 tablet (30 mg total) by mouth at bedtime. Patient taking differently: Take 30 mg by mouth daily with supper.  04/27/17   Mosie Lukes, MD  isosorbide mononitrate (IMDUR) 60 MG 24 hr tablet TAKE 60 mg TABLET BY MOUTH EVERY DAY in the morning 04/27/17   Mosie Lukes, MD  metoprolol tartrate (LOPRESSOR) 25 MG tablet Take 0.5 tablets (12.5 mg total) by mouth 2 (two) times daily. 05/27/17   Aline August, MD  nitroGLYCERIN (NITROSTAT) 0.4 MG SL tablet Place 0.4 mg under the tongue every 5 (five) minutes as needed for chest pain.    [provider]  Omega-3 Fatty Acids (FISH OIL) 1200 MG CAPS Take 1,200 mg by mouth daily.  [provider]  pantoprazole (PROTONIX) 40 MG tablet Take 40 mg by mouth every Monday, Wednesday, and Friday.    [provider]  ranitidine (ZANTAC) 300 MG tablet Take 1/2 tablet at bedtime 02/16/17   Renato Shin, MD  thiamine (VITAMIN B-1) 100 MG tablet Take 1 tablet (100 mg total) by mouth daily. Patient taking differently: Take 100 mg See admin instructions by mouth. Monday, Wednesday and Friday 02/24/16   Mosie Lukes, MD  triamterene-hydrochlorothiazide (MAXZIDE-25) 37.5-25 MG tablet Take 0.5 tablets by mouth daily. 03/22/17   Mosie Lukes, MD  warfarin (COUMADIN) 5 MG tablet Take 1 tablet (5 mg total) by mouth daily. 05/27/17 06/26/17  Aline August, MD    Family History Family History  Problem Relation Age of Onset  . Coronary artery disease Father   . Hypertension Father   . Stroke Father   . Cancer Mother        ?  . Diabetes Maternal Uncle   . Stroke Sister   . Colon cancer Neg Hx     Social History Social History   Tobacco  Use  . Smoking status: Former Smoker    Types: Cigarettes    Last attempt to quit: 12/31/2000    Years since quitting: 16.4  . Smokeless tobacco: Never Used  Substance Use Topics  . Alcohol use: No  . Drug use: No     Allergies   Patient has no known allergies.   Review of Systems Review of Systems  Constitutional: Negative for fever.  Gastrointestinal: Positive for anal bleeding, blood in stool and hematochezia. Negative for abdominal pain, constipation, nausea and vomiting.  Genitourinary: Negative for flank pain.  Neurological: Positive for dizziness and light-headedness.  All other systems reviewed and are negative.    Physical Exam Updated Vital Signs BP (!) 144/62   Pulse (!) 40   Temp 98 F (36.7 C) (Oral)   Resp 17   Ht 5\' 6"  (1.676 m)   Wt 72.1 kg (159 lb)   SpO2 100%   BMI 25.66 kg/m   Physical Exam  Constitutional: She appears well-developed and well-nourished. No distress.  HENT:  Head: Normocephalic and atraumatic.  Mouth/Throat: No oropharyngeal exudate.  Eyes: Pupils are equal, round, and reactive to light.  pale  Neck: Neck supple.  Cardiovascular: Regular rhythm. Bradycardia present.  Pulmonary/Chest: Effort normal and breath sounds normal. No stridor. She has no wheezes. She has no rales.  Abdominal: Soft. Bowel sounds are normal. She exhibits no mass. There is no tenderness. There is no rebound and no guarding.  Musculoskeletal: Normal range of motion.  Neurological: She is alert. She displays normal reflexes.  Skin: Skin is warm and dry. Capillary refill takes less than 2 seconds. There is pallor.  Psychiatric: She has a normal mood and affect.     ED Treatments / Results  Labs (all labs ordered are listed, but only abnormal results are displayed)  Results for orders placed or performed during the hospital encounter of 06/07/17  CBC with Differential/Platelet  Result Value Ref Range   WBC 8.2 4.0 - 10.5 K/uL   RBC 3.74 (L) 3.87 -  5.11 MIL/uL   Hemoglobin 11.6 (L) 12.0 - 15.0 g/dL   HCT 33.9 (L) 36.0 - 46.0 %   MCV 90.6 78.0 - 100.0 fL   MCH 31.0 26.0 - 34.0 pg   MCHC 34.2 30.0 - 36.0 g/dL   RDW 13.0 11.5 - 15.5 %   Platelets 217 150 - 400  K/uL   Neutrophils Relative % 59 %   Neutro Abs 4.7 1.7 - 7.7 K/uL   Lymphocytes Relative 29 %   Lymphs Abs 2.4 0.7 - 4.0 K/uL   Monocytes Relative 9 %   Monocytes Absolute 0.8 0.1 - 1.0 K/uL   Eosinophils Relative 3 %   Eosinophils Absolute 0.3 0.0 - 0.7 K/uL   Basophils Relative 0 %   Basophils Absolute 0.0 0.0 - 0.1 K/uL  Basic metabolic panel  Result Value Ref Range   Sodium 137 135 - 145 mmol/L   Potassium 4.6 3.5 - 5.1 mmol/L   Chloride 110 101 - 111 mmol/L   CO2 19 (L) 22 - 32 mmol/L   Glucose, Bld 133 (H) 65 - 99 mg/dL   BUN 62 (H) 6 - 20 mg/dL   Creatinine, Ser 2.07 (H) 0.44 - 1.00 mg/dL   Calcium 9.0 8.9 - 10.3 mg/dL   GFR calc non Af Amer 21 (L) >60 mL/min   GFR calc Af Amer 24 (L) >60 mL/min   Anion gap 8 5 - 15  Protime-INR  Result Value Ref Range   Prothrombin Time 33.4 (H) 11.4 - 15.2 seconds   INR 3.31   Occult blood card to lab, stool  Result Value Ref Range   Fecal Occult Bld NEGATIVE NEGATIVE   Dg Chest 2 View  Result Date: 05/21/2017 CLINICAL DATA:  Cough and intermittent wheezing. EXAM: CHEST - 2 VIEW COMPARISON:  02/01/2017 FINDINGS: Chronic cardiomegaly. There is aortic tortuosity that is stable. Status post CABG. The lungs are clear. Mild hyperinflation. No acute osseous finding. IMPRESSION: 1. No evidence of active disease. 2. Chronic cardiomegaly. Electronically Signed   By: Monte Fantasia M.D.   On: 05/21/2017 17:57   Ct Head Wo Contrast  Result Date: 05/21/2017 CLINICAL DATA:  84 year old female with increased dizziness. EXAM: CT HEAD WITHOUT CONTRAST TECHNIQUE: Contiguous axial images were obtained from the base of the skull through the vertex without intravenous contrast. COMPARISON:  02/02/2017 CT and prior studies FINDINGS: Brain:  No evidence of acute infarction, hemorrhage, hydrocephalus, extra-axial collection or mass lesion/mass effect. Atrophy, chronic small-vessel white matter ischemic changes again noted. Remote bilateral basal ganglia, periventricular white matter and RIGHT parietal/occipital infarcts again noted. Vascular: Atherosclerotic calcifications again noted. Skull: Normal. Negative for fracture or focal lesion. Sinuses/Orbits: No acute abnormality Other: None IMPRESSION: 1. No evidence of acute intracranial abnormality 2. Atrophy, chronic small-vessel white matter ischemic changes and remote infarcts as described. Electronically Signed   By: Margarette Canada M.D.   On: 05/21/2017 18:01   Mr Brain Wo Contrast  Result Date: 05/22/2017 CLINICAL DATA:  Ataxia.  History of stroke. EXAM: MRI HEAD WITHOUT CONTRAST TECHNIQUE: Multiplanar, multiecho pulse sequences of the brain and surrounding structures were obtained without intravenous contrast. COMPARISON:  Head CT 05/21/2017 and MRI 02/02/2017 FINDINGS: Brain: There is no evidence of acute infarct, intracranial hemorrhage, mass, midline shift, or extra-axial fluid collection. A partially empty sella is again incidentally noted. There is moderate cerebral atrophy. Chronic infarcts are again seen in the left greater than right basal ganglia, deep white matter on the left, and right occipital lobe. There also may be a tiny chronic left cerebellar infarct. T2 hyperintensities elsewhere in the cerebral white matter bilaterally are similar to the prior MRI and nonspecific but compatible with moderate chronic small vessel ischemic disease. Ex vacuo enlargement of the left lateral ventricle and a cavum septum pellucidum et vergae are noted. Vascular: Stable abnormal appearance of the distal right  V4 segment likely reflecting chronic occlusion distal to PICA. Other major intracranial vascular flow voids are preserved. Skull and upper cervical spine: Unremarkable bone marrow signal.  Sinuses/Orbits: Bilateral cataract extraction. No significant sinus disease. Other: None. IMPRESSION: 1. No acute intracranial abnormality. 2. Moderate chronic small vessel ischemic disease and cerebral atrophy with chronic infarcts as above. Electronically Signed   By: Logan Bores M.D.   On: 05/22/2017 10:51   US Venous Img Lower Unilateral Left  Result Date: 05/21/2017 CLINICAL DATA:  Left knee weakness and swelling for 2 days. EXAM: LEFT LOWER EXTREMITY VENOUS DOPPLER ULTRASOUND TECHNIQUE: Gray-scale sonography with graded compression, as well as color Doppler and duplex ultrasound were performed to evaluate the lower extremity deep venous systems from the level of the common femoral vein and including the common femoral, femoral, profunda femoral, popliteal and calf veins including the posterior tibial, peroneal and gastrocnemius veins when visible. The superficial great saphenous vein was also interrogated. Spectral Doppler was utilized to evaluate flow at rest and with distal augmentation maneuvers in the common femoral, femoral and popliteal veins. COMPARISON:  None. FINDINGS: Contralateral Common Femoral Vein: Respiratory phasicity is normal and symmetric with the symptomatic side. No evidence of thrombus. Normal compressibility. Common Femoral Vein: No evidence of thrombus. Normal compressibility, respiratory phasicity and response to augmentation. Saphenofemoral Junction: No evidence of thrombus. Normal compressibility and flow on color Doppler imaging. Profunda Femoral Vein: No evidence of thrombus. Normal compressibility and flow on color Doppler imaging. Femoral Vein: No evidence of thrombus. Normal compressibility, respiratory phasicity and response to augmentation. Popliteal Vein: No evidence of thrombus. Normal compressibility, respiratory phasicity and response to augmentation. Calf Veins: There are multiple noncompressible gastrocnemius veins. Some of these veins have occlusive thrombus and  others have nonocclusive thrombus. Visualized posterior tibial veins are compressible without thrombus. Superficial Great Saphenous Vein: No evidence of thrombus. Normal compressibility. Other Findings: Small fluid collection in the popliteal fossa measures 3.5 x 0.7 x 1.6 cm. Findings are compatible with a small Baker's cyst. IMPRESSION: Positive for DVT in the left calf. Evidence for thrombus in left gastrocnemius veins. Small left knee Baker's cyst. Electronically Signed   By: Markus Daft M.D.   On: 05/21/2017 18:03    EKG EKG Interpretation  Date/Time:  Monday June 07 2017 13:42:08 EDT Ventricular Rate:  41 PR Interval:    QRS Duration: 138 QT Interval:  576 QTC Calculation: 476 R Axis:   -14 Text Interpretation:  Sinus bradycardia Prolonged PR interval Right bundle branch block Confirmed by Randal Buba, Willett Lefeber ([id number]) on 06/07/2017 1:47:27 PM   Radiology No results found.  Procedures Procedures (including critical care time)  Medications Ordered in ED Medications  sodium chloride 0.9 % bolus 1,000 mL (1,000 mLs Intravenous Given 06/07/17 1450)  phytonadione (VITAMIN K) 10 mg in dextrose 5 % 50 mL IVPB ( Intravenous Restarted 06/07/17 1508)  phytonadione (VITAMIN K) 10 MG/ML SQ injection (has no administration in time range)  sodium chloride 0.9 % bolus 500 mL (has no administration in time range)  prothrombin complex conc human (KCENTRA) IVPB 1,662 Units (0 Units Intravenous Stopped 06/07/17 1504)    MDM Reviewed: previous chart, nursing note and vitals Interpretation: labs and ECG Total time providing critical care: 75-105 minutes (FEIBA protocol instituted). This excludes time spent performing separately reportable procedures and services. Consults: admitting MD and gastrointestinal   CRITICAL CARE Performed by: Carlisle Beers Total critical care time: 91 minutes Critical care time was exclusive of separately billable procedures and treating other patients.  Critical  care was necessary to treat or prevent imminent or life-threatening deterioration. Critical care was time spent personally by me on the following activities: development of treatment plan with patient and/or surrogate as well as nursing, discussions with consultants, evaluation of patient's response to treatment, examination of patient, obtaining history from patient or surrogate, ordering and performing treatments and interventions, ordering and review of laboratory studies, ordering and review of radiographic studies, pulse oximetry and re-evaluation of patient's condition.   Final Clinical Impressions(s) / ED Diagnoses   Final diagnoses:  Rectal bleeding   Will admit to medicine.  Will need a filter given bleeding on coumadin .      Quinesha Selinger, MD 06/07/17 [place], Leyla Soliz, MD 06/28/17 [id number]

## 2017-06-07 NOTE — H&P (Signed)
[facility]                                                                                    Patient Demographics  Destiny [name], is a 84 y.o. female  CSN: [id number]  MRN: [id number]  DOB - [birth year]  Admit Date - 06/07/2017  Outpatient Primary MD for the patient is Mosie Lukes, MD   With History of -  Past Medical History:  Diagnosis Date  . Acute renal insufficiency 12/24/2013  . Amaurosis fugax   . Arthritis 12/06/2012  . Atherosclerosis   . CAD (coronary artery disease)    a. s/p CABG x 4 (LIMA->LAD, VG->Diag, VG->OM1->OM3);  b. 2010 Cath: 3/4 patent grafts (VG->diag occluded), 3vd, sev PVD ->Med Rx;  c. 12/2011 Lexi MV: sm area of mild mid and apical ant wall ischemia ->med rx.  . Carotid bruit   . Chronic kidney disease (CKD) stage G4/A1, severely decreased glomerular filtration rate (GFR) between 15-29 mL/min/1.73 square meter and albuminuria creatinine ratio less than 30 mg/g (HCC)    GFR 25 on 12/25/13  . CVA (cerebral infarction)   . Dementia    pt denies  . Depression 12/24/2013  . Diabetes mellitus type II    insulin dependent.   . Diabetic peripheral neuropathy ([place])   . Diabetic retinopathy   . Diverticulosis   . Duodenal ulcer 2009 and 11/2011   caused GI bleeding  . Gallstones 2009   seen on CT scan  . GI bleed 04/2011, 11/2011   found non-bleeding duodenal ulcers  . Hyperlipidemia   . Hypersomnolence 03/12/2013  . Hypertension   . Iron deficiency anemia secondary to blood loss (chronic)   . Melena 1015/2015   PEPTIC ULCER    REQUIRING TRANSFUSION  . Pedal edema 10/12/2016  . PVD (peripheral vascular disease) ([place])   . Rib fracture 04/21/2015  . Thiamine deficiency 08/13/2015  . UTI (urinary tract infection) 04/08/2014  . Weight loss, non-intentional 01/29/2014      Past Surgical History:  Procedure Laterality Date  . ABDOMINAL HYSTERECTOMY    . COLONOSCOPY  05/20/2011   Procedure: COLONOSCOPY;  Surgeon: Scarlette Shorts, MD;   Location: [place];  Service: Endoscopy;  Laterality: N/A;  . CORONARY ARTERY BYPASS GRAFT  1999   x 4  . ENTEROSCOPY N/A 12/29/2013   Procedure: ENTEROSCOPY;  Surgeon: Inda Castle, MD;  Location: [place];  Service: Endoscopy;  Laterality: N/A;  . ESOPHAGOGASTRODUODENOSCOPY  05/16/2011   Procedure: ESOPHAGOGASTRODUODENOSCOPY (EGD);  Surgeon: Gatha Mayer, MD;  Location[facility]COPY;  Service: Endoscopy;  Laterality: N/A;  . ESOPHAGOGASTRODUODENOSCOPY  12/08/2011   Procedure: ESOPHAGOGASTRODUODENOSCOPY (EGD);  Surgeon: Ladene Artist, MD,FACG;  Location: [facility] ENDOSCOPY;  Service: Endoscopy;  Laterality: N/A;  . UPPER GASTROINTESTINAL ENDOSCOPY  02/22/2008   w/biopsy, duedenal ulcer, antrum erosions    in for   Chief Complaint  Patient presents with  . Rectal Bleeding     HPI  Destiny [name]  is a 84 y.o. female, with PMHx sig.for CKD , CVA, dementia , history of duodenal ulcer, antrum erosions and CAD S/P CABG 2010 who was recently diagnosed with DVT around 2 weeks  ago, started on Coumadin .  Patient is presenting today with bright red blood per rectum and melena for the last 2 days.  No nausea vomiting or abdominal pain.  She reports that she just got out of bed and it was explosive. Patient received K Centra in the emergency room after which her INR dropped to 1.5 and transferred transferred to our facility for GI consult/procedure.  Patient's hemoglobin is 11.3.  No more reports of bleeding.    Review of Systems    In addition to the HPI above, No Fever, No Headache, No changes with Vision or hearing, No problems swallowing food or Liquids, No Chest pain, Cough or Shortness of Breath, No Abdominal pain, No Nausea or Vommitting, No dysuria, No new skin rashes or bruises, No new joints pains-aches,  No new weakness, tingling, numbness in any extremity, No recent weight gain or loss, No polyuria, polydypsia or polyphagia,   A full 10 point Review of Systems was  done, except as stated above, all other Review of Systems were negative.   Social History Social History   Tobacco Use  . Smoking status: Former Smoker    Types: Cigarettes    Last attempt to quit: 12/31/2000    Years since quitting: 16.4  . Smokeless tobacco: Never Used  Substance Use Topics  . Alcohol use: No     Family History Family History  Problem Relation Age of Onset  . Coronary artery disease Father   . Hypertension Father   . Stroke Father   . Cancer Mother        ?  . Diabetes Maternal Uncle   . Stroke Sister   . Colon cancer Neg Hx      Prior to Admission medications   Medication Sig Start Date End Date Taking? Authorizing Provider  amLODipine (NORVASC) 5 MG tablet Take 1 tablet (5 mg total) by mouth daily. 04/27/17  Yes Mosie Lukes, MD  atorvastatin (LIPITOR) 20 MG tablet Take 1 tablet (20 mg total) by mouth daily. 04/27/17  Yes Mosie Lukes, MD  Cholecalciferol (VITAMIN D) 1000 UNITS capsule Take 1,000 Units by mouth daily.    Yes [provider]  citalopram (CELEXA) 20 MG tablet Take 1 tablet (20 mg total) by mouth daily. 04/27/17  Yes Mosie Lukes, MD  ferrous sulfate 325 (65 FE) MG tablet Take 325 mg daily with breakfast by mouth.   Yes [provider]  Insulin Glargine (LANTUS SOLOSTAR) 100 UNIT/ML Solostar Pen ADMINISTER 35 UNITS UNDER THE SKIN DAILY AT in the morning. INCREASE BY 2 UNITS EVERY 3 DAYS IF ALL FSG>120 03/22/17  Yes Mosie Lukes, MD  insulin lispro (HUMALOG KWIKPEN) 100 UNIT/ML KiwkPen Inject 0.05-0.1 mLs (5-10 Units total) into the skin 3 (three) times daily with meals. 03/02/16  Yes Mosie Lukes, MD  isosorbide mononitrate (IMDUR) 30 MG 24 hr tablet Take 1 tablet (30 mg total) by mouth at bedtime. Patient taking differently: Take 30 mg by mouth daily with supper.  04/27/17  Yes Mosie Lukes, MD  isosorbide mononitrate (IMDUR) 60 MG 24 hr tablet TAKE 60 mg TABLET BY MOUTH EVERY DAY in the morning 04/27/17  Yes  Mosie Lukes, MD  metoprolol succinate (TOPROL-XL) 12.5 mg TB24 24 hr tablet Take 6.25 mg by mouth daily.   Yes [provider]  metoprolol tartrate (LOPRESSOR) 25 MG tablet Take 0.5 tablets (12.5 mg total) by mouth 2 (two) times daily. 05/27/17  Yes Aline August, MD  Omega-3 Fatty Acids (FISH OIL) 1200 MG CAPS Take 1,200 mg by mouth daily.    Yes [provider]  pantoprazole (PROTONIX) 40 MG tablet Take 40 mg by mouth daily.    Yes [provider]  ranitidine (ZANTAC) 300 MG tablet Take 1/2 tablet at bedtime 02/16/17  Yes Renato Shin, MD  thiamine (VITAMIN B-1) 100 MG tablet Take 1 tablet (100 mg total) by mouth daily. Patient taking differently: Take 100 mg See admin instructions by mouth. Monday, Wednesday and Friday 02/24/16  Yes Mosie Lukes, MD  triamterene-hydrochlorothiazide (MAXZIDE-25) 37.5-25 MG tablet Take 0.5 tablets by mouth daily. 03/22/17  Yes Mosie Lukes, MD  warfarin (COUMADIN) 5 MG tablet Take 1 tablet (5 mg total) by mouth daily. Patient taking differently: Take 2.5-5 mg by mouth as directed. Take 0.5 tablet (2.5 mg) on (Mon, Wed, Fri, Sat) and Take 1 tablet (5 mg) on (Tues, Thurs, 05/27/17 06/26/17 Yes Aline August, MD  albuterol (PROAIR HFA) 108 (90 Base) MCG/ACT inhaler Inhale 2 puffs into the lungs every 6 (six) hours as needed for wheezing or shortness of breath. 05/27/17   Saguier, Percell Miller, PA-C  ipratropium (ATROVENT HFA) 17 MCG/ACT inhaler Inhale 2 puffs into the lungs every 6 (six) hours as needed for wheezing. 05/21/17   Saguier, Percell Miller, PA-C  nitroGLYCERIN (NITROSTAT) 0.4 MG SL tablet Place 0.4 mg under the tongue every 5 (five) minutes as needed for chest pain.    [provider]    No Known Allergies  Physical Exam  Vitals  Blood pressure (!) 157/58, pulse (!) 42, temperature 98 F (36.7 C), temperature source Oral, resp. rate 16, height 5\' 6"  (1.676 m), weight 72.1 kg (159 lb), SpO2 96 %.   1. General elderly  female, extremely pleasant in no acute distress at this time  2. Normal affect and insight, Not Suicidal or Homicidal, Awake Alert, Oriented X 3.  3. No F.N deficits, grossly, patient moving all extremities  4. Ears and Eyes appear Normal, Conjunctivae clear, PERRLA. Moist Oral Mucosa.  5. Supple Neck, No JVD, No cervical lymphadenopathy appriciated, No Carotid Bruits.  6. Symmetrical Chest wall movement, Good air movement bilaterally, CTAB.  7. RRR, No Gallops, Rubs or Murmurs, No Parasternal Heave.  8. Positive Bowel Sounds, Abdomen Soft, Non tender, No organomegaly appriciated,No rebound -guarding or rigidity.  9.  No Cyanosis, Normal Skin Turgor, No Skin Rash or Bruise.  10. Good muscle tone,  joints appear normal , no effusions, Normal ROM.    Data Review  CBC Recent Labs  Lab 06/07/17 1425  WBC 8.2  HGB 11.6*  HCT 33.9*  PLT 217  MCV 90.6  MCH 31.0  MCHC 34.2  RDW 13.0  LYMPHSABS 2.4  MONOABS 0.8  EOSABS 0.3  BASOSABS 0.0   ------------------------------------------------------------------------------------------------------------------  Chemistries  Recent Labs  Lab 06/07/17 1425  NA 137  K 4.6  CL 110  CO2 19*  GLUCOSE 133*  BUN 62*  CREATININE 2.07*  CALCIUM 9.0   ------------------------------------------------------------------------------------------------------------------ estimated creatinine clearance is 20.6 mL/min (A) (by C-G formula based on SCr of 2.07 mg/dL (H)). ------------------------------------------------------------------------------------------------------------------ No results for input(s): TSH, T4TOTAL, T3FREE, THYROIDAB in the last 72 hours.  Invalid input(s): FREET3   Coagulation profile Recent Labs  Lab 06/07/17 06/07/17 1425 06/07/17 1614  INR 3.6 3.31 1.56   ------------------------------------------------------------------------------------------------------------------- No results for input(s): DDIMER in the  last 72 hours. -------------------------------------------------------------------------------------------------------------------  Cardiac Enzymes No results for input(s): CKMB, TROPONINI, MYOGLOBIN in the last 168 hours.  Invalid  input(s): CK ------------------------------------------------------------------------------------------------------------------ Invalid input(s): POCBNP   ---------------------------------------------------------------------------------------------------------------  Urinalysis    Component Value Date/Time   COLORURINE YELLOW 02/02/2017 1939   APPEARANCEUR CLOUDY (A) 02/02/2017 1939   LABSPEC 1.013 02/02/2017 1939   PHURINE 5.0 02/02/2017 1939   GLUCOSEU NEGATIVE 02/02/2017 1939   GLUCOSEU 100 (A) 01/26/2017 1114   HGBUR SMALL (A) 02/02/2017 1939   HGBUR large 04/26/2009 1110   BILIRUBINUR Negative 05/21/2017 1512   KETONESUR NEGATIVE 02/02/2017 1939   PROTEINUR 1+ 05/21/2017 1512   PROTEINUR 100 (A) 02/02/2017 1939   UROBILINOGEN negative (A) 05/21/2017 1512   UROBILINOGEN 0.2 01/26/2017 1114   NITRITE Negative 05/21/2017 1512   NITRITE NEGATIVE 02/02/2017 1939   LEUKOCYTESUR Negative 05/21/2017 1512    ----------------------------------------------------------------------------------------------------------------   Imaging results:   Dg Chest 2 View  Result Date: 05/21/2017 CLINICAL DATA:  Cough and intermittent wheezing. EXAM: CHEST - 2 VIEW COMPARISON:  02/01/2017 FINDINGS: Chronic cardiomegaly. There is aortic tortuosity that is stable. Status post CABG. The lungs are clear. Mild hyperinflation. No acute osseous finding. IMPRESSION: 1. No evidence of active disease. 2. Chronic cardiomegaly. Electronically Signed   By: Monte Fantasia M.D.   On: 05/21/2017 17:57   Ct Head Wo Contrast  Result Date: 05/21/2017 CLINICAL DATA:  84 year old female with increased dizziness. EXAM: CT HEAD WITHOUT CONTRAST TECHNIQUE: Contiguous axial images were  obtained from the base of the skull through the vertex without intravenous contrast. COMPARISON:  02/02/2017 CT and prior studies FINDINGS: Brain: No evidence of acute infarction, hemorrhage, hydrocephalus, extra-axial collection or mass lesion/mass effect. Atrophy, chronic small-vessel white matter ischemic changes again noted. Remote bilateral basal ganglia, periventricular white matter and RIGHT parietal/occipital infarcts again noted. Vascular: Atherosclerotic calcifications again noted. Skull: Normal. Negative for fracture or focal lesion. Sinuses/Orbits: No acute abnormality Other: None IMPRESSION: 1. No evidence of acute intracranial abnormality 2. Atrophy, chronic small-vessel white matter ischemic changes and remote infarcts as described. Electronically Signed   By: Margarette Canada M.D.   On: 05/21/2017 18:01   Mr Brain Wo Contrast  Result Date: 05/22/2017 CLINICAL DATA:  Ataxia.  History of stroke. EXAM: MRI HEAD WITHOUT CONTRAST TECHNIQUE: Multiplanar, multiecho pulse sequences of the brain and surrounding structures were obtained without intravenous contrast. COMPARISON:  Head CT 05/21/2017 and MRI 02/02/2017 FINDINGS: Brain: There is no evidence of acute infarct, intracranial hemorrhage, mass, midline shift, or extra-axial fluid collection. A partially empty sella is again incidentally noted. There is moderate cerebral atrophy. Chronic infarcts are again seen in the left greater than right basal ganglia, deep white matter on the left, and right occipital lobe. There also may be a tiny chronic left cerebellar infarct. T2 hyperintensities elsewhere in the cerebral white matter bilaterally are similar to the prior MRI and nonspecific but compatible with moderate chronic small vessel ischemic disease. Ex vacuo enlargement of the left lateral ventricle and a cavum septum pellucidum et vergae are noted. Vascular: Stable abnormal appearance of the distal right V4 segment likely reflecting chronic occlusion  distal to PICA. Other major intracranial vascular flow voids are preserved. Skull and upper cervical spine: Unremarkable bone marrow signal. Sinuses/Orbits: Bilateral cataract extraction. No significant sinus disease. Other: None. IMPRESSION: 1. No acute intracranial abnormality. 2. Moderate chronic small vessel ischemic disease and cerebral atrophy with chronic infarcts as above. Electronically Signed   By: Logan Bores M.D.   On: 05/22/2017 10:51   US Venous Img Lower Unilateral Left  Result Date: 05/21/2017 CLINICAL DATA:  Left knee weakness and swelling for 2  days. EXAM: LEFT LOWER EXTREMITY VENOUS DOPPLER ULTRASOUND TECHNIQUE: Gray-scale sonography with graded compression, as well as color Doppler and duplex ultrasound were performed to evaluate the lower extremity deep venous systems from the level of the common femoral vein and including the common femoral, femoral, profunda femoral, popliteal and calf veins including the posterior tibial, peroneal and gastrocnemius veins when visible. The superficial great saphenous vein was also interrogated. Spectral Doppler was utilized to evaluate flow at rest and with distal augmentation maneuvers in the common femoral, femoral and popliteal veins. COMPARISON:  None. FINDINGS: Contralateral Common Femoral Vein: Respiratory phasicity is normal and symmetric with the symptomatic side. No evidence of thrombus. Normal compressibility. Common Femoral Vein: No evidence of thrombus. Normal compressibility, respiratory phasicity and response to augmentation. Saphenofemoral Junction: No evidence of thrombus. Normal compressibility and flow on color Doppler imaging. Profunda Femoral Vein: No evidence of thrombus. Normal compressibility and flow on color Doppler imaging. Femoral Vein: No evidence of thrombus. Normal compressibility, respiratory phasicity and response to augmentation. Popliteal Vein: No evidence of thrombus. Normal compressibility, respiratory phasicity and  response to augmentation. Calf Veins: There are multiple noncompressible gastrocnemius veins. Some of these veins have occlusive thrombus and others have nonocclusive thrombus. Visualized posterior tibial veins are compressible without thrombus. Superficial Great Saphenous Vein: No evidence of thrombus. Normal compressibility. Other Findings: Small fluid collection in the popliteal fossa measures 3.5 x 0.7 x 1.6 cm. Findings are compatible with a small Baker's cyst. IMPRESSION: Positive for DVT in the left calf. Evidence for thrombus in left gastrocnemius veins. Small left knee Baker's cyst. Electronically Signed   By: Markus Daft M.D.   On: 05/21/2017 18:03      Assessment & Plan  1.  GI bleed bright red blood per rectum; lower .  Patient on Coumadin 2.  Right leg DVT on Coumadin, diagnosed 2 weeks ago 3.  Coagulopathy, INR was 3.6 and dropped to 1.5, reversed by Kcentra 4.  History of duodenal ulcer , antrum erosions status post EGD in 2009  Plan  Admit to telemetry Follow hemoglobin/hematocrit Dr. Ardis Hughs from GI will see patient in a.m. N.p.o. after midnight IV Protonix May need to consult to IR for IVC filter placement No need for emergency interventions at this time due to the fact that patient is clinically stable.    DVT SCDs  AM Labs Ordered, also please review Full Orders    Code Status full  Disposition Plan: Home  Time spent in minutes : 42 minutes  Condition GUARDED   @SIGNATURE @

## 2017-06-08 ENCOUNTER — Encounter (HOSPITAL_COMMUNITY)
Admission: EM | Disposition: A | Discharge status: Home Health | Payer: Self-pay | Source: Home / Self Care | Attending: Internal Medicine

## 2017-06-08 ENCOUNTER — Encounter (HOSPITAL_COMMUNITY): Payer: Self-pay

## 2017-06-08 ENCOUNTER — Inpatient Hospital Stay (HOSPITAL_COMMUNITY): Payer: Medicare Other | Admitting: Anesthesiology

## 2017-06-08 DIAGNOSIS — K2990 Gastroduodenitis, unspecified, without bleeding: Secondary | ICD-10-CM

## 2017-06-08 DIAGNOSIS — K2970 Gastritis, unspecified, without bleeding: Secondary | ICD-10-CM

## 2017-06-08 DIAGNOSIS — K254 Chronic or unspecified gastric ulcer with hemorrhage: Secondary | ICD-10-CM

## 2017-06-08 DIAGNOSIS — R791 Abnormal coagulation profile: Secondary | ICD-10-CM

## 2017-06-08 DIAGNOSIS — K921 Melena: Principal | ICD-10-CM

## 2017-06-08 HISTORY — PX: ESOPHAGOGASTRODUODENOSCOPY (EGD) WITH PROPOFOL: SHX5813

## 2017-06-08 LAB — CBC
HCT: 31.1 % — ABNORMAL LOW (ref 36.0–46.0)
HCT: 33 % — ABNORMAL LOW (ref 36.0–46.0)
HEMATOCRIT: 39.1 % (ref 36.0–46.0)
HEMOGLOBIN: 10.8 g/dL — AB (ref 12.0–15.0)
HEMOGLOBIN: 13 g/dL (ref 12.0–15.0)
Hemoglobin: 11.1 g/dL — ABNORMAL LOW (ref 12.0–15.0)
MCH: 31.6 pg (ref 26.0–34.0)
MCH: 30.7 pg (ref 26.0–34.0)
MCH: 31 pg (ref 26.0–34.0)
MCHC: 34.7 g/dL (ref 30.0–36.0)
MCHC: 33.6 g/dL (ref 30.0–36.0)
MCHC: 33.2 g/dL (ref 30.0–36.0)
MCV: 90.9 fL (ref 78.0–100.0)
MCV: 91.2 fL (ref 78.0–100.0)
MCV: 93.3 fL (ref 78.0–100.0)
PLATELETS: 178 10*3/uL (ref 150–400)
PLATELETS: 171 10*3/uL (ref 150–400)
Platelets: 207 10*3/uL (ref 150–400)
RBC: 3.42 MIL/uL — ABNORMAL LOW (ref 3.87–5.11)
RBC: 3.62 MIL/uL — ABNORMAL LOW (ref 3.87–5.11)
RBC: 4.19 MIL/uL (ref 3.87–5.11)
RDW: 13.2 % (ref 11.5–15.5)
RDW: 13.1 % (ref 11.5–15.5)
RDW: 13.3 % (ref 11.5–15.5)
WBC: 6.9 10*3/uL (ref 4.0–10.5)
WBC: 6.8 10*3/uL (ref 4.0–10.5)
WBC: 8.6 10*3/uL (ref 4.0–10.5)

## 2017-06-08 LAB — GLUCOSE, CAPILLARY
GLUCOSE-CAPILLARY: 76 mg/dL (ref 65–99)
GLUCOSE-CAPILLARY: 85 mg/dL (ref 65–99)
GLUCOSE-CAPILLARY: 131 mg/dL — AB (ref 65–99)
Glucose-Capillary: 186 mg/dL — ABNORMAL HIGH (ref 65–99)
Glucose-Capillary: 90 mg/dL (ref 65–99)

## 2017-06-08 LAB — PROTIME-INR
INR: 1.33
INR: 1.24
PROTHROMBIN TIME: 16.4 s — AB (ref 11.4–15.2)
PROTHROMBIN TIME: 15.5 s — AB (ref 11.4–15.2)

## 2017-06-08 LAB — HEMOGLOBIN: Hemoglobin: 10.3 g/dL — ABNORMAL LOW (ref 12.0–15.0)

## 2017-06-08 SURGERY — ESOPHAGOGASTRODUODENOSCOPY (EGD) WITH PROPOFOL
Anesthesia: Monitor Anesthesia Care

## 2017-06-08 MED ORDER — DEXTROSE-NACL 5-0.45 % IV SOLN
INTRAVENOUS | Status: DC
  Administered 2017-06-08 – 2017-06-09 (×2): via INTRAVENOUS

## 2017-06-08 MED ORDER — LACTATED RINGERS IV SOLN
INTRAVENOUS | Status: DC | PRN
  Administered 2017-06-08: 14:00:00 via INTRAVENOUS

## 2017-06-08 MED ORDER — PEG-KCL-NACL-NASULF-NA ASC-C 100 G PO SOLR: 1.0000 | Freq: Two times a day (BID) | ORAL | Status: DC

## 2017-06-08 MED ORDER — PROPOFOL 10 MG/ML IV BOLUS
INTRAVENOUS | Status: AC
Start: 2017-06-08 — End: 2017-06-08
  Filled 2017-06-08: qty 20

## 2017-06-08 MED ORDER — GLYCOPYRROLATE 0.2 MG/ML IV SOSY
PREFILLED_SYRINGE | INTRAVENOUS | Status: DC | PRN
  Administered 2017-06-08: 0.4 mg via INTRAVENOUS

## 2017-06-08 MED ORDER — SODIUM CHLORIDE 0.9 % IV SOLN: INTRAVENOUS | Status: DC

## 2017-06-08 MED ORDER — PEG-KCL-NACL-NASULF-NA ASC-C 100 G PO SOLR
0.5000 | Freq: Once | ORAL | Status: AC
  Administered 2017-06-09: 100 g via ORAL
  Filled 2017-06-08: qty 1

## 2017-06-08 MED ORDER — PROPOFOL 10 MG/ML IV BOLUS
INTRAVENOUS | Status: DC | PRN
  Administered 2017-06-08: 20 mg via INTRAVENOUS
  Administered 2017-06-08: 30 mg via INTRAVENOUS

## 2017-06-08 MED ORDER — PEG-KCL-NACL-NASULF-NA ASC-C 100 G PO SOLR
0.5000 | Freq: Once | ORAL | Status: AC
  Administered 2017-06-08: 100 g via ORAL
  Filled 2017-06-08: qty 1

## 2017-06-08 MED ORDER — INSULIN ASPART 100 UNIT/ML ~~LOC~~ SOLN
0.0000 [IU] | SUBCUTANEOUS | Status: DC
  Administered 2017-06-08: 1 [IU] via SUBCUTANEOUS
  Administered 2017-06-08: 2 [IU] via SUBCUTANEOUS
  Administered 2017-06-09: 1 [IU] via SUBCUTANEOUS
  Administered 2017-06-09: 5 [IU] via SUBCUTANEOUS
  Administered 2017-06-09 – 2017-06-10 (×2): 2 [IU] via SUBCUTANEOUS
  Administered 2017-06-10 (×2): 1 [IU] via SUBCUTANEOUS
  Administered 2017-06-10: 9 [IU] via SUBCUTANEOUS
  Administered 2017-06-10: 1 [IU] via SUBCUTANEOUS

## 2017-06-08 MED ORDER — SODIUM CHLORIDE 0.9 % IV SOLN
INTRAVENOUS | Status: DC
  Administered 2017-06-09: 10:00:00 via INTRAVENOUS

## 2017-06-08 SURGICAL SUPPLY — 14 items

## 2017-06-08 NOTE — Care Management Note (Signed)
Case Management Note  Patient Details  Name: Destiny [name] MRN: [id number] Date of Birth: 10/11/1931  Subjective/Objective:Active w/[facility] rep Santiago Glad aware. Readmit.PT cons-await recc.                    Action/Plan:d/c home w/HHC.   Expected Discharge Date:                  Expected Discharge Plan:  [name]  In-House Referral:     Discharge planning Services  CM Consult  Post Acute Care Choice:  Home Health(Active w/[facility] HHPT) Choice offered to:     DME Arranged:    DME Agency:     HH Arranged:    HH Agency:     Status of Service:  In process, will continue to follow  If discussed at Long Length of Stay Meetings, dates discussed:    Additional Comments:  [name], RN 06/08/2017, 3:35 PM

## 2017-06-08 NOTE — Anesthesia Preprocedure Evaluation (Signed)
Anesthesia Evaluation  Patient identified by MRN, date of birth, ID band Patient awake    Reviewed: Allergy & Precautions, NPO status , Patient's Chart, lab work & pertinent test results  Airway Mallampati: II  TM Distance: >3 FB Neck ROM: Full    Dental  (+) Edentulous Upper, Edentulous Lower   Pulmonary former smoker,    breath sounds clear to auscultation       Cardiovascular hypertension,  Rhythm:Regular Rate:Normal     Neuro/Psych    GI/Hepatic   Endo/Other  diabetes  Renal/GU      Musculoskeletal   Abdominal   Peds  Hematology   Anesthesia Other Findings   Reproductive/Obstetrics                             Anesthesia Physical Anesthesia Plan  ASA: III  Anesthesia Plan: MAC   Post-op Pain Management:    Induction:   PONV Risk Score and Plan: Ondansetron  Airway Management Planned: Nasal Cannula and Natural Airway  Additional Equipment:   Intra-op Plan:   Post-operative Plan:   Informed Consent: I have reviewed the patients History and Physical, chart, labs and discussed the procedure including the risks, benefits and alternatives for the proposed anesthesia with the patient or authorized representative who has indicated his/her understanding and acceptance.     Plan Discussed with: CRNA and Anesthesiologist  Anesthesia Plan Comments:         Anesthesia Quick Evaluation

## 2017-06-08 NOTE — Consult Note (Signed)
Consultation  Referring Provider:  Dr. Allyson Sabal    Primary Care Physician:  Mosie Lukes, MD Primary Gastroenterologist: Dr. Carlean Purl      Reason for Consultation: Fabienne Bruns Bleed             HPI:   Destiny [name] is an 84 y.o. female with past medical history significant for CKD, CVA, dementia, history of duodenal ulcer and antral erosions and CAS s/op CABG 2010, recently diagnosed with DVT 2 weeks ago and started on Coumadin, who presented to the ED with melena.    Today, patient found to be poor historian due to dementia, but surrounded by family members who provide her history.  Lives at home and has daily assistance from healthcare providers, at 9 AM yesterday was found with dark tarry stool per caretaker, patient recalls possibly passing stool around 7 AM that morning.  When getting up from bed was "wobbly and unstable", proceeded to her primary care provider appointment and was found to have an elevated PT/INR and sent to the [facility] ER.  Was later transferred here.  She has not had another bowel movement since arrival.    Per family this has occurred multiple times before and is always dark tarry stool.  She is on Pantoprazole 40 mg twice daily over the past 2 weeks since being started on Coumadin for recent DVT.  She also takes Zantac 150 milligrams at night.    Denies fever, chills, weight loss, anorexia, nausea, vomiting, abdominal pain or syncope.  ER Course:  given Eppie Gibson and INR dropped to 1.5, hgb 11.3  GI History: 12/29/13-Dr. Deatra Ina, small bowel endoscopy: Single ulcer measuring 8 x 5 mm in size in the duodenal bulb, otherwise normal exam to the jejunum, normal-appearing esophagus and GE junction, stomach well visualized and normal 12/08/11-Dr. Fuller Plan, EGD: Nonbleeding duodenal ulcer 06/25/11-capsule endoscopy: Normal small bowel and stomach 05/20/11-colonoscopy, Dr. Carlean Purl: Severe diverticulosis throughout the colon, no blood or bleeding, otherwise normal colonoscopy, internal  hemorrhoids 05/16/11-EGD, Dr. Carlean Purl: Normal  Past Medical History:  Diagnosis Date  . Acute renal insufficiency 12/24/2013  . Amaurosis fugax   . Arthritis 12/06/2012  . Atherosclerosis   . CAD (coronary artery disease)    a. s/p CABG x 4 (LIMA->LAD, VG->Diag, VG->OM1->OM3);  b. 2010 Cath: 3/4 patent grafts (VG->diag occluded), 3vd, sev PVD ->Med Rx;  c. 12/2011 Lexi MV: sm area of mild mid and apical ant wall ischemia ->med rx.  . Carotid bruit   . Chronic kidney disease (CKD) stage G4/A1, severely decreased glomerular filtration rate (GFR) between 15-29 mL/min/1.73 square meter and albuminuria creatinine ratio less than 30 mg/g (HCC)    GFR 25 on 12/25/13  . CVA (cerebral infarction)   . Dementia    pt denies  . Depression 12/24/2013  . Diabetes mellitus type II    insulin dependent.   . Diabetic peripheral neuropathy ([place])   . Diabetic retinopathy   . Diverticulosis   . Duodenal ulcer 2009 and 11/2011   caused GI bleeding  . Gallstones 2009   seen on CT scan  . GI bleed 04/2011, 11/2011   found non-bleeding duodenal ulcers  . Hyperlipidemia   . Hypersomnolence 03/12/2013  . Hypertension   . Iron deficiency anemia secondary to blood loss (chronic)   . Melena 1015/2015   PEPTIC ULCER    REQUIRING TRANSFUSION  . Pedal edema 10/12/2016  . PVD (peripheral vascular disease) ([place])   . Rib fracture 04/21/2015  . Thiamine deficiency 08/13/2015  .  UTI (urinary tract infection) 04/08/2014  . Weight loss, non-intentional 01/29/2014    Past Surgical History:  Procedure Laterality Date  . ABDOMINAL HYSTERECTOMY    . COLONOSCOPY  05/20/2011   Procedure: COLONOSCOPY;  Surgeon: Scarlette Shorts, MD;  Location: [place];  Service: Endoscopy;  Laterality: N/A;  . CORONARY ARTERY BYPASS GRAFT  1999   x 4  . ENTEROSCOPY N/A 12/29/2013   Procedure: ENTEROSCOPY;  Surgeon: Inda Castle, MD;  Location: [place];  Service: Endoscopy;  Laterality: N/A;  . ESOPHAGOGASTRODUODENOSCOPY  05/16/2011    Procedure: ESOPHAGOGASTRODUODENOSCOPY (EGD);  Surgeon: Gatha Mayer, MD;  Location[facility]COPY;  Service: Endoscopy;  Laterality: N/A;  . ESOPHAGOGASTRODUODENOSCOPY  12/08/2011   Procedure: ESOPHAGOGASTRODUODENOSCOPY (EGD);  Surgeon: Ladene Artist, MD,FACG;  Location: [facility] ENDOSCOPY;  Service: Endoscopy;  Laterality: N/A;  . UPPER GASTROINTESTINAL ENDOSCOPY  02/22/2008   w/biopsy, duedenal ulcer, antrum erosions    Family History  Problem Relation Age of Onset  . Coronary artery disease Father   . Hypertension Father   . Stroke Father   . Cancer Mother        ?  . Diabetes Maternal Uncle   . Stroke Sister   . Colon cancer Neg Hx      Social History   Tobacco Use  . Smoking status: Former Smoker    Types: Cigarettes    Last attempt to quit: 12/31/2000    Years since quitting: 16.4  . Smokeless tobacco: Never Used  Substance Use Topics  . Alcohol use: No  . Drug use: No    Prior to Admission medications   Medication Sig Start Date End Date Taking? Authorizing Provider  amLODipine (NORVASC) 5 MG tablet Take 1 tablet (5 mg total) by mouth daily. 04/27/17  Yes Mosie Lukes, MD  atorvastatin (LIPITOR) 20 MG tablet Take 1 tablet (20 mg total) by mouth daily. 04/27/17  Yes Mosie Lukes, MD  Cholecalciferol (VITAMIN D) 1000 UNITS capsule Take 1,000 Units by mouth daily.    Yes [provider]  citalopram (CELEXA) 20 MG tablet Take 1 tablet (20 mg total) by mouth daily. 04/27/17  Yes Mosie Lukes, MD  ferrous sulfate 325 (65 FE) MG tablet Take 325 mg daily with breakfast by mouth.   Yes [provider]  Insulin Glargine (LANTUS SOLOSTAR) 100 UNIT/ML Solostar Pen ADMINISTER 35 UNITS UNDER THE SKIN DAILY AT in the morning. INCREASE BY 2 UNITS EVERY 3 DAYS IF ALL FSG>120 03/22/17  Yes Mosie Lukes, MD  insulin lispro (HUMALOG KWIKPEN) 100 UNIT/ML KiwkPen Inject 0.05-0.1 mLs (5-10 Units total) into the skin 3 (three) times daily with meals. 03/02/16  Yes Mosie Lukes, MD  isosorbide mononitrate (IMDUR) 30 MG 24 hr tablet Take 1 tablet (30 mg total) by mouth at bedtime. Patient taking differently: Take 30 mg by mouth daily with supper.  04/27/17  Yes Mosie Lukes, MD  isosorbide mononitrate (IMDUR) 60 MG 24 hr tablet TAKE 60 mg TABLET BY MOUTH EVERY DAY in the morning 04/27/17  Yes Mosie Lukes, MD  metoprolol tartrate (LOPRESSOR) 25 MG tablet Take 0.5 tablets (12.5 mg total) by mouth 2 (two) times daily. 05/27/17  Yes Aline August, MD  Omega-3 Fatty Acids (FISH OIL) 1200 MG CAPS Take 1,200 mg by mouth daily.    Yes [provider]  pantoprazole (PROTONIX) 40 MG tablet Take 40 mg by mouth daily.    Yes [provider]  ranitidine (ZANTAC) 300 MG tablet  Take 1/2 tablet at bedtime 02/16/17  Yes Renato Shin, MD  thiamine (VITAMIN B-1) 100 MG tablet Take 1 tablet (100 mg total) by mouth daily. Patient taking differently: Take 100 mg See admin instructions by mouth. Monday, Wednesday and Friday 02/24/16  Yes Mosie Lukes, MD  triamterene-hydrochlorothiazide (MAXZIDE-25) 37.5-25 MG tablet Take 0.5 tablets by mouth daily. 03/22/17  Yes Mosie Lukes, MD  warfarin (COUMADIN) 5 MG tablet Take 1 tablet (5 mg total) by mouth daily. Patient taking differently: Take 2.5-5 mg by mouth as directed. Take 0.5 tablet (2.5 mg) on (Mon, Wed, Fri, Sat) and Take 1 tablet (5 mg) on (Sun, Tues, Thurs) 05/27/17 06/26/17 Yes Aline August, MD  albuterol (PROAIR HFA) 108 (90 Base) MCG/ACT inhaler Inhale 2 puffs into the lungs every 6 (six) hours as needed for wheezing or shortness of breath. 05/27/17   Saguier, Percell Miller, PA-C  ipratropium (ATROVENT HFA) 17 MCG/ACT inhaler Inhale 2 puffs into the lungs every 6 (six) hours as needed for wheezing. 05/21/17   Saguier, Percell Miller, PA-C  nitroGLYCERIN (NITROSTAT) 0.4 MG SL tablet Place 0.4 mg under the tongue every 5 (five) minutes as needed for chest pain.    [provider]    Current Facility-Administered  Medications  Medication Dose Route Frequency Provider Last Rate Last Dose  . 0.9 %  sodium chloride infusion   Intravenous Continuous Merton Border, MD 50 mL/hr at 06/07/17 2042    . 0.9 %  sodium chloride infusion   Intravenous Continuous Merton Border, MD 75 mL/hr at 06/07/17 2049    . atorvastatin (LIPITOR) tablet 20 mg  20 mg Oral Daily Merton Border, MD      . citalopram (CELEXA) tablet 20 mg  20 mg Oral Daily Merton Border, MD      . ipratropium-albuterol (DUONEB) 0.5-2.5 (3) MG/3ML nebulizer solution 3 mL  3 mL Nebulization Q6H PRN Merton Border, MD      . isosorbide mononitrate (IMDUR) 24 hr tablet 30 mg  30 mg Oral QHS Merton Border, MD   30 mg at 06/07/17 2212  . metoprolol tartrate (LOPRESSOR) tablet 12.5 mg  12.5 mg Oral BID Kirby-Graham, Karsten Fells, NP      . nitroGLYCERIN (NITROSTAT) SL tablet 0.4 mg  0.4 mg Sublingual Q5 min PRN Merton Border, MD      . pantoprazole (PROTONIX) injection 40 mg  40 mg Intravenous Q12H Merton Border, MD   40 mg at 06/07/17 2212  . phytonadione (VITAMIN K) 10 mg in dextrose 5 % 50 mL IVPB  10 mg Intravenous Once Merton Border, MD   Stopped at 06/07/17 1543  . zolpidem (AMBIEN) tablet 5 mg  5 mg Oral QHS PRN Merton Border, MD        Allergies as of 06/07/2017  . (No Known Allergies)     Review of Systems:    Constitutional: No weight loss, fever or chills Skin: No rash  Cardiovascular: No chest pain Respiratory: No SOB  Gastrointestinal: See HPI and otherwise negative Genitourinary: No dysuria  Neurological: +dizziness Musculoskeletal: No new muscle or joint pain Hematologic: No bruising Psychiatric: No history of depression or anxiety   Physical Exam:  Vital signs in last 24 hours: Temp:  [97.7 F (36.5 C)-98.4 F (36.9 C)] 98.4 F (36.9 C) (03/26 0544) Pulse Rate:  [40-52] 45 (03/26 0544) Resp:  [13-18] 16 (03/26 0544) BP: (127-157)/(50-62) 150/50 (03/26 0544) SpO2:  [94 %-100 %] 94 % (03/26 0544) Weight:  [159 lb (72.1 kg)-159 lb 6.4 oz (  72.3 kg)] 159  lb (72.1 kg) (03/25 1328) Last BM Date: 06/06/17 General:   Pleasantly demented elderly Caucasian female appears to be in NAD, Well developed, Well nourished, alert and cooperative Head:  Normocephalic and atraumatic. Eyes:   PEERL, EOMI. No icterus. Conjunctiva pink. Ears:  Normal auditory acuity. Neck:  Supple Throat: Oral cavity and pharynx without inflammation, swelling or lesion.  Lungs: Respirations even and unlabored. Lungs clear to auscultation bilaterally.   No wheezes, crackles, or rhonchi.  Heart: Normal S1, S2. No MRG. Regular rate and rhythm. No peripheral edema, cyanosis or pallor.  Abdomen:  Soft, nondistended, mild generalized ttp, No rebound or guarding. Normal bowel sounds. No appreciable masses or hepatomegaly. Rectal:  Not performed.  Msk:  Symmetrical without gross deformities. Peripheral pulses intact.  Extremities:  Without edema, no deformity or joint abnormality. Neurologic:  Alert and  oriented x4;  grossly normal neurologically.  Skin:   Dry and intact without significant lesions or rashes. Psychiatric:  Demonstrates good judgement and reason without abnormal affect or behaviors.   LAB RESULTS: Recent Labs    06/07/17 1425 06/07/17 2036 06/08/17 0753  WBC 8.2  --   --   HGB 11.6* 12.0 10.3*  HCT 33.9*  --   --   PLT 217  --   --    BMET Recent Labs    06/07/17 1425  NA 137  K 4.6  CL 110  CO2 19*  GLUCOSE 133*  BUN 62*  CREATININE 2.07*  CALCIUM 9.0   PT/INR Recent Labs    06/07/17 1614 06/08/17 0214  LABPROT 18.5* 16.4*  INR 1.56 1.33    Impression / Plan:   Impression: 1. GI bleed: reported melena 06/07/17 am on Coumadin (last dose 06/06/17) with supratherapeutic INR at 3.6, no further stools since arrival to hospital, hemoglobin 11.6--> 10.3, history of previous bleeds with duodenal ulcer, last 2015; consider most likely upper GI bleed 2. Right leg DVT: dx 2 weeks ago, started on Coumadin 3. Coagulopathy: INR 3.6--> 1.5 after  Kcentra 4. H/o dudoenal ulcer: EGD 2015  Plan: 1. Continue to monitor hgb and transfuse as needed <7 2. IV Protonix BID 3. Continue to hold Coumadin. Patient to remain NPO for now. 4.  Will arrange for EGD +/- enteroscopy today with Dr. Ardis Hughs.  Did discuss risk, benefits, limitations and alternatives and the patient agrees to proceed. 5.  Please await further recommendations from Dr. Ardis Hughs after time procedure.    Thank you for your kind consultation, we will continue to follow.  Lavone Nian [facility]  06/08/2017, 8:50 AM Pager #: [phone]

## 2017-06-08 NOTE — Anesthesia Postprocedure Evaluation (Signed)
Anesthesia Post Note  Patient: Destiny [name]  Procedure(s) Performed: ESOPHAGOGASTRODUODENOSCOPY (EGD) WITH PROPOFOL (N/A )     Anesthesia Post Evaluation  Last Vitals:  Vitals:   06/08/17 1420 06/08/17 1500  BP: (!) 158/46 (!) 154/69  Pulse: (!) 50 61  Resp: 12   Temp:    SpO2: 97%     Last Pain:  Vitals:   06/08/17 1500  TempSrc:   PainSc: 0-No pain                 Leeum Sankey COKER

## 2017-06-08 NOTE — H&P (View-Only) (Signed)
Consultation  Referring Provider:  Dr. Allyson Sabal    Primary Care Physician:  Mosie Lukes, MD Primary Gastroenterologist: Dr. Carlean Purl      Reason for Consultation: Fabienne Bruns Bleed             HPI:   Destiny [name] is an 84 y.o. female with past medical history significant for CKD, CVA, dementia, history of duodenal ulcer and antral erosions and CAS s/op CABG 2010, recently diagnosed with DVT 2 weeks ago and started on Coumadin, who presented to the ED with melena.    Today, patient found to be poor historian due to dementia, but surrounded by family members who provide her history.  Lives at home and has daily assistance from healthcare providers, at 9 AM yesterday was found with dark tarry stool per caretaker, patient recalls possibly passing stool around 7 AM that morning.  When getting up from bed was "wobbly and unstable", proceeded to her primary care provider appointment and was found to have an elevated PT/INR and sent to the [facility] ER.  Was later transferred here.  She has not had another bowel movement since arrival.    Per family this has occurred multiple times before and is always dark tarry stool.  She is on Pantoprazole 40 mg twice daily over the past 2 weeks since being started on Coumadin for recent DVT.  She also takes Zantac 150 milligrams at night.    Denies fever, chills, weight loss, anorexia, nausea, vomiting, abdominal pain or syncope.  ER Course:  given Eppie Gibson and INR dropped to 1.5, hgb 11.3  GI History: 12/29/13-Dr. Deatra Ina, small bowel endoscopy: Single ulcer measuring 8 x 5 mm in size in the duodenal bulb, otherwise normal exam to the jejunum, normal-appearing esophagus and GE junction, stomach well visualized and normal 12/08/11-Dr. Fuller Plan, EGD: Nonbleeding duodenal ulcer 06/25/11-capsule endoscopy: Normal small bowel and stomach 05/20/11-colonoscopy, Dr. Carlean Purl: Severe diverticulosis throughout the colon, no blood or bleeding, otherwise normal colonoscopy, internal  hemorrhoids 05/16/11-EGD, Dr. Carlean Purl: Normal  Past Medical History:  Diagnosis Date  . Acute renal insufficiency 12/24/2013  . Amaurosis fugax   . Arthritis 12/06/2012  . Atherosclerosis   . CAD (coronary artery disease)    a. s/p CABG x 4 (LIMA->LAD, VG->Diag, VG->OM1->OM3);  b. 2010 Cath: 3/4 patent grafts (VG->diag occluded), 3vd, sev PVD ->Med Rx;  c. 12/2011 Lexi MV: sm area of mild mid and apical ant wall ischemia ->med rx.  . Carotid bruit   . Chronic kidney disease (CKD) stage G4/A1, severely decreased glomerular filtration rate (GFR) between 15-29 mL/min/1.73 square meter and albuminuria creatinine ratio less than 30 mg/g (HCC)    GFR 25 on 12/25/13  . CVA (cerebral infarction)   . Dementia    pt denies  . Depression 12/24/2013  . Diabetes mellitus type II    insulin dependent.   . Diabetic peripheral neuropathy ([place])   . Diabetic retinopathy   . Diverticulosis   . Duodenal ulcer 2009 and 11/2011   caused GI bleeding  . Gallstones 2009   seen on CT scan  . GI bleed 04/2011, 11/2011   found non-bleeding duodenal ulcers  . Hyperlipidemia   . Hypersomnolence 03/12/2013  . Hypertension   . Iron deficiency anemia secondary to blood loss (chronic)   . Melena 1015/2015   PEPTIC ULCER    REQUIRING TRANSFUSION  . Pedal edema 10/12/2016  . PVD (peripheral vascular disease) ([place])   . Rib fracture 04/21/2015  . Thiamine deficiency 08/13/2015  .  UTI (urinary tract infection) 04/08/2014  . Weight loss, non-intentional 01/29/2014    Past Surgical History:  Procedure Laterality Date  . ABDOMINAL HYSTERECTOMY    . COLONOSCOPY  05/20/2011   Procedure: COLONOSCOPY;  Surgeon: Scarlette Shorts, MD;  Location: [place];  Service: Endoscopy;  Laterality: N/A;  . CORONARY ARTERY BYPASS GRAFT  1999   x 4  . ENTEROSCOPY N/A 12/29/2013   Procedure: ENTEROSCOPY;  Surgeon: Inda Castle, MD;  Location: [place];  Service: Endoscopy;  Laterality: N/A;  . ESOPHAGOGASTRODUODENOSCOPY  05/16/2011    Procedure: ESOPHAGOGASTRODUODENOSCOPY (EGD);  Surgeon: Gatha Mayer, MD;  Location: [facility] ENDOSCOPY;  Service: Endoscopy;  Laterality: N/A;  . ESOPHAGOGASTRODUODENOSCOPY  12/08/2011   Procedure: ESOPHAGOGASTRODUODENOSCOPY (EGD);  Surgeon: Ladene Artist, MD,FACG;  Location: [facility] ENDOSCOPY;  Service: Endoscopy;  Laterality: N/A;  . UPPER GASTROINTESTINAL ENDOSCOPY  02/22/2008   w/biopsy, duedenal ulcer, antrum erosions    Family History  Problem Relation Age of Onset  . Coronary artery disease Father   . Hypertension Father   . Stroke Father   . Cancer Mother        ?  . Diabetes Maternal Uncle   . Stroke Sister   . Colon cancer Neg Hx      Social History   Tobacco Use  . Smoking status: Former Smoker    Types: Cigarettes    Last attempt to quit: 12/31/2000    Years since quitting: 16.4  . Smokeless tobacco: Never Used  Substance Use Topics  . Alcohol use: No  . Drug use: No    Prior to Admission medications   Medication Sig Start Date End Date Taking? Authorizing Provider  amLODipine (NORVASC) 5 MG tablet Take 1 tablet (5 mg total) by mouth daily. 04/27/17  Yes Mosie Lukes, MD  atorvastatin (LIPITOR) 20 MG tablet Take 1 tablet (20 mg total) by mouth daily. 04/27/17  Yes Mosie Lukes, MD  Cholecalciferol (VITAMIN D) 1000 UNITS capsule Take 1,000 Units by mouth daily.    Yes [provider]  citalopram (CELEXA) 20 MG tablet Take 1 tablet (20 mg total) by mouth daily. 04/27/17  Yes Mosie Lukes, MD  ferrous sulfate 325 (65 FE) MG tablet Take 325 mg daily with breakfast by mouth.   Yes [provider]  Insulin Glargine (LANTUS SOLOSTAR) 100 UNIT/ML Solostar Pen ADMINISTER 35 UNITS UNDER THE SKIN DAILY AT in the morning. INCREASE BY 2 UNITS EVERY 3 DAYS IF ALL FSG>120 03/22/17  Yes Mosie Lukes, MD  insulin lispro (HUMALOG KWIKPEN) 100 UNIT/ML KiwkPen Inject 0.05-0.1 mLs (5-10 Units total) into the skin 3 (three) times daily with meals. 03/02/16  Yes Mosie Lukes, MD  isosorbide mononitrate (IMDUR) 30 MG 24 hr tablet Take 1 tablet (30 mg total) by mouth at bedtime. Patient taking differently: Take 30 mg by mouth daily with supper.  04/27/17  Yes Mosie Lukes, MD  isosorbide mononitrate (IMDUR) 60 MG 24 hr tablet TAKE 60 mg TABLET BY MOUTH EVERY DAY in the morning 04/27/17  Yes Mosie Lukes, MD  metoprolol tartrate (LOPRESSOR) 25 MG tablet Take 0.5 tablets (12.5 mg total) by mouth 2 (two) times daily. 05/27/17  Yes Aline August, MD  Omega-3 Fatty Acids (FISH OIL) 1200 MG CAPS Take 1,200 mg by mouth daily.    Yes [provider]  pantoprazole (PROTONIX) 40 MG tablet Take 40 mg by mouth daily.    Yes [provider]  ranitidine (ZANTAC) 300 MG tablet  Take 1/2 tablet at bedtime 02/16/17  Yes Renato Shin, MD  thiamine (VITAMIN B-1) 100 MG tablet Take 1 tablet (100 mg total) by mouth daily. Patient taking differently: Take 100 mg See admin instructions by mouth. Monday, Wednesday and Friday 02/24/16  Yes Mosie Lukes, MD  triamterene-hydrochlorothiazide (MAXZIDE-25) 37.5-25 MG tablet Take 0.5 tablets by mouth daily. 03/22/17  Yes Mosie Lukes, MD  warfarin (COUMADIN) 5 MG tablet Take 1 tablet (5 mg total) by mouth daily. Patient taking differently: Take 2.5-5 mg by mouth as directed. Take 0.5 tablet (2.5 mg) on (Mon, Wed, Fri, Sat) and Take 1 tablet (5 mg) on (Sun, Tues, Thurs) 05/27/17 06/26/17 Yes Aline August, MD  albuterol (PROAIR HFA) 108 (90 Base) MCG/ACT inhaler Inhale 2 puffs into the lungs every 6 (six) hours as needed for wheezing or shortness of breath. 05/27/17   Saguier, Percell Miller, PA-C  ipratropium (ATROVENT HFA) 17 MCG/ACT inhaler Inhale 2 puffs into the lungs every 6 (six) hours as needed for wheezing. 05/21/17   Saguier, Percell Miller, PA-C  nitroGLYCERIN (NITROSTAT) 0.4 MG SL tablet Place 0.4 mg under the tongue every 5 (five) minutes as needed for chest pain.    [provider]    Current Facility-Administered  Medications  Medication Dose Route Frequency Provider Last Rate Last Dose  . 0.9 %  sodium chloride infusion   Intravenous Continuous Merton Border, MD 50 mL/hr at 06/07/17 2042    . 0.9 %  sodium chloride infusion   Intravenous Continuous Merton Border, MD 75 mL/hr at 06/07/17 2049    . atorvastatin (LIPITOR) tablet 20 mg  20 mg Oral Daily Merton Border, MD      . citalopram (CELEXA) tablet 20 mg  20 mg Oral Daily Merton Border, MD      . ipratropium-albuterol (DUONEB) 0.5-2.5 (3) MG/3ML nebulizer solution 3 mL  3 mL Nebulization Q6H PRN Merton Border, MD      . isosorbide mononitrate (IMDUR) 24 hr tablet 30 mg  30 mg Oral QHS Merton Border, MD   30 mg at 06/07/17 2212  . metoprolol tartrate (LOPRESSOR) tablet 12.5 mg  12.5 mg Oral BID Kirby-Graham, Karsten Fells, NP      . nitroGLYCERIN (NITROSTAT) SL tablet 0.4 mg  0.4 mg Sublingual Q5 min PRN Merton Border, MD      . pantoprazole (PROTONIX) injection 40 mg  40 mg Intravenous Q12H Merton Border, MD   40 mg at 06/07/17 2212  . phytonadione (VITAMIN K) 10 mg in dextrose 5 % 50 mL IVPB  10 mg Intravenous Once Merton Border, MD   Stopped at 06/07/17 1543  . zolpidem (AMBIEN) tablet 5 mg  5 mg Oral QHS PRN Merton Border, MD        Allergies as of 06/07/2017  . (No Known Allergies)     Review of Systems:    Constitutional: No weight loss, fever or chills Skin: No rash  Cardiovascular: No chest pain Respiratory: No SOB  Gastrointestinal: See HPI and otherwise negative Genitourinary: No dysuria  Neurological: +dizziness Musculoskeletal: No new muscle or joint pain Hematologic: No bruising Psychiatric: No history of depression or anxiety   Physical Exam:  Vital signs in last 24 hours: Temp:  [97.7 F (36.5 C)-98.4 F (36.9 C)] 98.4 F (36.9 C) (03/26 0544) Pulse Rate:  [40-52] 45 (03/26 0544) Resp:  [13-18] 16 (03/26 0544) BP: (127-157)/(50-62) 150/50 (03/26 0544) SpO2:  [94 %-100 %] 94 % (03/26 0544) Weight:  [159 lb (72.1 kg)-159 lb 6.4 oz (  72.3 kg)] 159  lb (72.1 kg) (03/25 1328) Last BM Date: 06/06/17 General:   Pleasantly demented elderly Caucasian female appears to be in NAD, Well developed, Well nourished, alert and cooperative Head:  Normocephalic and atraumatic. Eyes:   PEERL, EOMI. No icterus. Conjunctiva pink. Ears:  Normal auditory acuity. Neck:  Supple Throat: Oral cavity and pharynx without inflammation, swelling or lesion.  Lungs: Respirations even and unlabored. Lungs clear to auscultation bilaterally.   No wheezes, crackles, or rhonchi.  Heart: Normal S1, S2. No MRG. Regular rate and rhythm. No peripheral edema, cyanosis or pallor.  Abdomen:  Soft, nondistended, mild generalized ttp, No rebound or guarding. Normal bowel sounds. No appreciable masses or hepatomegaly. Rectal:  Not performed.  Msk:  Symmetrical without gross deformities. Peripheral pulses intact.  Extremities:  Without edema, no deformity or joint abnormality. Neurologic:  Alert and  oriented x4;  grossly normal neurologically.  Skin:   Dry and intact without significant lesions or rashes. Psychiatric:  Demonstrates good judgement and reason without abnormal affect or behaviors.   LAB RESULTS: Recent Labs    06/07/17 1425 06/07/17 2036 06/08/17 0753  WBC 8.2  --   --   HGB 11.6* 12.0 10.3*  HCT 33.9*  --   --   PLT 217  --   --    BMET Recent Labs    06/07/17 1425  NA 137  K 4.6  CL 110  CO2 19*  GLUCOSE 133*  BUN 62*  CREATININE 2.07*  CALCIUM 9.0   PT/INR Recent Labs    06/07/17 1614 06/08/17 0214  LABPROT 18.5* 16.4*  INR 1.56 1.33    Impression / Plan:   Impression: 1. GI bleed: reported melena 06/07/17 am on Coumadin (last dose 06/06/17) with supratherapeutic INR at 3.6, no further stools since arrival to hospital, hemoglobin 11.6--> 10.3, history of previous bleeds with duodenal ulcer, last 2015; consider most likely upper GI bleed 2. Right leg DVT: dx 2 weeks ago, started on Coumadin 3. Coagulopathy: INR 3.6--> 1.5 after  Kcentra 4. H/o dudoenal ulcer: EGD 2015  Plan: 1. Continue to monitor hgb and transfuse as needed <7 2. IV Protonix BID 3. Continue to hold Coumadin. Patient to remain NPO for now. 4.  Will arrange for EGD +/- enteroscopy today with Dr. Ardis Hughs.  Did discuss risk, benefits, limitations and alternatives and the patient agrees to proceed. 5.  Please await further recommendations from Dr. Ardis Hughs after time procedure.    Thank you for your kind consultation, we will continue to follow.  Lavone Nian [facility]  06/08/2017, 8:50 AM Pager #: [phone]

## 2017-06-08 NOTE — Transfer of Care (Signed)
Immediate Anesthesia Transfer of Care Note  Patient: Destiny [name]  Procedure(s) Performed: ESOPHAGOGASTRODUODENOSCOPY (EGD) WITH PROPOFOL (N/A ) ENTEROSCOPY (N/A )  Patient Location: PACU  Anesthesia Type:MAC  Level of Consciousness: sedated  Airway & Oxygen Therapy: Patient Spontanous Breathing and Patient connected to nasal cannula oxygen  Post-op Assessment: Report given to RN and Post -op Vital signs reviewed and stable  Post vital signs: Reviewed and stable  Last Vitals:  Vitals Value Taken Time  BP    Temp    Pulse 51 06/08/2017  2:02 PM  Resp 19 06/08/2017  2:02 PM  SpO2 94 % 06/08/2017  2:02 PM  Vitals shown include unvalidated device data.  Last Pain:  Vitals:   06/08/17 1327  TempSrc: Oral  PainSc: 0-No pain         Complications: No apparent anesthesia complications

## 2017-06-08 NOTE — Op Note (Signed)
[facility] Patient Name: Analiz Tvedt Procedure Date: 06/08/2017 MRN: [id number] Attending MD: Milus Banister , MD Date of Birth: [birth year] CSN: [id number] Age: 84 Admit Type: Inpatient Procedure:                Upper GI endoscopy Indications:              Melena (dark stools after 3 weeks of coumadin,                            supratherapeutic) Providers:                Milus Banister, MD, Cleda Daub, RN, Tinnie Gens, Technician, Derrek Gu. Alday CRNA, CRNA Referring MD:              Medicines:                Monitored Anesthesia Care Complications:            No immediate complications. Estimated blood loss:                            None. Estimated Blood Loss:     Estimated blood loss: none. Procedure:                Pre-Anesthesia Assessment:                           - Prior to the procedure, a History and Physical                            was performed, and patient medications and                            allergies were reviewed. The patient's tolerance of                            previous anesthesia was also reviewed. The risks                            and benefits of the procedure and the sedation                            options and risks were discussed with the patient.                            All questions were answered, and informed consent                            was obtained. Prior Anticoagulants: The patient has                            taken Coumadin (warfarin), last dose was 2 days  prior to procedure. ASA Grade Assessment: III - A                            patient with severe systemic disease. After                            reviewing the risks and benefits, the patient was                            deemed in satisfactory condition to undergo the                            procedure.                           After obtaining informed consent, the endoscope was                        passed under direct vision. Throughout the                            procedure, the patient's blood pressure, pulse, and                            oxygen saturations were monitored continuously. The                            EG-[id number] ([id number]) scope was introduced through the                            mouth, and advanced to the second part of duodenum.                            The upper GI endoscopy was accomplished without                            difficulty. The patient tolerated the procedure                            well. Scope In: Scope Out: Findings:      Minimal inflammation was found in the gastric antrum.      The exam was otherwise without abnormality. Impression:               - Very minimal gastritis.                           - These findings do not explain her overt bleeding                            (dark stools). Her last colonoscopy was 6 years ago                            (showed pan-colonic diverticulosis).                           -  Given her need to resume blood thinner (recently                            diagnosed DVT) and the fact that her last                            colonoscopy was 6 years ago (05/2011 Dr. Carlean Purl                            found pan-colonic diverticulosis) should consider                            repeat colonoscopy tomorrow to exclude significant                            (neoplastic) lesions. Moderate Sedation:      N/A- Per Anesthesia Care Recommendation:           - Return to hospital room.                           - Colonoscopy tomorrow, clears OK tonight. Procedure Code(s):        --- Professional ---                           [phone], Esophagogastroduodenoscopy, flexible,                            transoral; diagnostic, including collection of                            specimen(s) by brushing or washing, when performed                            (separate procedure) Diagnosis Code(s):        ---  Professional ---                           K29.70, Gastritis, unspecified, without bleeding                           K92.1, Melena (includes Hematochezia) CPT copyright 2016 [employer]. All rights reserved. The codes documented in this report are preliminary and upon coder review may  be revised to meet current compliance requirements. Milus Banister, MD 06/08/2017 2:10:02 PM This report has been signed electronically. Number of Addenda: 0

## 2017-06-08 NOTE — Progress Notes (Addendum)
Patient ID: Destiny [name], female   DOB: 17-Jul-1931, 84 y.o.   MRN: [id number] Request received for IVC filter placement in patient.  Imaging studies and past history have been reviewed by Dr. Earleen Newport.  Details/risks of IVC filter placement, including but not limited to, internal bleeding, infection, filter migration and thrombosis, possible permanent nature of filter were discussed with patient's daughter-in-law.  She wishes to discuss matter further with additional family members as well as cardiologist and await endoscopy findings before making final decision.Will cont to monitor.

## 2017-06-08 NOTE — Progress Notes (Signed)
[facility] PROGRESS NOTE  Destiny [name] CHY:[id number] DOB: Nov 10, 1931 DOA: 06/07/2017   PCP: Mosie Lukes, MD     Assessment/Plan: Active Problems:   Lower GI bleed   GI bleed  84 y.o. female, with PMHx sig.for CKD , CVA, dementia , history of duodenal ulcer, antrum erosions and CAD S/P CABG 2010 who was recently diagnosed with DVT around 2 weeks ago, started on Coumadin . Now presenting with melena.  Assessment and plan GI bleeding Coagulopathy due to Coumadin reversed with Kcentra, INR 1.24 Hemoglobin stable GI had seen patient today and is contemplating EGD, patient is nothing by mouth  Recent left lower extremity DVT 2 weeks ago Patient started on Coumadin, recommended to follow-up with cardiology Dr. Admission for repeat INR Discussed with patient's daughter who is a pharmacist by the bedside, offered IVC filter placement Patient's daughter would like to speak to interventional radiology and consider options  Atrial fibrillation, paroxysmal CHADSVASC: 7HTN (1), age greater than 22 (2), TIA/stroke (2), female (1), diabetes (1) Continue metoprolol and hold Coumadin Rate is controlled NOACs not an option in light of CKD  CKD, stage IV, stable -Stable and at baseline.   Outpatient follow-up with primary care provider  Type 2 diabetes A1c 8.4 (01/2017) Patient is on Lantus and Humalog Will continue patient on Humalog sliding scale insulin for now pending EGD  Hypertension, not at goal -Blood pressure stable. continue   isosorbide and metoprolol, patients Norvasc, Maxzide is on hold  COPD, stable -Outpatient follow-up  GERD, stable Currently on Protonix 40 mg IV every 12  CAD status post CABG x4 (2010), stable No chest pain Continue home Imdur 60 mg a.m., 30 mg p.m. Daughter states that the patient has not been on Plavix, since she started Coumadin.    Hyperlipidemia -Continue home atorvastatin  Depression, stable -Continue  Celexa  Iron deficiency anemia, chronic, stable -Outpatient follow-up.  Continue home oral iron  Mild dementia -Has failed prior trials of Aricept and Namenda  DVT prophylaxsis scd  Code Status:  Full code    Family Communication: Discussed in detail with the patient, all imaging results, lab results explained to the patient   Disposition Plan:   Pending EGD evaluation      Consultants:  gastroenterology  Procedures:  none  Antibiotics: Anti-infectives (From admission, onward)   None         HPI/Subjective: No further melena since admission, daughter who is a pharmacist is by the bedside along with her husband  Objective: Vitals:   06/07/17 1727 06/07/17 1816 06/07/17 1933 06/08/17 0544  BP: (!) 133/52 (!) 127/55 (!) 157/58 (!) 150/50  Pulse: (!) 40 (!) 43 (!) 42 (!) 45  Resp: 18 18 16 16   Temp:   98 F (36.7 C) 98.4 F (36.9 C)  TempSrc:   Oral Oral  SpO2: 99% 100% 96% 94%  Weight:      Height:        Intake/Output Summary (Last 24 hours) at 06/08/2017 0934 Last data filed at 06/08/2017 0600 Gross per 24 hour  Intake 928.75 ml  Output -  Net 928.75 ml    Exam:  Examination:  General exam: Appears calm and comfortable  Respiratory system: Clear to auscultation. Respiratory effort normal. Cardiovascular system: S1 & S2 heard, RRR. No JVD, murmurs, rubs, gallops or clicks. No pedal edema. Gastrointestinal system: Abdomen is nondistended, soft and nontender. No organomegaly or masses felt. Normal bowel sounds heard. Central nervous system: Alert  and oriented. No focal neurological deficits. Extremities: Symmetric 5 x 5 power. Skin: No rashes, lesions or ulcers Psychiatry: Judgement and insight appear normal. Mood & affect appropriate.     Data Reviewed: I have personally reviewed following labs and imaging studies  Micro Results No results found for this or any previous visit (from the past 240 hour(s)).  Radiology Reports Dg Chest 2  View  Result Date: 05/21/2017 CLINICAL DATA:  Cough and intermittent wheezing. EXAM: CHEST - 2 VIEW COMPARISON:  02/01/2017 FINDINGS: Chronic cardiomegaly. There is aortic tortuosity that is stable. Status post CABG. The lungs are clear. Mild hyperinflation. No acute osseous finding. IMPRESSION: 1. No evidence of active disease. 2. Chronic cardiomegaly. Electronically Signed   By: Monte Fantasia M.D.   On: 05/21/2017 17:57   Ct Head Wo Contrast  Result Date: 05/21/2017 CLINICAL DATA:  84 year old female with increased dizziness. EXAM: CT HEAD WITHOUT CONTRAST TECHNIQUE: Contiguous axial images were obtained from the base of the skull through the vertex without intravenous contrast. COMPARISON:  02/02/2017 CT and prior studies FINDINGS: Brain: No evidence of acute infarction, hemorrhage, hydrocephalus, extra-axial collection or mass lesion/mass effect. Atrophy, chronic small-vessel white matter ischemic changes again noted. Remote bilateral basal ganglia, periventricular white matter and RIGHT parietal/occipital infarcts again noted. Vascular: Atherosclerotic calcifications again noted. Skull: Normal. Negative for fracture or focal lesion. Sinuses/Orbits: No acute abnormality Other: None IMPRESSION: 1. No evidence of acute intracranial abnormality 2. Atrophy, chronic small-vessel white matter ischemic changes and remote infarcts as described. Electronically Signed   By: Margarette Canada M.D.   On: 05/21/2017 18:01   Mr Brain Wo Contrast  Result Date: 05/22/2017 CLINICAL DATA:  Ataxia.  History of stroke. EXAM: MRI HEAD WITHOUT CONTRAST TECHNIQUE: Multiplanar, multiecho pulse sequences of the brain and surrounding structures were obtained without intravenous contrast. COMPARISON:  Head CT 05/21/2017 and MRI 02/02/2017 FINDINGS: Brain: There is no evidence of acute infarct, intracranial hemorrhage, mass, midline shift, or extra-axial fluid collection. A partially empty sella is again incidentally noted. There is  moderate cerebral atrophy. Chronic infarcts are again seen in the left greater than right basal ganglia, deep white matter on the left, and right occipital lobe. There also may be a tiny chronic left cerebellar infarct. T2 hyperintensities elsewhere in the cerebral white matter bilaterally are similar to the prior MRI and nonspecific but compatible with moderate chronic small vessel ischemic disease. Ex vacuo enlargement of the left lateral ventricle and a cavum septum pellucidum et vergae are noted. Vascular: Stable abnormal appearance of the distal right V4 segment likely reflecting chronic occlusion distal to PICA. Other major intracranial vascular flow voids are preserved. Skull and upper cervical spine: Unremarkable bone marrow signal. Sinuses/Orbits: Bilateral cataract extraction. No significant sinus disease. Other: None. IMPRESSION: 1. No acute intracranial abnormality. 2. Moderate chronic small vessel ischemic disease and cerebral atrophy with chronic infarcts as above. Electronically Signed   By: Logan Bores M.D.   On: 05/22/2017 10:51   US Venous Img Lower Unilateral Left  Result Date: 05/21/2017 CLINICAL DATA:  Left knee weakness and swelling for 2 days. EXAM: LEFT LOWER EXTREMITY VENOUS DOPPLER ULTRASOUND TECHNIQUE: Gray-scale sonography with graded compression, as well as color Doppler and duplex ultrasound were performed to evaluate the lower extremity deep venous systems from the level of the common femoral vein and including the common femoral, femoral, profunda femoral, popliteal and calf veins including the posterior tibial, peroneal and gastrocnemius veins when visible. The superficial great saphenous vein was also interrogated. Spectral  Doppler was utilized to evaluate flow at rest and with distal augmentation maneuvers in the common femoral, femoral and popliteal veins. COMPARISON:  None. FINDINGS: Contralateral Common Femoral Vein: Respiratory phasicity is normal and symmetric with the  symptomatic side. No evidence of thrombus. Normal compressibility. Common Femoral Vein: No evidence of thrombus. Normal compressibility, respiratory phasicity and response to augmentation. Saphenofemoral Junction: No evidence of thrombus. Normal compressibility and flow on color Doppler imaging. Profunda Femoral Vein: No evidence of thrombus. Normal compressibility and flow on color Doppler imaging. Femoral Vein: No evidence of thrombus. Normal compressibility, respiratory phasicity and response to augmentation. Popliteal Vein: No evidence of thrombus. Normal compressibility, respiratory phasicity and response to augmentation. Calf Veins: There are multiple noncompressible gastrocnemius veins. Some of these veins have occlusive thrombus and others have nonocclusive thrombus. Visualized posterior tibial veins are compressible without thrombus. Superficial Great Saphenous Vein: No evidence of thrombus. Normal compressibility. Other Findings: Small fluid collection in the popliteal fossa measures 3.5 x 0.7 x 1.6 cm. Findings are compatible with a small Baker's cyst. IMPRESSION: Positive for DVT in the left calf. Evidence for thrombus in left gastrocnemius veins. Small left knee Baker's cyst. Electronically Signed   By: Markus Daft M.D.   On: 05/21/2017 18:03     CBC Recent Labs  Lab 06/07/17 1425 06/07/17 2036 06/08/17 0753 06/08/17 0850  WBC 8.2  --   --  6.8  HGB 11.6* 12.0 10.3* 11.1*  HCT 33.9*  --   --  33.0*  PLT 217  --   --  171  MCV 90.6  --   --  91.2  MCH 31.0  --   --  30.7  MCHC 34.2  --   --  33.6  RDW 13.0  --   --  13.1  LYMPHSABS 2.4  --   --   --   MONOABS 0.8  --   --   --   EOSABS 0.3  --   --   --   BASOSABS 0.0  --   --   --     Chemistries  Recent Labs  Lab 06/07/17 1425  NA 137  K 4.6  CL 110  CO2 19*  GLUCOSE 133*  BUN 62*  CREATININE 2.07*  CALCIUM 9.0    ------------------------------------------------------------------------------------------------------------------ estimated creatinine clearance is 20.6 mL/min (A) (by C-G formula based on SCr of 2.07 mg/dL (H)). ------------------------------------------------------------------------------------------------------------------ No results for input(s): HGBA1C in the last 72 hours. ------------------------------------------------------------------------------------------------------------------ No results for input(s): CHOL, HDL, LDLCALC, TRIG, CHOLHDL, LDLDIRECT in the last 72 hours. ------------------------------------------------------------------------------------------------------------------ No results for input(s): TSH, T4TOTAL, T3FREE, THYROIDAB in the last 72 hours.  Invalid input(s): FREET3 ------------------------------------------------------------------------------------------------------------------ No results for input(s): VITAMINB12, FOLATE, FERRITIN, TIBC, IRON, RETICCTPCT in the last 72 hours.  Coagulation profile Recent Labs  Lab 06/07/17 06/07/17 1425 06/07/17 1614 06/08/17 0214 06/08/17 0850  INR 3.6 3.31 1.56 1.33 1.24    No results for input(s): DDIMER in the last 72 hours.  Cardiac Enzymes No results for input(s): CKMB, TROPONINI, MYOGLOBIN in the last 168 hours.  Invalid input(s): CK ------------------------------------------------------------------------------------------------------------------ Invalid input(s): POCBNP   CBG: No results for input(s): GLUCAP in the last 168 hours.     Studies: No results found.    Lab Results  Component Value Date   HGBA1C 8.4 (H) 02/02/2017   HGBA1C 8.4 (H) 01/26/2017   HGBA1C 8.6 (H) 10/12/2016   Lab Results  Component Value Date   MICROALBUR 97.4 (H) 11/26/2015   LDLCALC 77 02/02/2017  CREATININE 2.07 (H) 06/07/2017       Scheduled Meds: . atorvastatin  20 mg Oral Daily  . citalopram  20 mg  Oral Daily  . isosorbide mononitrate  30 mg Oral QHS  . metoprolol tartrate  12.5 mg Oral BID  . pantoprazole (PROTONIX) IV  40 mg Intravenous Q12H   Continuous Infusions: . sodium chloride 50 mL/hr at 06/07/17 2042  . sodium chloride 75 mL/hr at 06/07/17 2049  . phytonadione (VITAMIN K) IV Stopped (06/07/17 1543)     LOS: 1 day    Time spent: >30 MINS    Reyne Dumas  [facility] Pager [phone]. If 7PM-7AM, please contact night-coverage at [email or web address], password [facility] 06/08/2017, 9:34 AM  LOS: 1 day

## 2017-06-08 NOTE — Progress Notes (Signed)
Pharmacy - [facility] monitoring  Assessment: Patient received KCentra x 2 doses yesterday for severe rectal bleeding. Warfarin held; INR 3.6 on admission, down to 1.24 this AM  Plan: GI holding warfarin INR stable, no further reversal needed at this time  Reuel Boom, PharmD, BCPS [phone] 06/08/2017, 2:09 PM

## 2017-06-09 ENCOUNTER — Encounter (HOSPITAL_COMMUNITY): Payer: Self-pay | Admitting: Anesthesiology

## 2017-06-09 ENCOUNTER — Encounter (HOSPITAL_COMMUNITY)
Admission: EM | Disposition: A | Discharge status: Home Health | Payer: Self-pay | Source: Home / Self Care | Attending: Internal Medicine

## 2017-06-09 ENCOUNTER — Inpatient Hospital Stay (HOSPITAL_COMMUNITY): Payer: Medicare Other | Admitting: Anesthesiology

## 2017-06-09 DIAGNOSIS — K5730 Diverticulosis of large intestine without perforation or abscess without bleeding: Secondary | ICD-10-CM

## 2017-06-09 DIAGNOSIS — K284 Chronic or unspecified gastrojejunal ulcer with hemorrhage: Secondary | ICD-10-CM

## 2017-06-09 HISTORY — PX: COLONOSCOPY WITH PROPOFOL: SHX5780

## 2017-06-09 LAB — URINALYSIS, ROUTINE W REFLEX MICROSCOPIC
BACTERIA UA: NONE SEEN
BILIRUBIN URINE: NEGATIVE
Glucose, UA: 50 mg/dL — AB
Hgb urine dipstick: NEGATIVE
KETONES UR: NEGATIVE mg/dL
Leukocytes, UA: NEGATIVE
Nitrite: NEGATIVE
PROTEIN: 100 mg/dL — AB
Specific Gravity, Urine: 1.008 (ref 1.005–1.030)
pH: 5 (ref 5.0–8.0)

## 2017-06-09 LAB — COMPREHENSIVE METABOLIC PANEL
ALBUMIN: 3.3 g/dL — AB (ref 3.5–5.0)
ALT: 16 U/L (ref 14–54)
AST: 14 U/L — AB (ref 15–41)
Alkaline Phosphatase: 65 U/L (ref 38–126)
Anion gap: 10 (ref 5–15)
BILIRUBIN TOTAL: 1.3 mg/dL — AB (ref 0.3–1.2)
BUN: 31 mg/dL — AB (ref 6–20)
CHLORIDE: 111 mmol/L (ref 101–111)
CO2: 20 mmol/L — ABNORMAL LOW (ref 22–32)
CREATININE: 1.64 mg/dL — AB (ref 0.44–1.00)
Calcium: 9.1 mg/dL (ref 8.9–10.3)
GFR calc Af Amer: 32 mL/min — ABNORMAL LOW (ref >60)
GFR calc non Af Amer: 28 mL/min — ABNORMAL LOW (ref >60)
GLUCOSE: 166 mg/dL — AB (ref 65–99)
POTASSIUM: 4.3 mmol/L (ref 3.5–5.1)
Sodium: 141 mmol/L (ref 135–145)
TOTAL PROTEIN: 6.4 g/dL — AB (ref 6.5–8.1)

## 2017-06-09 LAB — GLUCOSE, CAPILLARY
GLUCOSE-CAPILLARY: 256 mg/dL — AB (ref 65–99)
GLUCOSE-CAPILLARY: 98 mg/dL (ref 65–99)
Glucose-Capillary: 173 mg/dL — ABNORMAL HIGH (ref 65–99)
Glucose-Capillary: 145 mg/dL — ABNORMAL HIGH (ref 65–99)
Glucose-Capillary: 115 mg/dL — ABNORMAL HIGH (ref 65–99)

## 2017-06-09 LAB — PROTIME-INR
INR: 1.26
PROTHROMBIN TIME: 15.7 s — AB (ref 11.4–15.2)

## 2017-06-09 LAB — CBC
HEMATOCRIT: 33.8 % — AB (ref 36.0–46.0)
Hemoglobin: 11.4 g/dL — ABNORMAL LOW (ref 12.0–15.0)
MCH: 31.1 pg (ref 26.0–34.0)
MCHC: 33.7 g/dL (ref 30.0–36.0)
MCV: 92.1 fL (ref 78.0–100.0)
PLATELETS: 186 10*3/uL (ref 150–400)
RBC: 3.67 MIL/uL — ABNORMAL LOW (ref 3.87–5.11)
RDW: 13.2 % (ref 11.5–15.5)
WBC: 9 10*3/uL (ref 4.0–10.5)

## 2017-06-09 SURGERY — COLONOSCOPY WITH PROPOFOL
Anesthesia: Monitor Anesthesia Care

## 2017-06-09 MED ORDER — AMLODIPINE BESYLATE 5 MG PO TABS
5.0000 mg | ORAL_TABLET | Freq: Every day | ORAL | Status: DC
  Administered 2017-06-09 – 2017-06-10 (×2): 5 mg via ORAL
  Filled 2017-06-09 (×2): qty 1

## 2017-06-09 MED ORDER — ISOSORBIDE MONONITRATE ER 30 MG PO TB24
30.0000 mg | ORAL_TABLET | Freq: Every day | ORAL | Status: DC
  Administered 2017-06-10: 30 mg via ORAL
  Filled 2017-06-09: qty 1

## 2017-06-09 MED ORDER — ACETAMINOPHEN 325 MG PO TABS
650.0000 mg | ORAL_TABLET | Freq: Four times a day (QID) | ORAL | Status: DC | PRN
Start: 2017-06-09 — End: 2017-06-10
  Administered 2017-06-09: 650 mg via ORAL
  Filled 2017-06-09: qty 2

## 2017-06-09 MED ORDER — PANTOPRAZOLE SODIUM 40 MG PO TBEC
40.0000 mg | DELAYED_RELEASE_TABLET | Freq: Every day | ORAL | Status: DC
  Administered 2017-06-10: 40 mg via ORAL
  Filled 2017-06-09: qty 1

## 2017-06-09 MED ORDER — PROPOFOL 10 MG/ML IV BOLUS
INTRAVENOUS | Status: AC
  Filled 2017-06-09: qty 60

## 2017-06-09 MED ORDER — WARFARIN - PHARMACIST DOSING INPATIENT
Freq: Every day | Status: DC
  Administered 2017-06-09: 18:00:00

## 2017-06-09 MED ORDER — FUROSEMIDE 10 MG/ML IJ SOLN
40.0000 mg | Freq: Once | INTRAMUSCULAR | Status: AC
  Administered 2017-06-09: 40 mg via INTRAVENOUS
  Filled 2017-06-09: qty 4

## 2017-06-09 MED ORDER — FERROUS SULFATE 325 (65 FE) MG PO TABS
325.0000 mg | ORAL_TABLET | Freq: Every day | ORAL | Status: DC
  Administered 2017-06-10: 325 mg via ORAL
  Filled 2017-06-09: qty 1

## 2017-06-09 MED ORDER — WARFARIN SODIUM 5 MG PO TABS
5.0000 mg | ORAL_TABLET | Freq: Once | ORAL | Status: AC
  Administered 2017-06-09: 5 mg via ORAL
  Filled 2017-06-09: qty 1

## 2017-06-09 MED ORDER — PROPOFOL 500 MG/50ML IV EMUL
INTRAVENOUS | Status: DC | PRN
  Administered 2017-06-09: 200 ug/kg/min via INTRAVENOUS

## 2017-06-09 MED ORDER — INSULIN GLARGINE 100 UNIT/ML ~~LOC~~ SOLN
10.0000 [IU] | Freq: Every day | SUBCUTANEOUS | Status: DC
  Administered 2017-06-09: 10 [IU] via SUBCUTANEOUS
  Filled 2017-06-09 (×2): qty 0.1

## 2017-06-09 SURGICAL SUPPLY — 22 items

## 2017-06-09 NOTE — Anesthesia Procedure Notes (Signed)
Procedure Name: MAC Date/Time: 06/09/2017 10:12 AM Performed by: Lissa Morales, CRNA Pre-anesthesia Checklist: Patient identified, Emergency Drugs available, Suction available, Patient being monitored and Timeout performed Oxygen Delivery Method: Simple face mask Placement Confirmation: positive ETCO2

## 2017-06-09 NOTE — Care Management Note (Signed)
Case Management Note  Patient Details  Name: Destiny [name] MRN: [id number] Date of Birth: Jul 27, 1931  Subjective/Objective: PT recc HHPT-dtr chose [facility] rep Santiago Glad aware.                   Action/Plan:d/c home w/HHC.   Expected Discharge Date:                  Expected Discharge Plan:  [place]  In-House Referral:     Discharge planning Services  CM Consult  Post Acute Care Choice:  Home Health(Active w/[facility] HHPT) Choice offered to:     DME Arranged:    DME Agency:     HH Arranged:    HH Agency:     Status of Service:  In process, will continue to follow  If discussed at Long Length of Stay Meetings, dates discussed:    Additional Comments:  [name], RN 06/09/2017, 3:52 PM

## 2017-06-09 NOTE — Anesthesia Postprocedure Evaluation (Signed)
Anesthesia Post Note  Patient: Destiny [name]  Procedure(s) Performed: COLONOSCOPY WITH PROPOFOL (N/A )     Patient location during evaluation: PACU Anesthesia Type: MAC Level of consciousness: awake and alert Pain management: pain level controlled Vital Signs Assessment: post-procedure vital signs reviewed and stable Respiratory status: spontaneous breathing, nonlabored ventilation, respiratory function stable and patient connected to nasal cannula oxygen Cardiovascular status: stable and blood pressure returned to baseline Postop Assessment: no apparent nausea or vomiting Anesthetic complications: no    Last Vitals:  Vitals:   06/09/17 1130 06/09/17 1153  BP: (!) 174/62   Pulse: (!) 44   Resp: 18   Temp:    SpO2: 100% 96%    Last Pain:  Vitals:   06/09/17 1048  TempSrc: Oral  PainSc: 0-No pain                 Effie Berkshire

## 2017-06-09 NOTE — Evaluation (Signed)
Physical Therapy Evaluation Patient Details Name: Destiny [name] MRN: [id number] DOB: 1931-09-03 Today's Date: 06/09/2017   History of Present Illness  84 yo female admitted with LGIB. Hx of CAD, CABG, CVA, neuropathy, DM, DVT, CKD.   Clinical Impression  On eval, pt required Min assist for mobility. She walked ~115 feet without an assistive device. Pt presents with general weakness, decreased activity tolerance, and impaired gait and balance. Dyspnea 2/4 with ambulation. Pt reported LE fatigue and weakness after ambulation. No family present during session. Recommend HHPT and 24 hour supervision/assist. Will follow and progress activity as tolerated.     Follow Up Recommendations Home health PT;Supervision/Assistance - 24 hour    Equipment Recommendations  None recommended by PT    Recommendations for Other Services       Precautions / Restrictions Precautions Precautions: Fall Restrictions Weight Bearing Restrictions: No      Mobility  Bed Mobility Overal bed mobility: Modified Independent                Transfers Overall transfer level: Needs assistance   Transfers: Sit to/from Stand Transfer assistance: Min guard assist           General transfer comment: close guard for safety.   Ambulation/Gait Ambulation/Gait assistance: Min assist Ambulation Distance (Feet): 115 Feet Assistive device: None Gait Pattern/deviations: Decreased stride length;Drifts right/left;Step-through pattern     General Gait Details: Intermittent assist for steadying. Pt reported LE weakness after ~100 feet. Dyspnea 2/4  Stairs            Wheelchair Mobility    Modified Rankin (Stroke Patients Only)       Balance Overall balance assessment: History of Falls;Needs assistance           Standing balance-Leahy Scale: Fair Standing balance comment: steady with static stance.                              Pertinent Vitals/Pain Pain Assessment:  No/denies pain    Home Living Family/patient expects to be discharged to:: Private residence Living Arrangements: Alone Available Help at Discharge: Family;Personal care attendant Type of Home: House Home Access: Stairs to enter Entrance Stairs-Rails: None Entrance Stairs-Number of Steps: 3 Home Layout: One level Home Equipment: None      Prior Function Level of Independence: Needs assistance   Gait / Transfers Assistance Needed: does not use AD  ADL's / Homemaking Assistance Needed: Aide helps with ADLs and iADLs  Comments: Home health aide M-F 4 hrs/day      Hand Dominance   Dominant Hand: Right    Extremity/Trunk Assessment   Upper Extremity Assessment Upper Extremity Assessment: Overall WFL for tasks assessed    Lower Extremity Assessment Lower Extremity Assessment: Generalized weakness    Cervical / Trunk Assessment Cervical / Trunk Assessment: Normal  Communication      Cognition Arousal/Alertness: Awake/alert Behavior During Therapy: WFL for tasks assessed/performed Overall Cognitive Status: History of cognitive impairments - at baseline                                        General Comments      Exercises     Assessment/Plan    PT Assessment Patient needs continued PT services  PT Problem List Decreased mobility;Decreased balance;Decreased safety awareness;Decreased activity tolerance;Decreased cognition       PT Treatment  Interventions Functional mobility training;Gait training;Therapeutic activities;Therapeutic exercise;Balance training;Patient/family education;DME instruction    PT Goals (Current goals can be found in the Care Plan section)  Acute Rehab PT Goals Patient Stated Goal: none stated PT Goal Formulation: With patient Time For Goal Achievement: 06/23/17 Potential to Achieve Goals: Good    Frequency Min 3X/week   Barriers to discharge        Co-evaluation               AM-PAC PT "6 Clicks" Daily  Activity  Outcome Measure Difficulty turning over in bed (including adjusting bedclothes, sheets and blankets)?: None Difficulty moving from lying on back to sitting on the side of the bed? : None Difficulty sitting down on and standing up from a chair with arms (e.g., wheelchair, bedside commode, etc,.)?: A Little Help needed moving to and from a bed to chair (including a wheelchair)?: A Little Help needed walking in hospital room?: A Little Help needed climbing 3-5 steps with a railing? : A Little 6 Click Score: 20    End of Session Equipment Utilized During Treatment: Gait belt Activity Tolerance: Patient tolerated treatment well Patient left: in bed;with call bell/phone within reach;with bed alarm set   PT Visit Diagnosis: Unsteadiness on feet (R26.81);Muscle weakness (generalized) (M62.81);Difficulty in walking, not elsewhere classified (R26.2);Other abnormalities of gait and mobility (R26.89)    Time: 3838-1840 PT Time Calculation (min) (ACUTE ONLY): 9 min   Charges:   PT Evaluation $PT Eval Moderate Complexity: 1 Mod     PT G Codes:          Weston Anna, MPT Pager: [phone]

## 2017-06-09 NOTE — Anesthesia Preprocedure Evaluation (Addendum)
Anesthesia Evaluation  Patient identified by MRN, date of birth, ID band Patient awake    Reviewed: Allergy & Precautions, NPO status , Patient's Chart, lab work & pertinent test results, reviewed documented beta blocker date and time   Airway Mallampati: I  TM Distance: >3 FB Neck ROM: Full    Dental  (+) Upper Dentures, Lower Dentures   Pulmonary former smoker,    breath sounds clear to auscultation       Cardiovascular hypertension, Pt. on home beta blockers and Pt. on medications + CAD, + CABG and + Peripheral Vascular Disease   Rhythm:Regular Rate:Bradycardia     Neuro/Psych PSYCHIATRIC DISORDERS Depression Dementia  Neuromuscular disease CVA    GI/Hepatic PUD, GERD  Medicated and Controlled,  Endo/Other  diabetes, Type 2, Insulin Dependent  Renal/GU Renal disease     Musculoskeletal  (+) Arthritis ,   Abdominal Normal abdominal exam  (+)   Peds  Hematology  (+) anemia ,   Anesthesia Other Findings   Reproductive/Obstetrics                            Lab Results  Component Value Date   WBC 9.0 06/09/2017   HGB 11.4 (L) 06/09/2017   HCT 33.8 (L) 06/09/2017   MCV 92.1 06/09/2017   PLT 186 06/09/2017   Lab Results  Component Value Date   CREATININE 1.64 (H) 06/09/2017   BUN 31 (H) 06/09/2017   NA 141 06/09/2017   K 4.3 06/09/2017   CL 111 06/09/2017   CO2 20 (L) 06/09/2017   Echo: - Left ventricle: The cavity size was normal. Wall thickness was   normal. Systolic function was normal. The estimated ejection   fraction was in the range of 50% to 55%. Wall motion was normal;   there were no regional wall motion abnormalities. - Mitral valve: There was mild regurgitation. - Pulmonary arteries: Systolic pressure was mildly increased. PA   peak pressure: 35 mm Hg (S).  Anesthesia Physical Anesthesia Plan  ASA: III  Anesthesia Plan: MAC   Post-op Pain Management:     Induction: Intravenous  PONV Risk Score and Plan: 0 and Propofol infusion  Airway Management Planned: Simple Face Mask  Additional Equipment: None  Intra-op Plan:   Post-operative Plan:   Informed Consent: I have reviewed the patients History and Physical, chart, labs and discussed the procedure including the risks, benefits and alternatives for the proposed anesthesia with the patient or authorized representative who has indicated his/her understanding and acceptance.     Plan Discussed with: CRNA  Anesthesia Plan Comments:         Anesthesia Quick Evaluation

## 2017-06-09 NOTE — Progress Notes (Signed)
ANTICOAGULATION CONSULT NOTE - Initial Consult  Pharmacy Consult for warfarin Indication: VTE prophylaxis  No Known Allergies  Patient Measurements: Height: 5\' 6"  (167.6 cm) Weight: 159 lb (72.1 kg) IBW/kg (Calculated) : 59.3 Heparin Dosing Weight:   Vital Signs: Temp: 98.3 F (36.8 C) (03/27 1300) Temp Source: Oral (03/27 1300) BP: 139/66 (03/27 1300) Pulse Rate: 67 (03/27 1300)  Labs: Recent Labs    06/07/17 1425  06/08/17 0214  06/08/17 0850 06/08/17 2100 06/09/17 0536 06/09/17 0847  HGB 11.6*   < > 10.8*   < > 11.1* 13.0  --  11.4*  HCT 33.9*  --  31.1*  --  33.0* 39.1  --  33.8*  PLT 217  --  178  --  171 207  --  186  LABPROT 33.4*   < > 16.4*  --  15.5*  --  15.7*  --   INR 3.31   < > 1.33  --  1.24  --  1.26  --   CREATININE 2.07*  --   --   --   --   --  1.64*  --    < > = values in this interval not displayed.    Estimated Creatinine Clearance: 26 mL/min (A) (by C-G formula based on SCr of 1.64 mg/dL (H)).   Medical History: Past Medical History:  Diagnosis Date  . Acute renal insufficiency 12/24/2013  . Amaurosis fugax   . Arthritis 12/06/2012  . Atherosclerosis   . CAD (coronary artery disease)    a. s/p CABG x 4 (LIMA->LAD, VG->Diag, VG->OM1->OM3);  b. 2010 Cath: 3/4 patent grafts (VG->diag occluded), 3vd, sev PVD ->Med Rx;  c. 12/2011 Lexi MV: sm area of mild mid and apical ant wall ischemia ->med rx.  . Carotid bruit   . Chronic kidney disease (CKD) stage G4/A1, severely decreased glomerular filtration rate (GFR) between 15-29 mL/min/1.73 square meter and albuminuria creatinine ratio less than 30 mg/g (HCC)    GFR 25 on 12/25/13  . CVA (cerebral infarction)   . Dementia    pt denies  . Depression 12/24/2013  . Diabetes mellitus type II    insulin dependent.   . Diabetic peripheral neuropathy ([place])   . Diabetic retinopathy   . Diverticulosis   . Duodenal ulcer 2009 and 11/2011   caused GI bleeding  . Gallstones 2009   seen on CT scan  . GI  bleed 04/2011, 11/2011   found non-bleeding duodenal ulcers  . Hyperlipidemia   . Hypersomnolence 03/12/2013  . Hypertension   . Iron deficiency anemia secondary to blood loss (chronic)   . Melena 1015/2015   PEPTIC ULCER    REQUIRING TRANSFUSION  . Pedal edema 10/12/2016  . PVD (peripheral vascular disease) ([place])   . Rib fracture 04/21/2015  . Thiamine deficiency 08/13/2015  . UTI (urinary tract infection) 04/08/2014  . Weight loss, non-intentional 01/29/2014    Medications:  Scheduled:  . atorvastatin  20 mg Oral Daily  . citalopram  20 mg Oral Daily  . insulin aspart  0-9 Units Subcutaneous Q4H  . insulin glargine  10 Units Subcutaneous QHS  . isosorbide mononitrate  30 mg Oral QHS  . metoprolol tartrate  12.5 mg Oral BID    Assessment: Pharmacy is consulted to resumed warfarin in 84 yo female with PMH of lower extremity DVT. Pt presented to the ED with severe rectal bleeding and was given Vitamin K 10 mg IV x1 and Kcentra 1662 units on 3/25   Pt underwent  colonoscopy on 3/26 and was found to not have  no recent or old blood in the colon. The bleeding has stopped. Pan-colonic diverticulosis noted, which is likely the source of her bleeding (in setting of  supratherapeutic coumadin, INR was 5.9 one week PTA and 3.6 at admit). GI recommends restarting warfarin  Home med regimen was 2.5 mg daily on MWFSat and 5 mg on SunTuesThur.   Today, 06/09/17  INR 1.26  Hgb 11.4, plt 186   No noted further bleeding    Goal of Therapy:  INR 2-3 Monitor platelets by anticoagulation protocol: Yes   Plan:   Warfarin 5 mg PO x1   Daily INR   Monitor clinical course, renal function, cultures as available  CBC ordered with AM labs    Royetta Asal, PharmD, BCPS Pager [phone] 06/09/2017 2:39 PM

## 2017-06-09 NOTE — Op Note (Signed)
[facility] Patient Name: Destiny [name] Procedure Date: 06/09/2017 MRN: [id number] Attending MD: Milus Banister , MD Date of Birth: 13-Mar-1932 CSN: [id number] Age: 84 Admit Type: Inpatient Procedure:                Colonoscopy Indications:              Melena, EGD yesterday did not explain her bleeding.                            All in setting of recent coumadin, supratherapeutic                            (INR 5.9 one week PTA, 3.6 at admit) Providers:                Milus Banister, MD, Cleda Daub, RN, Tinnie Gens, Technician, Enrigue Catena, CRNA Referring MD:              Medicines:                Monitored Anesthesia Care Complications:            No immediate complications. Estimated blood loss:                            None. Estimated Blood Loss:     Estimated blood loss: none. Procedure:                Pre-Anesthesia Assessment:                           - Prior to the procedure, a History and Physical                            was performed, and patient medications and                            allergies were reviewed. The patient's tolerance of                            previous anesthesia was also reviewed. The risks                            and benefits of the procedure and the sedation                            options and risks were discussed with the patient.                            All questions were answered, and informed consent                            was obtained. Prior Anticoagulants: The patient has  taken Coumadin (warfarin), last dose was 3 days                            prior to procedure. ASA Grade Assessment: III - A                            patient with severe systemic disease. After                            reviewing the risks and benefits, the patient was                            deemed in satisfactory condition to undergo the   procedure.                           After obtaining informed consent, the colonoscope                            was passed under direct vision. Throughout the                            procedure, the patient's blood pressure, pulse, and                            oxygen saturations were monitored continuously. The                            EC-[id number] ([id number]) scope was introduced through                            the anus and advanced to the the cecum, identified                            by appendiceal orifice and ileocecal valve. The                            colonoscopy was performed without difficulty. The                            patient tolerated the procedure well. The quality                            of the bowel preparation was good. The ileocecal                            valve, appendiceal orifice, and rectum were                            photographed. Scope In: 10:20:51 AM Scope Out: 10:36:41 AM Scope Withdrawal Time: 0 hours 5 minutes 58 seconds  Total Procedure Duration: 0 hours 15 minutes 50 seconds  Findings:      There was no recent or old blood in the colon.      Multiple small and  large-mouthed diverticula were found in the entire       colon.      Two small sessile polyps in transverse (2-45mm each), not resected due to       age, comorbid conditions, discussion with son yesterday.      The exam was otherwise without abnormality on direct and retroflexion       views. Impression:               - There was no recent or old blood in the colon.                            Her bleeding has clearly stopped.                           - Pan-colonic diverticulosis noted, this is likely                            the source of her bleeding (in setting of                            supratherapeutic coumadin, INR was 5.9 one week PTA                            and 3.6 at admit) Moderate Sedation:      N/A- Per Anesthesia Care Recommendation:           - Return to  hospital for further care. Will allow                            regular diet.                           - No need for further testing at this point.                           - OK to resume her coumadin if it is still needed,                            should strive to keep INR from getting too elevated                            again.                           - Please call, page with any further questions or                            concerns. Procedure Code(s):        --- Professional ---                           [phone], Colonoscopy, flexible; diagnostic, including                            collection of specimen(s) by brushing or washing,  when performed (separate procedure) Diagnosis Code(s):        --- Professional ---                           K92.1, Melena (includes Hematochezia)                           K57.30, Diverticulosis of large intestine without                            perforation or abscess without bleeding CPT copyright 2016 [employer]. All rights reserved. The codes documented in this report are preliminary and upon coder review may  be revised to meet current compliance requirements. Milus Banister, MD 06/09/2017 10:47:30 AM This report has been signed electronically. Number of Addenda: 0

## 2017-06-09 NOTE — Transfer of Care (Signed)
Immediate Anesthesia Transfer of Care Note  Patient: Destiny [name]  Procedure(s) Performed: COLONOSCOPY WITH PROPOFOL (N/A )  Patient Location: PACU  Anesthesia Type:MAC  Level of Consciousness: sedated and patient cooperative  Airway & Oxygen Therapy: Patient Spontanous Breathing and Patient connected to face mask oxygen  Post-op Assessment: Report given to RN and Post -op Vital signs reviewed and stable  Post vital signs: stable  Last Vitals:  Vitals Value Taken Time  BP 118/33 06/09/2017 10:45 AM  Temp    Pulse 50 06/09/2017 10:47 AM  Resp 18 06/09/2017 10:47 AM  SpO2 100 % 06/09/2017 10:47 AM  Vitals shown include unvalidated device data.  Last Pain:  Vitals:   06/09/17 1045  TempSrc:   PainSc: 0-No pain         Complications: No apparent anesthesia complications

## 2017-06-09 NOTE — Interval H&P Note (Signed)
History and Physical Interval Note:  06/09/2017 9:37 AM  Destiny [name]  has presented today for surgery, with the diagnosis of melena  The various methods of treatment have been discussed with the patient and family. After consideration of risks, benefits and other options for treatment, the patient has consented to  Procedure(s): COLONOSCOPY WITH PROPOFOL (N/A) as a surgical intervention .  The patient's history has been reviewed, patient examined, no change in status, stable for surgery.  I have reviewed the patient's chart and labs.  Questions were answered to the patient's satisfaction.     Milus Banister

## 2017-06-09 NOTE — Progress Notes (Addendum)
[facility] PROGRESS NOTE  Destiny [name] JGO:[id number] DOB: 1932/02/12 DOA: 06/07/2017   PCP: Mosie Lukes, MD     Assessment/Plan: Active Problems:   Lower GI bleed   GI bleed   Gastritis and gastroduodenitis   Diverticulosis of colon without hemorrhage  84 y.o. female, with PMHx sig.for CKD , CVA, dementia , history of duodenal ulcer, antrum erosions and CAD S/P CABG 2010 who was recently diagnosed with DVT around 2 weeks ago, started on Coumadin . Now presenting with melena.  Assessment and plan GI bleeding Coagulopathy due to Coumadin reversed with Kcentra,vitamin K INR 1.24 Hemoglobin stable GI had seen patient , status post EGD  Which showed minimal gastritis.colonoscopy did not show any recent or old blood. She had pan diverticulosis which is likely the source of her bleeding, in the setting of elevated INR which was 5.9 one week ago. Resume Coumadin today and follow INR Patient does not need IVC filter, as no obvious contraindications found to anticoagulation at this time However if the patient has recurrent bleeding, will need IVC filter  Recent left lower extremity DVT 2 weeks ago Patient restarted on Coumadin, per pharmacy. Will not bridge due to increased risk of recurrent GI bleeding. INR drift up, with close outpatient INR monitoring Discussed with patient's daughter who is a pharmacist by the bedside,  She is aware of GI findings as above    Atrial fibrillation, paroxysmal, currently sinus bradycardia CHADSVASC: 7HTN (1), age greater than 72 (2), TIA/stroke (2), female (1), diabetes (1) discontinued metoprolol as patient has a history of resting bradycardia , resume Coumadin Rate is controlled NOACs not an option in light of CKD  CKD, stage IV, stable -Stable and at baseline.   Outpatient follow-up with primary care provider  Type 2 diabetes A1c 8.4 (01/2017) Patient is on Lantus [dose reduced] and Humalog  Hypertension, not at  goal -Blood pressure stable. continue   Isosorbide,resume Norvasc, Maxzide is on hold  COPD, stable -Outpatient follow-up  GERD, stable  continue PPI  CAD status post CABG x4 (2010), stable No chest pain Continue home Imdur   Daughter states that the patient has not been on Plavix, since she started Coumadin.    Hyperlipidemia -Continue home atorvastatin  Depression, stable -Continue Celexa  Iron deficiency anemia, chronic, stable -Outpatient follow-up.  Continue home oral iron  Mild dementia -Has failed prior trials of Aricept and Namenda  DVT prophylaxsis scd  Code Status:  Full code    Family Communication: Discussed in detail with the patient, all imaging results, lab results explained to the patient   Disposition Plan:    Anticipate discharge tomorrow once patient's shortness of breath improves and coumadin is resumed, blood pressure parameters improve      Consultants:  gastroenterology  Procedures:  none  Antibiotics: Anti-infectives (From admission, onward)   None         HPI/Subjective: Metoprolol held due to bradycardia in the 30s and 40s,hypertensive this morning, status post colonoscopy,with ome wheezing noted. IV fluids discontinued and patient received 1 dose of IV Lasix No further melena since admission,   Objective: Vitals:   06/09/17 1048 06/09/17 1109 06/09/17 1130 06/09/17 1153  BP: (!) 138/33 (!) 160/41 (!) 174/62   Pulse: (!) 50 (!) 50 (!) 44   Resp: 15 16 18    Temp: 99.3 F (37.4 C)     TempSrc: Oral     SpO2: 100% 97% 100% 96%  Weight:  Height:        Intake/Output Summary (Last 24 hours) at 06/09/2017 1217 Last data filed at 06/09/2017 1047 Gross per 24 hour  Intake 3056.25 ml  Output -  Net 3056.25 ml    Exam:  Examination:  General exam: Appears calm and comfortable  Respiratory system: slight wheezing. Respiratory effort normal. Cardiovascular system: S1 & S2 heard, RRR. No JVD, murmurs, rubs,  gallops or clicks. No pedal edema. Gastrointestinal system: Abdomen is nondistended, soft and nontender. No organomegaly or masses felt. Normal bowel sounds heard. Central nervous system: Alert and oriented. No focal neurological deficits. Extremities: Symmetric 5 x 5 power. Skin: No rashes, lesions or ulcers Psychiatry: Judgement and insight appear normal. Mood & affect appropriate.     Data Reviewed: I have personally reviewed following labs and imaging studies  Micro Results No results found for this or any previous visit (from the past 240 hour(s)).  Radiology Reports Dg Chest 2 View  Result Date: 05/21/2017 CLINICAL DATA:  Cough and intermittent wheezing. EXAM: CHEST - 2 VIEW COMPARISON:  02/01/2017 FINDINGS: Chronic cardiomegaly. There is aortic tortuosity that is stable. Status post CABG. The lungs are clear. Mild hyperinflation. No acute osseous finding. IMPRESSION: 1. No evidence of active disease. 2. Chronic cardiomegaly. Electronically Signed   By: Monte Fantasia M.D.   On: 05/21/2017 17:57   Ct Head Wo Contrast  Result Date: 05/21/2017 CLINICAL DATA:  84 year old female with increased dizziness. EXAM: CT HEAD WITHOUT CONTRAST TECHNIQUE: Contiguous axial images were obtained from the base of the skull through the vertex without intravenous contrast. COMPARISON:  02/02/2017 CT and prior studies FINDINGS: Brain: No evidence of acute infarction, hemorrhage, hydrocephalus, extra-axial collection or mass lesion/mass effect. Atrophy, chronic small-vessel white matter ischemic changes again noted. Remote bilateral basal ganglia, periventricular white matter and RIGHT parietal/occipital infarcts again noted. Vascular: Atherosclerotic calcifications again noted. Skull: Normal. Negative for fracture or focal lesion. Sinuses/Orbits: No acute abnormality Other: None IMPRESSION: 1. No evidence of acute intracranial abnormality 2. Atrophy, chronic small-vessel white matter ischemic changes and  remote infarcts as described. Electronically Signed   By: Margarette Canada M.D.   On: 05/21/2017 18:01   Mr Brain Wo Contrast  Result Date: 05/22/2017 CLINICAL DATA:  Ataxia.  History of stroke. EXAM: MRI HEAD WITHOUT CONTRAST TECHNIQUE: Multiplanar, multiecho pulse sequences of the brain and surrounding structures were obtained without intravenous contrast. COMPARISON:  Head CT 05/21/2017 and MRI 02/02/2017 FINDINGS: Brain: There is no evidence of acute infarct, intracranial hemorrhage, mass, midline shift, or extra-axial fluid collection. A partially empty sella is again incidentally noted. There is moderate cerebral atrophy. Chronic infarcts are again seen in the left greater than right basal ganglia, deep white matter on the left, and right occipital lobe. There also may be a tiny chronic left cerebellar infarct. T2 hyperintensities elsewhere in the cerebral white matter bilaterally are similar to the prior MRI and nonspecific but compatible with moderate chronic small vessel ischemic disease. Ex vacuo enlargement of the left lateral ventricle and a cavum septum pellucidum et vergae are noted. Vascular: Stable abnormal appearance of the distal right V4 segment likely reflecting chronic occlusion distal to PICA. Other major intracranial vascular flow voids are preserved. Skull and upper cervical spine: Unremarkable bone marrow signal. Sinuses/Orbits: Bilateral cataract extraction. No significant sinus disease. Other: None. IMPRESSION: 1. No acute intracranial abnormality. 2. Moderate chronic small vessel ischemic disease and cerebral atrophy with chronic infarcts as above. Electronically Signed   By: Seymour Bars.D.  On: 05/22/2017 10:51   US Venous Img Lower Unilateral Left  Result Date: 05/21/2017 CLINICAL DATA:  Left knee weakness and swelling for 2 days. EXAM: LEFT LOWER EXTREMITY VENOUS DOPPLER ULTRASOUND TECHNIQUE: Gray-scale sonography with graded compression, as well as color Doppler and duplex  ultrasound were performed to evaluate the lower extremity deep venous systems from the level of the common femoral vein and including the common femoral, femoral, profunda femoral, popliteal and calf veins including the posterior tibial, peroneal and gastrocnemius veins when visible. The superficial great saphenous vein was also interrogated. Spectral Doppler was utilized to evaluate flow at rest and with distal augmentation maneuvers in the common femoral, femoral and popliteal veins. COMPARISON:  None. FINDINGS: Contralateral Common Femoral Vein: Respiratory phasicity is normal and symmetric with the symptomatic side. No evidence of thrombus. Normal compressibility. Common Femoral Vein: No evidence of thrombus. Normal compressibility, respiratory phasicity and response to augmentation. Saphenofemoral Junction: No evidence of thrombus. Normal compressibility and flow on color Doppler imaging. Profunda Femoral Vein: No evidence of thrombus. Normal compressibility and flow on color Doppler imaging. Femoral Vein: No evidence of thrombus. Normal compressibility, respiratory phasicity and response to augmentation. Popliteal Vein: No evidence of thrombus. Normal compressibility, respiratory phasicity and response to augmentation. Calf Veins: There are multiple noncompressible gastrocnemius veins. Some of these veins have occlusive thrombus and others have nonocclusive thrombus. Visualized posterior tibial veins are compressible without thrombus. Superficial Great Saphenous Vein: No evidence of thrombus. Normal compressibility. Other Findings: Small fluid collection in the popliteal fossa measures 3.5 x 0.7 x 1.6 cm. Findings are compatible with a small Baker's cyst. IMPRESSION: Positive for DVT in the left calf. Evidence for thrombus in left gastrocnemius veins. Small left knee Baker's cyst. Electronically Signed   By: Markus Daft M.D.   On: 05/21/2017 18:03     CBC Recent Labs  Lab 06/07/17 1425  06/08/17 0214  06/08/17 0753 06/08/17 0850 06/08/17 2100 06/09/17 0847  WBC 8.2  --  6.9  --  6.8 8.6 9.0  HGB 11.6*   < > 10.8* 10.3* 11.1* 13.0 11.4*  HCT 33.9*  --  31.1*  --  33.0* 39.1 33.8*  PLT 217  --  178  --  171 207 186  MCV 90.6  --  90.9  --  91.2 93.3 92.1  MCH 31.0  --  31.6  --  30.7 31.0 31.1  MCHC 34.2  --  34.7  --  33.6 33.2 33.7  RDW 13.0  --  13.2  --  13.1 13.3 13.2  LYMPHSABS 2.4  --   --   --   --   --   --   MONOABS 0.8  --   --   --   --   --   --   EOSABS 0.3  --   --   --   --   --   --   BASOSABS 0.0  --   --   --   --   --   --    < > = values in this interval not displayed.    Chemistries  Recent Labs  Lab 06/07/17 1425 06/09/17 0536  NA 137 141  K 4.6 4.3  CL 110 111  CO2 19* 20*  GLUCOSE 133* 166*  BUN 62* 31*  CREATININE 2.07* 1.64*  CALCIUM 9.0 9.1  AST  --  14*  ALT  --  16  ALKPHOS  --  65  BILITOT  --  1.3*   ------------------------------------------------------------------------------------------------------------------  estimated creatinine clearance is 26 mL/min (A) (by C-G formula based on SCr of 1.64 mg/dL (H)). ------------------------------------------------------------------------------------------------------------------ No results for input(s): HGBA1C in the last 72 hours. ------------------------------------------------------------------------------------------------------------------ No results for input(s): CHOL, HDL, LDLCALC, TRIG, CHOLHDL, LDLDIRECT in the last 72 hours. ------------------------------------------------------------------------------------------------------------------ No results for input(s): TSH, T4TOTAL, T3FREE, THYROIDAB in the last 72 hours.  Invalid input(s): FREET3 ------------------------------------------------------------------------------------------------------------------ No results for input(s): VITAMINB12, FOLATE, FERRITIN, TIBC, IRON, RETICCTPCT in the last 72 hours.  Coagulation profile Recent  Labs  Lab 06/07/17 1425 06/07/17 1614 06/08/17 0214 06/08/17 0850 06/09/17 0536  INR 3.31 1.56 1.33 1.24 1.26    No results for input(s): DDIMER in the last 72 hours.  Cardiac Enzymes No results for input(s): CKMB, TROPONINI, MYOGLOBIN in the last 168 hours.  Invalid input(s): CK ------------------------------------------------------------------------------------------------------------------ Invalid input(s): POCBNP   CBG: Recent Labs  Lab 06/08/17 1954 06/08/17 2354 06/09/17 0408 06/09/17 0813 06/09/17 1159  GLUCAP 186* 90 173* 145* 115*       Studies: No results found.    Lab Results  Component Value Date   HGBA1C 8.4 (H) 02/02/2017   HGBA1C 8.4 (H) 01/26/2017   HGBA1C 8.6 (H) 10/12/2016   Lab Results  Component Value Date   MICROALBUR 97.4 (H) 11/26/2015   LDLCALC 77 02/02/2017   CREATININE 1.64 (H) 06/09/2017       Scheduled Meds: . atorvastatin  20 mg Oral Daily  . citalopram  20 mg Oral Daily  . furosemide  40 mg Intravenous Once  . insulin aspart  0-9 Units Subcutaneous Q4H  . isosorbide mononitrate  30 mg Oral QHS  . metoprolol tartrate  12.5 mg Oral BID  . pantoprazole (PROTONIX) IV  40 mg Intravenous Q12H   Continuous Infusions: . phytonadione (VITAMIN K) IV Stopped (06/07/17 1543)     LOS: 2 days    Time spent: >30 MINS    Reyne Dumas  [facility] Pager [phone]. If 7PM-7AM, please contact night-coverage at [email or web address], password [facility] 06/09/2017, 12:17 PM  LOS: 2 days

## 2017-06-09 NOTE — Progress Notes (Signed)
Notified Dr. Allyson Sabal of patient's elevated blood pressure and that HR has been in the 40s-50s, patient wheezing,  fluid stopped and lasix given, will continue to assess patient.

## 2017-06-09 NOTE — Progress Notes (Signed)
Patient's temp 100.8, notified Dr. Allyson Sabal tylenol, blood culture and urinalysis ordered. Family at the bedside, updated. Will continue to assess patient.

## 2017-06-10 ENCOUNTER — Telehealth: Payer: Self-pay | Admitting: Pharmacist

## 2017-06-10 ENCOUNTER — Encounter (HOSPITAL_COMMUNITY): Payer: Self-pay | Admitting: Gastroenterology

## 2017-06-10 DIAGNOSIS — I82401 Acute embolism and thrombosis of unspecified deep veins of right lower extremity: Secondary | ICD-10-CM

## 2017-06-10 DIAGNOSIS — K625 Hemorrhage of anus and rectum: Secondary | ICD-10-CM

## 2017-06-10 DIAGNOSIS — K922 Gastrointestinal hemorrhage, unspecified: Secondary | ICD-10-CM

## 2017-06-10 DIAGNOSIS — K2990 Gastroduodenitis, unspecified, without bleeding: Secondary | ICD-10-CM

## 2017-06-10 LAB — BASIC METABOLIC PANEL
Anion gap: 10 (ref 5–15)
BUN: 31 mg/dL — AB (ref 6–20)
CHLORIDE: 109 mmol/L (ref 101–111)
CO2: 22 mmol/L (ref 22–32)
CREATININE: 1.81 mg/dL — AB (ref 0.44–1.00)
Calcium: 8.6 mg/dL — ABNORMAL LOW (ref 8.9–10.3)
GFR calc Af Amer: 28 mL/min — ABNORMAL LOW (ref >60)
GFR calc non Af Amer: 25 mL/min — ABNORMAL LOW (ref >60)
Glucose, Bld: 187 mg/dL — ABNORMAL HIGH (ref 65–99)
POTASSIUM: 3.7 mmol/L (ref 3.5–5.1)
Sodium: 141 mmol/L (ref 135–145)

## 2017-06-10 LAB — CBC
HEMATOCRIT: 32.5 % — AB (ref 36.0–46.0)
HEMOGLOBIN: 10.9 g/dL — AB (ref 12.0–15.0)
MCH: 30.7 pg (ref 26.0–34.0)
MCHC: 33.5 g/dL (ref 30.0–36.0)
MCV: 91.5 fL (ref 78.0–100.0)
Platelets: 167 10*3/uL (ref 150–400)
RBC: 3.55 MIL/uL — AB (ref 3.87–5.11)
RDW: 13.2 % (ref 11.5–15.5)
WBC: 10.4 10*3/uL (ref 4.0–10.5)

## 2017-06-10 LAB — GLUCOSE, CAPILLARY
GLUCOSE-CAPILLARY: 130 mg/dL — AB (ref 65–99)
Glucose-Capillary: 164 mg/dL — ABNORMAL HIGH (ref 65–99)
Glucose-Capillary: 143 mg/dL — ABNORMAL HIGH (ref 65–99)
Glucose-Capillary: 369 mg/dL — ABNORMAL HIGH (ref 65–99)
Glucose-Capillary: 123 mg/dL — ABNORMAL HIGH (ref 65–99)

## 2017-06-10 LAB — PROTIME-INR
INR: 1.4
Prothrombin Time: 17.1 seconds — ABNORMAL HIGH (ref 11.4–15.2)

## 2017-06-10 MED ORDER — WARFARIN SODIUM 5 MG PO TABS: ORAL_TABLET | ORAL | 0 refills | Status: DC

## 2017-06-10 MED ORDER — IPRATROPIUM-ALBUTEROL 0.5-2.5 (3) MG/3ML IN SOLN: 3.0000 mL | Freq: Four times a day (QID) | RESPIRATORY_TRACT | 1 refills | Status: DC | PRN

## 2017-06-10 MED ORDER — INSULIN GLARGINE 100 UNIT/ML ~~LOC~~ SOLN: 10.0000 [IU] | Freq: Every day | SUBCUTANEOUS | 1 refills | Status: DC

## 2017-06-10 MED ORDER — WARFARIN SODIUM 5 MG PO TABS
5.0000 mg | ORAL_TABLET | Freq: Once | ORAL | Status: AC
  Administered 2017-06-10: 5 mg via ORAL
  Filled 2017-06-10: qty 1

## 2017-06-10 NOTE — Care Management Note (Signed)
Case Management Note  Patient Details  Name: Destiny [name] MRN: [id number] Date of Birth: 05/07/1931  Subjective/Objective:  [facility] rep Santiago Glad aware of HHRN-pt/inr check on 06/14/17, & all other [place] services. No further CM needs.                  Action/[place]/c home w/HHC.:   Expected Discharge Date:  06/10/17               Expected Discharge Plan:  [place]  In-House Referral:     Discharge planning Services  CM Consult  Post Acute Care Choice:  Home Health, Resumption of Svcs/PTA Provider(Active w/[facility] HHPT) Choice offered to:  Adult Children  DME Arranged:    DME Agency:     HH Arranged:  PT, RN, OT, Nurse's Aide [place] Agency:  [place]  Status of Service:  Completed, signed off  If discussed at [place] of Stay Meetings, dates discussed:    Additional Comments:  [name], RN 06/10/2017, 5:51 PM

## 2017-06-10 NOTE — Care Management Important Message (Signed)
Important Message  Patient Details  Name: Destiny [name] MRN: [id number] Date of Birth: Jun 26, 1931   Medicare Important Message Given:  Yes    Kerin Salen 06/10/2017, 11:36 AMImportant Message  Patient Details  Name: Destiny [name] MRN: [id number] Date of Birth: 1931-07-14   Medicare Important Message Given:  Yes    Kerin Salen 06/10/2017, 11:36 AM

## 2017-06-10 NOTE — Discharge Summary (Signed)
Physician Discharge Summary  Destiny [name] INO:[id number] DOB: 1932/01/20 DOA: 06/07/2017  PCP: Mosie Lukes, MD  Admit date: 06/07/2017 Discharge date: 06/10/2017  Admitted From: Home Disposition:  Home with Home Health  Recommendations for Outpatient Follow-up:  1. Follow up with PCP in 1-2 weeks 2. Please obtain BMP/CBC in one week  Discharge Condition: stable CODE STATUS: full Diet recommendation: Heart Healthy / diabetic diet   Brief/Interim Summary: Patient is a 84 y.o.female,with PMHx sig.for CKD ,CVA, dementia,history of duodenal ulcer, antrum erosions and CAD S/P CABG 2010 who was recentlydiagnosed with DVT around 2 weeks ago, started on Coumadin admitted on 06/07/2017 when she presented with melena.  She was admitted with the following problems: GI bleeding Coagulopathy due to Coumadin reversed with Kcentra,vitamin K INR 1.24 Hemoglobin stable GI had seen patient, status post EGD which showed minimal gastritis.colonoscopy did not show any recent or old blood. She had pan diverticulosis which is likely the source of her bleeding, in the setting of elevated INR which was 5.9 one week ago. Her Coumadin was resumed at lower dose.  INR stable. Patient does not need IVC filter, as no obvious contraindications found to anticoagulation at this time She has been discharged home with home health.  Home health nurse will check her INR and report to her PCP for her Coumadin dose to be adjusted. However if the patient has recurrent bleeding, will need IVC filter  Recent left lower extremity DVT 2 weeks ago Patient restarted on Coumadin, per pharmacy. Discussed with patient's daughter who is a Software engineer.  She is aware of GI findings as above.   Atrial fibrillation, paroxysmal, currently sinus bradycardia CHADSVASC: 7HTN (1), age greater than 83 (2), TIA/stroke (2), female (1), diabetes (1) discontinued metoprolol as patient has a history of resting bradycardia, resume  Coumadin Rate is controlled NOACs not an option in light of CKD  CKD, stage IV, stable -Stable and at baseline.Outpatient follow-up with primary care provider  Type 2 diabetes A1c 8.4 (01/2017) Patient is on Lantus [dose reduced] and Humalog. - Blood glucose here is stable because she is on Carbohydrate consistent diet. - Discussed with patient's family and she is unlikely to be compliant with carbohydrate consistent diet then resume home dose. - Advised to follow up with PCP.   Hypertension -Blood pressure stable. continue   - Isosorbide,resume Norvasc, Maxzide discontinued due to acute on chronic renal failure - Advised to follow up with PCP.  COPD, stable -Outpatient follow-up  GERD, stable  continue PPI  CAD status post CABG x4 (2010), stable No chest pain Continue Imdur   Daughter states that the patient has not been on Plavix, since she started Coumadin.    Hyperlipidemia -Continue home atorvastatin  Depression, stable -Continue Celexa  Iron deficiency anemia, chronic, stable -Outpatient follow-up. Continue home oral iron  Mild dementia -Has failed prior trials of Aricept and Namenda - Advised to follow up with PCP.  Discharge Diagnoses:  Active Problems:   Lower GI bleed   GI bleed   Gastritis and gastroduodenitis   Diverticulosis of colon without hemorrhage    Discharge Instructions  Discharge Instructions    AMB Referral to [place] Management   Complete by:  As directed    Please assign to [facility] for DM. Has increased risk of readmission. Written consent obtained. Please contact Jasmain Ahlberg (son) at [phone] for post discharge calls. PCP office [facility] [employer] listed as doing their own toc. Currently at [facility]. Please call with  questions. Thanks. Marthenia Rolling, [place], RN,BSN-THN [facility] [phone]   Reason for consult:  Please assign [facility] RNCM   Diagnoses of:  Diabetes    Expected date of contact:  1-3 days (reserved for hospital discharges)     Allergies as of 06/10/2017   No Known Allergies     Medication List    STOP taking these medications   Insulin Glargine 100 UNIT/ML Solostar Pen Commonly known as:  LANTUS SOLOSTAR Replaced by:  insulin glargine 100 UNIT/ML injection   metoprolol tartrate 25 MG tablet Commonly known as:  LOPRESSOR   ranitidine 300 MG tablet Commonly known as:  ZANTAC   triamterene-hydrochlorothiazide 37.5-25 MG tablet Commonly known as:  MAXZIDE-25     TAKE these medications   albuterol 108 (90 Base) MCG/ACT inhaler Commonly known as:  PROAIR HFA Inhale 2 puffs into the lungs every 6 (six) hours as needed for wheezing or shortness of breath.   amLODipine 5 MG tablet Commonly known as:  NORVASC Take 1 tablet (5 mg total) by mouth daily.   atorvastatin 20 MG tablet Commonly known as:  LIPITOR Take 1 tablet (20 mg total) by mouth daily.   citalopram 20 MG tablet Commonly known as:  CELEXA Take 1 tablet (20 mg total) by mouth daily.   ferrous sulfate 325 (65 FE) MG tablet Take 325 mg daily with breakfast by mouth.   Fish Oil 1200 MG Caps Take 1,200 mg by mouth daily.   insulin glargine 100 UNIT/ML injection Commonly known as:  LANTUS Inject 0.1 mLs (10 Units total) into the skin at bedtime. Replaces:  Insulin Glargine 100 UNIT/ML Solostar Pen   insulin lispro 100 UNIT/ML KiwkPen Commonly known as:  HUMALOG KWIKPEN Inject 0.05-0.1 mLs (5-10 Units total) into the skin 3 (three) times daily with meals.   ipratropium 17 MCG/ACT inhaler Commonly known as:  ATROVENT HFA Inhale 2 puffs into the lungs every 6 (six) hours as needed for wheezing.   ipratropium-albuterol 0.5-2.5 (3) MG/3ML Soln Commonly known as:  DUONEB Take 3 mLs by nebulization every 6 (six) hours as needed.   isosorbide mononitrate 30 MG 24 hr tablet Commonly known as:  IMDUR Take 1 tablet (30 mg total) by mouth at bedtime. What changed:     when to take this  Another medication with the same name was removed. Continue taking this medication, and follow the directions you see here.   nitroGLYCERIN 0.4 MG SL tablet Commonly known as:  NITROSTAT Place 0.4 mg under the tongue every 5 (five) minutes as needed for chest pain.   pantoprazole 40 MG tablet Commonly known as:  PROTONIX Take 40 mg by mouth daily.   thiamine 100 MG tablet Commonly known as:  VITAMIN B-1 Take 1 tablet (100 mg total) by mouth daily. What changed:    when to take this  additional instructions   Vitamin D 1000 units capsule Take 1,000 Units by mouth daily.   warfarin 5 MG tablet Commonly known as:  COUMADIN Take as directed. If you are unsure how to take this medication, talk to your nurse or doctor. Original instructions:  Take 5 mg Monday and Friday.  Take 2.5 mg Tuesday, Thursday, Saturday, Sunday.  PT/INR to be monitored by the home health nurse and referred to primary care for the Coumadin dosage to be adjusted What changed:    how much to take  how to take this  when to take this  additional instructions      Follow-up  Information    [facility] Follow up.   Specialty:  Home Health Services Why:  [facility] physical therapy Contact information: [place] [address] [phone]          No Known Allergies   Procedures/Studies: Dg Chest 2 View  Result Date: 05/21/2017 CLINICAL DATA:  Cough and intermittent wheezing. EXAM: CHEST - 2 VIEW COMPARISON:  02/01/2017 FINDINGS: Chronic cardiomegaly. There is aortic tortuosity that is stable. Status post CABG. The lungs are clear. Mild hyperinflation. No acute osseous finding. IMPRESSION: 1. No evidence of active disease. 2. Chronic cardiomegaly. Electronically Signed   By: Monte Fantasia M.D.   On: 05/21/2017 17:57   Ct Head Wo Contrast  Result Date: 05/21/2017 CLINICAL DATA:  84 year old female with increased dizziness. EXAM: CT HEAD  WITHOUT CONTRAST TECHNIQUE: Contiguous axial images were obtained from the base of the skull through the vertex without intravenous contrast. COMPARISON:  02/02/2017 CT and prior studies FINDINGS: Brain: No evidence of acute infarction, hemorrhage, hydrocephalus, extra-axial collection or mass lesion/mass effect. Atrophy, chronic small-vessel white matter ischemic changes again noted. Remote bilateral basal ganglia, periventricular white matter and RIGHT parietal/occipital infarcts again noted. Vascular: Atherosclerotic calcifications again noted. Skull: Normal. Negative for fracture or focal lesion. Sinuses/Orbits: No acute abnormality Other: None IMPRESSION: 1. No evidence of acute intracranial abnormality 2. Atrophy, chronic small-vessel white matter ischemic changes and remote infarcts as described. Electronically Signed   By: Margarette Canada M.D.   On: 05/21/2017 18:01   Mr Brain Wo Contrast  Result Date: 05/22/2017 CLINICAL DATA:  Ataxia.  History of stroke. EXAM: MRI HEAD WITHOUT CONTRAST TECHNIQUE: Multiplanar, multiecho pulse sequences of the brain and surrounding structures were obtained without intravenous contrast. COMPARISON:  Head CT 05/21/2017 and MRI 02/02/2017 FINDINGS: Brain: There is no evidence of acute infarct, intracranial hemorrhage, mass, midline shift, or extra-axial fluid collection. A partially empty sella is again incidentally noted. There is moderate cerebral atrophy. Chronic infarcts are again seen in the left greater than right basal ganglia, deep white matter on the left, and right occipital lobe. There also may be a tiny chronic left cerebellar infarct. T2 hyperintensities elsewhere in the cerebral white matter bilaterally are similar to the prior MRI and nonspecific but compatible with moderate chronic small vessel ischemic disease. Ex vacuo enlargement of the left lateral ventricle and a cavum septum pellucidum et vergae are noted. Vascular: Stable abnormal appearance of the distal  right V4 segment likely reflecting chronic occlusion distal to PICA. Other major intracranial vascular flow voids are preserved. Skull and upper cervical spine: Unremarkable bone marrow signal. Sinuses/Orbits: Bilateral cataract extraction. No significant sinus disease. Other: None. IMPRESSION: 1. No acute intracranial abnormality. 2. Moderate chronic small vessel ischemic disease and cerebral atrophy with chronic infarcts as above. Electronically Signed   By: Logan Bores M.D.   On: 05/22/2017 10:51   US Venous Img Lower Unilateral Left  Result Date: 05/21/2017 CLINICAL DATA:  Left knee weakness and swelling for 2 days. EXAM: LEFT LOWER EXTREMITY VENOUS DOPPLER ULTRASOUND TECHNIQUE: Gray-scale sonography with graded compression, as well as color Doppler and duplex ultrasound were performed to evaluate the lower extremity deep venous systems from the level of the common femoral vein and including the common femoral, femoral, profunda femoral, popliteal and calf veins including the posterior tibial, peroneal and gastrocnemius veins when visible. The superficial great saphenous vein was also interrogated. Spectral Doppler was utilized to evaluate flow at rest and with distal augmentation maneuvers in the common  femoral, femoral and popliteal veins. COMPARISON:  None. FINDINGS: Contralateral Common Femoral Vein: Respiratory phasicity is normal and symmetric with the symptomatic side. No evidence of thrombus. Normal compressibility. Common Femoral Vein: No evidence of thrombus. Normal compressibility, respiratory phasicity and response to augmentation. Saphenofemoral Junction: No evidence of thrombus. Normal compressibility and flow on color Doppler imaging. Profunda Femoral Vein: No evidence of thrombus. Normal compressibility and flow on color Doppler imaging. Femoral Vein: No evidence of thrombus. Normal compressibility, respiratory phasicity and response to augmentation. Popliteal Vein: No evidence of thrombus.  Normal compressibility, respiratory phasicity and response to augmentation. Calf Veins: There are multiple noncompressible gastrocnemius veins. Some of these veins have occlusive thrombus and others have nonocclusive thrombus. Visualized posterior tibial veins are compressible without thrombus. Superficial Great Saphenous Vein: No evidence of thrombus. Normal compressibility. Other Findings: Small fluid collection in the popliteal fossa measures 3.5 x 0.7 x 1.6 cm. Findings are compatible with a small Baker's cyst. IMPRESSION: Positive for DVT in the left calf. Evidence for thrombus in left gastrocnemius veins. Small left knee Baker's cyst. Electronically Signed   By: Markus Daft M.D.   On: 05/21/2017 18:03    (Echo, Carotid, EGD, Colonoscopy, ERCP)    Subjective:   Discharge Exam: Vitals:   06/10/17 0405 06/10/17 0919  BP: 130/67 130/62  Pulse: 64   Resp: 20   Temp: 98.6 F (37 C)   SpO2: 97%    Vitals:   06/09/17 1824 06/09/17 2054 06/10/17 0405 06/10/17 0919  BP:  132/65 130/67 130/62  Pulse:  65 64   Resp:  20 20   Temp: (!) 100.8 F (38.2 C) 98.9 F (37.2 C) 98.6 F (37 C)   TempSrc: Axillary Oral Oral   SpO2:  96% 97%   Weight:      Height:        General: Pt is alert, awake, not in acute distress Cardiovascular: RRR, S1/S2 +, no rubs, no gallops Respiratory: CTA bilaterally, no wheezing, no rhonchi Abdominal: Soft, NT, ND, bowel sounds + Extremities: no edema, no cyanosis    The results of significant diagnostics from this hospitalization (including imaging, microbiology, ancillary and laboratory) are listed below for reference.     Microbiology: Recent Results (from the past 240 hour(s))  Culture, blood (routine x 2)     Status: None (Preliminary result)   Collection Time: 06/09/17  7:00 PM  Result Value Ref Range Status   Specimen Description   Final    BLOOD RIGHT HAND Performed at [facility] [address], [place], [place]  [address]    Special Requests   Final    AEB BCLV Performed at [facility] [address] [place], [place] [address]    Culture   Final    NO GROWTH < 24 HOURS Performed at [facility] [address] [place], [place] [address]    Report Status PENDING  Incomplete  Culture, blood (routine x 2)     Status: None (Preliminary result)   Collection Time: 06/09/17  7:13 PM  Result Value Ref Range Status   Specimen Description   Final    BLOOD LEFT ARM Performed at [facility] [address], [place], [place] [address]    Special Requests   Final    BOTTLES DRAWN AEROBIC AND ANAEROBIC Blood Culture adequate volume Performed at [facility] [address], [place], [place] [address]    Culture   Final  NO GROWTH < 24 HOURS Performed at [facility] [address] [place], [place] [address]    Report Status PENDING  Incomplete     Labs: BNP (last 3 results) No results for input(s): BNP in the last 8760 hours. Basic Metabolic Panel: Recent Labs  Lab 06/07/17 1425 06/09/17 0536 06/10/17 0501  NA 137 141 141  K 4.6 4.3 3.7  CL 110 111 109  CO2 19* 20* 22  GLUCOSE 133* 166* 187*  BUN 62* 31* 31*  CREATININE 2.07* 1.64* 1.81*  CALCIUM 9.0 9.1 8.6*   Liver Function Tests: Recent Labs  Lab 06/09/17 0536  AST 14*  ALT 16  ALKPHOS 65  BILITOT 1.3*  PROT 6.4*  ALBUMIN 3.3*   No results for input(s): LIPASE, AMYLASE in the last 168 hours. No results for input(s): AMMONIA in the last 168 hours. CBC: Recent Labs  Lab 06/07/17 1425  06/08/17 0214 06/08/17 0753 06/08/17 0850 06/08/17 2100 06/09/17 0847 06/10/17 0501  WBC 8.2  --  6.9  --  6.8 8.6 9.0 10.4  NEUTROABS 4.7  --   --   --   --   --   --   --   HGB 11.6*   < > 10.8* 10.3* 11.1* 13.0 11.4* 10.9*  HCT 33.9*  --  31.1*  --  33.0* 39.1 33.8* 32.5*  MCV 90.6  --  90.9  --  91.2 93.3 92.1 91.5  PLT 217  --  178  --  171 207 186 167    < > = values in this interval not displayed.   Cardiac Enzymes: No results for input(s): CKTOTAL, CKMB, CKMBINDEX, TROPONINI in the last 168 hours. BNP: Invalid input(s): POCBNP CBG: Recent Labs  Lab 06/09/17 2036 06/10/17 0016 06/10/17 0404 06/10/17 0726 06/10/17 1214  GLUCAP 98 130* 164* 143* 369*   D-Dimer No results for input(s): DDIMER in the last 72 hours. Hgb A1c No results for input(s): HGBA1C in the last 72 hours. Lipid Profile No results for input(s): CHOL, HDL, LDLCALC, TRIG, CHOLHDL, LDLDIRECT in the last 72 hours. Thyroid function studies No results for input(s): TSH, T4TOTAL, T3FREE, THYROIDAB in the last 72 hours.  Invalid input(s): FREET3 Anemia work up No results for input(s): VITAMINB12, FOLATE, FERRITIN, TIBC, IRON, RETICCTPCT in the last 72 hours. Urinalysis    Component Value Date/Time   COLORURINE STRAW (A) 06/09/2017 1915   APPEARANCEUR CLEAR 06/09/2017 1915   LABSPEC 1.008 06/09/2017 1915   PHURINE 5.0 06/09/2017 1915   GLUCOSEU 50 (A) 06/09/2017 1915   GLUCOSEU 100 (A) 01/26/2017 1114   HGBUR NEGATIVE 06/09/2017 1915   HGBUR large 04/26/2009 1110   BILIRUBINUR NEGATIVE 06/09/2017 1915   BILIRUBINUR Negative 05/21/2017 1512   [place] 06/09/2017 1915   PROTEINUR 100 (A) 06/09/2017 1915   UROBILINOGEN negative (A) 05/21/2017 1512   UROBILINOGEN 0.2 01/26/2017 1114   NITRITE NEGATIVE 06/09/2017 1915   LEUKOCYTESUR NEGATIVE 06/09/2017 1915   Sepsis Labs Invalid input(s): PROCALCITONIN,  WBC,  LACTICIDVEN Microbiology Recent Results (from the past 240 hour(s))  Culture, blood (routine x 2)     Status: None (Preliminary result)   Collection Time: 06/09/17  7:00 PM  Result Value Ref Range Status   Specimen Description   Final    BLOOD RIGHT HAND Performed at [facility], [place] [address] [place], [place] [address]    Special Requests   Final    AEB BCLV Performed at [facility]  [address] [place], [place] [address]    Culture   Final    NO GROWTH < 24 HOURS Performed at [facility] [address] [place], [place] [address]    Report Status PENDING  Incomplete  Culture, blood (routine x 2)     Status: None (Preliminary result)   Collection Time: 06/09/17  7:13 PM  Result Value Ref Range Status   Specimen Description   Final    BLOOD LEFT ARM Performed at [facility] [address], [place], [place] [address]    Special Requests   Final    BOTTLES DRAWN AEROBIC AND ANAEROBIC Blood Culture adequate volume Performed at [facility] [address], [place], [place] [address]    Culture   Final    NO GROWTH < 24 HOURS Performed at [facility] [address], [place], [place] [address]    Report Status PENDING  Incomplete     Time coordinating discharge: Over 30 minutes  SIGNED:   Beverely Pace, MD  [facility] 06/10/2017, 4:18 PM Pager [phone]  If 7PM-7AM, please contact night-coverage [email or web address] Password [email or web address]

## 2017-06-10 NOTE — Telephone Encounter (Addendum)
Spoke with patient's daughter-in-law, Chanique Duca, regarding Coumadin for pt since she is likely being discharged today and received the ok from GI to resume Coumadin. Will resume lower dose of 2.5mg  daily except 5mg  on Mondays and Fridays - request for more conservative dosing from [facility] who also helps to fill patient's medication box. Hurshel Party, RN with [facility] [phone] and gave a verbal order to have INR checked on Monday April 1st. She will call clinic with INR for dosing instructions.

## 2017-06-10 NOTE — Progress Notes (Signed)
Pt to be discharged to home today. Pt and Pt's son given discharge teaching including medications and schedules. Understanding verbalized and discharge packet with Pt at time of discharge.

## 2017-06-10 NOTE — Consult Note (Addendum)
   [facility] CM Inpatient Consult   06/10/2017  Destiny [name] [birth year] [id number]    Patient screened for [place] Management program due to recent hospitalization and increased risk of unplanned readmission score.  Mrs. Destiny Infante' nurse advised Probation officer to also contact patient's son Destiny [name] to discuss [place] Management services.   Went to bedside to speak with patient and family about [facility] Care Management. Destiny [name] (son) had just left. Destiny [name] asked that Probation officer call Destiny [name] (son) as well. Telephone call to Destiny [name] at [phone] to discuss [place] Management services.   Destiny [name] is agreeable and [place] written consent was obtained.   Confirmed Primary Care MD is Destiny [name] with Vel[facility] Heckler at [facility]. Destiny [name] has caregiver assistance Monday- Friday for 4hrs/day. Destiny [name] is patient's caregiver.  Destiny [name] (son) denies any concerns with medications or with transportation.  Discussed [facility] Care Management follow up for DM. Mrs. Pe also has history of AFIB, CVA, dementia, CABG, CKD, HTN. Destiny [name] (son) request to be contacted post discharge at [phone]. Also states Destiny [name] (caregiver) can be secondary contact at [phone]. [facility] consent has these contacts listed.   Made Destiny [name] (son) and patient aware [facility] Care Management will not interfere or replace services provided by home health.   [place] [facility] is listed as doing their own transition of care calls.   Will make referral to [place] for follow up for DM.    Destiny Rolling, MSN-Ed, RN,BSN [facility] Liaison [phone]

## 2017-06-10 NOTE — Progress Notes (Signed)
ANTICOAGULATION CONSULT NOTE - Follow Up Consult  Pharmacy Consult for Warfarin Indication: DVT  No Known Allergies  Patient Measurements: Height: 5\' 6"  (167.6 cm) Weight: 159 lb (72.1 kg) IBW/kg (Calculated) : 59.3  Vital Signs: Temp: 98.6 F (37 C) (03/28 0405) Temp Source: Oral (03/28 0405) BP: 130/62 (03/28 0919) Pulse Rate: 64 (03/28 0405)  Labs: Recent Labs    06/07/17 1425  06/08/17 0214  06/08/17 0850 06/08/17 2100 06/09/17 0536 06/09/17 0847 06/10/17 0501  HGB 11.6*   < > 10.8*   < > 11.1* 13.0  --  11.4* 10.9*  HCT 33.9*  --  31.1*  --  33.0* 39.1  --  33.8* 32.5*  PLT 217  --  178  --  171 207  --  186 167  LABPROT 33.4*   < > 16.4*  --  15.5*  --  15.7*  --   --   INR 3.31   < > 1.33  --  1.24  --  1.26  --   --   CREATININE 2.07*  --   --   --   --   --  1.64*  --  1.81*   < > = values in this interval not displayed.    Estimated Creatinine Clearance: 23.5 mL/min (A) (by C-G formula based on SCr of 1.81 mg/dL (H)).   Medications:  Scheduled:  . amLODipine  5 mg Oral Daily  . atorvastatin  20 mg Oral Daily  . citalopram  20 mg Oral Daily  . ferrous sulfate  325 mg Oral Q breakfast  . insulin aspart  0-9 Units Subcutaneous Q4H  . insulin glargine  10 Units Subcutaneous QHS  . isosorbide mononitrate  30 mg Oral QHS  . isosorbide mononitrate  30 mg Oral Daily  . pantoprazole  40 mg Oral Daily  . Warfarin - Pharmacist Dosing Inpatient   Does not apply q1800   Assessment: Pharmacy is consulted to resumed warfarin in 84 yo female with PMH of lower extremity DVT. Pt presented to the ED with severe rectal bleeding and was given Vitamin K 10 mg IV x1 and Kcentra 1662 units on 3/25   Pt underwent colonoscopy on 3/26 and was found to not have  no recent or old blood in the colon. The bleeding has stopped. Pan-colonic diverticulosis noted, which is likely the source of her bleeding (in setting of supratherapeutic coumadin, INR was 5.9 one week PTA and 3.6  at admit). GI recommends restarting warfarin  Home med regimen was 2.5 mg daily on MWFSat and 5 mg on SunTuesThur.  Admission INR 3.6  Today, 06/10/2017: INR 1.4 CBC:  Hgb drifted down to 10.9, Plt 167k No further bleeding reported.   Goal of Therapy:  INR 2-3 Monitor platelets by anticoagulation protocol: Yes   Plan:  Warfarin 5 mg PO x 1 at 1800, if she remains inpatient. Daily PT/INR. Monitor closely for signs and symptoms of bleeding. At discharge, recommend lower dose of 2.5 mg daily except 5 mg on Mondays and Fridays.  Recheck INR by Monday, 4/1.   Gretta Arab PharmD, BCPS Pager [phone] 06/10/2017 11:01 AM

## 2017-06-10 NOTE — Care Management Note (Signed)
Case Management Note  Patient Details  Name: Destiny [name] MRN: [id number] Date of Birth: Jul 17, 1931  Subjective/Objective:  [facility] rep Santiago Glad aware of HHPT, & d/c home today.MD paged for face to face order. No further CM needs.                  Action/Plan:d/c home w/HHC.   Expected Discharge Date:                  Expected Discharge Plan:  [place]  In-House Referral:     Discharge planning Services  CM Consult  Post Acute Care Choice:  Home Health, Resumption of Svcs/PTA Provider(Active w/[facility] HHPT) Choice offered to:     DME Arranged:    DME Agency:     HH Arranged:  PT HH Agency:  [facility]  Status of Service:  Completed, signed off  If discussed at [facility] of Stay Meetings, dates discussed:    Additional Comments:  [name], RN 06/10/2017, 3:57 PM

## 2017-06-11 ENCOUNTER — Encounter: Payer: Self-pay | Admitting: Family Medicine

## 2017-06-11 ENCOUNTER — Telehealth: Payer: Self-pay | Admitting: Family Medicine

## 2017-06-11 DIAGNOSIS — Z794 Long term (current) use of insulin: Secondary | ICD-10-CM | POA: Diagnosis not present

## 2017-06-11 DIAGNOSIS — I480 Paroxysmal atrial fibrillation: Secondary | ICD-10-CM | POA: Diagnosis not present

## 2017-06-11 DIAGNOSIS — N184 Chronic kidney disease, stage 4 (severe): Secondary | ICD-10-CM | POA: Diagnosis not present

## 2017-06-11 DIAGNOSIS — E785 Hyperlipidemia, unspecified: Secondary | ICD-10-CM | POA: Diagnosis not present

## 2017-06-11 DIAGNOSIS — I2510 Atherosclerotic heart disease of native coronary artery without angina pectoris: Secondary | ICD-10-CM | POA: Diagnosis not present

## 2017-06-11 DIAGNOSIS — D509 Iron deficiency anemia, unspecified: Secondary | ICD-10-CM | POA: Diagnosis not present

## 2017-06-11 DIAGNOSIS — I129 Hypertensive chronic kidney disease with stage 1 through stage 4 chronic kidney disease, or unspecified chronic kidney disease: Secondary | ICD-10-CM | POA: Diagnosis not present

## 2017-06-11 DIAGNOSIS — E1122 Type 2 diabetes mellitus with diabetic chronic kidney disease: Secondary | ICD-10-CM | POA: Diagnosis not present

## 2017-06-11 DIAGNOSIS — Z951 Presence of aortocoronary bypass graft: Secondary | ICD-10-CM | POA: Diagnosis not present

## 2017-06-11 DIAGNOSIS — I69393 Ataxia following cerebral infarction: Secondary | ICD-10-CM | POA: Diagnosis not present

## 2017-06-11 DIAGNOSIS — I82402 Acute embolism and thrombosis of unspecified deep veins of left lower extremity: Secondary | ICD-10-CM | POA: Diagnosis not present

## 2017-06-11 DIAGNOSIS — K219 Gastro-esophageal reflux disease without esophagitis: Secondary | ICD-10-CM | POA: Diagnosis not present

## 2017-06-11 DIAGNOSIS — Z7901 Long term (current) use of anticoagulants: Secondary | ICD-10-CM | POA: Diagnosis not present

## 2017-06-11 DIAGNOSIS — Z7902 Long term (current) use of antithrombotics/antiplatelets: Secondary | ICD-10-CM | POA: Diagnosis not present

## 2017-06-11 DIAGNOSIS — J449 Chronic obstructive pulmonary disease, unspecified: Secondary | ICD-10-CM | POA: Diagnosis not present

## 2017-06-11 MED FILL — ISOSORBIDE MN ER 60 MG TAB: 60 | 90 days supply | Qty: 90 | Fill #0

## 2017-06-11 NOTE — Telephone Encounter (Unsigned)
Copied from [place] [id number]. Topic: Quick Communication - See Telephone Encounter >> Jun 11, 2017  3:22 PM Neva Seat wrote: [place] [phone]  Verbal Order for nursing to see pt in home for 4 weeks

## 2017-06-14 ENCOUNTER — Other Ambulatory Visit: Payer: Self-pay

## 2017-06-14 DIAGNOSIS — I480 Paroxysmal atrial fibrillation: Secondary | ICD-10-CM | POA: Diagnosis not present

## 2017-06-14 DIAGNOSIS — I2510 Atherosclerotic heart disease of native coronary artery without angina pectoris: Secondary | ICD-10-CM | POA: Diagnosis not present

## 2017-06-14 DIAGNOSIS — D509 Iron deficiency anemia, unspecified: Secondary | ICD-10-CM | POA: Diagnosis not present

## 2017-06-14 DIAGNOSIS — Z7901 Long term (current) use of anticoagulants: Secondary | ICD-10-CM | POA: Diagnosis not present

## 2017-06-14 DIAGNOSIS — J449 Chronic obstructive pulmonary disease, unspecified: Secondary | ICD-10-CM | POA: Diagnosis not present

## 2017-06-14 DIAGNOSIS — K219 Gastro-esophageal reflux disease without esophagitis: Secondary | ICD-10-CM | POA: Diagnosis not present

## 2017-06-14 DIAGNOSIS — N184 Chronic kidney disease, stage 4 (severe): Secondary | ICD-10-CM | POA: Diagnosis not present

## 2017-06-14 DIAGNOSIS — E785 Hyperlipidemia, unspecified: Secondary | ICD-10-CM | POA: Diagnosis not present

## 2017-06-14 DIAGNOSIS — E1122 Type 2 diabetes mellitus with diabetic chronic kidney disease: Secondary | ICD-10-CM | POA: Diagnosis not present

## 2017-06-14 DIAGNOSIS — I69393 Ataxia following cerebral infarction: Secondary | ICD-10-CM | POA: Diagnosis not present

## 2017-06-14 DIAGNOSIS — Z7902 Long term (current) use of antithrombotics/antiplatelets: Secondary | ICD-10-CM | POA: Diagnosis not present

## 2017-06-14 DIAGNOSIS — Z794 Long term (current) use of insulin: Secondary | ICD-10-CM | POA: Diagnosis not present

## 2017-06-14 DIAGNOSIS — Z951 Presence of aortocoronary bypass graft: Secondary | ICD-10-CM | POA: Diagnosis not present

## 2017-06-14 DIAGNOSIS — I82402 Acute embolism and thrombosis of unspecified deep veins of left lower extremity: Secondary | ICD-10-CM | POA: Diagnosis not present

## 2017-06-14 DIAGNOSIS — I129 Hypertensive chronic kidney disease with stage 1 through stage 4 chronic kidney disease, or unspecified chronic kidney disease: Secondary | ICD-10-CM | POA: Diagnosis not present

## 2017-06-14 LAB — CULTURE, BLOOD (ROUTINE X 2)
Culture: NO GROWTH
Culture: NO GROWTH
SPECIAL REQUESTS: ADEQUATE

## 2017-06-14 NOTE — Patient Outreach (Signed)
Initial telephone outreach: Placed call to contact who is son, Destiny [name].  Mr. Creason reports that his mother is doing well.  He provided me the contact information and requested I call the patient.  He also states that his wife is a Software engineer with [employer] and she manages all the patients medications.  Placed call to the patient who reports that she is doing well. Reports no recent bleeding. Reports no falls.  Offered home visit for 06/16/2017 and patient has accepted. Confirmed address.   To note: primary MD office doing Transition of care.  PLAN: home visit on 06/16/2017 Tomasa Rand, RN, BSN, CEN [facility] [employer] [phone]

## 2017-06-15 ENCOUNTER — Ambulatory Visit (INDEPENDENT_AMBULATORY_CARE_PROVIDER_SITE_OTHER): Payer: Medicare Other | Admitting: Cardiology

## 2017-06-15 ENCOUNTER — Telehealth: Payer: Self-pay | Admitting: Family Medicine

## 2017-06-15 DIAGNOSIS — I4891 Unspecified atrial fibrillation: Secondary | ICD-10-CM

## 2017-06-15 DIAGNOSIS — D509 Iron deficiency anemia, unspecified: Secondary | ICD-10-CM | POA: Diagnosis not present

## 2017-06-15 DIAGNOSIS — I129 Hypertensive chronic kidney disease with stage 1 through stage 4 chronic kidney disease, or unspecified chronic kidney disease: Secondary | ICD-10-CM | POA: Diagnosis not present

## 2017-06-15 DIAGNOSIS — I2510 Atherosclerotic heart disease of native coronary artery without angina pectoris: Secondary | ICD-10-CM | POA: Diagnosis not present

## 2017-06-15 DIAGNOSIS — Z5181 Encounter for therapeutic drug level monitoring: Secondary | ICD-10-CM

## 2017-06-15 DIAGNOSIS — Z7901 Long term (current) use of anticoagulants: Secondary | ICD-10-CM | POA: Diagnosis not present

## 2017-06-15 DIAGNOSIS — E785 Hyperlipidemia, unspecified: Secondary | ICD-10-CM | POA: Diagnosis not present

## 2017-06-15 DIAGNOSIS — K219 Gastro-esophageal reflux disease without esophagitis: Secondary | ICD-10-CM | POA: Diagnosis not present

## 2017-06-15 DIAGNOSIS — J449 Chronic obstructive pulmonary disease, unspecified: Secondary | ICD-10-CM | POA: Diagnosis not present

## 2017-06-15 DIAGNOSIS — I82401 Acute embolism and thrombosis of unspecified deep veins of right lower extremity: Secondary | ICD-10-CM

## 2017-06-15 DIAGNOSIS — Z7902 Long term (current) use of antithrombotics/antiplatelets: Secondary | ICD-10-CM | POA: Diagnosis not present

## 2017-06-15 DIAGNOSIS — I480 Paroxysmal atrial fibrillation: Secondary | ICD-10-CM | POA: Diagnosis not present

## 2017-06-15 DIAGNOSIS — Z951 Presence of aortocoronary bypass graft: Secondary | ICD-10-CM | POA: Diagnosis not present

## 2017-06-15 DIAGNOSIS — I82402 Acute embolism and thrombosis of unspecified deep veins of left lower extremity: Secondary | ICD-10-CM | POA: Diagnosis not present

## 2017-06-15 DIAGNOSIS — I69393 Ataxia following cerebral infarction: Secondary | ICD-10-CM | POA: Diagnosis not present

## 2017-06-15 DIAGNOSIS — E1122 Type 2 diabetes mellitus with diabetic chronic kidney disease: Secondary | ICD-10-CM | POA: Diagnosis not present

## 2017-06-15 DIAGNOSIS — N184 Chronic kidney disease, stage 4 (severe): Secondary | ICD-10-CM | POA: Diagnosis not present

## 2017-06-15 DIAGNOSIS — Z794 Long term (current) use of insulin: Secondary | ICD-10-CM | POA: Diagnosis not present

## 2017-06-15 LAB — POCT INR: INR: 2.8

## 2017-06-15 NOTE — Telephone Encounter (Signed)
Copied from [place]. Topic: Quick Communication - See Telephone Encounter >> Jun 15, 2017  1:49 PM Aurelio Brash B wrote: CRM for notification. See Telephone encounter for: 06/15/17. Cory from [facility] is requesting verbal nursing orders  1 time a week for 4 weeks  Tommi Rumps   [phone]

## 2017-06-16 ENCOUNTER — Other Ambulatory Visit: Payer: Self-pay

## 2017-06-16 NOTE — Patient Outreach (Signed)
[place] [facility]) Care Management  06/16/2017  Destiny [name] 12-02-1931 [id number]   Arrived for scheduled home visit.  Caregiver Jeannene Patella) with patient and states she did not know I was coming. I stated that I spoke with son and patient on 06/14/2017 and booked appointment.  Caregiver states that she manages all appointment and today was not good.   Offered to rescheduled for 06/21/2017 and caregiver agreed.  PLAN: Home visit on 06/21/2017 at 1pm. Provided my contact care to patient and caregiver (PAM)  Tomasa Rand, RN, BSN, CEN [place] Coordinator [phone]

## 2017-06-17 NOTE — Telephone Encounter (Signed)
Verbal orders given  

## 2017-06-21 ENCOUNTER — Ambulatory Visit (INDEPENDENT_AMBULATORY_CARE_PROVIDER_SITE_OTHER): Payer: Medicare Other | Admitting: Internal Medicine

## 2017-06-21 ENCOUNTER — Other Ambulatory Visit: Payer: Self-pay

## 2017-06-21 DIAGNOSIS — Z7902 Long term (current) use of antithrombotics/antiplatelets: Secondary | ICD-10-CM | POA: Diagnosis not present

## 2017-06-21 DIAGNOSIS — I2510 Atherosclerotic heart disease of native coronary artery without angina pectoris: Secondary | ICD-10-CM | POA: Diagnosis not present

## 2017-06-21 DIAGNOSIS — I129 Hypertensive chronic kidney disease with stage 1 through stage 4 chronic kidney disease, or unspecified chronic kidney disease: Secondary | ICD-10-CM | POA: Diagnosis not present

## 2017-06-21 DIAGNOSIS — I82401 Acute embolism and thrombosis of unspecified deep veins of right lower extremity: Secondary | ICD-10-CM | POA: Diagnosis not present

## 2017-06-21 DIAGNOSIS — E785 Hyperlipidemia, unspecified: Secondary | ICD-10-CM | POA: Diagnosis not present

## 2017-06-21 DIAGNOSIS — Z951 Presence of aortocoronary bypass graft: Secondary | ICD-10-CM | POA: Diagnosis not present

## 2017-06-21 DIAGNOSIS — K219 Gastro-esophageal reflux disease without esophagitis: Secondary | ICD-10-CM | POA: Diagnosis not present

## 2017-06-21 DIAGNOSIS — I4891 Unspecified atrial fibrillation: Secondary | ICD-10-CM

## 2017-06-21 DIAGNOSIS — I480 Paroxysmal atrial fibrillation: Secondary | ICD-10-CM | POA: Diagnosis not present

## 2017-06-21 DIAGNOSIS — I69393 Ataxia following cerebral infarction: Secondary | ICD-10-CM | POA: Diagnosis not present

## 2017-06-21 DIAGNOSIS — E1122 Type 2 diabetes mellitus with diabetic chronic kidney disease: Secondary | ICD-10-CM | POA: Diagnosis not present

## 2017-06-21 DIAGNOSIS — D509 Iron deficiency anemia, unspecified: Secondary | ICD-10-CM | POA: Diagnosis not present

## 2017-06-21 DIAGNOSIS — Z794 Long term (current) use of insulin: Secondary | ICD-10-CM | POA: Diagnosis not present

## 2017-06-21 DIAGNOSIS — Z7901 Long term (current) use of anticoagulants: Secondary | ICD-10-CM | POA: Diagnosis not present

## 2017-06-21 DIAGNOSIS — Z5181 Encounter for therapeutic drug level monitoring: Secondary | ICD-10-CM | POA: Diagnosis not present

## 2017-06-21 DIAGNOSIS — J449 Chronic obstructive pulmonary disease, unspecified: Secondary | ICD-10-CM | POA: Diagnosis not present

## 2017-06-21 DIAGNOSIS — N184 Chronic kidney disease, stage 4 (severe): Secondary | ICD-10-CM | POA: Diagnosis not present

## 2017-06-21 DIAGNOSIS — I82402 Acute embolism and thrombosis of unspecified deep veins of left lower extremity: Secondary | ICD-10-CM | POA: Diagnosis not present

## 2017-06-21 LAB — POCT INR: INR: 2.5

## 2017-06-21 NOTE — Patient Outreach (Signed)
[facility]) Care Management   06/21/2017  Destiny [name] February 21, 1932 [id number] 1pm arrived for home visit. Caregiver Pam was preparing to leave. Destiny [name] is an 84 y.o. female  Subjective: Patient reports that she feels well since she got home from the hospital. Reports monitors her blood sugar daily.  Caregiver, Pam assist patient daily from 9a-1pm . Caregiver assist with medications, meals, appointments, house keeping.  [place] daughter assist in the evenings with meals and medications.  Daughter in law is a Set designer and manages medications.  Patient is active with [facility] and PT.  Patient reports no bleeding since discharge. Patient reports that she is not following a special diet.   Objective:  Awake.  Ambulating without any assistive devices. ( slightly off balanced). Unable to remember cats name. Follows simple directions.   Today's Vitals   06/21/17 1316 06/21/17 1318  BP: 139/88   Pulse: 77   Resp: 18   SpO2: 97%   Weight: 156 lb (70.8 kg)   Height: 1.676 m (5\' 6" )   PainSc:  0-No pain   Review of Systems  Constitutional: Negative.   HENT: Negative.   Eyes:       Reports wears glasses as needed.  Respiratory: Negative.   Cardiovascular: Positive for leg swelling.  Gastrointestinal:       Denies any bleeding since discharged home from hospital  Genitourinary: Negative.   Musculoskeletal: Negative.   Skin: Negative.        Reports dry skin  Neurological: Negative.   Endo/Heme/Allergies: Negative.   Psychiatric/Behavioral:       Reports that she is forgetful    Physical Exam  Constitutional: She appears well-developed and well-nourished.  Cardiovascular: Normal rate and intact distal pulses.  Respiratory: Effort normal and breath sounds normal.  Lungs clear  GI: Soft. Bowel sounds are normal.  Musculoskeletal: Normal range of motion. She exhibits edema.  Trace edema above socks.  Neurological: She is  alert.  Able to verbalize correct Month, president . Unable to verbalize year and day of the week.   Skin: Skin is warm and dry.  Psychiatric: She has a normal mood and affect. Her behavior is normal. Judgment and thought content normal.    Encounter Medications:   Outpatient Encounter Medications as of 06/21/2017  Medication Sig Note  . albuterol (PROAIR HFA) 108 (90 Base) MCG/ACT inhaler Inhale 2 puffs into the lungs every 6 (six) hours as needed for wheezing or shortness of breath.   Marland Kitchen amLODipine (NORVASC) 5 MG tablet Take 1 tablet (5 mg total) by mouth daily.   Marland Kitchen atorvastatin (LIPITOR) 20 MG tablet Take 1 tablet (20 mg total) by mouth daily.   . Cholecalciferol (VITAMIN D) 1000 UNITS capsule Take 1,000 Units by mouth daily.    . citalopram (CELEXA) 20 MG tablet Take 1 tablet (20 mg total) by mouth daily.   . ferrous sulfate 325 (65 FE) MG tablet Take 325 mg daily with breakfast by mouth.   . insulin glargine (LANTUS) 100 UNIT/ML injection Inject 0.1 mLs (10 Units total) into the skin at bedtime. (Patient taking differently: Inject 25 Units into the skin daily. )   . isosorbide mononitrate (IMDUR) 30 MG 24 hr tablet Take 1 tablet (30 mg total) by mouth at bedtime. (Patient taking differently: Take 30 mg by mouth daily with supper. )   . Omega-3 Fatty Acids (FISH OIL) 1200 MG CAPS Take 1,200 mg by mouth daily.    Marland Kitchen  pantoprazole (PROTONIX) 40 MG tablet Take 40 mg by mouth daily.    Marland Kitchen thiamine (VITAMIN B-1) 100 MG tablet Take 1 tablet (100 mg total) by mouth daily. (Patient taking differently: Take 100 mg See admin instructions by mouth. Monday, Wednesday and Friday)   . warfarin (COUMADIN) 5 MG tablet Take 5 mg Monday and Friday.  Take 2.5 mg Tuesday, Thursday, Saturday, Sunday.  PT/INR to be monitored by the home health nurse and referred to primary care for the Coumadin dosage to be adjusted (Patient taking differently: 2.5 mg daily. Take 5 mg Monday and Friday.  Take 2.5 mg Tuesday, Thursday,  Saturday, Sunday.  PT/INR to be monitored by the home health nurse and referred to primary care for the Coumadin dosage to be adjusted)   . insulin lispro (HUMALOG KWIKPEN) 100 UNIT/ML KiwkPen Inject 0.05-0.1 mLs (5-10 Units total) into the skin 3 (three) times daily with meals. (Patient not taking: Reported on 06/21/2017)   . ipratropium (ATROVENT HFA) 17 MCG/ACT inhaler Inhale 2 puffs into the lungs every 6 (six) hours as needed for wheezing. (Patient not taking: Reported on 06/21/2017) 06/07/2017: Hasn't started.  Marland Kitchen ipratropium-albuterol (DUONEB) 0.5-2.5 (3) MG/3ML SOLN Take 3 mLs by nebulization every 6 (six) hours as needed. (Patient not taking: Reported on 06/21/2017)   . nitroGLYCERIN (NITROSTAT) 0.4 MG SL tablet Place 0.4 mg under the tongue every 5 (five) minutes as needed for chest pain.    No facility-administered encounter medications on file as of 06/21/2017.     Functional Status:   In your present state of health, do you have any difficulty performing the following activities: 06/21/2017 06/07/2017  Hearing? N N  Vision? N N  Difficulty concentrating or making decisions? [name]  Walking or climbing stairs? N N  Dressing or bathing? N N  Doing errands, shopping? [name]  Preparing Food and eating ? N -  Comment - -  Using the Toilet? N -  In the past six months, have you accidently leaked urine? N -  Do you have problems with loss of bowel control? N -  Managing your Medications? Y -  Comment daughter in law manages medications -  Managing your Finances? N -  Comment - -  Housekeeping or managing your Housekeeping? N -  Comment caregiver helps clean and also has a Chartered certified accountant once a week -  Some recent data might be hidden    Fall/Depression Screening:    Fall Risk  06/21/2017 04/14/2017 07/06/2016  Falls in the past year? No No Yes  Number falls in past yr: - - 1  Follow up - - Falls prevention discussed   PHQ 2/9 Scores 06/21/2017 07/06/2016 03/02/2016 11/26/2015 04/03/2014 10/27/2012  PHQ  - 2 Score 0 0 0 0 3 0  PHQ- 9 Score - - - - 12 -    Assessment:   (1) Reviewed [facility] program and transition of care.  Reviewed consent on file. Provided my contact card, [facility] calendar.  (2) recent GI bleed. [facility] is managing INR.  No active bleeding since hospital discharge. (3) DM: A1c of 8.4  ( 02/02/2017)  Patient declines interest in following DM diet. (4) medications managed by daughter in law Marvella Jenning ( pharmacist with [facility]). (5) jagged toenails. (6) no hospital follow up planned. (7) no recent eye exam. (8)  Caregiver with patient 4 hours per day.  Patient able to identify phone numbers to call family if needed.    Plan:  (1)  consent on file. (2)reviewed signs of bleeding with daughter in law and son via phone.  Encouraged prompt notification of symptoms to MD.  (3) Encouraged patient to continue to self monitor and take medications as prescribed.  Reviewed low carb diet with family via phone. Caregiver gone for the day. (4) Encouraged daughter in law to call me if needed. (5) reviewed concern for toenails via phone with family. Encouraged treatment. (6) Encouraged appointment to be made for hospital follow up. (7) Encouraged patient to get an eye screening exam for DM. (8) Reviewed plan of care with family and future phone calls to be with [facility] caregiver.   This note and barrier letter sent to MD.  [facility] CM Care Plan Problem One     Most Recent Value  Care Plan Problem One  Recent admission for GI bleed  Role Documenting the Problem One  Care Management [place] for Problem One  Active  [facility] Long Term Goal   Patient and or caregiver will report no evidence of bleeding in the next 31 days.   [facility] Long Term Goal Start Date  06/21/17  Interventions for Problem One Long Term Goal  Reviewed signs of bleeding with famiily via phone. Also reviewed with patient the importance of notifying family for black stools or abdominal pain.   [facility] CM Short  Term Goal #1   Patient will reports attending primary MD follow up post hospital discharge within 2 weeks.   [facility] CM Short Term Goal #1 Start Date  06/21/17  Interventions for Short Term Goal #1  Reviewed importance of primary MD follow up after being discharged home from hospital.   [facility] CM Short Term Goal #2   Pateint will reports increasing acitvity daily for the next 30 days.   [facility] CM Short Term Goal #2 Start Date  06/21/17  Interventions for Short Term Goal #2  Reviewed benefits of increased activity. reviewed fall precautions.       Tomasa Rand, RN, BSN, CEN [facility] [employer] [phone]

## 2017-06-23 ENCOUNTER — Encounter: Payer: Self-pay | Admitting: Family Medicine

## 2017-06-23 DIAGNOSIS — E785 Hyperlipidemia, unspecified: Secondary | ICD-10-CM | POA: Diagnosis not present

## 2017-06-23 DIAGNOSIS — I2510 Atherosclerotic heart disease of native coronary artery without angina pectoris: Secondary | ICD-10-CM | POA: Diagnosis not present

## 2017-06-23 DIAGNOSIS — N184 Chronic kidney disease, stage 4 (severe): Secondary | ICD-10-CM | POA: Diagnosis not present

## 2017-06-23 DIAGNOSIS — I129 Hypertensive chronic kidney disease with stage 1 through stage 4 chronic kidney disease, or unspecified chronic kidney disease: Secondary | ICD-10-CM | POA: Diagnosis not present

## 2017-06-23 DIAGNOSIS — I69393 Ataxia following cerebral infarction: Secondary | ICD-10-CM | POA: Diagnosis not present

## 2017-06-23 DIAGNOSIS — E1122 Type 2 diabetes mellitus with diabetic chronic kidney disease: Secondary | ICD-10-CM | POA: Diagnosis not present

## 2017-06-23 DIAGNOSIS — Z794 Long term (current) use of insulin: Secondary | ICD-10-CM | POA: Diagnosis not present

## 2017-06-23 DIAGNOSIS — Z7902 Long term (current) use of antithrombotics/antiplatelets: Secondary | ICD-10-CM | POA: Diagnosis not present

## 2017-06-23 DIAGNOSIS — D509 Iron deficiency anemia, unspecified: Secondary | ICD-10-CM | POA: Diagnosis not present

## 2017-06-23 DIAGNOSIS — K219 Gastro-esophageal reflux disease without esophagitis: Secondary | ICD-10-CM | POA: Diagnosis not present

## 2017-06-23 DIAGNOSIS — I480 Paroxysmal atrial fibrillation: Secondary | ICD-10-CM | POA: Diagnosis not present

## 2017-06-23 DIAGNOSIS — I82402 Acute embolism and thrombosis of unspecified deep veins of left lower extremity: Secondary | ICD-10-CM | POA: Diagnosis not present

## 2017-06-23 DIAGNOSIS — J449 Chronic obstructive pulmonary disease, unspecified: Secondary | ICD-10-CM | POA: Diagnosis not present

## 2017-06-23 DIAGNOSIS — Z7901 Long term (current) use of anticoagulants: Secondary | ICD-10-CM | POA: Diagnosis not present

## 2017-06-23 DIAGNOSIS — Z951 Presence of aortocoronary bypass graft: Secondary | ICD-10-CM | POA: Diagnosis not present

## 2017-06-23 MED FILL — WARFARIN SODIUM 5 MG TABLET: 5 | 17 days supply | Qty: 10 | Fill #0

## 2017-06-24 DIAGNOSIS — E785 Hyperlipidemia, unspecified: Secondary | ICD-10-CM | POA: Diagnosis not present

## 2017-06-24 DIAGNOSIS — N184 Chronic kidney disease, stage 4 (severe): Secondary | ICD-10-CM | POA: Diagnosis not present

## 2017-06-24 DIAGNOSIS — Z794 Long term (current) use of insulin: Secondary | ICD-10-CM | POA: Diagnosis not present

## 2017-06-24 DIAGNOSIS — I2510 Atherosclerotic heart disease of native coronary artery without angina pectoris: Secondary | ICD-10-CM | POA: Diagnosis not present

## 2017-06-24 DIAGNOSIS — I69393 Ataxia following cerebral infarction: Secondary | ICD-10-CM | POA: Diagnosis not present

## 2017-06-24 DIAGNOSIS — I129 Hypertensive chronic kidney disease with stage 1 through stage 4 chronic kidney disease, or unspecified chronic kidney disease: Secondary | ICD-10-CM | POA: Diagnosis not present

## 2017-06-24 DIAGNOSIS — J449 Chronic obstructive pulmonary disease, unspecified: Secondary | ICD-10-CM | POA: Diagnosis not present

## 2017-06-24 DIAGNOSIS — I480 Paroxysmal atrial fibrillation: Secondary | ICD-10-CM | POA: Diagnosis not present

## 2017-06-24 DIAGNOSIS — I82402 Acute embolism and thrombosis of unspecified deep veins of left lower extremity: Secondary | ICD-10-CM | POA: Diagnosis not present

## 2017-06-24 DIAGNOSIS — E1122 Type 2 diabetes mellitus with diabetic chronic kidney disease: Secondary | ICD-10-CM | POA: Diagnosis not present

## 2017-06-24 DIAGNOSIS — D509 Iron deficiency anemia, unspecified: Secondary | ICD-10-CM | POA: Diagnosis not present

## 2017-06-24 DIAGNOSIS — Z7901 Long term (current) use of anticoagulants: Secondary | ICD-10-CM | POA: Diagnosis not present

## 2017-06-24 DIAGNOSIS — Z7902 Long term (current) use of antithrombotics/antiplatelets: Secondary | ICD-10-CM | POA: Diagnosis not present

## 2017-06-24 DIAGNOSIS — Z951 Presence of aortocoronary bypass graft: Secondary | ICD-10-CM | POA: Diagnosis not present

## 2017-06-24 DIAGNOSIS — K219 Gastro-esophageal reflux disease without esophagitis: Secondary | ICD-10-CM | POA: Diagnosis not present

## 2017-06-24 NOTE — Telephone Encounter (Signed)
Spoke with patients son, she has appointment tomorrow with Dr. Nani Ravens

## 2017-06-25 ENCOUNTER — Encounter: Payer: Self-pay | Admitting: Family Medicine

## 2017-06-25 ENCOUNTER — Ambulatory Visit: Payer: Medicare Other | Admitting: Family Medicine

## 2017-06-25 VITALS — BP 140/80 | HR 82 | Temp 98.7°F | Ht 66.5 in | Wt 162.0 lb

## 2017-06-25 DIAGNOSIS — Z7901 Long term (current) use of anticoagulants: Secondary | ICD-10-CM | POA: Diagnosis not present

## 2017-06-25 DIAGNOSIS — J449 Chronic obstructive pulmonary disease, unspecified: Secondary | ICD-10-CM | POA: Diagnosis not present

## 2017-06-25 DIAGNOSIS — K219 Gastro-esophageal reflux disease without esophagitis: Secondary | ICD-10-CM | POA: Diagnosis not present

## 2017-06-25 DIAGNOSIS — K922 Gastrointestinal hemorrhage, unspecified: Secondary | ICD-10-CM

## 2017-06-25 DIAGNOSIS — Z951 Presence of aortocoronary bypass graft: Secondary | ICD-10-CM | POA: Diagnosis not present

## 2017-06-25 DIAGNOSIS — E785 Hyperlipidemia, unspecified: Secondary | ICD-10-CM | POA: Diagnosis not present

## 2017-06-25 DIAGNOSIS — I480 Paroxysmal atrial fibrillation: Secondary | ICD-10-CM | POA: Diagnosis not present

## 2017-06-25 DIAGNOSIS — Z7902 Long term (current) use of antithrombotics/antiplatelets: Secondary | ICD-10-CM | POA: Diagnosis not present

## 2017-06-25 DIAGNOSIS — I2510 Atherosclerotic heart disease of native coronary artery without angina pectoris: Secondary | ICD-10-CM | POA: Diagnosis not present

## 2017-06-25 DIAGNOSIS — I129 Hypertensive chronic kidney disease with stage 1 through stage 4 chronic kidney disease, or unspecified chronic kidney disease: Secondary | ICD-10-CM | POA: Diagnosis not present

## 2017-06-25 DIAGNOSIS — Z794 Long term (current) use of insulin: Secondary | ICD-10-CM | POA: Diagnosis not present

## 2017-06-25 DIAGNOSIS — I82402 Acute embolism and thrombosis of unspecified deep veins of left lower extremity: Secondary | ICD-10-CM | POA: Diagnosis not present

## 2017-06-25 DIAGNOSIS — I69393 Ataxia following cerebral infarction: Secondary | ICD-10-CM | POA: Diagnosis not present

## 2017-06-25 DIAGNOSIS — E1122 Type 2 diabetes mellitus with diabetic chronic kidney disease: Secondary | ICD-10-CM | POA: Diagnosis not present

## 2017-06-25 DIAGNOSIS — D509 Iron deficiency anemia, unspecified: Secondary | ICD-10-CM | POA: Diagnosis not present

## 2017-06-25 DIAGNOSIS — N184 Chronic kidney disease, stage 4 (severe): Secondary | ICD-10-CM | POA: Diagnosis not present

## 2017-06-25 LAB — CBC WITH DIFFERENTIAL/PLATELET
BASOS PCT: 1 % (ref 0.0–3.0)
Basophils Absolute: 0.1 10*3/uL (ref 0.0–0.1)
EOS PCT: 3.4 % (ref 0.0–5.0)
Eosinophils Absolute: 0.2 10*3/uL (ref 0.0–0.7)
HEMATOCRIT: 35.2 % — AB (ref 36.0–46.0)
Hemoglobin: 12.1 g/dL (ref 12.0–15.0)
LYMPHS ABS: 2 10*3/uL (ref 0.7–4.0)
Lymphocytes Relative: 28.3 % (ref 12.0–46.0)
MCHC: 34.4 g/dL (ref 30.0–36.0)
MCV: 91.5 fl (ref 78.0–100.0)
Monocytes Absolute: 0.5 10*3/uL (ref 0.1–1.0)
Monocytes Relative: 7.7 % (ref 3.0–12.0)
NEUTROS ABS: 4.1 10*3/uL (ref 1.4–7.7)
NEUTROS PCT: 59.6 % (ref 43.0–77.0)
PLATELETS: 226 10*3/uL (ref 150.0–400.0)
RBC: 3.85 Mil/uL — ABNORMAL LOW (ref 3.87–5.11)
RDW: 13.3 % (ref 11.5–15.5)
WBC: 6.9 10*3/uL (ref 4.0–10.5)

## 2017-06-25 LAB — BASIC METABOLIC PANEL
BUN: 37 mg/dL — ABNORMAL HIGH (ref 6–23)
CALCIUM: 9.2 mg/dL (ref 8.4–10.5)
CHLORIDE: 108 meq/L (ref 96–112)
CO2: 29 meq/L (ref 19–32)
Creatinine, Ser: 1.85 mg/dL — ABNORMAL HIGH (ref 0.40–1.20)
GFR: 27.59 mL/min — ABNORMAL LOW (ref >60.00)
GLUCOSE: 171 mg/dL — AB (ref 70–99)
Potassium: 4.7 mEq/L (ref 3.5–5.1)
SODIUM: 144 meq/L (ref 135–145)

## 2017-06-25 NOTE — Progress Notes (Signed)
Chief Complaint  Patient presents with  . Hospitalization Follow-up    HPI Destiny [name] is a 84 y.o. y.o. female who presents for a transition of care visit.  Pt was discharged from [facility] on 06/10/17.  Within 48 business hours of discharge our office contacted him via telephone to coordinate her care and needs.  She was here with caregiver.  The patient was admitted to the hospital for a lower GI bleed.  Her Coumadin was stopped while things improved.  She was discharged in stable condition.  Over the past week, she has been having dark/tarry stools.  She is taking oral iron though.  No reports of falls, lightheadedness, decreased energy, pallor, or other areas of easy bruising or bleeding.  Her last INR was 2.5 as she is back on Coumadin.  Her stool yesterday was normal, no bowel movement today.   Social History   Socioeconomic History  . Marital status: Widowed  Social History Narrative   Widowed - husband passed away in 05-26-2009   lives with daughter with epilepsy - mental retardation   Retired     Former Smoker      Alcohol use-no          Occupation: Retired       son - Levonne Carreras    Past Medical History:  Diagnosis Date  . Acute renal insufficiency 12/24/2013  . Amaurosis fugax   . Arthritis 12/06/2012  . Atherosclerosis   . CAD (coronary artery disease)    a. s/p CABG x 4 (LIMA->LAD, VG->Diag, VG->OM1->OM3);  b. 2010 Cath: 3/4 patent grafts (VG->diag occluded), 3vd, sev PVD ->Med Rx;  c. 12/2011 Lexi MV: sm area of mild mid and apical ant wall ischemia ->med rx.  . Carotid bruit   . Chronic kidney disease (CKD) stage G4/A1, severely decreased glomerular filtration rate (GFR) between 15-29 mL/min/1.73 square meter and albuminuria creatinine ratio less than 30 mg/g (HCC)    GFR 25 on 12/25/13  . CVA (cerebral infarction)   . Dementia    pt denies  . Depression 12/24/2013  . Diabetes mellitus type II    insulin dependent.   . Diabetic peripheral neuropathy ([place])   .  Diabetic retinopathy   . Diverticulosis   . Duodenal ulcer 2009 and 11/2011   caused GI bleeding  . Gallstones 2009   seen on CT scan  . GI bleed 05/27/11, 11/2011   found non-bleeding duodenal ulcers  . Hyperlipidemia   . Hypersomnolence 03/12/2013  . Hypertension   . Iron deficiency anemia secondary to blood loss (chronic)   . Melena 1015/2015   PEPTIC ULCER    REQUIRING TRANSFUSION  . Pedal edema 10/12/2016  . PVD (peripheral vascular disease) ([place])   . Rib fracture 04/21/2015  . Thiamine deficiency 08/13/2015  . UTI (urinary tract infection) 04/08/2014  . Weight loss, non-intentional 01/29/2014   Family History  Problem Relation Age of Onset  . Coronary artery disease Father   . Hypertension Father   . Stroke Father   . Cancer Mother        ?  . Diabetes Maternal Uncle   . Stroke Sister   . Colon cancer Neg Hx    Allergies as of 06/25/2017   No Known Allergies     Medication List        Accurate as of 06/25/17  4:44 PM. Always use your most recent med list.          albuterol 108 (90 Base)  MCG/ACT inhaler Commonly known as:  PROAIR HFA Inhale 2 puffs into the lungs every 6 (six) hours as needed for wheezing or shortness of breath.   amLODipine 5 MG tablet Commonly known as:  NORVASC Take 1 tablet (5 mg total) by mouth daily.   atorvastatin 20 MG tablet Commonly known as:  LIPITOR Take 1 tablet (20 mg total) by mouth daily.   citalopram 20 MG tablet Commonly known as:  CELEXA Take 1 tablet (20 mg total) by mouth daily.   ferrous sulfate 325 (65 FE) MG tablet Take 325 mg daily with breakfast by mouth.   Fish Oil 1200 MG Caps Take 1,200 mg by mouth daily.   insulin glargine 100 UNIT/ML injection Commonly known as:  LANTUS Inject 0.1 mLs (10 Units total) into the skin at bedtime.   insulin lispro 100 UNIT/ML KiwkPen Commonly known as:  HUMALOG KWIKPEN Inject 0.05-0.1 mLs (5-10 Units total) into the skin 3 (three) times daily with meals.   ipratropium 17  MCG/ACT inhaler Commonly known as:  ATROVENT HFA Inhale 2 puffs into the lungs every 6 (six) hours as needed for wheezing.   ipratropium-albuterol 0.5-2.5 (3) MG/3ML Soln Commonly known as:  DUONEB Take 3 mLs by nebulization every 6 (six) hours as needed.   isosorbide mononitrate 30 MG 24 hr tablet Commonly known as:  IMDUR Take 1 tablet (30 mg total) by mouth at bedtime.   nitroGLYCERIN 0.4 MG SL tablet Commonly known as:  NITROSTAT Place 0.4 mg under the tongue every 5 (five) minutes as needed for chest pain.   pantoprazole 40 MG tablet Commonly known as:  PROTONIX Take 40 mg by mouth daily.   thiamine 100 MG tablet Commonly known as:  VITAMIN B-1 Take 1 tablet (100 mg total) by mouth daily.   Vitamin D 1000 units capsule Take 1,000 Units by mouth daily.   warfarin 5 MG tablet Commonly known as:  COUMADIN Take as directed by the anticoagulation clinic. If you are unsure how to take this medication, talk to your nurse or doctor. Original instructions:  Take 5 mg Monday and Friday.  Take 2.5 mg Tuesday, Thursday, Saturday, Sunday.  PT/INR to be monitored by the home health nurse and referred to primary care for the Coumadin dosage to be adjusted       ROS:  Constitutional: No fevers or chills, no weight loss HEENT: No headaches, hearing loss, or runny nose, no sore throat Heart: No chest pain Lungs: No SOB, no cough Abd: As noted in HPI GU: No urinary complaints Neuro: No numbness, tingling or weakness Msk: No joint or muscle pain  Objective BP 140/80 (BP Location: Left Arm, Patient Position: Sitting, Cuff Size: Normal)   Pulse 82   Temp 98.7 F (37.1 C) (Oral)   Ht 5' 6.5" (1.689 m)   Wt 162 lb (73.5 kg)   SpO2 94%   BMI 25.76 kg/m  General Appearance:  awake, alert, oriented, in no acute distress and well developed, well nourished Skin:  there are no suspicious lesions or rashes of concern Head/face:  NCAT Eyes:  EOMI, PERRLA Ears:  canals and TMs  NI Nose/Sinuses:  negative Mouth/Throat:  Mucosa moist, no lesions; pharynx without erythema, edema or exudate. Neck:  neck- supple, no mass, non-tender and no jvd Lungs: Clear to auscultation.  No rales, rhonchi, or wheezing. Normal effort, no accessory muscle use. Heart:  Heart sounds are normal.  Regular rate and rhythm without murmur, gallop or rub. No bruits. Abdomen:  BS+,  soft, NT, ND, no masses or organomegaly; rectum of moderate tone with external hem noted, no rectal vault masses, stool noted, brown hue, no tarry stools or gross blood noted  Musculoskeletal:  No muscle group atrophy or asymmetry, gait normal Neurologic:  Alert and oriented x 3, gait normal., reflexes normal and symmetric, strength and  sensation grossly normal Psych exam: Nml mood and affect  Gastrointestinal hemorrhage, unspecified gastrointestinal hemorrhage type - Plan: Basic metabolic panel, CBC w/Diff, Fecal occult blood, imunochemical, CANCELED: Fecal Globin By Immunochemistry-([facility])  Anticoagulated on Coumadin  Discharge summary and medication list have been reviewed/reconciled.  Labs pending at the time of discharge have been reviewed or are still pending at the time of this visit.  Follow-up labs and appointments have been ordered and/or coordinated appropriately. Educational materials regarding the patient's admitting diagnosis provided.  TRANSITIONAL CARE MANAGEMENT CERTIFICATION:  I certify the following are true:   1. Communication with the patient/care giver was made within 2 business days of discharge.  2. Complexity of Medical decision making is moderate.   Cont Coumadin for now.  Neg hemaccult today, will recheck for thoroughness. Will also ck CBC, if WNL, will be reassured that this is likely 2/2 PO Fe intake.  F/u as originally scheduled w cpe. The patient and her caregiver voiced understanding and agreement to the plan.  [place], DO 06/25/17 4:44 PM

## 2017-06-25 NOTE — Progress Notes (Signed)
Pre visit review using our clinic review tool, if applicable. No additional management support is needed unless otherwise documented below in the visit note. 

## 2017-06-25 NOTE — Patient Instructions (Signed)
Your stool did not test for blood today. Your oral iron can cause your stool to be dark and tarry.  For now, continue on Coumadin.   Let us know if you need anything.

## 2017-06-28 ENCOUNTER — Other Ambulatory Visit: Payer: Self-pay

## 2017-06-28 ENCOUNTER — Ambulatory Visit (INDEPENDENT_AMBULATORY_CARE_PROVIDER_SITE_OTHER): Payer: Medicare Other | Admitting: Internal Medicine

## 2017-06-28 ENCOUNTER — Ambulatory Visit: Payer: Self-pay

## 2017-06-28 DIAGNOSIS — Z951 Presence of aortocoronary bypass graft: Secondary | ICD-10-CM | POA: Diagnosis not present

## 2017-06-28 DIAGNOSIS — Z5181 Encounter for therapeutic drug level monitoring: Secondary | ICD-10-CM | POA: Diagnosis not present

## 2017-06-28 DIAGNOSIS — I480 Paroxysmal atrial fibrillation: Secondary | ICD-10-CM | POA: Diagnosis not present

## 2017-06-28 DIAGNOSIS — I129 Hypertensive chronic kidney disease with stage 1 through stage 4 chronic kidney disease, or unspecified chronic kidney disease: Secondary | ICD-10-CM | POA: Diagnosis not present

## 2017-06-28 DIAGNOSIS — I4891 Unspecified atrial fibrillation: Secondary | ICD-10-CM | POA: Diagnosis not present

## 2017-06-28 DIAGNOSIS — D509 Iron deficiency anemia, unspecified: Secondary | ICD-10-CM | POA: Diagnosis not present

## 2017-06-28 DIAGNOSIS — I82401 Acute embolism and thrombosis of unspecified deep veins of right lower extremity: Secondary | ICD-10-CM | POA: Diagnosis not present

## 2017-06-28 DIAGNOSIS — I82402 Acute embolism and thrombosis of unspecified deep veins of left lower extremity: Secondary | ICD-10-CM | POA: Diagnosis not present

## 2017-06-28 DIAGNOSIS — E1122 Type 2 diabetes mellitus with diabetic chronic kidney disease: Secondary | ICD-10-CM | POA: Diagnosis not present

## 2017-06-28 DIAGNOSIS — I2510 Atherosclerotic heart disease of native coronary artery without angina pectoris: Secondary | ICD-10-CM | POA: Diagnosis not present

## 2017-06-28 DIAGNOSIS — Z7901 Long term (current) use of anticoagulants: Secondary | ICD-10-CM | POA: Diagnosis not present

## 2017-06-28 DIAGNOSIS — E785 Hyperlipidemia, unspecified: Secondary | ICD-10-CM | POA: Diagnosis not present

## 2017-06-28 DIAGNOSIS — Z794 Long term (current) use of insulin: Secondary | ICD-10-CM | POA: Diagnosis not present

## 2017-06-28 DIAGNOSIS — K219 Gastro-esophageal reflux disease without esophagitis: Secondary | ICD-10-CM | POA: Diagnosis not present

## 2017-06-28 DIAGNOSIS — J449 Chronic obstructive pulmonary disease, unspecified: Secondary | ICD-10-CM | POA: Diagnosis not present

## 2017-06-28 DIAGNOSIS — I69393 Ataxia following cerebral infarction: Secondary | ICD-10-CM | POA: Diagnosis not present

## 2017-06-28 DIAGNOSIS — Z7902 Long term (current) use of antithrombotics/antiplatelets: Secondary | ICD-10-CM | POA: Diagnosis not present

## 2017-06-28 DIAGNOSIS — N184 Chronic kidney disease, stage 4 (severe): Secondary | ICD-10-CM | POA: Diagnosis not present

## 2017-06-28 LAB — POCT INR: INR: 2.6

## 2017-06-28 NOTE — Patient Instructions (Signed)
Description   Spoke with Apolonio Schneiders, [facility] nurse, in home with patient and advised to continue taking 2.5mg  daily except 5mg  on Mondays.  Recheck INR on Monday. Call [phone] if any questions.

## 2017-06-29 ENCOUNTER — Other Ambulatory Visit: Payer: Self-pay

## 2017-06-29 DIAGNOSIS — Z7902 Long term (current) use of antithrombotics/antiplatelets: Secondary | ICD-10-CM | POA: Diagnosis not present

## 2017-06-29 DIAGNOSIS — E785 Hyperlipidemia, unspecified: Secondary | ICD-10-CM | POA: Diagnosis not present

## 2017-06-29 DIAGNOSIS — E1122 Type 2 diabetes mellitus with diabetic chronic kidney disease: Secondary | ICD-10-CM | POA: Diagnosis not present

## 2017-06-29 DIAGNOSIS — I82402 Acute embolism and thrombosis of unspecified deep veins of left lower extremity: Secondary | ICD-10-CM | POA: Diagnosis not present

## 2017-06-29 DIAGNOSIS — Z951 Presence of aortocoronary bypass graft: Secondary | ICD-10-CM | POA: Diagnosis not present

## 2017-06-29 DIAGNOSIS — Z7901 Long term (current) use of anticoagulants: Secondary | ICD-10-CM | POA: Diagnosis not present

## 2017-06-29 DIAGNOSIS — I129 Hypertensive chronic kidney disease with stage 1 through stage 4 chronic kidney disease, or unspecified chronic kidney disease: Secondary | ICD-10-CM | POA: Diagnosis not present

## 2017-06-29 DIAGNOSIS — N184 Chronic kidney disease, stage 4 (severe): Secondary | ICD-10-CM | POA: Diagnosis not present

## 2017-06-29 DIAGNOSIS — Z794 Long term (current) use of insulin: Secondary | ICD-10-CM | POA: Diagnosis not present

## 2017-06-29 DIAGNOSIS — K219 Gastro-esophageal reflux disease without esophagitis: Secondary | ICD-10-CM | POA: Diagnosis not present

## 2017-06-29 DIAGNOSIS — I69393 Ataxia following cerebral infarction: Secondary | ICD-10-CM | POA: Diagnosis not present

## 2017-06-29 DIAGNOSIS — I480 Paroxysmal atrial fibrillation: Secondary | ICD-10-CM | POA: Diagnosis not present

## 2017-06-29 DIAGNOSIS — I2510 Atherosclerotic heart disease of native coronary artery without angina pectoris: Secondary | ICD-10-CM | POA: Diagnosis not present

## 2017-06-29 DIAGNOSIS — J449 Chronic obstructive pulmonary disease, unspecified: Secondary | ICD-10-CM | POA: Diagnosis not present

## 2017-06-29 DIAGNOSIS — D509 Iron deficiency anemia, unspecified: Secondary | ICD-10-CM | POA: Diagnosis not present

## 2017-06-29 MED ORDER — WARFARIN SODIUM 5 MG PO TABS: ORAL_TABLET | ORAL | 1 refills | Status: DC

## 2017-06-29 NOTE — Patient Outreach (Signed)
Transition of care: Placed call to patient for transition of care, spoke with Pam ( caregiver)  Pam reports that patient is doing well. Reports no recent bleeding. Reports no new concerns today.  PLAN: will follow up in 1 week. Reviewed signs of bleeding with caregiver who voiced understanding.  Tomasa Rand, RN, BSN, CEN [facility] [employer] [phone]

## 2017-07-01 DIAGNOSIS — E1122 Type 2 diabetes mellitus with diabetic chronic kidney disease: Secondary | ICD-10-CM | POA: Diagnosis not present

## 2017-07-01 DIAGNOSIS — I2510 Atherosclerotic heart disease of native coronary artery without angina pectoris: Secondary | ICD-10-CM | POA: Diagnosis not present

## 2017-07-01 DIAGNOSIS — J449 Chronic obstructive pulmonary disease, unspecified: Secondary | ICD-10-CM | POA: Diagnosis not present

## 2017-07-01 DIAGNOSIS — I480 Paroxysmal atrial fibrillation: Secondary | ICD-10-CM | POA: Diagnosis not present

## 2017-07-01 DIAGNOSIS — N184 Chronic kidney disease, stage 4 (severe): Secondary | ICD-10-CM | POA: Diagnosis not present

## 2017-07-01 DIAGNOSIS — Z7902 Long term (current) use of antithrombotics/antiplatelets: Secondary | ICD-10-CM | POA: Diagnosis not present

## 2017-07-01 DIAGNOSIS — D509 Iron deficiency anemia, unspecified: Secondary | ICD-10-CM | POA: Diagnosis not present

## 2017-07-01 DIAGNOSIS — K219 Gastro-esophageal reflux disease without esophagitis: Secondary | ICD-10-CM | POA: Diagnosis not present

## 2017-07-01 DIAGNOSIS — Z7901 Long term (current) use of anticoagulants: Secondary | ICD-10-CM | POA: Diagnosis not present

## 2017-07-01 DIAGNOSIS — Z794 Long term (current) use of insulin: Secondary | ICD-10-CM | POA: Diagnosis not present

## 2017-07-01 DIAGNOSIS — I82402 Acute embolism and thrombosis of unspecified deep veins of left lower extremity: Secondary | ICD-10-CM | POA: Diagnosis not present

## 2017-07-01 DIAGNOSIS — I69393 Ataxia following cerebral infarction: Secondary | ICD-10-CM | POA: Diagnosis not present

## 2017-07-01 DIAGNOSIS — E785 Hyperlipidemia, unspecified: Secondary | ICD-10-CM | POA: Diagnosis not present

## 2017-07-01 DIAGNOSIS — I129 Hypertensive chronic kidney disease with stage 1 through stage 4 chronic kidney disease, or unspecified chronic kidney disease: Secondary | ICD-10-CM | POA: Diagnosis not present

## 2017-07-01 DIAGNOSIS — Z951 Presence of aortocoronary bypass graft: Secondary | ICD-10-CM | POA: Diagnosis not present

## 2017-07-01 MED FILL — WARFARIN SODIUM 5 MG TABLET: 5 | 30 days supply | Qty: 30 | Fill #0

## 2017-07-05 ENCOUNTER — Ambulatory Visit (INDEPENDENT_AMBULATORY_CARE_PROVIDER_SITE_OTHER): Payer: Medicare Other | Admitting: Cardiovascular Disease

## 2017-07-05 ENCOUNTER — Other Ambulatory Visit: Payer: Self-pay | Admitting: Family Medicine

## 2017-07-05 DIAGNOSIS — I4891 Unspecified atrial fibrillation: Secondary | ICD-10-CM

## 2017-07-05 DIAGNOSIS — N184 Chronic kidney disease, stage 4 (severe): Secondary | ICD-10-CM | POA: Diagnosis not present

## 2017-07-05 DIAGNOSIS — Z5181 Encounter for therapeutic drug level monitoring: Secondary | ICD-10-CM

## 2017-07-05 DIAGNOSIS — E785 Hyperlipidemia, unspecified: Secondary | ICD-10-CM | POA: Diagnosis not present

## 2017-07-05 DIAGNOSIS — Z7901 Long term (current) use of anticoagulants: Secondary | ICD-10-CM | POA: Diagnosis not present

## 2017-07-05 DIAGNOSIS — I82402 Acute embolism and thrombosis of unspecified deep veins of left lower extremity: Secondary | ICD-10-CM | POA: Diagnosis not present

## 2017-07-05 DIAGNOSIS — K219 Gastro-esophageal reflux disease without esophagitis: Secondary | ICD-10-CM | POA: Diagnosis not present

## 2017-07-05 DIAGNOSIS — E1122 Type 2 diabetes mellitus with diabetic chronic kidney disease: Secondary | ICD-10-CM | POA: Diagnosis not present

## 2017-07-05 DIAGNOSIS — Z7902 Long term (current) use of antithrombotics/antiplatelets: Secondary | ICD-10-CM | POA: Diagnosis not present

## 2017-07-05 DIAGNOSIS — Z951 Presence of aortocoronary bypass graft: Secondary | ICD-10-CM | POA: Diagnosis not present

## 2017-07-05 DIAGNOSIS — I2510 Atherosclerotic heart disease of native coronary artery without angina pectoris: Secondary | ICD-10-CM | POA: Diagnosis not present

## 2017-07-05 DIAGNOSIS — I480 Paroxysmal atrial fibrillation: Secondary | ICD-10-CM | POA: Diagnosis not present

## 2017-07-05 DIAGNOSIS — I129 Hypertensive chronic kidney disease with stage 1 through stage 4 chronic kidney disease, or unspecified chronic kidney disease: Secondary | ICD-10-CM | POA: Diagnosis not present

## 2017-07-05 DIAGNOSIS — D509 Iron deficiency anemia, unspecified: Secondary | ICD-10-CM | POA: Diagnosis not present

## 2017-07-05 DIAGNOSIS — J449 Chronic obstructive pulmonary disease, unspecified: Secondary | ICD-10-CM | POA: Diagnosis not present

## 2017-07-05 DIAGNOSIS — I69393 Ataxia following cerebral infarction: Secondary | ICD-10-CM | POA: Diagnosis not present

## 2017-07-05 DIAGNOSIS — Z794 Long term (current) use of insulin: Secondary | ICD-10-CM | POA: Diagnosis not present

## 2017-07-05 LAB — POCT INR: INR: 2.6

## 2017-07-05 MED FILL — PANTOPRAZOLE SOD DR 40 MG T: 40 | 90 days supply | Qty: 90 | Fill #0

## 2017-07-05 MED FILL — LANTUS SOLOSTAR 100 UNITS/M: 100 | 30 days supply | Qty: 12 | Fill #2

## 2017-07-05 NOTE — Patient Instructions (Signed)
Description   Spoke with Apolonio Schneiders, [facility] nurse, in home with patient and advised to continue taking 2.5mg  daily except 5mg  on Mondays.  Recheck INR in 2 weeks in clinic since pt is being discharged from [facility] today. Please call daughter Lenore Manner with dose instructions. Pam, caregiver will bring patient to appointments [phone]

## 2017-07-06 ENCOUNTER — Telehealth: Payer: Self-pay | Admitting: *Deleted

## 2017-07-06 DIAGNOSIS — E785 Hyperlipidemia, unspecified: Secondary | ICD-10-CM | POA: Diagnosis not present

## 2017-07-06 DIAGNOSIS — Z7902 Long term (current) use of antithrombotics/antiplatelets: Secondary | ICD-10-CM | POA: Diagnosis not present

## 2017-07-06 DIAGNOSIS — I82402 Acute embolism and thrombosis of unspecified deep veins of left lower extremity: Secondary | ICD-10-CM | POA: Diagnosis not present

## 2017-07-06 DIAGNOSIS — J449 Chronic obstructive pulmonary disease, unspecified: Secondary | ICD-10-CM | POA: Diagnosis not present

## 2017-07-06 DIAGNOSIS — I69393 Ataxia following cerebral infarction: Secondary | ICD-10-CM | POA: Diagnosis not present

## 2017-07-06 DIAGNOSIS — Z951 Presence of aortocoronary bypass graft: Secondary | ICD-10-CM | POA: Diagnosis not present

## 2017-07-06 DIAGNOSIS — D509 Iron deficiency anemia, unspecified: Secondary | ICD-10-CM | POA: Diagnosis not present

## 2017-07-06 DIAGNOSIS — N184 Chronic kidney disease, stage 4 (severe): Secondary | ICD-10-CM | POA: Diagnosis not present

## 2017-07-06 DIAGNOSIS — I480 Paroxysmal atrial fibrillation: Secondary | ICD-10-CM | POA: Diagnosis not present

## 2017-07-06 DIAGNOSIS — E1122 Type 2 diabetes mellitus with diabetic chronic kidney disease: Secondary | ICD-10-CM | POA: Diagnosis not present

## 2017-07-06 DIAGNOSIS — K219 Gastro-esophageal reflux disease without esophagitis: Secondary | ICD-10-CM | POA: Diagnosis not present

## 2017-07-06 DIAGNOSIS — Z794 Long term (current) use of insulin: Secondary | ICD-10-CM | POA: Diagnosis not present

## 2017-07-06 DIAGNOSIS — I129 Hypertensive chronic kidney disease with stage 1 through stage 4 chronic kidney disease, or unspecified chronic kidney disease: Secondary | ICD-10-CM | POA: Diagnosis not present

## 2017-07-06 DIAGNOSIS — Z7901 Long term (current) use of anticoagulants: Secondary | ICD-10-CM | POA: Diagnosis not present

## 2017-07-06 DIAGNOSIS — I2510 Atherosclerotic heart disease of native coronary artery without angina pectoris: Secondary | ICD-10-CM | POA: Diagnosis not present

## 2017-07-06 NOTE — Telephone Encounter (Signed)
Received Home Health Certification and Plan of Care; forwarded to provider/SLS 04/23

## 2017-07-06 NOTE — Progress Notes (Signed)
Subjective:   Destiny [name] is a 84 y.o. female who presents for Medicare Annual (Subsequent) preventive examination.  Review of Systems: No ROS.  Medicare Wellness Visit. Additional risk factors are reflected in the social history.  Cardiac Risk Factors include: advanced age (>8men, >37 women);diabetes mellitus;hypertension Sleep patterns: no issues.  Home Safety/Smoke Alarms: Feels safe in home. Smoke alarms in place.  Living environment; residence and Firearm Safety: Lives in 1 story home. Caregiver Mon-Friday 9-2.   Female:       Mammo-no longer doing per caregiver       Dexa scan- no longer doing per caregiver       CCS-utd     Objective:     Vitals: BP 136/68 (BP Location: Left Arm, Patient Position: Sitting, Cuff Size: Normal)   Pulse 60   Ht 5\' 7"  (1.702 m)   Wt 161 lb (73 kg)   SpO2 96%   BMI 25.22 kg/m   Body mass index is 25.22 kg/m.  Advanced Directives 07/09/2017 06/21/2017 06/09/2017 06/07/2017 05/22/2017 05/21/2017 02/01/2017  Does Patient Have a Medical Advance Directive? Yes Yes Yes Yes Yes Yes Yes  Type of Paramedic of [place];Living will Healthcare Power of [place] of [place] of [place]  Does patient want to make changes to medical advance directive? No - Patient declined No - Patient declined - No - Patient declined No - Patient declined No - Patient declined No - Patient declined  Copy of [place] in Chart? Yes No - copy requested No - copy requested No - copy requested No - copy requested - No - copy requested  Would patient like information on creating a medical advance directive? - - - - - - -  Pre-existing out of facility DNR order (yellow form or pink MOST form) - - - - - - -    Tobacco Social History   Tobacco Use  Smoking Status Former Smoker  . Types: Cigarettes  . Last attempt to quit: 12/31/2000  .  Years since quitting: 16.5  Smokeless Tobacco Never Used     Counseling given: Not Answered   Clinical Intake:     Pain : No/denies pain                 Past Medical History:  Diagnosis Date  . Acute renal insufficiency 12/24/2013  . Amaurosis fugax   . Arthritis 12/06/2012  . Atherosclerosis   . CAD (coronary artery disease)    a. s/p CABG x 4 (LIMA->LAD, VG->Diag, VG->OM1->OM3);  b. 2010 Cath: 3/4 patent grafts (VG->diag occluded), 3vd, sev PVD ->Med Rx;  c. 12/2011 Lexi MV: sm area of mild mid and apical ant wall ischemia ->med rx.  . Carotid bruit   . Chronic kidney disease (CKD) stage G4/A1, severely decreased glomerular filtration rate (GFR) between 15-29 mL/min/1.73 square meter and albuminuria creatinine ratio less than 30 mg/g (HCC)    GFR 25 on 12/25/13  . CVA (cerebral infarction)   . Dementia    pt denies  . Depression 12/24/2013  . Diabetes mellitus type II    insulin dependent.   . Diabetic peripheral neuropathy ([place])   . Diabetic retinopathy   . Diverticulosis   . Duodenal ulcer 2009 and 11/2011   caused GI bleeding  . Gallstones 2009   seen on CT scan  . GI bleed 04/2011, 11/2011   found non-bleeding duodenal ulcers  .  Hyperlipidemia   . Hypersomnolence 03/12/2013  . Hypertension   . Iron deficiency anemia secondary to blood loss (chronic)   . Melena 1015/2015   PEPTIC ULCER    REQUIRING TRANSFUSION  . Pedal edema 10/12/2016  . PVD (peripheral vascular disease) ([place])   . Rib fracture 04/21/2015  . Thiamine deficiency 08/13/2015  . UTI (urinary tract infection) 04/08/2014  . Weight loss, non-intentional 01/29/2014   Past Surgical History:  Procedure Laterality Date  . ABDOMINAL HYSTERECTOMY    . COLONOSCOPY  05/20/2011   Procedure: COLONOSCOPY;  Surgeon: Scarlette Shorts, MD;  Location: [facility];  Service: Endoscopy;  Laterality: N/A;  . COLONOSCOPY WITH PROPOFOL N/A 06/09/2017   Procedure: COLONOSCOPY WITH PROPOFOL;  Surgeon: Milus Banister, MD;   Location: [facility] ENDOSCOPY;  Service: Endoscopy;  Laterality: N/A;  . CORONARY ARTERY BYPASS GRAFT  1999   x 4  . ENTEROSCOPY N/A 12/29/2013   Procedure: ENTEROSCOPY;  Surgeon: Inda Castle, MD;  Location: [facility];  Service: Endoscopy;  Laterality: N/A;  . ESOPHAGOGASTRODUODENOSCOPY  05/16/2011   Procedure: ESOPHAGOGASTRODUODENOSCOPY (EGD);  Surgeon: Gatha Mayer, MD;  Location[facility];  Service: Endoscopy;  Laterality: N/A;  . ESOPHAGOGASTRODUODENOSCOPY  12/08/2011   Procedure: ESOPHAGOGASTRODUODENOSCOPY (EGD);  Surgeon: Ladene Artist, MD,FACG;  [facility] END[facility]COPY;  Service: Endoscopy;  Laterality: N/A;  . ESOPHAGOGASTRODUODENOSCOPY (EGD) WITH PROPOFOL N/A 06/08/2017   Procedure: ESOPHAGOGASTRODUODENOSCOPY (EGD) WITH PROPOFOL;  Surgeon: Milus Banister, MD;  Location: [facility] ENDOSCOPY;  Service: Endoscopy;  Laterality: N/A;  . UPPER GASTROINTESTINAL ENDOSCOPY  02/22/2008   w/biopsy, duedenal ulcer, antrum erosions   Family History  Problem Relation Age of Onset  . Coronary artery disease Father   . Hypertension Father   . Stroke Father   . Cancer Mother        ?  . Diabetes Maternal Uncle   . Stroke Sister   . Colon cancer Neg Hx    Social History   Socioeconomic History  . Marital status: Widowed    Spouse name: Not on file  . Number of children: 4  . Years of education: Not on file  . Highest education level: Not on file  Occupational History  . Occupation: retired    Fish farm manager: RETIRED  Social Needs  . Financial resource strain: Not on file  . Food insecurity:    Worry: Not on file    Inability: Not on file  . Transportation needs:    Medical: Not on file    Non-medical: Not on file  Tobacco Use  . Smoking status: Former Smoker    Types: Cigarettes    Last attempt to quit: 12/31/2000    Years since quitting: 16.5  . Smokeless tobacco: Never Used  Substance and Sexual Activity  . Alcohol use: No  . Drug use: No  . Sexual activity: Not on file    Lifestyle  . Physical activity:    Days per week: Not on file    Minutes per session: Not on file  . Stress: Not on file  Relationships  . Social connections:    Talks on phone: Not on file    Gets together: Not on file    Attends religious service: Not on file    Active member of club or organization: Not on file    Attends meetings of clubs or organizations: Not on file    Relationship status: Not on file  Other Topics Concern  . Not on file  Social History Narrative  Widowed - husband passed away in 2009/05/29   lives with daughter with epilepsy - mental retardation   Retired     Former Smoker      Alcohol use-no          Occupation: Retired       son - Selah Klang     Outpatient Encounter Medications as of 07/09/2017  Medication Sig  . albuterol (PROAIR HFA) 108 (90 Base) MCG/ACT inhaler Inhale 2 puffs into the lungs every 6 (six) hours as needed for wheezing or shortness of breath.  Marland Kitchen amLODipine (NORVASC) 5 MG tablet Take 1 tablet (5 mg total) by mouth daily.  Marland Kitchen atorvastatin (LIPITOR) 20 MG tablet Take 1 tablet (20 mg total) by mouth daily.  . Cholecalciferol (VITAMIN D) 1000 UNITS capsule Take 1,000 Units by mouth daily.   . citalopram (CELEXA) 20 MG tablet Take 1 tablet (20 mg total) by mouth daily.  . ferrous sulfate 325 (65 FE) MG tablet Take 325 mg daily with breakfast by mouth.  . insulin glargine (LANTUS) 100 UNIT/ML injection Inject 0.1 mLs (10 Units total) into the skin at bedtime. (Patient taking differently: Inject 25 Units into the skin daily. )  . insulin lispro (HUMALOG KWIKPEN) 100 UNIT/ML KiwkPen Inject 0.05-0.1 mLs (5-10 Units total) into the skin 3 (three) times daily with meals.  Marland Kitchen ipratropium (ATROVENT HFA) 17 MCG/ACT inhaler Inhale 2 puffs into the lungs every 6 (six) hours as needed for wheezing.  Marland Kitchen ipratropium-albuterol (DUONEB) 0.5-2.5 (3) MG/3ML SOLN Take 3 mLs by nebulization every 6 (six) hours as needed.  . isosorbide mononitrate (IMDUR) 30 MG 24  hr tablet Take 1 tablet (30 mg total) by mouth at bedtime. (Patient taking differently: Take 30 mg by mouth daily with supper. )  . nitroGLYCERIN (NITROSTAT) 0.4 MG SL tablet Place 0.4 mg under the tongue every 5 (five) minutes as needed for chest pain.  . Omega-3 Fatty Acids (FISH OIL) 1200 MG CAPS Take 1,200 mg by mouth daily.   . pantoprazole (PROTONIX) 40 MG tablet TAKE 1 TABLET BY MOUTH ONCE DAILY  . thiamine (VITAMIN B-1) 100 MG tablet Take 1 tablet (100 mg total) by mouth daily. (Patient taking differently: Take 100 mg See admin instructions by mouth. Monday, Wednesday and Friday)  . warfarin (COUMADIN) 5 MG tablet Take as directed by [facility]   No facility-administered encounter medications on file as of 07/09/2017.     Activities of Daily Living In your present state of health, do you have any difficulty performing the following activities: 07/09/2017 06/21/2017  Hearing? N N  Vision? N N  Difficulty concentrating or making decisions? [name]  Walking or climbing stairs? N N  Dressing or bathing? N N  Doing errands, shopping? N Y  Conservation officer, nature and eating ? N N  Using the Toilet? N N  In the past six months, have you accidently leaked urine? N N  Do you have problems with loss of bowel control? N N  Managing your Medications? [name]  Comment - daughter in law manages medications  Managing your Finances? Y N  Housekeeping or managing your Housekeeping? Y N  Comment Housekeeper every other week. caregiver helps clean and also has a Chartered certified accountant once a week  Some recent data might be hidden    Patient Care Team: Mosie Lukes, MD as PCP - General (Family Medicine) Thana Ates, RN as [place] Management    Assessment:   This is  a routine wellness examination for Sharren. Physical assessment deferred to PCP.  Exercise Activities and Dietary recommendations Current Exercise Habits: The patient does not participate in regular exercise at present,  Exercise limited by: None identified   Diet (meal preparation, eat out, water intake, caffeinated beverages, dairy products, fruits and vegetables): well balanced   Goals    . DIET - INCREASE WATER INTAKE       Fall Risk Fall Risk  07/09/2017 07/07/2017 06/21/2017 04/14/2017 07/06/2016  Falls in the past year? No No No No Yes  Number falls in past yr: - - - - 1  Follow up - - - - Falls prevention discussed     Depression Screen PHQ 2/9 Scores 07/09/2017 06/21/2017 07/06/2016 03/02/2016  PHQ - 2 Score 0 0 0 0  PHQ- 9 Score - - - -     Cognitive Function MMSE - Mini Mental State Exam 07/09/2017 07/06/2016  Not completed: Unable to complete -  Orientation to time - 2  Orientation to Place - 5  Registration - 3  Attention/ Calculation - 5  Recall - 2  Language- name 2 objects - 2  Language- repeat - 1  Language- follow 3 step command - 3  Language- read & follow direction - 1  Write a sentence - 1  Copy design - 1  Total score - 26        Immunization History  Administered Date(s) Administered  . Influenza Split 12/07/2011  . Influenza Whole 03/28/2007, 01/09/2008, 12/25/2008, 11/27/2009  . Influenza, High Dose Seasonal PF 01/05/2015, 11/26/2015  . Influenza,inj,Quad PF,6+ Mos 12/06/2012, 12/29/2013  . Influenza-Unspecified 01/05/2015, 12/26/2016  . Pneumococcal Polysaccharide-23 01/10/2008, 11/15/2012  . Td 07/04/2007    Screening Tests Health Maintenance  Topic Date Due  . DEXA SCAN  04/17/1998  . PNA vac Low Risk Adult (2 of 2 - PCV13) 11/15/2013  . OPHTHALMOLOGY EXAM  03/18/2016  . URINE MICROALBUMIN  11/25/2016  . TETANUS/TDAP  07/03/2017  . FOOT EXAM  07/06/2017  . HEMOGLOBIN A1C  08/02/2017  . INFLUENZA VACCINE  10/14/2017      Plan:   Follow up with Dr.Blyth as scheduled 07/30/17.  Continue to eat heart healthy diet (full of fruits, vegetables, whole grains, lean protein, water--limit salt, fat, and sugar intake) and increase physical activity as  tolerated.  Continue doing brain stimulating activities (puzzles, reading, adult coloring books, staying active) to keep memory sharp.    I have personally reviewed and noted the following in the patient's chart:   . Medical and social history . Use of alcohol, tobacco or illicit drugs  . Current medications and supplements . Functional ability and status . Nutritional status . Physical activity . Advanced directives . List of other physicians . Hospitalizations, surgeries, and ER visits in previous 12 months . Vitals . Screenings to include cognitive, depression, and falls . Referrals and appointments  In addition, I have reviewed and discussed with patient certain preventive protocols, quality metrics, and best practice recommendations. A written personalized care plan for preventive services as well as general preventive health recommendations were provided to patient.     Naaman Plummer [place], [place]  07/09/2017

## 2017-07-07 ENCOUNTER — Other Ambulatory Visit: Payer: Self-pay

## 2017-07-07 ENCOUNTER — Ambulatory Visit: Payer: Self-pay

## 2017-07-07 NOTE — Patient Outreach (Signed)
Transition of care: Placed call to son who requested I call caregiver Sharlene Dory at [phone].   Placed call to Ms. Overby who reports patient is doing well. Reports being more active. Denies any recent bleeding. Reports that patient had her follow up with primary MD and everything went well.   No new problems or concerns voiced today.  PLAN: will follow up in 1 week.  Tomasa Rand, RN, BSN, CEN [facility] [employer] [phone]

## 2017-07-08 ENCOUNTER — Ambulatory Visit: Payer: Medicare Other | Admitting: *Deleted

## 2017-07-08 DIAGNOSIS — N184 Chronic kidney disease, stage 4 (severe): Secondary | ICD-10-CM | POA: Diagnosis not present

## 2017-07-08 DIAGNOSIS — I82402 Acute embolism and thrombosis of unspecified deep veins of left lower extremity: Secondary | ICD-10-CM | POA: Diagnosis not present

## 2017-07-08 DIAGNOSIS — Z794 Long term (current) use of insulin: Secondary | ICD-10-CM | POA: Diagnosis not present

## 2017-07-08 DIAGNOSIS — E785 Hyperlipidemia, unspecified: Secondary | ICD-10-CM | POA: Diagnosis not present

## 2017-07-08 DIAGNOSIS — I480 Paroxysmal atrial fibrillation: Secondary | ICD-10-CM | POA: Diagnosis not present

## 2017-07-08 DIAGNOSIS — J449 Chronic obstructive pulmonary disease, unspecified: Secondary | ICD-10-CM | POA: Diagnosis not present

## 2017-07-08 DIAGNOSIS — Z951 Presence of aortocoronary bypass graft: Secondary | ICD-10-CM | POA: Diagnosis not present

## 2017-07-08 DIAGNOSIS — D509 Iron deficiency anemia, unspecified: Secondary | ICD-10-CM | POA: Diagnosis not present

## 2017-07-08 DIAGNOSIS — I2510 Atherosclerotic heart disease of native coronary artery without angina pectoris: Secondary | ICD-10-CM | POA: Diagnosis not present

## 2017-07-08 DIAGNOSIS — E1122 Type 2 diabetes mellitus with diabetic chronic kidney disease: Secondary | ICD-10-CM | POA: Diagnosis not present

## 2017-07-08 DIAGNOSIS — I129 Hypertensive chronic kidney disease with stage 1 through stage 4 chronic kidney disease, or unspecified chronic kidney disease: Secondary | ICD-10-CM | POA: Diagnosis not present

## 2017-07-08 DIAGNOSIS — K219 Gastro-esophageal reflux disease without esophagitis: Secondary | ICD-10-CM | POA: Diagnosis not present

## 2017-07-08 DIAGNOSIS — Z7901 Long term (current) use of anticoagulants: Secondary | ICD-10-CM | POA: Diagnosis not present

## 2017-07-08 DIAGNOSIS — Z7902 Long term (current) use of antithrombotics/antiplatelets: Secondary | ICD-10-CM | POA: Diagnosis not present

## 2017-07-08 DIAGNOSIS — I69393 Ataxia following cerebral infarction: Secondary | ICD-10-CM | POA: Diagnosis not present

## 2017-07-09 ENCOUNTER — Ambulatory Visit (INDEPENDENT_AMBULATORY_CARE_PROVIDER_SITE_OTHER): Payer: Medicare Other | Admitting: *Deleted

## 2017-07-09 ENCOUNTER — Encounter: Payer: Self-pay | Admitting: *Deleted

## 2017-07-09 VITALS — BP 136/68 | HR 60 | Ht 67.0 in | Wt 161.0 lb

## 2017-07-09 DIAGNOSIS — Z0000 Encounter for general adult medical examination without abnormal findings: Secondary | ICD-10-CM

## 2017-07-09 NOTE — Patient Instructions (Signed)
Follow up with Dr.Blyth as scheduled 07/30/17.  Continue to eat heart healthy diet (full of fruits, vegetables, whole grains, lean protein, water--limit salt, fat, and sugar intake) and increase physical activity as tolerated.  Continue doing brain stimulating activities (puzzles, reading, adult coloring books, staying active) to keep memory sharp.    Destiny [name] , Thank you for taking time to come for your Medicare Wellness Visit. I appreciate your ongoing commitment to your health goals. Please review the following plan we discussed and let me know if I can assist you in the future.     This is a list of the screening recommended for you and due dates:  Health Maintenance  Topic Date Due  . DEXA scan (bone density measurement)  04/17/1998  . Pneumonia vaccines (2 of 2 - PCV13) 11/15/2013  . Eye exam for diabetics  03/18/2016  . Urine Protein Check  11/25/2016  . Tetanus Vaccine  07/03/2017  . Complete foot exam   07/06/2017  . Hemoglobin A1C  08/02/2017  . Flu Shot  10/14/2017    Health Maintenance for Postmenopausal Women Menopause is a normal process in which your reproductive ability comes to an end. This process happens gradually over a span of months to years, usually between the ages of 44 and 41. Menopause is complete when you have missed 12 consecutive menstrual periods. It is important to talk with your health care provider about some of the most common conditions that affect postmenopausal women, such as heart disease, cancer, and bone loss (osteoporosis). Adopting a healthy lifestyle and getting preventive care can help to promote your health and wellness. Those actions can also lower your chances of developing some of these common conditions. What should I know about menopause? During menopause, you may experience a number of symptoms, such as:  Moderate-to-severe hot flashes.  Night sweats.  Decrease in sex drive.  Mood  swings.  Headaches.  Tiredness.  Irritability.  Memory problems.  Insomnia.  Choosing to treat or not to treat menopausal changes is an individual decision that you make with your health care provider. What should I know about hormone replacement therapy and supplements? Hormone therapy products are effective for treating symptoms that are associated with menopause, such as hot flashes and night sweats. Hormone replacement carries certain risks, especially as you become older. If you are thinking about using estrogen or estrogen with progestin treatments, discuss the benefits and risks with your health care provider. What should I know about heart disease and stroke? Heart disease, heart attack, and stroke become more likely as you age. This may be due, in part, to the hormonal changes that your body experiences during menopause. These can affect how your body processes dietary fats, triglycerides, and cholesterol. Heart attack and stroke are both medical emergencies. There are many things that you can do to help prevent heart disease and stroke:  Have your blood pressure checked at least every 1-2 years. High blood pressure causes heart disease and increases the risk of stroke.  If you are 9-11 years old, ask your health care provider if you should take aspirin to prevent a heart attack or a stroke.  Do not use any tobacco products, including cigarettes, chewing tobacco, or electronic cigarettes. If you need help quitting, ask your health care provider.  It is important to eat a healthy diet and maintain a healthy weight. ? Be sure to include plenty of vegetables, fruits, low-fat dairy products, and lean protein. ? Avoid eating foods that are  high in solid fats, added sugars, or salt (sodium).  Get regular exercise. This is one of the most important things that you can do for your health. ? Try to exercise for at least 150 minutes each week. The type of exercise that you do should  increase your heart rate and make you sweat. This is known as moderate-intensity exercise. ? Try to do strengthening exercises at least twice each week. Do these in addition to the moderate-intensity exercise.  Know your numbers.Ask your health care provider to check your cholesterol and your blood glucose. Continue to have your blood tested as directed by your health care provider.  What should I know about cancer screening? There are several types of cancer. Take the following steps to reduce your risk and to catch any cancer development as early as possible. Breast Cancer  Practice breast self-awareness. ? This means understanding how your breasts normally appear and feel. ? It also means doing regular breast self-exams. Let your health care provider know about any changes, no matter how small.  If you are 28 or older, have a clinician do a breast exam (clinical breast exam or CBE) every year. Depending on your age, family history, and medical history, it may be recommended that you also have a yearly breast X-ray (mammogram).  If you have a family history of breast cancer, talk with your health care provider about genetic screening.  If you are at high risk for breast cancer, talk with your health care provider about having an MRI and a mammogram every year.  Breast cancer (BRCA) gene test is recommended for women who have family members with BRCA-related cancers. Results of the assessment will determine the need for genetic counseling and BRCA1 and for BRCA2 testing. BRCA-related cancers include these types: ? Breast. This occurs in males or females. ? Ovarian. ? Tubal. This may also be called fallopian tube cancer. ? Cancer of the abdominal or pelvic lining (peritoneal cancer). ? Prostate. ? Pancreatic.  Cervical, Uterine, and Ovarian Cancer Your health care provider may recommend that you be screened regularly for cancer of the pelvic organs. These include your ovaries, uterus,  and vagina. This screening involves a pelvic exam, which includes checking for microscopic changes to the surface of your cervix (Pap test).  For women ages 21-65, health care providers may recommend a pelvic exam and a Pap test every three years. For women ages 24-65, they may recommend the Pap test and pelvic exam, combined with testing for human papilloma virus (HPV), every five years. Some types of HPV increase your risk of cervical cancer. Testing for HPV may also be done on women of any age who have unclear Pap test results.  Other health care providers may not recommend any screening for nonpregnant women who are considered low risk for pelvic cancer and have no symptoms. Ask your health care provider if a screening pelvic exam is right for you.  If you have had past treatment for cervical cancer or a condition that could lead to cancer, you need Pap tests and screening for cancer for at least 20 years after your treatment. If Pap tests have been discontinued for you, your risk factors (such as having a new sexual partner) need to be reassessed to determine if you should start having screenings again. Some women have medical problems that increase the chance of getting cervical cancer. In these cases, your health care provider may recommend that you have screening and Pap tests more often.  If  you have a family history of uterine cancer or ovarian cancer, talk with your health care provider about genetic screening.  If you have vaginal bleeding after reaching menopause, tell your health care provider.  There are currently no reliable tests available to screen for ovarian cancer.  Lung Cancer Lung cancer screening is recommended for adults 85-16 years old who are at high risk for lung cancer because of a history of smoking. A yearly low-dose CT scan of the lungs is recommended if you:  Currently smoke.  Have a history of at least 30 pack-years of smoking and you currently smoke or have quit  within the past 15 years. A pack-year is smoking an average of one pack of cigarettes per day for one year.  Yearly screening should:  Continue until it has been 15 years since you quit.  Stop if you develop a health problem that would prevent you from having lung cancer treatment.  Colorectal Cancer  This type of cancer can be detected and can often be prevented.  Routine colorectal cancer screening usually begins at age 63 and continues through age 66.  If you have risk factors for colon cancer, your health care provider may recommend that you be screened at an earlier age.  If you have a family history of colorectal cancer, talk with your health care provider about genetic screening.  Your health care provider may also recommend using home test kits to check for hidden blood in your stool.  A small camera at the end of a tube can be used to examine your colon directly (sigmoidoscopy or colonoscopy). This is done to check for the earliest forms of colorectal cancer.  Direct examination of the colon should be repeated every 5-10 years until age 9. However, if early forms of precancerous polyps or small growths are found or if you have a family history or genetic risk for colorectal cancer, you may need to be screened more often.  Skin Cancer  Check your skin from head to toe regularly.  Monitor any moles. Be sure to tell your health care provider: ? About any new moles or changes in moles, especially if there is a change in a mole's shape or color. ? If you have a mole that is larger than the size of a pencil eraser.  If any of your family members has a history of skin cancer, especially at a young age, talk with your health care provider about genetic screening.  Always use sunscreen. Apply sunscreen liberally and repeatedly throughout the day.  Whenever you are outside, protect yourself by wearing long sleeves, pants, a wide-brimmed hat, and sunglasses.  What should I know  about osteoporosis? Osteoporosis is a condition in which bone destruction happens more quickly than new bone creation. After menopause, you may be at an increased risk for osteoporosis. To help prevent osteoporosis or the bone fractures that can happen because of osteoporosis, the following is recommended:  If you are 81-26 years old, get at least 1,000 mg of calcium and at least 600 mg of vitamin D per day.  If you are older than age 26 but younger than age 2, get at least 1,200 mg of calcium and at least 600 mg of vitamin D per day.  If you are older than age 28, get at least 1,200 mg of calcium and at least 800 mg of vitamin D per day.  Smoking and excessive alcohol intake increase the risk of osteoporosis. Eat foods that are rich in  calcium and vitamin D, and do weight-bearing exercises several times each week as directed by your health care provider. What should I know about how menopause affects my mental health? Depression may occur at any age, but it is more common as you become older. Common symptoms of depression include:  Low or sad mood.  Changes in sleep patterns.  Changes in appetite or eating patterns.  Feeling an overall lack of motivation or enjoyment of activities that you previously enjoyed.  Frequent crying spells.  Talk with your health care provider if you think that you are experiencing depression. What should I know about immunizations? It is important that you get and maintain your immunizations. These include:  Tetanus, diphtheria, and pertussis (Tdap) booster vaccine.  Influenza every year before the flu season begins.  Pneumonia vaccine.  Shingles vaccine.  Your health care provider may also recommend other immunizations. This information is not intended to replace advice given to you by your health care provider. Make sure you discuss any questions you have with your health care provider. Document Released: 04/24/2005 Document Revised: 09/20/2015  Document Reviewed: 12/04/2014 Elsevier Interactive Patient Education  2018 [employer].

## 2017-07-09 NOTE — Progress Notes (Signed)
RN note reviewed and agree.  Zora Glendenning S O'Sullivan NP 

## 2017-07-12 ENCOUNTER — Other Ambulatory Visit: Payer: Self-pay

## 2017-07-12 NOTE — Patient Outreach (Signed)
Transition of care:  Placed call to patient and spoke with caregiver Sharlene Dory.  Ms. Narda Bonds states that patient is doing well. Denies any recent bleeding. Reports patient is being active.  Ms. Narda Bonds denies any medications problems at this time.   PLAN: will make a final transition of care call next week.   Tomasa Rand, RN, BSN, CEN [facility] [employer] [phone]

## 2017-07-13 ENCOUNTER — Ambulatory Visit: Payer: Self-pay

## 2017-07-13 ENCOUNTER — Encounter: Payer: Self-pay | Admitting: Adult Health

## 2017-07-13 ENCOUNTER — Ambulatory Visit: Payer: Medicare Other | Admitting: Adult Health

## 2017-07-13 ENCOUNTER — Telehealth: Payer: Self-pay

## 2017-07-13 NOTE — Progress Notes (Deleted)
[facility] [address] [place]. [place] [address] [phone]       OFFICE FOLLOW-UP NOTE  Ms. [name] Date of Birth:  [birth year] Medical Record Number:  [id number]   HPI: Ms Dicenso is a pleasant 84 year Caucasian lady seen today for first office follow-up visit following hospital admission for stroke in November 2019. History is obtained from the patient, son and caregiver who accompany her today for this visit. I have personally reviewed hospital electronic medical records and imaging films.Chloe Bluett Brooksis an 84 y.o.femalewith past medical history of coronary artery disease status post CABG, hypertension, diabetes, GI bleeding, TIAs, CKD, mild dementiawho presents with sudden onset aphasia noticed her on 6:30 - 7 pm.The patient was apparently normal this morning however around 3 PM home health noticed that she had a dazed look and wasconfused.This lasted for a few minutes and the patient returned to her baseline. Her granddaughter checked on her at5 PM and grandson around 5:20 PM and she was perfectly fine and had was having a normal conversation. However son calledbetween 6:30 and 7 PM,she seemed very confused and not making sense. He called 911 and when paramedics arrived it was noted the patient had difficulty with language. Blood pressure was 619 systolic on arrival.Patient was brought to to [facility] ER around9PM. Examination was consistent for isolated aphasiathe patient having difficulty naming, repetition and paraphasic errors. CT head was negative. CTA was not performed due toCKDand low suspicion of largevessel occlusion. After discussing risks versus benefits with family and decided to administer TPA. TPA was administered after lowering blood pressure with labetalol. Date last known well:11.19.18 Time last known well:5.20 pm.tPA Given:yes. NIHSS:3.Baseline MRS2. The patient showed neurological improvement shortly after getting TPA. She  was monitored in intensive care unit and blood pressure was tightly controlled. MRI scan of the brain did not show any acute infarct. MRA of the neck showed possible high-grade left ICA stenosis however carotid ultrasound showed only 1-39% stenosis bilaterally. EEG shows focal left-sided and diffuse generalized slowing but no epileptiform activity. LDL cholesterol was 77 mg percent and hemoglobin A1c was 8.4. Patient was started on Plavix for stroke prevention.  04/14/17 visit: She has done well since discharge. She is living at home. She had mild Alzheimer's dementia at baseline. In the past she has not tolerated or gained any benefit from Aricept and Namenda and in fact per her son she showed improvement after those medications were discontinued. She has a caregiver for 4 hours daily. She is alone at night. Her daughter lives right next door and keeps a close watch on her. Patient is tolerating Plavix without bleeding or bruising. Her blood pressure appears to be reasonably well controlled and today it is 141/73. Her fasting sugars continue to be high. She is on insulin and sees her medical doctor regularly. She's had no recurrent episodes of speech disturbance or seizure-like activity. She does have a remote history of possible TIA 4 years ago when she had sudden onset of facial drooping and nearly blacked out. This was transient and she refused to go to the hospital. She saw her primary care physician a few days later and brain imaging was unremarkable.  Admission 05/21/17: Left DVT and new onset atrial fibrillation - started on warfarin and stopped plavix. Discharged home in stable condition  Admission 06/10/17: lower GI bleed - help warfarin during admission and restarted once stable. Warfarin dose adjustment made. Discharged home in stable condition.   Update 07/13/17:  ROS:   14 system review of systems is positive for   memory loss, speech difficulty and all other systems negative. PMH:  Past  Medical History:  Diagnosis Date  . Acute renal insufficiency 12/24/2013  . Amaurosis fugax   . Arthritis 12/06/2012  . Atherosclerosis   . CAD (coronary artery disease)    a. s/p CABG x 4 (LIMA->LAD, VG->Diag, VG->OM1->OM3);  b. 2010 Cath: 3/4 patent grafts (VG->diag occluded), 3vd, sev PVD ->Med Rx;  c. 12/2011 Lexi MV: sm area of mild mid and apical ant wall ischemia ->med rx.  . Carotid bruit   . Chronic kidney disease (CKD) stage G4/A1, severely decreased glomerular filtration rate (GFR) between 15-29 mL/min/1.73 square meter and albuminuria creatinine ratio less than 30 mg/g (HCC)    GFR 25 on 12/25/13  . CVA (cerebral infarction)   . Dementia    pt denies  . Depression 12/24/2013  . Diabetes mellitus type II    insulin dependent.   . Diabetic peripheral neuropathy ([place])   . Diabetic retinopathy   . Diverticulosis   . Duodenal ulcer 2009 and 11/2011   caused GI bleeding  . Gallstones 2009   seen on CT scan  . GI bleed 04/2011, 11/2011   found non-bleeding duodenal ulcers  . Hyperlipidemia   . Hypersomnolence 03/12/2013  . Hypertension   . Iron deficiency anemia secondary to blood loss (chronic)   . Melena 1015/2015   PEPTIC ULCER    REQUIRING TRANSFUSION  . Pedal edema 10/12/2016  . PVD (peripheral vascular disease) ([place])   . Rib fracture 04/21/2015  . Thiamine deficiency 08/13/2015  . UTI (urinary tract infection) 04/08/2014  . Weight loss, non-intentional 01/29/2014    Social History:  Social History   Socioeconomic History  . Marital status: Widowed    Spouse name: Not on file  . Number of children: 4  . Years of education: Not on file  . Highest education level: Not on file  Occupational History  . Occupation: retired    Fish farm manager: RETIRED  Social Needs  . Financial resource strain: Not on file  . Food insecurity:    Worry: Not on file    Inability: Not on file  . Transportation needs:    Medical: Not on file    Non-medical: Not on file  Tobacco Use  .  Smoking status: Former Smoker    Types: Cigarettes    Last attempt to quit: 12/31/2000    Years since quitting: 16.5  . Smokeless tobacco: Never Used  Substance and Sexual Activity  . Alcohol use: No  . Drug use: No  . Sexual activity: Not on file  Lifestyle  . Physical activity:    Days per week: Not on file    Minutes per session: Not on file  . Stress: Not on file  Relationships  . Social connections:    Talks on phone: Not on file    Gets together: Not on file    Attends religious service: Not on file    Active member of club or organization: Not on file    Attends meetings of clubs or organizations: Not on file    Relationship status: Not on file  . Intimate partner violence:    Fear of current or ex partner: Not on file    Emotionally abused: Not on file    Physically abused: Not on file    Forced sexual activity: Not on file  Other Topics Concern  . Not on file  Social History Narrative   Widowed - husband passed away in 05/26/2009   lives with daughter with epilepsy - mental retardation   Retired     Former Smoker      Alcohol use-no          Occupation: Retired       son - Vicie Cech     Medications:   Current Outpatient Medications on File Prior to Visit  Medication Sig Dispense Refill  . albuterol (PROAIR HFA) 108 (90 Base) MCG/ACT inhaler Inhale 2 puffs into the lungs every 6 (six) hours as needed for wheezing or shortness of breath. 18 g 2  . amLODipine (NORVASC) 5 MG tablet Take 1 tablet (5 mg total) by mouth daily. 90 tablet 1  . atorvastatin (LIPITOR) 20 MG tablet Take 1 tablet (20 mg total) by mouth daily. 90 tablet 1  . Cholecalciferol (VITAMIN D) 1000 UNITS capsule Take 1,000 Units by mouth daily.     . citalopram (CELEXA) 20 MG tablet Take 1 tablet (20 mg total) by mouth daily. 90 tablet 1  . ferrous sulfate 325 (65 FE) MG tablet Take 325 mg daily with breakfast by mouth.    . insulin glargine (LANTUS) 100 UNIT/ML injection Inject 0.1 mLs (10 Units  total) into the skin at bedtime. (Patient taking differently: Inject 25 Units into the skin daily. ) 10 mL 1  . insulin lispro (HUMALOG KWIKPEN) 100 UNIT/ML KiwkPen Inject 0.05-0.1 mLs (5-10 Units total) into the skin 3 (three) times daily with meals. 15 mL 4  . ipratropium (ATROVENT HFA) 17 MCG/ACT inhaler Inhale 2 puffs into the lungs every 6 (six) hours as needed for wheezing. 1 Inhaler 12  . ipratropium-albuterol (DUONEB) 0.5-2.5 (3) MG/3ML SOLN Take 3 mLs by nebulization every 6 (six) hours as needed. 360 mL 1  . isosorbide mononitrate (IMDUR) 30 MG 24 hr tablet Take 1 tablet (30 mg total) by mouth at bedtime. (Patient taking differently: Take 30 mg by mouth daily with supper. ) 90 tablet 1  . nitroGLYCERIN (NITROSTAT) 0.4 MG SL tablet Place 0.4 mg under the tongue every 5 (five) minutes as needed for chest pain.    . Omega-3 Fatty Acids (FISH OIL) 1200 MG CAPS Take 1,200 mg by mouth daily.     . pantoprazole (PROTONIX) 40 MG tablet TAKE 1 TABLET BY MOUTH ONCE DAILY 90 tablet 0  . thiamine (VITAMIN B-1) 100 MG tablet Take 1 tablet (100 mg total) by mouth daily. (Patient taking differently: Take 100 mg See admin instructions by mouth. Monday, Wednesday and Friday) 30 tablet 5  . warfarin (COUMADIN) 5 MG tablet Take as directed by [facility]madin Clinic 30 tablet 1   No current facility-administered medications on file prior to visit.     Allergies:  No Known Allergies  Physical Exam General: well developed, well nourished elderly Caucasian lady, seated, in no evident distress Head: head normocephalic and atraumatic.  Neck: supple with no carotid or supraclavicular bruits Cardiovascular: regular rate and rhythm, no murmurs Musculoskeletal: no deformity Skin:  no rash/petichiae Vascular:  Normal pulses all extremities There were no vitals filed for this visit. Neurologic Exam Mental Status: Awake and fully alert. Oriented to place and time. Recent and remote memory poort. Attention span,  concentration and fund of knowledge diminished Mood and affect appropriate. Recall 0/3. Able to name only 4 animals with 4 legs. Clock drawing 3/4. Cranial Nerves: Fundoscopic exam reveals sharp disc margins. Pupils equal, briskly reactive to light. Extraocular movements full without  nystagmus. Visual fields full to confrontation. Hearing intact. Facial sensation intact. Face, tongue, palate moves normally and symmetrically.  Motor: Normal bulk and tone. Normal strength in all tested extremity muscles. Sensory.: intact to touch ,pinprick .position and vibratory sensation.  Coordination: Rapid alternating movements normal in all extremities. Finger-to-nose and heel-to-shin performed accurately bilaterally. Gait and Station: Arises from chair without difficulty. Stance is normal. Gait demonstrates normal stride length and balance . Able to heel, toe and tandem walk without difficulty.  Reflexes: 1+ and symmetric. Toes downgoing.   NIHSS  1 Modified Rankin  1   ASSESSMENT: 84 year old Caucasian lady with transient episode of speech disturbance and expressive aphasia likely left hemispheric TIA.multiple vascular risk factors of diabetes, hypertension, hyperlipidemia and age Complex partial seizure less likely. Long-standing history of mild Alzheimer's dementia    PLAN:   Continue clopidogrel 75 mg daily   She may consider possible participation in the [employer] dementia study if interested   Continue {anticoagulants:31417}  and ***  for secondary stroke prevention   Maintain strict control of hypertension with blood pressure goal below 130/90, diabetes with hemoglobin A1c goal below 6.5% and cholesterol with LDL cholesterol (bad cholesterol) goal below 70 mg/dL. I also advised the patient to eat a healthy diet with plenty of whole grains, cereals, fruits and vegetables, exercise regularly and maintain ideal body weight.  Followup in the future with me in ***  Greater than 50% time  during this *** minute consultation visit was spent on counseling and coordination of care about ***, *** (risk factors), discussion about risk benefit of anticoagulation and answering questions.   Venancio Poisson, AGNP-BC  [facility] [address] [place], [place] [address]  Phone [phone] Fax [phone]

## 2017-07-13 NOTE — Telephone Encounter (Signed)
Patient no show for appt today. 

## 2017-07-14 DIAGNOSIS — D649 Anemia, unspecified: Secondary | ICD-10-CM | POA: Diagnosis not present

## 2017-07-14 DIAGNOSIS — E1122 Type 2 diabetes mellitus with diabetic chronic kidney disease: Secondary | ICD-10-CM | POA: Diagnosis not present

## 2017-07-14 DIAGNOSIS — Z951 Presence of aortocoronary bypass graft: Secondary | ICD-10-CM | POA: Diagnosis not present

## 2017-07-14 DIAGNOSIS — I129 Hypertensive chronic kidney disease with stage 1 through stage 4 chronic kidney disease, or unspecified chronic kidney disease: Secondary | ICD-10-CM | POA: Diagnosis not present

## 2017-07-14 DIAGNOSIS — N184 Chronic kidney disease, stage 4 (severe): Secondary | ICD-10-CM | POA: Diagnosis not present

## 2017-07-19 ENCOUNTER — Other Ambulatory Visit: Payer: Self-pay

## 2017-07-20 ENCOUNTER — Ambulatory Visit: Payer: Medicare Other | Admitting: Pharmacist

## 2017-07-20 DIAGNOSIS — I4891 Unspecified atrial fibrillation: Secondary | ICD-10-CM

## 2017-07-20 DIAGNOSIS — Z5181 Encounter for therapeutic drug level monitoring: Secondary | ICD-10-CM

## 2017-07-20 DIAGNOSIS — I824Z2 Acute embolism and thrombosis of unspecified deep veins of left distal lower extremity: Secondary | ICD-10-CM | POA: Diagnosis not present

## 2017-07-20 LAB — POCT INR: INR: 2

## 2017-07-20 NOTE — Patient Outreach (Signed)
Transition of care/ case closure:  Placed call to patients caregiver Sharlene Dory who reports patient is doing well. Reports no recent bleeding. Reports patient is being more active.   Reports patients has follow up appointments with cardiology and [facility] within the next week. Denies any new problems or concerns today.  Caregiver reports weight is 159-162 pounds.  PLAN: reviewed case closure with caregiver.  Reviewed signs of bleeding and encouraged caregiver to call MD or notify family for any signs of bleeding.  Reviewed with caregiver to call for future needs and or concerns. She agreed. Will mail case closure letter and to patient and MD.  [facility] CM Care Plan Problem One     Most Recent Value  Care Plan Problem One  Recent admission for GI bleed  Role Documenting the Problem One  Care Management [place] for Problem One  Active  [facility] Long Term Goal   Patient and or caregiver will report no evidence of bleeding in the next 31 days.   [facility] Long Term Goal Start Date  06/21/17  [facility] Long Term Goal Met Date  07/20/17  [facility] CM Short Term Goal #1   Patient will reports attending primary MD follow up post hospital discharge within 2 weeks.   [facility] CM Short Term Goal #1 Start Date  06/21/17  [facility] CM Short Term Goal #1 Met Date  07/07/17  [facility] CM Short Term Goal #2   Pateint will reports increasing acitvity daily for the next 30 days.   [facility] CM Short Term Goal #2 Start Date  06/21/17  [facility] CM Short Term Goal #2 Met Date  07/12/17     Tomasa Rand, RN, BSN, CEN [place] Coordinator [phone]

## 2017-07-20 NOTE — Patient Instructions (Signed)
Description   Continue taking 2.5mg  daily except 5mg  on Mondays.  Recheck INR in 3 weeks. Please call daughter Lenore Manner with dose instructions. Coumadin clinic [phone].

## 2017-07-21 DIAGNOSIS — I129 Hypertensive chronic kidney disease with stage 1 through stage 4 chronic kidney disease, or unspecified chronic kidney disease: Secondary | ICD-10-CM | POA: Diagnosis not present

## 2017-07-23 MED FILL — ATORVASTATIN 20 MG TABLET: 20 | 90 days supply | Qty: 90 | Fill #1

## 2017-07-30 ENCOUNTER — Encounter: Payer: Self-pay | Admitting: Family Medicine

## 2017-07-30 ENCOUNTER — Ambulatory Visit (INDEPENDENT_AMBULATORY_CARE_PROVIDER_SITE_OTHER): Payer: Medicare Other | Admitting: Family Medicine

## 2017-07-30 ENCOUNTER — Ambulatory Visit (HOSPITAL_BASED_OUTPATIENT_CLINIC_OR_DEPARTMENT_OTHER)
Admission: RE | Admit: 2017-07-30 | Discharge: 2017-07-30 | Disposition: A | Discharge status: Home / Self Care | Payer: Medicare Other | Source: Ambulatory Visit | Attending: Family Medicine | Admitting: Family Medicine

## 2017-07-30 VITALS — BP 122/60 | HR 45 | Temp 97.9°F | Resp 18 | Wt 165.8 lb

## 2017-07-30 DIAGNOSIS — Z794 Long term (current) use of insulin: Secondary | ICD-10-CM

## 2017-07-30 DIAGNOSIS — E119 Type 2 diabetes mellitus without complications: Secondary | ICD-10-CM | POA: Diagnosis not present

## 2017-07-30 DIAGNOSIS — E1140 Type 2 diabetes mellitus with diabetic neuropathy, unspecified: Secondary | ICD-10-CM | POA: Diagnosis not present

## 2017-07-30 DIAGNOSIS — N390 Urinary tract infection, site not specified: Secondary | ICD-10-CM

## 2017-07-30 DIAGNOSIS — R600 Localized edema: Secondary | ICD-10-CM | POA: Diagnosis not present

## 2017-07-30 DIAGNOSIS — I10 Essential (primary) hypertension: Secondary | ICD-10-CM

## 2017-07-30 DIAGNOSIS — Z7901 Long term (current) use of anticoagulants: Secondary | ICD-10-CM

## 2017-07-30 MED FILL — AMLODIPINE BESYLATE 5 MG TA: 5 | 90 days supply | Qty: 90 | Fill #1

## 2017-07-30 MED FILL — ISOSORBIDE MN ER 30 MG TAB: 30 | 90 days supply | Qty: 90 | Fill #1

## 2017-07-30 NOTE — Progress Notes (Signed)
Subjective:  I acted as a Education administrator for Dr. [name]. [name], [place]  Patient ID: Destiny [name], female    DOB: 1931-06-22, 84 y.o.   MRN: [id number]  No chief complaint on file.   HPI  Patient is in today for a 3 month follow up and she is accompanied by an aide that works with her regularly. She feels well except for increased swelling of right lower extremity over the past couple of days. No falls or trauma. No redness or warmth. Has not been elevating her foot. No recent febrile illness or hospitalizations. No polyuria or polydipsia. Denies CP/palp/SOB/HA/congestion/fevers/GI or GU c/o. Taking meds as prescribed  Patient Care Team: Mosie Lukes, MD as PCP - General (Family Medicine)   Past Medical History:  Diagnosis Date  . Acute renal insufficiency 12/24/2013  . Amaurosis fugax   . Arthritis 12/06/2012  . Atherosclerosis   . CAD (coronary artery disease)    a. s/p CABG x 4 (LIMA->LAD, VG->Diag, VG->OM1->OM3);  b. 2010 Cath: 3/4 patent grafts (VG->diag occluded), 3vd, sev PVD ->Med Rx;  c. 12/2011 Lexi MV: sm area of mild mid and apical ant wall ischemia ->med rx.  . Carotid bruit   . Chronic kidney disease (CKD) stage G4/A1, severely decreased glomerular filtration rate (GFR) between 15-29 mL/min/1.73 square meter and albuminuria creatinine ratio less than 30 mg/g (HCC)    GFR 25 on 12/25/13  . CVA (cerebral infarction)   . Dementia    pt denies  . Depression 12/24/2013  . Diabetes mellitus type II    insulin dependent.   . Diabetic peripheral neuropathy ([place])   . Diabetic retinopathy   . Diverticulosis   . Duodenal ulcer 2009 and 11/2011   caused GI bleeding  . Gallstones 2009   seen on CT scan  . GI bleed 04/2011, 11/2011   found non-bleeding duodenal ulcers  . Hyperlipidemia   . Hypersomnolence 03/12/2013  . Hypertension   . Iron deficiency anemia secondary to blood loss (chronic)   . Melena 1015/2015   PEPTIC ULCER    REQUIRING TRANSFUSION  . Pedal edema 10/12/2016    . PVD (peripheral vascular disease) ([place])   . Rib fracture 04/21/2015  . Thiamine deficiency 08/13/2015  . UTI (urinary tract infection) 04/08/2014  . Weight loss, non-intentional 01/29/2014    Past Surgical History:  Procedure Laterality Date  . ABDOMINAL HYSTERECTOMY    . COLONOSCOPY  05/20/2011   Procedure: COLONOSCOPY;  Surgeon: Scarlette Shorts, MD;  Location: [place];  Service: Endoscopy;  Laterality: N/A;  . COLONOSCOPY WITH PROPOFOL N/A 06/09/2017   Procedure: COLONOSCOPY WITH PROPOFOL;  Surgeon: Milus Banister, MD;  Location: [facility] ENDOSCOPY;  Service: Endoscopy;  Laterality: N/A;  . CORONARY ARTERY BYPASS GRAFT  1999   x 4  . ENTEROSCOPY N/A 12/29/2013   Procedure: ENTEROSCOPY;  Surgeon: Inda Castle, MD;  Location: [place];  Service: Endoscopy;  Laterality: N/A;  . ESOPHAGOGASTRODUODENOSCOPY  05/16/2011   Procedure: ESOPHAGOGASTRODUODENOSCOPY (EGD);  Surgeon: Gatha Mayer, MD;  Location[facility] ENDOSCOPY;  Service: Endoscopy;  Laterality: N/A;  . ESOPHAGOGASTRODUODENOSCOPY  12/08/2011   Procedure: ESOPHAGOGASTRODUODENOSCOPY (EGD);  Surgeon: Ladene Artist, MD,FACG;  Location: [facility] ENDOSCOPY;  Service: Endoscopy;  Laterality: N/A;  . ESOPHAGOGASTRODUODENOSCOPY (EGD) WITH PROPOFOL N/A 06/08/2017   Procedure: ESOPHAGOGASTRODUODENOSCOPY (EGD) WITH PROPOFOL;  Surgeon: Milus Banister, MD;  Location: [facility] ENDOSCOPY;  Service: Endoscopy;  Laterality: N/A;  . UPPER GASTROINTESTINAL ENDOSCOPY  02/22/2008   w/biopsy, duedenal ulcer, antrum erosions  Family History  Problem Relation Age of Onset  . Coronary artery disease Father   . Hypertension Father   . Stroke Father   . Cancer Mother        ?  . Diabetes Maternal Uncle   . Stroke Sister   . Colon cancer Neg Hx     Social History   Socioeconomic History  . Marital status: Widowed    Spouse name: Not on file  . Number of children: 4  . Years of education: Not on file  . Highest education level: Not on file  Occupational  History  . Occupation: retired    Fish farm manager: RETIRED  Social Needs  . Financial resource strain: Not on file  . Food insecurity:    Worry: Not on file    Inability: Not on file  . Transportation needs:    Medical: Not on file    Non-medical: Not on file  Tobacco Use  . Smoking status: Former Smoker    Types: Cigarettes    Last attempt to quit: 12/31/2000    Years since quitting: 16.5  . Smokeless tobacco: Never Used  Substance and Sexual Activity  . Alcohol use: No  . Drug use: No  . Sexual activity: Not on file  Lifestyle  . Physical activity:    Days per week: Not on file    Minutes per session: Not on file  . Stress: Not on file  Relationships  . Social connections:    Talks on phone: Not on file    Gets together: Not on file    Attends religious service: Not on file    Active member of club or organization: Not on file    Attends meetings of clubs or organizations: Not on file    Relationship status: Not on file  . Intimate partner violence:    Fear of current or ex partner: Not on file    Emotionally abused: Not on file    Physically abused: Not on file    Forced sexual activity: Not on file  Other Topics Concern  . Not on file  Social History Narrative   Widowed - husband passed away in 06/03/09   lives with daughter with epilepsy - mental retardation   Retired     Former Smoker      Alcohol use-no          Occupation: Retired       son - Joniece Smotherman     Outpatient Medications Prior to Visit  Medication Sig Dispense Refill  . albuterol (PROAIR HFA) 108 (90 Base) MCG/ACT inhaler Inhale 2 puffs into the lungs every 6 (six) hours as needed for wheezing or shortness of breath. 18 g 2  . amLODipine (NORVASC) 5 MG tablet Take 1 tablet (5 mg total) by mouth daily. 90 tablet 1  . atorvastatin (LIPITOR) 20 MG tablet Take 1 tablet (20 mg total) by mouth daily. 90 tablet 1  . Cholecalciferol (VITAMIN D) 1000 UNITS capsule Take 1,000 Units by mouth daily.     .  citalopram (CELEXA) 20 MG tablet Take 1 tablet (20 mg total) by mouth daily. 90 tablet 1  . ferrous sulfate 325 (65 FE) MG tablet Take 325 mg daily with breakfast by mouth.    . insulin glargine (LANTUS) 100 UNIT/ML injection Inject 0.1 mLs (10 Units total) into the skin at bedtime. (Patient taking differently: Inject 25 Units into the skin daily. ) 10 mL 1  . insulin lispro (HUMALOG KWIKPEN)  100 UNIT/ML KiwkPen Inject 0.05-0.1 mLs (5-10 Units total) into the skin 3 (three) times daily with meals. 15 mL 4  . ipratropium (ATROVENT HFA) 17 MCG/ACT inhaler Inhale 2 puffs into the lungs every 6 (six) hours as needed for wheezing. 1 Inhaler 12  . ipratropium-albuterol (DUONEB) 0.5-2.5 (3) MG/3ML SOLN Take 3 mLs by nebulization every 6 (six) hours as needed. 360 mL 1  . isosorbide mononitrate (IMDUR) 30 MG 24 hr tablet Take 1 tablet (30 mg total) by mouth at bedtime. (Patient taking differently: Take 30 mg by mouth daily with supper. ) 90 tablet 1  . nitroGLYCERIN (NITROSTAT) 0.4 MG SL tablet Place 0.4 mg under the tongue every 5 (five) minutes as needed for chest pain.    . Omega-3 Fatty Acids (FISH OIL) 1200 MG CAPS Take 1,200 mg by mouth daily.     . pantoprazole (PROTONIX) 40 MG tablet TAKE 1 TABLET BY MOUTH ONCE DAILY 90 tablet 0  . thiamine (VITAMIN B-1) 100 MG tablet Take 1 tablet (100 mg total) by mouth daily. (Patient taking differently: Take 100 mg See admin instructions by mouth. Monday, Wednesday and Friday) 30 tablet 5  . warfarin (COUMADIN) 5 MG tablet Take as directed by Coumadin Clinic 30 tablet 1   No facility-administered medications prior to visit.     No Known Allergies  Review of Systems  Constitutional: Negative for fever and malaise/fatigue.  HENT: Negative for congestion.   Eyes: Negative for blurred vision.  Respiratory: Negative for shortness of breath.   Cardiovascular: Positive for leg swelling. Negative for chest pain and palpitations.  Gastrointestinal: Negative for  abdominal pain, blood in stool and nausea.  Genitourinary: Negative for dysuria and frequency.  Musculoskeletal: Positive for myalgias. Negative for falls.  Skin: Negative for rash.  Neurological: Negative for dizziness, loss of consciousness and headaches.  Endo/Heme/Allergies: Negative for environmental allergies.  Psychiatric/Behavioral: Positive for memory loss. Negative for depression. The patient is not nervous/anxious.        Objective:    Physical Exam  Constitutional: No distress.  HENT:  Left Ear: External ear normal.  Mouth/Throat: No oropharyngeal exudate.  Eyes: EOM are normal. Left eye exhibits no discharge. No scleral icterus.  Neck: No JVD present. No tracheal deviation present.  Cardiovascular: Intact distal pulses.  Irregularly irregular  Pulmonary/Chest: No respiratory distress. She has no rales.  Abdominal: She exhibits no distension and no mass. There is no tenderness. There is no guarding.  Musculoskeletal: She exhibits edema. She exhibits no tenderness.  Lymphadenopathy:    She has no cervical adenopathy.  Skin: No rash noted. No erythema.    BP 122/60 (BP Location: Left Arm, Patient Position: Sitting, Cuff Size: Normal)   Pulse (!) 45   Temp 97.9 F (36.6 C) (Oral)   Resp 18   Wt 165 lb 12.8 oz (75.2 kg)   SpO2 95%   BMI 25.97 kg/m  Wt Readings from Last 3 Encounters:  07/30/17 165 lb 12.8 oz (75.2 kg)  07/09/17 161 lb (73 kg)  06/25/17 162 lb (73.5 kg)   BP Readings from Last 3 Encounters:  07/30/17 122/60  07/09/17 136/68  06/25/17 140/80     Immunization History  Administered Date(s) Administered  . Influenza Split 12/07/2011  . Influenza Whole 03/28/2007, 01/09/2008, 12/25/2008, 11/27/2009  . Influenza, High Dose Seasonal PF 01/05/2015, 11/26/2015  . Influenza,inj,Quad PF,6+ Mos 12/06/2012, 12/29/2013  . Influenza-Unspecified 01/05/2015, 12/26/2016  . Pneumococcal Polysaccharide-23 01/10/2008, 11/15/2012  . Td 07/04/2007  Health Maintenance  Topic Date Due  . DEXA SCAN  04/17/1998  . PNA vac Low Risk Adult (2 of 2 - PCV13) 11/15/2013  . OPHTHALMOLOGY EXAM  03/18/2016  . URINE MICROALBUMIN  11/25/2016  . TETANUS/TDAP  07/03/2017  . FOOT EXAM  07/06/2017  . HEMOGLOBIN A1C  08/02/2017  . INFLUENZA VACCINE  10/14/2017    Lab Results  Component Value Date   WBC 6.9 06/25/2017   HGB 12.1 06/25/2017   HCT 35.2 (L) 06/25/2017   PLT 226.0 06/25/2017   GLUCOSE 171 (H) 06/25/2017   CHOL 167 02/02/2017   TRIG 272 (H) 02/02/2017   HDL 36 (L) 02/02/2017   LDLDIRECT 128.0 01/26/2017   LDLCALC 77 02/02/2017   ALT 16 06/09/2017   AST 14 (L) 06/09/2017   NA 144 06/25/2017   K 4.7 06/25/2017   CL 108 06/25/2017   CREATININE 1.85 (H) 06/25/2017   BUN 37 (H) 06/25/2017   CO2 29 06/25/2017   TSH 2.03 01/26/2017   INR 2.0 07/20/2017   HGBA1C 8.4 (H) 02/02/2017   MICROALBUR 97.4 (H) 11/26/2015    Lab Results  Component Value Date   TSH 2.03 01/26/2017   Lab Results  Component Value Date   WBC 6.9 06/25/2017   HGB 12.1 06/25/2017   HCT 35.2 (L) 06/25/2017   MCV 91.5 06/25/2017   PLT 226.0 06/25/2017   Lab Results  Component Value Date   NA 144 06/25/2017   K 4.7 06/25/2017   CO2 29 06/25/2017   GLUCOSE 171 (H) 06/25/2017   BUN 37 (H) 06/25/2017   CREATININE 1.85 (H) 06/25/2017   BILITOT 1.3 (H) 06/09/2017   ALKPHOS 65 06/09/2017   AST 14 (L) 06/09/2017   ALT 16 06/09/2017   PROT 6.4 (L) 06/09/2017   ALBUMIN 3.3 (L) 06/09/2017   CALCIUM 9.2 06/25/2017   ANIONGAP 10 06/10/2017   GFR 27.59 (L) 06/25/2017   Lab Results  Component Value Date   CHOL 167 02/02/2017   Lab Results  Component Value Date   HDL 36 (L) 02/02/2017   Lab Results  Component Value Date   LDLCALC 77 02/02/2017   Lab Results  Component Value Date   TRIG 272 (H) 02/02/2017   Lab Results  Component Value Date   CHOLHDL 4.6 02/02/2017   Lab Results  Component Value Date   HGBA1C 8.4 (H) 02/02/2017          Assessment & Plan:   Problem List Items Addressed This Visit    Essential hypertension (Chronic)    Well controlled, no changes to meds. Encouraged heart healthy diet such as the DASH diet and exercise as tolerated.       Diabetes mellitus type 2, insulin dependent (HCC) (Chronic)    hgba1c acceptable, minimize simple carbs. Increase exercise as tolerated. Continue current meds      Type 2 diabetes mellitus with diabetic neuropathy, unspecified (HCC)    hgba1c acceptable, minimize simple carbs. Increase exercise as tolerated.       UTI (urinary tract infection)    No urinary complaints today no further checks.       Pedal edema - Primary    Notes increased swelling in right leg and some calf pain is noted. Ultrasound is negative. Encouraged compression hose, elevate feet above heart.       Relevant Orders   US Venous Img Lower Unilateral Right (Completed)   Anticoagulated on Coumadin    Tolerating coumadin and no concerns identified today  I am having [name] maintain her Vitamin D, Fish Oil, nitroGLYCERIN, thiamine, insulin lispro, ferrous sulfate, amLODipine, atorvastatin, citalopram, isosorbide mononitrate, ipratropium, albuterol, insulin glargine, ipratropium-albuterol, warfarin, and pantoprazole.  No orders of the defined types were placed in this encounter.   CMA served as Education administrator during this visit. History, Physical and Plan performed by medical provider. Documentation and orders reviewed and attested to.  Penni Homans, MD

## 2017-07-30 NOTE — Assessment & Plan Note (Signed)
hgba1c acceptable, minimize simple carbs. Increase exercise as tolerated.  

## 2017-07-30 NOTE — Assessment & Plan Note (Signed)
Well controlled, no changes to meds. Encouraged heart healthy diet such as the DASH diet and exercise as tolerated.  °

## 2017-07-30 NOTE — Patient Instructions (Signed)
Edema Edema is an abnormal buildup of fluids in your bodytissues. Edema is somewhatdependent on gravity to pull the fluid to the lowest place in your body. That makes the condition more common in the legs and thighs (lower extremities). Painless swelling of the feet and ankles is common and becomes more likely as you get older. It is also common in looser tissues, like around your eyes. When the affected area is squeezed, the fluid may move out of that spot and leave a dent for a few moments. This dent is called pitting. What are the causes? There are many possible causes of edema. Eating too much salt and being on your feet or sitting for a long time can cause edema in your legs and ankles. Hot weather may make edema worse. Common medical causes of edema include:  Heart failure.  Liver disease.  Kidney disease.  Weak blood vessels in your legs.  Cancer.  An injury.  Pregnancy.  Some medications.  Obesity.  What are the signs or symptoms? Edema is usually painless.Your skin may look swollen or shiny. How is this diagnosed? Your health care provider may be able to diagnose edema by asking about your medical history and doing a physical exam. You may need to have tests such as X-rays, an electrocardiogram, or blood tests to check for medical conditions that may cause edema. How is this treated? Edema treatment depends on the cause. If you have heart, liver, or kidney disease, you need the treatment appropriate for these conditions. General treatment may include:  Elevation of the affected body part above the level of your heart.  Compression of the affected body part. Pressure from elastic bandages or support stockings squeezes the tissues and forces fluid back into the blood vessels. This keeps fluid from entering the tissues.  Restriction of fluid and salt intake.  Use of a water pill (diuretic). These medications are appropriate only for some types of edema. They pull fluid  out of your body and make you urinate more often. This gets rid of fluid and reduces swelling, but diuretics can have side effects. Only use diuretics as directed by your health care provider.  Follow these instructions at home:  Keep the affected body part above the level of your heart when you are lying down.  Do not sit still or stand for prolonged periods.  Do not put anything directly under your knees when lying down.  Do not wear constricting clothing or garters on your upper legs.  Exercise your legs to work the fluid back into your blood vessels. This may help the swelling go down.  Wear elastic bandages or support stockings to reduce ankle swelling as directed by your health care provider.  Eat a low-salt diet to reduce fluid if your health care provider recommends it.  Only take medicines as directed by your health care provider. Contact a health care provider if:  Your edema is not responding to treatment.  You have heart, liver, or kidney disease and notice symptoms of edema.  You have edema in your legs that does not improve after elevating them.  You have sudden and unexplained weight gain. Get help right away if:  You develop shortness of breath or chest pain.  You cannot breathe when you lie down.  You develop pain, redness, or warmth in the swollen areas.  You have heart, liver, or kidney disease and suddenly get edema.  You have a fever and your symptoms suddenly get worse. This information is   not intended to replace advice given to you by your health care provider. Make sure you discuss any questions you have with your health care provider. Document Released: 03/02/2005 Document Revised: 08/08/2015 Document Reviewed: 12/23/2012 Elsevier Interactive Patient Education  2017 [employer]  

## 2017-08-01 NOTE — Assessment & Plan Note (Signed)
Notes increased swelling in right leg and some calf pain is noted. Ultrasound is negative. Encouraged compression hose, elevate feet above heart.

## 2017-08-01 NOTE — Assessment & Plan Note (Signed)
No urinary complaints today no further checks.

## 2017-08-01 NOTE — Assessment & Plan Note (Signed)
hgba1c acceptable, minimize simple carbs. Increase exercise as tolerated. Continue current meds 

## 2017-08-01 NOTE — Assessment & Plan Note (Signed)
Tolerating coumadin and no concerns identified today

## 2017-08-03 ENCOUNTER — Telehealth: Payer: Self-pay | Admitting: Cardiovascular Disease

## 2017-08-03 NOTE — Telephone Encounter (Signed)
Called Lincoln with [facility] and left detailed message that our office received her fax, and Dr. Johnsie Cancel will sign the next time he is in the office 08/11/17.

## 2017-08-03 NOTE — Telephone Encounter (Signed)
Melinda/[facility]   Need to know status of orders for PT begin date 4.1.19 that was faxed. Please advis4e

## 2017-08-04 MED FILL — LANTUS SOLOSTAR 100 UNITS/M: 100 | 22 days supply | Qty: 9 | Fill #3

## 2017-08-10 ENCOUNTER — Ambulatory Visit: Payer: Medicare Other

## 2017-08-10 DIAGNOSIS — I4891 Unspecified atrial fibrillation: Secondary | ICD-10-CM | POA: Diagnosis not present

## 2017-08-10 DIAGNOSIS — I824Z2 Acute embolism and thrombosis of unspecified deep veins of left distal lower extremity: Secondary | ICD-10-CM

## 2017-08-10 DIAGNOSIS — Z5181 Encounter for therapeutic drug level monitoring: Secondary | ICD-10-CM

## 2017-08-10 LAB — POCT INR: INR: 2.7 (ref 2.0–3.0)

## 2017-08-10 NOTE — Patient Instructions (Signed)
Description   Continue taking 2.5mg  daily except 5mg  on Mondays.  Recheck INR in 4 weeks. Please call daughter Lenore Manner with dose instructions. Coumadin clinic [phone].

## 2017-08-11 ENCOUNTER — Encounter: Payer: Self-pay | Admitting: Family Medicine

## 2017-08-25 ENCOUNTER — Other Ambulatory Visit: Payer: Self-pay

## 2017-08-25 MED ORDER — ISOSORBIDE MONONITRATE ER 30 MG PO TB24: 30.0000 mg | ORAL_TABLET | Freq: Every day | ORAL | 1 refills | Status: DC

## 2017-08-25 MED ORDER — WARFARIN SODIUM 5 MG PO TABS: ORAL_TABLET | ORAL | 1 refills | Status: DC

## 2017-08-25 MED ORDER — PANTOPRAZOLE SODIUM 40 MG PO TBEC: 40.0000 mg | DELAYED_RELEASE_TABLET | Freq: Every day | ORAL | 0 refills | Status: DC

## 2017-08-25 MED ORDER — AMLODIPINE BESYLATE 5 MG PO TABS: 5.0000 mg | ORAL_TABLET | Freq: Every day | ORAL | 1 refills | Status: DC

## 2017-08-25 MED ORDER — INSULIN GLARGINE 100 UNIT/ML ~~LOC~~ SOLN: 10.0000 [IU] | Freq: Every day | SUBCUTANEOUS | 1 refills | Status: DC

## 2017-08-28 ENCOUNTER — Encounter: Payer: Self-pay | Admitting: Family Medicine

## 2017-09-03 ENCOUNTER — Encounter: Payer: Self-pay | Admitting: Cardiovascular Disease

## 2017-09-03 ENCOUNTER — Other Ambulatory Visit: Payer: Self-pay

## 2017-09-03 MED ORDER — INSULIN GLARGINE 100 UNIT/ML ~~LOC~~ SOLN: SUBCUTANEOUS | 1 refills | Status: DC

## 2017-09-03 MED FILL — LANTUS SOLOSTAR 100 UNITS/M: 100 | 85 days supply | Qty: 30 | Fill #0

## 2017-09-06 ENCOUNTER — Encounter: Payer: Self-pay | Admitting: Family Medicine

## 2017-09-06 MED FILL — CITALOPRAM HBR 20 MG TABLET: 20 | 90 days supply | Qty: 90 | Fill #1

## 2017-09-06 MED FILL — raNITIdine HCL 300 MG TABS: 300 | 90 days supply | Qty: 45 | Fill #2

## 2017-09-07 ENCOUNTER — Ambulatory Visit: Payer: Medicare Other | Admitting: *Deleted

## 2017-09-07 DIAGNOSIS — Z5181 Encounter for therapeutic drug level monitoring: Secondary | ICD-10-CM | POA: Diagnosis not present

## 2017-09-07 DIAGNOSIS — I4891 Unspecified atrial fibrillation: Secondary | ICD-10-CM | POA: Diagnosis not present

## 2017-09-07 DIAGNOSIS — I824Z2 Acute embolism and thrombosis of unspecified deep veins of left distal lower extremity: Secondary | ICD-10-CM | POA: Diagnosis not present

## 2017-09-07 DIAGNOSIS — Z8673 Personal history of transient ischemic attack (TIA), and cerebral infarction without residual deficits: Secondary | ICD-10-CM | POA: Diagnosis not present

## 2017-09-07 LAB — POCT INR: INR: 3.1 — AB (ref 2.0–3.0)

## 2017-09-07 NOTE — Patient Instructions (Signed)
Description   Do not take coumadin today June25th then continue taking 2.5mg  daily except 5mg  on Mondays.  Recheck INR in 3 weeks. Please call daughter Lenore Manner with dose instructions. Coumadin clinic [phone].

## 2017-09-09 ENCOUNTER — Other Ambulatory Visit: Payer: Self-pay | Admitting: Family Medicine

## 2017-09-09 ENCOUNTER — Encounter: Payer: Self-pay | Admitting: Podiatry

## 2017-09-09 ENCOUNTER — Ambulatory Visit: Payer: Medicare Other | Admitting: Podiatry

## 2017-09-09 ENCOUNTER — Encounter: Payer: Self-pay | Admitting: Family Medicine

## 2017-09-09 DIAGNOSIS — M79674 Pain in right toe(s): Secondary | ICD-10-CM | POA: Diagnosis not present

## 2017-09-09 DIAGNOSIS — M79675 Pain in left toe(s): Secondary | ICD-10-CM

## 2017-09-09 DIAGNOSIS — I825Z2 Chronic embolism and thrombosis of unspecified deep veins of left distal lower extremity: Secondary | ICD-10-CM

## 2017-09-09 DIAGNOSIS — E1149 Type 2 diabetes mellitus with other diabetic neurological complication: Secondary | ICD-10-CM | POA: Diagnosis not present

## 2017-09-09 DIAGNOSIS — B351 Tinea unguium: Secondary | ICD-10-CM

## 2017-09-09 NOTE — Progress Notes (Signed)
Subjective:   Patient ID: Destiny [name], female   DOB: 84 y.o.   MRN: [id number]   HPI 84 year old female presents the office today with her daughter for concerns of thick, painful, elongated toenails that she cannot trim herself.  She denies any redness or drainage so that he also has been there painful with pressure in shoes.  She is diabetic but she states that she does not know what her blood sugar is.   Review of Systems  All other systems reviewed and are negative.   Past Medical History:  Diagnosis Date  . Acute renal insufficiency 12/24/2013  . Amaurosis fugax   . Arthritis 12/06/2012  . Atherosclerosis   . CAD (coronary artery disease)    a. s/p CABG x 4 (LIMA->LAD, VG->Diag, VG->OM1->OM3);  b. 2010 Cath: 3/4 patent grafts (VG->diag occluded), 3vd, sev PVD ->Med Rx;  c. 12/2011 Lexi MV: sm area of mild mid and apical ant wall ischemia ->med rx.  . Carotid bruit   . Chronic kidney disease (CKD) stage G4/A1, severely decreased glomerular filtration rate (GFR) between 15-29 mL/min/1.73 square meter and albuminuria creatinine ratio less than 30 mg/g (HCC)    GFR 25 on 12/25/13  . CVA (cerebral infarction)   . Dementia    pt denies  . Depression 12/24/2013  . Diabetes mellitus type II    insulin dependent.   . Diabetic peripheral neuropathy ([place])   . Diabetic retinopathy   . Diverticulosis   . Duodenal ulcer 2009 and 11/2011   caused GI bleeding  . Gallstones 2009   seen on CT scan  . GI bleed 04/2011, 11/2011   found non-bleeding duodenal ulcers  . Hyperlipidemia   . Hypersomnolence 03/12/2013  . Hypertension   . Iron deficiency anemia secondary to blood loss (chronic)   . Melena 1015/2015   PEPTIC ULCER    REQUIRING TRANSFUSION  . Pedal edema 10/12/2016  . PVD (peripheral vascular disease) ([place])   . Rib fracture 04/21/2015  . Thiamine deficiency 08/13/2015  . UTI (urinary tract infection) 04/08/2014  . Weight loss, non-intentional 01/29/2014    Past Surgical  History:  Procedure Laterality Date  . ABDOMINAL HYSTERECTOMY    . COLONOSCOPY  05/20/2011   Procedure: COLONOSCOPY;  Surgeon: Scarlette Shorts, MD;  Location: [facility];  Service: Endoscopy;  Laterality: N/A;  . COLONOSCOPY WITH PROPOFOL N/A 06/09/2017   Procedure: COLONOSCOPY WITH PROPOFOL;  Surgeon: Milus Banister, MD;  Location: [facility] ENDOSCOPY;  Service: Endoscopy;  Laterality: N/A;  . CORONARY ARTERY BYPASS GRAFT  1999   x 4  . ENTEROSCOPY N/A 12/29/2013   Procedure: ENTEROSCOPY;  Surgeon: Inda Castle, MD;  Location: [facility];  Service: Endoscopy;  Laterality: N/A;  . ESOPHAGOGASTRODUODENOSCOPY  05/16/2011   Procedure: ESOPHAGOGASTRODUODENOSCOPY (EGD);  Surgeon: Gatha Mayer, MD;  Location[facility] ENDOSCOPY;  Service: Endoscopy;  Laterality: N/A;  . ESOPHAGOGASTRODUODENOSCOPY  12/08/2011   Procedure: ESOPHAGOGASTRODUODENOSCOPY (EGD);  Surgeon: Ladene Artist, MD,FACG;  Location: [facility] ENDOSCOPY;  Service: Endoscopy;  Laterality: N/A;  . ESOPHAGOGASTRODUODENOSCOPY (EGD) WITH PROPOFOL N/A 06/08/2017   Procedure: ESOPHAGOGASTRODUODENOSCOPY (EGD) WITH PROPOFOL;  Surgeon: Milus Banister, MD;  Location: [facility] ENDOSCOPY;  Service: Endoscopy;  Laterality: N/A;  . UPPER GASTROINTESTINAL ENDOSCOPY  02/22/2008   w/biopsy, duedenal ulcer, antrum erosions     Current Outpatient Medications:  .  albuterol (PROAIR HFA) 108 (90 Base) MCG/ACT inhaler, Inhale 2 puffs into the lungs every 6 (six) hours as needed for wheezing or shortness of breath., Disp: 18  g, Rfl: 2 .  amLODipine (NORVASC) 5 MG tablet, Take 1 tablet (5 mg total) by mouth daily., Disp: 90 tablet, Rfl: 1 .  atorvastatin (LIPITOR) 20 MG tablet, Take 1 tablet (20 mg total) by mouth daily., Disp: 90 tablet, Rfl: 1 .  Cholecalciferol (VITAMIN D) 1000 UNITS capsule, Take 1,000 Units by mouth daily. , Disp: , Rfl:  .  citalopram (CELEXA) 20 MG tablet, Take 1 tablet (20 mg total) by mouth daily., Disp: 90 tablet, Rfl: 1 .  ferrous sulfate 325 (65 FE)  MG tablet, Take 325 mg daily with breakfast by mouth., Disp: , Rfl:  .  insulin glargine (LANTUS) 100 UNIT/ML injection, Inject 24mL [place] at bedtime, Disp: 1500 mL, Rfl: 1 .  insulin lispro (HUMALOG KWIKPEN) 100 UNIT/ML KiwkPen, Inject 0.05-0.1 mLs (5-10 Units total) into the skin 3 (three) times daily with meals., Disp: 15 mL, Rfl: 4 .  ipratropium (ATROVENT HFA) 17 MCG/ACT inhaler, Inhale 2 puffs into the lungs every 6 (six) hours as needed for wheezing., Disp: 1 Inhaler, Rfl: 12 .  ipratropium-albuterol (DUONEB) 0.5-2.5 (3) MG/3ML SOLN, Take 3 mLs by nebulization every 6 (six) hours as needed., Disp: 360 mL, Rfl: 1 .  isosorbide mononitrate (IMDUR) 30 MG 24 hr tablet, Take 1 tablet (30 mg total) by mouth at bedtime., Disp: 90 tablet, Rfl: 1 .  nitroGLYCERIN (NITROSTAT) 0.4 MG SL tablet, Place 0.4 mg under the tongue every 5 (five) minutes as needed for chest pain., Disp: , Rfl:  .  Omega-3 Fatty Acids (FISH OIL) 1200 MG CAPS, Take 1,200 mg by mouth daily. , Disp: , Rfl:  .  pantoprazole (PROTONIX) 40 MG tablet, Take 1 tablet (40 mg total) by mouth daily., Disp: 90 tablet, Rfl: 0 .  thiamine (VITAMIN B-1) 100 MG tablet, Take 1 tablet (100 mg total) by mouth daily. (Patient taking differently: Take 100 mg See admin instructions by mouth. Monday, Wednesday and Friday), Disp: 30 tablet, Rfl: 5 .  warfarin (COUMADIN) 5 MG tablet, Take as directed by Coumadin Clinic, Disp: 30 tablet, Rfl: 1  No Known Allergies  Social History   Socioeconomic History  . Marital status: Widowed    Spouse name: Not on file  . Number of children: 4  . Years of education: Not on file  . Highest education level: Not on file  Occupational History  . Occupation: retired    Fish farm manager: RETIRED  Social Needs  . Financial resource strain: Not on file  . Food insecurity:    Worry: Not on file    Inability: Not on file  . Transportation needs:    Medical: Not on file    Non-medical: Not on file  Tobacco Use  . Smoking  status: Former Smoker    Types: Cigarettes    Last attempt to quit: 12/31/2000    Years since quitting: 16.7  . Smokeless tobacco: Never Used  Substance and Sexual Activity  . Alcohol use: No  . Drug use: No  . Sexual activity: Not on file  Lifestyle  . Physical activity:    Days per week: Not on file    Minutes per session: Not on file  . Stress: Not on file  Relationships  . Social connections:    Talks on phone: Not on file    Gets together: Not on file    Attends religious service: Not on file    Active member of club or organization: Not on file    Attends meetings of clubs or organizations:  Not on file    Relationship status: Not on file  . Intimate partner violence:    Fear of current or ex partner: Not on file    Emotionally abused: Not on file    Physically abused: Not on file    Forced sexual activity: Not on file  Other Topics Concern  . Not on file  Social History Narrative   Widowed - husband passed away in 05/20/09   lives with daughter with epilepsy - mental retardation   Retired     Former Smoker      Alcohol use-no          Occupation: Retired       son - Cristi Gwynn        Objective:  Physical Exam  General: AAO x3, NAD  Dermatological: Nails are hypertrophic, dystrophic, brittle, discolored, elongated 10. No surrounding redness or drainage. Tenderness nails 1-5 bilaterally. No open lesions or pre-ulcerative lesions are identified today.  Vascular: DP pulses 2/4, PT pulses 1/4, CRT less than 3 seconds.  Neruologic: Sensation decreased with Derrel Nip monofilament.  Musculoskeletal: No pain, crepitus, or limitation noted with foot and ankle range of motion bilateral.       Assessment:  84 year old female with symptomatic onychomycosis     Plan:  -Treatment options discussed including all alternatives, risks, and complications -Etiology of symptoms were discussed -Nails debrided 10 without complications or bleeding. -Discussed  importance of glucose control. -Daily foot inspection -Follow-up in 3 months or sooner if any problems arise. In the meantime, encouraged to call the office with any questions, concerns, change in symptoms.   Celesta Gentile, DPM

## 2017-09-14 ENCOUNTER — Other Ambulatory Visit: Payer: Self-pay

## 2017-09-14 MED ORDER — BLOOD GLUCOSE MONITOR KIT: PACK | 0 refills | Status: DC

## 2017-09-22 ENCOUNTER — Ambulatory Visit (HOSPITAL_BASED_OUTPATIENT_CLINIC_OR_DEPARTMENT_OTHER): Payer: Medicare Other

## 2017-09-27 ENCOUNTER — Ambulatory Visit (HOSPITAL_BASED_OUTPATIENT_CLINIC_OR_DEPARTMENT_OTHER)
Admission: RE | Admit: 2017-09-27 | Discharge: 2017-09-27 | Disposition: A | Discharge status: Home / Self Care | Payer: Medicare Other | Source: Ambulatory Visit | Attending: Family Medicine | Admitting: Family Medicine

## 2017-09-27 DIAGNOSIS — M7122 Synovial cyst of popliteal space [Baker], left knee: Secondary | ICD-10-CM | POA: Insufficient documentation

## 2017-09-27 DIAGNOSIS — Z86718 Personal history of other venous thrombosis and embolism: Secondary | ICD-10-CM | POA: Insufficient documentation

## 2017-09-27 DIAGNOSIS — M79662 Pain in left lower leg: Secondary | ICD-10-CM | POA: Insufficient documentation

## 2017-09-27 DIAGNOSIS — I825Z2 Chronic embolism and thrombosis of unspecified deep veins of left distal lower extremity: Secondary | ICD-10-CM | POA: Diagnosis present

## 2017-09-27 DIAGNOSIS — R600 Localized edema: Secondary | ICD-10-CM | POA: Diagnosis not present

## 2017-09-28 ENCOUNTER — Encounter: Payer: Self-pay | Admitting: Family Medicine

## 2017-09-28 ENCOUNTER — Ambulatory Visit: Payer: Medicare Other | Admitting: Family Medicine

## 2017-09-28 VITALS — BP 128/78 | HR 101 | Temp 97.9°F | Resp 16 | Ht 67.0 in | Wt 161.8 lb

## 2017-09-28 DIAGNOSIS — E119 Type 2 diabetes mellitus without complications: Secondary | ICD-10-CM | POA: Diagnosis not present

## 2017-09-28 DIAGNOSIS — D509 Iron deficiency anemia, unspecified: Secondary | ICD-10-CM | POA: Diagnosis not present

## 2017-09-28 DIAGNOSIS — E519 Thiamine deficiency, unspecified: Secondary | ICD-10-CM | POA: Diagnosis not present

## 2017-09-28 DIAGNOSIS — I4891 Unspecified atrial fibrillation: Secondary | ICD-10-CM

## 2017-09-28 DIAGNOSIS — E782 Mixed hyperlipidemia: Secondary | ICD-10-CM

## 2017-09-28 DIAGNOSIS — I10 Essential (primary) hypertension: Secondary | ICD-10-CM

## 2017-09-28 DIAGNOSIS — N184 Chronic kidney disease, stage 4 (severe): Secondary | ICD-10-CM

## 2017-09-28 DIAGNOSIS — I82509 Chronic embolism and thrombosis of unspecified deep veins of unspecified lower extremity: Secondary | ICD-10-CM

## 2017-09-28 DIAGNOSIS — Z794 Long term (current) use of insulin: Secondary | ICD-10-CM

## 2017-09-28 DIAGNOSIS — E1140 Type 2 diabetes mellitus with diabetic neuropathy, unspecified: Secondary | ICD-10-CM | POA: Diagnosis not present

## 2017-09-28 DIAGNOSIS — R600 Localized edema: Secondary | ICD-10-CM | POA: Diagnosis not present

## 2017-09-28 DIAGNOSIS — Z8673 Personal history of transient ischemic attack (TIA), and cerebral infarction without residual deficits: Secondary | ICD-10-CM

## 2017-09-28 DIAGNOSIS — E538 Deficiency of other specified B group vitamins: Secondary | ICD-10-CM | POA: Diagnosis not present

## 2017-09-28 LAB — COMPREHENSIVE METABOLIC PANEL
ALBUMIN: 3.9 g/dL (ref 3.5–5.2)
ALK PHOS: 71 U/L (ref 39–117)
ALT: 14 U/L (ref 0–35)
AST: 12 U/L (ref 0–37)
BUN: 38 mg/dL — AB (ref 6–23)
CALCIUM: 9 mg/dL (ref 8.4–10.5)
CO2: 29 mEq/L (ref 19–32)
CREATININE: 2.02 mg/dL — AB (ref 0.40–1.20)
Chloride: 105 mEq/L (ref 96–112)
GFR: 24.91 mL/min — ABNORMAL LOW (ref >60.00)
Glucose, Bld: 212 mg/dL — ABNORMAL HIGH (ref 70–99)
POTASSIUM: 4.1 meq/L (ref 3.5–5.1)
SODIUM: 141 meq/L (ref 135–145)
Total Bilirubin: 0.9 mg/dL (ref 0.2–1.2)
Total Protein: 6.8 g/dL (ref 6.0–8.3)

## 2017-09-28 LAB — LIPID PANEL
CHOLESTEROL: 182 mg/dL (ref 0–200)
HDL: 42.7 mg/dL (ref >39.00)
LDL Cholesterol: 100 mg/dL — ABNORMAL HIGH (ref 0–99)
NonHDL: 139.57
TRIGLYCERIDES: 196 mg/dL — AB (ref 0.0–149.0)
Total CHOL/HDL Ratio: 4
VLDL: 39.2 mg/dL (ref 0.0–40.0)

## 2017-09-28 LAB — VITAMIN B12: Vitamin B-12: 330 pg/mL (ref 211–911)

## 2017-09-28 LAB — CBC
HEMATOCRIT: 37.4 % (ref 36.0–46.0)
HEMOGLOBIN: 12.7 g/dL (ref 12.0–15.0)
MCHC: 33.9 g/dL (ref 30.0–36.0)
MCV: 91.2 fl (ref 78.0–100.0)
PLATELETS: 192 10*3/uL (ref 150.0–400.0)
RBC: 4.1 Mil/uL (ref 3.87–5.11)
RDW: 14.4 % (ref 11.5–15.5)
WBC: 7.5 10*3/uL (ref 4.0–10.5)

## 2017-09-28 LAB — HEMOGLOBIN A1C: Hgb A1c MFr Bld: 7.5 % — ABNORMAL HIGH (ref 4.6–6.5)

## 2017-09-28 LAB — TSH: TSH: 1.87 u[IU]/mL (ref 0.35–4.50)

## 2017-09-28 MED ORDER — WARFARIN SODIUM 5 MG PO TABS: ORAL_TABLET | ORAL | 1 refills | Status: DC

## 2017-09-28 MED ORDER — FUROSEMIDE 20 MG PO TABS: 20.0000 mg | ORAL_TABLET | Freq: Every day | ORAL | 3 refills | Status: DC | PRN

## 2017-09-28 NOTE — Assessment & Plan Note (Addendum)
Follows with Nishan and follows with their coumadin clinic, they have agreed it is appropriate to stop the Coumadin and start on ECASA 81 mg daily

## 2017-09-28 NOTE — Patient Instructions (Signed)
Follows with Johnsie Cancel and has a coumadin appt tomorrow. Her Ultrasound revealed resolution of DVT but with h/o paroxysmal afib they will discuss discontinuation of Coumadin with cardiology. She has increased risk of falls and bleeding due to debility and h/o falls. May benefit from considering switch to aspirin. They will likely need to schedule an appointment with Dr Johnsie Cancel to discuss and will continue coumadin for now. Hypertension Hypertension is another name for high blood pressure. High blood pressure forces your heart to work harder to pump blood. This can cause problems over time. There are two numbers in a blood pressure reading. There is a top number (systolic) over a bottom number (diastolic). It is best to have a blood pressure below 120/80. Healthy choices can help lower your blood pressure. You may need medicine to help lower your blood pressure if:  Your blood pressure cannot be lowered with healthy choices.  Your blood pressure is higher than 130/80.  Follow these instructions at home: Eating and drinking  If directed, follow the DASH eating plan. This diet includes: ? Filling half of your plate at each meal with fruits and vegetables. ? Filling one quarter of your plate at each meal with whole grains. Whole grains include whole wheat pasta, brown rice, and whole grain bread. ? Eating or drinking low-fat dairy products, such as skim milk or low-fat yogurt. ? Filling one quarter of your plate at each meal with low-fat (lean) proteins. Low-fat proteins include fish, skinless chicken, eggs, beans, and tofu. ? Avoiding fatty meat, cured and processed meat, or chicken with skin. ? Avoiding premade or processed food.  Eat less than 1,500 mg of salt (sodium) a day.  Limit alcohol use to no more than 1 drink a day for nonpregnant women and 2 drinks a day for men. One drink equals 12 oz of beer, 5 oz of wine, or 1 oz of hard liquor. Lifestyle  Work with your doctor to stay at a healthy  weight or to lose weight. Ask your doctor what the best weight is for you.  Get at least 30 minutes of exercise that causes your heart to beat faster (aerobic exercise) most days of the week. This may include walking, swimming, or biking.  Get at least 30 minutes of exercise that strengthens your muscles (resistance exercise) at least 3 days a week. This may include lifting weights or pilates.  Do not use any products that contain nicotine or tobacco. This includes cigarettes and e-cigarettes. If you need help quitting, ask your doctor.  Check your blood pressure at home as told by your doctor.  Keep all follow-up visits as told by your doctor. This is important. Medicines  Take over-the-counter and prescription medicines only as told by your doctor. Follow directions carefully.  Do not skip doses of blood pressure medicine. The medicine does not work as well if you skip doses. Skipping doses also puts you at risk for problems.  Ask your doctor about side effects or reactions to medicines that you should watch for. Contact a doctor if:  You think you are having a reaction to the medicine you are taking.  You have headaches that keep coming back (recurring).  You feel dizzy.  You have swelling in your ankles.  You have trouble with your vision. Get help right away if:  You get a very bad headache.  You start to feel confused.  You feel weak or numb.  You feel faint.  You get very bad pain in your: ?  Chest. ? Belly (abdomen).  You throw up (vomit) more than once.  You have trouble breathing. Summary  Hypertension is another name for high blood pressure.  Making healthy choices can help lower blood pressure. If your blood pressure cannot be controlled with healthy choices, you may need to take medicine. This information is not intended to replace advice given to you by your health care provider. Make sure you discuss any questions you have with your health care  provider. Document Released: 08/19/2007 Document Revised: 01/29/2016 Document Reviewed: 01/29/2016 Elsevier Interactive Patient Education  [employer].

## 2017-09-29 ENCOUNTER — Ambulatory Visit: Payer: Medicare Other | Admitting: *Deleted

## 2017-09-29 DIAGNOSIS — I4891 Unspecified atrial fibrillation: Secondary | ICD-10-CM | POA: Diagnosis not present

## 2017-09-29 DIAGNOSIS — Z5181 Encounter for therapeutic drug level monitoring: Secondary | ICD-10-CM

## 2017-09-29 DIAGNOSIS — I824Z2 Acute embolism and thrombosis of unspecified deep veins of left distal lower extremity: Secondary | ICD-10-CM

## 2017-09-29 DIAGNOSIS — Z8673 Personal history of transient ischemic attack (TIA), and cerebral infarction without residual deficits: Secondary | ICD-10-CM

## 2017-09-29 LAB — POCT INR: INR: 2.3 (ref 2.0–3.0)

## 2017-09-29 NOTE — Patient Instructions (Signed)
Description   Continue taking 2.5mg  daily except 5mg  on Mondays.  Recheck INR in 4 weeks. Please call daughter Lenore Manner with dose instructions. Coumadin clinic [phone]. Spoke with caregiver who will give this encounter note to [facility] with  dosing instructions  Message to Dr Johnsie Cancel regarding office visit note of Dr Randel Pigg on 09/28/2017

## 2017-10-01 ENCOUNTER — Other Ambulatory Visit: Payer: Self-pay

## 2017-10-01 MED ORDER — FUROSEMIDE 20 MG PO TABS: 20.0000 mg | ORAL_TABLET | Freq: Every day | ORAL | 3 refills | Status: DC | PRN

## 2017-10-01 MED ORDER — ASPIRIN EC 81 MG PO TBEC: 81.0000 mg | DELAYED_RELEASE_TABLET | Freq: Every day | ORAL | Status: DC

## 2017-10-02 LAB — VITAMIN B1: Vitamin B1 (Thiamine): 103 nmol/L — ABNORMAL HIGH (ref 8–30)

## 2017-10-03 NOTE — Assessment & Plan Note (Signed)
Well controlled, no changes to meds. Encouraged heart healthy diet such as the DASH diet and exercise as tolerated. impoved on recheck

## 2017-10-03 NOTE — Assessment & Plan Note (Signed)
Over treated will stop supplementaion and monitor

## 2017-10-03 NOTE — Assessment & Plan Note (Signed)
Tolerating statin, encouraged heart healthy diet, avoid trans fats, minimize simple carbs and saturated fats. Increase exercise as tolerated 

## 2017-10-03 NOTE — Assessment & Plan Note (Signed)
hgba1c acceptable, minimize simple carbs. Increase exercise as tolerated. Continue current meds 

## 2017-10-03 NOTE — Progress Notes (Signed)
Subjective:    Patient ID: Destiny [name], female    DOB: February 16, 1932, 84 y.o.   MRN: [id number]  Chief Complaint  Patient presents with  . Hypertension    2 months    HPI Patient is in today for follow up. She is accompanied by he personal care aid. She is feeling well today. Her leg pain has improved. She denies any recent fall or injury. She notes some pedal edema but it is b/l. She notes fatigue but no recent febrile illness or hospitalizations. Denies CP/palp/SOB/HA/congestion/fevers/GI or GU c/o. Taking meds as prescribed  Past Medical History:  Diagnosis Date  . Acute renal insufficiency 12/24/2013  . Amaurosis fugax   . Arthritis 12/06/2012  . Atherosclerosis   . CAD (coronary artery disease)    a. s/p CABG x 4 (LIMA->LAD, VG->Diag, VG->OM1->OM3);  b. 2010 Cath: 3/4 patent grafts (VG->diag occluded), 3vd, sev PVD ->Med Rx;  c. 12/2011 Lexi MV: sm area of mild mid and apical ant wall ischemia ->med rx.  . Carotid bruit   . Chronic kidney disease (CKD) stage G4/A1, severely decreased glomerular filtration rate (GFR) between 15-29 mL/min/1.73 square meter and albuminuria creatinine ratio less than 30 mg/g (HCC)    GFR 25 on 12/25/13  . CVA (cerebral infarction)   . Dementia    pt denies  . Depression 12/24/2013  . Diabetes mellitus type II    insulin dependent.   . Diabetic peripheral neuropathy ([place])   . Diabetic retinopathy   . Diverticulosis   . Duodenal ulcer 2009 and 11/2011   caused GI bleeding  . Gallstones 2009   seen on CT scan  . GI bleed 04/2011, 11/2011   found non-bleeding duodenal ulcers  . Hyperlipidemia   . Hypersomnolence 03/12/2013  . Hypertension   . Iron deficiency anemia secondary to blood loss (chronic)   . Melena 1015/2015   PEPTIC ULCER    REQUIRING TRANSFUSION  . Pedal edema 10/12/2016  . PVD (peripheral vascular disease) ([place])   . Rib fracture 04/21/2015  . Thiamine deficiency 08/13/2015  . UTI (urinary tract infection) 04/08/2014  . Weight  loss, non-intentional 01/29/2014    Past Surgical History:  Procedure Laterality Date  . ABDOMINAL HYSTERECTOMY    . COLONOSCOPY  05/20/2011   Procedure: COLONOSCOPY;  Surgeon: Scarlette Shorts, MD;  Location: [place];  Service: Endoscopy;  Laterality: N/A;  . COLONOSCOPY WITH PROPOFOL N/A 06/09/2017   Procedure: COLONOSCOPY WITH PROPOFOL;  Surgeon: Milus Banister, MD;  Location: [facility]COPY;  Service: Endoscopy;  Laterality: N/A;  . CORONARY ARTERY BYPASS GRAFT  1999   x 4  . ENTEROSCOPY N/A 12/29/2013   Procedure: ENTEROSCOPY;  Surgeon: Inda Castle, MD;  Location: [place];  Service: Endoscopy;  Laterality: N/A;  . ESOPHAGOGASTRODUODENOSCOPY  05/16/2011   Procedure: ESOPHAGOGASTRODUODENOSCOPY (EGD);  Surgeon: Gatha Mayer, MD;  Location[facility] END[facility]COPY;  Service: Endoscopy;  Laterality: N/A;  . ESOPHAGOGASTRODUODENOSCOPY  12/08/2011   Procedure: ESOPHAGOGASTRODUODENOSCOPY (EGD);  Surgeon: Ladene Artist, MD,FACG;  [facility]: [facility] ENDOSCOPY;  Service: Endoscopy;  Laterality: N/A;  . ESOPHAGOGASTRODUODENOSCOPY (EGD) WITH PROPOFOL N/A 06/08/2017   Procedure: ESOPHAGOGASTRODUODENOSCOPY (EGD) WITH PROPOFOL;  Surgeon: Milus Banister, MD;  Location: [facility] ENDOSCOPY;  Service: Endoscopy;  Laterality: N/A;  . UPPER GASTROINTESTINAL ENDOSCOPY  02/22/2008   w/biopsy, duedenal ulcer, antrum erosions    Family History  Problem Relation Age of Onset  . Coronary artery disease Father   . Hypertension Father   . Stroke Father   .  Cancer Mother        ?  . Diabetes Maternal Uncle   . Stroke Sister   . Colon cancer Neg Hx     Social History   Socioeconomic History  . Marital status: Widowed    Spouse name: Not on file  . Number of children: 4  . Years of education: Not on file  . Highest education level: Not on file  Occupational History  . Occupation: retired    Fish farm manager: RETIRED  Social Needs  . Financial resource strain: Not on file  . Food insecurity:    Worry: Not on file     Inability: Not on file  . Transportation needs:    Medical: Not on file    Non-medical: Not on file  Tobacco Use  . Smoking status: Former Smoker    Types: Cigarettes    Last attempt to quit: 12/31/2000    Years since quitting: 16.7  . Smokeless tobacco: Never Used  Substance and Sexual Activity  . Alcohol use: No  . Drug use: No  . Sexual activity: Not on file  Lifestyle  . Physical activity:    Days per week: Not on file    Minutes per session: Not on file  . Stress: Not on file  Relationships  . Social connections:    Talks on phone: Not on file    Gets together: Not on file    Attends religious service: Not on file    Active member of club or organization: Not on file    Attends meetings of clubs or organizations: Not on file    Relationship status: Not on file  . Intimate partner violence:    Fear of current or ex partner: Not on file    Emotionally abused: Not on file    Physically abused: Not on file    Forced sexual activity: Not on file  Other Topics Concern  . Not on file  Social History Narrative   Widowed - husband passed away in 05-24-2009   lives with daughter with epilepsy - mental retardation   Retired     Former Smoker      Alcohol use-no          Occupation: Retired       son - Waynesha Rammel     Outpatient Medications Prior to Visit  Medication Sig Dispense Refill  . albuterol (PROAIR HFA) 108 (90 Base) MCG/ACT inhaler Inhale 2 puffs into the lungs every 6 (six) hours as needed for wheezing or shortness of breath. 18 g 2  . amLODipine (NORVASC) 5 MG tablet Take 1 tablet (5 mg total) by mouth daily. 90 tablet 1  . atorvastatin (LIPITOR) 20 MG tablet Take 1 tablet (20 mg total) by mouth daily. 90 tablet 1  . blood glucose meter kit and supplies KIT Dispense based on patient and insurance preference. Use up to four times daily as directed. Dx: E11.9, Z79.4 1 each 0  . Cholecalciferol (VITAMIN D) 1000 UNITS capsule Take 1,000 Units by mouth daily.     .  citalopram (CELEXA) 20 MG tablet Take 1 tablet (20 mg total) by mouth daily. 90 tablet 1  . ferrous sulfate 325 (65 FE) MG tablet Take 325 mg daily with breakfast by mouth.    . insulin glargine (LANTUS) 100 UNIT/ML injection Inject 70m [place] at bedtime 1500 mL 1  . insulin lispro (HUMALOG KWIKPEN) 100 UNIT/ML KiwkPen Inject 0.05-0.1 mLs (5-10 Units total) into the skin 3 (three)  times daily with meals. 15 mL 4  . ipratropium (ATROVENT HFA) 17 MCG/ACT inhaler Inhale 2 puffs into the lungs every 6 (six) hours as needed for wheezing. 1 Inhaler 12  . ipratropium-albuterol (DUONEB) 0.5-2.5 (3) MG/3ML SOLN Take 3 mLs by nebulization every 6 (six) hours as needed. 360 mL 1  . isosorbide mononitrate (IMDUR) 30 MG 24 hr tablet Take 1 tablet (30 mg total) by mouth at bedtime. 90 tablet 1  . nitroGLYCERIN (NITROSTAT) 0.4 MG SL tablet Place 0.4 mg under the tongue every 5 (five) minutes as needed for chest pain.    . Omega-3 Fatty Acids (FISH OIL) 1200 MG CAPS Take 1,200 mg by mouth daily.     . pantoprazole (PROTONIX) 40 MG tablet Take 1 tablet (40 mg total) by mouth daily. 90 tablet 0  . thiamine (VITAMIN B-1) 100 MG tablet Take 1 tablet (100 mg total) by mouth daily. (Patient taking differently: Take 100 mg See admin instructions by mouth. Monday, Wednesday and Friday) 30 tablet 5  . warfarin (COUMADIN) 5 MG tablet Take as directed by [facility]madin Clinic 30 tablet 1   No facility-administered medications prior to visit.     No Known Allergies  Review of Systems  Constitutional: Positive for malaise/fatigue. Negative for fever.  HENT: Negative for congestion.   Eyes: Negative for blurred vision.  Respiratory: Negative for shortness of breath.   Cardiovascular: Positive for leg swelling. Negative for chest pain and palpitations.  Gastrointestinal: Negative for abdominal pain, blood in stool and nausea.  Genitourinary: Negative for dysuria and frequency.  Musculoskeletal: Negative for falls.  Skin:  Negative for rash.  Neurological: Negative for dizziness, loss of consciousness and headaches.  Endo/Heme/Allergies: Negative for environmental allergies.  Psychiatric/Behavioral: Negative for depression. The patient is not nervous/anxious.        Objective:    Physical Exam  Constitutional: She is oriented to person, place, and time. She appears well-developed and well-nourished. No distress.  HENT:  Head: Normocephalic and atraumatic.  Nose: Nose normal.  Eyes: Right eye exhibits no discharge. Left eye exhibits no discharge.  Neck: Normal range of motion. Neck supple.  Cardiovascular: Normal rate and regular rhythm.  Pulmonary/Chest: Effort normal and breath sounds normal.  Abdominal: Soft. Bowel sounds are normal. There is no tenderness.  Musculoskeletal: She exhibits edema.  Neurological: She is alert and oriented to person, place, and time.  Skin: Skin is warm and dry.  Psychiatric: She has a normal mood and affect.  Nursing note and vitals reviewed.   BP 128/78   Pulse (!) 101   Temp 97.9 F (36.6 C) (Oral)   Resp 16   Ht _0  (1.702 m)   Wt 161 lb 12.8 oz (73.4 kg)   SpO2 98%   BMI 25.34 kg/m  Wt Readings from Last 3 Encounters:  09/28/17 161 lb 12.8 oz (73.4 kg)  07/30/17 165 lb 12.8 oz (75.2 kg)  07/09/17 161 lb (73 kg)     Lab Results  Component Value Date   WBC 7.5 09/28/2017   HGB 12.7 09/28/2017   HCT 37.4 09/28/2017   PLT 192.0 09/28/2017   GLUCOSE 212 (H) 09/28/2017   CHOL 182 09/28/2017   TRIG 196.0 (H) 09/28/2017   HDL 42.70 09/28/2017   LDLDIRECT 128.0 01/26/2017   LDLCALC 100 (H) 09/28/2017   ALT 14 09/28/2017   AST 12 09/28/2017   NA 141 09/28/2017   K 4.1 09/28/2017   CL 105 09/28/2017   CREATININE 2.02 (H) 09/28/2017  BUN 38 (H) 09/28/2017   CO2 29 09/28/2017   TSH 1.87 09/28/2017   INR 2.3 09/29/2017   HGBA1C 7.5 (H) 09/28/2017   MICROALBUR 97.4 (H) 11/26/2015    Lab Results  Component Value Date   TSH 1.87 09/28/2017    Lab Results  Component Value Date   WBC 7.5 09/28/2017   HGB 12.7 09/28/2017   HCT 37.4 09/28/2017   MCV 91.2 09/28/2017   PLT 192.0 09/28/2017   Lab Results  Component Value Date   NA 141 09/28/2017   K 4.1 09/28/2017   CO2 29 09/28/2017   GLUCOSE 212 (H) 09/28/2017   BUN 38 (H) 09/28/2017   CREATININE 2.02 (H) 09/28/2017   BILITOT 0.9 09/28/2017   ALKPHOS 71 09/28/2017   AST 12 09/28/2017   ALT 14 09/28/2017   PROT 6.8 09/28/2017   ALBUMIN 3.9 09/28/2017   CALCIUM 9.0 09/28/2017   ANIONGAP 10 06/10/2017   GFR 24.91 (L) 09/28/2017   Lab Results  Component Value Date   CHOL 182 09/28/2017   Lab Results  Component Value Date   HDL 42.70 09/28/2017   Lab Results  Component Value Date   LDLCALC 100 (H) 09/28/2017   Lab Results  Component Value Date   TRIG 196.0 (H) 09/28/2017   Lab Results  Component Value Date   CHOLHDL 4 09/28/2017   Lab Results  Component Value Date   HGBA1C 7.5 (H) 09/28/2017       Assessment & Plan:   Problem List Items Addressed This Visit    Essential hypertension - Primary (Chronic)    Well controlled, no changes to meds. Encouraged heart healthy diet such as the DASH diet and exercise as tolerated. impoved on recheck      Relevant Orders   CBC (Completed)   Comprehensive metabolic panel (Completed)   TSH (Completed)   Diabetes mellitus type 2, insulin dependent (HCC) (Chronic)   Relevant Orders   Hemoglobin A1c (Completed)   CKD (chronic kidney disease) stage 4, GFR 15-29 ml/min (HCC) (Chronic)   History of CVA (cerebrovascular accident) (Chronic)   Type 2 diabetes mellitus with diabetic neuropathy, unspecified (HCC)    hgba1c acceptable, minimize simple carbs. Increase exercise as tolerated. Continue current meds      Hyperlipidemia    Tolerating statin, encouraged heart healthy diet, avoid trans fats, minimize simple carbs and saturated fats. Increase exercise as tolerated      Relevant Orders   Lipid panel  (Completed)   B12 deficiency   Relevant Orders   Vitamin B12 (Completed)   Anemia   Thiamine deficiency    Over treated will stop supplementaion and monitor      Relevant Orders   Vitamin B1 (Completed)   Pedal edema    Elevate feet. Minimize sodium, use compression hose and only use diuretics sparingly      DVT (deep venous thrombosis) (HCC)    Resolved. Have confirmed with cardiology OK to d/c coumadin. Will start ECASA 81 mg tabs daily      Atrial fibrillation with RVR ([place])    Follows with [facility] and follows with their coumadin clinic, they have agreed it is appropriate to stop the Coumadin and start on ECASA 81 mg daily         I have discontinued Petrea F. Dart's warfarin. I am also having her maintain her Vitamin D, Fish Oil, nitroGLYCERIN, thiamine, insulin lispro, ferrous sulfate, atorvastatin, citalopram, ipratropium, albuterol, ipratropium-albuterol, pantoprazole, isosorbide mononitrate, amLODipine, insulin glargine, and blood glucose  meter kit and supplies.  Meds ordered this encounter  Medications  . DISCONTD: furosemide (LASIX) 20 MG tablet    Sig: Take 1 tablet (20 mg total) by mouth daily as needed. Edema or weight >3#/24 hours    Dispense:  30 tablet    Refill:  3  . DISCONTD: warfarin (COUMADIN) 5 MG tablet    Sig: Take as directed by Coumadin Clinic    Dispense:  30 tablet    Refill:  1    30 day supply      Penni Homans, MD

## 2017-10-03 NOTE — Assessment & Plan Note (Signed)
Resolved. Have confirmed with cardiology OK to d/c coumadin. Will start ECASA 81 mg tabs daily

## 2017-10-03 NOTE — Assessment & Plan Note (Signed)
Elevate feet. Minimize sodium, use compression hose and only use diuretics sparingly

## 2017-10-04 ENCOUNTER — Telehealth: Payer: Self-pay | Admitting: Medical

## 2017-10-04 ENCOUNTER — Ambulatory Visit (INDEPENDENT_AMBULATORY_CARE_PROVIDER_SITE_OTHER): Payer: Medicare Other | Admitting: Medical

## 2017-10-04 ENCOUNTER — Encounter: Payer: Self-pay | Admitting: Medical

## 2017-10-04 VITALS — BP 153/59 | HR 56 | Temp 98.0°F | Resp 16 | Ht 67.0 in | Wt 158.6 lb

## 2017-10-04 DIAGNOSIS — Z87440 Personal history of urinary (tract) infections: Secondary | ICD-10-CM | POA: Diagnosis not present

## 2017-10-04 DIAGNOSIS — R5383 Other fatigue: Secondary | ICD-10-CM | POA: Diagnosis not present

## 2017-10-04 NOTE — Progress Notes (Addendum)
Subjective:    Patient ID: Destiny [name], female    DOB: [birth year], 84 y.o.   MRN: [id number]  HPI   Pt in with some weakness that seemed to start on Friday and worsened over the weekend. But today she seems back to normal. Almost back to baseline. No fever, no chills, sweats or cough. Son does not have confidence in his thermometer. Hx of occasional uti in the past.  Pt Sunday morning was using bathroom and lost balance landed on her forearm. Got abrasion to area.    Review of Systems  Constitutional: Negative for chills and fatigue.  Respiratory: Negative for cough, chest tightness, shortness of breath and wheezing.   Cardiovascular: Negative for chest pain and palpitations.  Gastrointestinal: Negative for anal bleeding.  Musculoskeletal: Negative for arthralgias, back pain, joint swelling, myalgias and neck stiffness.       She thinks maybe low back pain.  Skin: Negative for rash.  Hematological: Negative for adenopathy. Does not bruise/bleed easily.  Psychiatric/Behavioral: Negative for behavioral problems and confusion.    Past Medical History:  Diagnosis Date  . Acute renal insufficiency 12/24/2013  . Amaurosis fugax   . Arthritis 12/06/2012  . Atherosclerosis   . CAD (coronary artery disease)    a. s/p CABG x 4 (LIMA->LAD, VG->Diag, VG->OM1->OM3);  b. 2010 Cath: 3/4 patent grafts (VG->diag occluded), 3vd, sev PVD ->Med Rx;  c. 12/2011 Lexi MV: sm area of mild mid and apical ant wall ischemia ->med rx.  . Carotid bruit   . Chronic kidney disease (CKD) stage G4/A1, severely decreased glomerular filtration rate (GFR) between 15-29 mL/min/1.73 square meter and albuminuria creatinine ratio less than 30 mg/g (HCC)    GFR 25 on 12/25/13  . CVA (cerebral infarction)   . Dementia    pt denies  . Depression 12/24/2013  . Diabetes mellitus type II    insulin dependent.   . Diabetic peripheral neuropathy ([place])   . Diabetic retinopathy   . Diverticulosis   . Duodenal ulcer  2009 and 11/2011   caused GI bleeding  . Gallstones 2009   seen on CT scan  . GI bleed 04/2011, 11/2011   found non-bleeding duodenal ulcers  . Hyperlipidemia   . Hypersomnolence 03/12/2013  . Hypertension   . Iron deficiency anemia secondary to blood loss (chronic)   . Melena 1015/2015   PEPTIC ULCER    REQUIRING TRANSFUSION  . Pedal edema 10/12/2016  . PVD (peripheral vascular disease) ([place])   . Rib fracture 04/21/2015  . Thiamine deficiency 08/13/2015  . UTI (urinary tract infection) 04/08/2014  . Weight loss, non-intentional 01/29/2014     Social History   Socioeconomic History  . Marital status: Widowed    Spouse name: Not on file  . Number of children: 4  . Years of education: Not on file  . Highest education level: Not on file  Occupational History  . Occupation: retired    Fish farm manager: RETIRED  Social Needs  . Financial resource strain: Not on file  . Food insecurity:    Worry: Not on file    Inability: Not on file  . Transportation needs:    Medical: Not on file    Non-medical: Not on file  Tobacco Use  . Smoking status: Former Smoker    Types: Cigarettes    Last attempt to quit: 12/31/2000    Years since quitting: 16.7  . Smokeless tobacco: Never Used  Substance and Sexual Activity  . Alcohol use: No  .  Drug use: No  . Sexual activity: Not on file  Lifestyle  . Physical activity:    Days per week: Not on file    Minutes per session: Not on file  . Stress: Not on file  Relationships  . Social connections:    Talks on phone: Not on file    Gets together: Not on file    Attends religious service: Not on file    Active member of club or organization: Not on file    Attends meetings of clubs or organizations: Not on file    Relationship status: Not on file  . Intimate partner violence:    Fear of current or ex partner: Not on file    Emotionally abused: Not on file    Physically abused: Not on file    Forced sexual activity: Not on file  Other Topics  Concern  . Not on file  Social History Narrative   Widowed - husband passed away in 2009/05/15   lives with daughter with epilepsy - mental retardation   Retired     Former Smoker      Alcohol use-no          Occupation: Retired       son - Destiny [name]     Past Surgical History:  Procedure Laterality Date  . ABDOMINAL HYSTERECTOMY    . COLONOSCOPY  05/20/2011   Procedure: COLONOSCOPY;  Surgeon: Scarlette Shorts, MD;  Location: [place];  Service: Endoscopy;  Laterality: N/A;  . COLONOSCOPY WITH PROPOFOL N/A 06/09/2017   Procedure: COLONOSCOPY WITH PROPOFOL;  Surgeon: Milus Banister, MD;  Location: [facility] ENDOSCOPY;  Service: Endoscopy;  Laterality: N/A;  . CORONARY ARTERY BYPASS GRAFT  1999   x 4  . ENTEROSCOPY N/A 12/29/2013   Procedure: ENTEROSCOPY;  Surgeon: Inda Castle, MD;  Location: [place];  Service: Endoscopy;  Laterality: N/A;  . ESOPHAGOGASTRODUODENOSCOPY  05/16/2011   Procedure: ESOPHAGOGASTRODUODENOSCOPY (EGD);  Surgeon: Gatha Mayer, MD;  Location[facility] ENDOSCOPY;  Service: Endoscopy;  Laterality: N/A;  . ESOPHAGOGASTRODUODENOSCOPY  12/08/2011   Procedure: ESOPHAGOGASTRODUODENOSCOPY (EGD);  Surgeon: Ladene Artist, MD,FACG;  Location: [facility] ENDOSCOPY;  Service: Endoscopy;  Laterality: N/A;  . ESOPHAGOGASTRODUODENOSCOPY (EGD) WITH PROPOFOL N/A 06/08/2017   Procedure: ESOPHAGOGASTRODUODENOSCOPY (EGD) WITH PROPOFOL;  Surgeon: Milus Banister, MD;  Location: [facility];  Service: Endoscopy;  Laterality: N/A;  . UPPER GASTROINTESTINAL ENDOSCOPY  02/22/2008   w/biopsy, duedenal ulcer, antrum erosions    Family History  Problem Relation Age of Onset  . Coronary artery disease Father   . Hypertension Father   . Stroke Father   . Cancer Mother        ?  . Diabetes Maternal Uncle   . Stroke Sister   . Colon cancer Neg Hx     No Known Allergies  Current Outpatient Medications on File Prior to Visit  Medication Sig Dispense Refill  . albuterol (PROAIR HFA) 108 (90 Base)  MCG/ACT inhaler Inhale 2 puffs into the lungs every 6 (six) hours as needed for wheezing or shortness of breath. 18 g 2  . amLODipine (NORVASC) 5 MG tablet Take 1 tablet (5 mg total) by mouth daily. 90 tablet 1  . aspirin EC 81 MG tablet Take 1 tablet (81 mg total) by mouth daily.    Mar[place] Kitchen atorvastatin (LIPITOR) 20 MG tablet Take 1 tablet (20 mg total) by mouth daily. 90 tablet 1  . blood glucose meter kit and supplies KIT Dispense based on patient  and insurance preference. Use up to four times daily as directed. Dx: E11.9, Z79.4 1 each 0  . Cholecalciferol (VITAMIN D) 1000 UNITS capsule Take 1,000 Units by mouth daily.     . citalopram (CELEXA) 20 MG tablet Take 1 tablet (20 mg total) by mouth daily. 90 tablet 1  . ferrous sulfate 325 (65 FE) MG tablet Take 325 mg daily with breakfast by mouth.    . furosemide (LASIX) 20 MG tablet Take 1 tablet (20 mg total) by mouth daily as needed. Edema or weight >3#/24 hours 30 tablet 3  . insulin glargine (LANTUS) 100 UNIT/ML injection Inject 5m [place] at bedtime 1500 mL 1  . insulin lispro (HUMALOG KWIKPEN) 100 UNIT/ML KiwkPen Inject 0.05-0.1 mLs (5-10 Units total) into the skin 3 (three) times daily with meals. 15 mL 4  . ipratropium (ATROVENT HFA) 17 MCG/ACT inhaler Inhale 2 puffs into the lungs every 6 (six) hours as needed for wheezing. 1 Inhaler 12  . ipratropium-albuterol (DUONEB) 0.5-2.5 (3) MG/3ML SOLN Take 3 mLs by nebulization every 6 (six) hours as needed. 360 mL 1  . isosorbide mononitrate (IMDUR) 30 MG 24 hr tablet Take 1 tablet (30 mg total) by mouth at bedtime. 90 tablet 1  . nitroGLYCERIN (NITROSTAT) 0.4 MG SL tablet Place 0.4 mg under the tongue every 5 (five) minutes as needed for chest pain.    . Omega-3 Fatty Acids (FISH OIL) 1200 MG CAPS Take 1,200 mg by mouth daily.     . pantoprazole (PROTONIX) 40 MG tablet Take 1 tablet (40 mg total) by mouth daily. 90 tablet 0  . thiamine (VITAMIN B-1) 100 MG tablet Take 1 tablet (100 mg total) by mouth  daily. (Patient taking differently: Take 100 mg See admin instructions by mouth. Monday, Wednesday and Friday) 30 tablet 5   No current facility-administered medications on file prior to visit.     BP (!) 153/59   Pulse (!) 56   Temp 98 F (36.7 C) (Oral)   Resp 16   Ht _0  (1.702 m)   Wt 158 lb 9.6 oz (71.9 kg)   SpO2 99%   BMI 24.84 kg/m       Objective:   Physical Exam  General- No acute distress. Pleasant patient.(appears energetic today. Much better today compared to yesterday per son) Neck- Full range of motion, no jvd Lungs- Clear, even and unlabored. Heart- regular rate and rhythm. Neurologic- CNII- XII grossly intact. Back- no obvious cva pain. She thinks faint pain on both sides. Neuro- CN III- XII grossly intact. Abdomen- soft, non-tender, non-distended, +bs, no rebound or guarding. No organomegaly.        Assessment & Plan:  For recent fatigue will get cmp today. Cbc looked good the other day but will repeat today for caution sake. Pt on lasix so will repeat cmp to see if dehydrated.(note baseline low gfr and increased CR)  Also hx of uti so will get urine culture.   Considered giving keflex but did not give today as she could not leave a sample and we did not do culture  For abrasion topical antibiotic twice daily.   Follow up in 7 days or as needed  So I am going to ask [facility] call patient and see how pt is doing and if they were able to come back in and give urine sample.

## 2017-10-04 NOTE — Telephone Encounter (Signed)
Will you call pt family and see if they were able to get urine sample. Please bring it in so we can get urine poct and send out for culture. Call tomorrow please.

## 2017-10-04 NOTE — Patient Instructions (Addendum)
For recent fatigue will get cmp today. Cbc looked good the other day but will repeat today for caution sake. Pt on lasix so will repeat cmp to see if dehydrated.(did stress to make sure she is hydrated well. Recommended propel or sugar free gatorade)  Also hx of uti so will get urine culture.   Considered giving keflex but did not give today as she could not leave a sample and we did not do culture(they left with cup but appears never gave sample today)  For abrasion topical antibiotic twice daily.   Follow up in 7 days or as needed  So I am going to ask [place] to call patient and see how pt is doing and if they were able to come back in and give urine sample.

## 2017-10-05 LAB — CBC WITH DIFFERENTIAL/PLATELET
Basophils Absolute: 0.1 10*3/uL (ref 0.0–0.1)
Basophils Relative: 0.8 % (ref 0.0–3.0)
EOS PCT: 1 % (ref 0.0–5.0)
Eosinophils Absolute: 0.1 10*3/uL (ref 0.0–0.7)
HEMATOCRIT: 34.5 % — AB (ref 36.0–46.0)
HEMOGLOBIN: 11.8 g/dL — AB (ref 12.0–15.0)
Lymphocytes Relative: 17.1 % (ref 12.0–46.0)
Lymphs Abs: 1.8 10*3/uL (ref 0.7–4.0)
MCHC: 34.3 g/dL (ref 30.0–36.0)
MCV: 91 fl (ref 78.0–100.0)
MONOS PCT: 10.3 % (ref 3.0–12.0)
Monocytes Absolute: 1.1 10*3/uL — ABNORMAL HIGH (ref 0.1–1.0)
Neutro Abs: 7.7 10*3/uL (ref 1.4–7.7)
Neutrophils Relative %: 70.8 % (ref 43.0–77.0)
Platelets: 168 10*3/uL (ref 150.0–400.0)
RBC: 3.79 Mil/uL — ABNORMAL LOW (ref 3.87–5.11)
RDW: 14 % (ref 11.5–15.5)
WBC: 10.8 10*3/uL — ABNORMAL HIGH (ref 4.0–10.5)

## 2017-10-05 LAB — COMPREHENSIVE METABOLIC PANEL
ALBUMIN: 3.6 g/dL (ref 3.5–5.2)
ALT: 14 U/L (ref 0–35)
AST: 17 U/L (ref 0–37)
Alkaline Phosphatase: 60 U/L (ref 39–117)
BUN: 39 mg/dL — ABNORMAL HIGH (ref 6–23)
CO2: 27 mEq/L (ref 19–32)
Calcium: 8.7 mg/dL (ref 8.4–10.5)
Chloride: 99 mEq/L (ref 96–112)
Creatinine, Ser: 2.49 mg/dL — ABNORMAL HIGH (ref 0.40–1.20)
GFR: 19.57 mL/min — AB (ref >60.00)
Glucose, Bld: 166 mg/dL — ABNORMAL HIGH (ref 70–99)
POTASSIUM: 3.8 meq/L (ref 3.5–5.1)
Sodium: 136 mEq/L (ref 135–145)
TOTAL PROTEIN: 6.5 g/dL (ref 6.0–8.3)
Total Bilirubin: 1.2 mg/dL (ref 0.2–1.2)

## 2017-10-05 MED ORDER — CEFDINIR 300 MG PO CAPS: 300.0000 mg | ORAL_CAPSULE | Freq: Two times a day (BID) | ORAL | 0 refills | Status: DC

## 2017-10-05 NOTE — Addendum Note (Signed)
Addended by: Magdalene Molly A on: 10/05/2017 07:15 PM   Modules accepted: Orders

## 2017-10-05 NOTE — Telephone Encounter (Signed)
Spoke with patients son he stated he did not feel she need to go to the ED because he states "she comes out worse than when she goes in, it takes too long for her to get back to herself after the hospital, and I don't think it will do anything"... I advised him of her kidney functions and the importance of her being hydrated. He still resisted.   I hung up and advise Dr. [name] of the conversation, Dr. [name] had me call the son back and advise him that his mom can have a 7 day round of antibiotics, and if not better after the course she needed to go to the ED immediately .  Called her son and advised him that Dr. [name] has sent in a 7 day supply of antibiotics and after the course if she is not better and not urinating she will need to go to the ED.   He voiced his understanding.   I sent medication to [facility] in [place]. Patients son made aware.

## 2017-10-05 NOTE — Telephone Encounter (Signed)
Please the result of cmp and cbc that I sent you and message regarding that hydration in ED would likely be safest course of action.

## 2017-10-05 NOTE — Telephone Encounter (Signed)
Spoke with son [name] he stated he was unable to get a sample last night. He would go back there today and see if she can go and he will bring sample as soon as she has given one.

## 2017-10-10 ENCOUNTER — Encounter: Payer: Self-pay | Admitting: Family Medicine

## 2017-10-11 ENCOUNTER — Encounter: Payer: Self-pay | Admitting: Medical

## 2017-10-12 ENCOUNTER — Observation Stay (HOSPITAL_BASED_OUTPATIENT_CLINIC_OR_DEPARTMENT_OTHER)
Admission: EM | Admit: 2017-10-12 | Discharge: 2017-10-13 | Disposition: A | Discharge status: Home / Self Care | Payer: Medicare Other | Attending: Family Medicine | Admitting: Family Medicine

## 2017-10-12 ENCOUNTER — Emergency Department (HOSPITAL_BASED_OUTPATIENT_CLINIC_OR_DEPARTMENT_OTHER): Payer: Medicare Other

## 2017-10-12 ENCOUNTER — Other Ambulatory Visit: Payer: Self-pay

## 2017-10-12 ENCOUNTER — Encounter (HOSPITAL_BASED_OUTPATIENT_CLINIC_OR_DEPARTMENT_OTHER): Payer: Self-pay | Admitting: Emergency Medicine

## 2017-10-12 ENCOUNTER — Ambulatory Visit (INDEPENDENT_AMBULATORY_CARE_PROVIDER_SITE_OTHER): Payer: Medicare Other | Admitting: Medical

## 2017-10-12 ENCOUNTER — Encounter: Payer: Self-pay | Admitting: Medical

## 2017-10-12 VITALS — BP 150/60 | HR 53 | Temp 97.8°F | Resp 16 | Ht 67.0 in | Wt 160.8 lb

## 2017-10-12 DIAGNOSIS — I130 Hypertensive heart and chronic kidney disease with heart failure and stage 1 through stage 4 chronic kidney disease, or unspecified chronic kidney disease: Secondary | ICD-10-CM | POA: Diagnosis not present

## 2017-10-12 DIAGNOSIS — K269 Duodenal ulcer, unspecified as acute or chronic, without hemorrhage or perforation: Secondary | ICD-10-CM | POA: Diagnosis not present

## 2017-10-12 DIAGNOSIS — Z7982 Long term (current) use of aspirin: Secondary | ICD-10-CM | POA: Insufficient documentation

## 2017-10-12 DIAGNOSIS — I420 Dilated cardiomyopathy: Secondary | ICD-10-CM | POA: Insufficient documentation

## 2017-10-12 DIAGNOSIS — E785 Hyperlipidemia, unspecified: Secondary | ICD-10-CM | POA: Diagnosis present

## 2017-10-12 DIAGNOSIS — I061 Rheumatic aortic insufficiency: Secondary | ICD-10-CM | POA: Diagnosis not present

## 2017-10-12 DIAGNOSIS — R0602 Shortness of breath: Secondary | ICD-10-CM | POA: Diagnosis present

## 2017-10-12 DIAGNOSIS — Z8249 Family history of ischemic heart disease and other diseases of the circulatory system: Secondary | ICD-10-CM | POA: Insufficient documentation

## 2017-10-12 DIAGNOSIS — M6281 Muscle weakness (generalized): Secondary | ICD-10-CM | POA: Diagnosis not present

## 2017-10-12 DIAGNOSIS — I480 Paroxysmal atrial fibrillation: Secondary | ICD-10-CM | POA: Insufficient documentation

## 2017-10-12 DIAGNOSIS — F0390 Unspecified dementia without behavioral disturbance: Secondary | ICD-10-CM | POA: Diagnosis present

## 2017-10-12 DIAGNOSIS — J449 Chronic obstructive pulmonary disease, unspecified: Secondary | ICD-10-CM | POA: Insufficient documentation

## 2017-10-12 DIAGNOSIS — R5383 Other fatigue: Secondary | ICD-10-CM | POA: Diagnosis not present

## 2017-10-12 DIAGNOSIS — Z8673 Personal history of transient ischemic attack (TIA), and cerebral infarction without residual deficits: Secondary | ICD-10-CM | POA: Insufficient documentation

## 2017-10-12 DIAGNOSIS — E860 Dehydration: Secondary | ICD-10-CM

## 2017-10-12 DIAGNOSIS — E11319 Type 2 diabetes mellitus with unspecified diabetic retinopathy without macular edema: Secondary | ICD-10-CM | POA: Insufficient documentation

## 2017-10-12 DIAGNOSIS — F329 Major depressive disorder, single episode, unspecified: Secondary | ICD-10-CM | POA: Diagnosis not present

## 2017-10-12 DIAGNOSIS — Z87891 Personal history of nicotine dependence: Secondary | ICD-10-CM | POA: Diagnosis not present

## 2017-10-12 DIAGNOSIS — Z9889 Other specified postprocedural states: Secondary | ICD-10-CM | POA: Insufficient documentation

## 2017-10-12 DIAGNOSIS — Z79899 Other long term (current) drug therapy: Secondary | ICD-10-CM | POA: Insufficient documentation

## 2017-10-12 DIAGNOSIS — R944 Abnormal results of kidney function studies: Secondary | ICD-10-CM

## 2017-10-12 DIAGNOSIS — N184 Chronic kidney disease, stage 4 (severe): Secondary | ICD-10-CM | POA: Diagnosis present

## 2017-10-12 DIAGNOSIS — I509 Heart failure, unspecified: Secondary | ICD-10-CM

## 2017-10-12 DIAGNOSIS — Z951 Presence of aortocoronary bypass graft: Secondary | ICD-10-CM | POA: Insufficient documentation

## 2017-10-12 DIAGNOSIS — R0600 Dyspnea, unspecified: Secondary | ICD-10-CM | POA: Diagnosis not present

## 2017-10-12 DIAGNOSIS — E1142 Type 2 diabetes mellitus with diabetic polyneuropathy: Secondary | ICD-10-CM | POA: Insufficient documentation

## 2017-10-12 DIAGNOSIS — R062 Wheezing: Secondary | ICD-10-CM

## 2017-10-12 DIAGNOSIS — M1990 Unspecified osteoarthritis, unspecified site: Secondary | ICD-10-CM | POA: Diagnosis not present

## 2017-10-12 DIAGNOSIS — E1122 Type 2 diabetes mellitus with diabetic chronic kidney disease: Secondary | ICD-10-CM | POA: Insufficient documentation

## 2017-10-12 DIAGNOSIS — Z87440 Personal history of urinary (tract) infections: Secondary | ICD-10-CM | POA: Diagnosis not present

## 2017-10-12 DIAGNOSIS — I110 Hypertensive heart disease with heart failure: Secondary | ICD-10-CM | POA: Diagnosis not present

## 2017-10-12 DIAGNOSIS — Z7901 Long term (current) use of anticoagulants: Secondary | ICD-10-CM

## 2017-10-12 DIAGNOSIS — Z794 Long term (current) use of insulin: Secondary | ICD-10-CM | POA: Insufficient documentation

## 2017-10-12 DIAGNOSIS — I5033 Acute on chronic diastolic (congestive) heart failure: Secondary | ICD-10-CM | POA: Diagnosis not present

## 2017-10-12 DIAGNOSIS — Z86718 Personal history of other venous thrombosis and embolism: Secondary | ICD-10-CM | POA: Insufficient documentation

## 2017-10-12 DIAGNOSIS — E119 Type 2 diabetes mellitus without complications: Secondary | ICD-10-CM

## 2017-10-12 DIAGNOSIS — Z87828 Personal history of other (healed) physical injury and trauma: Secondary | ICD-10-CM | POA: Insufficient documentation

## 2017-10-12 DIAGNOSIS — I2510 Atherosclerotic heart disease of native coronary artery without angina pectoris: Secondary | ICD-10-CM | POA: Diagnosis not present

## 2017-10-12 DIAGNOSIS — I5032 Chronic diastolic (congestive) heart failure: Secondary | ICD-10-CM | POA: Diagnosis present

## 2017-10-12 DIAGNOSIS — Z823 Family history of stroke: Secondary | ICD-10-CM | POA: Insufficient documentation

## 2017-10-12 DIAGNOSIS — R2681 Unsteadiness on feet: Secondary | ICD-10-CM | POA: Diagnosis not present

## 2017-10-12 DIAGNOSIS — Z833 Family history of diabetes mellitus: Secondary | ICD-10-CM | POA: Insufficient documentation

## 2017-10-12 DIAGNOSIS — Z90710 Acquired absence of both cervix and uterus: Secondary | ICD-10-CM | POA: Insufficient documentation

## 2017-10-12 DIAGNOSIS — Z66 Do not resuscitate: Secondary | ICD-10-CM | POA: Insufficient documentation

## 2017-10-12 HISTORY — DX: Dyspnea, unspecified: R06.00

## 2017-10-12 HISTORY — DX: Cerebral infarction, unspecified: I63.9

## 2017-10-12 LAB — COMPREHENSIVE METABOLIC PANEL
ALT: 29 U/L (ref 0–44)
ANION GAP: 11 (ref 5–15)
AST: 28 U/L (ref 15–41)
Albumin: 3.2 g/dL — ABNORMAL LOW (ref 3.5–5.0)
Alkaline Phosphatase: 75 U/L (ref 38–126)
BUN: 35 mg/dL — ABNORMAL HIGH (ref 8–23)
CO2: 22 mmol/L (ref 22–32)
Calcium: 9 mg/dL (ref 8.9–10.3)
Chloride: 104 mmol/L (ref 98–111)
Creatinine, Ser: 1.89 mg/dL — ABNORMAL HIGH (ref 0.44–1.00)
GFR, EST AFRICAN AMERICAN: 27 mL/min — AB (ref >60)
GFR, EST NON AFRICAN AMERICAN: 23 mL/min — AB (ref >60)
Glucose, Bld: 186 mg/dL — ABNORMAL HIGH (ref 70–99)
POTASSIUM: 4 mmol/L (ref 3.5–5.1)
Sodium: 137 mmol/L (ref 135–145)
Total Bilirubin: 0.8 mg/dL (ref 0.3–1.2)
Total Protein: 7.5 g/dL (ref 6.5–8.1)

## 2017-10-12 LAB — CBC WITH DIFFERENTIAL/PLATELET
BASOS ABS: 0 10*3/uL (ref 0.0–0.1)
BASOS PCT: 0 %
Eosinophils Absolute: 0.2 10*3/uL (ref 0.0–0.7)
Eosinophils Relative: 2 %
HCT: 35.3 % — ABNORMAL LOW (ref 36.0–46.0)
Hemoglobin: 12.2 g/dL (ref 12.0–15.0)
LYMPHS PCT: 19 %
Lymphs Abs: 2 10*3/uL (ref 0.7–4.0)
MCH: 30.9 pg (ref 26.0–34.0)
MCHC: 34.6 g/dL (ref 30.0–36.0)
MCV: 89.4 fL (ref 78.0–100.0)
MONO ABS: 1.2 10*3/uL — AB (ref 0.1–1.0)
Monocytes Relative: 11 %
Neutro Abs: 7.4 10*3/uL (ref 1.7–7.7)
Neutrophils Relative %: 68 %
Platelets: 303 10*3/uL (ref 150–400)
RBC: 3.95 MIL/uL (ref 3.87–5.11)
RDW: 13 % (ref 11.5–15.5)
WBC: 10.9 10*3/uL — AB (ref 4.0–10.5)

## 2017-10-12 LAB — GLUCOSE, CAPILLARY
Glucose-Capillary: 111 mg/dL — ABNORMAL HIGH (ref 70–99)
Glucose-Capillary: 152 mg/dL — ABNORMAL HIGH (ref 70–99)

## 2017-10-12 LAB — TROPONIN I

## 2017-10-12 LAB — BRAIN NATRIURETIC PEPTIDE: B NATRIURETIC PEPTIDE 5: 510.8 pg/mL — AB (ref 0.0–100.0)

## 2017-10-12 MED ORDER — CITALOPRAM HYDROBROMIDE 20 MG PO TABS
20.0000 mg | ORAL_TABLET | Freq: Every day | ORAL | Status: DC
  Administered 2017-10-13: 20 mg via ORAL
  Filled 2017-10-12: qty 1

## 2017-10-12 MED ORDER — VITAMIN B-1 100 MG PO TABS
100.0000 mg | ORAL_TABLET | Freq: Every day | ORAL | Status: DC
  Administered 2017-10-13: 100 mg via ORAL
  Filled 2017-10-12: qty 1

## 2017-10-12 MED ORDER — ASPIRIN EC 81 MG PO TBEC
81.0000 mg | DELAYED_RELEASE_TABLET | Freq: Every day | ORAL | Status: DC
  Administered 2017-10-13: 81 mg via ORAL
  Filled 2017-10-12: qty 1

## 2017-10-12 MED ORDER — ATORVASTATIN CALCIUM 20 MG PO TABS
20.0000 mg | ORAL_TABLET | Freq: Every day | ORAL | Status: DC
  Administered 2017-10-13: 20 mg via ORAL
  Filled 2017-10-12 (×2): qty 1

## 2017-10-12 MED ORDER — SODIUM CHLORIDE 0.9% FLUSH
3.0000 mL | INTRAVENOUS | Status: DC | PRN
  Administered 2017-10-12: 3 mL via INTRAVENOUS
  Filled 2017-10-12: qty 3

## 2017-10-12 MED ORDER — FUROSEMIDE 10 MG/ML IJ SOLN
20.0000 mg | Freq: Once | INTRAMUSCULAR | Status: AC
Start: 2017-10-12 — End: 2017-10-12
  Administered 2017-10-12: 20 mg via INTRAVENOUS
  Filled 2017-10-12: qty 2

## 2017-10-12 MED ORDER — ENOXAPARIN SODIUM 30 MG/0.3ML ~~LOC~~ SOLN: 30.0000 mg | SUBCUTANEOUS | Status: DC

## 2017-10-12 MED ORDER — IPRATROPIUM-ALBUTEROL 0.5-2.5 (3) MG/3ML IN SOLN: 3.0000 mL | RESPIRATORY_TRACT | Status: DC | PRN

## 2017-10-12 MED ORDER — ACETAMINOPHEN 325 MG PO TABS: 650.0000 mg | ORAL_TABLET | ORAL | Status: DC | PRN

## 2017-10-12 MED ORDER — SODIUM CHLORIDE 0.9 % IV SOLN
2.0000 g | INTRAVENOUS | Status: DC
  Administered 2017-10-12: 2 g via INTRAVENOUS
  Filled 2017-10-12: qty 20

## 2017-10-12 MED ORDER — IPRATROPIUM-ALBUTEROL 0.5-2.5 (3) MG/3ML IN SOLN
3.0000 mL | Freq: Four times a day (QID) | RESPIRATORY_TRACT | Status: DC
  Administered 2017-10-12: 3 mL via RESPIRATORY_TRACT
  Filled 2017-10-12: qty 3

## 2017-10-12 MED ORDER — ISOSORBIDE MONONITRATE ER 30 MG PO TB24
30.0000 mg | ORAL_TABLET | Freq: Every day | ORAL | Status: DC
  Filled 2017-10-12: qty 1

## 2017-10-12 MED ORDER — SODIUM CHLORIDE 0.9 % IV SOLN
INTRAVENOUS | Status: DC | PRN
  Administered 2017-10-12: 13:00:00 via INTRAVENOUS

## 2017-10-12 MED ORDER — PANTOPRAZOLE SODIUM 40 MG PO TBEC
40.0000 mg | DELAYED_RELEASE_TABLET | Freq: Every day | ORAL | Status: DC
  Administered 2017-10-13: 40 mg via ORAL
  Filled 2017-10-12: qty 1

## 2017-10-12 MED ORDER — SODIUM CHLORIDE 0.9 % IV SOLN
500.0000 mg | INTRAVENOUS | Status: DC
  Filled 2017-10-12: qty 500

## 2017-10-12 MED ORDER — FUROSEMIDE 10 MG/ML IJ SOLN: 20.0000 mg | Freq: Every day | INTRAMUSCULAR | Status: DC

## 2017-10-12 MED ORDER — SODIUM CHLORIDE 0.9% FLUSH
3.0000 mL | Freq: Two times a day (BID) | INTRAVENOUS | Status: DC
  Administered 2017-10-12 – 2017-10-13 (×2): 3 mL via INTRAVENOUS

## 2017-10-12 MED ORDER — ENOXAPARIN SODIUM 30 MG/0.3ML ~~LOC~~ SOLN
30.0000 mg | SUBCUTANEOUS | Status: DC
  Administered 2017-10-12 – 2017-10-13 (×2): 30 mg via SUBCUTANEOUS
  Filled 2017-10-12 (×2): qty 0.3

## 2017-10-12 MED ORDER — ONDANSETRON HCL 4 MG/2ML IJ SOLN: 4.0000 mg | Freq: Four times a day (QID) | INTRAMUSCULAR | Status: DC | PRN

## 2017-10-12 MED ORDER — INSULIN GLARGINE 100 UNIT/ML ~~LOC~~ SOLN
35.0000 [IU] | Freq: Every day | SUBCUTANEOUS | Status: DC
  Administered 2017-10-13: 35 [IU] via SUBCUTANEOUS
  Filled 2017-10-12: qty 0.35

## 2017-10-12 MED ORDER — INSULIN ASPART 100 UNIT/ML ~~LOC~~ SOLN
0.0000 [IU] | Freq: Three times a day (TID) | SUBCUTANEOUS | Status: DC
  Administered 2017-10-13: 2 [IU] via SUBCUTANEOUS
  Administered 2017-10-13: 1 [IU] via SUBCUTANEOUS

## 2017-10-12 MED ORDER — FUROSEMIDE 20 MG PO TABS
20.0000 mg | ORAL_TABLET | Freq: Two times a day (BID) | ORAL | Status: DC
  Administered 2017-10-12 – 2017-10-13 (×3): 20 mg via ORAL
  Filled 2017-10-12 (×3): qty 1

## 2017-10-12 MED ORDER — SODIUM CHLORIDE 0.9 % IV SOLN: 250.0000 mL | INTRAVENOUS | Status: DC | PRN

## 2017-10-12 NOTE — ED Provider Notes (Signed)
Emergency Department Provider Note   I have reviewed the triage vital signs and the nursing notes.   HISTORY  Chief Complaint Shortness of Breath   HPI Destiny [name] is a 84 y.o. female with PMH of CKD, CAD, Dementia, DM, HTN, and prior CVA presents to the emergency department for evaluation of progressively worsening shortness of breath with ankle swelling over the past week.  Patient is followed closely by the PCP who they saw today and to send him to the emergency department.  Last week the patient was seen and found to have worsening renal function.  Family elected to return home and try aggressive oral hydration with repeat appointment today.  The caregiver states they tried aggressive hydration but the patient's urine output has decreased significantly along with some associated diarrhea.  They deny any fevers or chills.  The patient has not complained of chest pain or abdominal discomfort.  The only medication change is the family states the patient's Coumadin was discontinued which she had been on for atrial fibrillation.   Level 5 caveat: Dementia.   Past Medical History:  Diagnosis Date  . Acute renal insufficiency 12/24/2013  . Amaurosis fugax   . Arthritis 12/06/2012  . Atherosclerosis   . CAD (coronary artery disease)    a. s/p CABG x 4 (LIMA->LAD, VG->Diag, VG->OM1->OM3);  b. 2010 Cath: 3/4 patent grafts (VG->diag occluded), 3vd, sev PVD ->Med Rx;  c. 12/2011 Lexi MV: sm area of mild mid and apical ant wall ischemia ->med rx.  . Carotid bruit   . Chronic kidney disease (CKD) stage G4/A1, severely decreased glomerular filtration rate (GFR) between 15-29 mL/min/1.73 square meter and albuminuria creatinine ratio less than 30 mg/g (HCC)    GFR 25 on 12/25/13  . CVA (cerebral infarction)   . Dementia    pt denies  . Depression 12/24/2013  . Diabetes mellitus type II    insulin dependent.   . Diabetic peripheral neuropathy ([place])   . Diabetic retinopathy   .  Diverticulosis   . Duodenal ulcer 2009 and 11/2011   caused GI bleeding  . Gallstones 2009   seen on CT scan  . GI bleed 04/2011, 11/2011   found non-bleeding duodenal ulcers  . Hyperlipidemia   . Hypersomnolence 03/12/2013  . Hypertension   . Iron deficiency anemia secondary to blood loss (chronic)   . Melena 1015/2015   PEPTIC ULCER    REQUIRING TRANSFUSION  . Pedal edema 10/12/2016  . PVD (peripheral vascular disease) ([place])   . Rib fracture 04/21/2015  . Stroke ([place])   . Thiamine deficiency 08/13/2015  . UTI (urinary tract infection) 04/08/2014  . Weight loss, non-intentional 01/29/2014    Patient Active Problem List   Diagnosis Date Noted  . Diverticulosis of colon without hemorrhage   . Anticoagulated on Coumadin 06/07/2017  . Lower GI bleed 06/07/2017  . GI bleed 06/07/2017  . Encounter for therapeutic drug monitoring 05/31/2017  . DVT (deep venous thrombosis) ([place]) 05/22/2017  . Atrial fibrillation with RVR ([place]) 05/22/2017  . Ataxia 05/21/2017  . Shortness of breath 04/28/2017  . Stroke (cerebrum) ([place]) 02/01/2017  . Sinusitis 01/31/2017  . Pedal edema 10/12/2016  . Thiamine deficiency 08/13/2015  . Rib fracture 04/21/2015  . UTI (urinary tract infection) 04/08/2014  . Duodenal ulcer with hemorrhage 12/29/2013  . CKD (chronic kidney disease) stage 4, GFR 15-29 ml/min (HCC) 12/28/2013  . GIB (gastrointestinal bleeding) 12/28/2013  . History of CVA (cerebrovascular accident) 12/28/2013  . Chronic diastolic  heart failure ([place]) 12/28/2013  . Overweight (BMI 25.0-29.9) 12/28/2013  . Depression 12/24/2013  . Hypersomnolence 03/12/2013  . Arthritis 12/06/2012  . Unstable angina ([place]) 06/07/2012  . CAD (coronary artery disease) of artery bypass graft   . Hx of CABG 06/05/2012  . Debility 06/05/2012  . Anemia 05/27/2011  . Diabetes mellitus type 2, insulin dependent ([place]) 12/22/2010  . Dementia 12/22/2010  . B12 deficiency 12/22/2010  . Type 2 diabetes mellitus with  diabetic neuropathy, unspecified ([place]) 08/28/2008  . MURMUR 07/04/2008  . AMAUROSIS FUGAX 08/12/2007  . Hyperlipidemia 06/22/2007  . Essential hypertension 06/22/2007  . Peripheral vascular disease ([place]) 06/21/2007  . CAROTID BRUIT 06/21/2007    Past Surgical History:  Procedure Laterality Date  . ABDOMINAL HYSTERECTOMY    . COLONOSCOPY  05/20/2011   Procedure: COLONOSCOPY;  Surgeon: Scarlette Shorts, MD;  Location: [facility];  Service: Endoscopy;  Laterality: N/A;  . COLONOSCOPY WITH PROPOFOL N/A 06/09/2017   Procedure: COLONOSCOPY WITH PROPOFOL;  Surgeon: Milus Banister, MD;  Location: [facility] ENDOSCOPY;  Service: Endoscopy;  Laterality: N/A;  . CORONARY ARTERY BYPASS GRAFT  1999   x 4  . ENTEROSCOPY N/A 12/29/2013   Procedure: ENTEROSCOPY;  Surgeon: Inda Castle, MD;  Location: [facility];  Service: Endoscopy;  Laterality: N/A;  . ESOPHAGOGASTRODUODENOSCOPY  05/16/2011   Procedure: ESOPHAGOGASTRODUODENOSCOPY (EGD);  Surgeon: Gatha Mayer, MD;  Location: [facility] ENDOSCOPY;  Service: Endoscopy;  Laterality: N/A;  . ESOPHAGOGASTRODUODENOSCOPY  12/08/2011   Procedure: ESOPHAGOGASTRODUODENOSCOPY (EGD);  Surgeon: Ladene Artist, MD,FACG;  Location: [facility] ENDOSCOPY;  Service: Endoscopy;  Laterality: N/A;  . ESOPHAGOGASTRODUODENOSCOPY (EGD) WITH PROPOFOL N/A 06/08/2017   Procedure: ESOPHAGOGASTRODUODENOSCOPY (EGD) WITH PROPOFOL;  Surgeon: Milus Banister, MD;  Location: [facility] ENDOSCOPY;  Service: Endoscopy;  Laterality: N/A;  . UPPER GASTROINTESTINAL ENDOSCOPY  02/22/2008   w/biopsy, duedenal ulcer, antrum erosions    Allergies Patient has no known allergies.  Family History  Problem Relation Age of Onset  . Coronary artery disease Father   . Hypertension Father   . Stroke Father   . Cancer Mother        ?  . Diabetes Maternal Uncle   . Stroke Sister   . Colon cancer Neg Hx     Social History Social History   Tobacco Use  . Smoking status: Former Smoker    Types: Cigarettes    Last  attempt to quit: 12/31/2000    Years since quitting: 16.7  . Smokeless tobacco: Never Used  Substance Use Topics  . Alcohol use: No  . Drug use: No    Review of Systems  Constitutional: No fever/chills. Positive fatigue.  Eyes: No visual changes. ENT: No sore throat. Cardiovascular: Denies chest pain. Positive bilateral LE swelling.  Respiratory: Positive shortness of breath. Gastrointestinal: No abdominal pain.  No nausea, no vomiting.  No diarrhea.  No constipation. Genitourinary: Negative for dysuria. Musculoskeletal: Negative for back pain. Skin: Negative for rash. Neurological: Negative for headaches, focal weakness or numbness.  10-point ROS otherwise negative.  ____________________________________________   PHYSICAL EXAM:  VITAL SIGNS: ED Triage Vitals  Enc Vitals Group     BP 10/12/17 1053 (!) 157/62     Pulse Rate 10/12/17 1053 (!) 53     Resp 10/12/17 1053 (!) 24     Temp 10/12/17 1053 98 F (36.7 C)     Temp Source 10/12/17 1053 Oral     SpO2 10/12/17 1053 97 %     Weight 10/12/17 1051 160 lb (72.6  kg)     Height 10/12/17 1051 5\' 7"  (1.702 m)     Pain Score 10/12/17 1051 0   Constitutional: Alert with some mild baseline confusion. Well appearing and in no acute distress. Eyes: Conjunctivae are normal. Head: Atraumatic. Nose: No congestion/rhinnorhea. Mouth/Throat: Mucous membranes are moist.   Neck: No stridor.  Cardiovascular: Bradycardia. Good peripheral circulation. Grossly normal heart sounds.   Respiratory: Increased respiratory effort.  No retractions. Lungs with faint end-expiratory wheezing bilaterally.  Gastrointestinal: Soft and nontender. No distention.  Musculoskeletal: No lower extremity tenderness with trace pitting edema in the extremities bilaterally but R>L. No gross deformities of extremities. Neurologic:  Normal speech and language. No gross focal neurologic deficits are appreciated.  Skin:  Skin is warm, dry and intact. No rash  noted.  ____________________________________________   LABS (all labs ordered are listed, but only abnormal results are displayed)  Labs Reviewed  COMPREHENSIVE METABOLIC PANEL - Abnormal; Notable for the following components:      Result Value   Glucose, Bld 186 (*)    BUN 35 (*)    Creatinine, Ser 1.89 (*)    Albumin 3.2 (*)    GFR calc non Af Amer 23 (*)    GFR calc Af Amer 27 (*)    All other components within normal limits  BRAIN NATRIURETIC PEPTIDE - Abnormal; Notable for the following components:   B Natriuretic Peptide 510.8 (*)    All other components within normal limits  CBC WITH DIFFERENTIAL/PLATELET - Abnormal; Notable for the following components:   WBC 10.9 (*)    HCT 35.3 (*)    Monocytes Absolute 1.2 (*)    All other components within normal limits  URINE CULTURE  TROPONIN I   ____________________________________________  EKG   EKG Interpretation  Date/Time:  Tuesday October 12 2017 10:54:13 EDT Ventricular Rate:  52 PR Interval:    QRS Duration: 92 QT Interval:  698 QTC Calculation: 637 R Axis:     Text Interpretation:  Sinus rhythm Borderline prolonged PR interval Low voltage, precordial leads Repol abnrm suggests ischemia, diffuse leads Minimal ST elevation, lateral leads Prolonged QT interval Baseline wander in lead(s) III aVL aVF V1 No STEMI. Similar to prior.  Confirmed by Nanda Quinton [phone]) on 10/12/2017 11:11:45 AM       ____________________________________________  RADIOLOGY  Dg Chest 2 View  Result Date: 10/12/2017 CLINICAL DATA:  Shortness of breath and wheezing since last night, former smoker, chronic kidney disease, diabetes mellitus, stroke, hypertension, former smoker EXAM: CHEST - 2 VIEW COMPARISON:  05/21/2017 FINDINGS: Enlargement of cardiac silhouette post CABG. Calcified tortuous thoracic aorta. Pulmonary vascularity normal. Peribronchial thickening with probable small bibasilar pleural effusions and mild LEFT basilar atelectasis.  Underlying emphysematous changes. Upper lungs clear. No pneumothorax or acute osseous findings. Bones appear demineralized. IMPRESSION: Enlargement of cardiac silhouette post CABG. COPD changes with probable small bibasilar pleural effusions and mild LEFT basilar atelectasis. Electronically Signed   By: Lavonia Dana M.D.   On: 10/12/2017 11:24    ____________________________________________   PROCEDURES  Procedure(s) performed:   Procedures  None ____________________________________________   INITIAL IMPRESSION / ASSESSMENT AND PLAN / ED COURSE  Pertinent labs & imaging results that were available during my care of the patient were reviewed by me and considered in my medical decision making (see chart for details).  To the emergency department for evaluation of worsening shortness of breath with ankle swelling over the past week.  Patient remains afebrile.  Her oxygen saturation here is  good with slow A. Fib on EKG.  The patient does have some increased work of breathing with faint wheezing but suspect this is more related to volume overload rather than wheezing from other etiology.  Plan for screening labs, chest x-ray, reassess.  Patient's labs and chest x-ray reviewed.  Chest x-ray shows left base atelectasis versus infiltrate versus asymmetric edema.  The patient's clinical picture seems one more of volume overload as opposed to pneumonia but given limiting historical detail from dementia plan to cover with antibiotics at least initially.  Patient also given Lasix.  She was given a breathing treatment with some wheezing.  Patient does have improved work of breathing on my reassessment compared to her arrival presentation.  She continues to deny chest pain.  The patient's BNP is elevated to greater than 500.  Think she would benefit from additional diuresis, repeat echo, troponin trending, and further monitoring.  Kidney function seems improved slightly from her 7/22 value.  Discussed  patient's case with Hospitalist, Dr. Lorin Mercy to request admission. Patient and family (if present) updated with plan. Care transferred to Hospitalist service.  I reviewed all nursing notes, vitals, pertinent old records, EKGs, labs, imaging (as available).  ____________________________________________  FINAL CLINICAL IMPRESSION(S) / ED DIAGNOSES  Final diagnoses:  Acute on chronic congestive heart failure, unspecified heart failure type ([place])    MEDICATIONS GIVEN DURING THIS VISIT:  Medications  ipratropium-albuterol (DUONEB) 0.5-2.5 (3) MG/3ML nebulizer solution 3 mL (3 mLs Nebulization Given 10/12/17 1201)  cefTRIAXone (ROCEPHIN) 2 g in sodium chloride 0.9 % 100 mL IVPB (2 g Intravenous New Bag/Given 10/12/17 1325)  azithromycin (ZITHROMAX) 500 mg in sodium chloride 0.9 % 250 mL IVPB (has no administration in time range)  0.9 %  sodium chloride infusion ( Intravenous New Bag/Given 10/12/17 1322)  furosemide (LASIX) injection 20 mg (20 mg Intravenous Given 10/12/17 1319)    Note:  This document was prepared using Dragon voice recognition software and may include unintentional dictation errors.  Nanda Quinton, MD Emergency Medicine    Scarlet Abad, [facility], MD 10/12/17 [phone]

## 2017-10-12 NOTE — Progress Notes (Signed)
Destiny [name] is a 84 y.o. female with PMH of CKD, CAD, Dementia, DM, HTN, and prior CVA presenting from PCP for worsening renal function, poor PO intake.  PCP was worried about worsening renal function and increased SOB, wheezing.  Renal function is actually improved.  CXR with enlarged cardiac silhouette.  ?asymmetric edema, possibly volume overloaded.  H/o diastolic dysfunction in 2/41.  Increased BNP.  ?worsening CHF.  Given antibiotics just in case.  Recommends admission for diuresis and monitoring.  UA is also pending.  He does think she is stable for telemetry, direct admission.  Work of breathing is improved, not in acute distress.  Carlyon Shadow, M.D.

## 2017-10-12 NOTE — Progress Notes (Signed)
Subjective:    Patient ID: Destiny [name], female    DOB: Sep 17, 1931, 84 y.o.   MRN: [id number]  HPI  Pt in for follow up.  She had  dehydration on labs and hx  uti. She still has very low energy.   Her gfr has been very low and creatine has been high.   She has been drinking a lot of sparking water, and lemonade since last visit.  Family thought that she had some labored breathing coming into office. No chest pain. Hx of cardiomegaly.  She was having some loose stools on Thursday But then last night well formed stool.  Some wheezing now in office. Hx of smoking 84 yo- 84 yo. EF 50-55%.  Hx of diabetes and a1c was 7.5.   Also hx of dvt in April.     Review of Systems  Constitutional: Positive for fatigue. Negative for chills and fever.  Respiratory: Positive for shortness of breath and wheezing. Negative for cough and chest tightness.   Cardiovascular: Negative for chest pain and palpitations.  Gastrointestinal: Negative for abdominal pain, blood in stool, constipation and nausea.  Genitourinary:       Poor urine output.  Musculoskeletal: Negative for back pain.  Skin: Negative for rash.  Neurological: Positive for weakness. Negative for dizziness, speech difficulty and light-headedness.  Hematological: Negative for adenopathy. Does not bruise/bleed easily.  Psychiatric/Behavioral: Negative for behavioral problems, confusion and sleep disturbance. The patient is not nervous/anxious.     Past Medical History:  Diagnosis Date  . Acute renal insufficiency 12/24/2013  . Amaurosis fugax   . Arthritis 12/06/2012  . Atherosclerosis   . CAD (coronary artery disease)    a. s/p CABG x 4 (LIMA->LAD, VG->Diag, VG->OM1->OM3);  b. 2010 Cath: 3/4 patent grafts (VG->diag occluded), 3vd, sev PVD ->Med Rx;  c. 12/2011 Lexi MV: sm area of mild mid and apical ant wall ischemia ->med rx.  . Carotid bruit   . Chronic kidney disease (CKD) stage G4/A1, severely decreased glomerular  filtration rate (GFR) between 15-29 mL/min/1.73 square meter and albuminuria creatinine ratio less than 30 mg/g (HCC)    GFR 25 on 12/25/13  . CVA (cerebral infarction)   . Dementia    pt denies  . Depression 12/24/2013  . Diabetes mellitus type II    insulin dependent.   . Diabetic peripheral neuropathy ([place])   . Diabetic retinopathy   . Diverticulosis   . Duodenal ulcer 2009 and 11/2011   caused GI bleeding  . Gallstones 2009   seen on CT scan  . GI bleed 04/2011, 11/2011   found non-bleeding duodenal ulcers  . Hyperlipidemia   . Hypersomnolence 03/12/2013  . Hypertension   . Iron deficiency anemia secondary to blood loss (chronic)   . Melena 1015/2015   PEPTIC ULCER    REQUIRING TRANSFUSION  . Pedal edema 10/12/2016  . PVD (peripheral vascular disease) ([place])   . Rib fracture 04/21/2015  . Thiamine deficiency 08/13/2015  . UTI (urinary tract infection) 04/08/2014  . Weight loss, non-intentional 01/29/2014     Social History   Socioeconomic History  . Marital status: Widowed    Spouse name: Not on file  . Number of children: 4  . Years of education: Not on file  . Highest education level: Not on file  Occupational History  . Occupation: retired    Fish farm manager: RETIRED  Social Needs  . Financial resource strain: Not on file  . Food insecurity:    Worry: Not on  file    Inability: Not on file  . Transportation needs:    Medical: Not on file    Non-medical: Not on file  Tobacco Use  . Smoking status: Former Smoker    Types: Cigarettes    Last attempt to quit: 12/31/2000    Years since quitting: 16.7  . Smokeless tobacco: Never Used  Substance and Sexual Activity  . Alcohol use: No  . Drug use: No  . Sexual activity: Not on file  Lifestyle  . Physical activity:    Days per week: Not on file    Minutes per session: Not on file  . Stress: Not on file  Relationships  . Social connections:    Talks on phone: Not on file    Gets together: Not on file    Attends  religious service: Not on file    Active member of club or organization: Not on file    Attends meetings of clubs or organizations: Not on file    Relationship status: Not on file  . Intimate partner violence:    Fear of current or ex partner: Not on file    Emotionally abused: Not on file    Physically abused: Not on file    Forced sexual activity: Not on file  Other Topics Concern  . Not on file  Social History Narrative   Widowed - husband passed away in 2009-05-04   lives with daughter with epilepsy - mental retardation   Retired     Former Smoker      Alcohol use-no          Occupation: Retired       son - Kizzy Olafson     Past Surgical History:  Procedure Laterality Date  . ABDOMINAL HYSTERECTOMY    . COLONOSCOPY  05/20/2011   Procedure: COLONOSCOPY;  Surgeon: Scarlette Shorts, MD;  Location: [place];  Service: Endoscopy;  Laterality: N/A;  . COLONOSCOPY WITH PROPOFOL N/A 06/09/2017   Procedure: COLONOSCOPY WITH PROPOFOL;  Surgeon: Milus Banister, MD;  Location: [facility] ENDOSCOPY;  Service: Endoscopy;  Laterality: N/A;  . CORONARY ARTERY BYPASS GRAFT  1999   x 4  . ENTEROSCOPY N/A 12/29/2013   Procedure: ENTEROSCOPY;  Surgeon: Inda Castle, MD;  Location: [place];  Service: Endoscopy;  Laterality: N/A;  . ESOPHAGOGASTRODUODENOSCOPY  05/16/2011   Procedure: ESOPHAGOGASTRODUODENOSCOPY (EGD);  Surgeon: Gatha Mayer, MD;  Location: [facility] ENDOSCOPY;  Service: Endoscopy;  Laterality: N/A;  . ESOPHAGOGASTRODUODENOSCOPY  12/08/2011   Procedure: ESOPHAGOGASTRODUODENOSCOPY (EGD);  Surgeon: Ladene Artist, MD,FACG;  Location[facility] ENDOSCOPY;  Service: Endoscopy;  Laterality: N/A;  . ESOPHAGOGASTRODUODENOSCOPY (EGD) WITH PROPOFOL N/A 06/08/2017   Procedure: ESOPHAGOGASTRODUODENOSCOPY (EGD) WITH PROPOFOL;  Surgeon: Milus Banister, MD;  Location: [facility] ENDOSCOPY;  Service: Endoscopy;  Laterality: N/A;  . UPPER GASTROINTESTINAL ENDOSCOPY  02/22/2008   w/biopsy, duedenal ulcer, antrum erosions     Family History  Problem Relation Age of Onset  . Coronary artery disease Father   . Hypertension Father   . Stroke Father   . Cancer Mother        ?  . Diabetes Maternal Uncle   . Stroke Sister   . Colon cancer Neg Hx     No Known Allergies  Current Outpatient Medications on File Prior to Visit  Medication Sig Dispense Refill  . albuterol (PROAIR HFA) 108 (90 Base) MCG/ACT inhaler Inhale 2 puffs into the lungs every 6 (six) hours as needed for wheezing or shortness of  breath. 18 g 2  . amLODipine (NORVASC) 5 MG tablet Take 1 tablet (5 mg total) by mouth daily. 90 tablet 1  . aspirin EC 81 MG tablet Take 1 tablet (81 mg total) by mouth daily.    Marland Kitchen atorvastatin (LIPITOR) 20 MG tablet Take 1 tablet (20 mg total) by mouth daily. 90 tablet 1  . blood glucose meter kit and supplies KIT Dispense based on patient and insurance preference. Use up to four times daily as directed. Dx: E11.9, Z79.4 1 each 0  . cefdinir (OMNICEF) 300 MG capsule Take 1 capsule (300 mg total) by mouth 2 (two) times daily. 14 capsule 0  . Cholecalciferol (VITAMIN D) 1000 UNITS capsule Take 1,000 Units by mouth daily.     . citalopram (CELEXA) 20 MG tablet Take 1 tablet (20 mg total) by mouth daily. 90 tablet 1  . ferrous sulfate 325 (65 FE) MG tablet Take 325 mg daily with breakfast by mouth.    . furosemide (LASIX) 20 MG tablet Take 1 tablet (20 mg total) by mouth daily as needed. Edema or weight >3#/24 hours 30 tablet 3  . insulin glargine (LANTUS) 100 UNIT/ML injection Inject 63m [place] at bedtime 1500 mL 1  . insulin lispro (HUMALOG KWIKPEN) 100 UNIT/ML KiwkPen Inject 0.05-0.1 mLs (5-10 Units total) into the skin 3 (three) times daily with meals. 15 mL 4  . ipratropium (ATROVENT HFA) 17 MCG/ACT inhaler Inhale 2 puffs into the lungs every 6 (six) hours as needed for wheezing. 1 Inhaler 12  . ipratropium-albuterol (DUONEB) 0.5-2.5 (3) MG/3ML SOLN Take 3 mLs by nebulization every 6 (six) hours as needed. 360 mL 1   . isosorbide mononitrate (IMDUR) 30 MG 24 hr tablet Take 1 tablet (30 mg total) by mouth at bedtime. 90 tablet 1  . nitroGLYCERIN (NITROSTAT) 0.4 MG SL tablet Place 0.4 mg under the tongue every 5 (five) minutes as needed for chest pain.    . Omega-3 Fatty Acids (FISH OIL) 1200 MG CAPS Take 1,200 mg by mouth daily.     . pantoprazole (PROTONIX) 40 MG tablet Take 1 tablet (40 mg total) by mouth daily. 90 tablet 0  . thiamine (VITAMIN B-1) 100 MG tablet Take 1 tablet (100 mg total) by mouth daily. (Patient taking differently: Take 100 mg See admin instructions by mouth. Monday, Wednesday and Friday) 30 tablet 5   No current facility-administered medications on file prior to visit.     BP (!) 150/60   Pulse (!) 53   Temp 97.8 F (36.6 C) (Oral)   Resp 16   Ht _0  (1.702 m)   Wt 160 lb 12.8 oz (72.9 kg)   SpO2 98%   BMI 25.18 kg/m       Objective:   Physical Exam  General Mental Status- Alert but confused baseline. General Appearance- Not in acute distress. But moderate wheeze intemitent.  Skin General: Color- Normal Color. Moisture- Normal Moisture.  Neck Carotid Arteries- Normal color. Moisture- Normal Moisture. No carotid bruits. No JVD.  Chest and Lung Exam Auscultation: Breath Sounds:-Normal. She has some scattered wheezing.  Cardiovascular Auscultation:Rythm- Regular. Murmurs & Other Heart Sounds:Auscultation of the heart reveals- No Murmurs.  Abdomen Inspection:-Inspeection Normal. Palpation/Percussion:Note:No mass. Palpation and Percussion of the abdomen reveal- Non Tender, Non Distended + BS, no rebound or guarding.  Neurologic Cranial Nerve exam:- CN III-XII intact(No nystagmus), symmetric smile. Strength:- 4/5 equal and symmetric strength both upper and lower extremities.  Bilateral lower ext- 1-2+ pedal edema rt side.  Left side 1+ Negative homans signs.    Assessment & Plan:  I do recommend that you be evaluated in ED today. Benefit would be state  work up to differentiate copd vs chf(also hx of dvt in left lower ext in spring). Stat labs to assess kidney function.   I did discuss case with ED MD this working downstairs and informed him of your recent signs and symptoms over the past week.  Please go downstairs and let receptionist/triage staff know your mother's recent dyspnea and wheezing this morning as well as history of CHF, probable COPD and left lower extremity DVT.  I believe that they will likely give your mother IV hydration get a urine sample and work-up for dyspnea.  Follow-up with [place] per ED instructions.  40 minutes spent wit patient. 50% of time counseling on need and reasoning on why I do think evaluation in ED is the most appropiate course of action as son was initially reluctant. Then discussed pt recent history with ED MD.  Mackie Pai, PA-C

## 2017-10-12 NOTE — Progress Notes (Addendum)
I went back and spoke with family.  They are in agreement with plan as outlined prior. Patient is DNR.  She does have caregivers but not full time.  The family is eager to get her home as soon as possible (and so observation is fine with them if appropriate) and is beginning to think about how they want to provide closer supervision for her.  Of note, she was previously on Coumadin for superficial DVT.  She does have h/o intermittent afib but the decision was made to NOT anticoagulate for this issue.  Coumadin was stopped by her PCP on 7/19.  Will not resume at this time.  Will add Lovenox for DVT prophylaxis.  Carlyon Shadow, M.D.

## 2017-10-12 NOTE — ED Notes (Signed)
In and out cath done and purwick placed

## 2017-10-12 NOTE — Progress Notes (Signed)
Pt admitted to the unit from [place]; pt alert and verbally responsive but pleasantly confused. Pt VSS: telemetry applied and verified with CCMD; NT called to second verify. Charge Rn second verify skin assessment. Pt skin, clean, dry and intact with no opened wounds or pressure ulcers noted except for scabbed abrasions to right hand. Pt oriented to the unit and room; purwick on; bed alarm on; MD admitting notified; awaiting no new orders. Per [facility] pt has Biomedical engineer at home; Agricultural consultant notified for safety. Bed alarm On; call light within reach. Will continue to closely monitor Pt. Delia Heady RN

## 2017-10-12 NOTE — ED Triage Notes (Signed)
Sent from PCP c/o SOB for 1 week with swelling to ankles.

## 2017-10-12 NOTE — H&P (Signed)
History and Physical    Destiny [name]:[id number] DOB: Jul 25, 1931 DOA: 10/12/2017  PCP: Mosie Lukes, MD Consultants:  None Patient coming from:  Home - lives alone; NOK: Sons  Chief Complaint:  SOB  HPI: Destiny [name] is a 85 y.o. female with medical history significant of CKD, CAD, Dementia, DM, HTN, and prior CVA presenting from PCP for poor PO intake and worsening renal function.  She doesn't know why she is here.  She reports that she is is feeling well.  She has dementia and is unable to provide history; she is unaccompanied.  Per PCP note from this AM, family reports: Poor energy; some labored breathing; some loose stools remotely; wheezing in office.   ED Course: PCP was worried about worsening renal function and increased SOB, wheezing.  Renal function is actually improved.  CXR with enlarged cardiac silhouette.  ?asymmetric edema, possibly volume overloaded.  H/o diastolic dysfunction in 2/83.  Increased BNP.  ?worsening CHF.  Given antibiotics just in case.  Recommends admission for diuresis and monitoring.  UA is also pending.  He does think she is stable for telemetry, direct admission.  Work of breathing is improved, not in acute distress.   Review of Systems: As per HPI; otherwise review of systems reviewed and negative. However, given the extent of patient's apparent dementia, this is likely inaccurate.  PMH, PSH, SH, and FH reviewed in the chart.  Past Medical History:  Diagnosis Date  . Acute renal insufficiency 12/24/2013  . Amaurosis fugax   . Arthritis 12/06/2012  . Atherosclerosis   . CAD (coronary artery disease)    a. s/p CABG x 4 (LIMA->LAD, VG->Diag, VG->OM1->OM3);  b. 2010 Cath: 3/4 patent grafts (VG->diag occluded), 3vd, sev PVD ->Med Rx;  c. 12/2011 Lexi MV: sm area of mild mid and apical ant wall ischemia ->med rx.  . Carotid bruit   . Chronic kidney disease (CKD) stage G4/A1, severely decreased glomerular filtration rate (GFR) between 15-29  mL/min/1.73 square meter and albuminuria creatinine ratio less than 30 mg/g (HCC)    GFR 25 on 12/25/13  . CVA (cerebral infarction)   . Dementia    pt denies  . Depression 12/24/2013  . Diabetes mellitus type II    insulin dependent.   . Diabetic peripheral neuropathy ([place])   . Diabetic retinopathy   . Diverticulosis   . Duodenal ulcer 2009 and 11/2011   caused GI bleeding  . Gallstones 2009   seen on CT scan  . GI bleed 04/2011, 11/2011   found non-bleeding duodenal ulcers  . Hyperlipidemia   . Hypersomnolence 03/12/2013  . Hypertension   . Iron deficiency anemia secondary to blood loss (chronic)   . Melena 1015/2015   PEPTIC ULCER    REQUIRING TRANSFUSION  . Pedal edema 10/12/2016  . PVD (peripheral vascular disease) ([place])   . Rib fracture 04/21/2015  . Stroke ([place])   . Thiamine deficiency 08/13/2015  . UTI (urinary tract infection) 04/08/2014  . Weight loss, non-intentional 01/29/2014    Past Surgical History:  Procedure Laterality Date  . ABDOMINAL HYSTERECTOMY    . COLONOSCOPY  05/20/2011   Procedure: COLONOSCOPY;  Surgeon: Scarlette Shorts, MD;  Location: [place];  Service: Endoscopy;  Laterality: N/A;  . COLONOSCOPY WITH PROPOFOL N/A 06/09/2017   Procedure: COLONOSCOPY WITH PROPOFOL;  Surgeon: Milus Banister, MD;  Location: [facility] ENDOSCOPY;  Service: Endoscopy;  Laterality: N/A;  . CORONARY ARTERY BYPASS GRAFT  1999   x 4  . ENTEROSCOPY  N/A 12/29/2013   Procedure: ENTEROSCOPY;  Surgeon: Inda Castle, MD;  Location: [place];  Service: Endoscopy;  Laterality: N/A;  . ESOPHAGOGASTRODUODENOSCOPY  05/16/2011   Procedure: ESOPHAGOGASTRODUODENOSCOPY (EGD);  Surgeon: Gatha Mayer, MD;  Location: [facility] ENDOSCOPY;  Service: Endoscopy;  Laterality: N/A;  . ESOPHAGOGASTRODUODENOSCOPY  12/08/2011   Procedure: ESOPHAGOGASTRODUODENOSCOPY (EGD);  Surgeon: Ladene Artist, MD,FACG;  Location: [facility] ENDOSCOPY;  Service: Endoscopy;  Laterality: N/A;  . ESOPHAGOGASTRODUODENOSCOPY (EGD) WITH  PROPOFOL N/A 06/08/2017   Procedure: ESOPHAGOGASTRODUODENOSCOPY (EGD) WITH PROPOFOL;  Surgeon: Milus Banister, MD;  Location: [facility] ENDOSCOPY;  Service: Endoscopy;  Laterality: N/A;  . UPPER GASTROINTESTINAL ENDOSCOPY  02/22/2008   w/biopsy, duedenal ulcer, antrum erosions    Social History   Socioeconomic History  . Marital status: Widowed    Spouse name: Not on file  . Number of children: 4  . Years of education: Not on file  . Highest education level: Not on file  Occupational History  . Occupation: retired    Fish farm manager: RETIRED  Social Needs  . Financial resource strain: Not on file  . Food insecurity:    Worry: Not on file    Inability: Not on file  . Transportation needs:    Medical: Not on file    Non-medical: Not on file  Tobacco Use  . Smoking status: Former Smoker    Types: Cigarettes    Last attempt to quit: 12/31/2000    Years since quitting: 16.7  . Smokeless tobacco: Never Used  Substance and Sexual Activity  . Alcohol use: No  . Drug use: No  . Sexual activity: Not on file  Lifestyle  . Physical activity:    Days per week: Not on file    Minutes per session: Not on file  . Stress: Not on file  Relationships  . Social connections:    Talks on phone: Not on file    Gets together: Not on file    Attends religious service: Not on file    Active member of club or organization: Not on file    Attends meetings of clubs or organizations: Not on file    Relationship status: Not on file  . Intimate partner violence:    Fear of current or ex partner: Not on file    Emotionally abused: Not on file    Physically abused: Not on file    Forced sexual activity: Not on file  Other Topics Concern  . Not on file  Social History Narrative   Widowed - husband passed away in May 02, 2009   lives with daughter with epilepsy - mental retardation   Retired     Former Smoker      Alcohol use-no          Occupation: Retired       son - Mashelle Busick     No Known  Allergies  Family History  Problem Relation Age of Onset  . Coronary artery disease Father   . Hypertension Father   . Stroke Father   . Cancer Mother        ?  . Diabetes Maternal Uncle   . Stroke Sister   . Colon cancer Neg Hx     Prior to Admission medications   Medication Sig Start Date End Date Taking? Authorizing Provider  albuterol (PROAIR HFA) 108 (90 Base) MCG/ACT inhaler Inhale 2 puffs into the lungs every 6 (six) hours as needed for wheezing or shortness of breath. 05/27/17   Saguier, Percell Miller,  PA-C  amLODipine (NORVASC) 5 MG tablet Take 1 tablet (5 mg total) by mouth daily. 08/25/17   Mosie Lukes, MD  aspirin EC 81 MG tablet Take 1 tablet (81 mg total) by mouth daily. 10/01/17   Mosie Lukes, MD  atorvastatin (LIPITOR) 20 MG tablet Take 1 tablet (20 mg total) by mouth daily. 04/27/17   Mosie Lukes, MD  blood glucose meter kit and supplies KIT Dispense based on patient and insurance preference. Use up to four times daily as directed. Dx: E11.9, Z79.4 09/14/17   Mosie Lukes, MD  cefdinir (OMNICEF) 300 MG capsule Take 1 capsule (300 mg total) by mouth 2 (two) times daily. 10/05/17   Mosie Lukes, MD  Cholecalciferol (VITAMIN D) 1000 UNITS capsule Take 1,000 Units by mouth daily.     [provider]  citalopram (CELEXA) 20 MG tablet Take 1 tablet (20 mg total) by mouth daily. 04/27/17   Mosie Lukes, MD  ferrous sulfate 325 (65 FE) MG tablet Take 325 mg daily with breakfast by mouth.    [provider]  furosemide (LASIX) 20 MG tablet Take 1 tablet (20 mg total) by mouth daily as needed. Edema or weight >3#/24 hours 10/01/17   Mosie Lukes, MD  insulin glargine (LANTUS) 100 UNIT/ML injection Inject 67m Dennis at bedtime 09/03/17   BMosie Lukes MD  insulin lispro (HUMALOG KWIKPEN) 100 UNIT/ML KiwkPen Inject 0.05-0.1 mLs (5-10 Units total) into the skin 3 (three) times daily with meals. 03/02/16   BMosie Lukes MD  ipratropium (ATROVENT HFA) 17  MCG/ACT inhaler Inhale 2 puffs into the lungs every 6 (six) hours as needed for wheezing. 05/21/17   Saguier, EPercell Miller PA-C  ipratropium-albuterol (DUONEB) 0.5-2.5 (3) MG/3ML SOLN Take 3 mLs by nebulization every 6 (six) hours as needed. 06/10/17   Matcha, ABeverely Pace MD  isosorbide mononitrate (IMDUR) 30 MG 24 hr tablet Take 1 tablet (30 mg total) by mouth at bedtime. 08/25/17   BMosie Lukes MD  nitroGLYCERIN (NITROSTAT) 0.4 MG SL tablet Place 0.4 mg under the tongue every 5 (five) minutes as needed for chest pain.    [provider]  Omega-3 Fatty Acids (FISH OIL) 1200 MG CAPS Take 1,200 mg by mouth daily.     [provider]  pantoprazole (PROTONIX) 40 MG tablet Take 1 tablet (40 mg total) by mouth daily. 08/25/17   BMosie Lukes MD  thiamine (VITAMIN B-1) 100 MG tablet Take 1 tablet (100 mg total) by mouth daily. Patient taking differently: Take 100 mg See admin instructions by mouth. Monday, Wednesday and Friday 02/24/16   BMosie Lukes MD    Physical Exam: Vitals:   10/12/17 1300 10/12/17 1315 10/12/17 1329 10/12/17 1504  BP: (!) 141/51  (!) 156/60 (!) 176/62  Pulse:  (!) 51 (!) 57 (!) 55  Resp:   15 16  Temp:    98.6 F (37 C)  TempSrc:    Oral  SpO2:  94% 94% 97%  Weight:    75.2 kg (165 lb 12.6 oz)  Height:         General:  Appears calm and comfortable and is NAD Eyes:   EOMI, normal lids, iris ENT:  Mildly hard of hearing, lips & tongue, mmm Neck:  no LAD, masses or thyromegaly; no JVD Cardiovascular:  RRR, no m/r/g. No LE edema.  Respiratory:   CTA bilaterally with no wheezes/rales/rhonchi.  Normal respiratory effort. Abdomen:  soft, NT, ND, NABS Skin:  no rash or induration seen on limited exam Musculoskeletal:  grossly normal tone BUE/BLE, good ROM, no bony abnormality Psychiatric:  grossly normal mood and affect, speech fluent and appropriate, AOx1 Neurologic:  CN 2-12 grossly intact, moves all extremities in coordinated fashion, sensation  intact    Radiological Exams on Admission: Dg Chest 2 View  Result Date: 10/12/2017 CLINICAL DATA:  Shortness of breath and wheezing since last night, former smoker, chronic kidney disease, diabetes mellitus, stroke, hypertension, former smoker EXAM: CHEST - 2 VIEW COMPARISON:  05/21/2017 FINDINGS: Enlargement of cardiac silhouette post CABG. Calcified tortuous thoracic aorta. Pulmonary vascularity normal. Peribronchial thickening with probable small bibasilar pleural effusions and mild LEFT basilar atelectasis. Underlying emphysematous changes. Upper lungs clear. No pneumothorax or acute osseous findings. Bones appear demineralized. IMPRESSION: Enlargement of cardiac silhouette post CABG. COPD changes with probable small bibasilar pleural effusions and mild LEFT basilar atelectasis. Electronically Signed   By: Lavonia Dana M.D.   On: 10/12/2017 11:24    EKG: Independently reviewed.  NSR with rate 52; nonspecific ST changes with no evidence of acute ischemia   Labs on Admission: I have personally reviewed the available labs and imaging studies at the time of the admission.  Pertinent labs:   Glucose 186 BUN 35/Creatinine 1.89/GFR 23; prior 39/2.49/20 on 7/22; currently back to baseline BNP 510.8; 210.0 on 3/8 Troponin <0.03 WBC 10.9   Assessment/Plan Principal Problem:   Shortness of breath Active Problems:   Hyperlipidemia   Diabetes mellitus type 2, insulin dependent (HCC)   Dementia   CKD (chronic kidney disease) stage 4, GFR 15-29 ml/min (HCC)   Chronic diastolic heart failure (HCC)   Anticoagulated on Coumadin   SOB -[facility] physician at [facility] presented patient as initially with increased WOB -Concern for CAP - given Rocephin and Azithro -Concern for worsening CHF - given Lasix -Also with h/o COPD - given nebs -At the time of my evaluation, the patient was unable to provide history; was unaccompanied; appeared very comfortable; and had an essentially normal exam (other than  apparent dementia) -She was admitted prior to transfer but unless she has declaration of the current issue this may need to be changed to observation -Will monitor on telemetry -Will repeat Echo to ensure no further decompensation -prn Duonebs -Will not continue antibiotics at this time -Of note, she was given empiric antibiotics (Omnicef) on 7/23 despite no UA obtained  CKD -Appears to be back to baseline, so recent treatment for AKI was effective -Will recheck in AM  Chronic CHF -Appears to be stable at this time -BNP was elevated more than prior -3/19 echo with EF 50-55%; 10/18 echo with grade 2 diastolic dysfunction -Will repeat Echo -Change Lasix from 20 mg prn to 20 mg PO BID -No ACE due to CKD -Hold Norvasc, as this can exacerbate edema/CHF -No beta blocker due to resting bradycardia  Afib, on Coumadin -Rate controlled and in fact bradycardia without rate-controlling medication -INR 2.3 on 7/17 -Continue Coumadin as per pharmacy  DM -A1c 7.5 on 7/16 -Continue Lantus -Cover with sensitive-scale SSI  HLD -Continue Lipitor  Dementia -She does not appear to be on medication for this -She appears to have at least moderate dementia -Family not present at the time of my evaluation (although they had the nurse page me about an hour later to ask me to come to evaluate the patient, not realizing that I had already been there) -It is not clear that the patient is appropriate for ongoing independent living -Code status  should also be discussed  DVT prophylaxis: Coumadin Code Status:  Full  Family Communication: None present  Disposition Plan:  Home once clinically improved Consults called: CM, PT  Admission status: Admit - It is my clinical opinion that admission to INPATIENT is reasonable and necessary because of the expectation that this patient will require hospital care that crosses at least 2 midnights to treat this condition based on the medical complexity of the  problems presented.  Given the aforementioned information, the predictability of an adverse outcome is felt to be significant.    Karmen Bongo MD [facility]  If note is complete, please contact covering daytime or nighttime physician. [email or web address] Password [email or web address]  10/12/2017, 5:08 PM

## 2017-10-12 NOTE — Patient Instructions (Addendum)
I do recommend that you be evaluated in ED today. Benefit would be state work up to differentiate copd vs chf(also hx of dvt in left lower ext in spring). Stat labs to assess kidney function.   I did discuss case with ED MD this working downstairs and informed him of your recent signs and symptoms over the past week.  Please go downstairs and let receptionist/triage staff know your mother's recent dyspnea and wheezing this morning as well as history of CHF, probable COPD and left lower extremity DVT.  I believe that they will likely give your mother IV hydration get a urine sample and work-up for dyspnea.  Follow-up with [place] per ED instructions.

## 2017-10-13 ENCOUNTER — Other Ambulatory Visit: Payer: Self-pay

## 2017-10-13 ENCOUNTER — Inpatient Hospital Stay (HOSPITAL_COMMUNITY): Payer: Medicare Other

## 2017-10-13 ENCOUNTER — Encounter (HOSPITAL_COMMUNITY): Payer: Self-pay | Admitting: General Practice

## 2017-10-13 DIAGNOSIS — E119 Type 2 diabetes mellitus without complications: Secondary | ICD-10-CM

## 2017-10-13 DIAGNOSIS — F0390 Unspecified dementia without behavioral disturbance: Secondary | ICD-10-CM

## 2017-10-13 DIAGNOSIS — Z7901 Long term (current) use of anticoagulants: Secondary | ICD-10-CM | POA: Diagnosis not present

## 2017-10-13 DIAGNOSIS — I340 Nonrheumatic mitral (valve) insufficiency: Secondary | ICD-10-CM

## 2017-10-13 DIAGNOSIS — Z794 Long term (current) use of insulin: Secondary | ICD-10-CM

## 2017-10-13 DIAGNOSIS — I5033 Acute on chronic diastolic (congestive) heart failure: Secondary | ICD-10-CM

## 2017-10-13 DIAGNOSIS — N184 Chronic kidney disease, stage 4 (severe): Secondary | ICD-10-CM | POA: Diagnosis not present

## 2017-10-13 DIAGNOSIS — R0602 Shortness of breath: Secondary | ICD-10-CM | POA: Diagnosis present

## 2017-10-13 DIAGNOSIS — I061 Rheumatic aortic insufficiency: Secondary | ICD-10-CM | POA: Diagnosis not present

## 2017-10-13 DIAGNOSIS — I130 Hypertensive heart and chronic kidney disease with heart failure and stage 1 through stage 4 chronic kidney disease, or unspecified chronic kidney disease: Secondary | ICD-10-CM | POA: Diagnosis not present

## 2017-10-13 DIAGNOSIS — E1122 Type 2 diabetes mellitus with diabetic chronic kidney disease: Secondary | ICD-10-CM | POA: Diagnosis not present

## 2017-10-13 LAB — BASIC METABOLIC PANEL
Anion gap: 10 (ref 5–15)
BUN: 31 mg/dL — AB (ref 8–23)
CHLORIDE: 106 mmol/L (ref 98–111)
CO2: 24 mmol/L (ref 22–32)
Calcium: 9.1 mg/dL (ref 8.9–10.3)
Creatinine, Ser: 1.9 mg/dL — ABNORMAL HIGH (ref 0.44–1.00)
GFR calc Af Amer: 27 mL/min — ABNORMAL LOW (ref >60)
GFR calc non Af Amer: 23 mL/min — ABNORMAL LOW (ref >60)
Glucose, Bld: 91 mg/dL (ref 70–99)
POTASSIUM: 3.5 mmol/L (ref 3.5–5.1)
Sodium: 140 mmol/L (ref 135–145)

## 2017-10-13 LAB — GLUCOSE, CAPILLARY
GLUCOSE-CAPILLARY: 145 mg/dL — AB (ref 70–99)
Glucose-Capillary: 92 mg/dL (ref 70–99)
Glucose-Capillary: 198 mg/dL — ABNORMAL HIGH (ref 70–99)

## 2017-10-13 LAB — CBC WITH DIFFERENTIAL/PLATELET
Abs Immature Granulocytes: 0 10*3/uL (ref 0.0–0.1)
BASOS PCT: 0 %
Basophils Absolute: 0 10*3/uL (ref 0.0–0.1)
EOS ABS: 0.2 10*3/uL (ref 0.0–0.7)
EOS PCT: 3 %
HEMATOCRIT: 32.7 % — AB (ref 36.0–46.0)
Hemoglobin: 11.2 g/dL — ABNORMAL LOW (ref 12.0–15.0)
Immature Granulocytes: 0 %
LYMPHS ABS: 1.9 10*3/uL (ref 0.7–4.0)
Lymphocytes Relative: 25 %
MCH: 30.4 pg (ref 26.0–34.0)
MCHC: 34.3 g/dL (ref 30.0–36.0)
MCV: 88.9 fL (ref 78.0–100.0)
Monocytes Absolute: 0.8 10*3/uL (ref 0.1–1.0)
Monocytes Relative: 10 %
Neutro Abs: 4.8 10*3/uL (ref 1.7–7.7)
Neutrophils Relative %: 62 %
Platelets: 272 10*3/uL (ref 150–400)
RBC: 3.68 MIL/uL — ABNORMAL LOW (ref 3.87–5.11)
RDW: 12.7 % (ref 11.5–15.5)
WBC: 7.7 10*3/uL (ref 4.0–10.5)

## 2017-10-13 LAB — URINE CULTURE
CULTURE: NO GROWTH
Special Requests: NORMAL

## 2017-10-13 LAB — ECHOCARDIOGRAM COMPLETE
Height: 67 in
WEIGHTICAEL: 2494.4 [oz_av]

## 2017-10-13 MED ORDER — ISOSORBIDE MONONITRATE ER 30 MG PO TB24
30.0000 mg | ORAL_TABLET | Freq: Every day | ORAL | Status: DC
  Administered 2017-10-13: 30 mg via ORAL

## 2017-10-13 MED ORDER — ISOSORBIDE MONONITRATE ER 30 MG PO TB24: 30.0000 mg | ORAL_TABLET | Freq: Every day | ORAL | Status: DC

## 2017-10-13 NOTE — Progress Notes (Signed)
  Echocardiogram 2D Echocardiogram has been performed.  Destiny [name] 10/13/2017, 12:42 PM

## 2017-10-13 NOTE — Evaluation (Signed)
Physical Therapy Evaluation Patient Details Name: Destiny [name] MRN: [id number] DOB: 06-09-1931 Today's Date: 10/13/2017   History of Present Illness  Destiny [name] is a 84 y.o. female with medical history significant of CKD, CAD, Dementia, DM, HTN, and prior CVA presenting from PCP for poor PO intake and worsening renal function.  She doesn't know why she is here.  She reports that she is is feeling well.  She has dementia and is unable to provide history; she is unaccompanied.    Clinical Impression  Pt admitted with above diagnosis. Pt currently with functional limitations due to the deficits listed below (see PT Problem List). PTA, pt living alone with caregiver support 5x a week and family checking in daily. Household ambulation without AD, pts son reports 3 falls over last week due to weakness not feeling well. Today, pt presents with cogntiive and balance deficits,and  poor safety awareness. Moving well overall with stand by assistance required for safety due to increased risk of falling. Discussed level of assistance needed with Son who states they would like to have patient home and will work on increasing the level of support she currently has until they can figure out longer term solutions. In meantime, HHPT will be beneficial to work on reconditioning is not quite back to baseline strength.  Pt will benefit from skilled PT to increase their independence and safety with mobility to allow discharge to the venue listed below.       Follow Up Recommendations Home health PT;Supervision/Assistance - 24 hour    Equipment Recommendations  None recommended by PT    Recommendations for Other Services       Precautions / Restrictions Precautions Precautions: Fall Restrictions Weight Bearing Restrictions: No      Mobility  Bed Mobility Overal bed mobility: Needs Assistance Bed Mobility: Supine to Sit     Supine to sit: Supervision        Transfers Overall transfer level:  Needs assistance Equipment used: Rolling walker (2 wheeled) Transfers: Sit to/from Stand Sit to Stand: Supervision         General transfer comment: supervision for safety  Ambulation/Gait Ambulation/Gait assistance: Min guard;Supervision Gait Distance (Feet): 150 Feet Assistive device: Rolling walker (2 wheeled) Gait Pattern/deviations: Step-to pattern Gait velocity: decreased   General Gait Details: pt with unsteady gait,  helped by RW but unsafe to ambualte unsupervised due to poor awareness.   Stairs            Wheelchair Mobility    Modified Rankin (Stroke Patients Only)       Balance Overall balance assessment: Needs assistance   Sitting balance-Leahy Scale: Fair       Standing balance-Leahy Scale: Poor                               Pertinent Vitals/Pain Pain Assessment: No/denies pain    Home Living Family/patient expects to be discharged to:: Private residence Living Arrangements: Alone Available Help at Discharge: Family;Personal care attendant Type of Home: House Home Access: Level entry Entrance Stairs-Rails: None   Home Layout: One level Home Equipment: Environmental consultant - 2 wheels      Prior Function Level of Independence: Needs assistance   Gait / Transfers Assistance Needed: does not use AD  ADL's / Homemaking Assistance Needed: Aide helps with ADLs and iADLs  Comments: Home health aide M-F 4 hrs/day      Hand Dominance   Dominant Hand:  Right    Extremity/Trunk Assessment   Upper Extremity Assessment Upper Extremity Assessment: Overall WFL for tasks assessed    Lower Extremity Assessment Lower Extremity Assessment: Overall WFL for tasks assessed       Communication   Communication: No difficulties  Cognition Arousal/Alertness: Awake/alert Behavior During Therapy: WFL for tasks assessed/performed Overall Cognitive Status: History of cognitive impairments - at baseline                                  General Comments: hx of dementia, pt oriented to person and place, unsure of why she is here or day month or year      General Comments      Exercises     Assessment/Plan    PT Assessment Patient needs continued PT services  PT Problem List Decreased strength;Decreased cognition;Decreased activity tolerance;Decreased safety awareness       PT Treatment Interventions Stair training;DME instruction;Gait training;Functional mobility training;Therapeutic activities;Therapeutic exercise;Balance training    PT Goals (Current goals can be found in the Care Plan section)  Acute Rehab PT Goals Patient Stated Goal: go home PT Goal Formulation: With patient Time For Goal Achievement: 10/20/17 Potential to Achieve Goals: Good    Frequency Min 3X/week   Barriers to discharge Decreased caregiver support(family in process of ramping up support. )      Co-evaluation               AM-PAC PT "6 Clicks" Daily Activity  Outcome Measure Difficulty turning over in bed (including adjusting bedclothes, sheets and blankets)?: A Little Difficulty moving from lying on back to sitting on the side of the bed? : A Little Difficulty sitting down on and standing up from a chair with arms (e.g., wheelchair, bedside commode, etc,.)?: A Little Help needed moving to and from a bed to chair (including a wheelchair)?: A Little Help needed walking in hospital room?: A Little Help needed climbing 3-5 steps with a railing? : A Little 6 Click Score: 18    End of Session Equipment Utilized During Treatment: Gait belt Activity Tolerance: Patient tolerated treatment well Patient left: in bed;with call bell/phone within reach Nurse Communication: Mobility status PT Visit Diagnosis: Unsteadiness on feet (R26.81);Muscle weakness (generalized) (M62.81)    Time: 1540-1610 PT Time Calculation (min) (ACUTE ONLY): 30 min   Charges:   PT Evaluation $PT Eval Moderate Complexity: 1 Mod PT Treatments $Gait  Training: 8-22 mins        Reinaldo Berber, PT, DPT Acute Rehab Services Pager: [phone]    Reinaldo Berber 10/13/2017, 4:22 PM

## 2017-10-13 NOTE — Care Management Obs Status (Signed)
[place] NOTIFICATION   Patient Details  Name: Destiny [name] MRN: [id number] Date of Birth: 1931-08-11   Medicare Observation Status Notification Given:  Yes    Royston Bake, RN 10/13/2017, 3:53 PM

## 2017-10-13 NOTE — Discharge Summary (Signed)
Physician Discharge Summary  [name] IRS:[id number] DOB: [birth year] DOA: 10/12/2017  PCP: Mosie Lukes, MD  Admit date: 10/12/2017 Discharge date: 10/13/2017  Admitted From: Home  Disposition:  Home with home health   Recommendations for Outpatient Follow-up:  1. Follow up with PCP in 1-2 weeks 2. Please obtain BMP/CBC in one week   Home Health: Yes  Equipment/Devices: None  Discharge Condition: Fair  CODE STATUS: DO NOT RESUSCITATE Diet recommendation: Cardiac  Brief/Interim Summary: [name] is an 84 y.o. F with history dementia, community-dwelling, DM, HTN, CAD, CKD and hx CVA who presents with weakness, diarrhea, and wheezing.    In the ER, she was found to have BNP mildly elevated, chest x-ray on admission showed no edema, interpreted to have small bilateral pleural effusions.  Patient was still wheezing slightly, was treated with IV Lasix and TRH were asked to admit for CHF.      Discharge Diagnoses:   Acute on chronic diastolic CHF Presented with pleural effusions, elevated BNP, fatigue with exertion.  Treated with IV Lasix in ER and by the time of arrival to [facility] she was asymptomatic.  This morning, she was at baseline, ambulated with nursing and PT and had improved significantly faster than expected.  Returned to home dose Lasix at discharge.  Echo showed no new WMA, normal EF.  Atrial fibrillation, paroxysmal No change to regimen, warfarin continued.  Diabetes No change to regimen  Chronic kidney disease stage IV Baseline Cr 1.8-2.2.  Creatinine within normal at admission.     Discharge Instructions  Discharge Instructions    Diet - low sodium heart healthy   Complete by:  As directed    Discharge instructions   Complete by:  As directed    From Dr. Loleta Books: You were admitted for weakness and wheezing.   The wheezing went away by the time you arrived from [facility], and did not return this morning. It may have been some congestive  heart failure, that was treated with IV Lasix at [facility]  At this time, you appear at a stable fluid status, not too dry, not too overloaded. Resume your normal fluid intake and normal Lasix use.  Work with home health physical therapy and occupational therapy on strength and conditioning. Follow up with Dr. Randel Pigg in the next 1-2 weeks; I am sure she will agree with you about consultation or transferring care to [facility].   Increase activity slowly   Complete by:  As directed      Allergies as of 10/13/2017   No Known Allergies     Medication List    STOP taking these medications   cefdinir 300 MG capsule Commonly known as:  OMNICEF     TAKE these medications   albuterol 108 (90 Base) MCG/ACT inhaler Commonly known as:  PROAIR HFA Inhale 2 puffs into the lungs every 6 (six) hours as needed for wheezing or shortness of breath.   amLODipine 5 MG tablet Commonly known as:  NORVASC Take 1 tablet (5 mg total) by mouth daily.   aspirin EC 81 MG tablet Take 1 tablet (81 mg total) by mouth daily.   atorvastatin 20 MG tablet Commonly known as:  LIPITOR Take 1 tablet (20 mg total) by mouth daily.   blood glucose meter kit and supplies Kit Dispense based on patient and insurance preference. Use up to four times daily as directed. Dx: E11.9, Z79.4   citalopram 20 MG tablet Commonly known  as:  CELEXA Take 1 tablet (20 mg total) by mouth daily.   ferrous sulfate 325 (65 FE) MG tablet Take 325 mg daily with breakfast by mouth.   Fish Oil 1200 MG Caps Take 1,200 mg by mouth daily.   furosemide 20 MG tablet Commonly known as:  LASIX Take 1 tablet (20 mg total) by mouth daily as needed. Edema or weight >3#/24 hours   insulin glargine 100 UNIT/ML injection Commonly known as:  LANTUS Inject 29m [place] at bedtime What changed:    how much to take  how to take this  when to take this  additional instructions   insulin lispro 100 UNIT/ML  KiwkPen Commonly known as:  HUMALOG KWIKPEN Inject 0.05-0.1 mLs (5-10 Units total) into the skin 3 (three) times daily with meals.   ipratropium 17 MCG/ACT inhaler Commonly known as:  ATROVENT HFA Inhale 2 puffs into the lungs every 6 (six) hours as needed for wheezing.   isosorbide mononitrate 30 MG 24 hr tablet Commonly known as:  IMDUR Take 1 tablet (30 mg total) by mouth at bedtime. What changed:  when to take this   nitroGLYCERIN 0.4 MG SL tablet Commonly known as:  NITROSTAT Place 0.4 mg under the tongue every 5 (five) minutes as needed for chest pain.   pantoprazole 40 MG tablet Commonly known as:  PROTONIX Take 1 tablet (40 mg total) by mouth daily.   ranitidine 150 MG tablet Commonly known as:  ZANTAC Take 150 mg by mouth at bedtime.   thiamine 100 MG tablet Commonly known as:  VITAMIN B-1 Take 1 tablet (100 mg total) by mouth daily. What changed:    when to take this  additional instructions   Vitamin D 1000 units capsule Take 1,000 Units by mouth daily.      Follow-up Information    [facility]   [place]  [place]  They will do your home health care at your home Contact information: [id number]        SMackie Pai PA-C On 10/25/2017.   Specialties:  Internal Medicine, Family Medicine Why:  _0 :45 pm Contact information: [address] [id number]         No Known Allergies  Consultations:  None   Procedures/Studies: Dg Chest 2 View  Result Date: 10/12/2017 CLINICAL DATA:  Shortness of breath and wheezing since last night, former smoker, chronic kidney disease, diabetes mellitus, stroke, hypertension, former smoker EXAM: CHEST - 2 VIEW COMPARISON:  05/21/2017 FINDINGS: Enlargement of cardiac silhouette post CABG. Calcified tortuous thoracic aorta. Pulmonary vascularity normal. Peribronchial thickening with  probable small bibasilar pleural effusions and mild LEFT basilar atelectasis. Underlying emphysematous changes. Upper lungs clear. No pneumothorax or acute osseous findings. Bones appear demineralized. IMPRESSION: Enlargement of cardiac silhouette post CABG. COPD changes with probable small bibasilar pleural effusions and mild LEFT basilar atelectasis. Electronically Signed   By: MLavonia DanaM.D.   On: 10/12/2017 11:24   UKoreaVenous Img Lower Unilateral Left  Result Date: 09/27/2017 CLINICAL DATA:  History of chronic DVT involving the left lower extremity, now with left calf pain and edema. Evaluate for acute or chronic DVT. EXAM: LEFT LOWER EXTREMITY VENOUS DOPPLER ULTRASOUND TECHNIQUE: Gray-scale sonography with graded compression, as well as color Doppler and duplex ultrasound were performed to evaluate the lower extremity deep venous systems from the level of the common femoral vein and including  the common femoral, femoral, profunda femoral, popliteal and calf veins including the posterior tibial, peroneal and gastrocnemius veins when visible. The superficial great saphenous vein was also interrogated. Spectral Doppler was utilized to evaluate flow at rest and with distal augmentation maneuvers in the common femoral, femoral and popliteal veins. COMPARISON:  Left lower extremity venous Doppler ultrasound - 05/21/2017 FINDINGS: Contralateral Common Femoral Vein: Respiratory phasicity is normal and symmetric with the symptomatic side. No evidence of thrombus. Normal compressibility. Common Femoral Vein: No evidence of thrombus. Normal compressibility, respiratory phasicity and response to augmentation. Saphenofemoral Junction: No evidence of thrombus. Normal compressibility and flow on color Doppler imaging. Profunda Femoral Vein: No evidence of thrombus. Normal compressibility and flow on color Doppler imaging. Femoral Vein: No evidence of thrombus. Normal compressibility, respiratory phasicity and response to  augmentation. Popliteal Vein: No evidence of thrombus. Normal compressibility, respiratory phasicity and response to augmentation. Calf Veins: No evidence of acute or chronic thrombus. Normal compressibility and flow on color Doppler imaging. Superficial Great Saphenous Vein: No evidence of thrombus. Normal compressibility. Venous Reflux:  None. Other Findings: No evidence of acute or chronic DVT within the left lesser saphenous vein. A moderate amount of subcutaneous edema is noted at the level the left ankle (image 36). Note is made of an approximately 3.0 x 2.0 x 2.7 cm serpiginous fluid collection with the left popliteal fossa favored to represent a Baker cyst, grossly unchanged compared to the 05/2017 examination. IMPRESSION: 1. No evidence of acute or chronic DVT or SVT within the left lower extremity. 2. Grossly unchanged approximately 3.0 cm serpiginous left-sided Baker cyst. Electronically Signed   By: Sandi Mariscal M.D.   On: 09/27/2017 12:44   Echocardiogram 7/31 Study Conclusions  - Left ventricle: The cavity size was normal. Wall thickness was   increased in a pattern of mild LVH. Systolic function was normal.   The estimated ejection fraction was in the range of 55% to 60%.   Wall motion was normal; there were no regional wall motion   abnormalities. Doppler parameters are consistent with abnormal   left ventricular relaxation (grade 1 diastolic dysfunction). - Aortic valve: Trileaflet; moderately thickened, moderately   calcified leaflets. Valve mobility was restricted. - Mitral valve: Calcified annulus. There was mild regurgitation. - Left atrium: The atrium was moderately dilated. - Right atrium: The atrium was mildly dilated. - Pulmonary arteries: Systolic pressure was mildly increased. PA   peak pressure: 39 mm Hg (S).    Subjective: Feeling well.  No chest pain, dyspnea, cough, orthopnea, leg swelling.  No confusion.   Discharge Exam: Vitals:   10/13/17 1228 10/13/17 1526   BP: (!) 164/59 (!) 142/59  Pulse: (!) 54 (!) 58  Resp: 16 20  Temp: 99.4 F (37.4 C) 98.4 F (36.9 C)  SpO2: 97% 100%   Vitals:   10/13/17 0652 10/13/17 0957 10/13/17 1228 10/13/17 1526  BP: (!) 186/57 (!) 159/57 (!) 164/59 (!) 142/59  Pulse: (!) 52 (!) 51 (!) 54 (!) 58  Resp: _0 Temp: 98.8 F (37.1 C)  99.4 F (37.4 C) 98.4 F (36.9 C)  TempSrc: Oral  Oral Oral  SpO2: 93% 95% 97% 100%  Weight:      Height:        General: Pt is alert, awake, not in acute distress, pleasant, sitting up in bed Cardiovascular: RRR, S1/S2 +, no rubs, no gallops Respiratory: CTA bilaterally, no wheezing, no rhonchi Abdominal: Soft, NT, ND, bowel sounds + Extremities: no  edema, no cyanosis    The results of significant diagnostics from this hospitalization (including imaging, microbiology, ancillary and laboratory) are listed below for reference.     Microbiology: Recent Results (from the past 240 hour(s))  Urine culture     Status: None   Collection Time: 10/12/17 12:22 PM  Result Value Ref Range Status   Specimen Description   Final    URINE, CLEAN CATCH Performed at [facility], [address] [place], [place] [address]    Special Requests   Final    Normal Performed at [facility], [address] [place], [place] [address]    Culture   Final    NO GROWTH Performed at [facility], [place] [address] [place], [place] [address]    Report Status 10/13/2017 FINAL  Final     Labs: BNP (last 3 results) Recent Labs    10/12/17 1057  BNP 010.9*   Basic Metabolic Panel: Recent Labs  Lab 10/12/17 1057 10/13/17 0602  NA 137 140  K 4.0 3.5  CL 104 106  CO2 22 24  GLUCOSE 186* 91  BUN 35* 31*  CREATININE 1.89* 1.90*  CALCIUM 9.0 9.1   Liver Function Tests: Recent Labs  Lab 10/12/17 1057  AST 28  ALT 29  ALKPHOS 75  BILITOT 0.8  PROT 7.5  ALBUMIN 3.2*   No results for input(s): LIPASE, AMYLASE in the last 168 hours. No  results for input(s): AMMONIA in the last 168 hours. CBC: Recent Labs  Lab 10/12/17 1057 10/13/17 0602  WBC 10.9* 7.7  NEUTROABS 7.4 4.8  HGB 12.2 11.2*  HCT 35.3* 32.7*  MCV 89.4 88.9  PLT 303 272   Cardiac Enzymes: Recent Labs  Lab 10/12/17 1057  TROPONINI <0.03   BNP: Invalid input(s): POCBNP CBG: Recent Labs  Lab 10/12/17 1611 10/12/17 2256 10/13/17 0802 10/13/17 1139 10/13/17 1521  GLUCAP 111* 152* 92 145* 198*   D-Dimer No results for input(s): DDIMER in the last 72 hours. Hgb A1c No results for input(s): HGBA1C in the last 72 hours. Lipid Profile No results for input(s): CHOL, HDL, LDLCALC, TRIG, CHOLHDL, LDLDIRECT in the last 72 hours. Thyroid function studies No results for input(s): TSH, T4TOTAL, T3FREE, THYROIDAB in the last 72 hours.  Invalid input(s): FREET3 Anemia work up No results for input(s): VITAMINB12, FOLATE, FERRITIN, TIBC, IRON, RETICCTPCT in the last 72 hours. Urinalysis    Component Value Date/Time   COLORURINE STRAW (A) 06/09/2017 1915   APPEARANCEUR CLEAR 06/09/2017 1915   LABSPEC 1.008 06/09/2017 1915   PHURINE 5.0 06/09/2017 1915   GLUCOSEU 50 (A) 06/09/2017 1915   GLUCOSEU 100 (A) 01/26/2017 1114   HGBUR NEGATIVE 06/09/2017 1915   HGBUR large 04/26/2009 [place] 06/09/2017 1915   BILIRUBINUR Negative 05/21/2017 [place] 06/09/2017 1915   PROTEINUR 100 (A) 06/09/2017 1915   UROBILINOGEN negative (A) 05/21/2017 1512   UROBILINOGEN 0.2 01/26/2017 1114   NITRITE NEGATIVE 06/09/2017 1915   LEUKOCYTESUR NEGATIVE 06/09/2017 1915   Sepsis Labs Invalid input(s): PROCALCITONIN,  WBC,  LACTICIDVEN Microbiology Recent Results (from the past 240 hour(s))  Urine culture     Status: None   Collection Time: 10/12/17 12:22 PM  Result Value Ref Range Status   Specimen Description   Final    URINE, CLEAN CATCH Performed at [facility], [address] [place], [place] [address]     Special Requests  Final    Normal Performed at [facility], [address] [place], [place] [address]    Culture   Final    NO GROWTH Performed at [facility], [place] [address] [place], [place] [address]    Report Status 10/13/2017 FINAL  Final     Time coordinating discharge: 25 minutes       SIGNED:   Edwin Dada, MD  [facility] 10/13/2017, 7:42 PM

## 2017-10-13 NOTE — Care Management CC44 (Signed)
Condition Code 44 Documentation Completed  Patient Details  Name: HANEEFAH VENTURINI MRN: [id number] Date of Birth: 01/17/1932   Condition Code 44 given:  Yes Patient signature on Condition Code 44 notice:  Yes Documentation of 2 MD's agreement:  Yes Code 44 added to claim:  Yes    Royston Bake, RN 10/13/2017, 3:53 PM

## 2017-10-13 NOTE — Care Management Note (Signed)
Case Management Note  Patient Details  Name: Destiny [name] MRN: [id number] Date of Birth: Jan 02, 1932  Subjective/Objective:    CHF               Action/Plan: CM talked to patient with her sister at the bedside;PCP: Mosie Lukes, MD; has private insurance with [facility] with prescription drug coverage; [place] choice offered, pt chose [facility]; CM called Dan with [facility], they cannot provide the [facility]; patient's second choice is [facility]; [place] with [facility] called for arrangements.  Expected Discharge Date:  10/13/17               Expected Discharge Plan:  [place]  Discharge planning Services  CM Consult     Choice offered to:  Patient, Sibling  HH Arranged:  RN, Disease Management, PT, OT [place] Agency:  [facility]  Status of Service:  In process, will continue to follow  Sherrilyn Rist [phone] 10/13/2017, 4:13 PM

## 2017-10-13 NOTE — Progress Notes (Signed)
Patient's BP is 186/57. Paged oncall to inform of elevated BP and that she doesn't have a PRN BP medication ordered. Awaiting response.

## 2017-10-13 NOTE — Progress Notes (Signed)
Pt discharge education and instructions completed with pt and son at bedside; both voices understanding and denies any questions. Pt IV and telemetry removed; pt discharge home with son to transport her home. Pt transported off unit via wheelchair with belongings and son to the side. Delia Heady RN

## 2017-10-14 ENCOUNTER — Telehealth: Payer: Self-pay

## 2017-10-14 NOTE — Telephone Encounter (Signed)
10/14/17   Transition Care Management Follow-up Telephone Call  ADMISSION DATE: 10/12/17  DISCHARGE DATE: 10/13/17   How have you been since you were released from the hospital? Patient is doing well per son.   Do you understand why you were in the hospital? Yes   Do you understand the discharge instrcutions? Yes    Items Reviewed:  Medications reviewed: Yes  Allergies reviewed: NKDA  Dietary changes reviewed: Low sodium, Heart healthy  Referrals reviewed: Appointment ascheduled with E. Saguier, PA-C   Functional Questionnaire:   Activities of Daily Living (ADLs):   Any patient concerns?No  Confirmed importance and date/time of follow-up visits scheduled:Yes  Confirmed with patient if condition begins to worsen call PCP or go to the ER. Yes   Patient was given the office number and encouragred to call back with questions or concerns.Yes

## 2017-10-18 ENCOUNTER — Ambulatory Visit: Payer: Medicare Other | Admitting: Family Medicine

## 2017-10-18 DIAGNOSIS — E538 Deficiency of other specified B group vitamins: Secondary | ICD-10-CM | POA: Diagnosis not present

## 2017-10-18 DIAGNOSIS — I480 Paroxysmal atrial fibrillation: Secondary | ICD-10-CM | POA: Diagnosis not present

## 2017-10-18 DIAGNOSIS — K5790 Diverticulosis of intestine, part unspecified, without perforation or abscess without bleeding: Secondary | ICD-10-CM | POA: Diagnosis not present

## 2017-10-18 DIAGNOSIS — I130 Hypertensive heart and chronic kidney disease with heart failure and stage 1 through stage 4 chronic kidney disease, or unspecified chronic kidney disease: Secondary | ICD-10-CM | POA: Diagnosis not present

## 2017-10-18 DIAGNOSIS — I2510 Atherosclerotic heart disease of native coronary artery without angina pectoris: Secondary | ICD-10-CM | POA: Diagnosis not present

## 2017-10-18 DIAGNOSIS — E1151 Type 2 diabetes mellitus with diabetic peripheral angiopathy without gangrene: Secondary | ICD-10-CM | POA: Diagnosis not present

## 2017-10-18 DIAGNOSIS — D631 Anemia in chronic kidney disease: Secondary | ICD-10-CM | POA: Diagnosis not present

## 2017-10-18 DIAGNOSIS — E1122 Type 2 diabetes mellitus with diabetic chronic kidney disease: Secondary | ICD-10-CM | POA: Diagnosis not present

## 2017-10-18 DIAGNOSIS — E1142 Type 2 diabetes mellitus with diabetic polyneuropathy: Secondary | ICD-10-CM | POA: Diagnosis not present

## 2017-10-18 DIAGNOSIS — I5033 Acute on chronic diastolic (congestive) heart failure: Secondary | ICD-10-CM | POA: Diagnosis not present

## 2017-10-18 DIAGNOSIS — N184 Chronic kidney disease, stage 4 (severe): Secondary | ICD-10-CM | POA: Diagnosis not present

## 2017-10-19 ENCOUNTER — Telehealth: Payer: Self-pay | Admitting: Family Medicine

## 2017-10-19 NOTE — Telephone Encounter (Signed)
Copied from [place] [id number]. Topic: Quick Communication - See Telephone Encounter >> Oct 19, 2017 10:09 AM Conception Chancy, NT wrote: CRM for notification. See Telephone encounter for: 10/19/17.  Gennaro Africa is calling and she is a physical therapist with [facility], she is requesting pt verbal orders for 2x a week for 8 weeks and 1x a week for 1 week.  Cb# [phone]

## 2017-10-20 NOTE — Telephone Encounter (Signed)
Returned call from [employer] (PT) w/[facility]. Approval given for verbal orders requested in previous message.

## 2017-10-21 DIAGNOSIS — I130 Hypertensive heart and chronic kidney disease with heart failure and stage 1 through stage 4 chronic kidney disease, or unspecified chronic kidney disease: Secondary | ICD-10-CM | POA: Diagnosis not present

## 2017-10-21 DIAGNOSIS — I480 Paroxysmal atrial fibrillation: Secondary | ICD-10-CM | POA: Diagnosis not present

## 2017-10-21 DIAGNOSIS — E1151 Type 2 diabetes mellitus with diabetic peripheral angiopathy without gangrene: Secondary | ICD-10-CM | POA: Diagnosis not present

## 2017-10-21 DIAGNOSIS — N184 Chronic kidney disease, stage 4 (severe): Secondary | ICD-10-CM | POA: Diagnosis not present

## 2017-10-21 DIAGNOSIS — K5790 Diverticulosis of intestine, part unspecified, without perforation or abscess without bleeding: Secondary | ICD-10-CM | POA: Diagnosis not present

## 2017-10-21 DIAGNOSIS — I2510 Atherosclerotic heart disease of native coronary artery without angina pectoris: Secondary | ICD-10-CM | POA: Diagnosis not present

## 2017-10-21 DIAGNOSIS — I5033 Acute on chronic diastolic (congestive) heart failure: Secondary | ICD-10-CM | POA: Diagnosis not present

## 2017-10-21 DIAGNOSIS — E538 Deficiency of other specified B group vitamins: Secondary | ICD-10-CM | POA: Diagnosis not present

## 2017-10-21 DIAGNOSIS — E1142 Type 2 diabetes mellitus with diabetic polyneuropathy: Secondary | ICD-10-CM | POA: Diagnosis not present

## 2017-10-21 DIAGNOSIS — D631 Anemia in chronic kidney disease: Secondary | ICD-10-CM | POA: Diagnosis not present

## 2017-10-21 DIAGNOSIS — E1122 Type 2 diabetes mellitus with diabetic chronic kidney disease: Secondary | ICD-10-CM | POA: Diagnosis not present

## 2017-10-22 DIAGNOSIS — E1122 Type 2 diabetes mellitus with diabetic chronic kidney disease: Secondary | ICD-10-CM | POA: Diagnosis not present

## 2017-10-22 DIAGNOSIS — K5790 Diverticulosis of intestine, part unspecified, without perforation or abscess without bleeding: Secondary | ICD-10-CM | POA: Diagnosis not present

## 2017-10-22 DIAGNOSIS — I130 Hypertensive heart and chronic kidney disease with heart failure and stage 1 through stage 4 chronic kidney disease, or unspecified chronic kidney disease: Secondary | ICD-10-CM | POA: Diagnosis not present

## 2017-10-22 DIAGNOSIS — D631 Anemia in chronic kidney disease: Secondary | ICD-10-CM | POA: Diagnosis not present

## 2017-10-22 DIAGNOSIS — N184 Chronic kidney disease, stage 4 (severe): Secondary | ICD-10-CM | POA: Diagnosis not present

## 2017-10-22 DIAGNOSIS — I2510 Atherosclerotic heart disease of native coronary artery without angina pectoris: Secondary | ICD-10-CM | POA: Diagnosis not present

## 2017-10-22 DIAGNOSIS — I480 Paroxysmal atrial fibrillation: Secondary | ICD-10-CM | POA: Diagnosis not present

## 2017-10-22 DIAGNOSIS — I5033 Acute on chronic diastolic (congestive) heart failure: Secondary | ICD-10-CM | POA: Diagnosis not present

## 2017-10-22 DIAGNOSIS — E1151 Type 2 diabetes mellitus with diabetic peripheral angiopathy without gangrene: Secondary | ICD-10-CM | POA: Diagnosis not present

## 2017-10-22 DIAGNOSIS — E538 Deficiency of other specified B group vitamins: Secondary | ICD-10-CM | POA: Diagnosis not present

## 2017-10-22 DIAGNOSIS — E1142 Type 2 diabetes mellitus with diabetic polyneuropathy: Secondary | ICD-10-CM | POA: Diagnosis not present

## 2017-10-25 ENCOUNTER — Telehealth: Payer: Self-pay | Admitting: Medical

## 2017-10-25 ENCOUNTER — Other Ambulatory Visit: Payer: Self-pay | Admitting: Family Medicine

## 2017-10-25 ENCOUNTER — Inpatient Hospital Stay: Payer: Medicare Other | Admitting: Medical

## 2017-10-25 NOTE — Telephone Encounter (Signed)
Pt missed follow up from hospitalization today. You could offer follow up again or at least repeat cbc and cmp labs. This were discharge summary lab follow up recommendations. If they are willing to come at least for labs. Actual office might be better.  Let me know and I can put in future labs then you could get them on lab schedule.

## 2017-10-26 DIAGNOSIS — I480 Paroxysmal atrial fibrillation: Secondary | ICD-10-CM | POA: Diagnosis not present

## 2017-10-26 DIAGNOSIS — E538 Deficiency of other specified B group vitamins: Secondary | ICD-10-CM | POA: Diagnosis not present

## 2017-10-26 DIAGNOSIS — E1122 Type 2 diabetes mellitus with diabetic chronic kidney disease: Secondary | ICD-10-CM | POA: Diagnosis not present

## 2017-10-26 DIAGNOSIS — I2510 Atherosclerotic heart disease of native coronary artery without angina pectoris: Secondary | ICD-10-CM | POA: Diagnosis not present

## 2017-10-26 DIAGNOSIS — E1151 Type 2 diabetes mellitus with diabetic peripheral angiopathy without gangrene: Secondary | ICD-10-CM | POA: Diagnosis not present

## 2017-10-26 DIAGNOSIS — D631 Anemia in chronic kidney disease: Secondary | ICD-10-CM | POA: Diagnosis not present

## 2017-10-26 DIAGNOSIS — I130 Hypertensive heart and chronic kidney disease with heart failure and stage 1 through stage 4 chronic kidney disease, or unspecified chronic kidney disease: Secondary | ICD-10-CM | POA: Diagnosis not present

## 2017-10-26 DIAGNOSIS — I5033 Acute on chronic diastolic (congestive) heart failure: Secondary | ICD-10-CM | POA: Diagnosis not present

## 2017-10-26 DIAGNOSIS — E1142 Type 2 diabetes mellitus with diabetic polyneuropathy: Secondary | ICD-10-CM | POA: Diagnosis not present

## 2017-10-26 DIAGNOSIS — K5790 Diverticulosis of intestine, part unspecified, without perforation or abscess without bleeding: Secondary | ICD-10-CM | POA: Diagnosis not present

## 2017-10-26 DIAGNOSIS — N184 Chronic kidney disease, stage 4 (severe): Secondary | ICD-10-CM | POA: Diagnosis not present

## 2017-10-27 NOTE — Telephone Encounter (Signed)
Called pt spoke with son he said she was not doing well and almost not mobile. He states he will try to get her here to see Percell Miller at 11:00am tomorrow. I told him to please call us if he don't think she will be able to make it.

## 2017-10-28 ENCOUNTER — Ambulatory Visit (HOSPITAL_BASED_OUTPATIENT_CLINIC_OR_DEPARTMENT_OTHER)
Admission: RE | Admit: 2017-10-28 | Discharge: 2017-10-28 | Disposition: A | Discharge status: Home / Self Care | Payer: Medicare Other | Source: Ambulatory Visit | Attending: Medical | Admitting: Medical

## 2017-10-28 ENCOUNTER — Encounter: Payer: Self-pay | Admitting: Medical

## 2017-10-28 ENCOUNTER — Ambulatory Visit (INDEPENDENT_AMBULATORY_CARE_PROVIDER_SITE_OTHER): Payer: Medicare Other | Admitting: Medical

## 2017-10-28 ENCOUNTER — Other Ambulatory Visit (INDEPENDENT_AMBULATORY_CARE_PROVIDER_SITE_OTHER): Payer: Medicare Other

## 2017-10-28 VITALS — BP 160/56 | HR 64 | Temp 98.3°F | Resp 16 | Ht 67.0 in | Wt 153.0 lb

## 2017-10-28 DIAGNOSIS — R5383 Other fatigue: Secondary | ICD-10-CM | POA: Diagnosis not present

## 2017-10-28 DIAGNOSIS — N289 Disorder of kidney and ureter, unspecified: Secondary | ICD-10-CM | POA: Diagnosis not present

## 2017-10-28 DIAGNOSIS — R55 Syncope and collapse: Secondary | ICD-10-CM | POA: Insufficient documentation

## 2017-10-28 DIAGNOSIS — Z87440 Personal history of urinary (tract) infections: Secondary | ICD-10-CM | POA: Diagnosis not present

## 2017-10-28 DIAGNOSIS — R4182 Altered mental status, unspecified: Secondary | ICD-10-CM

## 2017-10-28 DIAGNOSIS — I6782 Cerebral ischemia: Secondary | ICD-10-CM | POA: Diagnosis not present

## 2017-10-28 DIAGNOSIS — W19XXXA Unspecified fall, initial encounter: Secondary | ICD-10-CM | POA: Insufficient documentation

## 2017-10-28 DIAGNOSIS — S0990XA Unspecified injury of head, initial encounter: Secondary | ICD-10-CM | POA: Diagnosis not present

## 2017-10-28 LAB — COMPREHENSIVE METABOLIC PANEL
ALT: 13 U/L (ref 0–35)
AST: 15 U/L (ref 0–37)
Albumin: 3.8 g/dL (ref 3.5–5.2)
Alkaline Phosphatase: 72 U/L (ref 39–117)
BUN: 27 mg/dL — AB (ref 6–23)
CHLORIDE: 100 meq/L (ref 96–112)
CO2: 28 mEq/L (ref 19–32)
Calcium: 9.7 mg/dL (ref 8.4–10.5)
Creatinine, Ser: 1.84 mg/dL — ABNORMAL HIGH (ref 0.40–1.20)
GFR: 27.74 mL/min — AB (ref >60.00)
GLUCOSE: 239 mg/dL — AB (ref 70–99)
POTASSIUM: 4.2 meq/L (ref 3.5–5.1)
SODIUM: 136 meq/L (ref 135–145)
Total Bilirubin: 0.9 mg/dL (ref 0.2–1.2)
Total Protein: 7.4 g/dL (ref 6.0–8.3)

## 2017-10-28 LAB — CBC WITH DIFFERENTIAL/PLATELET
BASOS PCT: 0.6 % (ref 0.0–3.0)
Basophils Absolute: 0.1 10*3/uL (ref 0.0–0.1)
EOS PCT: 3.8 % (ref 0.0–5.0)
Eosinophils Absolute: 0.3 10*3/uL (ref 0.0–0.7)
HCT: 38.2 % (ref 36.0–46.0)
Hemoglobin: 12.7 g/dL (ref 12.0–15.0)
LYMPHS ABS: 2 10*3/uL (ref 0.7–4.0)
Lymphocytes Relative: 24 % (ref 12.0–46.0)
MCHC: 33.3 g/dL (ref 30.0–36.0)
MCV: 90 fl (ref 78.0–100.0)
MONO ABS: 0.7 10*3/uL (ref 0.1–1.0)
MONOS PCT: 8.3 % (ref 3.0–12.0)
NEUTROS ABS: 5.3 10*3/uL (ref 1.4–7.7)
NEUTROS PCT: 63.3 % (ref 43.0–77.0)
Platelets: 209 10*3/uL (ref 150.0–400.0)
RBC: 4.25 Mil/uL (ref 3.87–5.11)
RDW: 14.5 % (ref 11.5–15.5)
WBC: 8.4 10*3/uL (ref 4.0–10.5)

## 2017-10-28 LAB — POC URINALSYSI DIPSTICK (AUTOMATED)
BILIRUBIN UA: NEGATIVE
GLUCOSE UA: NEGATIVE
KETONES UA: NEGATIVE
Nitrite, UA: NEGATIVE
Protein, UA: POSITIVE — AB
SPEC GRAV UA: 1.015 (ref 1.010–1.025)
Urobilinogen, UA: 0.2 E.U./dL
pH, UA: 7 (ref 5.0–8.0)

## 2017-10-28 LAB — TROPONIN I: TNIDX: 0.01 ug/L (ref 0.00–0.06)

## 2017-10-28 NOTE — Progress Notes (Signed)
Urine looks suspicious but no obvious symptoms of uti. So will wait for culture and then decide if needs antibiotic.

## 2017-10-28 NOTE — Progress Notes (Signed)
Subjective:    Patient ID: Destiny [name], female    DOB: 12-30-1931, 84 y.o.   MRN: [id number]  HPI  Pt in for follow up from the hospital. She had some severe confusion on Tuesday and sleeping a lot on Tuesday. Pt daughter has been trying to wake her up every 30 minutes to make her drink. Pt has been doing OT and PT. This seems to wear her out.  She fell on hard wood floor. On camera that son has set up she was found on the floor. She was confused at that time. She had slurred speech at that time and she was confused. Could not do finger to nose at that time. Pt does not remember the fall nor the cirumstances behind the fall.   Pt did urinate well today but for 3 days she did not urinate. Her diaper was dry for 3 days. But very wet this morning.  Pt was admitted on 10/12/2017.   For mid chf flair for which she revered quickly with iv lasix.She does have some renal insufficiency as well. She was discharge in stable condition.  DC summary notes.  The wheezing went away by the time you arrived from [facility], and did not return this morning. It may have been some congestive heart failure, that was treated with IV Lasix at [facility].  At this time, you appear at a stable fluid status, not too dry, not too overloaded. Resume your normal fluid intake and normal Lasix use.  Work with home health physical therapy and occupational therapy on strength and conditioning. Follow up with Dr. Randel Pigg in the next 1-2 weeks; I am sure she will agree with you about consultation or transferring care to [facility].   Increase activity slowly       Pt has been little off balance since discharge from hospital. PCP mentioned she has had poor balance for a while but she refuses to use walker.     Review of Systems  Constitutional: Negative for chills, fatigue and fever.  Respiratory: Negative for apnea, choking and wheezing.   Cardiovascular: Negative for chest  pain and palpitations.  Gastrointestinal: Negative for abdominal pain.  Skin: Negative for rash.  Neurological: Negative for dizziness, syncope, speech difficulty, weakness, numbness and headaches.  Hematological: Negative for adenopathy. Does not bruise/bleed easily.  Psychiatric/Behavioral: Positive for confusion. Negative for behavioral problems, hallucinations, sleep disturbance and suicidal ideas. The patient is not nervous/anxious.        Baseline confused.       Objective:   Physical Exam  General Mental Status- Alert. General Appearance- Not in acute distress.   Skin General: Color- Normal Color. Moisture- Normal Moisture.  Neck Carotid Arteries- Normal color. Moisture- Normal Moisture. No carotid bruits. No JVD.  Chest and Lung Exam Auscultation: Breath Sounds:-Normal.  Cardiovascular Auscultation:Rythm- Regular. Murmurs & Other Heart Sounds:Auscultation of the heart reveals- No Murmurs.  Abdomen Inspection:-Inspeection Normal. Palpation/Percussion:Note:No mass. Palpation and Percussion of the abdomen reveal- Non Tender, Non Distended + BS, no rebound or guarding.    Neurologic Cranial Nerve exam:- CN III-XII intact(No nystagmus), symmetric smile. Strength:- 5/5 equal and symmetric strength both upper and lower extremities.      Assessment & Plan:  For your recent fall and potential syncope, I placed ct of head order to be done stat. Please get that done today and wait down stairs for the result.  For ams/likely demenitia but will get urine culture to make  sure no infection.  Get cmp and cbc stat.  Continue with PT and OT  Will send pcp note of this visit. Did talk with Dr. [name] and she agreed with plan.  Follow up 3 weeks Dr. [name] or as needed  Also decided to get troponin stat. As cause from remote unwitnessed fall not clear?  Mackie Pai, PA-C

## 2017-10-28 NOTE — Patient Instructions (Addendum)
For your recent fall and potential syncope, I placed ct of head order to be done stat(but scheduled at 4 pm due to work conflict per daughter also 3 ED patients before [facility]). Please get that done today and wait down stairs for the result.  For ams/likely demeniia but will get urine culture to make sure no infection.  Get cmp and cbc stat.  Continue with PT and OT  Will send pcp note of this visit. Did talk with Dr. [name] and she agreed with plan.  Follow up 3 weeks Dr. [name] or as needed

## 2017-10-29 ENCOUNTER — Other Ambulatory Visit: Payer: Self-pay | Admitting: Family Medicine

## 2017-10-29 DIAGNOSIS — E1142 Type 2 diabetes mellitus with diabetic polyneuropathy: Secondary | ICD-10-CM | POA: Diagnosis not present

## 2017-10-29 DIAGNOSIS — E1122 Type 2 diabetes mellitus with diabetic chronic kidney disease: Secondary | ICD-10-CM | POA: Diagnosis not present

## 2017-10-29 DIAGNOSIS — I2510 Atherosclerotic heart disease of native coronary artery without angina pectoris: Secondary | ICD-10-CM | POA: Diagnosis not present

## 2017-10-29 DIAGNOSIS — I480 Paroxysmal atrial fibrillation: Secondary | ICD-10-CM | POA: Diagnosis not present

## 2017-10-29 DIAGNOSIS — I5033 Acute on chronic diastolic (congestive) heart failure: Secondary | ICD-10-CM | POA: Diagnosis not present

## 2017-10-29 DIAGNOSIS — E1151 Type 2 diabetes mellitus with diabetic peripheral angiopathy without gangrene: Secondary | ICD-10-CM | POA: Diagnosis not present

## 2017-10-29 DIAGNOSIS — I130 Hypertensive heart and chronic kidney disease with heart failure and stage 1 through stage 4 chronic kidney disease, or unspecified chronic kidney disease: Secondary | ICD-10-CM | POA: Diagnosis not present

## 2017-10-29 DIAGNOSIS — D631 Anemia in chronic kidney disease: Secondary | ICD-10-CM | POA: Diagnosis not present

## 2017-10-29 DIAGNOSIS — E538 Deficiency of other specified B group vitamins: Secondary | ICD-10-CM | POA: Diagnosis not present

## 2017-10-29 DIAGNOSIS — K5790 Diverticulosis of intestine, part unspecified, without perforation or abscess without bleeding: Secondary | ICD-10-CM | POA: Diagnosis not present

## 2017-10-29 DIAGNOSIS — N184 Chronic kidney disease, stage 4 (severe): Secondary | ICD-10-CM | POA: Diagnosis not present

## 2017-10-30 ENCOUNTER — Telehealth: Payer: Self-pay | Admitting: Medical

## 2017-10-30 LAB — URINE CULTURE
MICRO NUMBER:: 90971063
SPECIMEN QUALITY:: ADEQUATE

## 2017-10-30 MED ORDER — CEFDINIR 300 MG PO CAPS: 300.0000 mg | ORAL_CAPSULE | Freq: Two times a day (BID) | ORAL | 0 refills | Status: DC

## 2017-10-30 NOTE — Telephone Encounter (Signed)
rx cefdnir sent to pt pharmacy.

## 2017-10-31 ENCOUNTER — Encounter (HOSPITAL_COMMUNITY): Payer: Self-pay | Admitting: Family Medicine

## 2017-10-31 ENCOUNTER — Other Ambulatory Visit: Payer: Self-pay

## 2017-10-31 ENCOUNTER — Emergency Department (HOSPITAL_COMMUNITY): Payer: Medicare Other

## 2017-10-31 ENCOUNTER — Observation Stay (HOSPITAL_COMMUNITY)
Admission: EM | Admit: 2017-10-31 | Discharge: 2017-11-05 | Disposition: A | Discharge status: Home / Self Care | Payer: Medicare Other | Attending: Family Medicine | Admitting: Family Medicine

## 2017-10-31 DIAGNOSIS — Z79899 Other long term (current) drug therapy: Secondary | ICD-10-CM | POA: Diagnosis not present

## 2017-10-31 DIAGNOSIS — I5032 Chronic diastolic (congestive) heart failure: Secondary | ICD-10-CM | POA: Diagnosis not present

## 2017-10-31 DIAGNOSIS — I10 Essential (primary) hypertension: Secondary | ICD-10-CM | POA: Diagnosis not present

## 2017-10-31 DIAGNOSIS — R296 Repeated falls: Secondary | ICD-10-CM | POA: Diagnosis not present

## 2017-10-31 DIAGNOSIS — F0390 Unspecified dementia without behavioral disturbance: Secondary | ICD-10-CM | POA: Diagnosis not present

## 2017-10-31 DIAGNOSIS — Z7982 Long term (current) use of aspirin: Secondary | ICD-10-CM | POA: Insufficient documentation

## 2017-10-31 DIAGNOSIS — R4182 Altered mental status, unspecified: Secondary | ICD-10-CM | POA: Diagnosis present

## 2017-10-31 DIAGNOSIS — R4781 Slurred speech: Secondary | ICD-10-CM | POA: Diagnosis not present

## 2017-10-31 DIAGNOSIS — R1310 Dysphagia, unspecified: Secondary | ICD-10-CM | POA: Insufficient documentation

## 2017-10-31 DIAGNOSIS — N184 Chronic kidney disease, stage 4 (severe): Secondary | ICD-10-CM | POA: Diagnosis not present

## 2017-10-31 DIAGNOSIS — F0391 Unspecified dementia with behavioral disturbance: Secondary | ICD-10-CM

## 2017-10-31 DIAGNOSIS — G459 Transient cerebral ischemic attack, unspecified: Secondary | ICD-10-CM | POA: Diagnosis not present

## 2017-10-31 DIAGNOSIS — I6522 Occlusion and stenosis of left carotid artery: Secondary | ICD-10-CM | POA: Insufficient documentation

## 2017-10-31 DIAGNOSIS — G92 Toxic encephalopathy: Secondary | ICD-10-CM

## 2017-10-31 DIAGNOSIS — I130 Hypertensive heart and chronic kidney disease with heart failure and stage 1 through stage 4 chronic kidney disease, or unspecified chronic kidney disease: Secondary | ICD-10-CM | POA: Diagnosis not present

## 2017-10-31 DIAGNOSIS — E119 Type 2 diabetes mellitus without complications: Secondary | ICD-10-CM

## 2017-10-31 DIAGNOSIS — G9340 Encephalopathy, unspecified: Secondary | ICD-10-CM | POA: Diagnosis not present

## 2017-10-31 DIAGNOSIS — Z794 Long term (current) use of insulin: Secondary | ICD-10-CM | POA: Diagnosis not present

## 2017-10-31 DIAGNOSIS — I499 Cardiac arrhythmia, unspecified: Secondary | ICD-10-CM | POA: Diagnosis not present

## 2017-10-31 DIAGNOSIS — I2510 Atherosclerotic heart disease of native coronary artery without angina pectoris: Secondary | ICD-10-CM | POA: Insufficient documentation

## 2017-10-31 DIAGNOSIS — Z8673 Personal history of transient ischemic attack (TIA), and cerebral infarction without residual deficits: Secondary | ICD-10-CM

## 2017-10-31 DIAGNOSIS — Z87891 Personal history of nicotine dependence: Secondary | ICD-10-CM | POA: Diagnosis not present

## 2017-10-31 DIAGNOSIS — I480 Paroxysmal atrial fibrillation: Secondary | ICD-10-CM | POA: Diagnosis not present

## 2017-10-31 DIAGNOSIS — I25810 Atherosclerosis of coronary artery bypass graft(s) without angina pectoris: Secondary | ICD-10-CM | POA: Diagnosis not present

## 2017-10-31 DIAGNOSIS — Z951 Presence of aortocoronary bypass graft: Secondary | ICD-10-CM | POA: Diagnosis not present

## 2017-10-31 DIAGNOSIS — I639 Cerebral infarction, unspecified: Secondary | ICD-10-CM | POA: Diagnosis not present

## 2017-10-31 DIAGNOSIS — R404 Transient alteration of awareness: Secondary | ICD-10-CM | POA: Diagnosis not present

## 2017-10-31 LAB — COMPREHENSIVE METABOLIC PANEL
ALK PHOS: 64 U/L (ref 38–126)
ALT: 13 U/L (ref 0–44)
AST: 16 U/L (ref 15–41)
Albumin: 3.3 g/dL — ABNORMAL LOW (ref 3.5–5.0)
Anion gap: 8 (ref 5–15)
BUN: 24 mg/dL — AB (ref 8–23)
CALCIUM: 9.2 mg/dL (ref 8.9–10.3)
CHLORIDE: 104 mmol/L (ref 98–111)
CO2: 26 mmol/L (ref 22–32)
CREATININE: 1.93 mg/dL — AB (ref 0.44–1.00)
GFR calc Af Amer: 26 mL/min — ABNORMAL LOW (ref >60)
GFR, EST NON AFRICAN AMERICAN: 23 mL/min — AB (ref >60)
Glucose, Bld: 87 mg/dL (ref 70–99)
Potassium: 3.5 mmol/L (ref 3.5–5.1)
SODIUM: 138 mmol/L (ref 135–145)
Total Bilirubin: 1.3 mg/dL — ABNORMAL HIGH (ref 0.3–1.2)
Total Protein: 6.6 g/dL (ref 6.5–8.1)

## 2017-10-31 LAB — URINALYSIS, ROUTINE W REFLEX MICROSCOPIC
Bacteria, UA: NONE SEEN
Bilirubin Urine: NEGATIVE
GLUCOSE, UA: NEGATIVE mg/dL
Hgb urine dipstick: NEGATIVE
Ketones, ur: NEGATIVE mg/dL
Leukocytes, UA: NEGATIVE
Nitrite: NEGATIVE
SPECIFIC GRAVITY, URINE: 1.011 (ref 1.005–1.030)
pH: 8 (ref 5.0–8.0)

## 2017-10-31 LAB — ETHANOL

## 2017-10-31 LAB — CBC WITH DIFFERENTIAL/PLATELET
Abs Immature Granulocytes: 0 10*3/uL (ref 0.0–0.1)
BASOS PCT: 1 %
Basophils Absolute: 0.1 10*3/uL (ref 0.0–0.1)
EOS ABS: 0.2 10*3/uL (ref 0.0–0.7)
Eosinophils Relative: 2 %
HEMATOCRIT: 36.6 % (ref 36.0–46.0)
Hemoglobin: 11.9 g/dL — ABNORMAL LOW (ref 12.0–15.0)
IMMATURE GRANULOCYTES: 0 %
LYMPHS ABS: 2 10*3/uL (ref 0.7–4.0)
Lymphocytes Relative: 24 %
MCH: 29.8 pg (ref 26.0–34.0)
MCHC: 32.5 g/dL (ref 30.0–36.0)
MCV: 91.5 fL (ref 78.0–100.0)
MONO ABS: 0.7 10*3/uL (ref 0.1–1.0)
MONOS PCT: 8 %
NEUTROS PCT: 65 %
Neutro Abs: 5.4 10*3/uL (ref 1.7–7.7)
Platelets: 197 10*3/uL (ref 150–400)
RBC: 4 MIL/uL (ref 3.87–5.11)
RDW: 12.8 % (ref 11.5–15.5)
WBC: 8.3 10*3/uL (ref 4.0–10.5)

## 2017-10-31 LAB — RAPID URINE DRUG SCREEN, HOSP PERFORMED
AMPHETAMINES: NOT DETECTED
BENZODIAZEPINES: NOT DETECTED
Barbiturates: NOT DETECTED
Cocaine: NOT DETECTED
OPIATES: NOT DETECTED
TETRAHYDROCANNABINOL: NOT DETECTED

## 2017-10-31 LAB — PROTIME-INR
INR: 1.04
INR: 1.09
Prothrombin Time: 13.5 seconds (ref 11.4–15.2)
Prothrombin Time: 14 seconds (ref 11.4–15.2)

## 2017-10-31 LAB — I-STAT TROPONIN, ED: TROPONIN I, POC: 0.03 ng/mL (ref 0.00–0.08)

## 2017-10-31 LAB — GLUCOSE, CAPILLARY: Glucose-Capillary: 76 mg/dL (ref 70–99)

## 2017-10-31 LAB — I-STAT CG4 LACTIC ACID, ED: LACTIC ACID, VENOUS: 1.06 mmol/L (ref 0.5–1.9)

## 2017-10-31 LAB — TSH: TSH: 1.328 u[IU]/mL (ref 0.350–4.500)

## 2017-10-31 LAB — AMMONIA: Ammonia: 17 umol/L (ref 9–35)

## 2017-10-31 LAB — APTT: APTT: 30 s (ref 24–36)

## 2017-10-31 LAB — CBG MONITORING, ED: GLUCOSE-CAPILLARY: 82 mg/dL (ref 70–99)

## 2017-10-31 MED ORDER — SODIUM CHLORIDE 0.9 % IV SOLN: 250.0000 mL | INTRAVENOUS | Status: DC | PRN

## 2017-10-31 MED ORDER — ASPIRIN EC 81 MG PO TBEC
81.0000 mg | DELAYED_RELEASE_TABLET | Freq: Every day | ORAL | Status: DC
  Administered 2017-11-01 – 2017-11-05 (×5): 81 mg via ORAL
  Filled 2017-10-31 (×5): qty 1

## 2017-10-31 MED ORDER — ALBUTEROL SULFATE (2.5 MG/3ML) 0.083% IN NEBU: 2.5000 mg | INHALATION_SOLUTION | Freq: Four times a day (QID) | RESPIRATORY_TRACT | Status: DC | PRN

## 2017-10-31 MED ORDER — INSULIN GLARGINE 100 UNIT/ML ~~LOC~~ SOLN
15.0000 [IU] | Freq: Every day | SUBCUTANEOUS | Status: DC
  Administered 2017-11-01 – 2017-11-04 (×4): 15 [IU] via SUBCUTANEOUS
  Filled 2017-10-31 (×6): qty 0.15

## 2017-10-31 MED ORDER — ACETAMINOPHEN 325 MG PO TABS
650.0000 mg | ORAL_TABLET | Freq: Four times a day (QID) | ORAL | Status: DC | PRN
  Filled 2017-10-31: qty 2

## 2017-10-31 MED ORDER — SENNOSIDES-DOCUSATE SODIUM 8.6-50 MG PO TABS
1.0000 | ORAL_TABLET | Freq: Every evening | ORAL | Status: DC | PRN
Start: 2017-10-31 — End: 2017-11-05

## 2017-10-31 MED ORDER — HEPARIN SODIUM (PORCINE) 5000 UNIT/ML IJ SOLN
5000.0000 [IU] | Freq: Three times a day (TID) | INTRAMUSCULAR | Status: DC
  Administered 2017-10-31 – 2017-11-05 (×14): 5000 [IU] via SUBCUTANEOUS
  Filled 2017-10-31 (×14): qty 1

## 2017-10-31 MED ORDER — INSULIN ASPART 100 UNIT/ML ~~LOC~~ SOLN
0.0000 [IU] | Freq: Three times a day (TID) | SUBCUTANEOUS | Status: DC
  Administered 2017-11-01 – 2017-11-02 (×3): 1 [IU] via SUBCUTANEOUS
  Administered 2017-11-02: 3 [IU] via SUBCUTANEOUS
  Administered 2017-11-03 – 2017-11-04 (×2): 2 [IU] via SUBCUTANEOUS
  Administered 2017-11-04: 1 [IU] via SUBCUTANEOUS
  Administered 2017-11-05: 3 [IU] via SUBCUTANEOUS
  Administered 2017-11-05: 1 [IU] via SUBCUTANEOUS

## 2017-10-31 MED ORDER — ATORVASTATIN CALCIUM 20 MG PO TABS
20.0000 mg | ORAL_TABLET | Freq: Every day | ORAL | Status: DC
  Administered 2017-11-01 – 2017-11-04 (×4): 20 mg via ORAL
  Filled 2017-10-31 (×3): qty 2
  Filled 2017-10-31 (×3): qty 1
  Filled 2017-10-31: qty 2
  Filled 2017-10-31 (×2): qty 1

## 2017-10-31 MED ORDER — IPRATROPIUM BROMIDE 0.02 % IN SOLN: 0.5000 mg | Freq: Four times a day (QID) | RESPIRATORY_TRACT | Status: DC | PRN

## 2017-10-31 MED ORDER — SODIUM CHLORIDE 0.9% FLUSH
3.0000 mL | Freq: Two times a day (BID) | INTRAVENOUS | Status: DC
  Administered 2017-10-31 – 2017-11-04 (×4): 3 mL via INTRAVENOUS

## 2017-10-31 MED ORDER — VITAMIN D 1000 UNITS PO TABS
1000.0000 [IU] | ORAL_TABLET | Freq: Every day | ORAL | Status: DC
  Administered 2017-11-01 – 2017-11-05 (×5): 1000 [IU] via ORAL
  Filled 2017-10-31 (×5): qty 1

## 2017-10-31 MED ORDER — DEXTROSE-NACL 5-0.9 % IV SOLN
INTRAVENOUS | Status: DC
  Administered 2017-10-31: 23:00:00 via INTRAVENOUS

## 2017-10-31 MED ORDER — SODIUM CHLORIDE 0.9 % IV SOLN
INTRAVENOUS | Status: DC
  Administered 2017-11-01: via INTRAVENOUS

## 2017-10-31 MED ORDER — SODIUM CHLORIDE 0.9% FLUSH
3.0000 mL | Freq: Two times a day (BID) | INTRAVENOUS | Status: DC
  Administered 2017-10-31 – 2017-11-04 (×7): 3 mL via INTRAVENOUS

## 2017-10-31 MED ORDER — FAMOTIDINE 20 MG PO TABS
20.0000 mg | ORAL_TABLET | Freq: Every day | ORAL | Status: DC
  Administered 2017-10-31 – 2017-11-04 (×5): 20 mg via ORAL
  Filled 2017-10-31 (×5): qty 1

## 2017-10-31 MED ORDER — PANTOPRAZOLE SODIUM 40 MG PO TBEC
40.0000 mg | DELAYED_RELEASE_TABLET | Freq: Every day | ORAL | Status: DC
  Administered 2017-11-01 – 2017-11-05 (×5): 40 mg via ORAL
  Filled 2017-10-31 (×5): qty 1

## 2017-10-31 MED ORDER — AMLODIPINE BESYLATE 5 MG PO TABS
5.0000 mg | ORAL_TABLET | Freq: Every day | ORAL | Status: DC
  Administered 2017-11-01: 5 mg via ORAL
  Filled 2017-10-31: qty 1

## 2017-10-31 MED ORDER — INSULIN ASPART 100 UNIT/ML ~~LOC~~ SOLN: 0.0000 [IU] | Freq: Every day | SUBCUTANEOUS | Status: DC

## 2017-10-31 MED ORDER — ONDANSETRON HCL 4 MG PO TABS: 4.0000 mg | ORAL_TABLET | Freq: Four times a day (QID) | ORAL | Status: DC | PRN

## 2017-10-31 MED ORDER — IPRATROPIUM BROMIDE HFA 17 MCG/ACT IN AERS: 2.0000 | INHALATION_SPRAY | Freq: Four times a day (QID) | RESPIRATORY_TRACT | Status: DC | PRN

## 2017-10-31 MED ORDER — SODIUM CHLORIDE 0.9% FLUSH: 3.0000 mL | INTRAVENOUS | Status: DC | PRN

## 2017-10-31 MED ORDER — ACETAMINOPHEN 650 MG RE SUPP: 650.0000 mg | Freq: Four times a day (QID) | RECTAL | Status: DC | PRN

## 2017-10-31 MED ORDER — ONDANSETRON HCL 4 MG/2ML IJ SOLN: 4.0000 mg | Freq: Four times a day (QID) | INTRAMUSCULAR | Status: DC | PRN

## 2017-10-31 MED ORDER — ISOSORBIDE MONONITRATE ER 30 MG PO TB24
30.0000 mg | ORAL_TABLET | Freq: Every day | ORAL | Status: DC
  Administered 2017-11-01 – 2017-11-05 (×5): 30 mg via ORAL
  Filled 2017-10-31 (×5): qty 1

## 2017-10-31 MED ORDER — VITAMIN B-1 100 MG PO TABS
100.0000 mg | ORAL_TABLET | ORAL | Status: DC
  Administered 2017-11-01 – 2017-11-05 (×3): 100 mg via ORAL
  Filled 2017-10-31 (×3): qty 1

## 2017-10-31 MED ORDER — OMEGA-3-ACID ETHYL ESTERS 1 G PO CAPS
1.0000 g | ORAL_CAPSULE | Freq: Every day | ORAL | Status: DC
  Administered 2017-11-01 – 2017-11-05 (×5): 1 g via ORAL
  Filled 2017-10-31 (×5): qty 1

## 2017-10-31 MED ORDER — CITALOPRAM HYDROBROMIDE 10 MG PO TABS
20.0000 mg | ORAL_TABLET | Freq: Every day | ORAL | Status: DC
  Administered 2017-11-01 – 2017-11-05 (×5): 20 mg via ORAL
  Filled 2017-10-31 (×5): qty 2

## 2017-10-31 NOTE — ED Notes (Signed)
Dr Myna Hidalgo informed of pt choking on water during swallow screen, keep NPO for now

## 2017-10-31 NOTE — H&P (Signed)
History and Physical    Destiny [name] BJS:[id number] DOB: 03-Feb-1932 DOA: 10/31/2017  PCP: Mosie Lukes, MD   Patient coming from: Home   Chief Complaint: Transient headache, confusion, and slurred speech   HPI: Destiny [name] is a 84 y.o. female with medical history significant for CAD, type 2 diabetes mellitus, hypertension, history of CVA, paroxysmal atrial fibrillation no longer on anticoagulation due to recurrent falls, now presenting to the emergency department for evaluation of headache with garbled speech and increased confusion.  Patient was seen in her normal state at approximately 13:30 this afternoon, later noted by family to be holding her head in her hands, with increased confusion, and garbled speech.  She was complaining of a headache.  She was brought into the ED for further evaluation, headache and slurred speech had resolved, and family reports that her confusion is nearly back to baseline in the ED.  Patient acknowledges having a headache earlier, described as localized to the top of her head, but reports that that is resolved.  She denies change in vision or hearing, denies chest pain or palpitations, and denies abdominal pain.  ED Course: Upon arrival to the ED, patient is found to be afebrile, saturating well on room air, and hypertensive to 205/84.  CBC is unremarkable, urinalysis notable for proteinuria, lactic acid normal, INR 1.04, troponin normal, and UDS negative.  Ethanol is undetectable.  Noncontrast head CT is negative for acute intracranial abnormality.  Chest x-ray is negative for edema or consolidation, notable for mild enlargement of the heart.  Neurology was consulted and recommended a medical admission with ongoing cardiac monitoring, MRI brain, EEG, and further laboratory evaluation of encephalopathy.  Patient remains hemodynamically stable, in no apparent respiratory distress, and will be observed on the telemetry unit for ongoing evaluation and  management.  Review of Systems:  All other systems reviewed and apart from HPI, are negative.  Past Medical History:  Diagnosis Date  . Acute renal insufficiency 12/24/2013  . Amaurosis fugax   . Arthritis 12/06/2012  . Atherosclerosis   . CAD (coronary artery disease)    a. s/p CABG x 4 (LIMA->LAD, VG->Diag, VG->OM1->OM3);  b. 2010 Cath: 3/4 patent grafts (VG->diag occluded), 3vd, sev PVD ->Med Rx;  c. 12/2011 Lexi MV: sm area of mild mid and apical ant wall ischemia ->med rx.  . Carotid bruit   . Chronic kidney disease (CKD) stage G4/A1, severely decreased glomerular filtration rate (GFR) between 15-29 mL/min/1.73 square meter and albuminuria creatinine ratio less than 30 mg/g (HCC)    GFR 25 on 12/25/13  . CVA (cerebral infarction)   . Dementia    pt denies  . Depression 12/24/2013  . Diabetes mellitus type II    insulin dependent.   . Diabetic peripheral neuropathy ([place])   . Diabetic retinopathy   . Diverticulosis   . Duodenal ulcer 2009 and 11/2011   caused GI bleeding  . Dyspnea   . Gallstones 2009   seen on CT scan  . GI bleed 04/2011, 11/2011   found non-bleeding duodenal ulcers  . Hyperlipidemia   . Hypersomnolence 03/12/2013  . Hypertension   . Iron deficiency anemia secondary to blood loss (chronic)   . Melena 1015/2015   PEPTIC ULCER    REQUIRING TRANSFUSION  . Pedal edema 10/12/2016  . PVD (peripheral vascular disease) ([place])   . Rib fracture 04/21/2015  . Stroke ([place])   . Thiamine deficiency 08/13/2015  . UTI (urinary tract infection) 04/08/2014  . Weight loss,  non-intentional 01/29/2014    Past Surgical History:  Procedure Laterality Date  . ABDOMINAL HYSTERECTOMY    . COLONOSCOPY  05/20/2011   Procedure: COLONOSCOPY;  Surgeon: Scarlette Shorts, MD;  Location: [place];  Service: Endoscopy;  Laterality: N/A;  . COLONOSCOPY WITH PROPOFOL N/A 06/09/2017   Procedure: COLONOSCOPY WITH PROPOFOL;  Surgeon: Milus Banister, MD;  Location: [facility] ENDOSCOPY;  Service:  Endoscopy;  Laterality: N/A;  . CORONARY ARTERY BYPASS GRAFT  1999   x 4  . ENTEROSCOPY N/A 12/29/2013   Procedure: ENTEROSCOPY;  Surgeon: Inda Castle, MD;  Location: [place];  Service: Endoscopy;  Laterality: N/A;  . ESOPHAGOGASTRODUODENOSCOPY  05/16/2011   Procedure: ESOPHAGOGASTRODUODENOSCOPY (EGD);  Surgeon: Gatha Mayer, MD;  Location[facility] ENDOSCOPY;  Service: Endoscopy;  Laterality: N/A;  . ESOPHAGOGASTRODUODENOSCOPY  12/08/2011   Procedure: ESOPHAGOGASTRODUODENOSCOPY (EGD);  Surgeon: Ladene Artist, MD,FACG;  Location: [facility] ENDOSCOPY;  Service: Endoscopy;  Laterality: N/A;  . ESOPHAGOGASTRODUODENOSCOPY (EGD) WITH PROPOFOL N/A 06/08/2017   Procedure: ESOPHAGOGASTRODUODENOSCOPY (EGD) WITH PROPOFOL;  Surgeon: Milus Banister, MD;  Location: [facility] ENDOSCOPY;  Service: Endoscopy;  Laterality: N/A;  . UPPER GASTROINTESTINAL ENDOSCOPY  02/22/2008   w/biopsy, duedenal ulcer, antrum erosions     reports that she quit smoking about 16 years ago. Her smoking use included cigarettes. She has never used smokeless tobacco. She reports that she does not drink alcohol or use drugs.  No Known Allergies  Family History  Problem Relation Age of Onset  . Coronary artery disease Father   . Hypertension Father   . Stroke Father   . Cancer Mother        ?  . Diabetes Maternal Uncle   . Stroke Sister   . Colon cancer Neg Hx      Prior to Admission medications   Medication Sig Start Date End Date Taking? Authorizing Provider  albuterol (PROAIR HFA) 108 (90 Base) MCG/ACT inhaler Inhale 2 puffs into the lungs every 6 (six) hours as needed for wheezing or shortness of breath. 05/27/17   Saguier, Percell Miller, PA-C  amLODipine (NORVASC) 5 MG tablet Take 1 tablet (5 mg total) by mouth daily. 08/25/17   Mosie Lukes, MD  amLODipine (NORVASC) 5 MG tablet TAKE 1 TABLET (5 MG TOTAL) BY MOUTH DAILY. 10/29/17   Mosie Lukes, MD  aspirin EC 81 MG tablet Take 1 tablet (81 mg total) by mouth daily. 10/01/17    Mosie Lukes, MD  atorvastatin (LIPITOR) 20 MG tablet Take 1 tablet (20 mg total) by mouth daily. 04/27/17   Mosie Lukes, MD  blood glucose meter kit and supplies KIT Dispense based on patient and insurance preference. Use up to four times daily as directed. Dx: E11.9, Z79.4 09/14/17   Mosie Lukes, MD  cefdinir (OMNICEF) 300 MG capsule Take 1 capsule (300 mg total) by mouth 2 (two) times daily. 10/30/17   Saguier, Percell Miller, PA-C  Cholecalciferol (VITAMIN D) 1000 UNITS capsule Take 1,000 Units by mouth daily.     [provider]  citalopram (CELEXA) 20 MG tablet Take 1 tablet (20 mg total) by mouth daily. 04/27/17   Mosie Lukes, MD  ferrous sulfate 325 (65 FE) MG tablet Take 325 mg daily with breakfast by mouth.    [provider]  furosemide (LASIX) 20 MG tablet Take 1 tablet (20 mg total) by mouth daily as needed. Edema or weight >3#/24 hours 10/01/17   Mosie Lukes, MD  insulin glargine (LANTUS) 100 UNIT/ML injection Inject  81m [place] at bedtime Patient taking differently: Inject 25 Units into the skin daily.  09/03/17   BMosie Lukes MD  insulin lispro (HUMALOG KWIKPEN) 100 UNIT/ML KiwkPen Inject 0.05-0.1 mLs (5-10 Units total) into the skin 3 (three) times daily with meals. 03/02/16   BMosie Lukes MD  ipratropium (ATROVENT HFA) 17 MCG/ACT inhaler Inhale 2 puffs into the lungs every 6 (six) hours as needed for wheezing. 05/21/17   Saguier, EPercell Miller PA-C  isosorbide mononitrate (IMDUR) 30 MG 24 hr tablet Take 1 tablet (30 mg total) by mouth at bedtime. Patient taking differently: Take 30 mg by mouth daily.  08/25/17   BMosie Lukes MD  nitroGLYCERIN (NITROSTAT) 0.4 MG SL tablet Place 0.4 mg under the tongue every 5 (five) minutes as needed for chest pain.    [provider]  Omega-3 Fatty Acids (FISH OIL) 1200 MG CAPS Take 1,200 mg by mouth daily.     [provider]  pantoprazole (PROTONIX) 40 MG tablet Take 1 tablet (40 mg total) by mouth daily.  10/25/17   BMosie Lukes MD  ranitidine (ZANTAC) 150 MG tablet Take 150 mg by mouth at bedtime.    [provider]  thiamine (VITAMIN B-1) 100 MG tablet Take 1 tablet (100 mg total) by mouth daily. Patient taking differently: Take 100 mg See admin instructions by mouth. Monday, Wednesday and Friday 02/24/16   BMosie Lukes MD    Physical Exam: Vitals:   10/31/17 1745 10/31/17 1748 10/31/17 1750  BP:   (!) 205/84  Pulse:   69  Resp:   16  Temp:   98.3 F (36.8 C)  TempSrc:   Oral  SpO2: 96%  97%  Weight:  69.4 kg   Height:  _0  (1.702 m)       Constitutional: NAD, calm  Eyes: PERTLA, lids and conjunctivae normal ENMT: Mucous membranes are moist. Posterior pharynx clear of any exudate or lesions.   Neck: normal, supple, no masses, no thyromegaly Respiratory: clear to auscultation bilaterally, no wheezing, no crackles. Normal respiratory effort.   Cardiovascular: S1 & S2 heard, regular rate and rhythm. Trace LLE edema. No significant JVD. Abdomen: No distension, no tenderness, soft. Bowel sounds normal.  Musculoskeletal: no clubbing / cyanosis. No joint deformity upper and lower extremities.    Skin: no significant rashes, lesions, ulcers. Warm, dry, well-perfused. Neurologic: CN 2-12 grossly intact. Sensation intact, DTR normal. Strength 5/5 in all 4 limbs.  Psychiatric: Alert and oriented to person and place only. Pleasant and cooperative.     Labs on Admission: I have personally reviewed following labs and imaging studies  CBC: Recent Labs  Lab 10/28/17 1241 10/31/17 1940  WBC 8.4 8.3  NEUTROABS 5.3 5.4  HGB 12.7 11.9*  HCT 38.2 36.6  MCV 90.0 91.5  PLT 209.0 1093  Basic Metabolic Panel: Recent Labs  Lab 10/28/17 1241  NA 136  K 4.2  CL 100  CO2 28  GLUCOSE 239*  BUN 27*  CREATININE 1.84*  CALCIUM 9.7   GFR: Estimated Creatinine Clearance: 22.1 mL/min (A) (by C-G formula based on SCr of 1.84 mg/dL (H)). Liver Function Tests: Recent  Labs  Lab 10/28/17 1241  AST 15  ALT 13  ALKPHOS 72  BILITOT 0.9  PROT 7.4  ALBUMIN 3.8   No results for input(s): LIPASE, AMYLASE in the last 168 hours. No results for input(s): AMMONIA in the last 168 hours. Coagulation Profile: Recent Labs  Lab 10/31/17 1827 10/31/17 1940  INR 1.04 1.09   Cardiac Enzymes: No results for input(s): CKTOTAL, CKMB, CKMBINDEX, TROPONINI in the last 168 hours. BNP (last 3 results) Recent Labs    05/21/17 1430  PROBNP 210.0*   HbA1C: No results for input(s): HGBA1C in the last 72 hours. CBG: No results for input(s): GLUCAP in the last 168 hours. Lipid Profile: No results for input(s): CHOL, HDL, LDLCALC, TRIG, CHOLHDL, LDLDIRECT in the last 72 hours. Thyroid Function Tests: No results for input(s): TSH, T4TOTAL, FREET4, T3FREE, THYROIDAB in the last 72 hours. Anemia Panel: No results for input(s): VITAMINB12, FOLATE, FERRITIN, TIBC, IRON, RETICCTPCT in the last 72 hours. Urine analysis:    Component Value Date/Time   COLORURINE YELLOW 10/31/2017 1955   APPEARANCEUR CLEAR 10/31/2017 1955   LABSPEC 1.011 10/31/2017 1955   PHURINE 8.0 10/31/2017 1955   GLUCOSEU NEGATIVE 10/31/2017 1955   GLUCOSEU 100 (A) 01/26/2017 1114   HGBUR NEGATIVE 10/31/2017 1955   HGBUR large 04/26/2009 [place] 10/31/2017 1955   BILIRUBINUR neg 10/28/2017 1132   [place] 10/31/2017 1955   PROTEINUR >=300 (A) 10/31/2017 1955   UROBILINOGEN 0.2 10/28/2017 1132   UROBILINOGEN 0.2 01/26/2017 1114   NITRITE NEGATIVE 10/31/2017 1955   LEUKOCYTESUR NEGATIVE 10/31/2017 1955   Sepsis Labs: _0 (procalcitonin:4,lacticidven:4) ) Recent Results (from the past 240 hour(s))  Urine Culture     Status: Abnormal   Collection Time: 10/28/17 11:29 AM  Result Value Ref Range Status   MICRO NUMBER: [id number]  Final   SPECIMEN QUALITY: ADEQUATE  Final   Sample Source NOT GIVEN  Final   STATUS: FINAL  Final   ISOLATE 1: Coagulase  negative staphylococcus, not S. (A)  Final    Comment: Greater than 100,000 CFU/mL of Coagulase negative staphylococcus, not S. saprophyticus      Susceptibility   Coagulase negative staphylococcus, not s. - URINE CULTURE POSITIVE 1    VANCOMYCIN 1 Sensitive     CIPROFLOXACIN <=0.5 Sensitive     LEVOFLOXACIN <=0.12 Sensitive     GENTAMICIN <=0.5 Sensitive     NITROFURANTOIN <=16 Sensitive     OXACILLIN* NR Resistant      * Oxacillin-resistant staphylococci are resistant toall currently available beta-lactam antimicrobialagents including penicillins, beta lactam/beta-lactamase inhibitor combinations, and cephems withstaphylococcal indications, including Cefazolin.    TETRACYCLINE 2 Sensitive     TRIMETH/SULFA* 160 Resistant      * Oxacillin-resistant staphylococci are resistant toall currently available beta-lactam antimicrobialagents including penicillins, beta lactam/beta-lactamase inhibitor combinations, and cephems withstaphylococcal indications, including Cefazolin.Legend:S = Susceptible  I = IntermediateR = Resistant  NS = Not susceptible* = Not tested  NR = Not reported**NN = See antimicrobic comments     Radiological Exams on Admission: Ct Head Code Stroke Wo Contrast  Result Date: 10/31/2017 CLINICAL DATA:  Code stroke. Last seen normal at 13 30 hours. Follow up stroke. History of stroke, dementia, hypertension, hyperlipidemia, diabetes. EXAM: CT HEAD WITHOUT CONTRAST TECHNIQUE: Contiguous axial images were obtained from the base of the skull through the vertex without intravenous contrast. COMPARISON:  CT HEAD October 28, 2017 and MRI head May 22, 2017. FINDINGS: BRAIN: No intraparenchymal hemorrhage, mass effect nor midline shift. Moderate parenchymal brain volume loss, cavum septum pellucidum. Advanced mid brain volume loss. RIGHT mesial temporal occipital lobe encephalomalacia. Adjacent RIGHT parietal lobe encephalomalacia. Old LEFT greater than RIGHT basal ganglia cystic infarct with  ex vacuo dilatation subjacent ventricle. Patchy supratentorial white matter hypodensities within normal range for patient's age, though non-specific are most compatible with  chronic small vessel ischemic disease. No acute large vascular territory infarcts. No abnormal extra-axial fluid collections. Basal cisterns are patent. VASCULAR: Severe calcific atherosclerosis of the carotid siphons. SKULL: No skull fracture. No significant scalp soft tissue swelling. SINUSES/ORBITS: Trace paranasal sinus mucosal thickening. Mastoid air cells are well aerated.The included ocular globes and orbital contents are non-suspicious. Status post bilateral ocular lens implants. OTHER: Patient is edentulous. ASPECTS [facility] Stroke Program Early CT Score) - Ganglionic level infarction (caudate, lentiform nuclei, internal capsule, insula, M1-M3 cortex): 7 - Supraganglionic infarction (M4-M6 cortex): 3 Total score (0-10 with 10 being normal): 10 IMPRESSION: 1. No acute intracranial process. 2. ASPECTS is 10. 3. Chronic changes including old RIGHT PCA territory, RIGHT posterior border zone posterior and small basal ganglia infarcts. 4. Severe atherosclerosis. 5. Critical Value/emergent results text paged to [place] via AMION secure system on 10/31/2017 at 7:17 pm, including interpreting physician's phone number. Electronically Signed   By: Elon Alas M.D.   On: 10/31/2017 19:19    EKG: Independently reviewed. Sinus rhythm, incomplete RBBB.   Assessment/Plan  1. Acute encephalopathy  - Presents following an episode of transient confusion and slurred speech, complaining of headache at the time and holding her head in her hands  - Head CT is negative for acute findings, notable for chronic ischemic changes  - Headache and slurred speech resolved, mental status close to baseline per son at bedside  - Neurology is consulting and much appreciated  - Continue cardiac monitoring, frequent neuro checks, check MRI brain and  EEG, check ammonia, TSH, RPR, and b12 levels   2. History of CVA  - Presents after transient neurologic changes that were non-focal - MRI pending  - Continue statin, ASA   3. Paroxysmal atrial fibrillation  - In a sinus rhythm on admission  - CHADS-VASc 94 (age x2, CVA x2, gender, CHF, DM, CAD, HTN) - Per chart notes from 09/28/17, she was taken off of anticoagulation due to balance issues, refusal to use walker, and recurrent falls    4. Hypertension  - BP elevated in ED, possibly d/t pain, less likely from acute CVA - Treat pain, follow-up MRI, continue Norvasc   5. Insulin-dependent DM  - A1c was 7.5% last month  - Managed at home with Lantus 25 units daily and Humalog 5 units TID  - Check CBG's, continue Lantus, use Novolog while in hospital    6. CKD stage IV  - Chem panel still pending, will follow-up    7. CAD  - No anginal complaints  - Continue Lipitor, ASA 81, and Imdur    8. Chronic diastolic CHF  - Preserved EF on echo on last month  - Appears compensated, uses Lasix only prn at home  - Hold Lasix, follow daily wt     DVT prophylaxis: sq heparin  Code Status: DNR  Family Communication: Son updated at bedside Consults called: Neurology Admission status: Observation     Vianne Bulls, MD [facility] Pager [phone]  If 7PM-7AM, please contact night-coverage [email or web address] Password [facility]  10/31/2017, 8:34 PM

## 2017-10-31 NOTE — ED Provider Notes (Signed)
[facility] EMERGENCY DEPARTMENT Provider Note   CSN: [id number] Arrival date & time: 10/31/17  1743     History   Chief Complaint Chief Complaint  Patient presents with  . Altered Mental Status    HPI           level 5 caveat history of dementia Destiny [name] is a 84 y.o. female presenting via EMS from home for altered mental status. Per family at bedside patient appeared slightly more altered than normal around lunchtime today with left-sided weakness which resolved spontaneously after a few minutes and she returned to baseline.  Family member states that he then left around 1:30 PM to go to work. EMS was called to the house when patient pressed her alert button, she was found with her head in her hands sitting in a chair.  Upon arrival in emergency department patient's family members state that she appears slightly more altered than normal, slurring speech.  They deny facial asymmetry at this time.  Patient answers some questions appropriately, she is aware of her son's name and room and her name in room however she is unable to tell day, month or year.  HPI  Past Medical History:  Diagnosis Date  . Acute renal insufficiency 12/24/2013  . Amaurosis fugax   . Arthritis 12/06/2012  . Atherosclerosis   . CAD (coronary artery disease)    a. s/p CABG x 4 (LIMA->LAD, VG->Diag, VG->OM1->OM3);  b. 2010 Cath: 3/4 patent grafts (VG->diag occluded), 3vd, sev PVD ->Med Rx;  c. 12/2011 Lexi MV: sm area of mild mid and apical ant wall ischemia ->med rx.  . Carotid bruit   . Chronic kidney disease (CKD) stage G4/A1, severely decreased glomerular filtration rate (GFR) between 15-29 mL/min/1.73 square meter and albuminuria creatinine ratio less than 30 mg/g (HCC)    GFR 25 on 12/25/13  . CVA (cerebral infarction)   . Dementia    pt denies  . Depression 12/24/2013  . Diabetes mellitus type II    insulin dependent.   . Diabetic peripheral neuropathy ([place])   . Diabetic  retinopathy   . Diverticulosis   . Duodenal ulcer 2009 and 11/2011   caused GI bleeding  . Dyspnea   . Gallstones 2009   seen on CT scan  . GI bleed 04/2011, 11/2011   found non-bleeding duodenal ulcers  . Hyperlipidemia   . Hypersomnolence 03/12/2013  . Hypertension   . Iron deficiency anemia secondary to blood loss (chronic)   . Melena 1015/2015   PEPTIC ULCER    REQUIRING TRANSFUSION  . Pedal edema 10/12/2016  . PVD (peripheral vascular disease) ([place])   . Rib fracture 04/21/2015  . Stroke ([place])   . Thiamine deficiency 08/13/2015  . UTI (urinary tract infection) 04/08/2014  . Weight loss, non-intentional 01/29/2014    Patient Active Problem List   Diagnosis Date Noted  . Acute encephalopathy 10/31/2017  . AF (paroxysmal atrial fibrillation) ([place]) 10/31/2017  . SOB (shortness of breath) 10/13/2017  . Diverticulosis of colon without hemorrhage   . Anticoagulated on Coumadin 06/07/2017  . Lower GI bleed 06/07/2017  . Encounter for therapeutic drug monitoring 05/31/2017  . DVT (deep venous thrombosis) ([place]) 05/22/2017  . Atrial fibrillation with RVR ([place]) 05/22/2017  . Ataxia 05/21/2017  . Shortness of breath 04/28/2017  . Stroke (cerebrum) ([place]) 02/01/2017  . Sinusitis 01/31/2017  . Pedal edema 10/12/2016  . Thiamine deficiency 08/13/2015  . Rib fracture 04/21/2015  . UTI (urinary tract infection) 04/08/2014  .  Duodenal ulcer with hemorrhage 12/29/2013  . CKD (chronic kidney disease) stage 4, GFR 15-29 ml/min (HCC) 12/28/2013  . GIB (gastrointestinal bleeding) 12/28/2013  . History of CVA (cerebrovascular accident) 12/28/2013  . Chronic diastolic heart failure ([place]) 12/28/2013  . Overweight (BMI 25.0-29.9) 12/28/2013  . Depression 12/24/2013  . Hypersomnolence 03/12/2013  . Arthritis 12/06/2012  . Unstable angina ([place]) 06/07/2012  . CAD (coronary artery disease) of artery bypass graft   . Hx of CABG 06/05/2012  . Debility 06/05/2012  . Anemia 05/27/2011  . Diabetes  mellitus type 2, insulin dependent ([place]) 12/22/2010  . Dementia 12/22/2010  . B12 deficiency 12/22/2010  . Type 2 diabetes mellitus with diabetic neuropathy, unspecified ([place]) 08/28/2008  . MURMUR 07/04/2008  . AMAUROSIS FUGAX 08/12/2007  . Hyperlipidemia 06/22/2007  . Essential hypertension 06/22/2007  . Peripheral vascular disease ([place]) 06/21/2007  . CAROTID BRUIT 06/21/2007    Past Surgical History:  Procedure Laterality Date  . ABDOMINAL HYSTERECTOMY    . COLONOSCOPY  05/20/2011   Procedure: COLONOSCOPY;  Surgeon: Scarlette Shorts, MD;  Location: [facility];  Service: Endoscopy;  Laterality: N/A;  . COLONOSCOPY WITH PROPOFOL N/A 06/09/2017   Procedure: COLONOSCOPY WITH PROPOFOL;  Surgeon: Milus Banister, MD;  Location: [facility] ENDOSCOPY;  Service: Endoscopy;  Laterality: N/A;  . CORONARY ARTERY BYPASS GRAFT  1999   x 4  . ENTEROSCOPY N/A 12/29/2013   Procedure: ENTEROSCOPY;  Surgeon: Inda Castle, MD;  Location: [facility];  Service: Endoscopy;  Laterality: N/A;  . ESOPHAGOGASTRODUODENOSCOPY  05/16/2011   Procedure: ESOPHAGOGASTRODUODENOSCOPY (EGD);  Surgeon: Gatha Mayer, MD;  Location: [facility] ENDOSCOPY;  Service: Endoscopy;  Laterality: N/A;  . ESOPHAGOGASTRODUODENOSCOPY  12/08/2011   Procedure: ESOPHAGOGASTRODUODENOSCOPY (EGD);  Surgeon: Ladene Artist, MD,FACG;  Location: [facility] ENDOSCOPY;  Service: Endoscopy;  Laterality: N/A;  . ESOPHAGOGASTRODUODENOSCOPY (EGD) WITH PROPOFOL N/A 06/08/2017   Procedure: ESOPHAGOGASTRODUODENOSCOPY (EGD) WITH PROPOFOL;  Surgeon: Milus Banister, MD;  Location: [facility] ENDOSCOPY;  Service: Endoscopy;  Laterality: N/A;  . UPPER GASTROINTESTINAL ENDOSCOPY  02/22/2008   w/biopsy, duedenal ulcer, antrum erosions     OB History   None      Home Medications    Prior to Admission medications   Medication Sig Start Date End Date Taking? Authorizing Provider  albuterol (PROAIR HFA) 108 (90 Base) MCG/ACT inhaler Inhale 2 puffs into the lungs every 6 (six) hours  as needed for wheezing or shortness of breath. 05/27/17   Saguier, Percell Miller, PA-C  amLODipine (NORVASC) 5 MG tablet Take 1 tablet (5 mg total) by mouth daily. 08/25/17   Mosie Lukes, MD  amLODipine (NORVASC) 5 MG tablet TAKE 1 TABLET (5 MG TOTAL) BY MOUTH DAILY. 10/29/17   Mosie Lukes, MD  aspirin EC 81 MG tablet Take 1 tablet (81 mg total) by mouth daily. 10/01/17   Mosie Lukes, MD  atorvastatin (LIPITOR) 20 MG tablet Take 1 tablet (20 mg total) by mouth daily. 04/27/17   Mosie Lukes, MD  blood glucose meter kit and supplies KIT Dispense based on patient and insurance preference. Use up to four times daily as directed. Dx: E11.9, Z79.4 09/14/17   Mosie Lukes, MD  cefdinir (OMNICEF) 300 MG capsule Take 1 capsule (300 mg total) by mouth 2 (two) times daily. 10/30/17   Saguier, Percell Miller, PA-C  Cholecalciferol (VITAMIN D) 1000 UNITS capsule Take 1,000 Units by mouth daily.     [provider]  citalopram (CELEXA) 20 MG tablet Take 1 tablet (20 mg total) by  mouth daily. 04/27/17   Mosie Lukes, MD  ferrous sulfate 325 (65 FE) MG tablet Take 325 mg daily with breakfast by mouth.    [provider]  furosemide (LASIX) 20 MG tablet Take 1 tablet (20 mg total) by mouth daily as needed. Edema or weight >3#/24 hours 10/01/17   Mosie Lukes, MD  insulin glargine (LANTUS) 100 UNIT/ML injection Inject 90m [place] at bedtime Patient taking differently: Inject 25 Units into the skin daily.  09/03/17   BMosie Lukes MD  insulin lispro (HUMALOG KWIKPEN) 100 UNIT/ML KiwkPen Inject 0.05-0.1 mLs (5-10 Units total) into the skin 3 (three) times daily with meals. 03/02/16   BMosie Lukes MD  ipratropium (ATROVENT HFA) 17 MCG/ACT inhaler Inhale 2 puffs into the lungs every 6 (six) hours as needed for wheezing. 05/21/17   Saguier, EPercell Miller PA-C  isosorbide mononitrate (IMDUR) 30 MG 24 hr tablet Take 1 tablet (30 mg total) by mouth at bedtime. Patient taking differently: Take 30 mg by mouth daily.   08/25/17   BMosie Lukes MD  nitroGLYCERIN (NITROSTAT) 0.4 MG SL tablet Place 0.4 mg under the tongue every 5 (five) minutes as needed for chest pain.    [provider]  Omega-3 Fatty Acids (FISH OIL) 1200 MG CAPS Take 1,200 mg by mouth daily.     [provider]  pantoprazole (PROTONIX) 40 MG tablet Take 1 tablet (40 mg total) by mouth daily. 10/25/17   BMosie Lukes MD  ranitidine (ZANTAC) 150 MG tablet Take 150 mg by mouth at bedtime.    [provider]  thiamine (VITAMIN B-1) 100 MG tablet Take 1 tablet (100 mg total) by mouth daily. Patient taking differently: Take 100 mg See admin instructions by mouth. Monday, Wednesday and Friday 02/24/16   BMosie Lukes MD    Family History Family History  Problem Relation Age of Onset  . Coronary artery disease Father   . Hypertension Father   . Stroke Father   . Cancer Mother        ?  . Diabetes Maternal Uncle   . Stroke Sister   . Colon cancer Neg Hx     Social History Social History   Tobacco Use  . Smoking status: Former Smoker    Types: Cigarettes    Last attempt to quit: 12/31/2000    Years since quitting: 16.8  . Smokeless tobacco: Never Used  Substance Use Topics  . Alcohol use: No  . Drug use: No     Allergies   Patient has no known allergies.   Review of Systems Review of Systems  Unable to perform ROS: Dementia     Physical Exam Updated Vital Signs BP (!) 170/66 (BP Location: Right Arm)   Pulse 62   Temp 98.4 F (36.9 C) (Oral)   Resp 18   Ht '5\' 7"'$  (1.702 m)   Wt 69.4 kg   SpO2 96%   BMI 23.96 kg/m   Physical Exam  Constitutional: She is oriented to person, place, and time. She appears well-developed and well-nourished. No distress.  HENT:  Head: Normocephalic and atraumatic.  Right Ear: External ear normal.  Left Ear: External ear normal.  Nose: Nose normal.  Mouth/Throat: Oropharynx is clear and moist.  Eyes: Pupils are equal, round, and reactive to light.  Conjunctivae and EOM are normal.  Neck: Trachea normal and normal range of motion. Neck supple. No tracheal deviation present.  Cardiovascular: Normal rate, normal heart sounds and intact  distal pulses.  Pulmonary/Chest: Effort normal and breath sounds normal. No respiratory distress. She has no wheezes.  Abdominal: Soft. Bowel sounds are normal. There is no tenderness. There is no rigidity, no rebound and no guarding.  Musculoskeletal: Normal range of motion. She exhibits no deformity.  Neurological: She is alert and oriented to person, place, and time. No cranial nerve deficit or sensory deficit.  Mental Status: Alert Cranial Nerves: II: Pupils equal, round, reactive to light III,IV, VI: ptosis not present, extra-ocular motions intact bilaterally V,VII: smile symmetric, eyebrows raise symmetric, facial light touch sensation equal VIII: hearing grossly normal to voice X: uvula elevates symmetrically XI: bilateral shoulder shrug symmetric and strong XII: midline tongue extension without fassiculations Motor: Normal tone. 5/5 strength in upper and lower extremities bilaterally including strong and equal grip strength and dorsiflexion/plantar flexion Sensory: Sensation intact to light touch in all extremities. Cerebellar: normal finger-to-nose with bilateral upper extremities CV: distal pulses palpable throughout   Skin: Skin is warm and dry. Capillary refill takes less than 2 seconds.  Psychiatric: She has a normal mood and affect. Her behavior is normal.     ED Treatments / Results  Labs (all labs ordered are listed, but only abnormal results are displayed) Labs Reviewed  URINALYSIS, ROUTINE W REFLEX MICROSCOPIC - Abnormal; Notable for the following components:      Result Value   Protein, ur >=300 (*)    All other components within normal limits  CBC WITH DIFFERENTIAL/PLATELET - Abnormal; Notable for the following components:   Hemoglobin 11.9 (*)    All other components  within normal limits  COMPREHENSIVE METABOLIC PANEL - Abnormal; Notable for the following components:   BUN 24 (*)    Creatinine, Ser 1.93 (*)    Albumin 3.3 (*)    Total Bilirubin 1.3 (*)    GFR calc non Af Amer 23 (*)    GFR calc Af Amer 26 (*)    All other components within normal limits  PROTIME-INR  ETHANOL  RAPID URINE DRUG SCREEN, HOSP PERFORMED  PROTIME-INR  APTT  AMMONIA  TSH  GLUCOSE, CAPILLARY  CBC WITH DIFFERENTIAL/PLATELET  VITAMIN B12  RPR  HIV ANTIBODY (ROUTINE TESTING)  BASIC METABOLIC PANEL  CBG MONITORING, ED  I-STAT CG4 LACTIC ACID, ED  I-STAT TROPONIN, ED    EKG None  Radiology Dg Chest 2 View  Result Date: 10/31/2017 CLINICAL DATA:  Altered mental status EXAM: CHEST - 2 VIEW COMPARISON:  October 12, 2017 FINDINGS: No evident edema or consolidation. Heart is mildly enlarged with pulmonary vascularity normal. Patient is status post coronary artery bypass grafting. There is aortic atherosclerosis. No pneumothorax. No evident bone lesions. IMPRESSION: No edema or consolidation. Heart mildly enlarged. There is aortic atherosclerosis. Aortic Atherosclerosis (ICD10-I70.0). Electronically Signed   By: Lowella Grip III M.D.   On: 10/31/2017 20:45   Ct Head Code Stroke Wo Contrast  Result Date: 10/31/2017 CLINICAL DATA:  Code stroke. Last seen normal at 13 30 hours. Follow up stroke. History of stroke, dementia, hypertension, hyperlipidemia, diabetes. EXAM: CT HEAD WITHOUT CONTRAST TECHNIQUE: Contiguous axial images were obtained from the base of the skull through the vertex without intravenous contrast. COMPARISON:  CT HEAD October 28, 2017 and MRI head May 22, 2017. FINDINGS: BRAIN: No intraparenchymal hemorrhage, mass effect nor midline shift. Moderate parenchymal brain volume loss, cavum septum pellucidum. Advanced mid brain volume loss. RIGHT mesial temporal occipital lobe encephalomalacia. Adjacent RIGHT parietal lobe encephalomalacia. Old LEFT greater than  RIGHT basal ganglia cystic infarct  with ex vacuo dilatation subjacent ventricle. Patchy supratentorial white matter hypodensities within normal range for patient's age, though non-specific are most compatible with chronic small vessel ischemic disease. No acute large vascular territory infarcts. No abnormal extra-axial fluid collections. Basal cisterns are patent. VASCULAR: Severe calcific atherosclerosis of the carotid siphons. SKULL: No skull fracture. No significant scalp soft tissue swelling. SINUSES/ORBITS: Trace paranasal sinus mucosal thickening. Mastoid air cells are well aerated.The included ocular globes and orbital contents are non-suspicious. Status post bilateral ocular lens implants. OTHER: Patient is edentulous. ASPECTS [facility] Stroke Program Early CT Score) - Ganglionic level infarction (caudate, lentiform nuclei, internal capsule, insula, M1-M3 cortex): 7 - Supraganglionic infarction (M4-M6 cortex): 3 Total score (0-10 with 10 being normal): 10 IMPRESSION: 1. No acute intracranial process. 2. ASPECTS is 10. 3. Chronic changes including old RIGHT PCA territory, RIGHT posterior border zone posterior and small basal ganglia infarcts. 4. Severe atherosclerosis. 5. Critical Value/emergent results text paged to [place] via AMION secure system on 10/31/2017 at 7:17 pm, including interpreting physician's phone number. Electronically Signed   By: Elon Alas M.D.   On: 10/31/2017 19:19    Procedures Procedures (including critical care time)  Medications Ordered in ED Medications  aspirin EC tablet 81 mg (has no administration in time range)  amLODipine (NORVASC) tablet 5 mg (has no administration in time range)  atorvastatin (LIPITOR) tablet 20 mg (has no administration in time range)  isosorbide mononitrate (IMDUR) 24 hr tablet 30 mg (has no administration in time range)  citalopram (CELEXA) tablet 20 mg (has no administration in time range)  pantoprazole (PROTONIX) EC tablet 40  mg (has no administration in time range)  famotidine (PEPCID) tablet 20 mg (20 mg Oral Given 10/31/17 2246)  cholecalciferol (VITAMIN D) tablet 1,000 Units (has no administration in time range)  omega-3 acid ethyl esters (LOVAZA) capsule 1 g (has no administration in time range)  thiamine (VITAMIN B-1) tablet 100 mg (has no administration in time range)  albuterol (PROVENTIL) (2.5 MG/3ML) 0.083% nebulizer solution 2.5 mg (has no administration in time range)  heparin injection 5,000 Units (5,000 Units Subcutaneous Given 10/31/17 2246)  sodium chloride flush (NS) 0.9 % injection 3 mL (3 mLs Intravenous Given 10/31/17 2240)  sodium chloride flush (NS) 0.9 % injection 3 mL (3 mLs Intravenous Given 10/31/17 2240)  sodium chloride flush (NS) 0.9 % injection 3 mL (has no administration in time range)  0.9 %  sodium chloride infusion (has no administration in time range)  acetaminophen (TYLENOL) tablet 650 mg (has no administration in time range)    Or  acetaminophen (TYLENOL) suppository 650 mg (has no administration in time range)  senna-docusate (Senokot-S) tablet 1 tablet (has no administration in time range)  ondansetron (ZOFRAN) tablet 4 mg (has no administration in time range)    Or  ondansetron (ZOFRAN) injection 4 mg (has no administration in time range)  insulin glargine (LANTUS) injection 15 Units (15 Units Subcutaneous Not Given 10/31/17 2239)  insulin aspart (novoLOG) injection 0-9 Units (has no administration in time range)  insulin aspart (novoLOG) injection 0-5 Units (0 Units Subcutaneous Not Given 10/31/17 2238)  ipratropium (ATROVENT) nebulizer solution 0.5 mg (has no administration in time range)  dextrose 5 %-0.9 % sodium chloride infusion ( Intravenous New Bag/Given 10/31/17 2259)  0.9 %  sodium chloride infusion (has no administration in time range)     Initial Impression / Assessment and Plan / ED Course  I have reviewed the triage vital signs and the nursing notes.  Pertinent  labs & imaging results that were available during my care of the patient were reviewed by me and considered in my medical decision making (see chart for details).  Clinical Course as of Nov 01 2334  Nancy Fetter Oct 31, 2017  [id number] Consult called to Dr. Malen Gauze who recommends admission.   [BM]  2013 Consult called to Dr. Myna Hidalgo who agrees to admit patient to his service.   [BM]    Clinical Course User Index [BM] Deliah Boston, PA-C   Patient presenting with altered mental status and transient left-sided weakness that occurred at lunchtime that initially passed.  Patient was last seen normal at 130, upon EMS arrival patient altered, no focal neuro deficits.  Code stroke called however patient was outside of the 4.5-hour window at time of evaluation.  CT without acute changes, consult with Dr. Malen Gauze who advises admission for the patient for further evaluation and work-up.  Patient on Coumadin, PT/INR 13.5/1.04.  I-STAT lactic acid 1.06, ethanol negative.  Consult placed to medicine for admission.  At time of admission patient is alert and pleasant in room.  No acute distress.  No focal neurologic deficits.  Patient has been admitted by Dr. Myna Hidalgo for further evaluation.  Final Clinical Impressions(s) / ED Diagnoses   Final diagnoses:  Altered mental status, unspecified altered mental status type    ED Discharge Orders    None       Gari Crown 10/31/17 2339    Milton Ferguson, MD 11/02/17 1139

## 2017-10-31 NOTE — ED Notes (Signed)
Patient transported to CT 

## 2017-10-31 NOTE — ED Notes (Signed)
Two RNs attempted multiple IV sticks, Iv team consult in

## 2017-10-31 NOTE — Consult Note (Signed)
Neurology Consultation  Reason for Consult: *Acute code stroke Referring Physician: Dr. Roderic Palau  CC: Altered mental status, slurred speech, garbled speech  History is obtained from: Son, chart  HPI: Destiny [name] is a 84 y.o. female past medical history of coronary artery disease, chronic kidney disease 4, diabetes, dementia, depression, hyperlipidemia, hypertension, recent admission in November for similar presentation where she got TPA, MRI was negative for stroke at the time, brought in for evaluation of altered mental status.  Of note, she also has a history of seizures in 2014-which was deemed to be secondary to hypoperfusion and not true epileptic event and no anticonvulsant was started. According to son, last known normal at 1:30 PM.  Then noted by family to be acting confused.  Speech appeared to be garbled as well as slurred. Similar presentation to what had brought her in the hospital in November.  The family says she has had a bunch of strokes and mini strokes in the past. Her CAT scan from last admission reviewed and reveals multiple territories chronic infarcts. Patient currently on aspirin. No preceding illnesses.  No chest pain shortness of breath nausea vomiting fevers chills abdominal pain nausea vomiting.   LKW: 1:30 PM tpa given?: no, outside the window Premorbid modified Rankin scale (mRS): At least a 3   ROS: Obtained from the family-unable to obtain from the patient due to altered mental status.  Negative except as in the HPI.  Past Medical History:  Diagnosis Date  . Acute renal insufficiency 12/24/2013  . Amaurosis fugax   . Arthritis 12/06/2012  . Atherosclerosis   . CAD (coronary artery disease)    a. s/p CABG x 4 (LIMA->LAD, VG->Diag, VG->OM1->OM3);  b. 2010 Cath: 3/4 patent grafts (VG->diag occluded), 3vd, sev PVD ->Med Rx;  c. 12/2011 Lexi MV: sm area of mild mid and apical ant wall ischemia ->med rx.  . Carotid bruit   . Chronic kidney disease (CKD)  stage G4/A1, severely decreased glomerular filtration rate (GFR) between 15-29 mL/min/1.73 square meter and albuminuria creatinine ratio less than 30 mg/g (HCC)    GFR 25 on 12/25/13  . CVA (cerebral infarction)   . Dementia    pt denies  . Depression 12/24/2013  . Diabetes mellitus type II    insulin dependent.   . Diabetic peripheral neuropathy ([place])   . Diabetic retinopathy   . Diverticulosis   . Duodenal ulcer 2009 and 11/2011   caused GI bleeding  . Dyspnea   . Gallstones 2009   seen on CT scan  . GI bleed 04/2011, 11/2011   found non-bleeding duodenal ulcers  . Hyperlipidemia   . Hypersomnolence 03/12/2013  . Hypertension   . Iron deficiency anemia secondary to blood loss (chronic)   . Melena 1015/2015   PEPTIC ULCER    REQUIRING TRANSFUSION  . Pedal edema 10/12/2016  . PVD (peripheral vascular disease) ([place])   . Rib fracture 04/21/2015  . Stroke ([place])   . Thiamine deficiency 08/13/2015  . UTI (urinary tract infection) 04/08/2014  . Weight loss, non-intentional 01/29/2014    Family History  Problem Relation Age of Onset  . Coronary artery disease Father   . Hypertension Father   . Stroke Father   . Cancer Mother        ?  . Diabetes Maternal Uncle   . Stroke Sister   . Colon cancer Neg Hx    Social History:   reports that she quit smoking about 16 years ago. Her smoking use included  cigarettes. She has never used smokeless tobacco. She reports that she does not drink alcohol or use drugs.  Medications No current facility-administered medications for this encounter.   Current Outpatient Medications:  .  albuterol (PROAIR HFA) 108 (90 Base) MCG/ACT inhaler, Inhale 2 puffs into the lungs every 6 (six) hours as needed for wheezing or shortness of breath., Disp: 18 g, Rfl: 2 .  amLODipine (NORVASC) 5 MG tablet, Take 1 tablet (5 mg total) by mouth daily., Disp: 90 tablet, Rfl: 1 .  amLODipine (NORVASC) 5 MG tablet, TAKE 1 TABLET (5 MG TOTAL) BY MOUTH DAILY., Disp: 90  tablet, Rfl: 1 .  aspirin EC 81 MG tablet, Take 1 tablet (81 mg total) by mouth daily., Disp: , Rfl:  .  atorvastatin (LIPITOR) 20 MG tablet, Take 1 tablet (20 mg total) by mouth daily., Disp: 90 tablet, Rfl: 1 .  blood glucose meter kit and supplies KIT, Dispense based on patient and insurance preference. Use up to four times daily as directed. Dx: E11.9, Z79.4, Disp: 1 each, Rfl: 0 .  cefdinir (OMNICEF) 300 MG capsule, Take 1 capsule (300 mg total) by mouth 2 (two) times daily., Disp: 14 capsule, Rfl: 0 .  Cholecalciferol (VITAMIN D) 1000 UNITS capsule, Take 1,000 Units by mouth daily. , Disp: , Rfl:  .  citalopram (CELEXA) 20 MG tablet, Take 1 tablet (20 mg total) by mouth daily., Disp: 90 tablet, Rfl: 1 .  ferrous sulfate 325 (65 FE) MG tablet, Take 325 mg daily with breakfast by mouth., Disp: , Rfl:  .  furosemide (LASIX) 20 MG tablet, Take 1 tablet (20 mg total) by mouth daily as needed. Edema or weight >3#/24 hours, Disp: 30 tablet, Rfl: 3 .  insulin glargine (LANTUS) 100 UNIT/ML injection, Inject 87m [place] at bedtime (Patient taking differently: Inject 25 Units into the skin daily. ), Disp: 1500 mL, Rfl: 1 .  insulin lispro (HUMALOG KWIKPEN) 100 UNIT/ML KiwkPen, Inject 0.05-0.1 mLs (5-10 Units total) into the skin 3 (three) times daily with meals., Disp: 15 mL, Rfl: 4 .  ipratropium (ATROVENT HFA) 17 MCG/ACT inhaler, Inhale 2 puffs into the lungs every 6 (six) hours as needed for wheezing., Disp: 1 Inhaler, Rfl: 12 .  isosorbide mononitrate (IMDUR) 30 MG 24 hr tablet, Take 1 tablet (30 mg total) by mouth at bedtime. (Patient taking differently: Take 30 mg by mouth daily. ), Disp: 90 tablet, Rfl: 1 .  nitroGLYCERIN (NITROSTAT) 0.4 MG SL tablet, Place 0.4 mg under the tongue every 5 (five) minutes as needed for chest pain., Disp: , Rfl:  .  Omega-3 Fatty Acids (FISH OIL) 1200 MG CAPS, Take 1,200 mg by mouth daily. , Disp: , Rfl:  .  pantoprazole (PROTONIX) 40 MG tablet, Take 1 tablet (40 mg total)  by mouth daily., Disp: 90 tablet, Rfl: 3 .  ranitidine (ZANTAC) 150 MG tablet, Take 150 mg by mouth at bedtime., Disp: , Rfl:  .  thiamine (VITAMIN B-1) 100 MG tablet, Take 1 tablet (100 mg total) by mouth daily. (Patient taking differently: Take 100 mg See admin instructions by mouth. Monday, Wednesday and Friday), Disp: 30 tablet, Rfl: 5  Exam: Current vital signs: BP (!) 205/84   Pulse 69   Temp 98.3 F (36.8 C) (Oral)   Resp 16   Ht '5\' 7"'$  (1.702 m)   Wt 69.4 kg   SpO2 97%   BMI 23.96 kg/m  Vital signs in last 24 hours: Temp:  [98.3 F (36.8 C)] 98.3 F (  36.8 C) (08/18 1750) Pulse Rate:  [69] 69 (08/18 1750) Resp:  [16] 16 (08/18 1750) BP: (205)/(84) 205/84 (08/18 1750) SpO2:  [96 %-97 %] 97 % (08/18 1750) Weight:  [69.4 kg] 69.4 kg (08/18 1748) General: Patient is awake, alert in no apparent distress. HEENT: Normocephalic, atraumatic Cardiovascular system: S1-S2 heard, regular rate rhythm Respiratory system: Chest clear to auscultation Extremities: Warm well perfused with no edema Abdomen: Nondistended nontender Neurological exam Patient is awake, alert, oriented to self and oriented to the fact that she is at the hospital.  Could not name what hospital she is at.  Could not tell me the month.  Could not tell me the name of the president. She has a hard time with naming things.  She follows simple commands inconsistently.  Cannot follow multistep commands. Poor attention concentration. Cranial nerves: Pupils equal round reactive to light, extra ocular movements intact, visual fields full, facial sensation intact, face symmetric, auditory acuity intact, palate elevates midline, shoulder shrug symmetric, palate symmetric, tongue midline. Motor exam: 5/5 all over with no drift. Sensory exam: Intact to light touch and noxious stim all over Coordination: Intact finger-nose-finger and heel-knee-shin bilaterally Gait testing was deferred at this time.  NIHSS 1a Level of  Conscious.: 0 1b LOC Questions: 2 1c LOC Commands: 1 2 Best Gaze: 0 3 Visual: 0 4 Facial Palsy: 0 5a Motor Arm - left: 0 5b Motor Arm - Right: 0 6a Motor Leg - Left: 0 6b Motor Leg - Right: 0 7 Limb Ataxia: 0 8 Sensory: 0 9 Best Language: 2 10 Dysarthria: 0 11 Extinct. and Inatten.: 0 TOTAL: 5  Labs I have reviewed labs in epic and the results pertinent to this consultation are: CBC    Component Value Date/Time   WBC 8.4 10/28/2017 1241   RBC 4.25 10/28/2017 1241   HGB 12.7 10/28/2017 1241   HCT 38.2 10/28/2017 1241   PLT 209.0 10/28/2017 1241   MCV 90.0 10/28/2017 1241   MCH 30.4 10/13/2017 0602   MCHC 33.3 10/28/2017 1241   RDW 14.5 10/28/2017 1241   LYMPHSABS 2.0 10/28/2017 1241   MONOABS 0.7 10/28/2017 1241   EOSABS 0.3 10/28/2017 1241   BASOSABS 0.1 10/28/2017 1241   CMP     Component Value Date/Time   NA 136 10/28/2017 1241   NA 142 06/12/2015   K 4.2 10/28/2017 1241   CL 100 10/28/2017 1241   CO2 28 10/28/2017 1241   GLUCOSE 239 (H) 10/28/2017 1241   BUN 27 (H) 10/28/2017 1241   CREATININE 1.84 (H) 10/28/2017 1241   CREATININE 1.70 (H) 09/13/2013 1102   CALCIUM 9.7 10/28/2017 1241   PROT 7.4 10/28/2017 1241   ALBUMIN 3.8 10/28/2017 1241   AST 15 10/28/2017 1241   ALT 13 10/28/2017 1241   ALKPHOS 72 10/28/2017 1241   BILITOT 0.9 10/28/2017 1241   GFRNONAA 23 (L) 10/13/2017 0602   GFRAA 27 (L) 10/13/2017 0602    Lipid Panel     Component Value Date/Time   CHOL 182 09/28/2017 1129   TRIG 196.0 (H) 09/28/2017 1129   HDL 42.70 09/28/2017 1129   CHOLHDL 4 09/28/2017 1129   VLDL 39.2 09/28/2017 1129   LDLCALC 100 (H) 09/28/2017 1129   LDLDIRECT 128.0 01/26/2017 1114     Imaging I have reviewed the images obtained:  CT-scan of the brain shows no acute intracranial process.  Aspects 10.  Chronic right PCA stroke, right posterior border zone posterior and small basal ganglia infarct.  Severe  atherosclerosis.  Assessment:  84 year old woman  past history of coronary artery disease, chronic kidney disease for, diabetes, dementia, depression, hyperlipidemia, hypertension, admission in November for concern of stroke status post TPA but MRI negative for stroke, and event in 2014 concerning was seizure but deemed to be secondary to hypoperfusion secondary to hypotension, brought in for altered mental status. She has a nonfocal motor or sensory exam.  She has poor attention concentration and has some aphasia which is secondary to her inattention. She has evidence of old strokes in multiple territories and could very well about a new stroke but I could not really elicit focal findings that would warrant me to use IV TPA. She also has deranged renal function and not really a clinical exam suggestive of large vessel occlusion, hence I did not pursue CT angiogram head and neck. Likely toxic metabolic encephalopathy versus new stroke. Also needs evaluation for possible underlying seizures as had had some seizure-like event many years ago which was not deemed to be seizure but given dementia, cognitive impairment, multiple episodes of altered mental status-it should be a consideration.  Impression: Toxic metabolic encephalopathy Evaluate for seizure Evaluate for stroke  Recommendations: Admit on telemetry Frequent neurochecks MRI brain without contrast CBC, BMP Ammonia levels TSH B12 RPR Routine EEG Avoid sedating medications Urinalysis Chest x-ray Maintain seizure precautions although no witnessed seizures.  I discussed my plan in detail with her son at [employer]. I also relayed the plan to the emergency room attending Dr. Roderic Palau in person. Please call neurology with questions Neuro hospitalist service will follow with you.  If the MRI reveals a stroke, the patient will be followed by the stroke team in the a.m.  -- Amie Portland, MD [facility] Pager: [phone] If 7pm to 7am, please call on call as  listed on AMION.  CRITICAL CARE ATTESTATION This patient is critically ill and at significant risk of neurological worsening, death and care requires constant monitoring of vital signs, hemodynamics,respiratory and cardiac monitoring. I spent 40  minutes of neurocritical care time performing neurological assessment, discussion with family, other specialists and medical decision making of high complexityin the care of  this patient.

## 2017-10-31 NOTE — ED Notes (Signed)
Patient transported to X-ray 

## 2017-10-31 NOTE — ED Triage Notes (Signed)
Pt to ED from home for AMS. Pt lives alone and was found on her alarm camera sitting wiith hands in her face. Pt A/o x2 on arrival. NAD. Last seen normal 1:30 pm today. Son reports pt had "TIA" today that lasted 15 mins with hx of same.

## 2017-10-31 NOTE — ED Notes (Signed)
[facility] called to activate code stroke 

## 2017-11-01 ENCOUNTER — Observation Stay (HOSPITAL_COMMUNITY): Payer: Medicare Other

## 2017-11-01 DIAGNOSIS — G9340 Encephalopathy, unspecified: Secondary | ICD-10-CM

## 2017-11-01 DIAGNOSIS — I2510 Atherosclerotic heart disease of native coronary artery without angina pectoris: Secondary | ICD-10-CM | POA: Diagnosis not present

## 2017-11-01 DIAGNOSIS — I25810 Atherosclerosis of coronary artery bypass graft(s) without angina pectoris: Secondary | ICD-10-CM | POA: Diagnosis not present

## 2017-11-01 DIAGNOSIS — E119 Type 2 diabetes mellitus without complications: Secondary | ICD-10-CM | POA: Diagnosis not present

## 2017-11-01 DIAGNOSIS — Z8673 Personal history of transient ischemic attack (TIA), and cerebral infarction without residual deficits: Secondary | ICD-10-CM | POA: Diagnosis not present

## 2017-11-01 DIAGNOSIS — I10 Essential (primary) hypertension: Secondary | ICD-10-CM | POA: Diagnosis not present

## 2017-11-01 DIAGNOSIS — I130 Hypertensive heart and chronic kidney disease with heart failure and stage 1 through stage 4 chronic kidney disease, or unspecified chronic kidney disease: Secondary | ICD-10-CM | POA: Diagnosis not present

## 2017-11-01 DIAGNOSIS — I480 Paroxysmal atrial fibrillation: Secondary | ICD-10-CM | POA: Diagnosis not present

## 2017-11-01 DIAGNOSIS — F0391 Unspecified dementia with behavioral disturbance: Secondary | ICD-10-CM | POA: Diagnosis not present

## 2017-11-01 DIAGNOSIS — R4182 Altered mental status, unspecified: Secondary | ICD-10-CM | POA: Diagnosis not present

## 2017-11-01 DIAGNOSIS — R4789 Other speech disturbances: Secondary | ICD-10-CM | POA: Diagnosis not present

## 2017-11-01 DIAGNOSIS — R410 Disorientation, unspecified: Secondary | ICD-10-CM | POA: Diagnosis not present

## 2017-11-01 DIAGNOSIS — N184 Chronic kidney disease, stage 4 (severe): Secondary | ICD-10-CM | POA: Diagnosis not present

## 2017-11-01 LAB — BASIC METABOLIC PANEL
Anion gap: 9 (ref 5–15)
BUN: 23 mg/dL (ref 8–23)
CHLORIDE: 106 mmol/L (ref 98–111)
CO2: 26 mmol/L (ref 22–32)
CREATININE: 1.68 mg/dL — AB (ref 0.44–1.00)
Calcium: 9 mg/dL (ref 8.9–10.3)
GFR calc Af Amer: 31 mL/min — ABNORMAL LOW (ref >60)
GFR calc non Af Amer: 27 mL/min — ABNORMAL LOW (ref >60)
GLUCOSE: 99 mg/dL (ref 70–99)
Potassium: 3.5 mmol/L (ref 3.5–5.1)
SODIUM: 141 mmol/L (ref 135–145)

## 2017-11-01 LAB — BRAIN NATRIURETIC PEPTIDE: B Natriuretic Peptide: 277.5 pg/mL — ABNORMAL HIGH (ref 0.0–100.0)

## 2017-11-01 LAB — GLUCOSE, CAPILLARY
GLUCOSE-CAPILLARY: 92 mg/dL (ref 70–99)
GLUCOSE-CAPILLARY: 130 mg/dL — AB (ref 70–99)
Glucose-Capillary: 101 mg/dL — ABNORMAL HIGH (ref 70–99)
Glucose-Capillary: 129 mg/dL — ABNORMAL HIGH (ref 70–99)

## 2017-11-01 LAB — RPR: RPR Ser Ql: NONREACTIVE

## 2017-11-01 LAB — HIV ANTIBODY (ROUTINE TESTING W REFLEX): HIV SCREEN 4TH GENERATION: NONREACTIVE

## 2017-11-01 LAB — VITAMIN B12: VITAMIN B 12: 333 pg/mL (ref 180–914)

## 2017-11-01 MED ORDER — VITAMIN B-12 1000 MCG PO TABS
1000.0000 ug | ORAL_TABLET | Freq: Every day | ORAL | Status: DC
  Administered 2017-11-01 – 2017-11-02 (×2): 1000 ug via ORAL
  Filled 2017-11-01 (×2): qty 1

## 2017-11-01 MED ORDER — AMLODIPINE BESYLATE 10 MG PO TABS
10.0000 mg | ORAL_TABLET | Freq: Every day | ORAL | Status: DC
  Administered 2017-11-02 – 2017-11-05 (×4): 10 mg via ORAL
  Filled 2017-11-01 (×4): qty 1

## 2017-11-01 MED ORDER — AMLODIPINE BESYLATE 5 MG PO TABS
5.0000 mg | ORAL_TABLET | Freq: Once | ORAL | Status: AC
  Administered 2017-11-01: 5 mg via ORAL
  Filled 2017-11-01: qty 1

## 2017-11-01 NOTE — Progress Notes (Signed)
EEG completed; results pending.    

## 2017-11-01 NOTE — Progress Notes (Addendum)
Subjective: Patient is alert but very confused.  Has difficulty following commands.  Exam: Vitals:   11/01/17 0558 11/01/17 0744  BP: (!) 174/57 (!) 168/42  Pulse: (!) 46 (!) 45  Resp:  18  Temp:  98.6 F (37 C)  SpO2: 96% 95%    Physical Exam   HEENT-  Normocephalic, no lesions, without obvious abnormality.  Normal external eye and conjunctiva.   Extremities- Warm, dry and intact Musculoskeletal-no joint tenderness, deformity or swelling Skin-warm and dry, no hyperpigmentation, vitiligo, or suspicious lesions    Neuro:  Mental Status: Patient is alert at this time, she is aware that she is in the hospital, she does not know the date nor the month and when asked the date she says 9:00.  Interestingly enough just before answering questions she laughs inappropriately.  She gets her left and her right hand and thumb mixed up.  She is unable to do anything but simple commands. Cranial Nerves: II:  Visual fields grossly normal,  III,IV, VI: ptosis not present, extra-ocular motions intact bilaterally pupils equal, round, reactive to light and accommodation V,VII: smile symmetric, facial light touch sensation normal bilaterally VIII: hearing normal bilaterally IX,X: Soft palate rises symmetrically XI: bilateral shoulder shrug XII: midline tongue extension Motor: Doing all extremities with four 4+/5 strength. Tone and bulk:normal tone throughout; no atrophy noted Sensory: Pinprick and light touch intact throughout, bilaterally Plantars: Right: downgoing  Left: downgoing Cerebellar: normal finger-to-nose  Gait: Not tested    Medications:  Scheduled: . amLODipine  5 mg Oral Daily  . aspirin EC  81 mg Oral Daily  . atorvastatin  20 mg Oral q1800  . cholecalciferol  1,000 Units Oral Daily  . citalopram  20 mg Oral Daily  . famotidine  20 mg Oral QHS  . heparin  5,000 Units Subcutaneous Q8H  . insulin aspart  0-5 Units Subcutaneous QHS  . insulin aspart  0-9 Units Subcutaneous  TID WC  . insulin glargine  15 Units Subcutaneous QHS  . isosorbide mononitrate  30 mg Oral Daily  . omega-3 acid ethyl esters  1 g Oral Daily  . pantoprazole  40 mg Oral Daily  . sodium chloride flush  3 mL Intravenous Q12H  . sodium chloride flush  3 mL Intravenous Q12H  . thiamine  100 mg Oral Q M,W,F   Continuous: . sodium chloride    . dextrose 5 % and 0.9% NaCl 50 mL/hr at 11/01/17 0349    Pertinent Labs/Diagnostics: RPR active B12 333 TSH 1.328 Ammonia 17 Thiamine pending    OAC:[id number] The EEG is abnormal and findings are consistent with mild generalized cerebral dysfunction.  Epileptiform features were not seen during this recording  Mr Brain Wo Contrast  Result Date: 11/01/2017. IMPRESSION: 1. No acute intracranial abnormality. 2. Moderate atrophy with chronic small vessel ischemic disease with multiple chronic infarcts as above, stable from previous. Electronically Signed   By: Jeannine Boga M.D.   On: 11/01/2017 03:47     Destiny Quill PA-C [facility] [phone]   Assessment: 84 year old female presenting with altered mental status, slurred and garbled speech. 1. Etiology most likely toxic/metabolic encephalopathy.  2. No new stroke seen on MRI.   3. EEG without epileptiform discharges, but with findings consistent with mild generalized cerebral dysfunction. 4. History of dementia.     Recommendations: - Thiamine level, then initiate high-dose thiamine for 3 days at 500 mg IV TID, then switch to 100 mg PO QD to be continued indefinitely.  -  B12 is low by neurological standards at 333. We recommend B12 supplementation at 2 mg po qd. -- Other recommendations as per Dr. Johny Chess initial consult note from 8/18. -- Neurology will sign off for now. Please call if there are additional questions.   Electronically signed: Dr. Kerney Elbe 11/01/2017, 10:01 AM

## 2017-11-01 NOTE — Evaluation (Signed)
Clinical/Bedside Swallow Evaluation Patient Details  Name: Destiny [name] MRN: [id number] Date of Birth: 06/05/1931  Today's Date: 11/01/2017 Time: SLP Start Time (ACUTE ONLY): 0840 SLP Stop Time (ACUTE ONLY): 0850 SLP Time Calculation (min) (ACUTE ONLY): 10 min  Past Medical History:  Past Medical History:  Diagnosis Date  . Acute renal insufficiency 12/24/2013  . Amaurosis fugax   . Arthritis 12/06/2012  . Atherosclerosis   . CAD (coronary artery disease)    a. s/p CABG x 4 (LIMA->LAD, VG->Diag, VG->OM1->OM3);  b. 2010 Cath: 3/4 patent grafts (VG->diag occluded), 3vd, sev PVD ->Med Rx;  c. 12/2011 Lexi MV: sm area of mild mid and apical ant wall ischemia ->med rx.  . Carotid bruit   . Chronic kidney disease (CKD) stage G4/A1, severely decreased glomerular filtration rate (GFR) between 15-29 mL/min/1.73 square meter and albuminuria creatinine ratio less than 30 mg/g (HCC)    GFR 25 on 12/25/13  . CVA (cerebral infarction)   . Dementia    pt denies  . Depression 12/24/2013  . Diabetes mellitus type II    insulin dependent.   . Diabetic peripheral neuropathy ([place])   . Diabetic retinopathy   . Diverticulosis   . Duodenal ulcer 2009 and 11/2011   caused GI bleeding  . Dyspnea   . Gallstones 2009   seen on CT scan  . GI bleed 04/2011, 11/2011   found non-bleeding duodenal ulcers  . Hyperlipidemia   . Hypersomnolence 03/12/2013  . Hypertension   . Iron deficiency anemia secondary to blood loss (chronic)   . Melena 1015/2015   PEPTIC ULCER    REQUIRING TRANSFUSION  . Pedal edema 10/12/2016  . PVD (peripheral vascular disease) ([place])   . Rib fracture 04/21/2015  . Stroke ([place])   . Thiamine deficiency 08/13/2015  . UTI (urinary tract infection) 04/08/2014  . Weight loss, non-intentional 01/29/2014   Past Surgical History:  Past Surgical History:  Procedure Laterality Date  . ABDOMINAL HYSTERECTOMY    . COLONOSCOPY  05/20/2011   Procedure: COLONOSCOPY;  Surgeon: Scarlette Shorts, MD;   Location: [facility];  Service: Endoscopy;  Laterality: N/A;  . COLONOSCOPY WITH PROPOFOL N/A 06/09/2017   Procedure: COLONOSCOPY WITH PROPOFOL;  Surgeon: Milus Banister, MD;  Location: [facility] ENDOSCOPY;  Service: Endoscopy;  Laterality: N/A;  . CORONARY ARTERY BYPASS GRAFT  1999   x 4  . ENTEROSCOPY N/A 12/29/2013   Procedure: ENTEROSCOPY;  Surgeon: Inda Castle, MD;  Location: [facility];  Service: Endoscopy;  Laterality: N/A;  . ESOPHAGOGASTRODUODENOSCOPY  05/16/2011   Procedure: ESOPHAGOGASTRODUODENOSCOPY (EGD);  Surgeon: Gatha Mayer, MD;  Location: [facility] ENDOSCOPY;  Service: Endoscopy;  Laterality: N/A;  . ESOPHAGOGASTRODUODENOSCOPY  12/08/2011   Procedure: ESOPHAGOGASTRODUODENOSCOPY (EGD);  Surgeon: Ladene Artist, MD,FACG;  [facility] END[facility]COPY;  Service: Endoscopy;  Laterality: N/A;  . ESOPHAGOGASTRODUODENOSCOPY (EGD) WITH PROPOFOL N/A 06/08/2017   Procedure: ESOPHAGOGASTRODUODENOSCOPY (EGD) WITH PROPOFOL;  Surgeon: Milus Banister, MD;  Location: [facility] ENDOSCOPY;  Service: Endoscopy;  Laterality: N/A;  . UPPER GASTROINTESTINAL ENDOSCOPY  02/22/2008   w/biopsy, duedenal ulcer, antrum erosions   HPI:  Destiny Even Brooksis a 84 y.o.femalepast medical history of coronary artery disease, chronic kidney disease 4, diabetes, dementia, depression, hyperlipidemia, hypertension, recent admission in November for similar presentation where she got TPA, MRI was negative for stroke at the time, brought in for evaluation of altered mental status. Of note, she also has a history of seizures in 2014-which was deemed to be secondary to hypoperfusion and not true  epileptic event and no anticonvulsant was started. According to son, last known normal at 1:30 PM. Then noted by family to be acting confused. Speech appeared to be garbled as well as slurred. Similar presentation to what had brought her in the hospital in November. The family says she has had a bunch of strokes and mini strokes in the past. Her  CAT scan from last admission reviewed and reveals multiple territories chronic infarcts. MRI revealed no acute intracranial abnormality and moderate atrophy with chronic small vessel ischemic disease with multiple chronic infarcts as above, stable from previous.   Assessment / Plan / Recommendation Clinical Impression  Pt presents with functional oropharyngeal abilities as evidenced by good oral ability when consuming graham crackers and no overt s/s of aspiraiton with consuming thin liquids (8 oz) via straw with multiple consecutive sips. Pt is currently edentulous but states that she wears dentures at home (unsure of accuracy given current confusion). Recommend pt be upgraded to regular diet with thin liquids. If pt demonstrates decreased tolerance d/t edentulous status can downgrade to dysphagia 3 while hospitalized. No further service is indicated for dysphagia. If encephalopathy doesn't clear and confusion remains, may wish to consider SLE.   SLP Visit Diagnosis: Dysphagia, unspecified (R13.10)    Aspiration Risk  No limitations    Diet Recommendation Regular;Thin liquid   Liquid Administration via: Straw;Cup Medication Administration: Whole meds with liquid Supervision: Patient able to self feed Compensations: Minimize environmental distractions;Slow rate;Small sips/bites Postural Changes: Seated upright at 90 degrees    Other  Recommendations Oral Care Recommendations: Oral care BID   Follow up Recommendations None        Swallow Study   General Date of Onset: 10/31/17 HPI: Destiny Goodchild Brooksis a 84 y.o.femalepast medical history of coronary artery disease, chronic kidney disease 4, diabetes, dementia, depression, hyperlipidemia, hypertension, recent admission in November for similar presentation where she got TPA, MRI was negative for stroke at the time, brought in for evaluation of altered mental status. Of note, she also has a history of seizures in 2014-which was deemed to be  secondary to hypoperfusion and not true epileptic event and no anticonvulsant was started. According to son, last known normal at 1:30 PM. Then noted by family to be acting confused. Speech appeared to be garbled as well as slurred. Similar presentation to what had brought her in the hospital in November. The family says she has had a bunch of strokes and mini strokes in the past. Her CAT scan from last admission reviewed and reveals multiple territories chronic infarcts. MRI revealed no acute intracranial abnormality and moderate atrophy with chronic small vessel ischemic disease with multiple chronic infarcts as above, stable from previous. Type of Study: Bedside Swallow Evaluation Previous Swallow Assessment: none in chart Diet Prior to this Study: NPO(coughing on RNSS) Temperature Spikes Noted: No Respiratory Status: Room air History of Recent Intubation: No Behavior/Cognition: Alert;Confused;Pleasant mood Oral Cavity Assessment: Within Functional Limits Oral Care Completed by SLP: No Oral Cavity - Dentition: Edentulous(reports that she has dentures at home) Vision: Functional for self-feeding Self-Feeding Abilities: Able to feed self Patient Positioning: Upright in bed Baseline Vocal Quality: Normal Volitional Cough: Cognitively unable to elicit Volitional Swallow: Unable to elicit    Oral/Motor/Sensory Function Overall Oral Motor/Sensory Function: Within functional limits   Ice Chips Ice chips: Within functional limits Presentation: Spoon;Self Fed   Thin Liquid Thin Liquid: Within functional limits Presentation: Straw;Self Fed    Nectar Thick Nectar Thick Liquid: Not tested   Honey  Thick Honey Thick Liquid: Not tested   Puree Puree: Within functional limits Presentation: Self Fed;Spoon   Solid     Solid: Within functional limits Presentation: [place] 11/01/2017,12:28 PM

## 2017-11-01 NOTE — Progress Notes (Signed)
Skin assessment done by this nurse and Delia Chimes. On head to toe assessment no pressure ulcer noted.

## 2017-11-01 NOTE — Care Management Note (Addendum)
Case Management Note  Patient Details  Name: Destiny [name] MRN: [id number] Date of Birth: [birth year]  Subjective/Objective:       Pt in with acute encephalopathy. She is from home alone. Pt has caregiver from [facility]. She is active with [facility] for [facility] PT. Pts daughter in law fills her pill box weekly. No issues with obtaining medications and no transportation issues.Per aide patient has all needed DME but does not use them at home.  PCP: Dr Randel Pigg Insurance: [facility] medicare             Action/Plan: Awaiting PT/OT evals. CM following for d/c needs, physician orders.  Expected Discharge Date:                  Expected Discharge Plan:     In-House Referral:     Discharge planning Services     Post Acute Care Choice:    Choice offered to:     DME Arranged:    DME Agency:     HH Arranged:    HH Agency:     Status of Service:  In process, will continue to follow  If discussed at Long Length of Stay Meetings, dates discussed:    Additional Comments:  Pollie Friar, RN 11/01/2017, 12:06 PM

## 2017-11-01 NOTE — Progress Notes (Signed)
Progress Note    Destiny [name]  IRJ:[id number] DOB: 08/26/1931  DOA: 10/31/2017 PCP: Mosie Lukes, MD    Brief Narrative:    Medical records reviewed and are as summarized below:  Destiny [name] is an 84 y.o. female with medical history significant for CAD, type 2 diabetes mellitus, hypertension, history of CVA, paroxysmal atrial fibrillation no longer on anticoagulation due to recurrent falls, now presenting to the emergency department for evaluation of headache with garbled speech and increased confusion.  Patient was seen in her normal state at approximately 13:30 this afternoon, later noted by family to be holding her head in her hands, with increased confusion, and garbled speech.  She was complaining of a headache.  She was brought into the ED for further evaluation, headache and slurred speech had resolved, and family reports that her confusion is nearly back to baseline in the ED.  Patient acknowledges having a headache earlier, described as localized to the top of her head, but reports that that is resolved.    Assessment/Plan:   Principal Problem:   Acute encephalopathy Active Problems:   Essential hypertension   Diabetes mellitus type 2, insulin dependent (HCC)   CAD (coronary artery disease) of artery bypass graft   CKD (chronic kidney disease) stage 4, GFR 15-29 ml/min (HCC)   History of CVA (cerebrovascular accident)   Chronic diastolic heart failure (HCC)   AF (paroxysmal atrial fibrillation) (HCC)  Acute encephalopathy  - Presents following an episode of transient confusion and slurred speech, complaining of headache at the time and holding her head in her hands  - Head CT is negative for acute findings, notable for chronic ischemic changes  - Headache and slurred speech resolved, mental status close to baseline per son at bedside-- ? TIA- long history of these in the past  - Neurology consult-- replace B12, await thiamine level - EEG w/o seizure like  activity  History of CVA  - Presents after transient neurologic changes that were non-focal - MRI: 1.  No acute intracranial abnormality. 2. Moderate atrophy with chronic small vessel ischemic disease with multiple chronic infarcts as above, stable from previous - Continue statin, ASA    Paroxysmal atrial fibrillation  - In a sinus rhythm on admission  - CHADS-VASc 74 (age x2, CVA x2, gender, CHF, DM, CAD, HTN) - Per chart notes from 09/28/17, she was taken off of anticoagulation due to balance issues, refusal to use walker, and recurrent falls   -HR low-- check EKG  Hypertension  - BP elevated in ED, possibly d/t pain, less likely from acute CVA - increase norvasc  Insulin-dependent DM  - A1c was 7.5% last month  - lower dose lantus -SSI -regular diet for now-- change to diabetic diet if blood sugars elevated  CKD stage IV  - trend  CAD  - No anginal complaints  - Continue Lipitor, ASA 81, and Imdur    Chronic diastolic CHF  - Preserved EF on echo on last month  - Appears compensated, uses Lasix only prn at home  - Hold Lasix, follow daily wt   -check BNP  Urine cx with >100,000 colonies of coag negative staph  Family Communication/Anticipated D/C date and plan/Code Status   DVT prophylaxis: Lovenox ordered. Code Status: Full Code.  Family Communication: called son Disposition Plan: pending PT Eval (lives home alone but does have family support)   Medical Consultants:    Neuro     Subjective:   Seems to be  back at mental baseline-- per son knows who she is but never day of the week or president   Objective:    Vitals:   11/01/17 0558 11/01/17 0641 11/01/17 0744 11/01/17 1151  BP: (!) 174/57  (!) 168/42 (!) 181/54  Pulse: (!) 46  (!) 45 (!) 47  Resp:   18 18  Temp:   98.6 F (37 C) 98.1 F (36.7 C)  TempSrc:   Oral Oral  SpO2: 96%  95% 98%  Weight:  71.8 kg    Height:        Intake/Output Summary (Last 24 hours) at 11/01/2017  1607 Last data filed at 11/01/2017 0700 Gross per 24 hour  Intake 666.15 ml  Output -  Net 666.15 ml   Filed Weights   10/31/17 1748 11/01/17 0641  Weight: 69.4 kg 71.8 kg    Exam: In bed, NAD Pleasant and cooperative Laughs when asked the year/president-- gives phone number for nurse HR slow but regular No wheezing, no increased work of breathing  Data Reviewed:   I have personally reviewed following labs and imaging studies:  Labs: Labs show the following:   Basic Metabolic Panel: Recent Labs  Lab 10/28/17 1241 10/31/17 06/24/2138 11/01/17 0606  NA 136 138 141  K 4.2 3.5 3.5  CL 100 104 106  CO2 28 26 26   GLUCOSE 239* 87 99  BUN 27* 24* 23  CREATININE 1.84* 1.93* 1.68*  CALCIUM 9.7 9.2 9.0   GFR Estimated Creatinine Clearance: 24.2 mL/min (A) (by C-G formula based on SCr of 1.68 mg/dL (H)). Liver Function Tests: Recent Labs  Lab 10/28/17 1241 10/31/17 2140  AST 15 16  ALT 13 13  ALKPHOS 72 64  BILITOT 0.9 1.3*  PROT 7.4 6.6  ALBUMIN 3.8 3.3*   No results for input(s): LIPASE, AMYLASE in the last 168 hours. Recent Labs  Lab 10/31/17 2140  AMMONIA 17   Coagulation profile Recent Labs  Lab 10/31/17 1827 10/31/17 1940  INR 1.04 1.09    CBC: Recent Labs  Lab 10/28/17 1241 10/31/17 1940  WBC 8.4 8.3  NEUTROABS 5.3 5.4  HGB 12.7 11.9*  HCT 38.2 36.6  MCV 90.0 91.5  PLT 209.0 197   Cardiac Enzymes: No results for input(s): CKTOTAL, CKMB, CKMBINDEX, TROPONINI in the last 168 hours. BNP (last 3 results) Recent Labs    05/21/17 1430  PROBNP 210.0*   CBG: Recent Labs  Lab 10/31/17 06/23/45 10/31/17 2234 11/01/17 0348 11/01/17 0728 11/01/17 1133  GLUCAP 82 76 92 101* 130*   D-Dimer: No results for input(s): DDIMER in the last 72 hours. Hgb A1c: No results for input(s): HGBA1C in the last 72 hours. Lipid Profile: No results for input(s): CHOL, HDL, LDLCALC, TRIG, CHOLHDL, LDLDIRECT in the last 72 hours. Thyroid function  studies: Recent Labs    10/31/17 2138/06/24  TSH 1.328   Anemia work up: Recent Labs    10/31/17 06/24/2138  VITAMINB12 333   Sepsis Labs: Recent Labs  Lab 10/28/17 1241 10/31/17 1817 10/31/17 1940  WBC 8.4  --  8.3  LATICACIDVEN  --  1.06  --     Microbiology Recent Results (from the past 240 hour(s))  Urine Culture     Status: Abnormal   Collection Time: 10/28/17 11:29 AM  Result Value Ref Range Status   MICRO NUMBER: [id number]  Final   SPECIMEN QUALITY: ADEQUATE  Final   Sample Source NOT GIVEN  Final   STATUS: FINAL  Final   ISOLATE 1:  Coagulase negative staphylococcus, not S. (A)  Final    Comment: Greater than 100,000 CFU/mL of Coagulase negative staphylococcus, not S. saprophyticus      Susceptibility   Coagulase negative staphylococcus, not s. - URINE CULTURE POSITIVE 1    VANCOMYCIN 1 Sensitive     CIPROFLOXACIN <=0.5 Sensitive     LEVOFLOXACIN <=0.12 Sensitive     GENTAMICIN <=0.5 Sensitive     NITROFURANTOIN <=16 Sensitive     OXACILLIN* NR Resistant      * Oxacillin-resistant staphylococci are resistant toall currently available beta-lactam antimicrobialagents including penicillins, beta lactam/beta-lactamase inhibitor combinations, and cephems withstaphylococcal indications, including Cefazolin.    TETRACYCLINE 2 Sensitive     TRIMETH/SULFA* 160 Resistant      * Oxacillin-resistant staphylococci are resistant toall currently available beta-lactam antimicrobialagents including penicillins, beta lactam/beta-lactamase inhibitor combinations, and cephems withstaphylococcal indications, including Cefazolin.Legend:S = Susceptible  I = IntermediateR = Resistant  NS = Not susceptible* = Not tested  NR = Not reported**NN = See antimicrobic comments    Procedures and diagnostic studies:  Dg Chest 2 View  Result Date: 10/31/2017 CLINICAL DATA:  Altered mental status EXAM: CHEST - 2 VIEW COMPARISON:  October 12, 2017 FINDINGS: No evident edema or consolidation. Heart is mildly  enlarged with pulmonary vascularity normal. Patient is status post coronary artery bypass grafting. There is aortic atherosclerosis. No pneumothorax. No evident bone lesions. IMPRESSION: No edema or consolidation. Heart mildly enlarged. There is aortic atherosclerosis. Aortic Atherosclerosis (ICD10-I70.0). Electronically Signed   By: Lowella Grip III M.D.   On: 10/31/2017 20:45   Mr Brain Wo Contrast  Result Date: 11/01/2017 CLINICAL DATA:  Initial evaluation for acute encephalopathy, headache, garbled speech, increased confusion. EXAM: MRI HEAD WITHOUT CONTRAST TECHNIQUE: Multiplanar, multiecho pulse sequences of the brain and surrounding structures were obtained without intravenous contrast. COMPARISON:  Prior CT from 10/31/2017 as well as previous MRI from 05/22/2017. FINDINGS: Brain: Moderately advanced cerebral atrophy. Patchy and confluent T2/FLAIR hyperintensity within the periventricular and deep white matter, most consistent with chronic small vessel ischemic disease. Multiple remote lacunar infarcts within the left greater than right basal ganglia/corona radiata. Chronic right occipital infarcts. Additional tiny remote left cerebellar infarct. No abnormal foci of restricted diffusion to suggest acute or subacute ischemia. Gray-white matter differentiation maintained. No evidence for acute intracranial hemorrhage. No mass lesion, midline shift or mass effect. Ventricular prominence related to global parenchymal volume loss without hydrocephalus. Cavum et septum pellucidum noted. Incidental note made of a partially empty sella. 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 Major intravascular flow voids otherwise maintained. Skull and upper cervical spine: Craniocervical junction normal. Upper cervical spine within normal limits. Bone marrow signal intensity normal. Scalp soft tissues within normal limits.  Sinuses/Orbits: Patient status post ocular lens replacement bilaterally. Paranasal sinuses are clear. No significant mastoid effusion. Other: None. IMPRESSION: 1. No acute intracranial abnormality. 2. Moderate atrophy with chronic small vessel ischemic disease with multiple chronic infarcts as above, stable from previous. Electronically Signed   By: Jeannine Boga M.D.   On: 11/01/2017 03:47   Ct Head Code Stroke Wo Contrast  Result Date: 10/31/2017 CLINICAL DATA:  Code stroke. Last seen normal at 13 30 hours. Follow up stroke. History of stroke, dementia, hypertension, hyperlipidemia, diabetes. EXAM: CT HEAD WITHOUT CONTRAST TECHNIQUE: Contiguous axial images were obtained from the base of the skull through the vertex without intravenous contrast. COMPARISON:  CT HEAD October 28, 2017  and MRI head May 22, 2017. FINDINGS: BRAIN: No intraparenchymal hemorrhage, mass effect nor midline shift. Moderate parenchymal brain volume loss, cavum septum pellucidum. Advanced mid brain volume loss. RIGHT mesial temporal occipital lobe encephalomalacia. Adjacent RIGHT parietal lobe encephalomalacia. Old LEFT greater than RIGHT basal ganglia cystic infarct with ex vacuo dilatation subjacent ventricle. Patchy supratentorial white matter hypodensities within normal range for patient's age, though non-specific are most compatible with chronic small vessel ischemic disease. No acute large vascular territory infarcts. No abnormal extra-axial fluid collections. Basal cisterns are patent. VASCULAR: Severe calcific atherosclerosis of the carotid siphons. SKULL: No skull fracture. No significant scalp soft tissue swelling. SINUSES/ORBITS: Trace paranasal sinus mucosal thickening. Mastoid air cells are well aerated.The included ocular globes and orbital contents are non-suspicious. Status post bilateral ocular lens implants. OTHER: Patient is edentulous. ASPECTS [facility] Stroke Program Early CT Score) - Ganglionic level infarction  (caudate, lentiform nuclei, internal capsule, insula, M1-M3 cortex): 7 - Supraganglionic infarction (M4-M6 cortex): 3 Total score (0-10 with 10 being normal): 10 IMPRESSION: 1. No acute intracranial process. 2. ASPECTS is 10. 3. Chronic changes including old RIGHT PCA territory, RIGHT posterior border zone posterior and small basal ganglia infarcts. 4. Severe atherosclerosis. 5. Critical Value/emergent results text paged to [place] via AMION secure system on 10/31/2017 at 7:17 pm, including interpreting physician's phone number. Electronically Signed   By: Elon Alas M.D.   On: 10/31/2017 19:19    Medications:   . [START ON 11/02/2017] amLODipine  10 mg Oral Daily  . amLODipine  5 mg Oral Once  . aspirin EC  81 mg Oral Daily  . atorvastatin  20 mg Oral q1800  . cholecalciferol  1,000 Units Oral Daily  . citalopram  20 mg Oral Daily  . famotidine  20 mg Oral QHS  . heparin  5,000 Units Subcutaneous Q8H  . insulin aspart  0-5 Units Subcutaneous QHS  . insulin aspart  0-9 Units Subcutaneous TID WC  . insulin glargine  15 Units Subcutaneous QHS  . isosorbide mononitrate  30 mg Oral Daily  . omega-3 acid ethyl esters  1 g Oral Daily  . pantoprazole  40 mg Oral Daily  . sodium chloride flush  3 mL Intravenous Q12H  . sodium chloride flush  3 mL Intravenous Q12H  . thiamine  100 mg Oral Q M,W,F  . vitamin B-12  1,000 mcg Oral Daily   Continuous Infusions: . sodium chloride       LOS: 0 days   Geradine Girt  [facility]   *Please refer to [email or web address], password [email or web address] to get updated schedule on who will round on this patient, as hospitalists switch teams weekly. If 7PM-7AM, please contact night-coverage at [email or web address], password [email or web address] for any overnight needs.  11/01/2017, 4:07 PM

## 2017-11-01 NOTE — Procedures (Addendum)
Date of recording 11/01/2017  Referring physician Opyd  Reason for the study Altered mental status  Technical Digital EEG recording using 10-20 international electrode system  Description of the recording Posterior dominant rhythm is 6-8Hz  symmetrical reactive EEG comprises of intermittent generalized delta slowing Epileptiform features were not seen during this recording.  Impression  The EEG is abnormal and findings are consistent with mild generalized cerebral dysfunction.  Epileptiform features were not seen during this recording.

## 2017-11-01 NOTE — Evaluation (Signed)
Physical Therapy Evaluation Patient Details Name: Destiny [name] MRN: [id number] DOB: [birth year] Today's Date: 11/01/2017   History of Present Illness  84 y.o. female was admitted for HA, speech changes, and acute encephalopathy with confusion and noted abnormal EEG with no seizures.  PMHx:  CKD, CAD, dementia, DM, HTN, stroke  Clinical Impression  Pt is up to side of bed, weak and needing repositioning to sit and then to be comfortable in bed.  Have plan to see pt acutely then to transition to SNF for further balance and tolerance for being OOB.  Has shown willingness to work, and previous level is to walk with assistance at home.  Follow up with PT in SNF for completion of recovery physically and try to return home.    Follow Up Recommendations SNF    Equipment Recommendations  None recommended by PT    Recommendations for Other Services       Precautions / Restrictions Precautions Precautions: Fall Restrictions Weight Bearing Restrictions: No      Mobility  Bed Mobility Overal bed mobility: Needs Assistance Bed Mobility: Supine to Sit;Sit to Supine     Supine to sit: Mod assist Sit to supine: Mod assist      Transfers Overall transfer level: Needs assistance Equipment used: 1 person hand held assist Transfers: Sit to/from Stand Sit to Stand: Total assist         General transfer comment: cues for sequence were given with pt attempting to help stand  Ambulation/Gait             General Gait Details: unable  Stairs            Wheelchair Mobility    Modified Rankin (Stroke Patients Only)       Balance Overall balance assessment: Needs assistance Sitting-balance support: Bilateral upper extremity supported;Feet supported Sitting balance-Leahy Scale: Fair       Standing balance-Leahy Scale: Zero                               Pertinent Vitals/Pain Pain Assessment: No/denies pain    Home Living Family/patient expects to be  discharged to:: Private residence Living Arrangements: Alone Available Help at Discharge: Family;Personal care attendant Type of Home: House Home Access: Level entry Entrance Stairs-Rails: None   Home Layout: One level Home Equipment: Environmental consultant - 2 wheels      Prior Function Level of Independence: Needs assistance   Gait / Transfers Assistance Needed: help with steps from home aide  ADL's / Homemaking Assistance Needed: help with dressing and bathing  Comments: last had HH aide 5x a week but pt cannot tell PT     Hand Dominance   Dominant Hand: Right    Extremity/Trunk Assessment   Upper Extremity Assessment Upper Extremity Assessment: Generalized weakness    Lower Extremity Assessment Lower Extremity Assessment: Generalized weakness    Cervical / Trunk Assessment Cervical / Trunk Assessment: Kyphotic  Communication   Communication: No difficulties  Cognition Arousal/Alertness: Lethargic Behavior During Therapy: Flat affect Overall Cognitive Status: History of cognitive impairments - at baseline                                        General Comments General comments (skin integrity, edema, etc.): pt is weak, cannot help sequence any movement    Exercises  Assessment/Plan    PT Assessment Patient needs continued PT services  PT Problem List Decreased strength;Decreased cognition;Decreased activity tolerance;Decreased safety awareness;Decreased range of motion;Decreased balance;Decreased mobility;Decreased coordination       PT Treatment Interventions DME instruction;Gait training;Functional mobility training;Therapeutic activities;Therapeutic exercise;Balance training;Neuromuscular re-education;Patient/family education    PT Goals (Current goals can be found in the Care Plan section)  Acute Rehab PT Goals Patient Stated Goal: one stated PT Goal Formulation: With patient Time For Goal Achievement: 11/15/17 Potential to Achieve Goals:  Good    Frequency Min 2X/week   Barriers to discharge Decreased caregiver support family has assistance and pt is home with caregivers and family    Co-evaluation               AM-PAC PT "6 Clicks" Daily Activity  Outcome Measure Difficulty turning over in bed (including adjusting bedclothes, sheets and blankets)?: Unable Difficulty moving from lying on back to sitting on the side of the bed? : Unable Difficulty sitting down on and standing up from a chair with arms (e.g., wheelchair, bedside commode, etc,.)?: Unable Help needed moving to and from a bed to chair (including a wheelchair)?: Total Help needed walking in hospital room?: Total Help needed climbing 3-5 steps with a railing? : Total 6 Click Score: 6    End of Session Equipment Utilized During Treatment: Gait belt Activity Tolerance: Patient tolerated treatment well Patient left: in bed;with call bell/phone within reach Nurse Communication: Mobility status PT Visit Diagnosis: Unsteadiness on feet (R26.81);Muscle weakness (generalized) (M62.81)    Time: 7948-0165 PT Time Calculation (min) (ACUTE ONLY): 28 min   Charges:   PT Evaluation $PT Eval Moderate Complexity: 1 Mod PT Treatments $Therapeutic Activity: 8-22 mins       Ramond Dial 11/01/2017, 8:18 PM   Mee Hives, PT MS Acute Rehab Dept. Number: [place] and [place]

## 2017-11-02 DIAGNOSIS — E119 Type 2 diabetes mellitus without complications: Secondary | ICD-10-CM | POA: Diagnosis not present

## 2017-11-02 DIAGNOSIS — I25810 Atherosclerosis of coronary artery bypass graft(s) without angina pectoris: Secondary | ICD-10-CM | POA: Diagnosis not present

## 2017-11-02 DIAGNOSIS — G9340 Encephalopathy, unspecified: Secondary | ICD-10-CM | POA: Diagnosis not present

## 2017-11-02 DIAGNOSIS — I10 Essential (primary) hypertension: Secondary | ICD-10-CM | POA: Diagnosis not present

## 2017-11-02 LAB — CBC
HCT: 33.6 % — ABNORMAL LOW (ref 36.0–46.0)
Hemoglobin: 10.9 g/dL — ABNORMAL LOW (ref 12.0–15.0)
MCH: 29.9 pg (ref 26.0–34.0)
MCHC: 32.4 g/dL (ref 30.0–36.0)
MCV: 92.1 fL (ref 78.0–100.0)
PLATELETS: 189 10*3/uL (ref 150–400)
RBC: 3.65 MIL/uL — ABNORMAL LOW (ref 3.87–5.11)
RDW: 12.8 % (ref 11.5–15.5)
WBC: 8.2 10*3/uL (ref 4.0–10.5)

## 2017-11-02 LAB — BASIC METABOLIC PANEL
ANION GAP: 7 (ref 5–15)
BUN: 21 mg/dL (ref 8–23)
CALCIUM: 8.7 mg/dL — AB (ref 8.9–10.3)
CO2: 23 mmol/L (ref 22–32)
CREATININE: 1.9 mg/dL — AB (ref 0.44–1.00)
Chloride: 109 mmol/L (ref 98–111)
GFR, EST AFRICAN AMERICAN: 27 mL/min — AB (ref >60)
GFR, EST NON AFRICAN AMERICAN: 23 mL/min — AB (ref >60)
GLUCOSE: 96 mg/dL (ref 70–99)
Potassium: 3.6 mmol/L (ref 3.5–5.1)
Sodium: 139 mmol/L (ref 135–145)

## 2017-11-02 LAB — GLUCOSE, CAPILLARY
GLUCOSE-CAPILLARY: 186 mg/dL — AB (ref 70–99)
GLUCOSE-CAPILLARY: 198 mg/dL — AB (ref 70–99)
Glucose-Capillary: 96 mg/dL (ref 70–99)
Glucose-Capillary: 149 mg/dL — ABNORMAL HIGH (ref 70–99)
Glucose-Capillary: 204 mg/dL — ABNORMAL HIGH (ref 70–99)

## 2017-11-02 MED ORDER — HYDRALAZINE HCL 25 MG PO TABS
25.0000 mg | ORAL_TABLET | Freq: Three times a day (TID) | ORAL | Status: DC
  Administered 2017-11-02 – 2017-11-05 (×9): 25 mg via ORAL
  Filled 2017-11-02 (×9): qty 1

## 2017-11-02 NOTE — Progress Notes (Signed)
OK to D/C tele and transfer patient to med-surg level of care per MD

## 2017-11-02 NOTE — Clinical Social Work Note (Signed)
Clinical Social Work Assessment  Patient Details  Name: Destiny [name] MRN: [id number] Date of Birth: 08-13-1931  Date of referral:  11/02/17               Reason for consult:  Facility Placement                Permission sought to share information with:  Facility Sport and exercise psychologist, Family Supports Permission granted to share information::  Yes, Verbal Permission Granted  Name::     Vallarie Mare  Agency::  SNF  Relationship::  Daughter-in-law, Son  Sport and exercise psychologist Information:     Housing/Transportation Living arrangements for the past 2 months:  Single Family Home Source of Information:  Patient, Adult Children, Medical Team Patient Interpreter Needed:  None Criminal Activity/Legal Involvement Pertinent to Current Situation/Hospitalization:  No - Comment as needed Significant Relationships:  Adult Children Lives with:  Self Do you feel safe going back to the place where you live?  Yes Need for family participation in patient care:  Yes (Comment)  Care giving concerns:  Patient from home alone but will need short term rehab at discharge.    Social Worker assessment / plan:  CSW alerted by team that patient's family is agreeable to SNF; CSW completed referral and faxed out. CSW touched base with patient's daughter-in-law via email to present facility list for patient's family to review. CSW to follow.  Employment status:  Retired Nurse, adult PT Recommendations:  [place] / Referral to community resources:  [place]  Patient/Family's Response to care:  Patient's son and daughter-in-law agreeable to SNF placement.  Patient/Family's Understanding of and Emotional Response to Diagnosis, Current Treatment, and Prognosis:  Patient's family discussed how they know that she is unable to go home alone at this time and will need additional support. Patient's son and daughter-in-law are out of town on vacation but will  review list and determine preferences for SNF.  Emotional Assessment Appearance:  Appears stated age Attitude/Demeanor/Rapport:  Unable to Assess Affect (typically observed):  Unable to Assess Orientation:  Oriented to Self Alcohol / Substance use:  Not Applicable Psych involvement (Current and /or in the community):  No (Comment)  Discharge Needs  Concerns to be addressed:  Care Coordination Readmission within the last 30 days:  Yes Current discharge risk:  Physical Impairment, Dependent with Mobility, Lives alone, Cognitively Impaired Barriers to Discharge:  Continued Medical Work up, Ship broker, Programmer, applications ([place])   Geralynn Ochs, LCSW 11/02/2017, 12:12 PM

## 2017-11-02 NOTE — Progress Notes (Signed)
Progress Note    Destiny [name]  DGU:[id number] DOB: [birth year]  DOA: 10/31/2017 PCP: Mosie Lukes, MD    Brief Narrative:    Medical records reviewed and are as summarized below:  Destiny [name] is an 84 y.o. female with medical history significant for CAD, type 2 diabetes mellitus, hypertension, history of CVA, paroxysmal atrial fibrillation no longer on anticoagulation due to recurrent falls, now presenting to the emergency department for evaluation of headache with garbled speech and increased confusion.  Patient was seen in her normal state at approximately 13:30 this afternoon, later noted by family to be holding her head in her hands, with increased confusion, and garbled speech.  She was complaining of a headache.  She was brought into the ED for further evaluation, headache and slurred speech had resolved, and family reports that her confusion is nearly back to baseline in the ED.  Patient acknowledges having a headache earlier, described as localized to the top of her head, but reports that that is resolved.    Assessment/Plan:   Principal Problem:   Acute encephalopathy Active Problems:   Essential hypertension   Diabetes mellitus type 2, insulin dependent (HCC)   CAD (coronary artery disease) of artery bypass graft   CKD (chronic kidney disease) stage 4, GFR 15-29 ml/min (HCC)   History of CVA (cerebrovascular accident)   Chronic diastolic heart failure (HCC)   AF (paroxysmal atrial fibrillation) ([place])  Acute encephalopathy -- ? Due to elevated BP vs TIA (known episodes in past) - Presents following an episode of transient confusion and slurred speech, complaining of headache at the time and holding her head in her hands  - Head CT is negative for acute findings, notable for chronic ischemic changes  - Headache and slurred speech resolved - Neurology consult-- replace B12, await thiamine level - EEG w/o seizure like activity  History of CVA  - Presents after  transient neurologic changes that were non-focal - MRI: 1.  No acute intracranial abnormality. 2. Moderate atrophy with chronic small vessel ischemic disease with multiple chronic infarcts as above, stable from previous - Continue statin, ASA    Paroxysmal atrial fibrillation  - In a sinus rhythm on admission  - CHADS-VASc 39 (age x2, CVA x2, gender, CHF, DM, CAD, HTN) - Per chart notes from 09/28/17, she was taken off of anticoagulation due to balance issues, refusal to use walker, and recurrent falls   -HR low- monitor  Hypertension -- protein spilling into urine - BP elevated in ED, possibly d/t pain, less likely from acute CVA - increased norvasc -added hydralazine  Insulin-dependent DM  - A1c was 7.5% last month  - lower dose lantus -SSI -regular diet for now-- change to diabetic diet if blood sugars elevated  CKD stage IV  - trend  Anemia -prob related to chronic disease -replace B12  CAD  - No anginal complaints  - Continue Lipitor, ASA 81, and Imdur    Chronic diastolic CHF  - Preserved EF on echo on last month  - Appears compensated, uses Lasix only prn at home  - Hold Lasix, follow daily wt   -check BNP  Urine cx with >100,000 colonies of coag negative staph-- suspect contaminant   Family Communication/Anticipated D/C date and plan/Code Status   DVT prophylaxis: Lovenox ordered. Code Status: Full Code.  Family Communication: called son 8/19 Disposition Plan: SNF   Medical Consultants:    Neuro     Subjective:   No headaches, no  chest pain   Objective:    Vitals:   11/02/17 0405 11/02/17 0500 11/02/17 0802 11/02/17 1226  BP: (!) 177/59  (!) 193/62 (!) 136/53  Pulse: (!) 49  (!) 51 (!) 52  Resp: 18  18 16   Temp: 98.3 F (36.8 C)  98.5 F (36.9 C) 98 F (36.7 C)  TempSrc: Oral  Oral Oral  SpO2: 94%  98% 100%  Weight:  70.6 kg    Height:       No intake or output data in the 24 hours ending 11/02/17 1500 Filed Weights    10/31/17 1748 11/01/17 0641 11/02/17 0500  Weight: 69.4 kg 71.8 kg 70.6 kg    Exam: In bed, able to reposition on own rrr No wheezing, no increased work of breathing No rashes or lesions  Data Reviewed:   I have personally reviewed following labs and imaging studies:  Labs: Labs show the following:   Basic Metabolic Panel: Recent Labs  Lab 10/28/17 1241 10/31/17 06/29/38 11/01/17 0606 11/02/17 0825  NA 136 138 141 139  K 4.2 3.5 3.5 3.6  CL 100 104 106 109  CO2 28 26 26 23   GLUCOSE 239* 87 99 96  BUN 27* 24* 23 21  CREATININE 1.84* 1.93* 1.68* 1.90*  CALCIUM 9.7 9.2 9.0 8.7*   GFR Estimated Creatinine Clearance: 21.4 mL/min (A) (by C-G formula based on SCr of 1.9 mg/dL (H)). Liver Function Tests: Recent Labs  Lab 10/28/17 1241 10/31/17 2140  AST 15 16  ALT 13 13  ALKPHOS 72 64  BILITOT 0.9 1.3*  PROT 7.4 6.6  ALBUMIN 3.8 3.3*   No results for input(s): LIPASE, AMYLASE in the last 168 hours. Recent Labs  Lab 10/31/17 2140  AMMONIA 17   Coagulation profile Recent Labs  Lab 10/31/17 1827 10/31/17 1940  INR 1.04 1.09    CBC: Recent Labs  Lab 10/28/17 1241 10/31/17 1940 11/02/17 0825  WBC 8.4 8.3 8.2  NEUTROABS 5.3 5.4  --   HGB 12.7 11.9* 10.9*  HCT 38.2 36.6 33.6*  MCV 90.0 91.5 92.1  PLT 209.0 197 189   Cardiac Enzymes: No results for input(s): CKTOTAL, CKMB, CKMBINDEX, TROPONINI in the last 168 hours. BNP (last 3 results) Recent Labs    05/21/17 1430  PROBNP 210.0*   CBG: Recent Labs  Lab 11/01/17 1133 11/01/17 1708 11/01/17 2221 11/02/17 0628 11/02/17 1221  GLUCAP 130* 129* 186* 96 149*   D-Dimer: No results for input(s): DDIMER in the last 72 hours. Hgb A1c: No results for input(s): HGBA1C in the last 72 hours. Lipid Profile: No results for input(s): CHOL, HDL, LDLCALC, TRIG, CHOLHDL, LDLDIRECT in the last 72 hours. Thyroid function studies: Recent Labs    10/31/17 06/29/38  TSH 1.328   Anemia work up: Recent Labs     10/31/17 29-Jun-2138  VITAMINB12 333   Sepsis Labs: Recent Labs  Lab 10/28/17 1241 10/31/17 1817 10/31/17 1940 11/02/17 0825  WBC 8.4  --  8.3 8.2  LATICACIDVEN  --  1.06  --   --     Microbiology Recent Results (from the past 240 hour(s))  Urine Culture     Status: Abnormal   Collection Time: 10/28/17 11:29 AM  Result Value Ref Range Status   MICRO NUMBER: [id number]  Final   SPECIMEN QUALITY: ADEQUATE  Final   Sample Source NOT GIVEN  Final   STATUS: FINAL  Final   ISOLATE 1: Coagulase negative staphylococcus, not S. (A)  Final  Comment: Greater than 100,000 CFU/mL of Coagulase negative staphylococcus, not S. saprophyticus      Susceptibility   Coagulase negative staphylococcus, not s. - URINE CULTURE POSITIVE 1    VANCOMYCIN 1 Sensitive     CIPROFLOXACIN <=0.5 Sensitive     LEVOFLOXACIN <=0.12 Sensitive     GENTAMICIN <=0.5 Sensitive     NITROFURANTOIN <=16 Sensitive     OXACILLIN* NR Resistant      * Oxacillin-resistant staphylococci are resistant toall currently available beta-lactam antimicrobialagents including penicillins, beta lactam/beta-lactamase inhibitor combinations, and cephems withstaphylococcal indications, including Cefazolin.    TETRACYCLINE 2 Sensitive     TRIMETH/SULFA* 160 Resistant      * Oxacillin-resistant staphylococci are resistant toall currently available beta-lactam antimicrobialagents including penicillins, beta lactam/beta-lactamase inhibitor combinations, and cephems withstaphylococcal indications, including Cefazolin.Legend:S = Susceptible  I = IntermediateR = Resistant  NS = Not susceptible* = Not tested  NR = Not reported**NN = See antimicrobic comments    Procedures and diagnostic studies:  Dg Chest 2 View  Result Date: 10/31/2017 CLINICAL DATA:  Altered mental status EXAM: CHEST - 2 VIEW COMPARISON:  October 12, 2017 FINDINGS: No evident edema or consolidation. Heart is mildly enlarged with pulmonary vascularity normal. Patient is status post  coronary artery bypass grafting. There is aortic atherosclerosis. No pneumothorax. No evident bone lesions. IMPRESSION: No edema or consolidation. Heart mildly enlarged. There is aortic atherosclerosis. Aortic Atherosclerosis (ICD10-I70.0). Electronically Signed   By: Lowella Grip III M.D.   On: 10/31/2017 20:45   Mr Brain Wo Contrast  Result Date: 11/01/2017 CLINICAL DATA:  Initial evaluation for acute encephalopathy, headache, garbled speech, increased confusion. EXAM: MRI HEAD WITHOUT CONTRAST TECHNIQUE: Multiplanar, multiecho pulse sequences of the brain and surrounding structures were obtained without intravenous contrast. COMPARISON:  Prior CT from 10/31/2017 as well as previous MRI from 05/22/2017. FINDINGS: Brain: Moderately advanced cerebral atrophy. Patchy and confluent T2/FLAIR hyperintensity within the periventricular and deep white matter, most consistent with chronic small vessel ischemic disease. Multiple remote lacunar infarcts within the left greater than right basal ganglia/corona radiata. Chronic right occipital infarcts. Additional tiny remote left cerebellar infarct. No abnormal foci of restricted diffusion to suggest acute or subacute ischemia. Gray-white matter differentiation maintained. No evidence for acute intracranial hemorrhage. No mass lesion, midline shift or mass effect. Ventricular prominence related to global parenchymal volume loss without hydrocephalus. Cavum et septum pellucidum noted. Incidental note made of a partially empty sella. 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 Major intravascular flow voids otherwise maintained. Skull and upper cervical spine: Craniocervical junction normal. Upper cervical spine within normal limits. Bone marrow signal intensity normal. Scalp soft tissues within normal limits. Sinuses/Orbits: Patient status post ocular lens replacement  bilaterally. Paranasal sinuses are clear. No significant mastoid effusion. Other: None. IMPRESSION: 1. No acute intracranial abnormality. 2. Moderate atrophy with chronic small vessel ischemic disease with multiple chronic infarcts as above, stable from previous. Electronically Signed   By: Jeannine Boga M.D.   On: 11/01/2017 03:47   Ct Head Code Stroke Wo Contrast  Result Date: 10/31/2017 CLINICAL DATA:  Code stroke. Last seen normal at 13 30 hours. Follow up stroke. History of stroke, dementia, hypertension, hyperlipidemia, diabetes. EXAM: CT HEAD WITHOUT CONTRAST TECHNIQUE: Contiguous axial images were obtained from the base of the skull through the vertex without intravenous contrast. COMPARISON:  CT HEAD October 28, 2017 and MRI head May 22, 2017. FINDINGS: BRAIN: No intraparenchymal hemorrhage,  mass effect nor midline shift. Moderate parenchymal brain volume loss, cavum septum pellucidum. Advanced mid brain volume loss. RIGHT mesial temporal occipital lobe encephalomalacia. Adjacent RIGHT parietal lobe encephalomalacia. Old LEFT greater than RIGHT basal ganglia cystic infarct with ex vacuo dilatation subjacent ventricle. Patchy supratentorial white matter hypodensities within normal range for patient's age, though non-specific are most compatible with chronic small vessel ischemic disease. No acute large vascular territory infarcts. No abnormal extra-axial fluid collections. Basal cisterns are patent. VASCULAR: Severe calcific atherosclerosis of the carotid siphons. SKULL: No skull fracture. No significant scalp soft tissue swelling. SINUSES/ORBITS: Trace paranasal sinus mucosal thickening. Mastoid air cells are well aerated.The included ocular globes and orbital contents are non-suspicious. Status post bilateral ocular lens implants. OTHER: Patient is edentulous. ASPECTS [facility] Stroke Program Early CT Score) - Ganglionic level infarction (caudate, lentiform nuclei, internal capsule, insula, M1-M3  cortex): 7 - Supraganglionic infarction (M4-M6 cortex): 3 Total score (0-10 with 10 being normal): 10 IMPRESSION: 1. No acute intracranial process. 2. ASPECTS is 10. 3. Chronic changes including old RIGHT PCA territory, RIGHT posterior border zone posterior and small basal ganglia infarcts. 4. Severe atherosclerosis. 5. Critical Value/emergent results text paged to [place] via AMION secure system on 10/31/2017 at 7:17 pm, including interpreting physician's phone number. Electronically Signed   By: Elon Alas M.D.   On: 10/31/2017 19:19    Medications:   . amLODipine  10 mg Oral Daily  . aspirin EC  81 mg Oral Daily  . atorvastatin  20 mg Oral q1800  . cholecalciferol  1,000 Units Oral Daily  . citalopram  20 mg Oral Daily  . famotidine  20 mg Oral QHS  . heparin  5,000 Units Subcutaneous Q8H  . hydrALAZINE  25 mg Oral Q8H  . insulin aspart  0-5 Units Subcutaneous QHS  . insulin aspart  0-9 Units Subcutaneous TID WC  . insulin glargine  15 Units Subcutaneous QHS  . isosorbide mononitrate  30 mg Oral Daily  . omega-3 acid ethyl esters  1 g Oral Daily  . pantoprazole  40 mg Oral Daily  . sodium chloride flush  3 mL Intravenous Q12H  . sodium chloride flush  3 mL Intravenous Q12H  . thiamine  100 mg Oral Q M,W,F  . vitamin B-12  1,000 mcg Oral Daily   Continuous Infusions: . sodium chloride       LOS: 0 days   Geradine Girt  [facility]   *Please refer to [email or web address], password [email or web address] to get updated schedule on who will round on this patient, as hospitalists switch teams weekly. If 7PM-7AM, please contact night-coverage at [email or web address], password [email or web address] for any overnight needs.  11/02/2017, 3:00 PM

## 2017-11-02 NOTE — NC FL2 (Signed)
[place] LEVEL OF CARE SCREENING TOOL     IDENTIFICATION  Patient Name: Destiny [name] Birthdate: [birth year] Sex: female Admission Date (Current Location): 10/31/2017  [facility] and [place] Number:  Herbalist and Address:  [facility] [place] [place] [address], [place], [place] [address]      Provider Number: [id number]  Attending Physician Name and Address:  Geradine Girt, DO  Relative Name and Phone Number:       Current Level of Care: Hospital Recommended Level of Care: [place] Prior Approval Number:    Date Approved/Denied:   PASRR Number: Pending  Discharge Plan: SNF    Current Diagnoses: Patient Active Problem List   Diagnosis Date Noted  . Acute encephalopathy 10/31/2017  . AF (paroxysmal atrial fibrillation) ([place]) 10/31/2017  . SOB (shortness of breath) 10/13/2017  . Diverticulosis of colon without hemorrhage   . Anticoagulated on Coumadin 06/07/2017  . Lower GI bleed 06/07/2017  . Encounter for therapeutic drug monitoring 05/31/2017  . DVT (deep venous thrombosis) ([place]) 05/22/2017  . Atrial fibrillation with RVR ([place]) 05/22/2017  . Ataxia 05/21/2017  . Shortness of breath 04/28/2017  . Stroke (cerebrum) ([place]) 02/01/2017  . Sinusitis 01/31/2017  . Pedal edema 10/12/2016  . Thiamine deficiency 08/13/2015  . Rib fracture 04/21/2015  . UTI (urinary tract infection) 04/08/2014  . Duodenal ulcer with hemorrhage 12/29/2013  . CKD (chronic kidney disease) stage 4, GFR 15-29 ml/min (HCC) 12/28/2013  . GIB (gastrointestinal bleeding) 12/28/2013  . History of CVA (cerebrovascular accident) 12/28/2013  . Chronic diastolic heart failure ([place]) 12/28/2013  . Overweight (BMI 25.0-29.9) 12/28/2013  . Depression 12/24/2013  . Hypersomnolence 03/12/2013  . Arthritis 12/06/2012  . Unstable angina ([place]) 06/07/2012  . CAD (coronary artery disease) of artery bypass graft   . Hx of CABG 06/05/2012  . Debility  06/05/2012  . Anemia 05/27/2011  . Diabetes mellitus type 2, insulin dependent ([place]) 12/22/2010  . Dementia 12/22/2010  . B12 deficiency 12/22/2010  . Type 2 diabetes mellitus with diabetic neuropathy, unspecified ([place]) 08/28/2008  . MURMUR 07/04/2008  . AMAUROSIS FUGAX 08/12/2007  . Hyperlipidemia 06/22/2007  . Essential hypertension 06/22/2007  . Peripheral vascular disease ([place]) 06/21/2007  . CAROTID BRUIT 06/21/2007    Orientation RESPIRATION BLADDER Height & Weight     Self  Normal Continent Weight: 155 lb 10.3 oz (70.6 kg) Height:  5\' 7"  (170.2 cm)  BEHAVIORAL SYMPTOMS/MOOD NEUROLOGICAL BOWEL NUTRITION STATUS    Convulsions/Seizures Continent Diet(regular)  AMBULATORY STATUS COMMUNICATION OF NEEDS Skin   Limited Assist Verbally Normal                       Personal Care Assistance Level of Assistance  Bathing, Feeding, Dressing Bathing Assistance: Limited assistance Feeding assistance: Limited assistance Dressing Assistance: Limited assistance     Functional Limitations Info  Sight, Hearing, Speech Sight Info: Adequate Hearing Info: Adequate Speech Info: Adequate    SPECIAL CARE FACTORS FREQUENCY  PT (By licensed PT), OT (By licensed OT)     PT Frequency: 5x/wk OT Frequency: 5x/wk            Contractures Contractures Info: Not present    Additional Factors Info  Code Status, Allergies, Insulin Sliding Scale Code Status Info: DNR Allergies Info: NKA   Insulin Sliding Scale Info: 0-9 units 3x/day with meals; 0-5 units daily at bed; Lantus 15 units daily at bed       Current Medications (  11/02/2017):  This is the current hospital active medication list Current Facility-Administered Medications  Medication Dose Route Frequency Provider Last Rate Last Dose  . 0.9 %  sodium chloride infusion  250 mL Intravenous PRN Opyd, Ilene Qua, MD      . acetaminophen (TYLENOL) tablet 650 mg  650 mg Oral Q6H PRN Opyd, Ilene Qua, MD       Or  . acetaminophen  (TYLENOL) suppository 650 mg  650 mg Rectal Q6H PRN Opyd, Ilene Qua, MD      . albuterol (PROVENTIL) (2.5 MG/3ML) 0.083% nebulizer solution 2.5 mg  2.5 mg Nebulization Q6H PRN Opyd, Ilene Qua, MD      . amLODipine (NORVASC) tablet 10 mg  10 mg Oral Daily Vann, Jessica U, DO      . aspirin EC tablet 81 mg  81 mg Oral Daily Opyd, Ilene Qua, MD   81 mg at 11/01/17 1246  . atorvastatin (LIPITOR) tablet 20 mg  20 mg Oral q1800 Opyd, Ilene Qua, MD   20 mg at 11/01/17 1732  . cholecalciferol (VITAMIN D) tablet 1,000 Units  1,000 Units Oral Daily Opyd, Ilene Qua, MD   1,000 Units at 11/01/17 1248  . citalopram (CELEXA) tablet 20 mg  20 mg Oral Daily Opyd, Ilene Qua, MD   20 mg at 11/01/17 1245  . famotidine (PEPCID) tablet 20 mg  20 mg Oral QHS Opyd, Ilene Qua, MD   20 mg at 11/01/17 2212  . heparin injection 5,000 Units  5,000 Units Subcutaneous Q8H Vianne Bulls, MD   5,000 Units at 11/02/17 0515  . insulin aspart (novoLOG) injection 0-5 Units  0-5 Units Subcutaneous QHS Opyd, Timothy S, MD      . insulin aspart (novoLOG) injection 0-9 Units  0-9 Units Subcutaneous TID WC Opyd, Ilene Qua, MD   1 Units at 11/01/17 1734  . insulin glargine (LANTUS) injection 15 Units  15 Units Subcutaneous QHS Vianne Bulls, MD   15 Units at 11/01/17 2225  . ipratropium (ATROVENT) nebulizer solution 0.5 mg  0.5 mg Nebulization Q6H PRN Opyd, Ilene Qua, MD      . isosorbide mononitrate (IMDUR) 24 hr tablet 30 mg  30 mg Oral Daily Opyd, Ilene Qua, MD   30 mg at 11/01/17 1249  . omega-3 acid ethyl esters (LOVAZA) capsule 1 g  1 g Oral Daily Opyd, Ilene Qua, MD   1 g at 11/01/17 1250  . ondansetron (ZOFRAN) tablet 4 mg  4 mg Oral Q6H PRN Opyd, Ilene Qua, MD       Or  . ondansetron (ZOFRAN) injection 4 mg  4 mg Intravenous Q6H PRN Opyd, Ilene Qua, MD      . pantoprazole (PROTONIX) EC tablet 40 mg  40 mg Oral Daily Opyd, Ilene Qua, MD   40 mg at 11/01/17 1247  . senna-docusate (Senokot-S) tablet 1 tablet  1 tablet Oral QHS  PRN Opyd, Ilene Qua, MD      . sodium chloride flush (NS) 0.9 % injection 3 mL  3 mL Intravenous Q12H Opyd, Ilene Qua, MD   3 mL at 10/31/17 2240  . sodium chloride flush (NS) 0.9 % injection 3 mL  3 mL Intravenous Q12H Opyd, Ilene Qua, MD   3 mL at 11/01/17 2212  . sodium chloride flush (NS) 0.9 % injection 3 mL  3 mL Intravenous PRN Opyd, Ilene Qua, MD      . thiamine (VITAMIN B-1) tablet 100 mg  100 mg Oral Q M,W,F  Vianne Bulls, MD   100 mg at 11/01/17 1248  . vitamin B-12 (CYANOCOBALAMIN) tablet 1,000 mcg  1,000 mcg Oral Daily Eulogio Bear U, DO   1,000 mcg at 11/01/17 1733     Discharge Medications: Please see discharge summary for a list of discharge medications.  Relevant Imaging Results:  Relevant Lab Results:   Additional Information SS#: [id number]  Geralynn Ochs, LCSW

## 2017-11-02 NOTE — Progress Notes (Signed)
CM spoke to the patients son, [name], and his wife Deanne on the phone. CM informed them of the recommendations for SNF rehab. They were in agreement. CM asked about faxing the patient out in the [facility] area and they agreed. Deanne asked for SNF list to be emailed to her, CSW notified and sent her the information.  [name] would like to hear about bed offers later today. CSW updated.

## 2017-11-02 NOTE — Care Management Obs Status (Signed)
[facility] NOTIFICATION   Patient Details  Name: BRITTIANY WIEHE MRN: [id number] Date of Birth: November 24, 1931   Medicare Observation Status Notification Given:  Yes    Pollie Friar, RN 11/02/2017, 10:27 AM

## 2017-11-03 ENCOUNTER — Observation Stay (HOSPITAL_COMMUNITY): Payer: Medicare Other

## 2017-11-03 DIAGNOSIS — Z794 Long term (current) use of insulin: Secondary | ICD-10-CM | POA: Diagnosis not present

## 2017-11-03 DIAGNOSIS — R4789 Other speech disturbances: Secondary | ICD-10-CM | POA: Diagnosis not present

## 2017-11-03 DIAGNOSIS — E119 Type 2 diabetes mellitus without complications: Secondary | ICD-10-CM | POA: Diagnosis not present

## 2017-11-03 DIAGNOSIS — G9340 Encephalopathy, unspecified: Secondary | ICD-10-CM | POA: Diagnosis not present

## 2017-11-03 DIAGNOSIS — R479 Unspecified speech disturbances: Secondary | ICD-10-CM | POA: Diagnosis not present

## 2017-11-03 DIAGNOSIS — I25810 Atherosclerosis of coronary artery bypass graft(s) without angina pectoris: Secondary | ICD-10-CM | POA: Diagnosis not present

## 2017-11-03 LAB — GLUCOSE, CAPILLARY
GLUCOSE-CAPILLARY: 104 mg/dL — AB (ref 70–99)
Glucose-Capillary: 153 mg/dL — ABNORMAL HIGH (ref 70–99)
Glucose-Capillary: 115 mg/dL — ABNORMAL HIGH (ref 70–99)
Glucose-Capillary: 148 mg/dL — ABNORMAL HIGH (ref 70–99)

## 2017-11-03 LAB — VITAMIN B1: Vitamin B1 (Thiamine): 123.1 nmol/L (ref 66.5–200.0)

## 2017-11-03 MED ORDER — VITAMIN B-12 1000 MCG PO TABS
2000.0000 ug | ORAL_TABLET | Freq: Every day | ORAL | Status: DC
  Administered 2017-11-03 – 2017-11-05 (×3): 2000 ug via ORAL
  Filled 2017-11-03 (×3): qty 2

## 2017-11-03 NOTE — Progress Notes (Addendum)
Called to patient's room by home caregiver.  Patient's speech unintelligible.  Can say "Yes", but is inappropriate with smiling and is unable to follow instructions.  This is a definite change from documented assessment at 2000h the previous evening, but not a change from a.m. assessment.  Unknown when change occurred.  During bedside reporting, her speech pattern was deemed to be unremarkable from baseline.  Patient's son contacted and spoke with this nurse.  MD was notified and came to assess patient.  F/U testing to include MRI / MRA.  Made NPO until testing and swallow study can be completed.

## 2017-11-03 NOTE — Progress Notes (Signed)
MRI brain without acute stroke. MRA head reveals significant intracranial atherosclerotic disease.   Assessment:  1. Possible TIA versus behavioral change 2. MRA with significant intracranial atherosclerotic disease.  Recommendations:  --Add Plavix to ASA --Neurology will sign off. Please call if there are additional questions.  Electronically signed: Dr. Kerney Elbe

## 2017-11-03 NOTE — Progress Notes (Signed)
MRI for CVA negative but MRA has some stenosis.  Await neuro recommendations-- ? Behavioral?   Destiny [name]

## 2017-11-03 NOTE — Progress Notes (Signed)
Progress Note    [name]  KGU:[id number] DOB: [birth year]  DOA: 10/31/2017 PCP: Mosie Lukes, MD    Brief Narrative:    Medical records reviewed and are as summarized below:  Destiny [name] is an 84 y.o. female with medical history significant for CAD, type 2 diabetes mellitus, hypertension, history of CVA, paroxysmal atrial fibrillation no longer on anticoagulation due to recurrent falls, now presenting to the emergency department for evaluation of headache with garbled speech and increased confusion.  Patient was seen in her normal state at approximately 13:30 this afternoon, later noted by family to be holding her head in her hands, with increased confusion, and garbled speech.  She was complaining of a headache.  She was brought into the ED for further evaluation, headache and slurred speech had resolved, and family reports that her confusion is nearly back to baseline in the ED.  Patient was awaiting SNF placement on 8/21 when she had another episode of garbled speech and right sided weakness.  MRI pending   Assessment/Plan:   Principal Problem:   Acute encephalopathy Active Problems:   Essential hypertension   Diabetes mellitus type 2, insulin dependent (HCC)   CAD (coronary artery disease) of artery bypass graft   CKD (chronic kidney disease) stage 4, GFR 15-29 ml/min (HCC)   History of CVA (cerebrovascular accident)   Chronic diastolic heart failure (HCC)   AF (paroxysmal atrial fibrillation) ([facility])  Acute encephalopathy -- ? Due to elevated BP vs TIA (known episodes in past) - Presents following an episode of transient confusion and slurred speech, complaining of headache at the time and holding her head in her hands  - Neurology consult-- replace B12, await thiamine level - EEG w/o seizure like activity, MRI on 8/19 negative -patient had re-occurrence on 8/21 AM with garbles speech and right sided weakness----MRI stat ordered, time of neurologic changes not  clear -family does not want invasive measures  History of CVA  - Presents after transient neurologic changes that were non-focal - MRI: 1.  No acute intracranial abnormality. 2. Moderate atrophy with chronic small vessel ischemic disease with multiple chronic infarcts as above, stable from previous - Continue statin, ASA    Paroxysmal atrial fibrillation  - CHADS-VASc 67 (age x2, CVA x2, gender, CHF, DM, CAD, HTN) - Per chart notes from 09/28/17, she was taken off of anticoagulation due to balance issues, refusal to use walker, and recurrent falls   -resume tele (had been removed to d/c to SNF)  Hypertension -- protein spilling into urine - BP elevated in ED, possibly d/t pain, less likely from acute CVA - increased norvasc -added hydralazine  Insulin-dependent DM  - A1c was 7.5% last month  - lower dose lantus -SSI -regular diet for now-- change to diabetic diet if blood sugars elevated  CKD stage IV  - trend  Anemia -prob related to chronic disease -replace B12 Thiamine normal  CAD  - No anginal complaints  - Continue Lipitor, ASA 81, and Imdur    Chronic diastolic CHF  - Preserved EF on echo on last month  - Appears compensated, uses Lasix only prn at home  - Hold Lasix, follow daily wt   -check BNP  Urine cx with >100,000 colonies of coag negative staph-- suspect contaminant   Family Communication/Anticipated D/C date and plan/Code Status   DVT prophylaxis: Lovenox ordered. Code Status: DNR Family Communication: called son 8/21 with neuro changes Disposition Plan: SNF   Medical Consultants:  Neuro     Subjective:   Called by nursing to report garbled voice and right sided weakness   Objective:    Vitals:   11/03/17 0040 11/03/17 0430 11/03/17 0900 11/03/17 1201  BP: (!) 152/51 (!) 158/52 (!) 177/55 (!) 165/89  Pulse: (!) 48 60 62 67  Resp: 18 18 18 19   Temp: 98.1 F (36.7 C) 99.6 F (37.6 C) 98.7 F (37.1 C) 98.9 F (37.2 C)    TempSrc: Oral Oral Oral Oral  SpO2: 96% 98% 99% 98%  Weight:      Height:        Intake/Output Summary (Last 24 hours) at 11/03/2017 1508 Last data filed at 11/03/2017 0900 Gross per 24 hour  Intake 440 ml  Output -  Net 440 ml   Filed Weights   10/31/17 1748 11/01/17 0641 11/02/17 0500  Weight: 69.4 kg 71.8 kg 70.6 kg    Exam: In bed, speaking but words nonsensical Not moving right arm but all other extremities normal    Data Reviewed:   I have personally reviewed following labs and imaging studies:  Labs: Labs show the following:   Basic Metabolic Panel: Recent Labs  Lab 10/28/17 1241 10/31/17 07/04/38 11/01/17 0606 11/02/17 0825  NA 136 138 141 139  K 4.2 3.5 3.5 3.6  CL 100 104 106 109  CO2 28 26 26 23   GLUCOSE 239* 87 99 96  BUN 27* 24* 23 21  CREATININE 1.84* 1.93* 1.68* 1.90*  CALCIUM 9.7 9.2 9.0 8.7*   GFR Estimated Creatinine Clearance: 21.4 mL/min (A) (by C-G formula based on SCr of 1.9 mg/dL (H)). Liver Function Tests: Recent Labs  Lab 10/28/17 1241 10/31/17 2140  AST 15 16  ALT 13 13  ALKPHOS 72 64  BILITOT 0.9 1.3*  PROT 7.4 6.6  ALBUMIN 3.8 3.3*   No results for input(s): LIPASE, AMYLASE in the last 168 hours. Recent Labs  Lab 10/31/17 2140  AMMONIA 17   Coagulation profile Recent Labs  Lab 10/31/17 1827 10/31/17 1940  INR 1.04 1.09    CBC: Recent Labs  Lab 10/28/17 1241 10/31/17 1940 11/02/17 0825  WBC 8.4 8.3 8.2  NEUTROABS 5.3 5.4  --   HGB 12.7 11.9* 10.9*  HCT 38.2 36.6 33.6*  MCV 90.0 91.5 92.1  PLT 209.0 197 189   Cardiac Enzymes: No results for input(s): CKTOTAL, CKMB, CKMBINDEX, TROPONINI in the last 168 hours. BNP (last 3 results) Recent Labs    05/21/17 1430  PROBNP 210.0*   CBG: Recent Labs  Lab 11/02/17 1221 11/02/17 1647 11/02/17 07-04-99 11/03/17 0642 11/03/17 1159  GLUCAP 149* 204* 198* 104* 153*   D-Dimer: No results for input(s): DDIMER in the last 72 hours. Hgb A1c: No results for  input(s): HGBA1C in the last 72 hours. Lipid Profile: No results for input(s): CHOL, HDL, LDLCALC, TRIG, CHOLHDL, LDLDIRECT in the last 72 hours. Thyroid function studies: Recent Labs    10/31/17 July 04, 2138  TSH 1.328   Anemia work up: Recent Labs    10/31/17 2138-07-04  VITAMINB12 333   Sepsis Labs: Recent Labs  Lab 10/28/17 1241 10/31/17 1817 10/31/17 1940 11/02/17 0825  WBC 8.4  --  8.3 8.2  LATICACIDVEN  --  1.06  --   --     Microbiology Recent Results (from the past 240 hour(s))  Urine Culture     Status: Abnormal   Collection Time: 10/28/17 11:29 AM  Result Value Ref Range Status   MICRO NUMBER: [id number]  Final   SPECIMEN QUALITY: ADEQUATE  Final   Sample Source NOT GIVEN  Final   STATUS: FINAL  Final   ISOLATE 1: Coagulase negative staphylococcus, not S. (A)  Final    Comment: Greater than 100,000 CFU/mL of Coagulase negative staphylococcus, not S. saprophyticus      Susceptibility   Coagulase negative staphylococcus, not s. - URINE CULTURE POSITIVE 1    VANCOMYCIN 1 Sensitive     CIPROFLOXACIN <=0.5 Sensitive     LEVOFLOXACIN <=0.12 Sensitive     GENTAMICIN <=0.5 Sensitive     NITROFURANTOIN <=16 Sensitive     OXACILLIN* NR Resistant      * Oxacillin-resistant staphylococci are resistant toall currently available beta-lactam antimicrobialagents including penicillins, beta lactam/beta-lactamase inhibitor combinations, and cephems withstaphylococcal indications, including Cefazolin.    TETRACYCLINE 2 Sensitive     TRIMETH/SULFA* 160 Resistant      * Oxacillin-resistant staphylococci are resistant toall currently available beta-lactam antimicrobialagents including penicillins, beta lactam/beta-lactamase inhibitor combinations, and cephems withstaphylococcal indications, including Cefazolin.Legend:S = Susceptible  I = IntermediateR = Resistant  NS = Not susceptible* = Not tested  NR = Not reported**NN = See antimicrobic comments  Culture, blood (Routine X 2) w Reflex to ID  Panel     Status: None (Preliminary result)   Collection Time: 11/01/17  5:02 PM  Result Value Ref Range Status   Specimen Description BLOOD ARM  Final   Special Requests   Final    BOTTLES DRAWN AEROBIC AND ANAEROBIC Blood Culture adequate volume   Culture   Final    NO GROWTH 2 DAYS Performed at [facility], [place] [address], [place], [place] [address]    Report Status PENDING  Incomplete  Culture, blood (single) w Reflex to ID Panel     Status: None (Preliminary result)   Collection Time: 11/01/17  5:07 PM  Result Value Ref Range Status   Specimen Description BLOOD LEFT ANTECUBITAL  Final   Special Requests   Final    BOTTLES DRAWN AEROBIC AND ANAEROBIC Blood Culture adequate volume   Culture   Final    NO GROWTH 2 DAYS Performed at [facility], [place] [address] [place], [place] [address]    Report Status PENDING  Incomplete    Procedures and diagnostic studies:  No results found.  Medications:   . amLODipine  10 mg Oral Daily  . aspirin EC  81 mg Oral Daily  . atorvastatin  20 mg Oral q1800  . cholecalciferol  1,000 Units Oral Daily  . citalopram  20 mg Oral Daily  . famotidine  20 mg Oral QHS  . heparin  5,000 Units Subcutaneous Q8H  . hydrALAZINE  25 mg Oral Q8H  . insulin aspart  0-5 Units Subcutaneous QHS  . insulin aspart  0-9 Units Subcutaneous TID WC  . insulin glargine  15 Units Subcutaneous QHS  . isosorbide mononitrate  30 mg Oral Daily  . omega-3 acid ethyl esters  1 g Oral Daily  . pantoprazole  40 mg Oral Daily  . sodium chloride flush  3 mL Intravenous Q12H  . sodium chloride flush  3 mL Intravenous Q12H  . thiamine  100 mg Oral Q M,W,F  . [START ON 11/04/2017] vitamin B-12  2,000 mcg Oral Daily   Continuous Infusions: . sodium chloride       LOS: 0 days   Geradine Girt  [facility]   *Please refer to [email or web address], password [email or web address] to get updated schedule  on who will round on this patient, as hospitalists switch teams  weekly. If 7PM-7AM, please contact night-coverage at [email or web address], password [email or web address] for any overnight needs.  11/03/2017, 3:08 PM

## 2017-11-03 NOTE — Progress Notes (Signed)
CSW following for discharge plan. CSW received information from patient's daughter-in-law about facility preferences. Patient's family would prefer [facility], [facility], or [facility]. CSW reached out to facilities: [facility] is unable to accept as they do not contract with [facility] Medicare; [facility] has no availability; [facility] is able to take the patient. [facility] has initiated insurance authorization request.  CSW touched base with son, Eulas Post, at the bedside to update on facility options. Eulas Post said he was frustrated about the day's happenings but appreciated the update on discharge plans.   CSW to follow.  Laveda Abbe, [place] Clinical Social Worker [phone]

## 2017-11-04 DIAGNOSIS — G9340 Encephalopathy, unspecified: Secondary | ICD-10-CM | POA: Diagnosis not present

## 2017-11-04 LAB — GLUCOSE, CAPILLARY
GLUCOSE-CAPILLARY: 105 mg/dL — AB (ref 70–99)
GLUCOSE-CAPILLARY: 149 mg/dL — AB (ref 70–99)
GLUCOSE-CAPILLARY: 184 mg/dL — AB (ref 70–99)
GLUCOSE-CAPILLARY: 198 mg/dL — AB (ref 70–99)
GLUCOSE-CAPILLARY: 97 mg/dL (ref 70–99)

## 2017-11-04 MED ORDER — AMLODIPINE BESYLATE 10 MG PO TABS: 10.0000 mg | ORAL_TABLET | Freq: Every day | ORAL | Status: DC

## 2017-11-04 MED ORDER — INSULIN GLARGINE 100 UNIT/ML ~~LOC~~ SOLN: 15.0000 [IU] | Freq: Every day | SUBCUTANEOUS | 1 refills | Status: AC

## 2017-11-04 MED ORDER — HYDRALAZINE HCL 25 MG PO TABS: 25.0000 mg | ORAL_TABLET | Freq: Three times a day (TID) | ORAL | Status: DC

## 2017-11-04 NOTE — Discharge Summary (Signed)
Physician Discharge Summary  Destiny [name] PXT:[id number] DOB: 25-May-1931 DOA: 10/31/2017  PCP: Mosie Lukes, MD  Admit date: 10/31/2017 Discharge date: 11/04/2017  Time spent: 25 minutes  Recommendations for Outpatient Follow-up:  1. Please consult palliative medicine as an outpatient for goals of care 2. Added Plavix to medications this admission-also note dosage change of Lantus from 35 to 15 units creased amlodipine from 5-10 daily and added hydralazine 25 3 times daily for blood pressure control 3. With history of prior GI bleed should be on both Zantac and Protonix   Discharge Diagnoses:  Principal Problem:   Acute encephalopathy Active Problems:   Essential hypertension   Diabetes mellitus type 2, insulin dependent (HCC)   CAD (coronary artery disease) of artery bypass graft   CKD (chronic kidney disease) stage 4, GFR 15-29 ml/min (HCC)   History of CVA (cerebrovascular accident)   Chronic diastolic heart failure (HCC)   AF (paroxysmal atrial fibrillation) ([place])   Discharge Condition: Guarded  Diet recommendation: Heart healthy  Filed Weights   10/31/17 1748 11/01/17 0641 11/02/17 0500  Weight: 69.4 kg 71.8 kg 70.6 kg    History of present illness:  84 year old female who started to have issues after a stroke in 01/2017-follows with neurologist Dr. Leonie Man she also has multiple comorbid such as permanent A. fib with a chads score of 9 but has risks of GI bleed and falls-therefore she is not on anticoagulation Admitted on 6/27 with metabolic encephalopathy-had garbled speech and increasing confusion  On admission hypertensive 200s INR 1.0 troponin negative CT head negative CXR negative neurology consulted  Hospital Course:  Metabolic encephalopathy-likely secondary to her recurrent TIAs full work-up MRI EEG ammonia TSH RPR B12 negative Stuttering pattern to confusion was confused on 8/21 Spoke directly with sitter-at baseline she talks walks and interacts  appropriately-neurology saw the patient again on 8/21 and recommended addition of Plavix to her medications to prevent strokes I had a long discussion with family and explained that the risk benefit ratio for bleed is high and would not necessarily anticoagulate her  Prior DVT 1 year ago-completed short course of the same but then had bleeding apparently Permanent A. fib chads score 9-rate controlled during hospital stay Diabetes mellitus type 2-cut back dosing of insulin as above CKD 4    Consultations:  Neurology  Discharge Exam: Vitals:   11/04/17 0400 11/04/17 0731  BP: (!) 165/68 (!) 152/63  Pulse: 66 67  Resp: 16 18  Temp: 98.6 F (37 C) 98.7 F (37.1 C)  SpO2: 97% 97%    General: Awake alert cannot orient hungry today and seems much improved from prior yesterday apparently she was having garbled speech and was unable to follow commands worse today she is-I did not see her yesterday Cardiovascular: S1-S2 irregular but rate controlled in the 90s Respiratory: Clinically clear no added sound Abdomen soft nontender Finger-nose-finger intact bilaterally Power 5/5 lower extremities Smile is symmetric Vision not tested   Discharge Instructions    Allergies as of 11/04/2017   No Known Allergies     Medication List    STOP taking these medications   blood glucose meter kit and supplies Kit   cefdinir 300 MG capsule Commonly known as:  OMNICEF   ferrous sulfate 325 (65 FE) MG tablet   furosemide 20 MG tablet Commonly known as:  LASIX   Vitamin D 1000 units capsule     TAKE these medications   albuterol 108 (90 Base) MCG/ACT inhaler Commonly known as:  PROVENTIL HFA;VENTOLIN HFA Inhale 2 puffs into the lungs every 6 (six) hours as needed for wheezing or shortness of breath.   amLODipine 10 MG tablet Commonly known as:  NORVASC Take 1 tablet (10 mg total) by mouth daily. Start taking on:  11/05/2017 What changed:    medication strength  how much to  take   aspirin EC 81 MG tablet Take 1 tablet (81 mg total) by mouth daily.   atorvastatin 20 MG tablet Commonly known as:  LIPITOR Take 1 tablet (20 mg total) by mouth daily.   citalopram 20 MG tablet Commonly known as:  CELEXA Take 1 tablet (20 mg total) by mouth daily.   Fish Oil 1200 MG Caps Take 1,200 mg by mouth daily.   hydrALAZINE 25 MG tablet Commonly known as:  APRESOLINE Take 1 tablet (25 mg total) by mouth every 8 (eight) hours.   insulin glargine 100 UNIT/ML injection Commonly known as:  LANTUS Inject 0.15 mLs (15 Units total) into the skin daily. What changed:    how much to take  how to take this  when to take this  additional instructions   insulin lispro 100 UNIT/ML KiwkPen Commonly known as:  HUMALOG Inject 0.05-0.1 mLs (5-10 Units total) into the skin 3 (three) times daily with meals.   ipratropium 17 MCG/ACT inhaler Commonly known as:  ATROVENT HFA Inhale 2 puffs into the lungs every 6 (six) hours as needed for wheezing.   isosorbide mononitrate 30 MG 24 hr tablet Commonly known as:  IMDUR Take 1 tablet (30 mg total) by mouth at bedtime. What changed:  when to take this   nitroGLYCERIN 0.4 MG SL tablet Commonly known as:  NITROSTAT Place 0.4 mg under the tongue every 5 (five) minutes as needed for chest pain.   pantoprazole 40 MG tablet Commonly known as:  PROTONIX Take 1 tablet (40 mg total) by mouth daily.   ranitidine 300 MG tablet Commonly known as:  ZANTAC Take 150 mg by mouth at bedtime.   thiamine 100 MG tablet Commonly known as:  VITAMIN B-1 Take 1 tablet (100 mg total) by mouth daily.      No Known Allergies Contact information for after-discharge care    Destination    [facility] Preferred SNF .   Service:  Skilled Nursing Contact information: [address] [place] [phone]               The results of significant diagnostics from this hospitalization (including  imaging, microbiology, ancillary and laboratory) are listed below for reference.    Significant Diagnostic Studies: Dg Chest 2 View  Result Date: 10/31/2017 CLINICAL DATA:  Altered mental status EXAM: CHEST - 2 VIEW COMPARISON:  October 12, 2017 FINDINGS: No evident edema or consolidation. Heart is mildly enlarged with pulmonary vascularity normal. Patient is status post coronary artery bypass grafting. There is aortic atherosclerosis. No pneumothorax. No evident bone lesions. IMPRESSION: No edema or consolidation. Heart mildly enlarged. There is aortic atherosclerosis. Aortic Atherosclerosis (ICD10-I70.0). Electronically Signed   By: Lowella Grip III M.D.   On: 10/31/2017 20:45   Dg Chest 2 View  Result Date: 10/12/2017 CLINICAL DATA:  Shortness of breath and wheezing since last night, former smoker, chronic kidney disease, diabetes mellitus, stroke, hypertension, former smoker EXAM: CHEST - 2 VIEW COMPARISON:  05/21/2017 FINDINGS: Enlargement of cardiac silhouette post CABG. Calcified tortuous thoracic aorta. Pulmonary vascularity normal. Peribronchial thickening with probable small bibasilar pleural effusions and mild LEFT basilar atelectasis.  Underlying emphysematous changes. Upper lungs clear. No pneumothorax or acute osseous findings. Bones appear demineralized. IMPRESSION: Enlargement of cardiac silhouette post CABG. COPD changes with probable small bibasilar pleural effusions and mild LEFT basilar atelectasis. Electronically Signed   By: Lavonia Dana M.D.   On: 10/12/2017 11:24   Ct Head Wo Contrast  Result Date: 10/28/2017 CLINICAL DATA:  84 year old female with syncope, fall. Confusion, slurred speech. EXAM: CT HEAD WITHOUT CONTRAST TECHNIQUE: Contiguous axial images were obtained from the base of the skull through the vertex without intravenous contrast. COMPARISON:  Brain MRI 05/22/2017. Head CT without contrast 05/21/2017 and earlier. FINDINGS: Brain: Chronic infarcts in the left corona  radiata, left basal ganglia, right PCA territory. Chronic lacunar infarcts in the right basal ganglia. Stable gray-white matter differentiation throughout the brain. Stable cerebral volume. No midline shift, ventriculomegaly, mass effect, evidence of mass lesion, intracranial hemorrhage or evidence of cortically based acute infarction. Vascular: Extensive Calcified atherosclerosis at the skull base. No suspicious intracranial vascular hyperdensity. Skull: Stable and intact. Sinuses/Orbits: Visualized paranasal sinuses and mastoids are stable and well pneumatized. Other: No scalp hematoma identified. No acute orbit or scalp soft tissue findings. IMPRESSION: 1. No acute intracranial abnormality. No acute traumatic injury identified. 2. Chronic ischemic disease appears stable since 05/21/2017. Electronically Signed   By: Genevie Ann M.D.   On: 10/28/2017 17:03   Mr Jodene Nam Head Wo Contrast  Result Date: 11/03/2017 CLINICAL DATA:  Speech difficulty EXAM: MRI HEAD WITHOUT CONTRAST MRA HEAD WITHOUT CONTRAST MRA NECK WITHOUT CONTRAST TECHNIQUE: Multiplanar, multiecho pulse sequences of the brain and surrounding structures were obtained without intravenous contrast. Angiographic images of the Circle of Willis were obtained using MRA technique without intravenous contrast. Angiographic images of the neck were obtained using MRA technique without intravenous contrast. Carotid stenosis measurements (when applicable) are obtained utilizing NASCET criteria, using the distal internal carotid diameter as the denominator. COMPARISON:  Brain MRI from 2 days ago FINDINGS: MRI HEAD FINDINGS Brain: No acute infarction, hemorrhage, hydrocephalus, extra-axial collection or mass lesion. Moderate remote right occipital cortex infarct. Small remote right inferior temporal occipital infarct. Remote lateral lenticulostriate striate infarct on the left affecting white matter and basal ganglia. There is hazy FLAIR hyperintensity in the left more  than right cerebral white matter. Generalized atrophy. Vascular: Major flow voids are preserved. Skull and upper cervical spine: No evidence of marrow lesion Sinuses/Orbits: Bilateral cataract resection MRA HEAD FINDINGS Symmetric carotid arteries in the upper neck. There is diffuse narrowing of the bilateral cavernous carotids with particularly severe stenosis on the left. Extensive atherosclerotic irregularity of bilateral MCAs. No meaningful flow seen within the presumably non dominant right vertebral artery. There is severe atherosclerotic irregularity of the left V4 and proximal basilar. The proximal basilar shows a fenestration. Branch occlusion or flow reducing/severe stenosis at the proximal right P2 segment. MRA NECK FINDINGS Nondiagnostic due to motion IMPRESSION: Brain MRI: 1. No acute finding. 2. Advanced atrophy and chronic small vessel ischemic injury as described. 3. Motion degraded. Intracranial MRA: 1. Severe atherosclerotic irregularity. 2. Severe left cavernous ICA narrowing. 3. No meaningful flow in the right vertebral artery which may be non dominant. Moderate narrowing at the vertebrobasilar junction. 4. Severe stenosis versus occlusion at the proximal right P2 segment. Neck MRA: Nondiagnostic due to motion Electronically Signed   By: Monte Fantasia M.D.   On: 11/03/2017 15:36   Mr Jodene Nam Neck Wo Contrast  Result Date: 11/03/2017 CLINICAL DATA:  Speech difficulty EXAM: MRI HEAD WITHOUT CONTRAST MRA HEAD WITHOUT  CONTRAST MRA NECK WITHOUT CONTRAST TECHNIQUE: Multiplanar, multiecho pulse sequences of the brain and surrounding structures were obtained without intravenous contrast. Angiographic images of the Circle of Willis were obtained using MRA technique without intravenous contrast. Angiographic images of the neck were obtained using MRA technique without intravenous contrast. Carotid stenosis measurements (when applicable) are obtained utilizing NASCET criteria, using the distal internal  carotid diameter as the denominator. COMPARISON:  Brain MRI from 2 days ago FINDINGS: MRI HEAD FINDINGS Brain: No acute infarction, hemorrhage, hydrocephalus, extra-axial collection or mass lesion. Moderate remote right occipital cortex infarct. Small remote right inferior temporal occipital infarct. Remote lateral lenticulostriate striate infarct on the left affecting white matter and basal ganglia. There is hazy FLAIR hyperintensity in the left more than right cerebral white matter. Generalized atrophy. Vascular: Major flow voids are preserved. Skull and upper cervical spine: No evidence of marrow lesion Sinuses/Orbits: Bilateral cataract resection MRA HEAD FINDINGS Symmetric carotid arteries in the upper neck. There is diffuse narrowing of the bilateral cavernous carotids with particularly severe stenosis on the left. Extensive atherosclerotic irregularity of bilateral MCAs. No meaningful flow seen within the presumably non dominant right vertebral artery. There is severe atherosclerotic irregularity of the left V4 and proximal basilar. The proximal basilar shows a fenestration. Branch occlusion or flow reducing/severe stenosis at the proximal right P2 segment. MRA NECK FINDINGS Nondiagnostic due to motion IMPRESSION: Brain MRI: 1. No acute finding. 2. Advanced atrophy and chronic small vessel ischemic injury as described. 3. Motion degraded. Intracranial MRA: 1. Severe atherosclerotic irregularity. 2. Severe left cavernous ICA narrowing. 3. No meaningful flow in the right vertebral artery which may be non dominant. Moderate narrowing at the vertebrobasilar junction. 4. Severe stenosis versus occlusion at the proximal right P2 segment. Neck MRA: Nondiagnostic due to motion Electronically Signed   By: Monte Fantasia M.D.   On: 11/03/2017 15:36   Mr Brain Wo Contrast  Result Date: 11/03/2017 CLINICAL DATA:  Speech difficulty EXAM: MRI HEAD WITHOUT CONTRAST MRA HEAD WITHOUT CONTRAST MRA NECK WITHOUT CONTRAST  TECHNIQUE: Multiplanar, multiecho pulse sequences of the brain and surrounding structures were obtained without intravenous contrast. Angiographic images of the Circle of Willis were obtained using MRA technique without intravenous contrast. Angiographic images of the neck were obtained using MRA technique without intravenous contrast. Carotid stenosis measurements (when applicable) are obtained utilizing NASCET criteria, using the distal internal carotid diameter as the denominator. COMPARISON:  Brain MRI from 2 days ago FINDINGS: MRI HEAD FINDINGS Brain: No acute infarction, hemorrhage, hydrocephalus, extra-axial collection or mass lesion. Moderate remote right occipital cortex infarct. Small remote right inferior temporal occipital infarct. Remote lateral lenticulostriate striate infarct on the left affecting white matter and basal ganglia. There is hazy FLAIR hyperintensity in the left more than right cerebral white matter. Generalized atrophy. Vascular: Major flow voids are preserved. Skull and upper cervical spine: No evidence of marrow lesion Sinuses/Orbits: Bilateral cataract resection MRA HEAD FINDINGS Symmetric carotid arteries in the upper neck. There is diffuse narrowing of the bilateral cavernous carotids with particularly severe stenosis on the left. Extensive atherosclerotic irregularity of bilateral MCAs. No meaningful flow seen within the presumably non dominant right vertebral artery. There is severe atherosclerotic irregularity of the left V4 and proximal basilar. The proximal basilar shows a fenestration. Branch occlusion or flow reducing/severe stenosis at the proximal right P2 segment. MRA NECK FINDINGS Nondiagnostic due to motion IMPRESSION: Brain MRI: 1. No acute finding. 2. Advanced atrophy and chronic small vessel ischemic injury as described. 3. Motion degraded.  Intracranial MRA: 1. Severe atherosclerotic irregularity. 2. Severe left cavernous ICA narrowing. 3. No meaningful flow in the  right vertebral artery which may be non dominant. Moderate narrowing at the vertebrobasilar junction. 4. Severe stenosis versus occlusion at the proximal right P2 segment. Neck MRA: Nondiagnostic due to motion Electronically Signed   By: Monte Fantasia M.D.   On: 11/03/2017 15:36   Mr Brain Wo Contrast  Result Date: 11/01/2017 CLINICAL DATA:  Initial evaluation for acute encephalopathy, headache, garbled speech, increased confusion. EXAM: MRI HEAD WITHOUT CONTRAST TECHNIQUE: Multiplanar, multiecho pulse sequences of the brain and surrounding structures were obtained without intravenous contrast. COMPARISON:  Prior CT from 10/31/2017 as well as previous MRI from 05/22/2017. FINDINGS: Brain: Moderately advanced cerebral atrophy. Patchy and confluent T2/FLAIR hyperintensity within the periventricular and deep white matter, most consistent with chronic small vessel ischemic disease. Multiple remote lacunar infarcts within the left greater than right basal ganglia/corona radiata. Chronic right occipital infarcts. Additional tiny remote left cerebellar infarct. No abnormal foci of restricted diffusion to suggest acute or subacute ischemia. Gray-white matter differentiation maintained. No evidence for acute intracranial hemorrhage. No mass lesion, midline shift or mass effect. Ventricular prominence related to global parenchymal volume loss without hydrocephalus. Cavum et septum pellucidum noted. Incidental note made of a partially empty sella. 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 Major intravascular flow voids otherwise maintained. Skull and upper cervical spine: Craniocervical junction normal. Upper cervical spine within normal limits. Bone marrow signal intensity normal. Scalp soft tissues within normal limits. Sinuses/Orbits: Patient status post ocular lens replacement bilaterally. Paranasal sinuses are clear. No  significant mastoid effusion. Other: None. IMPRESSION: 1. No acute intracranial abnormality. 2. Moderate atrophy with chronic small vessel ischemic disease with multiple chronic infarcts as above, stable from previous. Electronically Signed   By: Jeannine Boga M.D.   On: 11/01/2017 03:47   Ct Head Code Stroke Wo Contrast  Result Date: 10/31/2017 CLINICAL DATA:  Code stroke. Last seen normal at 13 30 hours. Follow up stroke. History of stroke, dementia, hypertension, hyperlipidemia, diabetes. EXAM: CT HEAD WITHOUT CONTRAST TECHNIQUE: Contiguous axial images were obtained from the base of the skull through the vertex without intravenous contrast. COMPARISON:  CT HEAD October 28, 2017 and MRI head May 22, 2017. FINDINGS: BRAIN: No intraparenchymal hemorrhage, mass effect nor midline shift. Moderate parenchymal brain volume loss, cavum septum pellucidum. Advanced mid brain volume loss. RIGHT mesial temporal occipital lobe encephalomalacia. Adjacent RIGHT parietal lobe encephalomalacia. Old LEFT greater than RIGHT basal ganglia cystic infarct with ex vacuo dilatation subjacent ventricle. Patchy supratentorial white matter hypodensities within normal range for patient's age, though non-specific are most compatible with chronic small vessel ischemic disease. No acute large vascular territory infarcts. No abnormal extra-axial fluid collections. Basal cisterns are patent. VASCULAR: Severe calcific atherosclerosis of the carotid siphons. SKULL: No skull fracture. No significant scalp soft tissue swelling. SINUSES/ORBITS: Trace paranasal sinus mucosal thickening. Mastoid air cells are well aerated.The included ocular globes and orbital contents are non-suspicious. Status post bilateral ocular lens implants. OTHER: Patient is edentulous. ASPECTS [facility] Stroke Program Early CT Score) - Ganglionic level infarction (caudate, lentiform nuclei, internal capsule, insula, M1-M3 cortex): 7 - Supraganglionic infarction  (M4-M6 cortex): 3 Total score (0-10 with 10 being normal): 10 IMPRESSION: 1. No acute intracranial process. 2. ASPECTS is 10. 3. Chronic changes including old RIGHT PCA territory, RIGHT posterior border zone posterior and small basal ganglia infarcts. 4. Severe  atherosclerosis. 5. Critical Value/emergent results text paged to [facility] via [employer] secure system on 10/31/2017 at 7:17 pm, including interpreting physician's phone number. Electronically Signed   By: Elon Alas M.D.   On: 10/31/2017 19:19    Microbiology: Recent Results (from the past 240 hour(s))  Urine Culture     Status: Abnormal   Collection Time: 10/28/17 11:29 AM  Result Value Ref Range Status   MICRO NUMBER: [id number]  Final   SPECIMEN QUALITY: ADEQUATE  Final   Sample Source NOT GIVEN  Final   STATUS: FINAL  Final   ISOLATE 1: Coagulase negative staphylococcus, not S. (A)  Final    Comment: Greater than 100,000 CFU/mL of Coagulase negative staphylococcus, not S. saprophyticus      Susceptibility   Coagulase negative staphylococcus, not s. - URINE CULTURE POSITIVE 1    VANCOMYCIN 1 Sensitive     CIPROFLOXACIN <=0.5 Sensitive     LEVOFLOXACIN <=0.12 Sensitive     GENTAMICIN <=0.5 Sensitive     NITROFURANTOIN <=16 Sensitive     OXACILLIN* NR Resistant      * Oxacillin-resistant staphylococci are resistant toall currently available beta-lactam antimicrobialagents including penicillins, beta lactam/beta-lactamase inhibitor combinations, and cephems withstaphylococcal indications, including Cefazolin.    TETRACYCLINE 2 Sensitive     TRIMETH/SULFA* 160 Resistant      * Oxacillin-resistant staphylococci are resistant toall currently available beta-lactam antimicrobialagents including penicillins, beta lactam/beta-lactamase inhibitor combinations, and cephems withstaphylococcal indications, including Cefazolin.Legend:S = Susceptible  I = IntermediateR = Resistant  NS = Not susceptible* = Not tested  NR = Not reported**NN  = See antimicrobic comments  Culture, blood (Routine X 2) w Reflex to ID Panel     Status: None (Preliminary result)   Collection Time: 11/01/17  5:02 PM  Result Value Ref Range Status   Specimen Description BLOOD ARM  Final   Special Requests   Final    BOTTLES DRAWN AEROBIC AND ANAEROBIC Blood Culture adequate volume   Culture   Final    NO GROWTH 3 DAYS Performed at [facility], [place] [address] [place], [place] [address]    Report Status PENDING  Incomplete  Culture, blood (single) w Reflex to ID Panel     Status: None (Preliminary result)   Collection Time: 11/01/17  5:07 PM  Result Value Ref Range Status   Specimen Description BLOOD LEFT ANTECUBITAL  Final   Special Requests   Final    BOTTLES DRAWN AEROBIC AND ANAEROBIC Blood Culture adequate volume   Culture   Final    NO GROWTH 3 DAYS Performed at [facility], [place] [address], [place], [place] [address]    Report Status PENDING  Incomplete     Labs: Basic Metabolic Panel: Recent Labs  Lab 10/28/17 1241 10/31/17 2140 11/01/17 0606 11/02/17 0825  NA 136 138 141 139  K 4.2 3.5 3.5 3.6  CL 100 104 106 109  CO2 _0 GLUCOSE 239* 87 99 96  BUN 27* 24* 23 21  CREATININE 1.84* 1.93* 1.68* 1.90*  CALCIUM 9.7 9.2 9.0 8.7*   Liver Function Tests: Recent Labs  Lab 10/28/17 1241 10/31/17 2140  AST 15 16  ALT 13 13  ALKPHOS 72 64  BILITOT 0.9 1.3*  PROT 7.4 6.6  ALBUMIN 3.8 3.3*   No results for input(s): LIPASE, AMYLASE in the last 168 hours. Recent Labs  Lab 10/31/17 2140  AMMONIA 17   CBC: Recent Labs  Lab 10/28/17  1241 10/31/17 1940 11/02/17 0825  WBC 8.4 8.3 8.2  NEUTROABS 5.3 5.4  --   HGB 12.7 11.9* 10.9*  HCT 38.2 36.6 33.6*  MCV 90.0 91.5 92.1  PLT 209.0 197 189   Cardiac Enzymes: No results for input(s): CKTOTAL, CKMB, CKMBINDEX, TROPONINI in the last 168 hours. BNP: BNP (last 3 results) Recent Labs    10/12/17 1057 11/01/17 1609  BNP 510.8* 277.5*    ProBNP  (last 3 results) Recent Labs    05/21/17 1430  PROBNP 210.0*    CBG: Recent Labs  Lab 11/03/17 1159 11/03/17 1646 11/03/17 2202 11/04/17 0613 11/04/17 1102  GLUCAP 153* 115* 148* 105* 149*       Signed:  Nita Sells MD   [facility] 11/04/2017, 11:30 AM

## 2017-11-04 NOTE — Progress Notes (Signed)
When RN went into room to assess patient, patient's son upset that patient was being woke up to be assessed.  Son advised that patient is not being allowed to rest.  RN explained to son that RN needs to assess patient every 2 hours.  Son appeared to understand.  RN will continue to monitor patient

## 2017-11-04 NOTE — Progress Notes (Signed)
Physical Therapy Treatment Patient Details Name: Destiny [name] MRN: [id number] DOB: 1931-11-02 Today's Date: 11/04/2017    History of Present Illness 84 y.o. female was admitted for HA, speech changes, and acute encephalopathy with confusion and noted abnormal EEG with no seizures.  PMHx:  CKD, CAD, dementia, DM, HTN, stroke    PT Comments    Patient doing well this morning - caregiver present. Patient today requiring general min A for bed mobility, transfers and limited gait with RW. Patient becoming increasingly fatigued vs lethargic as session progressed with caregiver stating this is her normal at home. BP in supine immediately following activity 132/56 with nursing notified of mobility status. Will continue to follow acutely.     Follow Up Recommendations  SNF     Equipment Recommendations  None recommended by PT    Recommendations for Other Services       Precautions / Restrictions Precautions Precautions: Fall Restrictions Weight Bearing Restrictions: No    Mobility  Bed Mobility Overal bed mobility: Needs Assistance Bed Mobility: Supine to Sit;Sit to Supine     Supine to sit: Min assist Sit to supine: Min assist   General bed mobility comments: Min A for LE management and trunk positioning  Transfers Overall transfer level: Needs assistance Equipment used: Rolling walker (2 wheeled) Transfers: Sit to/from Stand Sit to Stand: Min assist         General transfer comment: Min A to power up at bedside and toilet  Ambulation/Gait Ambulation/Gait assistance: Min guard Gait Distance (Feet): 20 Feet Assistive device: Rolling walker (2 wheeled) Gait Pattern/deviations: Step-to pattern;Step-through pattern;Decreased stride length;Shuffle;Trunk flexed Gait velocity: decreased   General Gait Details: limited gait in room; attempted to progress, however patient becoming lethargic which personal caregivcer reports at baseline - returned to supine due to probable  drop in BP - BP 132/56   Stairs             Wheelchair Mobility    Modified Rankin (Stroke Patients Only)       Balance Overall balance assessment: Needs assistance Sitting-balance support: Bilateral upper extremity supported;Feet supported Sitting balance-Leahy Scale: Fair     Standing balance support: Bilateral upper extremity supported;During functional activity Standing balance-Leahy Scale: Poor                              Cognition Arousal/Alertness: Awake/alert Behavior During Therapy: Flat affect Overall Cognitive Status: History of cognitive impairments - at baseline                                 General Comments: able to state that she in in the hospital, but otherwise laughs with other questions      Exercises      General Comments        Pertinent Vitals/Pain Pain Assessment: No/denies pain    Home Living                      Prior Function            PT Goals (current goals can now be found in the care plan section) Acute Rehab PT Goals Patient Stated Goal: none stated PT Goal Formulation: With patient Time For Goal Achievement: 11/15/17 Potential to Achieve Goals: Good Progress towards PT goals: Progressing toward goals    Frequency    Min 2X/week  PT Plan Current plan remains appropriate    Co-evaluation              AM-PAC PT "6 Clicks" Daily Activity  Outcome Measure  Difficulty turning over in bed (including adjusting bedclothes, sheets and blankets)?: A Little Difficulty moving from lying on back to sitting on the side of the bed? : Unable Difficulty sitting down on and standing up from a chair with arms (e.g., wheelchair, bedside commode, etc,.)?: Unable Help needed moving to and from a bed to chair (including a wheelchair)?: A Little Help needed walking in hospital room?: A Little Help needed climbing 3-5 steps with a railing? : A Lot 6 Click Score: 13    End of  Session Equipment Utilized During Treatment: Gait belt Activity Tolerance: Patient tolerated treatment well Patient left: in bed;with call bell/phone within reach;with bed alarm set;with family/visitor present Nurse Communication: Mobility status PT Visit Diagnosis: Unsteadiness on feet (R26.81);Muscle weakness (generalized) (M62.81)     Time: 7412-8786 PT Time Calculation (min) (ACUTE ONLY): 17 min  Charges:  $Gait Training: 8-22 mins                     Lanney Gins, PT, DPT 11/04/17 11:32 AM Pager: [phone]

## 2017-11-05 ENCOUNTER — Encounter (HOSPITAL_COMMUNITY): Payer: Self-pay

## 2017-11-05 DIAGNOSIS — E1122 Type 2 diabetes mellitus with diabetic chronic kidney disease: Secondary | ICD-10-CM | POA: Diagnosis not present

## 2017-11-05 DIAGNOSIS — I25810 Atherosclerosis of coronary artery bypass graft(s) without angina pectoris: Secondary | ICD-10-CM | POA: Diagnosis not present

## 2017-11-05 DIAGNOSIS — Z794 Long term (current) use of insulin: Secondary | ICD-10-CM | POA: Diagnosis not present

## 2017-11-05 DIAGNOSIS — G9340 Encephalopathy, unspecified: Secondary | ICD-10-CM | POA: Diagnosis not present

## 2017-11-05 DIAGNOSIS — K210 Gastro-esophageal reflux disease with esophagitis: Secondary | ICD-10-CM | POA: Diagnosis not present

## 2017-11-05 DIAGNOSIS — I959 Hypotension, unspecified: Secondary | ICD-10-CM | POA: Diagnosis not present

## 2017-11-05 DIAGNOSIS — I5032 Chronic diastolic (congestive) heart failure: Secondary | ICD-10-CM | POA: Diagnosis not present

## 2017-11-05 DIAGNOSIS — R4781 Slurred speech: Secondary | ICD-10-CM | POA: Diagnosis not present

## 2017-11-05 DIAGNOSIS — E119 Type 2 diabetes mellitus without complications: Secondary | ICD-10-CM | POA: Diagnosis not present

## 2017-11-05 DIAGNOSIS — Z7982 Long term (current) use of aspirin: Secondary | ICD-10-CM | POA: Diagnosis not present

## 2017-11-05 DIAGNOSIS — M6281 Muscle weakness (generalized): Secondary | ICD-10-CM | POA: Diagnosis not present

## 2017-11-05 DIAGNOSIS — Z7401 Bed confinement status: Secondary | ICD-10-CM | POA: Diagnosis not present

## 2017-11-05 DIAGNOSIS — I10 Essential (primary) hypertension: Secondary | ICD-10-CM | POA: Diagnosis not present

## 2017-11-05 DIAGNOSIS — R296 Repeated falls: Secondary | ICD-10-CM | POA: Diagnosis not present

## 2017-11-05 DIAGNOSIS — R5381 Other malaise: Secondary | ICD-10-CM | POA: Diagnosis not present

## 2017-11-05 DIAGNOSIS — I6522 Occlusion and stenosis of left carotid artery: Secondary | ICD-10-CM | POA: Diagnosis not present

## 2017-11-05 DIAGNOSIS — I5020 Unspecified systolic (congestive) heart failure: Secondary | ICD-10-CM | POA: Diagnosis not present

## 2017-11-05 DIAGNOSIS — Z951 Presence of aortocoronary bypass graft: Secondary | ICD-10-CM | POA: Diagnosis not present

## 2017-11-05 DIAGNOSIS — E785 Hyperlipidemia, unspecified: Secondary | ICD-10-CM | POA: Diagnosis not present

## 2017-11-05 DIAGNOSIS — N184 Chronic kidney disease, stage 4 (severe): Secondary | ICD-10-CM | POA: Diagnosis not present

## 2017-11-05 DIAGNOSIS — G9349 Other encephalopathy: Secondary | ICD-10-CM | POA: Diagnosis not present

## 2017-11-05 DIAGNOSIS — R1310 Dysphagia, unspecified: Secondary | ICD-10-CM | POA: Diagnosis not present

## 2017-11-05 DIAGNOSIS — I130 Hypertensive heart and chronic kidney disease with heart failure and stage 1 through stage 4 chronic kidney disease, or unspecified chronic kidney disease: Secondary | ICD-10-CM | POA: Diagnosis not present

## 2017-11-05 DIAGNOSIS — M2550 Pain in unspecified joint: Secondary | ICD-10-CM | POA: Diagnosis not present

## 2017-11-05 DIAGNOSIS — Z79899 Other long term (current) drug therapy: Secondary | ICD-10-CM | POA: Diagnosis not present

## 2017-11-05 DIAGNOSIS — Z87891 Personal history of nicotine dependence: Secondary | ICD-10-CM | POA: Diagnosis not present

## 2017-11-05 DIAGNOSIS — I639 Cerebral infarction, unspecified: Secondary | ICD-10-CM | POA: Diagnosis not present

## 2017-11-05 DIAGNOSIS — W19XXXA Unspecified fall, initial encounter: Secondary | ICD-10-CM | POA: Diagnosis not present

## 2017-11-05 DIAGNOSIS — I480 Paroxysmal atrial fibrillation: Secondary | ICD-10-CM | POA: Diagnosis not present

## 2017-11-05 DIAGNOSIS — I2510 Atherosclerotic heart disease of native coronary artery without angina pectoris: Secondary | ICD-10-CM | POA: Diagnosis not present

## 2017-11-05 DIAGNOSIS — Z8673 Personal history of transient ischemic attack (TIA), and cerebral infarction without residual deficits: Secondary | ICD-10-CM | POA: Diagnosis not present

## 2017-11-05 LAB — GLUCOSE, CAPILLARY
GLUCOSE-CAPILLARY: 128 mg/dL — AB (ref 70–99)
Glucose-Capillary: 235 mg/dL — ABNORMAL HIGH (ref 70–99)

## 2017-11-05 MED ORDER — CLOPIDOGREL BISULFATE 75 MG PO TABS: 75.0000 mg | ORAL_TABLET | Freq: Every day | ORAL | 0 refills | Status: DC

## 2017-11-05 MED ORDER — CYANOCOBALAMIN 2000 MCG PO TABS: 2000.0000 ug | ORAL_TABLET | Freq: Every day | ORAL | 0 refills | Status: AC

## 2017-11-05 NOTE — Clinical Social Work Placement (Signed)
Nurse to call report to [phone], Room 117B     CLINICAL SOCIAL WORK PLACEMENT  NOTE  Date:  11/05/2017  Patient Details  Name: Destiny [name] MRN: [id number] Date of Birth: Aug 22, 1931  Clinical Social Work is seeking post-discharge placement for this patient at the [facility] level of care (*CSW will initial, date and re-position this form in  chart as items are completed):  Yes   Patient/family provided with [facility] Work Department's list of facilities offering this level of care within the geographic area requested by the patient (or if unable, by the patient's family).  Yes   Patient/family informed of their freedom to choose among providers that offer the needed level of care, that participate in Medicare, Medicaid or managed care program needed by the patient, have an available bed and are willing to accept the patient.  Yes   Patient/family informed of [facility]'s ownership interest in [facility] and [facility], as well as of the fact that they are under no obligation to receive care at these facilities.  PASRR submitted to EDS on 11/02/17     PASRR number received on 11/02/17     Existing PASRR number confirmed on       FL2 transmitted to all facilities in geographic area requested by pt/family on 11/02/17     FL2 transmitted to all facilities within larger geographic area on       Patient informed that his/her managed care company has contracts with or will negotiate with certain facilities, including the following:        Yes   Patient/family informed of bed offers received.  Patient chooses bed at [facility]     Physician recommends and patient chooses bed at      Patient to be transferred to [facility] on 11/05/17.  Patient to be transferred to facility by [facility]     Patient family notified on 11/05/17 of transfer.  Name of family member notified:  Vernia Buff     PHYSICIAN        Additional Comment:    _______________________________________________ Geralynn Ochs, LCSW 11/05/2017, 11:00 AM

## 2017-11-05 NOTE — Progress Notes (Signed)
Patient discharging to [facility]. All belongins and discharge papers sent with [facility] Nurse will call report

## 2017-11-05 NOTE — Discharge Summary (Signed)
Physician Discharge Summary  BRISTYL MCLEES WPY:[id number] DOB: 1931/08/15 DOA: 10/31/2017  PCP: Mosie Lukes, MD  Admit date: 10/31/2017 Discharge date: 11/05/2017  Time spent: 40 minutes  Recommendations for Outpatient Follow-up:  1. Follow outpatient CBC/CMP 2. Consider palliative care c/s as outpatient 3. Plavix added to aspirin due to intracranial atherosclerosis.  Ensure follow up with neurology as outpatient. 4. Follow blood pressure.  Amlodipine increased to 10 mg daily, hydral started.   5. Follow blood sugars.  Lantus decreased to 15 units. 6. Continue thiamine and b12.  Follow outpatient. 7. Lasix d/c'd, follow creatine and volume status. 8. Follow proteinuria as outpatient   Discharge Diagnoses:  Principal Problem:   Acute encephalopathy Active Problems:   Essential hypertension   Diabetes mellitus type 2, insulin dependent (HCC)   CAD (coronary artery disease) of artery bypass graft   CKD (chronic kidney disease) stage 4, GFR 15-29 ml/min (HCC)   History of CVA (cerebrovascular accident)   Chronic diastolic heart failure (HCC)   AF (paroxysmal atrial fibrillation) ([place])   Discharge Condition: stable  Filed Weights   11/01/17 0641 11/02/17 0500 11/05/17 0500  Weight: 71.8 kg 70.6 kg 71.6 kg    History of present illness:  Destiny [name] is an 84 y.o. female with medical history significant forCAD, type 2 diabetes mellitus, hypertension, history of CVA, paroxysmal atrial fibrillation no longer on anticoagulation due to recurrent falls, now presenting to the emergency department for evaluation of headache with garbled speech and increased confusion. Patient was seen in her normal state at approximately 13:30 this afternoon, later noted by family to be holding her head in her hands, with increased confusion, and garbled speech. She was complaining of Ashana Tullo headache. She was brought into the ED for further evaluation, headache and slurred speech had resolved, and  family reports that her confusion is nearly back to baseline in the ED. Patient was awaiting SNF placement on 8/21 when she had another episode of garbled speech and right sided weakness.  MRI was ordered which showed no acute findings, but did show severe L carvernous ICA narrowing and severe stenosis versus occlusion at proximal R P2 segement and no meaningful flow in R vertebral artery  Neurology recommended DAPT and suspected TIA vs behavioral change.      Hospital Course:  Acute encephalopathy-- ? Due to elevated BP vs TIA (known episodes in past) -Presents following an episode of transient confusion and slurred speech, complaining of headache at the time and holding her head in her hands -Neurology consult-- replace B12 (333), thiamine level (wnl).  Received IV thiamine here, will start PO thiamine on d/c, will also start PO vitamin B12 as well. -EEG w/o seizure like activity, MRI on 8/19 negative -patient had re-occurrence on 8/21 AM with garbles speech and right sided weakness----MRI stat ordered -> negative - neuro recommending DAPT, concern for TIA vs behavioral change -family does not want invasive measures  History of CVA -Presents after transient neurologic changes that were non-focal - MRI's here without acute finding - she did have severe L cavernous ICA narrowing, no meaningful flow in R vertebral artery and severe stenosis vs occlusion at proximal R P2 segment - Neurology recommending DAPT as noted above for concern for TIA and her intracranial atheroclerosis  Paroxysmal atrial fibrillation -CHADS-VASc 61 (age x2, CVA x2, gender, CHF, DM, CAD, HTN) -Per chart notes from 09/28/17, she was taken off of anticoagulation due to balance issues, refusal to use walker, and recurrent falls  Hypertension  Proteinuria protein spilling into urine -BP elevated in ED, possibly d/t pain, less likely from acute CVA -hydral added.  Norvasc increased.  Continue imdur.  -  follow up UA as outpatient to follow proetinuria  Insulin-dependent DM -A1c was 7.5% last month -lantus dose decreased -SSI  CKD stage IV -follow outpatient  Anemia -prob related to chronic disease - follow up outpatient - continue B12   CAD -No anginal complaints -Continue Lipitor, ASA 81, and Imdur  Chronic diastolic CHF -Preserved EF on echo on last month -Appears compensated, uses Lasix only prn at home -Hold Lasix, follow daily wt - BNP elevated, but euvolemic, follow outpatient volume   Urine cx with >100,000 colonies of coag negative staph-- suspect contaminant   Procedures:  Technical Digital EEG recording using 10-20 international electrode system  Description of the recording Posterior dominant rhythm is 6-'8Hz'$  symmetrical reactive EEG comprises of intermittent generalized delta slowing Epileptiform features were not seen during this recording.  Impression  The EEG is abnormal and findings are consistent with mild generalized cerebral dysfunction.  Epileptiform features were not seen during this recording.  Consultations:  neurology  Discharge Exam: Vitals:   11/05/17 0750 11/05/17 1119  BP: (!) 138/54 (!) 165/50  Pulse: 65 66  Resp: 16 16  Temp: 98.7 F (37.1 C) 98.6 F (37 C)  SpO2: 97% 99%   Deegan Valentino&Ox1. Denies pain. Repetitive speech, some stuttureing Spoke with son over phone. Reviewed prior notes and discussed with prior providers, seems similar to previous exams.  General: No acute distress. Cardiovascular: Heart sounds show Leasha Goldberger regular rate, and rhythm.  Lungs: Clear to auscultation bilaterally with good air movement. No rales, rhonchi or wheezes. Abdomen: Soft, nontender, nondistended  Neurological: Alert and oriented 1. Moves all extremities 4 with equal strength. Cranial nerves II through XII grossly intact.  Stuttering and repetitive speech at times.  Skin: Warm and dry. No rashes or lesions. Extremities:  No clubbing or cyanosis. No edema.  Psychiatric: Mood and affect are normal. Insight and judgment are appropriate.   Discharge Instructions   Discharge Instructions    Ambulatory referral to Neurology   Complete by:  As directed    An appointment is requested in approximately: 2 weeks   Diet - low sodium heart healthy   Complete by:  As directed    Discharge instructions   Complete by:  As directed    You were seen for altered mental status.  This was thought to be due to Lc Joynt TIA vs Anaka Beazer behavioral change.  We added on plavix to your aspirin because of the narrowing of the blood vessels in your brain.  I referred you to neurology.    We started you on thiamine and vitamin b12.  Please follow up as an outpatient regarding this as well.  Return for new, recurrent, or worsening symptoms.  Please ask your PCP to request records from this hospitalization so they know what was done and what the next steps will be.   Increase activity slowly   Complete by:  As directed      Allergies as of 11/05/2017   No Known Allergies     Medication List    STOP taking these medications   blood glucose meter kit and supplies Kit   cefdinir 300 MG capsule Commonly known as:  OMNICEF   ferrous sulfate 325 (65 FE) MG tablet   furosemide 20 MG tablet Commonly known as:  LASIX   Vitamin D 1000 units capsule  TAKE these medications   albuterol 108 (90 Base) MCG/ACT inhaler Commonly known as:  PROVENTIL HFA;VENTOLIN HFA Inhale 2 puffs into the lungs every 6 (six) hours as needed for wheezing or shortness of breath.   amLODipine 10 MG tablet Commonly known as:  NORVASC Take 1 tablet (10 mg total) by mouth daily. What changed:    medication strength  how much to take   aspirin EC 81 MG tablet Take 1 tablet (81 mg total) by mouth daily.   atorvastatin 20 MG tablet Commonly known as:  LIPITOR Take 1 tablet (20 mg total) by mouth daily.   citalopram 20 MG tablet Commonly known as:   CELEXA Take 1 tablet (20 mg total) by mouth daily.   clopidogrel 75 MG tablet Commonly known as:  PLAVIX Take 1 tablet (75 mg total) by mouth daily.   cyanocobalamin 2000 MCG tablet Take 1 tablet (2,000 mcg total) by mouth daily. Start taking on:  11/06/2017   Fish Oil 1200 MG Caps Take 1,200 mg by mouth daily.   hydrALAZINE 25 MG tablet Commonly known as:  APRESOLINE Take 1 tablet (25 mg total) by mouth every 8 (eight) hours.   insulin glargine 100 UNIT/ML injection Commonly known as:  LANTUS Inject 0.15 mLs (15 Units total) into the skin daily. What changed:    how much to take  how to take this  when to take this  additional instructions   insulin lispro 100 UNIT/ML KiwkPen Commonly known as:  HUMALOG Inject 0.05-0.1 mLs (5-10 Units total) into the skin 3 (three) times daily with meals.   ipratropium 17 MCG/ACT inhaler Commonly known as:  ATROVENT HFA Inhale 2 puffs into the lungs every 6 (six) hours as needed for wheezing.   isosorbide mononitrate 30 MG 24 hr tablet Commonly known as:  IMDUR Take 1 tablet (30 mg total) by mouth at bedtime. What changed:  when to take this   nitroGLYCERIN 0.4 MG SL tablet Commonly known as:  NITROSTAT Place 0.4 mg under the tongue every 5 (five) minutes as needed for chest pain.   pantoprazole 40 MG tablet Commonly known as:  PROTONIX Take 1 tablet (40 mg total) by mouth daily.   ranitidine 300 MG tablet Commonly known as:  ZANTAC Take 150 mg by mouth at bedtime.   thiamine 100 MG tablet Commonly known as:  VITAMIN B-1 Take 1 tablet (100 mg total) by mouth daily.      No Known Allergies  Contact information for follow-up providers    Mosie Lukes, MD Follow up.   Specialty:  Family Medicine Contact information: [place] [address] [phone]            Contact information for after-discharge care    Destination    [facility] .    Service:  Skilled Nursing Contact information: [address] [place] [phone]                   The results of significant diagnostics from this hospitalization (including imaging, microbiology, ancillary and laboratory) are listed below for reference.    Significant Diagnostic Studies: Dg Chest 2 View  Result Date: 10/31/2017 CLINICAL DATA:  Altered mental status EXAM: CHEST - 2 VIEW COMPARISON:  October 12, 2017 FINDINGS: No evident edema or consolidation. Heart is mildly enlarged with pulmonary vascularity normal. Patient is status post coronary artery bypass grafting. There is aortic atherosclerosis. No pneumothorax. No evident  bone lesions. IMPRESSION: No edema or consolidation. Heart mildly enlarged. There is aortic atherosclerosis. Aortic Atherosclerosis (ICD10-I70.0). Electronically Signed   By: Lowella Grip III M.D.   On: 10/31/2017 20:45   Dg Chest 2 View  Result Date: 10/12/2017 CLINICAL DATA:  Shortness of breath and wheezing since last night, former smoker, chronic kidney disease, diabetes mellitus, stroke, hypertension, former smoker EXAM: CHEST - 2 VIEW COMPARISON:  05/21/2017 FINDINGS: Enlargement of cardiac silhouette post CABG. Calcified tortuous thoracic aorta. Pulmonary vascularity normal. Peribronchial thickening with probable small bibasilar pleural effusions and mild LEFT basilar atelectasis. Underlying emphysematous changes. Upper lungs clear. No pneumothorax or acute osseous findings. Bones appear demineralized. IMPRESSION: Enlargement of cardiac silhouette post CABG. COPD changes with probable small bibasilar pleural effusions and mild LEFT basilar atelectasis. Electronically Signed   By: Lavonia Dana M.D.   On: 10/12/2017 11:24   Ct Head Wo Contrast  Result Date: 10/28/2017 CLINICAL DATA:  84 year old female with syncope, fall. Confusion, slurred speech. EXAM: CT HEAD WITHOUT CONTRAST TECHNIQUE: Contiguous axial images were  obtained from the base of the skull through the vertex without intravenous contrast. COMPARISON:  Brain MRI 05/22/2017. Head CT without contrast 05/21/2017 and earlier. FINDINGS: Brain: Chronic infarcts in the left corona radiata, left basal ganglia, right PCA territory. Chronic lacunar infarcts in the right basal ganglia. Stable gray-white matter differentiation throughout the brain. Stable cerebral volume. No midline shift, ventriculomegaly, mass effect, evidence of mass lesion, intracranial hemorrhage or evidence of cortically based acute infarction. Vascular: Extensive Calcified atherosclerosis at the skull base. No suspicious intracranial vascular hyperdensity. Skull: Stable and intact. Sinuses/Orbits: Visualized paranasal sinuses and mastoids are stable and well pneumatized. Other: No scalp hematoma identified. No acute orbit or scalp soft tissue findings. IMPRESSION: 1. No acute intracranial abnormality. No acute traumatic injury identified. 2. Chronic ischemic disease appears stable since 05/21/2017. Electronically Signed   By: Genevie Ann M.D.   On: 10/28/2017 17:03   Mr Jodene Nam Head Wo Contrast  Result Date: 11/03/2017 CLINICAL DATA:  Speech difficulty EXAM: MRI HEAD WITHOUT CONTRAST MRA HEAD WITHOUT CONTRAST MRA NECK WITHOUT CONTRAST TECHNIQUE: Multiplanar, multiecho pulse sequences of the brain and surrounding structures were obtained without intravenous contrast. Angiographic images of the Circle of Willis were obtained using MRA technique without intravenous contrast. Angiographic images of the neck were obtained using MRA technique without intravenous contrast. Carotid stenosis measurements (when applicable) are obtained utilizing NASCET criteria, using the distal internal carotid diameter as the denominator. COMPARISON:  Brain MRI from 2 days ago FINDINGS: MRI HEAD FINDINGS Brain: No acute infarction, hemorrhage, hydrocephalus, extra-axial collection or mass lesion. Moderate remote right occipital cortex  infarct. Small remote right inferior temporal occipital infarct. Remote lateral lenticulostriate striate infarct on the left affecting white matter and basal ganglia. There is hazy FLAIR hyperintensity in the left more than right cerebral white matter. Generalized atrophy. Vascular: Major flow voids are preserved. Skull and upper cervical spine: No evidence of marrow lesion Sinuses/Orbits: Bilateral cataract resection MRA HEAD FINDINGS Symmetric carotid arteries in the upper neck. There is diffuse narrowing of the bilateral cavernous carotids with particularly severe stenosis on the left. Extensive atherosclerotic irregularity of bilateral MCAs. No meaningful flow seen within the presumably non dominant right vertebral artery. There is severe atherosclerotic irregularity of the left V4 and proximal basilar. The proximal basilar shows Charles Andringa fenestration. Branch occlusion or flow reducing/severe stenosis at the proximal right P2 segment. MRA NECK FINDINGS Nondiagnostic due to motion IMPRESSION: Brain MRI: 1. No acute finding. 2. Advanced  atrophy and chronic small vessel ischemic injury as described. 3. Motion degraded. Intracranial MRA: 1. Severe atherosclerotic irregularity. 2. Severe left cavernous ICA narrowing. 3. No meaningful flow in the right vertebral artery which may be non dominant. Moderate narrowing at the vertebrobasilar junction. 4. Severe stenosis versus occlusion at the proximal right P2 segment. Neck MRA: Nondiagnostic due to motion Electronically Signed   By: Monte Fantasia M.D.   On: 11/03/2017 15:36   Mr Jodene Nam Neck Wo Contrast  Result Date: 11/03/2017 CLINICAL DATA:  Speech difficulty EXAM: MRI HEAD WITHOUT CONTRAST MRA HEAD WITHOUT CONTRAST MRA NECK WITHOUT CONTRAST TECHNIQUE: Multiplanar, multiecho pulse sequences of the brain and surrounding structures were obtained without intravenous contrast. Angiographic images of the Circle of Willis were obtained using MRA technique without intravenous  contrast. Angiographic images of the neck were obtained using MRA technique without intravenous contrast. Carotid stenosis measurements (when applicable) are obtained utilizing NASCET criteria, using the distal internal carotid diameter as the denominator. COMPARISON:  Brain MRI from 2 days ago FINDINGS: MRI HEAD FINDINGS Brain: No acute infarction, hemorrhage, hydrocephalus, extra-axial collection or mass lesion. Moderate remote right occipital cortex infarct. Small remote right inferior temporal occipital infarct. Remote lateral lenticulostriate striate infarct on the left affecting white matter and basal ganglia. There is hazy FLAIR hyperintensity in the left more than right cerebral white matter. Generalized atrophy. Vascular: Major flow voids are preserved. Skull and upper cervical spine: No evidence of marrow lesion Sinuses/Orbits: Bilateral cataract resection MRA HEAD FINDINGS Symmetric carotid arteries in the upper neck. There is diffuse narrowing of the bilateral cavernous carotids with particularly severe stenosis on the left. Extensive atherosclerotic irregularity of bilateral MCAs. No meaningful flow seen within the presumably non dominant right vertebral artery. There is severe atherosclerotic irregularity of the left V4 and proximal basilar. The proximal basilar shows Margarita Croke fenestration. Branch occlusion or flow reducing/severe stenosis at the proximal right P2 segment. MRA NECK FINDINGS Nondiagnostic due to motion IMPRESSION: Brain MRI: 1. No acute finding. 2. Advanced atrophy and chronic small vessel ischemic injury as described. 3. Motion degraded. Intracranial MRA: 1. Severe atherosclerotic irregularity. 2. Severe left cavernous ICA narrowing. 3. No meaningful flow in the right vertebral artery which may be non dominant. Moderate narrowing at the vertebrobasilar junction. 4. Severe stenosis versus occlusion at the proximal right P2 segment. Neck MRA: Nondiagnostic due to motion Electronically Signed    By: Monte Fantasia M.D.   On: 11/03/2017 15:36   Mr Brain Wo Contrast  Result Date: 11/03/2017 CLINICAL DATA:  Speech difficulty EXAM: MRI HEAD WITHOUT CONTRAST MRA HEAD WITHOUT CONTRAST MRA NECK WITHOUT CONTRAST TECHNIQUE: Multiplanar, multiecho pulse sequences of the brain and surrounding structures were obtained without intravenous contrast. Angiographic images of the Circle of Willis were obtained using MRA technique without intravenous contrast. Angiographic images of the neck were obtained using MRA technique without intravenous contrast. Carotid stenosis measurements (when applicable) are obtained utilizing NASCET criteria, using the distal internal carotid diameter as the denominator. COMPARISON:  Brain MRI from 2 days ago FINDINGS: MRI HEAD FINDINGS Brain: No acute infarction, hemorrhage, hydrocephalus, extra-axial collection or mass lesion. Moderate remote right occipital cortex infarct. Small remote right inferior temporal occipital infarct. Remote lateral lenticulostriate striate infarct on the left affecting white matter and basal ganglia. There is hazy FLAIR hyperintensity in the left more than right cerebral white matter. Generalized atrophy. Vascular: Major flow voids are preserved. Skull and upper cervical spine: No evidence of marrow lesion Sinuses/Orbits: Bilateral cataract resection MRA HEAD FINDINGS  Symmetric carotid arteries in the upper neck. There is diffuse narrowing of the bilateral cavernous carotids with particularly severe stenosis on the left. Extensive atherosclerotic irregularity of bilateral MCAs. No meaningful flow seen within the presumably non dominant right vertebral artery. There is severe atherosclerotic irregularity of the left V4 and proximal basilar. The proximal basilar shows Romero Letizia fenestration. Branch occlusion or flow reducing/severe stenosis at the proximal right P2 segment. MRA NECK FINDINGS Nondiagnostic due to motion IMPRESSION: Brain MRI: 1. No acute finding. 2.  Advanced atrophy and chronic small vessel ischemic injury as described. 3. Motion degraded. Intracranial MRA: 1. Severe atherosclerotic irregularity. 2. Severe left cavernous ICA narrowing. 3. No meaningful flow in the right vertebral artery which may be non dominant. Moderate narrowing at the vertebrobasilar junction. 4. Severe stenosis versus occlusion at the proximal right P2 segment. Neck MRA: Nondiagnostic due to motion Electronically Signed   By: Monte Fantasia M.D.   On: 11/03/2017 15:36   Mr Brain Wo Contrast  Result Date: 11/01/2017 CLINICAL DATA:  Initial evaluation for acute encephalopathy, headache, garbled speech, increased confusion. EXAM: MRI HEAD WITHOUT CONTRAST TECHNIQUE: Multiplanar, multiecho pulse sequences of the brain and surrounding structures were obtained without intravenous contrast. COMPARISON:  Prior CT from 10/31/2017 as well as previous MRI from 05/22/2017. FINDINGS: Brain: Moderately advanced cerebral atrophy. Patchy and confluent T2/FLAIR hyperintensity within the periventricular and deep white matter, most consistent with chronic small vessel ischemic disease. Multiple remote lacunar infarcts within the left greater than right basal ganglia/corona radiata. Chronic right occipital infarcts. Additional tiny remote left cerebellar infarct. No abnormal foci of restricted diffusion to suggest acute or subacute ischemia. Gray-white matter differentiation maintained. No evidence for acute intracranial hemorrhage. No mass lesion, midline shift or mass effect. Ventricular prominence related to global parenchymal volume loss without hydrocephalus. Cavum et septum pellucidum noted. Incidental note made of Aashika Carta partially empty sella. 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 Major intravascular flow voids otherwise maintained. Skull and upper cervical spine: Craniocervical junction normal. Upper  cervical spine within normal limits. Bone marrow signal intensity normal. Scalp soft tissues within normal limits. Sinuses/Orbits: Patient status post ocular lens replacement bilaterally. Paranasal sinuses are clear. No significant mastoid effusion. Other: None. IMPRESSION: 1. No acute intracranial abnormality. 2. Moderate atrophy with chronic small vessel ischemic disease with multiple chronic infarcts as above, stable from previous. Electronically Signed   By: Jeannine Boga M.D.   On: 11/01/2017 03:47   Ct Head Code Stroke Wo Contrast  Result Date: 10/31/2017 CLINICAL DATA:  Code stroke. Last seen normal at 13 30 hours. Follow up stroke. History of stroke, dementia, hypertension, hyperlipidemia, diabetes. EXAM: CT HEAD WITHOUT CONTRAST TECHNIQUE: Contiguous axial images were obtained from the base of the skull through the vertex without intravenous contrast. COMPARISON:  CT HEAD October 28, 2017 and MRI head May 22, 2017. FINDINGS: BRAIN: No intraparenchymal hemorrhage, mass effect nor midline shift. Moderate parenchymal brain volume loss, cavum septum pellucidum. Advanced mid brain volume loss. RIGHT mesial temporal occipital lobe encephalomalacia. Adjacent RIGHT parietal lobe encephalomalacia. Old LEFT greater than RIGHT basal ganglia cystic infarct with ex vacuo dilatation subjacent ventricle. Patchy supratentorial white matter hypodensities within normal range for patient's age, though non-specific are most compatible with chronic small vessel ischemic disease. No acute large vascular territory infarcts. No abnormal extra-axial fluid collections. Basal cisterns are patent. VASCULAR: Severe calcific atherosclerosis of the carotid siphons. SKULL: No skull fracture. No significant  scalp soft tissue swelling. SINUSES/ORBITS: Trace paranasal sinus mucosal thickening. Mastoid air cells are well aerated.The included ocular globes and orbital contents are non-suspicious. Status post bilateral ocular lens  implants. OTHER: Patient is edentulous. ASPECTS [facility] Stroke Program Early CT Score) - Ganglionic level infarction (caudate, lentiform nuclei, internal capsule, insula, M1-M3 cortex): 7 - Supraganglionic infarction (M4-M6 cortex): 3 Total score (0-10 with 10 being normal): 10 IMPRESSION: 1. No acute intracranial process. 2. ASPECTS is 10. 3. Chronic changes including old RIGHT PCA territory, RIGHT posterior border zone posterior and small basal ganglia infarcts. 4. Severe atherosclerosis. 5. Critical Value/emergent results text paged to [place] via [employer] secure system on 10/31/2017 at 7:17 pm, including interpreting physician's phone number. Electronically Signed   By: Elon Alas M.D.   On: 10/31/2017 19:19    Microbiology: Recent Results (from the past 240 hour(s))  Urine Culture     Status: Abnormal   Collection Time: 10/28/17 11:29 AM  Result Value Ref Range Status   MICRO NUMBER: [id number]  Final   SPECIMEN QUALITY: ADEQUATE  Final   Sample Source NOT GIVEN  Final   STATUS: FINAL  Final   ISOLATE 1: Coagulase negative staphylococcus, not S. (Sissy Goetzke)  Final    Comment: Greater than 100,000 CFU/mL of Coagulase negative staphylococcus, not S. saprophyticus      Susceptibility   Coagulase negative staphylococcus, not s. - URINE CULTURE POSITIVE 1    VANCOMYCIN 1 Sensitive     CIPROFLOXACIN <=0.5 Sensitive     LEVOFLOXACIN <=0.12 Sensitive     GENTAMICIN <=0.5 Sensitive     NITROFURANTOIN <=16 Sensitive     OXACILLIN* NR Resistant      * Oxacillin-resistant staphylococci are resistant toall currently available beta-lactam antimicrobialagents including penicillins, beta lactam/beta-lactamase inhibitor combinations, and cephems withstaphylococcal indications, including Cefazolin.    TETRACYCLINE 2 Sensitive     TRIMETH/SULFA* 160 Resistant      * Oxacillin-resistant staphylococci are resistant toall currently available beta-lactam antimicrobialagents including penicillins, beta  lactam/beta-lactamase inhibitor combinations, and cephems withstaphylococcal indications, including Cefazolin.Legend:S = Susceptible  I = IntermediateR = Resistant  NS = Not susceptible* = Not tested  NR = Not reported**NN = See antimicrobic comments  Culture, blood (Routine X 2) w Reflex to ID Panel     Status: None (Preliminary result)   Collection Time: 11/01/17  5:02 PM  Result Value Ref Range Status   Specimen Description BLOOD ARM  Final   Special Requests   Final    BOTTLES DRAWN AEROBIC AND ANAEROBIC Blood Culture adequate volume   Culture   Final    NO GROWTH 4 DAYS Performed at [facility], [place] [address] [place], [place] [address]    Report Status PENDING  Incomplete  Culture, blood (single) w Reflex to ID Panel     Status: None (Preliminary result)   Collection Time: 11/01/17  5:07 PM  Result Value Ref Range Status   Specimen Description BLOOD LEFT ANTECUBITAL  Final   Special Requests   Final    BOTTLES DRAWN AEROBIC AND ANAEROBIC Blood Culture adequate volume   Culture   Final    NO GROWTH 4 DAYS Performed at [facility], [place] [address] [place], [place] [address]    Report Status PENDING  Incomplete     Labs: Basic Metabolic Panel: Recent Labs  Lab 10/31/17 2140 11/01/17 0606 11/02/17 0825  NA 138 141 139  K 3.5 3.5 3.6  CL 104 106 109  CO2 26  26 23  GLUCOSE 87 99 96  BUN 24* 23 21  CREATININE 1.93* 1.68* 1.90*  CALCIUM 9.2 9.0 8.7*   Liver Function Tests: Recent Labs  Lab 10/31/17 2140  AST 16  ALT 13  ALKPHOS 64  BILITOT 1.3*  PROT 6.6  ALBUMIN 3.3*   No results for input(s): LIPASE, AMYLASE in the last 168 hours. Recent Labs  Lab 10/31/17 2140  AMMONIA 17   CBC: Recent Labs  Lab 10/31/17 1940 11/02/17 0825  WBC 8.3 8.2  NEUTROABS 5.4  --   HGB 11.9* 10.9*  HCT 36.6 33.6*  MCV 91.5 92.1  PLT 197 189   Cardiac Enzymes: No results for input(s): CKTOTAL, CKMB, CKMBINDEX, TROPONINI in the last 168 hours. BNP: BNP  (last 3 results) Recent Labs    10/12/17 1057 11/01/17 1609  BNP 510.8* 277.5*    ProBNP (last 3 results) Recent Labs    05/21/17 1430  PROBNP 210.0*    CBG: Recent Labs  Lab 11/04/17 1639 11/04/17 1641 11/04/17 2153 11/05/17 0628 11/05/17 1118  GLUCAP 184* 198* 97 128* 235*       Signed:  Fayrene Helper MD.  [facility] 11/05/2017, 12:08 PM

## 2017-11-06 LAB — CULTURE, BLOOD (SINGLE)
Culture: NO GROWTH
SPECIAL REQUESTS: ADEQUATE

## 2017-11-06 LAB — CULTURE, BLOOD (ROUTINE X 2)
Culture: NO GROWTH
Special Requests: ADEQUATE

## 2017-11-08 ENCOUNTER — Telehealth: Payer: Self-pay

## 2017-11-08 NOTE — Telephone Encounter (Signed)
TCM hospital follow up call made. Patients son states patient was discharged to SNF [facility]).

## 2017-11-12 DIAGNOSIS — W19XXXA Unspecified fall, initial encounter: Secondary | ICD-10-CM | POA: Diagnosis not present

## 2017-11-12 DIAGNOSIS — M6281 Muscle weakness (generalized): Secondary | ICD-10-CM | POA: Diagnosis not present

## 2017-11-25 ENCOUNTER — Telehealth: Payer: Self-pay | Admitting: Family Medicine

## 2017-11-25 NOTE — Telephone Encounter (Signed)
Copied from [place] [id number]. Topic: Quick Communication - See Telephone Encounter >> Nov 25, 2017 10:39 AM Ahmed Prima L wrote: CRM for notification. See Telephone encounter for: 11/25/17.  Darlina Guys, RN with [facility] call and said the patient will be discharging from [facility] tomorrow. She will be going out on 9/16 to start nursing services.

## 2017-11-26 MED FILL — CLOPIDOGREL 75 MG TABLET: 75 | 30 days supply | Qty: 30 | Fill #0

## 2017-11-26 MED FILL — hydrALAZINE HCL 25 MG TABS: 25 | 30 days supply | Qty: 90 | Fill #0

## 2017-11-29 ENCOUNTER — Other Ambulatory Visit: Payer: Self-pay

## 2017-11-29 NOTE — Patient Outreach (Signed)
[facility]) Care Management  11/29/2017  Destiny [name] April 11, 1931 [id number]  EMMI: EMMI general discharge red alert. Referral date: 11/29/17 Referral reason: Got discharge paper: I don't know Insurance: [employer] health care Day # 1  Telephone call to patient regarding EMMI general discharge red alert.  Spoke with Destiny [name] who is patients caregiver and on [facility] consent form.  HIPAA verified by caregiver for patient. Explained reason for call.  Caregiver states patient was in the [facility] after hospital discharge due to patient having a fall and having a mini stroke.  Caregiver states patient is doing pretty good at this time. She states patient has her medications and takes as prescribed. Caregiver states patient has a follow up appointment with her primary MD on 12/02/17 and a follow up with the neurologist on 12/07/17. Caregiver states she provides patient with transportation.  Caregiver states patient had [facility] prior to being admitted to the hospital. She states she will call [facility] today for services to resume. Caregiver denies patient having any new symptoms at this time. Caregiver states she will speak with patients son about getting patient involved in [employer] program.   RNCM discussed stroke signs/ symptoms and advised that 911 should be called for these symptoms.  Caregiver denies patient having any further needs.  RNCM offered to send [facility] brochure/ magnet for future reference.  RNCM advised patient to notify MD of any changes in condition prior to scheduled appointment. RNCM verified patient aware of 911 services for urgent/ emergent needs.,  PLAN: RNCM will close patient duet to patient being assessed and having no further needs.  RNC,M will send [facility] brochure/ magnet to patient RNCM will send closure notification to patients doctor.   Quinn Plowman RN,BSN,CCM [facility] Telephonic  [phone]

## 2017-12-02 MED FILL — AMLODIPINE BESYLATE 5 MG TA: 5 | 90 days supply | Qty: 90 | Fill #0

## 2017-12-03 ENCOUNTER — Telehealth: Payer: Self-pay | Admitting: Family Medicine

## 2017-12-03 NOTE — Telephone Encounter (Signed)
Copied from [place] [id number]. Topic: General - Other >> Dec 03, 2017 12:44 PM Yvette Rack wrote: Reason for CRM: Nurse Melissa from [facility] [phone] calling for verbal start of care date for 01-06-18 it took them a while to find pt she was out of town

## 2017-12-03 NOTE — Telephone Encounter (Signed)
Spoke w/ Melissa- verbal given.

## 2017-12-07 ENCOUNTER — Encounter: Payer: Self-pay | Admitting: Neurology

## 2017-12-07 ENCOUNTER — Ambulatory Visit: Payer: Medicare Other | Admitting: Neurology

## 2017-12-07 VITALS — BP 154/71 | HR 56 | Ht 67.0 in | Wt 152.0 lb

## 2017-12-07 DIAGNOSIS — R4789 Other speech disturbances: Secondary | ICD-10-CM | POA: Diagnosis not present

## 2017-12-07 MED ORDER — LEVETIRACETAM ER 500 MG PO TB24: 500.0000 mg | ORAL_TABLET | Freq: Every day | ORAL | 3 refills | Status: DC

## 2017-12-07 MED ORDER — LEVETIRACETAM ER 500 MG PO TB24: 500.0000 mg | ORAL_TABLET | Freq: Every day | ORAL | 1 refills | Status: DC

## 2017-12-07 MED ORDER — LEVETIRACETAM ER 500 MG PO TB24: 500.0000 mg | ORAL_TABLET | Freq: Every day | ORAL | Status: DC

## 2017-12-07 MED FILL — LEVETIRACETAM ER 500 MG TAB: 500 | 30 days supply | Qty: 30 | Fill #0

## 2017-12-07 NOTE — Progress Notes (Signed)
[facility] [address] [place]. [place] [address] [phone]       OFFICE FOLLOW-UP NOTE  Destiny [name] Date of Birth:  [birth year] Medical Record Number:  [id number]   HPI: Destiny [name] is a pleasant 84 year Caucasian lady seen today for first office follow-up visit following hospital admission for stroke in November 2019. History is obtained from the patient, son and caregiver who accompany her today for this visit. I have personally reviewed hospital electronic medical records and imaging films.Yoshi Vicencio Brooksis an 84 y.o.femalewith past medical history of coronary artery disease status post CABG, hypertension, diabetes, GI bleeding, TIAs, CKD, mild dementiawho presents with sudden onset aphasia noticed her on 6:30 - 7 pm.The patient was apparently normal this morning however around 3 PM home health noticed that she had a dazed look and wasconfused.This lasted for a few minutes and the patient returned to her baseline. Her granddaughter checked on her at5 PM and grandson around 5:20 PM and she was perfectly fine and had was having a normal conversation. However son calledbetween 6:30 and 7 PM,she seemed very confused and not making sense. He called 911 and when paramedics arrived it was noted the patient had difficulty with language. Blood pressure was 213 systolic on arrival.Patient was brought to to [facility] ER around9PM. Examination was consistent for isolated aphasiathe patient having difficulty naming, repetition and paraphasic errors. CT head was negative. CTA was not performed due toCKDand low suspicion of largevessel occlusion. After discussing risks versus benefits with family and decided to administer TPA. TPA was administered after lowering blood pressure with labetalol. Date last known well:11.19.18 Time last known well:5.20 pm.tPA Given:yes. NIHSS:3.Baseline MRS2. The patient showed neurological improvement shortly after getting TPA.  She was monitored in intensive care unit and blood pressure was tightly controlled. MRI scan of the brain did not show any acute infarct. MRA of the neck showed possible high-grade left ICA stenosis however carotid ultrasound showed only 1-39% stenosis bilaterally. EEG shows focal left-sided and diffuse generalized slowing but no epileptiform activity. LDL cholesterol was 77 mg percent and hemoglobin A1c was 8.4. Patient was started on Plavix for stroke prevention. She has done well since discharge. She is living at home. She had mild Alzheimer's dementia at baseline. In the past she has not tolerated forgot any benefit from Aricept and Namenda and in fact aspirin her son and she showed improvement after those medications were discontinued. She has a caregiver for 4 hours daily. She is alone at night. Her daughter lives right next door and keeps a close watch on her. Patient is tolerating Plavix without bleeding or bruising. Her blood pressure up appears to be reasonably well controlled and today it is 141/73. Her fasting sugars continue to be high. She is on insulin and to see her medical doctor regularly. She's had no recurrent episodes of speech disturbance or seizure-like activity. She does have a remote history of possible TIA 4 years ago when she had sudden onset of facial drooping and nearly blacked out. This was transient and she refused to go to the hospital. She saw her primary care physician a few days later and brain imaging was unremarkable. Update 12/07/2017 : She returns for follow-up after last visit 8 months ago.  She is accompanied by her caregiver.  The patient is currently living with her son.  She has caregiver throughout the day.  In August 2019 the patient was found following at home after the caregiver left.  When help  arrived she was found to have some slurred speech and blank look on her face and appeared confused.  She was admitted to [facility] where MRI scan was obtained which  showed no acute abnormalities.  There were advanced changes of chronic small vessel disease.  Intracranial MRA showed severe atherosclerotic irregular narrowing most prominent in the left cavernous ICA.  The right vertebral artery appears to be nondominant but occluded.  There was severe stenosis of of the right posterior cerebral artery P2 segment.  EEG did not show seizure activity and showed mild generalized slowing..  Patient had had a similar episode last year for which she was admitted and thought to have an acute stroke and was treated with IV TPA and MRI had not shown a stroke.  Patient continues to have mild cognitive impairment and memory loss from her baseline dementia.  She is overall quite well behaved at home without any agitation.  There is no delusions or hallucinations.  There are no unsafe behavior she can be easily redirected.  Overall the caregiver feels that her cognitive impairment and memory loss are unchanged.  She remains on aspirin for stroke prevention which is tolerating well without bruising or bleeding.  She is also on Lipitor 20 mg daily.  Plavix was added during the recent admission as this episode was considered a TIA.  ROS:   14 system review of systems is positive for   memory loss, fatigue, leg swelling, shortness of breath, wheezing and all other systems negative. PMH:  Past Medical History:  Diagnosis Date  . Acute renal insufficiency 12/24/2013  . Amaurosis fugax   . Arthritis 12/06/2012  . Atherosclerosis   . CAD (coronary artery disease)    a. s/p CABG x 4 (LIMA->LAD, VG->Diag, VG->OM1->OM3);  b. 2010 Cath: 3/4 patent grafts (VG->diag occluded), 3vd, sev PVD ->Med Rx;  c. 12/2011 Lexi MV: sm area of mild mid and apical ant wall ischemia ->med rx.  . Carotid bruit   . Chronic kidney disease (CKD) stage G4/A1, severely decreased glomerular filtration rate (GFR) between 15-29 mL/min/1.73 square meter and albuminuria creatinine ratio less than 30 mg/g (HCC)    GFR  25 on 12/25/13  . CVA (cerebral infarction)   . Dementia    pt denies  . Depression 12/24/2013  . Diabetes mellitus type II    insulin dependent.   . Diabetic peripheral neuropathy ([place])   . Diabetic retinopathy   . Diverticulosis   . Duodenal ulcer 2009 and 11/2011   caused GI bleeding  . Dyspnea   . Gallstones 2009   seen on CT scan  . GI bleed 04/2011, 11/2011   found non-bleeding duodenal ulcers  . Hyperlipidemia   . Hypersomnolence 03/12/2013  . Hypertension   . Iron deficiency anemia secondary to blood loss (chronic)   . Melena 1015/2015   PEPTIC ULCER    REQUIRING TRANSFUSION  . Pedal edema 10/12/2016  . PVD (peripheral vascular disease) ([place])   . Rib fracture 04/21/2015  . Stroke ([place])   . Thiamine deficiency 08/13/2015  . UTI (urinary tract infection) 04/08/2014  . Weight loss, non-intentional 01/29/2014    Social History:  Social History   Socioeconomic History  . Marital status: Widowed    Spouse name: Not on file  . Number of children: 4  . Years of education: Not on file  . Highest education level: Not on file  Occupational History  . Occupation: retired    Fish farm manager: RETIRED  Social Needs  .  Financial resource strain: Not on file  . Food insecurity:    Worry: Not on file    Inability: Not on file  . Transportation needs:    Medical: Not on file    Non-medical: Not on file  Tobacco Use  . Smoking status: Former Smoker    Types: Cigarettes    Last attempt to quit: 12/31/2000    Years since quitting: 16.9  . Smokeless tobacco: Never Used  Substance and Sexual Activity  . Alcohol use: No  . Drug use: No  . Sexual activity: Not on file  Lifestyle  . Physical activity:    Days per week: Not on file    Minutes per session: Not on file  . Stress: Not on file  Relationships  . Social connections:    Talks on phone: Not on file    Gets together: Not on file    Attends religious service: Not on file    Active member of club or organization: Not on  file    Attends meetings of clubs or organizations: Not on file    Relationship status: Not on file  . Intimate partner violence:    Fear of current or ex partner: Not on file    Emotionally abused: Not on file    Physically abused: Not on file    Forced sexual activity: Not on file  Other Topics Concern  . Not on file  Social History Narrative   Widowed - husband passed away in 05/15/2009   lives with daughter with epilepsy - mental retardation   Retired     Former Smoker      Alcohol use-no          Occupation: Retired       son - Jaedah Lords     Medications:   Current Outpatient Medications on File Prior to Visit  Medication Sig Dispense Refill  . albuterol (PROAIR HFA) 108 (90 Base) MCG/ACT inhaler Inhale 2 puffs into the lungs every 6 (six) hours as needed for wheezing or shortness of breath. 18 g 2  . amLODipine (NORVASC) 10 MG tablet Take 1 tablet (10 mg total) by mouth daily.    Marland Kitchen aspirin EC 81 MG tablet Take 1 tablet (81 mg total) by mouth daily.    Marland Kitchen atorvastatin (LIPITOR) 20 MG tablet Take 1 tablet (20 mg total) by mouth daily. 90 tablet 1  . citalopram (CELEXA) 20 MG tablet Take 1 tablet (20 mg total) by mouth daily. 90 tablet 1  . clopidogrel (PLAVIX) 75 MG tablet Take 1 tablet (75 mg total) by mouth daily. 30 tablet 0  . hydrALAZINE (APRESOLINE) 25 MG tablet Take 1 tablet (25 mg total) by mouth every 8 (eight) hours.    . insulin glargine (LANTUS) 100 UNIT/ML injection Inject 0.15 mLs (15 Units total) into the skin daily. 1500 mL 1  . insulin lispro (HUMALOG KWIKPEN) 100 UNIT/ML KiwkPen Inject 0.05-0.1 mLs (5-10 Units total) into the skin 3 (three) times daily with meals. 15 mL 4  . ipratropium (ATROVENT HFA) 17 MCG/ACT inhaler Inhale 2 puffs into the lungs every 6 (six) hours as needed for wheezing. 1 Inhaler 12  . isosorbide mononitrate (IMDUR) 30 MG 24 hr tablet Take 1 tablet (30 mg total) by mouth at bedtime. (Patient taking differently: Take 30 mg by mouth daily. )  90 tablet 1  . nitroGLYCERIN (NITROSTAT) 0.4 MG SL tablet Place 0.4 mg under the tongue every 5 (five) minutes as needed for chest  pain.    . Omega-3 Fatty Acids (FISH OIL) 1200 MG CAPS Take 1,200 mg by mouth daily.     . pantoprazole (PROTONIX) 40 MG tablet Take 1 tablet (40 mg total) by mouth daily. 90 tablet 3  . ranitidine (ZANTAC) 300 MG tablet Take 150 mg by mouth at bedtime.     . thiamine (VITAMIN B-1) 100 MG tablet Take 1 tablet (100 mg total) by mouth daily. 30 tablet 5   No current facility-administered medications on file prior to visit.     Allergies:  No Known Allergies  Physical Exam General: well developed, well nourished elderly Caucasian lady, seated, in no evident distress Head: head normocephalic and atraumatic.  Neck: supple with no carotid or supraclavicular bruits Cardiovascular: regular rate and rhythm, no murmurs Musculoskeletal: no deformity Skin:  no rash/petichiae Vascular:  Normal pulses all extremities Vitals:   12/07/17 1103  BP: (!) 154/71  Pulse: (!) 56   Neurologic Exam Mental Status: Awake and fully alert. Oriented to place and time. Recent and remote memory poort. Attention span, concentration and fund of knowledge diminished Mood and affect appropriate. Recall 0/3. Able to name only 3animals with 4 legs. Clock drawing 3/4. Cranial Nerves: Fundoscopic exam reveals sharp disc margins. Pupils equal, briskly reactive to light. Extraocular movements full without nystagmus. Visual fields full to confrontation. Hearing intact. Facial sensation intact. Face, tongue, palate moves normally and symmetrically.  Motor: Normal bulk and tone. Normal strength in all tested extremity muscles. Sensory.: intact to touch ,pinprick .position and vibratory sensation.  Coordination: Rapid alternating movements normal in all extremities. Finger-to-nose and heel-to-shin performed accurately bilaterally. Gait and Station: Arises from chair without difficulty. Stance is  normal. Gait demonstrates normal stride length and balance . Able to heel, toe and tandem walk without difficulty.  Reflexes: 1+ and symmetric. Toes downgoing.      ASSESSMENT: 84 year old Caucasian lady with transient episode of speech disturbance and expressive aphasia unclear wether left hemispheric TIA vesrus Complex partial seizure.Multiple vascular risk factors of diabetes, hypertension, hyperlipidemia and age . Long-standing history of mild Alzheimer's dementia    PLAN: I had a long discussion with the patient's caregiver regarding her episode of transient confusion with speech difficulties being of unclear etiology possibly left hemispheric TIA versus complex partial seizure.  I recommend she continue aspirin and Plavix for 3 months followed by Plavix alone given her significant intracranial atherosclerotic disease and maintain aggressive risk factor modification.  Empirical trial of Keppra XR 500 mg daily for seizure prophylaxis given that she has had 2 episodes in less than a year which have been quite similar and on both occasions MRI has not shown an acute infarct.  She will continue to need 24-hour supervision at home given her underlying Alzheimer's dementia which appears to be stable.  I called and left a message on the patient's son Greg's answering machine to call me back to discuss her treatment plan.  She will return for follow-up in 3 months with my nurse practitioner Janett Billow or call earlier if necessary Greater than 50% of time during this 25 minute visit was spent on counseling,explanation of diagnosis, planning of further management, discussion with patient and family and coordination of care Antony Contras, MD  [facility] [address] [place], [place] [address]  Phone [phone] Fax [phone] Note: This document was prepared with digital dictation and possible smart phrase technology. Any transcriptional errors that result from this  process are unintentional

## 2017-12-07 NOTE — Patient Instructions (Signed)
I had a long discussion with the patient's caregiver regarding her episode of transient confusion with speech difficulties being of unclear etiology possibly left hemispheric TIA versus complex partial seizure.  I recommend she continue aspirin and Plavix for 3 months followed by Plavix alone given her significant intracranial atherosclerotic disease and maintain aggressive risk factor modification.  Empirical trial of Keppra XR 500 mg daily for seizure prophylaxis given that she has had 2 episodes in less than a year which have been quite similar and on both occasions MRI has not shown an acute infarct.  She will continue to need 24-hour supervision at home given her underlying Alzheimer's dementia which appears to be stable.  She will return for follow-up in 3 months with my nurse practitioner Janett Billow or call earlier if necessary

## 2017-12-08 DIAGNOSIS — R2689 Other abnormalities of gait and mobility: Secondary | ICD-10-CM | POA: Diagnosis not present

## 2017-12-08 DIAGNOSIS — G9340 Encephalopathy, unspecified: Secondary | ICD-10-CM | POA: Diagnosis not present

## 2017-12-08 DIAGNOSIS — I10 Essential (primary) hypertension: Secondary | ICD-10-CM | POA: Diagnosis not present

## 2017-12-08 DIAGNOSIS — E1169 Type 2 diabetes mellitus with other specified complication: Secondary | ICD-10-CM | POA: Diagnosis not present

## 2017-12-08 DIAGNOSIS — I480 Paroxysmal atrial fibrillation: Secondary | ICD-10-CM | POA: Diagnosis not present

## 2017-12-08 DIAGNOSIS — N184 Chronic kidney disease, stage 4 (severe): Secondary | ICD-10-CM | POA: Diagnosis not present

## 2017-12-09 ENCOUNTER — Ambulatory Visit: Payer: Medicare Other | Admitting: Podiatry

## 2017-12-09 ENCOUNTER — Ambulatory Visit (INDEPENDENT_AMBULATORY_CARE_PROVIDER_SITE_OTHER): Payer: Medicare Other | Admitting: Family Medicine

## 2017-12-09 DIAGNOSIS — E538 Deficiency of other specified B group vitamins: Secondary | ICD-10-CM

## 2017-12-09 DIAGNOSIS — Z8673 Personal history of transient ischemic attack (TIA), and cerebral infarction without residual deficits: Secondary | ICD-10-CM

## 2017-12-09 DIAGNOSIS — N184 Chronic kidney disease, stage 4 (severe): Secondary | ICD-10-CM

## 2017-12-09 DIAGNOSIS — E119 Type 2 diabetes mellitus without complications: Secondary | ICD-10-CM

## 2017-12-09 DIAGNOSIS — Z794 Long term (current) use of insulin: Secondary | ICD-10-CM

## 2017-12-09 DIAGNOSIS — I10 Essential (primary) hypertension: Secondary | ICD-10-CM | POA: Diagnosis not present

## 2017-12-09 DIAGNOSIS — E782 Mixed hyperlipidemia: Secondary | ICD-10-CM | POA: Diagnosis not present

## 2017-12-09 DIAGNOSIS — Z23 Encounter for immunization: Secondary | ICD-10-CM

## 2017-12-09 MED ORDER — HYDRALAZINE HCL 25 MG PO TABS: 25.0000 mg | ORAL_TABLET | Freq: Two times a day (BID) | ORAL | Status: DC

## 2017-12-09 MED ORDER — VITAMIN B-1 100 MG PO TABS: ORAL_TABLET | ORAL | 5 refills | Status: AC

## 2017-12-09 NOTE — Assessment & Plan Note (Signed)
Is following with Dr Leonie Man at [facility] and he has decided to try her on an empiric course of Keppra thinking these episodes she has had couple be atypical seizure activity. She is doing well at home after a course in [place] which she agrees has made her stronger. Will request records. They agree to pick up the [place] and try the medication

## 2017-12-09 NOTE — Assessment & Plan Note (Signed)
hgba1c acceptable, minimize simple carbs. Increase exercise as tolerated. Continue current meds 

## 2017-12-09 NOTE — Assessment & Plan Note (Addendum)
Is taking 3 x a week again, check level at next visit.

## 2017-12-09 NOTE — Assessment & Plan Note (Signed)
Hydrate and continue to monitor cmp

## 2017-12-09 NOTE — Assessment & Plan Note (Signed)
Encouraged heart healthy diet, increase exercise, avoid trans fats, consider a krill oil cap daily 

## 2017-12-09 NOTE — Progress Notes (Signed)
Subjective:    Patient ID: Destiny [name], female    DOB: 13-Oct-1931, 84 y.o.   MRN: [id number]  No chief complaint on file.   HPI Patient is in today for follow up after a hospital stay and some time in Rehab. She is feeling well today and is accompanied by family and an aide who accompanies her regularly. She is feeling stronger after rehab. She has seen by her neurologist, Dr Leonie Man who believes she may be experiencing seizure activity as opposed to TIAs and he has asked her to start a course of [place]. They are considering. No polyuria or polydipsia and her sugars have been good since she returned home. Denies CP/palp/SOB/HA/congestion/fevers/GI or GU c/o. Taking meds as prescribed  Past Medical History:  Diagnosis Date  . Acute renal insufficiency 12/24/2013  . Amaurosis fugax   . Arthritis 12/06/2012  . Atherosclerosis   . CAD (coronary artery disease)    a. s/p CABG x 4 (LIMA->LAD, VG->Diag, VG->OM1->OM3);  b. 2010 Cath: 3/4 patent grafts (VG->diag occluded), 3vd, sev PVD ->Med Rx;  c. 12/2011 Lexi MV: sm area of mild mid and apical ant wall ischemia ->med rx.  . Carotid bruit   . Chronic kidney disease (CKD) stage G4/A1, severely decreased glomerular filtration rate (GFR) between 15-29 mL/min/1.73 square meter and albuminuria creatinine ratio less than 30 mg/g (HCC)    GFR 25 on 12/25/13  . CVA (cerebral infarction)   . Dementia    pt denies  . Depression 12/24/2013  . Diabetes mellitus type II    insulin dependent.   . Diabetic peripheral neuropathy ([place])   . Diabetic retinopathy   . Diverticulosis   . Duodenal ulcer 2009 and 11/2011   caused GI bleeding  . Dyspnea   . Gallstones 2009   seen on CT scan  . GI bleed 04/2011, 11/2011   found non-bleeding duodenal ulcers  . Hyperlipidemia   . Hypersomnolence 03/12/2013  . Hypertension   . Iron deficiency anemia secondary to blood loss (chronic)   . Melena 1015/2015   PEPTIC ULCER    REQUIRING TRANSFUSION  . Pedal edema  10/12/2016  . PVD (peripheral vascular disease) ([place])   . Rib fracture 04/21/2015  . Stroke ([place])   . Thiamine deficiency 08/13/2015  . UTI (urinary tract infection) 04/08/2014  . Weight loss, non-intentional 01/29/2014    Past Surgical History:  Procedure Laterality Date  . ABDOMINAL HYSTERECTOMY    . COLONOSCOPY  05/20/2011   Procedure: COLONOSCOPY;  Surgeon: Scarlette Shorts, MD;  Location: [facility];  Service: Endoscopy;  Laterality: N/A;  . COLONOSCOPY WITH PROPOFOL N/A 06/09/2017   Procedure: COLONOSCOPY WITH PROPOFOL;  Surgeon: Milus Banister, MD;  Location: [facility] ENDOSCOPY;  Service: Endoscopy;  Laterality: N/A;  . CORONARY ARTERY BYPASS GRAFT  1999   x 4  . ENTEROSCOPY N/A 12/29/2013   Procedure: ENTEROSCOPY;  Surgeon: Inda Castle, MD;  Location: [facility];  Service: Endoscopy;  Laterality: N/A;  . ESOPHAGOGASTRODUODENOSCOPY  05/16/2011   Procedure: ESOPHAGOGASTRODUODENOSCOPY (EGD);  Surgeon: Gatha Mayer, MD;  Location: [facility] ENDOSCOPY;  Service: Endoscopy;  Laterality: N/A;  . ESOPHAGOGASTRODUODENOSCOPY  12/08/2011   Procedure: ESOPHAGOGASTRODUODENOSCOPY (EGD);  Surgeon: Ladene Artist, MD,FACG;  Location: [facility] ENDOSCOPY;  Service: Endoscopy;  Laterality: N/A;  . ESOPHAGOGASTRODUODENOSCOPY (EGD) WITH PROPOFOL N/A 06/08/2017   Procedure: ESOPHAGOGASTRODUODENOSCOPY (EGD) WITH PROPOFOL;  Surgeon: Milus Banister, MD;  Location: [facility] ENDOSCOPY;  Service: Endoscopy;  Laterality: N/A;  . UPPER GASTROINTESTINAL ENDOSCOPY  02/22/2008  w/biopsy, duedenal ulcer, antrum erosions    Family History  Problem Relation Age of Onset  . Coronary artery disease Father   . Hypertension Father   . Stroke Father   . Cancer Mother        ?  . Diabetes Maternal Uncle   . Stroke Sister   . Colon cancer Neg Hx     Social History   Socioeconomic History  . Marital status: Widowed    Spouse name: Not on file  . Number of children: 4  . Years of education: Not on file  . Highest education level:  Not on file  Occupational History  . Occupation: retired    Fish farm manager: RETIRED  Social Needs  . Financial resource strain: Not on file  . Food insecurity:    Worry: Not on file    Inability: Not on file  . Transportation needs:    Medical: Not on file    Non-medical: Not on file  Tobacco Use  . Smoking status: Former Smoker    Types: Cigarettes    Last attempt to quit: 12/31/2000    Years since quitting: 16.9  . Smokeless tobacco: Never Used  Substance and Sexual Activity  . Alcohol use: No  . Drug use: No  . Sexual activity: Not on file  Lifestyle  . Physical activity:    Days per week: Not on file    Minutes per session: Not on file  . Stress: Not on file  Relationships  . Social connections:    Talks on phone: Not on file    Gets together: Not on file    Attends religious service: Not on file    Active member of club or organization: Not on file    Attends meetings of clubs or organizations: Not on file    Relationship status: Not on file  . Intimate partner violence:    Fear of current or ex partner: Not on file    Emotionally abused: Not on file    Physically abused: Not on file    Forced sexual activity: Not on file  Other Topics Concern  . Not on file  Social History Narrative   Widowed - husband passed away in June 02, 2009   lives with daughter with epilepsy - mental retardation   Retired     Former Smoker      Alcohol use-no          Occupation: Retired       son - Shantese Raven     Outpatient Medications Prior to Visit  Medication Sig Dispense Refill  . albuterol (PROAIR HFA) 108 (90 Base) MCG/ACT inhaler Inhale 2 puffs into the lungs every 6 (six) hours as needed for wheezing or shortness of breath. 18 g 2  . amLODipine (NORVASC) 10 MG tablet Take 1 tablet (10 mg total) by mouth daily.    Marland Kitchen aspirin EC 81 MG tablet Take 1 tablet (81 mg total) by mouth daily.    Marland Kitchen atorvastatin (LIPITOR) 20 MG tablet Take 1 tablet (20 mg total) by mouth daily. 90 tablet 1    . citalopram (CELEXA) 20 MG tablet Take 1 tablet (20 mg total) by mouth daily. 90 tablet 1  . clopidogrel (PLAVIX) 75 MG tablet Take 1 tablet (75 mg total) by mouth daily. 30 tablet 0  . insulin glargine (LANTUS) 100 UNIT/ML injection Inject 0.15 mLs (15 Units total) into the skin daily. 1500 mL 1  . insulin lispro (HUMALOG KWIKPEN) 100 UNIT/ML KiwkPen  Inject 0.05-0.1 mLs (5-10 Units total) into the skin 3 (three) times daily with meals. 15 mL 4  . ipratropium (ATROVENT HFA) 17 MCG/ACT inhaler Inhale 2 puffs into the lungs every 6 (six) hours as needed for wheezing. 1 Inhaler 12  . isosorbide mononitrate (IMDUR) 30 MG 24 hr tablet Take 1 tablet (30 mg total) by mouth at bedtime. (Patient taking differently: Take 30 mg by mouth daily. ) 90 tablet 1  . levETIRAcetam (KEPPRA XR) 500 MG 24 hr tablet Take 1 tablet (500 mg total) by mouth daily. 30 tablet 1  . nitroGLYCERIN (NITROSTAT) 0.4 MG SL tablet Place 0.4 mg under the tongue every 5 (five) minutes as needed for chest pain.    . Omega-3 Fatty Acids (FISH OIL) 1200 MG CAPS Take 1,200 mg by mouth daily.     . pantoprazole (PROTONIX) 40 MG tablet Take 1 tablet (40 mg total) by mouth daily. 90 tablet 3  . ranitidine (ZANTAC) 300 MG tablet Take 150 mg by mouth at bedtime.     . hydrALAZINE (APRESOLINE) 25 MG tablet Take 1 tablet (25 mg total) by mouth every 8 (eight) hours.    . thiamine (VITAMIN B-1) 100 MG tablet Take 1 tablet (100 mg total) by mouth daily. 30 tablet 5   No facility-administered medications prior to visit.     No Known Allergies  Review of Systems  Constitutional: Positive for malaise/fatigue. Negative for fever.  HENT: Negative for congestion.   Eyes: Negative for blurred vision.  Respiratory: Negative for shortness of breath.   Cardiovascular: Negative for chest pain, palpitations and leg swelling.  Gastrointestinal: Negative for abdominal pain, blood in stool and nausea.  Genitourinary: Negative for dysuria and frequency.   Musculoskeletal: Negative for falls.  Skin: Negative for rash.  Neurological: Negative for dizziness, loss of consciousness and headaches.  Endo/Heme/Allergies: Negative for environmental allergies.  Psychiatric/Behavioral: Negative for depression. The patient is not nervous/anxious.        Objective:    Physical Exam  Constitutional: She is oriented to person, place, and time. She appears well-developed and well-nourished. No distress.  HENT:  Head: Normocephalic and atraumatic.  Nose: Nose normal.  Eyes: Right eye exhibits no discharge. Left eye exhibits no discharge.  Neck: Normal range of motion. Neck supple.  Cardiovascular: Normal rate and regular rhythm.  Murmur heard. Pulmonary/Chest: Effort normal and breath sounds normal.  Abdominal: Soft. Bowel sounds are normal. There is no tenderness.  Musculoskeletal: She exhibits no edema.  Neurological: She is alert and oriented to person, place, and time.  Skin: Skin is warm and dry.  Psychiatric: She has a normal mood and affect.  Nursing note and vitals reviewed.   There were no vitals taken for this visit. Wt Readings from Last 3 Encounters:  12/07/17 152 lb (68.9 kg)  11/05/17 157 lb 13.6 oz (71.6 kg)  10/28/17 153 lb (69.4 kg)     Lab Results  Component Value Date   WBC 8.2 11/02/2017   HGB 10.9 (L) 11/02/2017   HCT 33.6 (L) 11/02/2017   PLT 189 11/02/2017   GLUCOSE 96 11/02/2017   CHOL 182 09/28/2017   TRIG 196.0 (H) 09/28/2017   HDL 42.70 09/28/2017   LDLDIRECT 128.0 01/26/2017   LDLCALC 100 (H) 09/28/2017   ALT 13 10/31/2017   AST 16 10/31/2017   NA 139 11/02/2017   K 3.6 11/02/2017   CL 109 11/02/2017   CREATININE 1.90 (H) 11/02/2017   BUN 21 11/02/2017   CO2 23  11/02/2017   TSH 1.328 10/31/2017   INR 1.09 10/31/2017   HGBA1C 7.5 (H) 09/28/2017   MICROALBUR 97.4 (H) 11/26/2015    Lab Results  Component Value Date   TSH 1.328 10/31/2017   Lab Results  Component Value Date   WBC 8.2  11/02/2017   HGB 10.9 (L) 11/02/2017   HCT 33.6 (L) 11/02/2017   MCV 92.1 11/02/2017   PLT 189 11/02/2017   Lab Results  Component Value Date   NA 139 11/02/2017   K 3.6 11/02/2017   CO2 23 11/02/2017   GLUCOSE 96 11/02/2017   BUN 21 11/02/2017   CREATININE 1.90 (H) 11/02/2017   BILITOT 1.3 (H) 10/31/2017   ALKPHOS 64 10/31/2017   AST 16 10/31/2017   ALT 13 10/31/2017   PROT 6.6 10/31/2017   ALBUMIN 3.3 (L) 10/31/2017   CALCIUM 8.7 (L) 11/02/2017   ANIONGAP 7 11/02/2017   GFR 27.74 (L) 10/28/2017   Lab Results  Component Value Date   CHOL 182 09/28/2017   Lab Results  Component Value Date   HDL 42.70 09/28/2017   Lab Results  Component Value Date   LDLCALC 100 (H) 09/28/2017   Lab Results  Component Value Date   TRIG 196.0 (H) 09/28/2017   Lab Results  Component Value Date   CHOLHDL 4 09/28/2017   Lab Results  Component Value Date   HGBA1C 7.5 (H) 09/28/2017       Assessment & Plan:   Problem List Items Addressed This Visit    Essential hypertension (Chronic)    Well controlled, no changes to meds. Encouraged heart healthy diet such as the DASH diet and exercise as tolerated.       Relevant Medications   hydrALAZINE (APRESOLINE) 25 MG tablet   Diabetes mellitus type 2, insulin dependent (HCC) (Chronic)    hgba1c acceptable, minimize simple carbs. Increase exercise as tolerated. Continue current meds      CKD (chronic kidney disease) stage 4, GFR 15-29 ml/min (HCC) (Chronic)    Hydrate and continue to monitor cmp      History of CVA (cerebrovascular accident) (Chronic)    Is following with Dr Leonie Man at [facility] and he has decided to try her on an empiric course of Keppra thinking these episodes she has had couple be atypical seizure activity. She is doing well at home after a course in [place] which she agrees has made her stronger. Will request records. They agree to pick up the [place] and try the medication      Hyperlipidemia    Encouraged heart  healthy diet, increase exercise, avoid trans fats, consider a krill oil cap daily      Relevant Medications   hydrALAZINE (APRESOLINE) 25 MG tablet   B12 deficiency    Is taking 3 x a week again, check level at next visit.          I have changed Destiny [name]'s hydrALAZINE and thiamine. I am also having her maintain her Fish Oil, nitroGLYCERIN, insulin lispro, atorvastatin, citalopram, ipratropium, albuterol, isosorbide mononitrate, aspirin EC, ranitidine, pantoprazole, insulin glargine, amLODipine, clopidogrel, and levETIRAcetam.  Meds ordered this encounter  Medications  . hydrALAZINE (APRESOLINE) 25 MG tablet    Sig: Take 1 tablet (25 mg total) by mouth 2 (two) times daily.  Marland Kitchen thiamine (VITAMIN B-1) 100 MG tablet    Sig: 1 tab po M, W, F    Dispense:  30 tablet    Refill:  [place],  MD

## 2017-12-09 NOTE — Assessment & Plan Note (Signed)
Well controlled, no changes to meds. Encouraged heart healthy diet such as the DASH diet and exercise as tolerated.  °

## 2017-12-09 NOTE — Patient Instructions (Addendum)
Encouraged increased hydration and fiber in diet. Daily probiotics. If bowels not moving can use MOM 2 tbls po in 4 oz of warm prune juice by mouth every 2-3 days. If no results then repeat in 4 hours with  Dulcolax suppository pr, may repeat again in 4 more hours as needed. Seek care if symptoms worsen. Consider daily Miralax and/or Dulcolax if symptoms persist.   Shingrix is the new shingles shot, 2 shots over 2-6 months Carbohydrate Counting for Diabetes Mellitus, Adult Carbohydrate counting is a method for keeping track of how many carbohydrates you eat. Eating carbohydrates naturally increases the amount of sugar (glucose) in the blood. Counting how many carbohydrates you eat helps keep your blood glucose within normal limits, which helps you manage your diabetes (diabetes mellitus). It is important to know how many carbohydrates you can safely have in each meal. This is different for every person. A diet and nutrition specialist (registered dietitian) can help you make a meal plan and calculate how many carbohydrates you should have at each meal and snack. Carbohydrates are found in the following foods:  Grains, such as breads and cereals.  Dried beans and soy products.  Starchy vegetables, such as potatoes, peas, and corn.  Fruit and fruit juices.  Milk and yogurt.  Sweets and snack foods, such as cake, cookies, candy, chips, and soft drinks.  How do I count carbohydrates? There are two ways to count carbohydrates in food. You can use either of the methods or a combination of both. Reading "Nutrition Facts" on packaged food The "Nutrition Facts" list is included on the labels of almost all packaged foods and beverages in the [place] It includes:  The serving size.  Information about nutrients in each serving, including the grams (g) of carbohydrate per serving.  To use the "Nutrition Facts":  Decide how many servings you will have.  Multiply the number of servings by the number  of carbohydrates per serving.  The resulting number is the total amount of carbohydrates that you will be having.  Learning standard serving sizes of other foods When you eat foods containing carbohydrates that are not packaged or do not include "Nutrition Facts" on the label, you need to measure the servings in order to count the amount of carbohydrates:  Measure the foods that you will eat with a food scale or measuring cup, if needed.  Decide how many standard-size servings you will eat.  Multiply the number of servings by 15. Most carbohydrate-rich foods have about 15 g of carbohydrates per serving. ? For example, if you eat 8 oz (170 g) of strawberries, you will have eaten 2 servings and 30 g of carbohydrates (2 servings x 15 g = 30 g).  For foods that have more than one food mixed, such as soups and casseroles, you must count the carbohydrates in each food that is included.  The following list contains standard serving sizes of common carbohydrate-rich foods. Each of these servings has about 15 g of carbohydrates:   hamburger bun or  English muffin.   oz (15 mL) syrup.   oz (14 g) jelly.  1 slice of bread.  1 six-inch tortilla.  3 oz (85 g) cooked rice or pasta.  4 oz (113 g) cooked dried beans.  4 oz (113 g) starchy vegetable, such as peas, corn, or potatoes.  4 oz (113 g) hot cereal.  4 oz (113 g) mashed potatoes or  of a large baked potato.  4 oz (113 g) canned  or frozen fruit.  4 oz (120 mL) fruit juice.  4-6 crackers.  6 chicken nuggets.  6 oz (170 g) unsweetened dry cereal.  6 oz (170 g) plain fat-free yogurt or yogurt sweetened with artificial sweeteners.  8 oz (240 mL) milk.  8 oz (170 g) fresh fruit or one small piece of fruit.  24 oz (680 g) popped popcorn.  Example of carbohydrate counting Sample meal  3 oz (85 g) chicken breast.  6 oz (170 g) brown rice.  4 oz (113 g) corn.  8 oz (240 mL) milk.  8 oz (170 g) strawberries with  sugar-free whipped topping. Carbohydrate calculation 1. Identify the foods that contain carbohydrates: ? Rice. ? Corn. ? Milk. ? Strawberries. 2. Calculate how many servings you have of each food: ? 2 servings rice. ? 1 serving corn. ? 1 serving milk. ? 1 serving strawberries. 3. Multiply each number of servings by 15 g: ? 2 servings rice x 15 g = 30 g. ? 1 serving corn x 15 g = 15 g. ? 1 serving milk x 15 g = 15 g. ? 1 serving strawberries x 15 g = 15 g. 4. Add together all of the amounts to find the total grams of carbohydrates eaten: ? 30 g + 15 g + 15 g + 15 g = 75 g of carbohydrates total. This information is not intended to replace advice given to you by your health care provider. Make sure you discuss any questions you have with your health care provider. Document Released: 03/02/2005 Document Revised: 09/20/2015 Document Reviewed: 08/14/2015 Elsevier Interactive Patient Education  [employer].

## 2017-12-10 DIAGNOSIS — R2689 Other abnormalities of gait and mobility: Secondary | ICD-10-CM | POA: Diagnosis not present

## 2017-12-10 DIAGNOSIS — E1169 Type 2 diabetes mellitus with other specified complication: Secondary | ICD-10-CM | POA: Diagnosis not present

## 2017-12-10 DIAGNOSIS — I10 Essential (primary) hypertension: Secondary | ICD-10-CM | POA: Diagnosis not present

## 2017-12-10 DIAGNOSIS — N184 Chronic kidney disease, stage 4 (severe): Secondary | ICD-10-CM | POA: Diagnosis not present

## 2017-12-10 DIAGNOSIS — I480 Paroxysmal atrial fibrillation: Secondary | ICD-10-CM | POA: Diagnosis not present

## 2017-12-10 DIAGNOSIS — G9340 Encephalopathy, unspecified: Secondary | ICD-10-CM | POA: Diagnosis not present

## 2017-12-13 ENCOUNTER — Other Ambulatory Visit: Payer: Self-pay | Admitting: Family Medicine

## 2017-12-13 ENCOUNTER — Telehealth: Payer: Self-pay | Admitting: Family Medicine

## 2017-12-13 MED FILL — ATORVASTATIN CALCIUM 20 MG: 20 | 90 days supply | Qty: 90 | Fill #0

## 2017-12-13 NOTE — Telephone Encounter (Signed)
Copied from [place] [phone]. Topic: Quick Communication - See Telephone Encounter >> Dec 13, 2017  2:52 PM Robina Ade, Helene Kelp D wrote: CRM for notification. See Telephone encounter for: 12/13/17. Flor with [facility] would like to request an order as follow: She would like PT 1X a week for 3 weeks. Her call back number is [phone].

## 2017-12-13 NOTE — Telephone Encounter (Signed)
Verbal orders given  

## 2017-12-15 DIAGNOSIS — R2689 Other abnormalities of gait and mobility: Secondary | ICD-10-CM | POA: Diagnosis not present

## 2017-12-15 DIAGNOSIS — G9340 Encephalopathy, unspecified: Secondary | ICD-10-CM | POA: Diagnosis not present

## 2017-12-15 DIAGNOSIS — N184 Chronic kidney disease, stage 4 (severe): Secondary | ICD-10-CM | POA: Diagnosis not present

## 2017-12-15 DIAGNOSIS — E1169 Type 2 diabetes mellitus with other specified complication: Secondary | ICD-10-CM | POA: Diagnosis not present

## 2017-12-15 DIAGNOSIS — I480 Paroxysmal atrial fibrillation: Secondary | ICD-10-CM | POA: Diagnosis not present

## 2017-12-15 DIAGNOSIS — I10 Essential (primary) hypertension: Secondary | ICD-10-CM | POA: Diagnosis not present

## 2017-12-16 ENCOUNTER — Telehealth: Payer: Self-pay | Admitting: Family Medicine

## 2017-12-16 DIAGNOSIS — N184 Chronic kidney disease, stage 4 (severe): Secondary | ICD-10-CM | POA: Diagnosis not present

## 2017-12-16 DIAGNOSIS — I10 Essential (primary) hypertension: Secondary | ICD-10-CM | POA: Diagnosis not present

## 2017-12-16 DIAGNOSIS — R2689 Other abnormalities of gait and mobility: Secondary | ICD-10-CM | POA: Diagnosis not present

## 2017-12-16 DIAGNOSIS — G9340 Encephalopathy, unspecified: Secondary | ICD-10-CM | POA: Diagnosis not present

## 2017-12-16 DIAGNOSIS — I480 Paroxysmal atrial fibrillation: Secondary | ICD-10-CM | POA: Diagnosis not present

## 2017-12-16 DIAGNOSIS — E1169 Type 2 diabetes mellitus with other specified complication: Secondary | ICD-10-CM | POA: Diagnosis not present

## 2017-12-16 NOTE — Telephone Encounter (Signed)
Copied from [place] [id number]. Topic: General - Other >> Dec 16, 2017  2:27 PM Lennox Solders wrote: Reason for CRM: alden SP with [facility] is calling and would like verbal order for this patient to have speech therapy once a wk for 2 wks and then twice a wk for 4 wks

## 2017-12-16 NOTE — Telephone Encounter (Signed)
Spoke w/ Anna Genre, verbal orders given.

## 2017-12-20 ENCOUNTER — Other Ambulatory Visit: Payer: Self-pay | Admitting: Family Medicine

## 2017-12-20 DIAGNOSIS — F418 Other specified anxiety disorders: Secondary | ICD-10-CM

## 2017-12-20 MED FILL — CITALOPRAM HBR 20 MG TABLET: 20 | 90 days supply | Qty: 90 | Fill #0

## 2017-12-21 DIAGNOSIS — E1169 Type 2 diabetes mellitus with other specified complication: Secondary | ICD-10-CM | POA: Diagnosis not present

## 2017-12-21 DIAGNOSIS — I480 Paroxysmal atrial fibrillation: Secondary | ICD-10-CM | POA: Diagnosis not present

## 2017-12-21 DIAGNOSIS — G9340 Encephalopathy, unspecified: Secondary | ICD-10-CM | POA: Diagnosis not present

## 2017-12-21 DIAGNOSIS — N184 Chronic kidney disease, stage 4 (severe): Secondary | ICD-10-CM | POA: Diagnosis not present

## 2017-12-21 DIAGNOSIS — I10 Essential (primary) hypertension: Secondary | ICD-10-CM | POA: Diagnosis not present

## 2017-12-21 DIAGNOSIS — R2689 Other abnormalities of gait and mobility: Secondary | ICD-10-CM | POA: Diagnosis not present

## 2017-12-27 DIAGNOSIS — N184 Chronic kidney disease, stage 4 (severe): Secondary | ICD-10-CM | POA: Diagnosis not present

## 2017-12-27 DIAGNOSIS — Z794 Long term (current) use of insulin: Secondary | ICD-10-CM | POA: Diagnosis not present

## 2017-12-27 DIAGNOSIS — I130 Hypertensive heart and chronic kidney disease with heart failure and stage 1 through stage 4 chronic kidney disease, or unspecified chronic kidney disease: Secondary | ICD-10-CM | POA: Diagnosis not present

## 2017-12-27 DIAGNOSIS — Z87440 Personal history of urinary (tract) infections: Secondary | ICD-10-CM | POA: Diagnosis not present

## 2017-12-27 DIAGNOSIS — I700 Atherosclerosis of aorta: Secondary | ICD-10-CM | POA: Diagnosis not present

## 2017-12-27 DIAGNOSIS — E1122 Type 2 diabetes mellitus with diabetic chronic kidney disease: Secondary | ICD-10-CM | POA: Diagnosis not present

## 2017-12-27 DIAGNOSIS — J449 Chronic obstructive pulmonary disease, unspecified: Secondary | ICD-10-CM | POA: Diagnosis not present

## 2017-12-27 DIAGNOSIS — I2510 Atherosclerotic heart disease of native coronary artery without angina pectoris: Secondary | ICD-10-CM | POA: Diagnosis not present

## 2017-12-27 DIAGNOSIS — E1151 Type 2 diabetes mellitus with diabetic peripheral angiopathy without gangrene: Secondary | ICD-10-CM | POA: Diagnosis not present

## 2017-12-27 DIAGNOSIS — Z8673 Personal history of transient ischemic attack (TIA), and cerebral infarction without residual deficits: Secondary | ICD-10-CM | POA: Diagnosis not present

## 2017-12-27 DIAGNOSIS — Z87891 Personal history of nicotine dependence: Secondary | ICD-10-CM | POA: Diagnosis not present

## 2017-12-27 DIAGNOSIS — I5032 Chronic diastolic (congestive) heart failure: Secondary | ICD-10-CM | POA: Diagnosis not present

## 2017-12-27 DIAGNOSIS — I480 Paroxysmal atrial fibrillation: Secondary | ICD-10-CM | POA: Diagnosis not present

## 2017-12-27 MED FILL — CLOPIDOGREL 75 MG TABLET: 75 | 30 days supply | Qty: 30 | Fill #0

## 2017-12-27 MED FILL — ACCU-CHEK GUIDE W/DEVICE KI: W/DEVICE | 30 days supply | Qty: 1 | Fill #0

## 2017-12-27 MED FILL — ACCU-CHEK FASTCLIX LANCETS: 26 days supply | Qty: 102 | Fill #0

## 2017-12-27 MED FILL — ACCU-CHEK GUIDE TEST STRIP: 25 days supply | Qty: 100 | Fill #0

## 2017-12-28 ENCOUNTER — Ambulatory Visit (INDEPENDENT_AMBULATORY_CARE_PROVIDER_SITE_OTHER): Payer: Medicare Other | Admitting: Family Medicine

## 2017-12-28 DIAGNOSIS — E538 Deficiency of other specified B group vitamins: Secondary | ICD-10-CM

## 2017-12-28 DIAGNOSIS — I10 Essential (primary) hypertension: Secondary | ICD-10-CM | POA: Diagnosis not present

## 2017-12-28 DIAGNOSIS — E782 Mixed hyperlipidemia: Secondary | ICD-10-CM

## 2017-12-28 DIAGNOSIS — R634 Abnormal weight loss: Secondary | ICD-10-CM

## 2017-12-28 DIAGNOSIS — E1140 Type 2 diabetes mellitus with diabetic neuropathy, unspecified: Secondary | ICD-10-CM | POA: Diagnosis not present

## 2017-12-28 MED ORDER — FAMOTIDINE 40 MG PO TABS: 40.0000 mg | ORAL_TABLET | Freq: Every day | ORAL | 1 refills | Status: DC

## 2017-12-28 MED FILL — FAMOTIDINE 40 MG TABLET: 40 | 90 days supply | Qty: 90 | Fill #0

## 2017-12-28 NOTE — Assessment & Plan Note (Signed)
Well controlled, no changes to meds. Encouraged heart healthy diet such as the DASH diet and exercise as tolerated.  °

## 2017-12-28 NOTE — Addendum Note (Signed)
Addended by: Harl Bowie on: 12/28/2017 04:43 PM   Modules accepted: Orders

## 2017-12-28 NOTE — Assessment & Plan Note (Signed)
hgba1c acceptable, minimize simple carbs. Increase exercise as tolerated. Continue current meds 

## 2017-12-28 NOTE — Assessment & Plan Note (Signed)
Has not been trying but does not routinely get hungry. Discussed strategies for increasing caloric intake and protein intake will reassess at next visit and family will monitor and let us know if worsens

## 2017-12-28 NOTE — Assessment & Plan Note (Signed)
Tolerating statin, encouraged heart healthy diet, avoid trans fats, minimize simple carbs and saturated fats. Increase exercise as tolerated 

## 2017-12-28 NOTE — Progress Notes (Signed)
Subjective:    Patient ID: [name], female    DOB: 1931/04/05, 84 y.o.   MRN: [id number]  No chief complaint on file.   HPI Patient is in today for follow-up accompanied by her personal care aide and her son.  She has moved in with her son since her recent hospitalization and they feel that is going well.  She offers no acute complaints today.  They know her sugars have been improved fasting sugars running between 90 and 100 most days.  Lantus is down to 10 units daily.  They do also note though that her appetite has been low and she has lost some weight.  She denies any acute complaints that could cause this.  They also note they have only been giving her the hydralazine once daily for unclear reasons and they do not recall any side effects from the medication.  No polyuria or polydipsia.  No recent febrile illness noted. Denies CP/palp/SOB/HA/congestion/fevers/GI or GU c/o. Taking meds as prescribed  Past Medical History:  Diagnosis Date  . Acute renal insufficiency 12/24/2013  . Amaurosis fugax   . Arthritis 12/06/2012  . Atherosclerosis   . CAD (coronary artery disease)    a. s/p CABG x 4 (LIMA->LAD, VG->Diag, VG->OM1->OM3);  b. 2010 Cath: 3/4 patent grafts (VG->diag occluded), 3vd, sev PVD ->Med Rx;  c. 12/2011 Lexi MV: sm area of mild mid and apical ant wall ischemia ->med rx.  . Carotid bruit   . Chronic kidney disease (CKD) stage G4/A1, severely decreased glomerular filtration rate (GFR) between 15-29 mL/min/1.73 square meter and albuminuria creatinine ratio less than 30 mg/g (HCC)    GFR 25 on 12/25/13  . CVA (cerebral infarction)   . Dementia    pt denies  . Depression 12/24/2013  . Diabetes mellitus type II    insulin dependent.   . Diabetic peripheral neuropathy ([place])   . Diabetic retinopathy   . Diverticulosis   . Duodenal ulcer 2009 and 11/2011   caused GI bleeding  . Dyspnea   . Gallstones 2009   seen on CT scan  . GI bleed 04/2011, 11/2011   found  non-bleeding duodenal ulcers  . Hyperlipidemia   . Hypersomnolence 03/12/2013  . Hypertension   . Iron deficiency anemia secondary to blood loss (chronic)   . Melena 1015/2015   PEPTIC ULCER    REQUIRING TRANSFUSION  . Pedal edema 10/12/2016  . PVD (peripheral vascular disease) ([place])   . Rib fracture 04/21/2015  . Stroke ([place])   . Thiamine deficiency 08/13/2015  . UTI (urinary tract infection) 04/08/2014  . Weight loss, non-intentional 01/29/2014    Past Surgical History:  Procedure Laterality Date  . ABDOMINAL HYSTERECTOMY    . COLONOSCOPY  05/20/2011   Procedure: COLONOSCOPY;  Surgeon: Scarlette Shorts, MD;  Location: [place];  Service: Endoscopy;  Laterality: N/A;  . COLONOSCOPY WITH PROPOFOL N/A 06/09/2017   Procedure: COLONOSCOPY WITH PROPOFOL;  Surgeon: Milus Banister, MD;  Location: [facility];  Service: Endoscopy;  Laterality: N/A;  . CORONARY ARTERY BYPASS GRAFT  1999   x 4  . ENTEROSCOPY N/A 12/29/2013   Procedure: ENTEROSCOPY;  Surgeon: Inda Castle, MD;  Location: [place];  Service: Endoscopy;  Laterality: N/A;  . ESOPHAGOGASTRODUODENOSCOPY  05/16/2011   Procedure: ESOPHAGOGASTRODUODENOSCOPY (EGD);  Surgeon: Gatha Mayer, MD;  [facility] ENDOSCOPY;  Service: Endoscopy;  Laterality: N/A;  . ESOPHAGOGASTRODUODENOSCOPY  12/08/2011   Procedure: ESOPHAGOGASTRODUODENOSCOPY (EGD);  Surgeon: Ladene Artist, MD,FACG;  Location: [facility]  ENDOSCOPY;  Service: Endoscopy;  Laterality: N/A;  . ESOPHAGOGASTRODUODENOSCOPY (EGD) WITH PROPOFOL N/A 06/08/2017   Procedure: ESOPHAGOGASTRODUODENOSCOPY (EGD) WITH PROPOFOL;  Surgeon: Milus Banister, MD;  Location: [facility] ENDOSCOPY;  Service: Endoscopy;  Laterality: N/A;  . UPPER GASTROINTESTINAL ENDOSCOPY  02/22/2008   w/biopsy, duedenal ulcer, antrum erosions    Family History  Problem Relation Age of Onset  . Coronary artery disease Father   . Hypertension Father   . Stroke Father   . Cancer Mother        ?  . Diabetes Maternal Uncle     . Stroke Sister   . Colon cancer Neg Hx     Social History   Socioeconomic History  . Marital status: Widowed    Spouse name: Not on file  . Number of children: 4  . Years of education: Not on file  . Highest education level: Not on file  Occupational History  . Occupation: retired    Fish farm manager: RETIRED  Social Needs  . Financial resource strain: Not on file  . Food insecurity:    Worry: Not on file    Inability: Not on file  . Transportation needs:    Medical: Not on file    Non-medical: Not on file  Tobacco Use  . Smoking status: Former Smoker    Types: Cigarettes    Last attempt to quit: 12/31/2000    Years since quitting: 17.0  . Smokeless tobacco: Never Used  Substance and Sexual Activity  . Alcohol use: No  . Drug use: No  . Sexual activity: Not on file  Lifestyle  . Physical activity:    Days per week: Not on file    Minutes per session: Not on file  . Stress: Not on file  Relationships  . Social connections:    Talks on phone: Not on file    Gets together: Not on file    Attends religious service: Not on file    Active member of club or organization: Not on file    Attends meetings of clubs or organizations: Not on file    Relationship status: Not on file  . Intimate partner violence:    Fear of current or ex partner: Not on file    Emotionally abused: Not on file    Physically abused: Not on file    Forced sexual activity: Not on file  Other Topics Concern  . Not on file  Social History Narrative   Widowed - husband passed away in June 02, 2009   lives with daughter with epilepsy - mental retardation   Retired     Former Smoker      Alcohol use-no          Occupation: Retired       son - [employer]     Outpatient Medications Prior to Visit  Medication Sig Dispense Refill  . albuterol (PROAIR HFA) 108 (90 Base) MCG/ACT inhaler Inhale 2 puffs into the lungs every 6 (six) hours as needed for wheezing or shortness of breath. 18 g 2  . amLODipine  (NORVASC) 10 MG tablet Take 1 tablet (10 mg total) by mouth daily.    Marland Kitchen aspirin EC 81 MG tablet Take 1 tablet (81 mg total) by mouth daily.    Marland Kitchen atorvastatin (LIPITOR) 20 MG tablet TAKE 1 TABLET (20 MG TOTAL) BY MOUTH DAILY. 90 tablet 1  . citalopram (CELEXA) 20 MG tablet TAKE 1 TABLET (20 MG TOTAL) BY MOUTH DAILY. 90 tablet 1  .  clopidogrel (PLAVIX) 75 MG tablet Take 1 tablet (75 mg total) by mouth daily. 30 tablet 0  . hydrALAZINE (APRESOLINE) 25 MG tablet Take 1 tablet (25 mg total) by mouth 2 (two) times daily.    . insulin glargine (LANTUS) 100 UNIT/ML injection Inject 0.15 mLs (15 Units total) into the skin daily. 1500 mL 1  . insulin lispro (HUMALOG KWIKPEN) 100 UNIT/ML KiwkPen Inject 0.05-0.1 mLs (5-10 Units total) into the skin 3 (three) times daily with meals. 15 mL 4  . ipratropium (ATROVENT HFA) 17 MCG/ACT inhaler Inhale 2 puffs into the lungs every 6 (six) hours as needed for wheezing. 1 Inhaler 12  . isosorbide mononitrate (IMDUR) 30 MG 24 hr tablet Take 1 tablet (30 mg total) by mouth at bedtime. (Patient taking differently: Take 30 mg by mouth daily. ) 90 tablet 1  . levETIRAcetam (KEPPRA XR) 500 MG 24 hr tablet Take 1 tablet (500 mg total) by mouth daily. 30 tablet 1  . nitroGLYCERIN (NITROSTAT) 0.4 MG SL tablet Place 0.4 mg under the tongue every 5 (five) minutes as needed for chest pain.    . Omega-3 Fatty Acids (FISH OIL) 1200 MG CAPS Take 1,200 mg by mouth daily.     . pantoprazole (PROTONIX) 40 MG tablet Take 1 tablet (40 mg total) by mouth daily. 90 tablet 3  . thiamine (VITAMIN B-1) 100 MG tablet 1 tab po M, W, F 30 tablet 5  . ranitidine (ZANTAC) 300 MG tablet Take 150 mg by mouth at bedtime.      No facility-administered medications prior to visit.     No Known Allergies  Review of Systems  Constitutional: Positive for weight loss. Negative for fever and malaise/fatigue.  HENT: Negative for congestion.   Eyes: Negative for blurred vision.  Respiratory: Negative for  shortness of breath.   Cardiovascular: Negative for chest pain, palpitations and leg swelling.  Gastrointestinal: Negative for abdominal pain, blood in stool and nausea.  Genitourinary: Negative for dysuria and frequency.  Musculoskeletal: Positive for myalgias. Negative for falls.  Skin: Negative for rash.  Neurological: Negative for dizziness, loss of consciousness and headaches.  Endo/Heme/Allergies: Negative for environmental allergies.  Psychiatric/Behavioral: Positive for memory loss. Negative for depression. The patient is not nervous/anxious.        Objective:    Physical Exam  Constitutional: She is oriented to person, place, and time. She appears well-developed and well-nourished. No distress.  HENT:  Head: Normocephalic and atraumatic.  Nose: Nose normal.  Eyes: Right eye exhibits no discharge. Left eye exhibits no discharge.  Neck: Normal range of motion. Neck supple.  Cardiovascular: Normal rate and regular rhythm.  Pulmonary/Chest: Effort normal and breath sounds normal.  Abdominal: Soft. Bowel sounds are normal. There is no tenderness.  Musculoskeletal: She exhibits no edema.  Neurological: She is alert and oriented to person, place, and time.  Skin: Skin is warm and dry.  Psychiatric: She has a normal mood and affect.  Nursing note and vitals reviewed.   BP (!) 142/70 (BP Location: Left Arm, Patient Position: Sitting, Cuff Size: Normal)   Pulse (!) 54   Temp 97.7 F (36.5 C) (Oral)   Resp 18   Wt 142 lb (64.4 kg)   SpO2 98%   BMI 22.24 kg/m  Wt Readings from Last 3 Encounters:  12/28/17 142 lb (64.4 kg)  12/07/17 152 lb (68.9 kg)  11/05/17 157 lb 13.6 oz (71.6 kg)     Lab Results  Component Value Date   WBC  8.2 11/02/2017   HGB 10.9 (L) 11/02/2017   HCT 33.6 (L) 11/02/2017   PLT 189 11/02/2017   GLUCOSE 96 11/02/2017   CHOL 182 09/28/2017   TRIG 196.0 (H) 09/28/2017   HDL 42.70 09/28/2017   LDLDIRECT 128.0 01/26/2017   LDLCALC 100 (H)  09/28/2017   ALT 13 10/31/2017   AST 16 10/31/2017   NA 139 11/02/2017   K 3.6 11/02/2017   CL 109 11/02/2017   CREATININE 1.90 (H) 11/02/2017   BUN 21 11/02/2017   CO2 23 11/02/2017   TSH 1.328 10/31/2017   INR 1.09 10/31/2017   HGBA1C 7.5 (H) 09/28/2017   MICROALBUR 97.4 (H) 11/26/2015    Lab Results  Component Value Date   TSH 1.328 10/31/2017   Lab Results  Component Value Date   WBC 8.2 11/02/2017   HGB 10.9 (L) 11/02/2017   HCT 33.6 (L) 11/02/2017   MCV 92.1 11/02/2017   PLT 189 11/02/2017   Lab Results  Component Value Date   NA 139 11/02/2017   K 3.6 11/02/2017   CO2 23 11/02/2017   GLUCOSE 96 11/02/2017   BUN 21 11/02/2017   CREATININE 1.90 (H) 11/02/2017   BILITOT 1.3 (H) 10/31/2017   ALKPHOS 64 10/31/2017   AST 16 10/31/2017   ALT 13 10/31/2017   PROT 6.6 10/31/2017   ALBUMIN 3.3 (L) 10/31/2017   CALCIUM 8.7 (L) 11/02/2017   ANIONGAP 7 11/02/2017   GFR 27.74 (L) 10/28/2017   Lab Results  Component Value Date   CHOL 182 09/28/2017   Lab Results  Component Value Date   HDL 42.70 09/28/2017   Lab Results  Component Value Date   LDLCALC 100 (H) 09/28/2017   Lab Results  Component Value Date   TRIG 196.0 (H) 09/28/2017   Lab Results  Component Value Date   CHOLHDL 4 09/28/2017   Lab Results  Component Value Date   HGBA1C 7.5 (H) 09/28/2017       Assessment & Plan:   Problem List Items Addressed This Visit    Essential hypertension (Chronic)    Well controlled, no changes to meds. Encouraged heart healthy diet such as the DASH diet and exercise as tolerated.       Relevant Orders   CBC   Comprehensive metabolic panel   TSH   Type 2 diabetes mellitus with diabetic neuropathy, unspecified (HCC)    hgba1c acceptable, minimize simple carbs. Increase exercise as tolerated. Continue current meds       Relevant Orders   Hemoglobin A1c   Hyperlipidemia    Tolerating statin, encouraged heart healthy diet, avoid trans fats, minimize  simple carbs and saturated fats. Increase exercise as tolerated      Relevant Orders   Lipid panel   B12 deficiency    Supplement and monitor       Relevant Orders   Vitamin B12   Hypocalcemia   Relevant Orders   VITAMIN D 25 Hydroxy (Vit-D Deficiency, Fractures)   Weight loss    Has not been trying but does not routinely get hungry. Discussed strategies for increasing caloric intake and protein intake will reassess at next visit and family will monitor and let us know if worsens          I have discontinued Judeth F. Citron's ranitidine. I am also having her start on famotidine. Additionally, I am having her maintain her Fish Oil, nitroGLYCERIN, insulin lispro, ipratropium, albuterol, isosorbide mononitrate, aspirin EC, pantoprazole, insulin glargine, amLODipine, clopidogrel, levETIRAcetam, hydrALAZINE,  thiamine, atorvastatin, and citalopram.  Meds ordered this encounter  Medications  . famotidine (PEPCID) 40 MG tablet    Sig: Take 1 tablet (40 mg total) by mouth daily.    Dispense:  90 tablet    Refill:  1     Penni Homans, MD

## 2017-12-28 NOTE — Assessment & Plan Note (Signed)
Supplement and monitor 

## 2017-12-28 NOTE — Patient Instructions (Addendum)
Whole fat yogurt, coconut oil and avocado, fruit, chia seeds, flaxseed, hemp seeds, yellow split pea powder, whey powder   Cholesterol Cholesterol is a white, waxy, fat-like substance that is needed by the human body in small amounts. The liver makes all the cholesterol we need. Cholesterol is carried from the liver by the blood through the blood vessels. Deposits of cholesterol (plaques) may build up on blood vessel (artery) walls. Plaques make the arteries narrower and stiffer. Cholesterol plaques increase the risk for heart attack and stroke. You cannot feel your cholesterol level even if it is very high. The only way to know that it is high is to have a blood test. Once you know your cholesterol levels, you should keep a record of the test results. Work with your health care provider to keep your levels in the desired range. What do the results mean?  Total cholesterol is a rough measure of all the cholesterol in your blood.  LDL (low-density lipoprotein) is the "bad" cholesterol. This is the type that causes plaque to build up on the artery walls. You want this level to be low.  HDL (high-density lipoprotein) is the "good" cholesterol because it cleans the arteries and carries the LDL away. You want this level to be high.  Triglycerides are fat that the body can either burn for energy or store. High levels are closely linked to heart disease. What are the desired levels of cholesterol?  Total cholesterol below 200.  LDL below 100 for people who are at risk, below 70 for people at very high risk.  HDL above 40 is good. A level of 60 or higher is considered to be protective against heart disease.  Triglycerides below 150. How can I lower my cholesterol? Diet Follow your diet program as told by your health care provider.  Choose fish or white meat chicken and [place], roasted or baked. Limit fatty cuts of red meat, fried foods, and processed meats, such as sausage and lunch meats.  Eat  lots of fresh fruits and vegetables.  Choose whole grains, beans, pasta, potatoes, and cereals.  Choose olive oil, corn oil, or canola oil, and use only small amounts.  Avoid butter, mayonnaise, shortening, or palm kernel oils.  Avoid foods with trans fats.  Drink skim or nonfat milk and eat low-fat or nonfat yogurt and cheeses. Avoid whole milk, cream, ice cream, egg yolks, and full-fat cheeses.  Healthier desserts include angel food cake, ginger snaps, animal crackers, hard candy, popsicles, and low-fat or nonfat frozen yogurt. Avoid pastries, cakes, pies, and cookies.  Exercise  Follow your exercise program as told by your health care provider. A regular program: ? Helps to decrease LDL and raise HDL. ? Helps with weight control.  Do things that increase your activity level, such as gardening, walking, and taking the stairs.  Ask your health care provider about ways that you can be more active in your daily life.  Medicine  Take over-the-counter and prescription medicines only as told by your health care provider. ? Medicine may be prescribed by your health care provider to help lower cholesterol and decrease the risk for heart disease. This is usually done if diet and exercise have failed to bring down cholesterol levels. ? If you have several risk factors, you may need medicine even if your levels are normal.  This information is not intended to replace advice given to you by your health care provider. Make sure you discuss any questions you have with your health  care provider. Document Released: 11/25/2000 Document Revised: 09/28/2015 Document Reviewed: 08/31/2015 Elsevier Interactive Patient Education  [employer].

## 2017-12-29 ENCOUNTER — Other Ambulatory Visit (INDEPENDENT_AMBULATORY_CARE_PROVIDER_SITE_OTHER): Payer: Medicare Other

## 2017-12-29 DIAGNOSIS — E782 Mixed hyperlipidemia: Secondary | ICD-10-CM | POA: Diagnosis not present

## 2017-12-29 DIAGNOSIS — Z8673 Personal history of transient ischemic attack (TIA), and cerebral infarction without residual deficits: Secondary | ICD-10-CM | POA: Diagnosis not present

## 2017-12-29 DIAGNOSIS — E1140 Type 2 diabetes mellitus with diabetic neuropathy, unspecified: Secondary | ICD-10-CM | POA: Diagnosis not present

## 2017-12-29 DIAGNOSIS — Z87440 Personal history of urinary (tract) infections: Secondary | ICD-10-CM | POA: Diagnosis not present

## 2017-12-29 DIAGNOSIS — I130 Hypertensive heart and chronic kidney disease with heart failure and stage 1 through stage 4 chronic kidney disease, or unspecified chronic kidney disease: Secondary | ICD-10-CM | POA: Diagnosis not present

## 2017-12-29 DIAGNOSIS — E538 Deficiency of other specified B group vitamins: Secondary | ICD-10-CM

## 2017-12-29 DIAGNOSIS — J449 Chronic obstructive pulmonary disease, unspecified: Secondary | ICD-10-CM | POA: Diagnosis not present

## 2017-12-29 DIAGNOSIS — I2510 Atherosclerotic heart disease of native coronary artery without angina pectoris: Secondary | ICD-10-CM | POA: Diagnosis not present

## 2017-12-29 DIAGNOSIS — I5032 Chronic diastolic (congestive) heart failure: Secondary | ICD-10-CM | POA: Diagnosis not present

## 2017-12-29 DIAGNOSIS — E1122 Type 2 diabetes mellitus with diabetic chronic kidney disease: Secondary | ICD-10-CM | POA: Diagnosis not present

## 2017-12-29 DIAGNOSIS — I10 Essential (primary) hypertension: Secondary | ICD-10-CM

## 2017-12-29 DIAGNOSIS — E1151 Type 2 diabetes mellitus with diabetic peripheral angiopathy without gangrene: Secondary | ICD-10-CM | POA: Diagnosis not present

## 2017-12-29 DIAGNOSIS — I700 Atherosclerosis of aorta: Secondary | ICD-10-CM | POA: Diagnosis not present

## 2017-12-29 DIAGNOSIS — Z794 Long term (current) use of insulin: Secondary | ICD-10-CM | POA: Diagnosis not present

## 2017-12-29 DIAGNOSIS — N184 Chronic kidney disease, stage 4 (severe): Secondary | ICD-10-CM | POA: Diagnosis not present

## 2017-12-29 DIAGNOSIS — Z87891 Personal history of nicotine dependence: Secondary | ICD-10-CM | POA: Diagnosis not present

## 2017-12-29 DIAGNOSIS — I480 Paroxysmal atrial fibrillation: Secondary | ICD-10-CM | POA: Diagnosis not present

## 2017-12-29 LAB — LIPID PANEL
CHOL/HDL RATIO: 4
Cholesterol: 190 mg/dL (ref 0–200)
HDL: 46.5 mg/dL (ref >39.00)
LDL CALC: 111 mg/dL — AB (ref 0–99)
NonHDL: 143.06
TRIGLYCERIDES: 161 mg/dL — AB (ref 0.0–149.0)
VLDL: 32.2 mg/dL (ref 0.0–40.0)

## 2017-12-29 LAB — CBC
HEMATOCRIT: 33.9 % — AB (ref 36.0–46.0)
HEMOGLOBIN: 11.3 g/dL — AB (ref 12.0–15.0)
MCHC: 33.4 g/dL (ref 30.0–36.0)
MCV: 90.2 fl (ref 78.0–100.0)
Platelets: 210 10*3/uL (ref 150.0–400.0)
RBC: 3.76 Mil/uL — AB (ref 3.87–5.11)
RDW: 14.4 % (ref 11.5–15.5)
WBC: 8.4 10*3/uL (ref 4.0–10.5)

## 2017-12-29 LAB — VITAMIN B12: Vitamin B-12: 405 pg/mL (ref 211–911)

## 2017-12-29 LAB — COMPREHENSIVE METABOLIC PANEL
ALT: 7 U/L (ref 0–35)
AST: 10 U/L (ref 0–37)
Albumin: 3.7 g/dL (ref 3.5–5.2)
Alkaline Phosphatase: 85 U/L (ref 39–117)
BUN: 34 mg/dL — ABNORMAL HIGH (ref 6–23)
CO2: 25 meq/L (ref 19–32)
Calcium: 9.4 mg/dL (ref 8.4–10.5)
Chloride: 105 mEq/L (ref 96–112)
Creatinine, Ser: 1.88 mg/dL — ABNORMAL HIGH (ref 0.40–1.20)
GFR: 27.05 mL/min — ABNORMAL LOW (ref >60.00)
GLUCOSE: 199 mg/dL — AB (ref 70–99)
POTASSIUM: 3.8 meq/L (ref 3.5–5.1)
SODIUM: 138 meq/L (ref 135–145)
TOTAL PROTEIN: 6.7 g/dL (ref 6.0–8.3)
Total Bilirubin: 0.8 mg/dL (ref 0.2–1.2)

## 2017-12-29 LAB — TSH: TSH: 2.11 u[IU]/mL (ref 0.35–4.50)

## 2017-12-29 LAB — VITAMIN D 25 HYDROXY (VIT D DEFICIENCY, FRACTURES): VITD: 32.55 ng/mL (ref 30.00–100.00)

## 2017-12-29 LAB — HEMOGLOBIN A1C: Hgb A1c MFr Bld: 6.1 % (ref 4.6–6.5)

## 2017-12-30 ENCOUNTER — Ambulatory Visit: Payer: Medicare Other | Admitting: Podiatry

## 2017-12-30 DIAGNOSIS — I2510 Atherosclerotic heart disease of native coronary artery without angina pectoris: Secondary | ICD-10-CM | POA: Diagnosis not present

## 2017-12-30 DIAGNOSIS — Z87440 Personal history of urinary (tract) infections: Secondary | ICD-10-CM | POA: Diagnosis not present

## 2017-12-30 DIAGNOSIS — I5032 Chronic diastolic (congestive) heart failure: Secondary | ICD-10-CM | POA: Diagnosis not present

## 2017-12-30 DIAGNOSIS — E1151 Type 2 diabetes mellitus with diabetic peripheral angiopathy without gangrene: Secondary | ICD-10-CM | POA: Diagnosis not present

## 2017-12-30 DIAGNOSIS — J449 Chronic obstructive pulmonary disease, unspecified: Secondary | ICD-10-CM | POA: Diagnosis not present

## 2017-12-30 DIAGNOSIS — I480 Paroxysmal atrial fibrillation: Secondary | ICD-10-CM | POA: Diagnosis not present

## 2017-12-30 DIAGNOSIS — E1122 Type 2 diabetes mellitus with diabetic chronic kidney disease: Secondary | ICD-10-CM | POA: Diagnosis not present

## 2017-12-30 DIAGNOSIS — I130 Hypertensive heart and chronic kidney disease with heart failure and stage 1 through stage 4 chronic kidney disease, or unspecified chronic kidney disease: Secondary | ICD-10-CM | POA: Diagnosis not present

## 2017-12-30 DIAGNOSIS — I700 Atherosclerosis of aorta: Secondary | ICD-10-CM | POA: Diagnosis not present

## 2017-12-30 DIAGNOSIS — Z8673 Personal history of transient ischemic attack (TIA), and cerebral infarction without residual deficits: Secondary | ICD-10-CM | POA: Diagnosis not present

## 2017-12-30 DIAGNOSIS — Z87891 Personal history of nicotine dependence: Secondary | ICD-10-CM | POA: Diagnosis not present

## 2017-12-30 DIAGNOSIS — N184 Chronic kidney disease, stage 4 (severe): Secondary | ICD-10-CM | POA: Diagnosis not present

## 2017-12-30 DIAGNOSIS — Z794 Long term (current) use of insulin: Secondary | ICD-10-CM | POA: Diagnosis not present

## 2017-12-30 MED FILL — LEVETIRACETAM ER 500 MG TAB: 500 | 30 days supply | Qty: 30 | Fill #1

## 2017-12-31 ENCOUNTER — Encounter: Payer: Self-pay | Admitting: Family Medicine

## 2017-12-31 ENCOUNTER — Telehealth: Payer: Self-pay | Admitting: Family Medicine

## 2017-12-31 NOTE — Telephone Encounter (Signed)
Spoke with patient's son, [name], and he did state that [place] did call him and talk to him about their affiliation with [facility] and that we had made them aware of their request to [facility] earlier. He states "his wife thought there was some difference in [facility] being better for them"; the son states that patient is going to do "two more weeks of PT with [facility] and they are going to go from there". He reports that patient has seemed to lose most of the use of her Right leg this week, told him to call us if she needs to be seen in office. At the end of the two weeks, if they still want to move forward with the Hospice referral, they will let us know if they decide to use [place]; I informed him we would cancel the original and place the new referral right away for them. He thanked [place] for calling him and letting him know and stated that "he would talk with his wife about it", and they would let us know what they decide/SLS 10/18 Mosie Lukes, MD  to Me    12/31/17 1:13 PM  Please check with hospice and son if need be

## 2017-12-31 NOTE — Telephone Encounter (Signed)
Received Referral Form for patient to [facility] in [place] with [place], completed as much as possible [diagnosis] and forwarded to PCP. I also showed the message received earlier this morning via My Chart from pt's son, Destiny [name], to Dr. [name] that was requesting referral to [place], as I questioned why these were two different [facility] centers [son did sign the [facility] papers]. Faxed with confirmation; called Elita Boone and informed her that the paperwork was completed and faxed, also informed her of the message from the son earlier today with their preference for patient. She stated that they were affiliated with [facility] and that he may have not understood this and that she will speak with him and make sure; otherwise we will do new referral/SLS 10/18  [name]  to Mosie Lukes, MD       12/31/17 6:53 AM  Hi Dr. Randel Pigg. [facility] is providing home health services for Mom. They mentioned she could benefit from hospice/palliative care. We prefer for her to use [facility]. Would you mind making a referral to Palliative care with [facility]?  Thank you,  Destiny [name]

## 2017-12-31 NOTE — Telephone Encounter (Signed)
Destiny [name] with [facility] dropping off order that needs provider signature & request to have it faxed back. (cover sheet included) Hand given to [facility].

## 2018-01-01 ENCOUNTER — Encounter: Payer: Self-pay | Admitting: Family Medicine

## 2018-01-02 NOTE — Telephone Encounter (Signed)
Perfect thanks

## 2018-01-06 ENCOUNTER — Telehealth: Payer: Self-pay | Admitting: *Deleted

## 2018-01-06 NOTE — Telephone Encounter (Signed)
Received Home Health Re-Certification and Plan of Care; forwarded to provider/SLS 10/24

## 2018-01-11 ENCOUNTER — Ambulatory Visit: Payer: Medicare Other | Admitting: Podiatry

## 2018-01-12 ENCOUNTER — Telehealth: Payer: Self-pay | Admitting: *Deleted

## 2018-01-12 NOTE — Telephone Encounter (Signed)
Received Home Health Certification and Plan of Care; forwarded to provider/SLS 10/30

## 2018-01-19 MED FILL — CLOPIDOGREL 75 MG TABLET: 75 | 90 days supply | Qty: 90 | Fill #1

## 2018-01-20 DIAGNOSIS — I700 Atherosclerosis of aorta: Secondary | ICD-10-CM | POA: Diagnosis not present

## 2018-01-20 DIAGNOSIS — Z8673 Personal history of transient ischemic attack (TIA), and cerebral infarction without residual deficits: Secondary | ICD-10-CM | POA: Diagnosis not present

## 2018-01-20 DIAGNOSIS — Z794 Long term (current) use of insulin: Secondary | ICD-10-CM | POA: Diagnosis not present

## 2018-01-20 DIAGNOSIS — Z87891 Personal history of nicotine dependence: Secondary | ICD-10-CM | POA: Diagnosis not present

## 2018-01-20 DIAGNOSIS — E1151 Type 2 diabetes mellitus with diabetic peripheral angiopathy without gangrene: Secondary | ICD-10-CM | POA: Diagnosis not present

## 2018-01-20 DIAGNOSIS — I5032 Chronic diastolic (congestive) heart failure: Secondary | ICD-10-CM | POA: Diagnosis not present

## 2018-01-20 DIAGNOSIS — I480 Paroxysmal atrial fibrillation: Secondary | ICD-10-CM | POA: Diagnosis not present

## 2018-01-20 DIAGNOSIS — N184 Chronic kidney disease, stage 4 (severe): Secondary | ICD-10-CM | POA: Diagnosis not present

## 2018-01-20 DIAGNOSIS — E1122 Type 2 diabetes mellitus with diabetic chronic kidney disease: Secondary | ICD-10-CM | POA: Diagnosis not present

## 2018-01-20 DIAGNOSIS — I130 Hypertensive heart and chronic kidney disease with heart failure and stage 1 through stage 4 chronic kidney disease, or unspecified chronic kidney disease: Secondary | ICD-10-CM | POA: Diagnosis not present

## 2018-01-20 DIAGNOSIS — I2510 Atherosclerotic heart disease of native coronary artery without angina pectoris: Secondary | ICD-10-CM | POA: Diagnosis not present

## 2018-01-20 DIAGNOSIS — Z87440 Personal history of urinary (tract) infections: Secondary | ICD-10-CM | POA: Diagnosis not present

## 2018-01-20 DIAGNOSIS — J449 Chronic obstructive pulmonary disease, unspecified: Secondary | ICD-10-CM | POA: Diagnosis not present

## 2018-01-23 ENCOUNTER — Encounter: Payer: Self-pay | Admitting: Family Medicine

## 2018-01-24 ENCOUNTER — Other Ambulatory Visit: Payer: Self-pay | Admitting: Family Medicine

## 2018-01-24 MED ORDER — HYDRALAZINE HCL 25 MG PO TABS: 25.0000 mg | ORAL_TABLET | Freq: Two times a day (BID) | ORAL | 1 refills | Status: DC

## 2018-01-24 MED FILL — hydrALAZINE HCL 25 MG TABS: 25 | 90 days supply | Qty: 180 | Fill #0

## 2018-01-25 ENCOUNTER — Encounter: Payer: Self-pay | Admitting: Family Medicine

## 2018-01-26 ENCOUNTER — Telehealth: Payer: Self-pay

## 2018-01-26 NOTE — Telephone Encounter (Signed)
RN call [facility] pharmacy outpatient. Destiny [name] stated the patients current prescription of generic Keppra XR 500mg  is on long term back order. They were not told a date of when it will be available. She stated they do have the plain generic Keppra 500mg  on stock if she can be change to that. Destiny [name] did state if the MD changes her to plain generic keppra 500mg  pt may have to take it twice a day. Rn stated message will be sent to Dr. Leonie Man.

## 2018-01-27 ENCOUNTER — Telehealth: Payer: Self-pay | Admitting: *Deleted

## 2018-01-27 MED ORDER — LEVETIRACETAM 500 MG PO TABS: 500.0000 mg | ORAL_TABLET | Freq: Two times a day (BID) | ORAL | 4 refills | Status: DC

## 2018-01-27 MED FILL — levETIRAcetam 500 MG TABS: 500 | 30 days supply | Qty: 60 | Fill #0

## 2018-01-27 NOTE — Telephone Encounter (Signed)
Keppra XR 500 mg is currently on long-term backordered per [place].  Send order into [facility] for generic Keppra 500 mg twice daily.  Please advise patient to ensure she is taking this [place] every 12 hours.

## 2018-01-27 NOTE — Telephone Encounter (Signed)
Received request for Medical Record information from [employer]; per policy, request was faxed to [place] at [phone] with confirmation/SLS 11/14

## 2018-01-27 NOTE — Telephone Encounter (Signed)
RN call pts son on dpr [name] about medication change. RN stated the pts current dosage 500mg  XR is on back order. RN stated pt will need to be switch to plain generic 500mg  bid. Rn stated it needs to be take 12 hrs as written. RN stated the rx was sent to [facility]. The son verbalized understanding, and knows to call if his mom has any side effects.

## 2018-01-28 ENCOUNTER — Telehealth: Payer: Self-pay | Admitting: Family Medicine

## 2018-01-28 DIAGNOSIS — I480 Paroxysmal atrial fibrillation: Secondary | ICD-10-CM | POA: Diagnosis not present

## 2018-01-28 DIAGNOSIS — I2510 Atherosclerotic heart disease of native coronary artery without angina pectoris: Secondary | ICD-10-CM | POA: Diagnosis not present

## 2018-01-28 DIAGNOSIS — Z87891 Personal history of nicotine dependence: Secondary | ICD-10-CM | POA: Diagnosis not present

## 2018-01-28 DIAGNOSIS — I700 Atherosclerosis of aorta: Secondary | ICD-10-CM | POA: Diagnosis not present

## 2018-01-28 DIAGNOSIS — Z9181 History of falling: Secondary | ICD-10-CM | POA: Diagnosis not present

## 2018-01-28 DIAGNOSIS — N184 Chronic kidney disease, stage 4 (severe): Secondary | ICD-10-CM | POA: Diagnosis not present

## 2018-01-28 DIAGNOSIS — E1151 Type 2 diabetes mellitus with diabetic peripheral angiopathy without gangrene: Secondary | ICD-10-CM | POA: Diagnosis not present

## 2018-01-28 DIAGNOSIS — Z794 Long term (current) use of insulin: Secondary | ICD-10-CM | POA: Diagnosis not present

## 2018-01-28 DIAGNOSIS — E1122 Type 2 diabetes mellitus with diabetic chronic kidney disease: Secondary | ICD-10-CM | POA: Diagnosis not present

## 2018-01-28 DIAGNOSIS — Z8673 Personal history of transient ischemic attack (TIA), and cerebral infarction without residual deficits: Secondary | ICD-10-CM | POA: Diagnosis not present

## 2018-01-28 DIAGNOSIS — I5032 Chronic diastolic (congestive) heart failure: Secondary | ICD-10-CM | POA: Diagnosis not present

## 2018-01-28 DIAGNOSIS — I130 Hypertensive heart and chronic kidney disease with heart failure and stage 1 through stage 4 chronic kidney disease, or unspecified chronic kidney disease: Secondary | ICD-10-CM | POA: Diagnosis not present

## 2018-01-28 DIAGNOSIS — J449 Chronic obstructive pulmonary disease, unspecified: Secondary | ICD-10-CM | POA: Diagnosis not present

## 2018-01-28 DIAGNOSIS — Z87440 Personal history of urinary (tract) infections: Secondary | ICD-10-CM | POA: Diagnosis not present

## 2018-01-28 NOTE — Telephone Encounter (Signed)
Copied from [place] [id number]. Topic: Quick Communication - Home Health Verbal Orders >> Jan 28, 2018  3:09 PM Adelene Idler wrote: Caller/Agency: [facility] Number: [phone] Requesting OT/PT/Skilled Nursing/Social Work: PT Frequency: 1 x week for 2 weeks ; recertify for home health PT following week to keep pt going

## 2018-01-31 NOTE — Telephone Encounter (Signed)
Left message for [place] Gave for verbal orders

## 2018-02-03 ENCOUNTER — Telehealth: Payer: Self-pay | Admitting: *Deleted

## 2018-02-03 NOTE — Telephone Encounter (Signed)
Received Physician Orders from [place] at [facility]; forwarded to provider/SLS 11/21

## 2018-02-03 NOTE — Telephone Encounter (Signed)
Received request for Medical Record information from [place]; per policy, request was faxed to [place] at [phone] with confirmation/SLS 11/21

## 2018-02-05 DIAGNOSIS — I2510 Atherosclerotic heart disease of native coronary artery without angina pectoris: Secondary | ICD-10-CM | POA: Diagnosis not present

## 2018-02-05 DIAGNOSIS — I5032 Chronic diastolic (congestive) heart failure: Secondary | ICD-10-CM | POA: Diagnosis not present

## 2018-02-05 DIAGNOSIS — Z794 Long term (current) use of insulin: Secondary | ICD-10-CM | POA: Diagnosis not present

## 2018-02-05 DIAGNOSIS — Z8673 Personal history of transient ischemic attack (TIA), and cerebral infarction without residual deficits: Secondary | ICD-10-CM | POA: Diagnosis not present

## 2018-02-05 DIAGNOSIS — Z9181 History of falling: Secondary | ICD-10-CM | POA: Diagnosis not present

## 2018-02-05 DIAGNOSIS — I130 Hypertensive heart and chronic kidney disease with heart failure and stage 1 through stage 4 chronic kidney disease, or unspecified chronic kidney disease: Secondary | ICD-10-CM | POA: Diagnosis not present

## 2018-02-05 DIAGNOSIS — E1151 Type 2 diabetes mellitus with diabetic peripheral angiopathy without gangrene: Secondary | ICD-10-CM | POA: Diagnosis not present

## 2018-02-05 DIAGNOSIS — E1122 Type 2 diabetes mellitus with diabetic chronic kidney disease: Secondary | ICD-10-CM | POA: Diagnosis not present

## 2018-02-05 DIAGNOSIS — N184 Chronic kidney disease, stage 4 (severe): Secondary | ICD-10-CM | POA: Diagnosis not present

## 2018-02-05 DIAGNOSIS — I700 Atherosclerosis of aorta: Secondary | ICD-10-CM | POA: Diagnosis not present

## 2018-02-05 DIAGNOSIS — Z87440 Personal history of urinary (tract) infections: Secondary | ICD-10-CM | POA: Diagnosis not present

## 2018-02-05 DIAGNOSIS — J449 Chronic obstructive pulmonary disease, unspecified: Secondary | ICD-10-CM | POA: Diagnosis not present

## 2018-02-05 DIAGNOSIS — Z87891 Personal history of nicotine dependence: Secondary | ICD-10-CM | POA: Diagnosis not present

## 2018-02-05 DIAGNOSIS — I480 Paroxysmal atrial fibrillation: Secondary | ICD-10-CM | POA: Diagnosis not present

## 2018-02-07 ENCOUNTER — Telehealth: Payer: Self-pay | Admitting: Family Medicine

## 2018-02-07 NOTE — Telephone Encounter (Signed)
Verbal orders given  

## 2018-02-07 NOTE — Telephone Encounter (Signed)
Copied from [place] #[id number]. Topic: Quick Communication - See Telephone Encounter >> Feb 07, 2018  9:00 AM Burchel, Abbi R wrote: CRM for notification. See Telephone encounter for: 02/07/18.  Sharyn Lull [phone] *ok to leave Vm*  [facility] for PT 2x5wk and 1x2wk, to work on balance and safety

## 2018-02-18 ENCOUNTER — Ambulatory Visit: Payer: Self-pay

## 2018-02-18 DIAGNOSIS — N184 Chronic kidney disease, stage 4 (severe): Secondary | ICD-10-CM | POA: Diagnosis not present

## 2018-02-18 DIAGNOSIS — Z87440 Personal history of urinary (tract) infections: Secondary | ICD-10-CM | POA: Diagnosis not present

## 2018-02-18 DIAGNOSIS — I700 Atherosclerosis of aorta: Secondary | ICD-10-CM | POA: Diagnosis not present

## 2018-02-18 DIAGNOSIS — L89892 Pressure ulcer of other site, stage 2: Secondary | ICD-10-CM | POA: Diagnosis not present

## 2018-02-18 DIAGNOSIS — Z87891 Personal history of nicotine dependence: Secondary | ICD-10-CM | POA: Diagnosis not present

## 2018-02-18 DIAGNOSIS — E1122 Type 2 diabetes mellitus with diabetic chronic kidney disease: Secondary | ICD-10-CM | POA: Diagnosis not present

## 2018-02-18 DIAGNOSIS — Z8673 Personal history of transient ischemic attack (TIA), and cerebral infarction without residual deficits: Secondary | ICD-10-CM | POA: Diagnosis not present

## 2018-02-18 DIAGNOSIS — Z794 Long term (current) use of insulin: Secondary | ICD-10-CM | POA: Diagnosis not present

## 2018-02-18 DIAGNOSIS — I2510 Atherosclerotic heart disease of native coronary artery without angina pectoris: Secondary | ICD-10-CM | POA: Diagnosis not present

## 2018-02-18 DIAGNOSIS — I130 Hypertensive heart and chronic kidney disease with heart failure and stage 1 through stage 4 chronic kidney disease, or unspecified chronic kidney disease: Secondary | ICD-10-CM | POA: Diagnosis not present

## 2018-02-18 DIAGNOSIS — I480 Paroxysmal atrial fibrillation: Secondary | ICD-10-CM | POA: Diagnosis not present

## 2018-02-18 DIAGNOSIS — Z9181 History of falling: Secondary | ICD-10-CM | POA: Diagnosis not present

## 2018-02-18 DIAGNOSIS — I5032 Chronic diastolic (congestive) heart failure: Secondary | ICD-10-CM | POA: Diagnosis not present

## 2018-02-18 DIAGNOSIS — J449 Chronic obstructive pulmonary disease, unspecified: Secondary | ICD-10-CM | POA: Diagnosis not present

## 2018-02-18 DIAGNOSIS — E1151 Type 2 diabetes mellitus with diabetic peripheral angiopathy without gangrene: Secondary | ICD-10-CM | POA: Diagnosis not present

## 2018-02-18 NOTE — Telephone Encounter (Signed)
thanks

## 2018-02-18 NOTE — Telephone Encounter (Signed)
Destiny [name] with [facility] called to report pt.'s pulse is 54 today. No symptoms. Her parameters are to call the provider for pulse less than 60. For any orders, call son, [name] - [phone].

## 2018-02-18 NOTE — Telephone Encounter (Signed)
Spoke with Destiny [name] from [facility] to let her know the recommendation from [place], NP, she agreed.  Also was directed to speak with Destiny [name] Nurse supervisor  [phone]), to change her parameters to HR <50. Spoke with Destiny [name] she agreed and changed orders. No further work up needed.

## 2018-02-18 NOTE — Telephone Encounter (Signed)
Looks like that is her normal HR.  No changes to meds. Let's change parameters to call if HR < 50.

## 2018-02-18 NOTE — Telephone Encounter (Signed)
Destiny [name]  Please advise in [place] absence

## 2018-02-21 DIAGNOSIS — E1122 Type 2 diabetes mellitus with diabetic chronic kidney disease: Secondary | ICD-10-CM | POA: Diagnosis not present

## 2018-02-21 DIAGNOSIS — I2510 Atherosclerotic heart disease of native coronary artery without angina pectoris: Secondary | ICD-10-CM | POA: Diagnosis not present

## 2018-02-21 DIAGNOSIS — Z9181 History of falling: Secondary | ICD-10-CM | POA: Diagnosis not present

## 2018-02-21 DIAGNOSIS — Z8673 Personal history of transient ischemic attack (TIA), and cerebral infarction without residual deficits: Secondary | ICD-10-CM | POA: Diagnosis not present

## 2018-02-21 DIAGNOSIS — I700 Atherosclerosis of aorta: Secondary | ICD-10-CM | POA: Diagnosis not present

## 2018-02-21 DIAGNOSIS — Z794 Long term (current) use of insulin: Secondary | ICD-10-CM | POA: Diagnosis not present

## 2018-02-21 DIAGNOSIS — Z87440 Personal history of urinary (tract) infections: Secondary | ICD-10-CM | POA: Diagnosis not present

## 2018-02-21 DIAGNOSIS — J449 Chronic obstructive pulmonary disease, unspecified: Secondary | ICD-10-CM | POA: Diagnosis not present

## 2018-02-21 DIAGNOSIS — N184 Chronic kidney disease, stage 4 (severe): Secondary | ICD-10-CM | POA: Diagnosis not present

## 2018-02-21 DIAGNOSIS — I5032 Chronic diastolic (congestive) heart failure: Secondary | ICD-10-CM | POA: Diagnosis not present

## 2018-02-21 DIAGNOSIS — E1151 Type 2 diabetes mellitus with diabetic peripheral angiopathy without gangrene: Secondary | ICD-10-CM | POA: Diagnosis not present

## 2018-02-21 DIAGNOSIS — L89892 Pressure ulcer of other site, stage 2: Secondary | ICD-10-CM | POA: Diagnosis not present

## 2018-02-21 DIAGNOSIS — Z87891 Personal history of nicotine dependence: Secondary | ICD-10-CM | POA: Diagnosis not present

## 2018-02-21 DIAGNOSIS — I130 Hypertensive heart and chronic kidney disease with heart failure and stage 1 through stage 4 chronic kidney disease, or unspecified chronic kidney disease: Secondary | ICD-10-CM | POA: Diagnosis not present

## 2018-02-21 DIAGNOSIS — I480 Paroxysmal atrial fibrillation: Secondary | ICD-10-CM | POA: Diagnosis not present

## 2018-02-24 ENCOUNTER — Other Ambulatory Visit: Payer: Self-pay | Admitting: Family Medicine

## 2018-02-24 MED FILL — PANTOPRAZOLE SOD DR 40 MG T: 40 | 90 days supply | Qty: 90 | Fill #0

## 2018-02-25 MED FILL — levETIRAcetam 500 MG TABS: 500 | 30 days supply | Qty: 60 | Fill #1

## 2018-02-25 MED FILL — AMLODIPINE BESYLATE 5 MG TA: 5 | 90 days supply | Qty: 90 | Fill #1

## 2018-02-25 MED FILL — LANTUS SOLOSTAR 100 UNITS/M: 100 | 25 days supply | Qty: 9 | Fill #1

## 2018-03-01 NOTE — Progress Notes (Signed)
[facility] [address] [place]. [place] [address] [phone]       OFFICE FOLLOW-UP NOTE  Ms. Destiny [name] Date of Birth:  1931/03/26 Medical Record Number:  [id number]   Chief Complaint  Patient presents with  . Follow-up    TIA follow up pt with son Destiny [name] and [facility] caregiver     HPI: [name] is a pleasant 84 year Caucasian lady seen today for first office follow-up visit following hospital admission for stroke in November 2019. History is obtained from the patient, son and caregiver who accompany her today for this visit. I have personally reviewed hospital electronic medical records and imaging films.Destiny Escalante Brooksis an 84 y.o.femalewith past medical history of coronary artery disease status post CABG, hypertension, diabetes, GI bleeding, TIAs, CKD, mild dementiawho presents with sudden onset aphasia noticed her on 6:30 - 7 pm.The patient was apparently normal this morning however around 3 PM home health noticed that she had a dazed look and wasconfused.This lasted for a few minutes and the patient returned to her baseline. Her granddaughter checked on her at5 PM and grandson around 5:20 PM and she was perfectly fine and had was having a normal conversation. However son calledbetween 6:30 and 7 PM,she seemed very confused and not making sense. He called 911 and when paramedics arrived it was noted the patient had difficulty with language. Blood pressure was 283 systolic on arrival.Patient was brought to to [facility] ER around9PM. Examination was consistent for isolated aphasiathe patient having difficulty naming, repetition and paraphasic errors. CT head was negative. CTA was not performed due toCKDand low suspicion of largevessel occlusion. After discussing risks versus benefits with family and decided to administer TPA. TPA was administered after lowering blood pressure with labetalol. Date last known well:11.19.18 Time last known well:5.20  pm.tPA Given:yes. NIHSS:3.Baseline MRS2. The patient showed neurological improvement shortly after getting TPA. She was monitored in intensive care unit and blood pressure was tightly controlled. MRI scan of the brain did not show any acute infarct. MRA of the neck showed possible high-grade left ICA stenosis however carotid ultrasound showed only 1-39% stenosis bilaterally. EEG shows focal left-sided and diffuse generalized slowing but no epileptiform activity. LDL cholesterol was 77 mg percent and hemoglobin A1c was 8.4. Patient was started on Plavix for stroke prevention. She has done well since discharge. She is living at home. She had mild Alzheimer's dementia at baseline. In the past she has not tolerated forgot any benefit from Aricept and Namenda and in fact aspirin her son and she showed improvement after those medications were discontinued. She has a caregiver for 4 hours daily. She is alone at night. Her daughter lives right next door and keeps a close watch on her. Patient is tolerating Plavix without bleeding or bruising. Her blood pressure up appears to be reasonably well controlled and today it is 141/73. Her fasting sugars continue to be high. She is on insulin and to see her medical doctor regularly. She's had no recurrent episodes of speech disturbance or seizure-like activity. She does have a remote history of possible TIA 4 years ago when she had sudden onset of facial drooping and nearly blacked out. This was transient and she refused to go to the hospital. She saw her primary care physician a few days later and brain imaging was unremarkable.  Update 12/07/2017 PS : She returns for follow-up after last visit 82 months ago.  She is accompanied by her caregiver.  The patient is currently living  with her son.  She has caregiver throughout the day.  In August 2019 the patient was found following at home after the caregiver left.  When help arrived she was found to have some slurred speech and  blank look on her face and appeared confused.  She was admitted to [facility] where MRI scan was obtained which showed no acute abnormalities.  There were advanced changes of chronic small vessel disease.  Intracranial MRA showed severe atherosclerotic irregular narrowing most prominent in the left cavernous ICA.  The right vertebral artery appears to be nondominant but occluded.  There was severe stenosis of of the right posterior cerebral artery P2 segment.  EEG did not show seizure activity and showed mild generalized slowing..  Patient had had a similar episode last year for which she was admitted and thought to have an acute stroke and was treated with IV TPA and MRI had not shown a stroke.  Patient continues to have mild cognitive impairment and memory loss from her baseline dementia.  She is overall quite well behaved at home without any agitation.  There is no delusions or hallucinations.  There are no unsafe behavior she can be easily redirected.  Overall the caregiver feels that her cognitive impairment and memory loss are unchanged.  She remains on aspirin for stroke prevention which is tolerating well without bruising or bleeding.  She is also on Lipitor 20 mg daily.  Plavix was added during the recent admission as this episode was considered a TIA.  Interval history 03/02/2018: Patient is being seen today for 82-month follow-up visit. She has been doing well without any recurrent episodes. She continues on keppra 500mg  twice daily without side effects. Son believes that her memory has been worsening over the past few months along with worsening confusion. She has attempted to wander from house but denies behavioral issues. Caregiver is present from 9am-1pm. She continues to live with her son. Son is considering whether to restart on aricept or namenda due to this worsening. She has continued on both aspirin and plavix without side effects of bleeding but has had mild bruising. Blood pressure  today 175/55 with recheck 162/58, asymptomatic. This is not monitored at home but caregiver and son plan on starting.  Caregiver is concerned regarding daytime fatigue and frequent naps.  Son believes she sleeps well throughout the night but patient states she consistently awakens and has not been obtaining restful sleep.  Patient is supposed to be having home health physical therapy through [facility] but unfortunately has been having difficulty with them coming to the home or consistently canceling appointments.  Son is questioning at one point should he be contacting palliative care and pursuing more of a comfort based approach.  Provided son with information regarding [facility] with just 1 navigator and they will be contacted to help assist patient and her son for further resources and possible interest in day program.  No further concerns at this time.    ROS:   14 system review of systems is positive for appetite change, chills, fatigue, constipation, leg swelling, daytime sleepiness, incontinence of bladder, walking difficulty, wounds, bruise easily, memory loss, numbness, speech difficulty and confusion and all other systems negative.   PMH:  Past Medical History:  Diagnosis Date  . Acute renal insufficiency 12/24/2013  . Amaurosis fugax   . Arthritis 12/06/2012  . Atherosclerosis   . CAD (coronary artery disease)    a. s/p CABG x 4 (LIMA->LAD, VG->Diag, VG->OM1->OM3);  b.  2010 Cath: 3/4 patent grafts (VG->diag occluded), 3vd, sev PVD ->Med Rx;  c. 12/2011 Lexi MV: sm area of mild mid and apical ant wall ischemia ->med rx.  . Carotid bruit   . Chronic kidney disease (CKD) stage G4/A1, severely decreased glomerular filtration rate (GFR) between 15-29 mL/min/1.73 square meter and albuminuria creatinine ratio less than 30 mg/g (HCC)    GFR 25 on 12/25/13  . CVA (cerebral infarction)   . Dementia ([place])    pt denies  . Depression 12/24/2013  . Diabetes mellitus type II     insulin dependent.   . Diabetic peripheral neuropathy ([place])   . Diabetic retinopathy   . Diverticulosis   . Duodenal ulcer 2009 and 11/2011   caused GI bleeding  . Dyspnea   . Gallstones 2009   seen on CT scan  . GI bleed 2011/05/12, 11/2011   found non-bleeding duodenal ulcers  . Hyperlipidemia   . Hypersomnolence 03/12/2013  . Hypertension   . Iron deficiency anemia secondary to blood loss (chronic)   . Melena 1015/2015   PEPTIC ULCER    REQUIRING TRANSFUSION  . Pedal edema 10/12/2016  . PVD (peripheral vascular disease) ([place])   . Rib fracture 04/21/2015  . Stroke ([place])   . Thiamine deficiency 08/13/2015  . UTI (urinary tract infection) 04/08/2014  . Weight loss, non-intentional 01/29/2014    Social History:  Social History   Socioeconomic History  . Marital status: Widowed    Spouse name: Not on file  . Number of children: 4  . Years of education: Not on file  . Highest education level: Not on file  Occupational History  . Occupation: retired    Fish farm manager: RETIRED  Social Needs  . Financial resource strain: Not on file  . Food insecurity:    Worry: Not on file    Inability: Not on file  . Transportation needs:    Medical: Not on file    Non-medical: Not on file  Tobacco Use  . Smoking status: Former Smoker    Types: Cigarettes    Last attempt to quit: 12/31/2000    Years since quitting: 17.1  . Smokeless tobacco: Never Used  Substance and Sexual Activity  . Alcohol use: No  . Drug use: No  . Sexual activity: Not on file  Lifestyle  . Physical activity:    Days per week: Not on file    Minutes per session: Not on file  . Stress: Not on file  Relationships  . Social connections:    Talks on phone: Not on file    Gets together: Not on file    Attends religious service: Not on file    Active member of club or organization: Not on file    Attends meetings of clubs or organizations: Not on file    Relationship status: Not on file  . Intimate partner violence:     Fear of current or ex partner: Not on file    Emotionally abused: Not on file    Physically abused: Not on file    Forced sexual activity: Not on file  Other Topics Concern  . Not on file  Social History Narrative   Widowed - husband passed away in 05-11-2009   lives with daughter with epilepsy - mental retardation   Retired     Former Smoker      Alcohol use-no          Occupation: Retired       son -  Roxanne Mins     Medications:   Current Outpatient Medications on File Prior to Visit  Medication Sig Dispense Refill  . albuterol (PROAIR HFA) 108 (90 Base) MCG/ACT inhaler Inhale 2 puffs into the lungs every 6 (six) hours as needed for wheezing or shortness of breath. 18 g 2  . amLODipine (NORVASC) 10 MG tablet Take 1 tablet (10 mg total) by mouth daily.    Marland Kitchen aspirin EC 81 MG tablet Take 1 tablet (81 mg total) by mouth daily.    Marland Kitchen atorvastatin (LIPITOR) 20 MG tablet TAKE 1 TABLET (20 MG TOTAL) BY MOUTH DAILY. 90 tablet 1  . citalopram (CELEXA) 20 MG tablet TAKE 1 TABLET (20 MG TOTAL) BY MOUTH DAILY. 90 tablet 1  . famotidine (PEPCID) 40 MG tablet Take 1 tablet (40 mg total) by mouth daily. 90 tablet 1  . hydrALAZINE (APRESOLINE) 25 MG tablet Take 1 tablet (25 mg total) by mouth 2 (two) times daily. 180 tablet 1  . insulin glargine (LANTUS) 100 UNIT/ML injection Inject 0.15 mLs (15 Units total) into the skin daily. 1500 mL 1  . insulin lispro (HUMALOG KWIKPEN) 100 UNIT/ML KiwkPen Inject 0.05-0.1 mLs (5-10 Units total) into the skin 3 (three) times daily with meals. 15 mL 4  . ipratropium (ATROVENT HFA) 17 MCG/ACT inhaler Inhale 2 puffs into the lungs every 6 (six) hours as needed for wheezing. 1 Inhaler 12  . isosorbide mononitrate (IMDUR) 30 MG 24 hr tablet Take 1 tablet (30 mg total) by mouth at bedtime. (Patient taking differently: Take 30 mg by mouth daily. ) 90 tablet 1  . nitroGLYCERIN (NITROSTAT) 0.4 MG SL tablet Place 0.4 mg under the tongue every 5 (five) minutes as needed for  chest pain.    . Omega-3 Fatty Acids (FISH OIL) 1200 MG CAPS Take 1,200 mg by mouth daily.     . pantoprazole (PROTONIX) 40 MG tablet TAKE 1 TABLET BY MOUTH ONCE DAILY 90 tablet 0  . thiamine (VITAMIN B-1) 100 MG tablet 1 tab po M, W, F 30 tablet 5   No current facility-administered medications on file prior to visit.     Allergies:  No Known Allergies  Physical Exam General: well developed, well nourished elderly Caucasian lady, seated, in no evident distress Head: head normocephalic and atraumatic.  Neck: supple with no carotid or supraclavicular bruits Cardiovascular: regular rate and rhythm, no murmurs Musculoskeletal: no deformity Skin:  no rash/petichiae Vascular:  Normal pulses all extremities Vitals:   03/02/18 0958  BP: (!) 162/58  Pulse: (!) 48   Neurologic Exam Mental Status: Awake and fully alert. Oriented to place and time. Recent and remote memory poor. Attention span, concentration and fund of knowledge diminished. Mood and affect appropriate.  Very pleasant and cooperative with examination. Cranial Nerves: Pupils equal, briskly reactive to light. Extraocular movements full without nystagmus. Visual fields full to confrontation. Hearing intact. Facial sensation intact. Face, tongue, palate moves normally and symmetrically.  Motor: Normal bulk and tone. Normal strength in all tested extremity muscles except for mild weakness in bilateral hip flexors Sensory.: intact to touch ,pinprick .position and vibratory sensation.  Coordination: Rapid alternating movements normal in all extremities. Finger-to-nose and heel-to-shin performed accurately bilaterally. Gait and Station: Arises from chair without difficulty. Stance is normal. Gait demonstrates normal stride length and balance . Able to heel, toe and tandem walk without difficulty.  Reflexes: 1+ and symmetric. Toes downgoing.      ASSESSMENT: 84 year old Caucasian lady with transient episode of speech disturbance  and  expressive aphasia unclear wether left hemispheric TIA vesrus Complex partial seizure.Multiple vascular risk factors of diabetes, hypertension, hyperlipidemia and age . Long-standing history of mild Alzheimer's dementia.  Patient is being seen today for 56-month follow-up visit and has been stable on Keppra without any recurrent TIA versus complex partial seizure activity.  Son is concerned regarding possible worsening of her memory.    PLAN: -Continue aspirin 81 mg and Plavix 75 mg and Lipitor for secondary stroke prevention -Advised to stop aspirin next week as at this time 33-month DAPT completed and will continue on Plavix alone -refill provided -Continue Keppra 500 mg twice daily (was previously on Keppra XR 500 mg daily but unfortunately due to shortage, was switched to IR during the interval time) -refill provided -We will reach out to [facility] to assist son with further resources and information regarding dementia assistance along with possibly participating in memory care day program -Continue to follow with PCP regarding HLD, HTN and DM management -Son questioning whether to restart Aricept or Namenda but would like to speak to his wife in regards to this and will follow-up with PCP if they are interested in restarting -Encouraged increasing daytime activity as tolerated with attempts of decreasing daytime naps and reviewed sleep hygiene with son and caregiver -Continue to monitor blood pressure at home -Maintain strict control of hypertension with blood pressure goal below 130/90, diabetes with hemoglobin A1c goal below 6.5% and cholesterol with LDL cholesterol (bad cholesterol) goal below 70 mg/dL. I also advised the patient to eat a healthy diet with plenty of whole grains, cereals, fruits and vegetables, exercise regularly and maintain ideal body weight.  Follow-up in 6 months time but advised to call with questions, concerns or need of sooner follow-up appointment  Greater  than 50% of time during this 25 minute visit was spent on counseling,explanation of diagnosis, planning of further management, discussion with patient and family and coordination of care  Venancio Poisson, [facility]  [facility] [address] [place], [place] [address]  Phone [phone] Fax [phone] Note: This document was prepared with digital dictation and possible smart phrase technology. Any transcriptional errors that result from this process are unintentional.

## 2018-03-02 ENCOUNTER — Ambulatory Visit: Payer: Medicare Other | Admitting: Adult Health

## 2018-03-02 ENCOUNTER — Encounter: Payer: Self-pay | Admitting: Adult Health

## 2018-03-02 ENCOUNTER — Telehealth: Payer: Self-pay

## 2018-03-02 VITALS — BP 162/58 | HR 48 | Ht 67.0 in | Wt 147.8 lb

## 2018-03-02 DIAGNOSIS — Z8673 Personal history of transient ischemic attack (TIA), and cerebral infarction without residual deficits: Secondary | ICD-10-CM | POA: Diagnosis not present

## 2018-03-02 DIAGNOSIS — F0280 Dementia in other diseases classified elsewhere without behavioral disturbance: Secondary | ICD-10-CM | POA: Diagnosis not present

## 2018-03-02 DIAGNOSIS — L89892 Pressure ulcer of other site, stage 2: Secondary | ICD-10-CM | POA: Diagnosis not present

## 2018-03-02 DIAGNOSIS — N184 Chronic kidney disease, stage 4 (severe): Secondary | ICD-10-CM | POA: Diagnosis not present

## 2018-03-02 DIAGNOSIS — G40109 Localization-related (focal) (partial) symptomatic epilepsy and epileptic syndromes with simple partial seizures, not intractable, without status epilepticus: Secondary | ICD-10-CM

## 2018-03-02 DIAGNOSIS — E1151 Type 2 diabetes mellitus with diabetic peripheral angiopathy without gangrene: Secondary | ICD-10-CM | POA: Diagnosis not present

## 2018-03-02 DIAGNOSIS — G301 Alzheimer's disease with late onset: Secondary | ICD-10-CM | POA: Diagnosis not present

## 2018-03-02 DIAGNOSIS — I5032 Chronic diastolic (congestive) heart failure: Secondary | ICD-10-CM | POA: Diagnosis not present

## 2018-03-02 DIAGNOSIS — Z87440 Personal history of urinary (tract) infections: Secondary | ICD-10-CM | POA: Diagnosis not present

## 2018-03-02 DIAGNOSIS — I130 Hypertensive heart and chronic kidney disease with heart failure and stage 1 through stage 4 chronic kidney disease, or unspecified chronic kidney disease: Secondary | ICD-10-CM | POA: Diagnosis not present

## 2018-03-02 DIAGNOSIS — I2510 Atherosclerotic heart disease of native coronary artery without angina pectoris: Secondary | ICD-10-CM | POA: Diagnosis not present

## 2018-03-02 DIAGNOSIS — Z9181 History of falling: Secondary | ICD-10-CM | POA: Diagnosis not present

## 2018-03-02 DIAGNOSIS — Z87891 Personal history of nicotine dependence: Secondary | ICD-10-CM | POA: Diagnosis not present

## 2018-03-02 DIAGNOSIS — J449 Chronic obstructive pulmonary disease, unspecified: Secondary | ICD-10-CM | POA: Diagnosis not present

## 2018-03-02 DIAGNOSIS — I700 Atherosclerosis of aorta: Secondary | ICD-10-CM | POA: Diagnosis not present

## 2018-03-02 DIAGNOSIS — G459 Transient cerebral ischemic attack, unspecified: Secondary | ICD-10-CM | POA: Diagnosis not present

## 2018-03-02 DIAGNOSIS — E1122 Type 2 diabetes mellitus with diabetic chronic kidney disease: Secondary | ICD-10-CM | POA: Diagnosis not present

## 2018-03-02 DIAGNOSIS — I480 Paroxysmal atrial fibrillation: Secondary | ICD-10-CM | POA: Diagnosis not present

## 2018-03-02 DIAGNOSIS — Z794 Long term (current) use of insulin: Secondary | ICD-10-CM | POA: Diagnosis not present

## 2018-03-02 MED ORDER — CLOPIDOGREL BISULFATE 75 MG PO TABS: 75.0000 mg | ORAL_TABLET | Freq: Every day | ORAL | 3 refills | Status: AC

## 2018-03-02 MED ORDER — LEVETIRACETAM 500 MG PO TABS: 500.0000 mg | ORAL_TABLET | Freq: Two times a day (BID) | ORAL | 3 refills | Status: AC

## 2018-03-02 NOTE — Patient Instructions (Addendum)
Continue aspirin 81 mg daily and clopidogrel 75 mg daily  and lipitor  for secondary stroke prevention  Stop aspirin next week and continue on plavix alone - refill provided   Continue keppra 500mg  twice daily - refill provided   We will call [facility] who will contact you to set up appointment   Continue to follow up with PCP regarding cholesterol, blood pressure and diabetes management   Speak with PCP about possibly restarting aricept or namenda  Continue to encourage activity during the day and maintain a healthy diet   Continue to monitor blood pressure at home  Maintain strict control of hypertension with blood pressure goal below 130/90, diabetes with hemoglobin A1c goal below 6.5% and cholesterol with LDL cholesterol (bad cholesterol) goal below 70 mg/dL. I also advised the patient to eat a healthy diet with plenty of whole grains, cereals, fruits and vegetables, exercise regularly and maintain ideal body weight.  Followup in the future with me in 6 months or call earlier if needed       Thank you for coming to see [place] at [facility]. I hope we have been able to provide you high quality care today.  You may receive a patient satisfaction survey over the next few weeks. We would appreciate your feedback and comments so that we may continue to improve ourselves and the health of our patients.

## 2018-03-02 NOTE — Telephone Encounter (Signed)
Rn spoke with Destiny [name] at [facility] about pt being evaluated for the program. Rn gave Destiny [name] the contact information for the son. A medical will be fax to our office if the family wants to move forward with the process. [name] contact number is [phone].

## 2018-03-02 NOTE — Telephone Encounter (Signed)
Left vm for Destiny [name] at [facility] for possible pt for memory care day program. RN left number to contact back.

## 2018-03-10 DIAGNOSIS — Z9181 History of falling: Secondary | ICD-10-CM | POA: Diagnosis not present

## 2018-03-10 DIAGNOSIS — N184 Chronic kidney disease, stage 4 (severe): Secondary | ICD-10-CM | POA: Diagnosis not present

## 2018-03-10 DIAGNOSIS — I480 Paroxysmal atrial fibrillation: Secondary | ICD-10-CM | POA: Diagnosis not present

## 2018-03-10 DIAGNOSIS — E1122 Type 2 diabetes mellitus with diabetic chronic kidney disease: Secondary | ICD-10-CM | POA: Diagnosis not present

## 2018-03-10 DIAGNOSIS — Z87440 Personal history of urinary (tract) infections: Secondary | ICD-10-CM | POA: Diagnosis not present

## 2018-03-10 DIAGNOSIS — Z794 Long term (current) use of insulin: Secondary | ICD-10-CM | POA: Diagnosis not present

## 2018-03-10 DIAGNOSIS — I2510 Atherosclerotic heart disease of native coronary artery without angina pectoris: Secondary | ICD-10-CM | POA: Diagnosis not present

## 2018-03-10 DIAGNOSIS — L89892 Pressure ulcer of other site, stage 2: Secondary | ICD-10-CM | POA: Diagnosis not present

## 2018-03-10 DIAGNOSIS — I130 Hypertensive heart and chronic kidney disease with heart failure and stage 1 through stage 4 chronic kidney disease, or unspecified chronic kidney disease: Secondary | ICD-10-CM | POA: Diagnosis not present

## 2018-03-10 DIAGNOSIS — Z87891 Personal history of nicotine dependence: Secondary | ICD-10-CM | POA: Diagnosis not present

## 2018-03-10 DIAGNOSIS — J449 Chronic obstructive pulmonary disease, unspecified: Secondary | ICD-10-CM | POA: Diagnosis not present

## 2018-03-10 DIAGNOSIS — I5032 Chronic diastolic (congestive) heart failure: Secondary | ICD-10-CM | POA: Diagnosis not present

## 2018-03-10 DIAGNOSIS — Z8673 Personal history of transient ischemic attack (TIA), and cerebral infarction without residual deficits: Secondary | ICD-10-CM | POA: Diagnosis not present

## 2018-03-10 DIAGNOSIS — E1151 Type 2 diabetes mellitus with diabetic peripheral angiopathy without gangrene: Secondary | ICD-10-CM | POA: Diagnosis not present

## 2018-03-10 DIAGNOSIS — I700 Atherosclerosis of aorta: Secondary | ICD-10-CM | POA: Diagnosis not present

## 2018-03-14 ENCOUNTER — Other Ambulatory Visit: Payer: Self-pay

## 2018-03-14 NOTE — Patient Outreach (Signed)
[facility]) Care Management  03/14/2018  SAUL DORSI [birth year] [id number]   Medication Adherence call to Mrs. Darene Lamer patient did not answer patient is due on Atorvastatin 20 mg. Mrs. Baroni is showing past due under [facility].   [facility] Management Direct Dial [phone]  Fax [phone] [email or web address]

## 2018-03-15 DIAGNOSIS — I2510 Atherosclerotic heart disease of native coronary artery without angina pectoris: Secondary | ICD-10-CM | POA: Diagnosis not present

## 2018-03-15 DIAGNOSIS — N184 Chronic kidney disease, stage 4 (severe): Secondary | ICD-10-CM | POA: Diagnosis not present

## 2018-03-15 DIAGNOSIS — Z794 Long term (current) use of insulin: Secondary | ICD-10-CM | POA: Diagnosis not present

## 2018-03-15 DIAGNOSIS — I5032 Chronic diastolic (congestive) heart failure: Secondary | ICD-10-CM | POA: Diagnosis not present

## 2018-03-15 DIAGNOSIS — I700 Atherosclerosis of aorta: Secondary | ICD-10-CM | POA: Diagnosis not present

## 2018-03-15 DIAGNOSIS — L89892 Pressure ulcer of other site, stage 2: Secondary | ICD-10-CM | POA: Diagnosis not present

## 2018-03-15 DIAGNOSIS — Z9181 History of falling: Secondary | ICD-10-CM | POA: Diagnosis not present

## 2018-03-15 DIAGNOSIS — J449 Chronic obstructive pulmonary disease, unspecified: Secondary | ICD-10-CM | POA: Diagnosis not present

## 2018-03-15 DIAGNOSIS — I480 Paroxysmal atrial fibrillation: Secondary | ICD-10-CM | POA: Diagnosis not present

## 2018-03-15 DIAGNOSIS — Z8673 Personal history of transient ischemic attack (TIA), and cerebral infarction without residual deficits: Secondary | ICD-10-CM | POA: Diagnosis not present

## 2018-03-15 DIAGNOSIS — Z87440 Personal history of urinary (tract) infections: Secondary | ICD-10-CM | POA: Diagnosis not present

## 2018-03-15 DIAGNOSIS — E1122 Type 2 diabetes mellitus with diabetic chronic kidney disease: Secondary | ICD-10-CM | POA: Diagnosis not present

## 2018-03-15 DIAGNOSIS — E1151 Type 2 diabetes mellitus with diabetic peripheral angiopathy without gangrene: Secondary | ICD-10-CM | POA: Diagnosis not present

## 2018-03-15 DIAGNOSIS — Z87891 Personal history of nicotine dependence: Secondary | ICD-10-CM | POA: Diagnosis not present

## 2018-03-15 DIAGNOSIS — I130 Hypertensive heart and chronic kidney disease with heart failure and stage 1 through stage 4 chronic kidney disease, or unspecified chronic kidney disease: Secondary | ICD-10-CM | POA: Diagnosis not present

## 2018-03-17 DIAGNOSIS — I5032 Chronic diastolic (congestive) heart failure: Secondary | ICD-10-CM | POA: Diagnosis not present

## 2018-03-17 DIAGNOSIS — I480 Paroxysmal atrial fibrillation: Secondary | ICD-10-CM | POA: Diagnosis not present

## 2018-03-17 DIAGNOSIS — Z87440 Personal history of urinary (tract) infections: Secondary | ICD-10-CM | POA: Diagnosis not present

## 2018-03-17 DIAGNOSIS — L89892 Pressure ulcer of other site, stage 2: Secondary | ICD-10-CM | POA: Diagnosis not present

## 2018-03-17 DIAGNOSIS — J449 Chronic obstructive pulmonary disease, unspecified: Secondary | ICD-10-CM | POA: Diagnosis not present

## 2018-03-17 DIAGNOSIS — E1122 Type 2 diabetes mellitus with diabetic chronic kidney disease: Secondary | ICD-10-CM | POA: Diagnosis not present

## 2018-03-17 DIAGNOSIS — Z9181 History of falling: Secondary | ICD-10-CM | POA: Diagnosis not present

## 2018-03-17 DIAGNOSIS — I2510 Atherosclerotic heart disease of native coronary artery without angina pectoris: Secondary | ICD-10-CM | POA: Diagnosis not present

## 2018-03-17 DIAGNOSIS — I130 Hypertensive heart and chronic kidney disease with heart failure and stage 1 through stage 4 chronic kidney disease, or unspecified chronic kidney disease: Secondary | ICD-10-CM | POA: Diagnosis not present

## 2018-03-17 DIAGNOSIS — Z8673 Personal history of transient ischemic attack (TIA), and cerebral infarction without residual deficits: Secondary | ICD-10-CM | POA: Diagnosis not present

## 2018-03-17 DIAGNOSIS — I700 Atherosclerosis of aorta: Secondary | ICD-10-CM | POA: Diagnosis not present

## 2018-03-17 DIAGNOSIS — Z794 Long term (current) use of insulin: Secondary | ICD-10-CM | POA: Diagnosis not present

## 2018-03-17 DIAGNOSIS — N184 Chronic kidney disease, stage 4 (severe): Secondary | ICD-10-CM | POA: Diagnosis not present

## 2018-03-17 DIAGNOSIS — E1151 Type 2 diabetes mellitus with diabetic peripheral angiopathy without gangrene: Secondary | ICD-10-CM | POA: Diagnosis not present

## 2018-03-17 DIAGNOSIS — Z87891 Personal history of nicotine dependence: Secondary | ICD-10-CM | POA: Diagnosis not present

## 2018-03-20 NOTE — Progress Notes (Signed)
I agree with the above plan 

## 2018-03-23 DIAGNOSIS — I480 Paroxysmal atrial fibrillation: Secondary | ICD-10-CM | POA: Diagnosis not present

## 2018-03-23 DIAGNOSIS — Z87440 Personal history of urinary (tract) infections: Secondary | ICD-10-CM | POA: Diagnosis not present

## 2018-03-23 DIAGNOSIS — L89892 Pressure ulcer of other site, stage 2: Secondary | ICD-10-CM | POA: Diagnosis not present

## 2018-03-23 DIAGNOSIS — I130 Hypertensive heart and chronic kidney disease with heart failure and stage 1 through stage 4 chronic kidney disease, or unspecified chronic kidney disease: Secondary | ICD-10-CM | POA: Diagnosis not present

## 2018-03-23 DIAGNOSIS — Z794 Long term (current) use of insulin: Secondary | ICD-10-CM | POA: Diagnosis not present

## 2018-03-23 DIAGNOSIS — Z8673 Personal history of transient ischemic attack (TIA), and cerebral infarction without residual deficits: Secondary | ICD-10-CM | POA: Diagnosis not present

## 2018-03-23 DIAGNOSIS — I2510 Atherosclerotic heart disease of native coronary artery without angina pectoris: Secondary | ICD-10-CM | POA: Diagnosis not present

## 2018-03-23 DIAGNOSIS — E1151 Type 2 diabetes mellitus with diabetic peripheral angiopathy without gangrene: Secondary | ICD-10-CM | POA: Diagnosis not present

## 2018-03-23 DIAGNOSIS — N184 Chronic kidney disease, stage 4 (severe): Secondary | ICD-10-CM | POA: Diagnosis not present

## 2018-03-23 DIAGNOSIS — Z87891 Personal history of nicotine dependence: Secondary | ICD-10-CM | POA: Diagnosis not present

## 2018-03-23 DIAGNOSIS — E1122 Type 2 diabetes mellitus with diabetic chronic kidney disease: Secondary | ICD-10-CM | POA: Diagnosis not present

## 2018-03-23 DIAGNOSIS — I700 Atherosclerosis of aorta: Secondary | ICD-10-CM | POA: Diagnosis not present

## 2018-03-23 DIAGNOSIS — J449 Chronic obstructive pulmonary disease, unspecified: Secondary | ICD-10-CM | POA: Diagnosis not present

## 2018-03-23 DIAGNOSIS — Z9181 History of falling: Secondary | ICD-10-CM | POA: Diagnosis not present

## 2018-03-23 DIAGNOSIS — I5032 Chronic diastolic (congestive) heart failure: Secondary | ICD-10-CM | POA: Diagnosis not present

## 2018-03-25 MED FILL — ATORVASTATIN CALCIUM 20 MG: 20 | 90 days supply | Qty: 90 | Fill #1

## 2018-03-25 MED FILL — CITALOPRAM HBR 20 MG TABLET: 20 | 90 days supply | Qty: 90 | Fill #1

## 2018-03-30 ENCOUNTER — Ambulatory Visit: Payer: Medicare Other | Admitting: Podiatry

## 2018-03-30 DIAGNOSIS — B351 Tinea unguium: Secondary | ICD-10-CM

## 2018-03-30 DIAGNOSIS — E1142 Type 2 diabetes mellitus with diabetic polyneuropathy: Secondary | ICD-10-CM | POA: Diagnosis not present

## 2018-03-30 DIAGNOSIS — M79674 Pain in right toe(s): Secondary | ICD-10-CM

## 2018-03-30 DIAGNOSIS — M79675 Pain in left toe(s): Secondary | ICD-10-CM

## 2018-03-30 NOTE — Patient Instructions (Signed)
Diabetes Mellitus and Foot Care Foot care is an important part of your health, especially when you have diabetes. Diabetes may cause you to have problems because of poor blood flow (circulation) to your feet and legs, which can cause your skin to:  Become thinner and drier.  Break more easily.  Heal more slowly.  Peel and crack. You may also have nerve damage (neuropathy) in your legs and feet, causing decreased feeling in them. This means that you may not notice minor injuries to your feet that could lead to more serious problems. Noticing and addressing any potential problems early is the best way to prevent future foot problems. How to care for your feet Foot hygiene  Wash your feet daily with warm water and mild soap. Do not use hot water. Then, pat your feet and the areas between your toes until they are completely dry. Do not soak your feet as this can dry your skin.  Trim your toenails straight across. Do not dig under them or around the cuticle. File the edges of your nails with an emery board or nail file.  Apply a moisturizing lotion or petroleum jelly to the skin on your feet and to dry, brittle toenails. Use lotion that does not contain alcohol and is unscented. Do not apply lotion between your toes. Shoes and socks  Wear clean socks or stockings every day. Make sure they are not too tight. Do not wear knee-high stockings since they may decrease blood flow to your legs.  Wear shoes that fit properly and have enough cushioning. Always look in your shoes before you put them on to be sure there are no objects inside.  To break in new shoes, wear them for just a few hours a day. This prevents injuries on your feet. Wounds, scrapes, corns, and calluses  Check your feet daily for blisters, cuts, bruises, sores, and redness. If you cannot see the bottom of your feet, use a mirror or ask someone for help.  Do not cut corns or calluses or try to remove them with medicine.  If you  find a minor scrape, cut, or break in the skin on your feet, keep it and the skin around it clean and dry. You may clean these areas with mild soap and water. Do not clean the area with peroxide, alcohol, or iodine.  If you have a wound, scrape, corn, or callus on your foot, look at it several times a day to make sure it is healing and not infected. Check for: ? Redness, swelling, or pain. ? Fluid or blood. ? Warmth. ? Pus or a bad smell. General instructions  Do not cross your legs. This may decrease blood flow to your feet.  Do not use heating pads or hot water bottles on your feet. They may burn your skin. If you have lost feeling in your feet or legs, you may not know this is happening until it is too late.  Protect your feet from hot and cold by wearing shoes, such as at the beach or on hot pavement.  Schedule a complete foot exam at least once a year (annually) or more often if you have foot problems. If you have foot problems, report any cuts, sores, or bruises to your health care provider immediately. Contact a health care provider if:  You have a medical condition that increases your risk of infection and you have any cuts, sores, or bruises on your feet.  You have an injury that is not   healing.  You have redness on your legs or feet.  You feel burning or tingling in your legs or feet.  You have pain or cramps in your legs and feet.  Your legs or feet are numb.  Your feet always feel cold.  You have pain around a toenail. Get help right away if:  You have a wound, scrape, corn, or callus on your foot and: ? You have pain, swelling, or redness that gets worse. ? You have fluid or blood coming from the wound, scrape, corn, or callus. ? Your wound, scrape, corn, or callus feels warm to the touch. ? You have pus or a bad smell coming from the wound, scrape, corn, or callus. ? You have a fever. ? You have a red line going up your leg. Summary  Check your feet every day  for cuts, sores, red spots, swelling, and blisters.  Moisturize feet and legs daily.  Wear shoes that fit properly and have enough cushioning.  If you have foot problems, report any cuts, sores, or bruises to your health care provider immediately.  Schedule a complete foot exam at least once a year (annually) or more often if you have foot problems. This information is not intended to replace advice given to you by your health care provider. Make sure you discuss any questions you have with your health care provider. Document Released: 02/28/2000 Document Revised: 04/14/2017 Document Reviewed: 04/03/2016 Elsevier Interactive Patient Education  2019 [employer]  Onychomycosis/Fungal Toenails  WHAT IS IT? An infection that lies within the keratin of your nail plate that is caused by a fungus.  WHY ME? Fungal infections affect all ages, sexes, races, and creeds.  There may be many factors that predispose you to a fungal infection such as age, coexisting medical conditions such as diabetes, or an autoimmune disease; stress, medications, fatigue, genetics, etc.  Bottom line: fungus thrives in a warm, moist environment and your shoes offer such a location.  IS IT CONTAGIOUS? Theoretically, yes.  You do not want to share shoes, nail clippers or files with someone who has fungal toenails.  Walking around barefoot in the same room or sleeping in the same bed is unlikely to transfer the organism.  It is important to realize, however, that fungus can spread easily from one nail to the next on the same foot.  HOW DO WE TREAT THIS?  There are several ways to treat this condition.  Treatment may depend on many factors such as age, medications, pregnancy, liver and kidney conditions, etc.  It is best to ask your doctor which options are available to you.  1. No treatment.   Unlike many other medical concerns, you can live with this condition.  However for many people this can be a painful condition and  may lead to ingrown toenails or a bacterial infection.  It is recommended that you keep the nails cut short to help reduce the amount of fungal nail. 2. Topical treatment.  These range from herbal remedies to prescription strength nail lacquers.  About 40-50% effective, topicals require twice daily application for approximately 9 to 12 months or until an entirely new nail has grown out.  The most effective topicals are medical grade medications available through physicians offices. 3. Oral antifungal medications.  With an 80-90% cure rate, the most common oral medication requires 3 to 4 months of therapy and stays in your system for a year as the new nail grows out.  Oral antifungal medications do require   blood work to make sure it is a safe drug for you.  A liver function panel will be performed prior to starting the medication and after the first month of treatment.  It is important to have the blood work performed to avoid any harmful side effects.  In general, this medication safe but blood work is required. 4. Laser Therapy.  This treatment is performed by applying a specialized laser to the affected nail plate.  This therapy is noninvasive, fast, and non-painful.  It is not covered by insurance and is therefore, out of pocket.  The results have been very good with a 80-95% cure rate.  [facility] is the only practice in the area to offer this therapy. 5. Permanent Nail Avulsion.  Removing the entire nail so that a new nail will not grow back. 

## 2018-03-31 ENCOUNTER — Ambulatory Visit: Payer: Medicare Other | Admitting: Family Medicine

## 2018-04-01 DIAGNOSIS — I700 Atherosclerosis of aorta: Secondary | ICD-10-CM | POA: Diagnosis not present

## 2018-04-01 DIAGNOSIS — E1151 Type 2 diabetes mellitus with diabetic peripheral angiopathy without gangrene: Secondary | ICD-10-CM | POA: Diagnosis not present

## 2018-04-01 DIAGNOSIS — I2510 Atherosclerotic heart disease of native coronary artery without angina pectoris: Secondary | ICD-10-CM | POA: Diagnosis not present

## 2018-04-01 DIAGNOSIS — Z794 Long term (current) use of insulin: Secondary | ICD-10-CM | POA: Diagnosis not present

## 2018-04-01 DIAGNOSIS — Z87891 Personal history of nicotine dependence: Secondary | ICD-10-CM | POA: Diagnosis not present

## 2018-04-01 DIAGNOSIS — Z8673 Personal history of transient ischemic attack (TIA), and cerebral infarction without residual deficits: Secondary | ICD-10-CM | POA: Diagnosis not present

## 2018-04-01 DIAGNOSIS — Z9181 History of falling: Secondary | ICD-10-CM | POA: Diagnosis not present

## 2018-04-01 DIAGNOSIS — Z87440 Personal history of urinary (tract) infections: Secondary | ICD-10-CM | POA: Diagnosis not present

## 2018-04-01 DIAGNOSIS — E1122 Type 2 diabetes mellitus with diabetic chronic kidney disease: Secondary | ICD-10-CM | POA: Diagnosis not present

## 2018-04-01 DIAGNOSIS — N184 Chronic kidney disease, stage 4 (severe): Secondary | ICD-10-CM | POA: Diagnosis not present

## 2018-04-01 DIAGNOSIS — I130 Hypertensive heart and chronic kidney disease with heart failure and stage 1 through stage 4 chronic kidney disease, or unspecified chronic kidney disease: Secondary | ICD-10-CM | POA: Diagnosis not present

## 2018-04-01 DIAGNOSIS — I5032 Chronic diastolic (congestive) heart failure: Secondary | ICD-10-CM | POA: Diagnosis not present

## 2018-04-01 DIAGNOSIS — J449 Chronic obstructive pulmonary disease, unspecified: Secondary | ICD-10-CM | POA: Diagnosis not present

## 2018-04-01 DIAGNOSIS — L89892 Pressure ulcer of other site, stage 2: Secondary | ICD-10-CM | POA: Diagnosis not present

## 2018-04-01 DIAGNOSIS — I480 Paroxysmal atrial fibrillation: Secondary | ICD-10-CM | POA: Diagnosis not present

## 2018-04-08 ENCOUNTER — Ambulatory Visit (INDEPENDENT_AMBULATORY_CARE_PROVIDER_SITE_OTHER): Payer: Medicare Other | Admitting: Family Medicine

## 2018-04-08 ENCOUNTER — Encounter: Payer: Self-pay | Admitting: Family Medicine

## 2018-04-08 VITALS — BP 142/58 | HR 53 | Temp 97.8°F | Resp 18 | Wt 145.4 lb

## 2018-04-08 DIAGNOSIS — F418 Other specified anxiety disorders: Secondary | ICD-10-CM | POA: Diagnosis not present

## 2018-04-08 DIAGNOSIS — N289 Disorder of kidney and ureter, unspecified: Secondary | ICD-10-CM

## 2018-04-08 DIAGNOSIS — E1140 Type 2 diabetes mellitus with diabetic neuropathy, unspecified: Secondary | ICD-10-CM

## 2018-04-08 DIAGNOSIS — E782 Mixed hyperlipidemia: Secondary | ICD-10-CM | POA: Diagnosis not present

## 2018-04-08 DIAGNOSIS — I4891 Unspecified atrial fibrillation: Secondary | ICD-10-CM

## 2018-04-08 DIAGNOSIS — I10 Essential (primary) hypertension: Secondary | ICD-10-CM | POA: Diagnosis not present

## 2018-04-08 DIAGNOSIS — E2839 Other primary ovarian failure: Secondary | ICD-10-CM

## 2018-04-08 DIAGNOSIS — R5381 Other malaise: Secondary | ICD-10-CM

## 2018-04-08 DIAGNOSIS — F0391 Unspecified dementia with behavioral disturbance: Secondary | ICD-10-CM

## 2018-04-08 LAB — LIPID PANEL
Cholesterol: 200 mg/dL (ref 0–200)
HDL: 54 mg/dL (ref >39.00)
LDL Cholesterol: 117 mg/dL — ABNORMAL HIGH (ref 0–99)
NonHDL: 145.88
TRIGLYCERIDES: 142 mg/dL (ref 0.0–149.0)
Total CHOL/HDL Ratio: 4
VLDL: 28.4 mg/dL (ref 0.0–40.0)

## 2018-04-08 LAB — CBC
HCT: 36.2 % (ref 36.0–46.0)
Hemoglobin: 12 g/dL (ref 12.0–15.0)
MCHC: 33.1 g/dL (ref 30.0–36.0)
MCV: 92.1 fl (ref 78.0–100.0)
Platelets: 210 10*3/uL (ref 150.0–400.0)
RBC: 3.93 Mil/uL (ref 3.87–5.11)
RDW: 14.1 % (ref 11.5–15.5)
WBC: 7.6 10*3/uL (ref 4.0–10.5)

## 2018-04-08 LAB — COMPREHENSIVE METABOLIC PANEL
ALT: 10 U/L (ref 0–35)
AST: 13 U/L (ref 0–37)
Albumin: 3.8 g/dL (ref 3.5–5.2)
Alkaline Phosphatase: 74 U/L (ref 39–117)
BUN: 52 mg/dL — ABNORMAL HIGH (ref 6–23)
CALCIUM: 9.2 mg/dL (ref 8.4–10.5)
CO2: 21 mEq/L (ref 19–32)
CREATININE: 2.28 mg/dL — AB (ref 0.40–1.20)
Chloride: 109 mEq/L (ref 96–112)
GFR: 20.36 mL/min — ABNORMAL LOW (ref >60.00)
Glucose, Bld: 124 mg/dL — ABNORMAL HIGH (ref 70–99)
Potassium: 4.1 mEq/L (ref 3.5–5.1)
Sodium: 141 mEq/L (ref 135–145)
Total Bilirubin: 0.6 mg/dL (ref 0.2–1.2)
Total Protein: 6.7 g/dL (ref 6.0–8.3)

## 2018-04-08 LAB — TSH: TSH: 2.38 u[IU]/mL (ref 0.35–4.50)

## 2018-04-08 LAB — HEMOGLOBIN A1C: Hgb A1c MFr Bld: 6.3 % (ref 4.6–6.5)

## 2018-04-08 MED ORDER — HYDRALAZINE HCL 25 MG PO TABS: 25.0000 mg | ORAL_TABLET | Freq: Two times a day (BID) | ORAL | 1 refills | Status: AC

## 2018-04-08 MED ORDER — ISOSORBIDE MONONITRATE ER 30 MG PO TB24: 30.0000 mg | ORAL_TABLET | Freq: Every day | ORAL | 1 refills | Status: AC

## 2018-04-08 MED ORDER — ATORVASTATIN CALCIUM 20 MG PO TABS: 20.0000 mg | ORAL_TABLET | Freq: Every day | ORAL | 1 refills | Status: AC

## 2018-04-08 MED ORDER — CITALOPRAM HYDROBROMIDE 20 MG PO TABS: 20.0000 mg | ORAL_TABLET | Freq: Every day | ORAL | 1 refills | Status: AC

## 2018-04-08 MED ORDER — FAMOTIDINE 40 MG PO TABS: 40.0000 mg | ORAL_TABLET | Freq: Every day | ORAL | 1 refills | Status: AC

## 2018-04-08 MED FILL — FAMOTIDINE 40 MG TABLET: 40 | 90 days supply | Qty: 90 | Fill #0

## 2018-04-08 MED FILL — ISOSORBIDE MN ER 30 MG TAB: 30 | 90 days supply | Qty: 90 | Fill #0

## 2018-04-08 NOTE — Progress Notes (Signed)
Subjective:    Patient ID: Destiny [name], female    DOB: February 24, 1932, 85 y.o.   MRN: [id number]  No chief complaint on file.   HPI  Patient is in today for four month follow up visit for several chronic conditions. Pt's son mostly spoke for her. Pt has been having some sundowning symptoms at night about once/wk and think's she is back in her own neighborhood and tries to "go home." She was able to come down form 30 units of lantus to 5 units and her sugars have been running between 95-107 daily. One of her son's and her daughter have died within the last 3 years and she/her son are struggling with that somewhat.  Patient Care Team: Mosie Lukes, MD as PCP - General (Family Medicine)   Past Medical History:  Diagnosis Date  . Acute renal insufficiency 12/24/2013  . Amaurosis fugax   . Arthritis 12/06/2012  . Atherosclerosis   . CAD (coronary artery disease)    a. s/p CABG x 4 (LIMA->LAD, VG->Diag, VG->OM1->OM3);  b. 2010 Cath: 3/4 patent grafts (VG->diag occluded), 3vd, sev PVD ->Med Rx;  c. 12/2011 Lexi MV: sm area of mild mid and apical ant wall ischemia ->med rx.  . Carotid bruit   . Chronic kidney disease (CKD) stage G4/A1, severely decreased glomerular filtration rate (GFR) between 15-29 mL/min/1.73 square meter and albuminuria creatinine ratio less than 30 mg/g (HCC)    GFR 25 on 12/25/13  . CVA (cerebral infarction)   . Dementia ([place])    pt denies  . Depression 12/24/2013  . Diabetes mellitus type II    insulin dependent.   . Diabetic peripheral neuropathy ([place])   . Diabetic retinopathy   . Diverticulosis   . Duodenal ulcer 2009 and 11/2011   caused GI bleeding  . Dyspnea   . Gallstones 2009   seen on CT scan  . GI bleed 04/2011, 11/2011   found non-bleeding duodenal ulcers  . Hyperlipidemia   . Hypersomnolence 03/12/2013  . Hypertension   . Iron deficiency anemia secondary to blood loss (chronic)   . Melena 1015/2015   PEPTIC ULCER    REQUIRING TRANSFUSION  .  Pedal edema 10/12/2016  . PVD (peripheral vascular disease) ([place])   . Rib fracture 04/21/2015  . Stroke ([place])   . Thiamine deficiency 08/13/2015  . UTI (urinary tract infection) 04/08/2014  . Weight loss, non-intentional 01/29/2014    Past Surgical History:  Procedure Laterality Date  . ABDOMINAL HYSTERECTOMY    . COLONOSCOPY  05/20/2011   Procedure: COLONOSCOPY;  Surgeon: Scarlette Shorts, MD;  Location: [facility];  Service: Endoscopy;  Laterality: N/A;  . COLONOSCOPY WITH PROPOFOL N/A 06/09/2017   Procedure: COLONOSCOPY WITH PROPOFOL;  Surgeon: Milus Banister, MD;  Location: [facility] ENDOSCOPY;  Service: Endoscopy;  Laterality: N/A;  . CORONARY ARTERY BYPASS GRAFT  1999   x 4  . ENTEROSCOPY N/A 12/29/2013   Procedure: ENTEROSCOPY;  Surgeon: Inda Castle, MD;  Location: [facility];  Service: Endoscopy;  Laterality: N/A;  . ESOPHAGOGASTRODUODENOSCOPY  05/16/2011   Procedure: ESOPHAGOGASTRODUODENOSCOPY (EGD);  Surgeon: Gatha Mayer, MD;  Location: [facility] ENDOSCOPY;  Service: Endoscopy;  Laterality: N/A;  . ESOPHAGOGASTRODUODENOSCOPY  12/08/2011   Procedure: ESOPHAGOGASTRODUODENOSCOPY (EGD);  Surgeon: Ladene Artist, MD,FACG;  Location[facility] ENDOSCOPY;  Service: Endoscopy;  Laterality: N/A;  . ESOPHAGOGASTRODUODENOSCOPY (EGD) WITH PROPOFOL N/A 06/08/2017   Procedure: ESOPHAGOGASTRODUODENOSCOPY (EGD) WITH PROPOFOL;  Surgeon: Milus Banister, MD;  Location: [facility] ENDOSCOPY;  Service: Endoscopy;  Laterality: N/A;  . UPPER GASTROINTESTINAL ENDOSCOPY  02/22/2008   w/biopsy, duedenal ulcer, antrum erosions    Family History  Problem Relation Age of Onset  . Heart disease Son   . Coronary artery disease Father   . Hypertension Father   . Stroke Father   . Cancer Mother        ?  . Diabetes Maternal Uncle   . Stroke Sister   . Epilepsy Daughter   . Colon cancer Neg Hx     Social History   Socioeconomic History  . Marital status: Widowed    Spouse name: Not on file  . Number of children: 4  .  Years of education: Not on file  . Highest education level: Not on file  Occupational History  . Occupation: retired    Fish farm manager: RETIRED  Social Needs  . Financial resource strain: Not on file  . Food insecurity:    Worry: Not on file    Inability: Not on file  . Transportation needs:    Medical: Not on file    Non-medical: Not on file  Tobacco Use  . Smoking status: Former Smoker    Types: Cigarettes    Last attempt to quit: 12/31/2000    Years since quitting: 17.2  . Smokeless tobacco: Never Used  Substance and Sexual Activity  . Alcohol use: No  . Drug use: No  . Sexual activity: Not on file  Lifestyle  . Physical activity:    Days per week: Not on file    Minutes per session: Not on file  . Stress: Not on file  Relationships  . Social connections:    Talks on phone: Not on file    Gets together: Not on file    Attends religious service: Not on file    Active member of club or organization: Not on file    Attends meetings of clubs or organizations: Not on file    Relationship status: Not on file  . Intimate partner violence:    Fear of current or ex partner: Not on file    Emotionally abused: Not on file    Physically abused: Not on file    Forced sexual activity: Not on file  Other Topics Concern  . Not on file  Social History Narrative   Widowed - husband passed away in 05/13/2009   lives with daughter with epilepsy - mental retardation   Retired     Former Smoker      Alcohol use-no          Occupation: Retired       son - Preslee Regas     Outpatient Medications Prior to Visit  Medication Sig Dispense Refill  . albuterol (PROAIR HFA) 108 (90 Base) MCG/ACT inhaler Inhale 2 puffs into the lungs every 6 (six) hours as needed for wheezing or shortness of breath. 18 g 2  . amLODipine (NORVASC) 10 MG tablet Take 1 tablet (10 mg total) by mouth daily.    Marland Kitchen atorvastatin (LIPITOR) 20 MG tablet TAKE 1 TABLET (20 MG TOTAL) BY MOUTH DAILY. 90 tablet 1  . citalopram  (CELEXA) 20 MG tablet TAKE 1 TABLET (20 MG TOTAL) BY MOUTH DAILY. 90 tablet 1  . clopidogrel (PLAVIX) 75 MG tablet Take 1 tablet (75 mg total) by mouth daily. 90 tablet 3  . famotidine (PEPCID) 40 MG tablet Take 1 tablet (40 mg total) by mouth daily. 90 tablet 1  . hydrALAZINE (APRESOLINE) 25 MG tablet Take  1 tablet (25 mg total) by mouth 2 (two) times daily. 180 tablet 1  . insulin glargine (LANTUS) 100 UNIT/ML injection Inject 0.15 mLs (15 Units total) into the skin daily. 1500 mL 1  . ipratropium (ATROVENT HFA) 17 MCG/ACT inhaler Inhale 2 puffs into the lungs every 6 (six) hours as needed for wheezing. 1 Inhaler 12  . isosorbide mononitrate (IMDUR) 30 MG 24 hr tablet Take 1 tablet (30 mg total) by mouth at bedtime. (Patient taking differently: Take 30 mg by mouth daily. ) 90 tablet 1  . levETIRAcetam (KEPPRA) 500 MG tablet Take 1 tablet (500 mg total) by mouth 2 (two) times daily. 180 tablet 3  . nitroGLYCERIN (NITROSTAT) 0.4 MG SL tablet Place 0.4 mg under the tongue every 5 (five) minutes as needed for chest pain.    . Omega-3 Fatty Acids (FISH OIL) 1200 MG CAPS Take 1,200 mg by mouth daily.     . pantoprazole (PROTONIX) 40 MG tablet TAKE 1 TABLET BY MOUTH ONCE DAILY 90 tablet 0  . thiamine (VITAMIN B-1) 100 MG tablet 1 tab po M, W, F 30 tablet 5  . aspirin EC 81 MG tablet Take 1 tablet (81 mg total) by mouth daily.    . insulin lispro (HUMALOG KWIKPEN) 100 UNIT/ML KiwkPen Inject 0.05-0.1 mLs (5-10 Units total) into the skin 3 (three) times daily with meals. 15 mL 4   No facility-administered medications prior to visit.     No Known Allergies  Review of Systems  Constitutional: Negative for chills, fever, malaise/fatigue and weight loss.  HENT: Negative for ear pain.   Eyes: Negative for pain.  Respiratory: Negative for shortness of breath.   Cardiovascular: Negative for chest pain and palpitations.  Gastrointestinal: Negative for abdominal pain, constipation and diarrhea.    Neurological: Negative for dizziness and headaches.       Objective:     Destiny [name] appears her stated age, WDWN, and in no acute distress.   Physical Exam Constitutional:      Appearance: Normal appearance.  HENT:     Head: Normocephalic and atraumatic.     Nose: Nose normal.     Mouth/Throat:     Mouth: Mucous membranes are moist.     Pharynx: Oropharynx is clear.  Cardiovascular:     Rate and Rhythm: Normal rate and regular rhythm.     Heart sounds: Normal heart sounds.  Pulmonary:     Breath sounds: Normal breath sounds.  Abdominal:     General: Bowel sounds are normal.     Palpations: Abdomen is soft.  Neurological:     Mental Status: She is alert.  Psychiatric:        Mood and Affect: Mood normal.        Behavior: Behavior normal.     BP (!) 142/58 (BP Location: Left Arm, Patient Position: Sitting, Cuff Size: Normal)   Pulse (!) 53   Temp 97.8 F (36.6 C) (Oral)   Resp 18   Wt 66 kg   SpO2 98%   BMI 22.77 kg/m  Wt Readings from Last 3 Encounters:  04/08/18 66 kg  03/02/18 67 kg  12/28/17 64.4 kg   BP Readings from Last 3 Encounters:  04/08/18 (!) 142/58  03/02/18 (!) 162/58  12/28/17 (!) 142/70     Immunization History  Administered Date(s) Administered  . Influenza Split 12/07/2011  . Influenza Whole 03/28/2007, 01/09/2008, 12/25/2008, 11/27/2009  . Influenza, High Dose Seasonal PF 01/05/2015, 11/26/2015, 12/26/2016, 12/09/2017  . Influenza,inj,Quad  PF,6+ Mos 12/06/2012, 12/29/2013  . Influenza-Unspecified 01/05/2015, 12/26/2016  . Pneumococcal Polysaccharide-23 01/10/2008, 11/15/2012  . Td 07/04/2007    Health Maintenance  Topic Date Due  . DEXA SCAN  04/17/1998  . PNA vac Low Risk Adult (2 of 2 - PCV13) 11/15/2013  . OPHTHALMOLOGY EXAM  03/18/2016  . TETANUS/TDAP  07/03/2017  . FOOT EXAM  07/06/2017  . HEMOGLOBIN A1C  06/30/2018  . INFLUENZA VACCINE  Completed    Lab Results  Component Value Date   WBC 8.4 12/29/2017   HGB  11.3 (L) 12/29/2017   HCT 33.9 (L) 12/29/2017   PLT 210.0 12/29/2017   GLUCOSE 199 (H) 12/29/2017   CHOL 190 12/29/2017   TRIG 161.0 (H) 12/29/2017   HDL 46.50 12/29/2017   LDLDIRECT 128.0 01/26/2017   LDLCALC 111 (H) 12/29/2017   ALT 7 12/29/2017   AST 10 12/29/2017   NA 138 12/29/2017   K 3.8 12/29/2017   CL 105 12/29/2017   CREATININE 1.88 (H) 12/29/2017   BUN 34 (H) 12/29/2017   CO2 25 12/29/2017   TSH 2.11 12/29/2017   INR 1.09 10/31/2017   HGBA1C 6.1 12/29/2017   MICROALBUR 97.4 (H) 11/26/2015    Lab Results  Component Value Date   TSH 2.11 12/29/2017   Lab Results  Component Value Date   WBC 8.4 12/29/2017   HGB 11.3 (L) 12/29/2017   HCT 33.9 (L) 12/29/2017   MCV 90.2 12/29/2017   PLT 210.0 12/29/2017   Lab Results  Component Value Date   NA 138 12/29/2017   K 3.8 12/29/2017   CO2 25 12/29/2017   GLUCOSE 199 (H) 12/29/2017   BUN 34 (H) 12/29/2017   CREATININE 1.88 (H) 12/29/2017   BILITOT 0.8 12/29/2017   ALKPHOS 85 12/29/2017   AST 10 12/29/2017   ALT 7 12/29/2017   PROT 6.7 12/29/2017   ALBUMIN 3.7 12/29/2017   CALCIUM 9.4 12/29/2017   ANIONGAP 7 11/02/2017   GFR 27.05 (L) 12/29/2017   Lab Results  Component Value Date   CHOL 190 12/29/2017   Lab Results  Component Value Date   HDL 46.50 12/29/2017   Lab Results  Component Value Date   LDLCALC 111 (H) 12/29/2017   Lab Results  Component Value Date   TRIG 161.0 (H) 12/29/2017   Lab Results  Component Value Date   CHOLHDL 4 12/29/2017   Lab Results  Component Value Date   HGBA1C 6.1 12/29/2017         Assessment & Plan:   Problems Addressed this Visit  HTN: slightly elevated, but not abnormal for this patient. She does not complain of HA or dizziness and is doing well overall. Encouraged heart healthy diet and walking as tolerated, along with PT and some light weight bearing exercises. - Orders: TSH, CMP, CBC  Type 2 DM: Pt has decreased insulin from 30units to 5units per  day. Doing really well with sugars running between 95-107 fasting in the morning. Recheck today d/t change in medicine - Orders: HgB A1c  Hyperlipidemia: Most recent labs showed elevated LDL and triglyceride count. Encouraged heart healthy diet and walking. Rechecking today. - Orders: Lipid panel  Debility: Patient believes that PT has been helping with movement significantly and requests more. - Orders: amb referral to PT  Estrogen Deficiency: Post-menopausal preventative care. - Orders: Bone DEXA scan  Confusion: Encouraged pt's son to keep track of the days when she struggles with sundowning and try to establish a pattern for what triggers the  confusion some days and not others.  I have discontinued Kyndel F. Funches's insulin lispro and aspirin EC. I am also having her maintain her Fish Oil, nitroGLYCERIN, ipratropium, albuterol, isosorbide mononitrate, insulin glargine, amLODipine, thiamine, atorvastatin, citalopram, famotidine, hydrALAZINE, pantoprazole, clopidogrel, and levETIRAcetam.  No orders of the defined types were placed in this encounter.   Court Joy, Student-PA

## 2018-04-08 NOTE — Patient Instructions (Addendum)
Pnevnar  Shingrix is the shingles shot 2 shots over 2-6 months at pharmacy   Hypertension Hypertension, commonly called high blood pressure, is when the force of blood pumping through the arteries is too strong. The arteries are the blood vessels that carry blood from the heart throughout the body. Hypertension forces the heart to work harder to pump blood and may cause arteries to become narrow or stiff. Having untreated or uncontrolled hypertension can cause heart attacks, strokes, kidney disease, and other problems. A blood pressure reading consists of a higher number over a lower number. Ideally, your blood pressure should be below 120/80. The first ("top") number is called the systolic pressure. It is a measure of the pressure in your arteries as your heart beats. The second ("bottom") number is called the diastolic pressure. It is a measure of the pressure in your arteries as the heart relaxes. What are the causes? The cause of this condition is not known. What increases the risk? Some risk factors for high blood pressure are under your control. Others are not. Factors you can change  Smoking.  Having type 2 diabetes mellitus, high cholesterol, or both.  Not getting enough exercise or physical activity.  Being overweight.  Having too much fat, sugar, calories, or salt (sodium) in your diet.  Drinking too much alcohol. Factors that are difficult or impossible to change  Having chronic kidney disease.  Having a family history of high blood pressure.  Age. Risk increases with age.  Race. You may be at higher risk if you are African-American.  Gender. Men are at higher risk than women before age 24. After age 85, women are at higher risk than men.  Having obstructive sleep apnea.  Stress. What are the signs or symptoms? Extremely high blood pressure (hypertensive crisis) may cause:  Headache.  Anxiety.  Shortness of breath.  Nosebleed.  Nausea and  vomiting.  Severe chest pain.  Jerky movements you cannot control (seizures). How is this diagnosed? This condition is diagnosed by measuring your blood pressure while you are seated, with your arm resting on a surface. The cuff of the blood pressure monitor will be placed directly against the skin of your upper arm at the level of your heart. It should be measured at least twice using the same arm. Certain conditions can cause a difference in blood pressure between your right and left arms. Certain factors can cause blood pressure readings to be lower or higher than normal (elevated) for a short period of time:  When your blood pressure is higher when you are in a health care provider's office than when you are at home, this is called white coat hypertension. Most people with this condition do not need medicines.  When your blood pressure is higher at home than when you are in a health care provider's office, this is called masked hypertension. Most people with this condition may need medicines to control blood pressure. If you have a high blood pressure reading during one visit or you have normal blood pressure with other risk factors:  You may be asked to return on a different day to have your blood pressure checked again.  You may be asked to monitor your blood pressure at home for 1 week or longer. If you are diagnosed with hypertension, you may have other blood or imaging tests to help your health care provider understand your overall risk for other conditions. How is this treated? This condition is treated by making healthy lifestyle changes,  such as eating healthy foods, exercising more, and reducing your alcohol intake. Your health care provider may prescribe medicine if lifestyle changes are not enough to get your blood pressure under control, and if:  Your systolic blood pressure is above 130.  Your diastolic blood pressure is above 80. Your personal target blood pressure may vary  depending on your medical conditions, your age, and other factors. Follow these instructions at home: Eating and drinking   Eat a diet that is high in fiber and potassium, and low in sodium, added sugar, and fat. An example eating plan is called the DASH (Dietary Approaches to Stop Hypertension) diet. To eat this way: ? Eat plenty of fresh fruits and vegetables. Try to fill half of your plate at each meal with fruits and vegetables. ? Eat whole grains, such as whole wheat pasta, brown rice, or whole grain bread. Fill about one quarter of your plate with whole grains. ? Eat or drink low-fat dairy products, such as skim milk or low-fat yogurt. ? Avoid fatty cuts of meat, processed or cured meats, and poultry with skin. Fill about one quarter of your plate with lean proteins, such as fish, chicken without skin, beans, eggs, and tofu. ? Avoid premade and processed foods. These tend to be higher in sodium, added sugar, and fat.  Reduce your daily sodium intake. Most people with hypertension should eat less than 1,500 mg of sodium a day.  Limit alcohol intake to no more than 1 drink a day for nonpregnant women and 2 drinks a day for men. One drink equals 12 oz of beer, 5 oz of wine, or 1 oz of hard liquor. Lifestyle   Work with your health care provider to maintain a healthy body weight or to lose weight. Ask what an ideal weight is for you.  Get at least 30 minutes of exercise that causes your heart to beat faster (aerobic exercise) most days of the week. Activities may include walking, swimming, or biking.  Include exercise to strengthen your muscles (resistance exercise), such as pilates or lifting weights, as part of your weekly exercise routine. Try to do these types of exercises for 30 minutes at least 3 days a week.  Do not use any products that contain nicotine or tobacco, such as cigarettes and e-cigarettes. If you need help quitting, ask your health care provider.  Monitor your blood  pressure at home as told by your health care provider.  Keep all follow-up visits as told by your health care provider. This is important. Medicines  Take over-the-counter and prescription medicines only as told by your health care provider. Follow directions carefully. Blood pressure medicines must be taken as prescribed.  Do not skip doses of blood pressure medicine. Doing this puts you at risk for problems and can make the medicine less effective.  Ask your health care provider about side effects or reactions to medicines that you should watch for. Contact a health care provider if:  You think you are having a reaction to a medicine you are taking.  You have headaches that keep coming back (recurring).  You feel dizzy.  You have swelling in your ankles.  You have trouble with your vision. Get help right away if:  You develop a severe headache or confusion.  You have unusual weakness or numbness.  You feel faint.  You have severe pain in your chest or abdomen.  You vomit repeatedly.  You have trouble breathing. Summary  Hypertension is when the force  of blood pumping through your arteries is too strong. If this condition is not controlled, it may put you at risk for serious complications.  Your personal target blood pressure may vary depending on your medical conditions, your age, and other factors. For most people, a normal blood pressure is less than 120/80.  Hypertension is treated with lifestyle changes, medicines, or a combination of both. Lifestyle changes include weight loss, eating a healthy, low-sodium diet, exercising more, and limiting alcohol. This information is not intended to replace advice given to you by your health care provider. Make sure you discuss any questions you have with your health care provider. Document Released: 03/02/2005 Document Revised: 01/29/2016 Document Reviewed: 01/29/2016 Elsevier Interactive Patient Education  2019 [employer].

## 2018-04-10 NOTE — Assessment & Plan Note (Signed)
Encouraged heart healthy diet, increase exercise, avoid trans fats, consider a krill oil cap daily 

## 2018-04-10 NOTE — Assessment & Plan Note (Signed)
Well controlled, no changes to meds. Encouraged heart healthy diet such as the DASH diet and exercise as tolerated.  °

## 2018-04-10 NOTE — Assessment & Plan Note (Signed)
RRR today 

## 2018-04-10 NOTE — Assessment & Plan Note (Signed)
Well cared for at home by family. Has been wandering some and discussed strategies for keeping her safe

## 2018-04-10 NOTE — Assessment & Plan Note (Signed)
They felt PT was helpful for getting her moving and she is encouraged to stay as active as possible

## 2018-04-10 NOTE — Progress Notes (Signed)
Subjective:    Patient ID: Destiny [name], female    DOB: [birth year], 85 y.o.   MRN: [id number]  No chief complaint on file.   HPI Patient is in today for follow up accompanied by her son. Overall they report she is doing well at home. They do note some wandering at times and forgetting to eat. If they feed her she will eat but hates to move no recent febrile illness or hospitalizations. She denies any abominal pain or trouble swallowing. Denies CP/palp/SOB/HA/congestion/fevers/GI or GU c/o. Taking meds as prescribed  Past Medical History:  Diagnosis Date  . Acute renal insufficiency 12/24/2013  . Amaurosis fugax   . Arthritis 12/06/2012  . Atherosclerosis   . CAD (coronary artery disease)    a. s/p CABG x 4 (LIMA->LAD, VG->Diag, VG->OM1->OM3);  b. 2010 Cath: 3/4 patent grafts (VG->diag occluded), 3vd, sev PVD ->Med Rx;  c. 12/2011 Lexi MV: sm area of mild mid and apical ant wall ischemia ->med rx.  . Carotid bruit   . Chronic kidney disease (CKD) stage G4/A1, severely decreased glomerular filtration rate (GFR) between 15-29 mL/min/1.73 square meter and albuminuria creatinine ratio less than 30 mg/g (HCC)    GFR 25 on 12/25/13  . CVA (cerebral infarction)   . Dementia ([place])    pt denies  . Depression 12/24/2013  . Diabetes mellitus type II    insulin dependent.   . Diabetic peripheral neuropathy ([place])   . Diabetic retinopathy   . Diverticulosis   . Duodenal ulcer 2009 and 11/2011   caused GI bleeding  . Dyspnea   . Gallstones 2009   seen on CT scan  . GI bleed 04/2011, 11/2011   found non-bleeding duodenal ulcers  . Hyperlipidemia   . Hypersomnolence 03/12/2013  . Hypertension   . Iron deficiency anemia secondary to blood loss (chronic)   . Melena 1015/2015   PEPTIC ULCER    REQUIRING TRANSFUSION  . Pedal edema 10/12/2016  . PVD (peripheral vascular disease) ([place])   . Rib fracture 04/21/2015  . Stroke ([place])   . Thiamine deficiency 08/13/2015  . UTI (urinary tract infection)  04/08/2014  . Weight loss, non-intentional 01/29/2014    Past Surgical History:  Procedure Laterality Date  . ABDOMINAL HYSTERECTOMY    . COLONOSCOPY  05/20/2011   Procedure: COLONOSCOPY;  Surgeon: Scarlette Shorts, MD;  Location: [facility];  Service: Endoscopy;  Laterality: N/A;  . COLONOSCOPY WITH PROPOFOL N/A 06/09/2017   Procedure: COLONOSCOPY WITH PROPOFOL;  Surgeon: Milus Banister, MD;  Location: [facility] ENDOSCOPY;  Service: Endoscopy;  Laterality: N/A;  . CORONARY ARTERY BYPASS GRAFT  1999   x 4  . ENTEROSCOPY N/A 12/29/2013   Procedure: ENTEROSCOPY;  Surgeon: Inda Castle, MD;  Location: [facility];  Service: Endoscopy;  Laterality: N/A;  . ESOPHAGOGASTRODUODENOSCOPY  05/16/2011   Procedure: ESOPHAGOGASTRODUODENOSCOPY (EGD);  Surgeon: Gatha Mayer, MD;  Location[facility] ENDOSCOPY;  Service: Endoscopy;  Laterality: N/A;  . ESOPHAGOGASTRODUODENOSCOPY  12/08/2011   Procedure: ESOPHAGOGASTRODUODENOSCOPY (EGD);  Surgeon: Ladene Artist, MD,FACG;  Location: [facility] ENDOSCOPY;  Service: Endoscopy;  Laterality: N/A;  . ESOPHAGOGASTRODUODENOSCOPY (EGD) WITH PROPOFOL N/A 06/08/2017   Procedure: ESOPHAGOGASTRODUODENOSCOPY (EGD) WITH PROPOFOL;  Surgeon: Milus Banister, MD;  Location: [facility] ENDOSCOPY;  Service: Endoscopy;  Laterality: N/A;  . UPPER GASTROINTESTINAL ENDOSCOPY  02/22/2008   w/biopsy, duedenal ulcer, antrum erosions    Family History  Problem Relation Age of Onset  . Heart disease Son   . Melanoma Son   .  Coronary artery disease Father   . Hypertension Father   . Stroke Father   . Cancer Mother        ?  . Diabetes Maternal Uncle   . Stroke Sister   . Epilepsy Daughter   . Colon cancer Neg Hx     Social History   Socioeconomic History  . Marital status: Widowed    Spouse name: Not on file  . Number of children: 4  . Years of education: Not on file  . Highest education level: Not on file  Occupational History  . Occupation: retired    Fish farm manager: RETIRED  Social Needs  .  Financial resource strain: Not on file  . Food insecurity:    Worry: Not on file    Inability: Not on file  . Transportation needs:    Medical: Not on file    Non-medical: Not on file  Tobacco Use  . Smoking status: Former Smoker    Types: Cigarettes    Last attempt to quit: 12/31/2000    Years since quitting: 17.2  . Smokeless tobacco: Never Used  Substance and Sexual Activity  . Alcohol use: No  . Drug use: No  . Sexual activity: Not on file  Lifestyle  . Physical activity:    Days per week: Not on file    Minutes per session: Not on file  . Stress: Not on file  Relationships  . Social connections:    Talks on phone: Not on file    Gets together: Not on file    Attends religious service: Not on file    Active member of club or organization: Not on file    Attends meetings of clubs or organizations: Not on file    Relationship status: Not on file  . Intimate partner violence:    Fear of current or ex partner: Not on file    Emotionally abused: Not on file    Physically abused: Not on file    Forced sexual activity: Not on file  Other Topics Concern  . Not on file  Social History Narrative   Widowed - husband passed away in 05/11/09   lives with daughter with epilepsy - mental retardation   Retired     Former Smoker      Alcohol use-no          Occupation: Retired       son - Prisila Dlouhy     Outpatient Medications Prior to Visit  Medication Sig Dispense Refill  . albuterol (PROAIR HFA) 108 (90 Base) MCG/ACT inhaler Inhale 2 puffs into the lungs every 6 (six) hours as needed for wheezing or shortness of breath. 18 g 2  . amLODipine (NORVASC) 10 MG tablet Take 1 tablet (10 mg total) by mouth daily.    . clopidogrel (PLAVIX) 75 MG tablet Take 1 tablet (75 mg total) by mouth daily. 90 tablet 3  . insulin glargine (LANTUS) 100 UNIT/ML injection Inject 0.15 mLs (15 Units total) into the skin daily. 1500 mL 1  . ipratropium (ATROVENT HFA) 17 MCG/ACT inhaler Inhale 2  puffs into the lungs every 6 (six) hours as needed for wheezing. 1 Inhaler 12  . levETIRAcetam (KEPPRA) 500 MG tablet Take 1 tablet (500 mg total) by mouth 2 (two) times daily. 180 tablet 3  . nitroGLYCERIN (NITROSTAT) 0.4 MG SL tablet Place 0.4 mg under the tongue every 5 (five) minutes as needed for chest pain.    . Omega-3 Fatty Acids (FISH OIL)  1200 MG CAPS Take 1,200 mg by mouth daily.     . pantoprazole (PROTONIX) 40 MG tablet TAKE 1 TABLET BY MOUTH ONCE DAILY 90 tablet 0  . thiamine (VITAMIN B-1) 100 MG tablet 1 tab po M, W, F 30 tablet 5  . aspirin EC 81 MG tablet Take 1 tablet (81 mg total) by mouth daily.    Marland Kitchen atorvastatin (LIPITOR) 20 MG tablet TAKE 1 TABLET (20 MG TOTAL) BY MOUTH DAILY. 90 tablet 1  . citalopram (CELEXA) 20 MG tablet TAKE 1 TABLET (20 MG TOTAL) BY MOUTH DAILY. 90 tablet 1  . famotidine (PEPCID) 40 MG tablet Take 1 tablet (40 mg total) by mouth daily. 90 tablet 1  . hydrALAZINE (APRESOLINE) 25 MG tablet Take 1 tablet (25 mg total) by mouth 2 (two) times daily. 180 tablet 1  . insulin lispro (HUMALOG KWIKPEN) 100 UNIT/ML KiwkPen Inject 0.05-0.1 mLs (5-10 Units total) into the skin 3 (three) times daily with meals. 15 mL 4  . isosorbide mononitrate (IMDUR) 30 MG 24 hr tablet Take 1 tablet (30 mg total) by mouth at bedtime. (Patient taking differently: Take 30 mg by mouth daily. ) 90 tablet 1   No facility-administered medications prior to visit.     No Known Allergies  Review of Systems  Constitutional: Positive for malaise/fatigue. Negative for fever.  HENT: Negative for congestion.   Eyes: Negative for blurred vision.  Respiratory: Negative for shortness of breath.   Cardiovascular: Negative for chest pain, palpitations and leg swelling.  Gastrointestinal: Negative for abdominal pain, blood in stool and nausea.  Genitourinary: Negative for dysuria and frequency.  Musculoskeletal: Negative for falls.  Skin: Negative for rash.  Neurological: Negative for  dizziness, loss of consciousness and headaches.  Endo/Heme/Allergies: Negative for environmental allergies.  Psychiatric/Behavioral: Positive for depression, memory loss and suicidal ideas. The patient is not nervous/anxious.        Objective:    Physical Exam Constitutional:      General: She is not in acute distress.    Appearance: She is not diaphoretic.  HENT:     Head: Normocephalic and atraumatic.     Right Ear: External ear normal.     Left Ear: External ear normal.     Nose: Nose normal.     Mouth/Throat:     Pharynx: No oropharyngeal exudate.  Eyes:     General: No scleral icterus.       Right eye: No discharge.        Left eye: No discharge.     Conjunctiva/sclera: Conjunctivae normal.     Pupils: Pupils are equal, round, and reactive to light.  Neck:     Musculoskeletal: Normal range of motion and neck supple.     Thyroid: No thyromegaly.  Cardiovascular:     Rate and Rhythm: Regular rhythm.  Pulmonary:     Effort: Pulmonary effort is normal. No respiratory distress.     Breath sounds: Normal breath sounds. No wheezing or rales.  Abdominal:     General: Bowel sounds are normal. There is no distension.     Palpations: Abdomen is soft. There is no mass.     Tenderness: There is no abdominal tenderness.  Musculoskeletal: Normal range of motion.        General: No tenderness.  Lymphadenopathy:     Cervical: No cervical adenopathy.  Skin:    General: Skin is warm and dry.     Findings: No rash.  Neurological:     Mental  Status: She is alert and oriented to person, place, and time.     Cranial Nerves: No cranial nerve deficit.     Coordination: Coordination normal.     Deep Tendon Reflexes: Reflexes are normal and symmetric. Reflexes normal.     BP (!) 142/58 (BP Location: Left Arm, Patient Position: Sitting, Cuff Size: Normal)   Pulse (!) 53   Temp 97.8 F (36.6 C) (Oral)   Resp 18   Wt 145 lb 6.4 oz (66 kg)   SpO2 98%   BMI 22.77 kg/m  Wt Readings  from Last 3 Encounters:  04/08/18 145 lb 6.4 oz (66 kg)  03/02/18 147 lb 12.8 oz (67 kg)  12/28/17 142 lb (64.4 kg)     Lab Results  Component Value Date   WBC 7.6 04/08/2018   HGB 12.0 04/08/2018   HCT 36.2 04/08/2018   PLT 210.0 04/08/2018   GLUCOSE 124 (H) 04/08/2018   CHOL 200 04/08/2018   TRIG 142.0 04/08/2018   HDL 54.00 04/08/2018   LDLDIRECT 128.0 01/26/2017   LDLCALC 117 (H) 04/08/2018   ALT 10 04/08/2018   AST 13 04/08/2018   NA 141 04/08/2018   K 4.1 04/08/2018   CL 109 04/08/2018   CREATININE 2.28 (H) 04/08/2018   BUN 52 (H) 04/08/2018   CO2 21 04/08/2018   TSH 2.38 04/08/2018   INR 1.09 10/31/2017   HGBA1C 6.3 04/08/2018   MICROALBUR 97.4 (H) 11/26/2015    Lab Results  Component Value Date   TSH 2.38 04/08/2018   Lab Results  Component Value Date   WBC 7.6 04/08/2018   HGB 12.0 04/08/2018   HCT 36.2 04/08/2018   MCV 92.1 04/08/2018   PLT 210.0 04/08/2018   Lab Results  Component Value Date   NA 141 04/08/2018   K 4.1 04/08/2018   CO2 21 04/08/2018   GLUCOSE 124 (H) 04/08/2018   BUN 52 (H) 04/08/2018   CREATININE 2.28 (H) 04/08/2018   BILITOT 0.6 04/08/2018   ALKPHOS 74 04/08/2018   AST 13 04/08/2018   ALT 10 04/08/2018   PROT 6.7 04/08/2018   ALBUMIN 3.8 04/08/2018   CALCIUM 9.2 04/08/2018   ANIONGAP 7 11/02/2017   GFR 20.36 (L) 04/08/2018   Lab Results  Component Value Date   CHOL 200 04/08/2018   Lab Results  Component Value Date   HDL 54.00 04/08/2018   Lab Results  Component Value Date   LDLCALC 117 (H) 04/08/2018   Lab Results  Component Value Date   TRIG 142.0 04/08/2018   Lab Results  Component Value Date   CHOLHDL 4 04/08/2018   Lab Results  Component Value Date   HGBA1C 6.3 04/08/2018       Assessment & Plan:   Problem List Items Addressed This Visit    Essential hypertension - Primary (Chronic)    Well controlled, no changes to meds. Encouraged heart healthy diet such as the DASH diet and exercise as  tolerated.       Relevant Medications   atorvastatin (LIPITOR) 20 MG tablet   isosorbide mononitrate (IMDUR) 30 MG 24 hr tablet   hydrALAZINE (APRESOLINE) 25 MG tablet   Other Relevant Orders   CBC (Completed)   Comp Met (CMET) (Completed)   TSH (Completed)   Type 2 diabetes mellitus with diabetic neuropathy, unspecified (HCC)   Relevant Medications   atorvastatin (LIPITOR) 20 MG tablet   Other Relevant Orders   HgB A1c (Completed)   Hyperlipidemia    Encouraged  heart healthy diet, increase exercise, avoid trans fats, consider a krill oil cap daily      Relevant Medications   atorvastatin (LIPITOR) 20 MG tablet   isosorbide mononitrate (IMDUR) 30 MG 24 hr tablet   hydrALAZINE (APRESOLINE) 25 MG tablet   Other Relevant Orders   Lipid Profile (Completed)   Dementia ([place])    Well cared for at home by family. Has been wandering some and discussed strategies for keeping her safe      Relevant Medications   citalopram (CELEXA) 20 MG tablet   Debility    They felt PT was helpful for getting her moving and she is encouraged to stay as active as possible      Relevant Orders   Ambulatory referral to Physical Therapy   Atrial fibrillation with RVR ([place])    RRR today      Relevant Medications   atorvastatin (LIPITOR) 20 MG tablet   isosorbide mononitrate (IMDUR) 30 MG 24 hr tablet   hydrALAZINE (APRESOLINE) 25 MG tablet    Other Visit Diagnoses    Depression with anxiety       Relevant Medications   citalopram (CELEXA) 20 MG tablet   Estrogen deficiency       Relevant Orders   DG Bone Density      I have discontinued Eira F. Randhawa's insulin lispro and aspirin EC. I am also having her maintain her Fish Oil, nitroGLYCERIN, ipratropium, albuterol, insulin glargine, amLODipine, thiamine, pantoprazole, clopidogrel, levETIRAcetam, famotidine, atorvastatin, isosorbide mononitrate, citalopram, and hydrALAZINE.  Meds ordered this encounter  Medications  . famotidine  (PEPCID) 40 MG tablet    Sig: Take 1 tablet (40 mg total) by mouth daily.    Dispense:  90 tablet    Refill:  1  . atorvastatin (LIPITOR) 20 MG tablet    Sig: Take 1 tablet (20 mg total) by mouth daily.    Dispense:  90 tablet    Refill:  1  . isosorbide mononitrate (IMDUR) 30 MG 24 hr tablet    Sig: Take 1 tablet (30 mg total) by mouth at bedtime.    Dispense:  90 tablet    Refill:  1  . citalopram (CELEXA) 20 MG tablet    Sig: Take 1 tablet (20 mg total) by mouth daily.    Dispense:  90 tablet    Refill:  1  . hydrALAZINE (APRESOLINE) 25 MG tablet    Sig: Take 1 tablet (25 mg total) by mouth 2 (two) times daily.    Dispense:  180 tablet    Refill:  1     Penni Homans, MD

## 2018-04-12 ENCOUNTER — Telehealth: Payer: Self-pay | Admitting: Family Medicine

## 2018-04-12 NOTE — Telephone Encounter (Signed)
Copied from [place] [id number]. Topic: Quick Communication - See Telephone Encounter >> Apr 12, 2018  9:48 AM Rutherford Nail, NT wrote: CRM for notification. See Telephone encounter for: 04/12/18. Margaret with [facility] and states that they received a referral for physical therapy, but information that was sent over did not include the patient's last office visit date and notes. Please advise. Will need this sent before they can go out and evaluate patient today.  CB#: [phone] Fax#: [phone] Cathlean Sauer

## 2018-04-12 NOTE — Addendum Note (Signed)
Addended by: Magdalene Molly A on: 04/12/2018 08:26 AM   Modules accepted: Orders

## 2018-04-13 MED FILL — CLOPIDOGREL 75 MG TABLET: 75 | 90 days supply | Qty: 90 | Fill #0

## 2018-04-13 NOTE — Telephone Encounter (Signed)
Last OV notes faxed to number provided.

## 2018-04-14 ENCOUNTER — Telehealth: Payer: Self-pay

## 2018-04-14 NOTE — Telephone Encounter (Signed)
Copied from [place] [phone]. Topic: Quick Communication - Home Health Verbal Orders >> Apr 14, 2018 11:51 AM Ivar Drape wrote: Caller/Agency:   Sheryn Bison PT w/[facility] Number:   [phone]  He wanted to let the provider know that he discharged the patient two weeks ago.  The son said the patient is doing fine.  So they are not going to do PT at this time.  They don't think she needs it right now.  She might later, but not now.    FYi

## 2018-04-15 NOTE — Telephone Encounter (Signed)
Noted  

## 2018-04-17 ENCOUNTER — Encounter: Payer: Self-pay | Admitting: Podiatry

## 2018-04-17 NOTE — Progress Notes (Signed)
Subjective: Destiny [name] presents today with painful, thick toenails 1-5 b/l that she cannot cut and which interfere with daily activities.  Pain is aggravated when wearing enclosed shoe gear.  Mosie Lukes, MD is her PCP.   Current Outpatient Medications:  .  albuterol (PROAIR HFA) 108 (90 Base) MCG/ACT inhaler, Inhale 2 puffs into the lungs every 6 (six) hours as needed for wheezing or shortness of breath., Disp: 18 g, Rfl: 2 .  amLODipine (NORVASC) 10 MG tablet, Take 1 tablet (10 mg total) by mouth daily., Disp: , Rfl:  .  clopidogrel (PLAVIX) 75 MG tablet, Take 1 tablet (75 mg total) by mouth daily., Disp: 90 tablet, Rfl: 3 .  insulin glargine (LANTUS) 100 UNIT/ML injection, Inject 0.15 mLs (15 Units total) into the skin daily., Disp: 1500 mL, Rfl: 1 .  ipratropium (ATROVENT HFA) 17 MCG/ACT inhaler, Inhale 2 puffs into the lungs every 6 (six) hours as needed for wheezing., Disp: 1 Inhaler, Rfl: 12 .  levETIRAcetam (KEPPRA) 500 MG tablet, Take 1 tablet (500 mg total) by mouth 2 (two) times daily., Disp: 180 tablet, Rfl: 3 .  nitroGLYCERIN (NITROSTAT) 0.4 MG SL tablet, Place 0.4 mg under the tongue every 5 (five) minutes as needed for chest pain., Disp: , Rfl:  .  Omega-3 Fatty Acids (FISH OIL) 1200 MG CAPS, Take 1,200 mg by mouth daily. , Disp: , Rfl:  .  pantoprazole (PROTONIX) 40 MG tablet, TAKE 1 TABLET BY MOUTH ONCE DAILY, Disp: 90 tablet, Rfl: 0 .  thiamine (VITAMIN B-1) 100 MG tablet, 1 tab po M, W, F, Disp: 30 tablet, Rfl: 5 .  atorvastatin (LIPITOR) 20 MG tablet, Take 1 tablet (20 mg total) by mouth daily., Disp: 90 tablet, Rfl: 1 .  citalopram (CELEXA) 20 MG tablet, Take 1 tablet (20 mg total) by mouth daily., Disp: 90 tablet, Rfl: 1 .  famotidine (PEPCID) 40 MG tablet, Take 1 tablet (40 mg total) by mouth daily., Disp: 90 tablet, Rfl: 1 .  hydrALAZINE (APRESOLINE) 25 MG tablet, Take 1 tablet (25 mg total) by mouth 2 (two) times daily., Disp: 180 tablet, Rfl: 1 .  isosorbide  mononitrate (IMDUR) 30 MG 24 hr tablet, Take 1 tablet (30 mg total) by mouth at bedtime., Disp: 90 tablet, Rfl: 1  No Known Allergies  Objective:  Vascular Examination: Capillary refill time immediate x 10 digits  Dorsalis pedis 2/4 b/l  Posterior tibial pulses1/4 b/l  Digital hair present x 10 digits  Skin temperature gradient WNL b/l  Dermatological Examination: Skin with normal turgor, texture and tone b/l  Toenails 1-5 b/l discolored, thick, dystrophic with subungual debris and pain with palpation to nailbeds due to thickness of nails.  Musculoskeletal: Muscle strength 5/5 to all LE muscle groups  No gross bony deformities b/l.  No pain, crepitus or joint limitation noted with ROM.   Neurological: Sensation decreased with 10 gram monofilament b/l Vibratory sensation decreased b/l.  Assessment: Painful onychomycosis toenails 1-5 b/l  NIDDM with neuropathy  Plan: 1. Toenails 1-5 b/l were debrided in length and girth without iatrogenic bleeding. 2. Patient to continue soft, supportive shoe gear 3. Patient to report any pedal injuries to medical professional immediately. 4. Follow up 3 months. Patient/POA to call should there be a concern in the interim.

## 2018-04-18 ENCOUNTER — Other Ambulatory Visit (HOSPITAL_BASED_OUTPATIENT_CLINIC_OR_DEPARTMENT_OTHER): Payer: Medicare Other

## 2018-04-18 ENCOUNTER — Encounter: Payer: Self-pay | Admitting: Family Medicine

## 2018-04-18 DIAGNOSIS — N184 Chronic kidney disease, stage 4 (severe): Secondary | ICD-10-CM | POA: Diagnosis not present

## 2018-04-18 DIAGNOSIS — I129 Hypertensive chronic kidney disease with stage 1 through stage 4 chronic kidney disease, or unspecified chronic kidney disease: Secondary | ICD-10-CM | POA: Diagnosis not present

## 2018-04-19 ENCOUNTER — Encounter: Payer: Self-pay | Admitting: Family Medicine

## 2018-04-20 ENCOUNTER — Ambulatory Visit (INDEPENDENT_AMBULATORY_CARE_PROVIDER_SITE_OTHER): Payer: Medicare Other | Admitting: Family Medicine

## 2018-04-20 ENCOUNTER — Ambulatory Visit (HOSPITAL_BASED_OUTPATIENT_CLINIC_OR_DEPARTMENT_OTHER)
Admission: RE | Admit: 2018-04-20 | Discharge: 2018-04-20 | Disposition: A | Discharge status: Home / Self Care | Payer: Medicare Other | Source: Ambulatory Visit | Attending: Family Medicine | Admitting: Family Medicine

## 2018-04-20 ENCOUNTER — Encounter: Payer: Self-pay | Admitting: Family Medicine

## 2018-04-20 VITALS — BP 140/64 | HR 57 | Temp 98.0°F | Ht 66.0 in | Wt 144.2 lb

## 2018-04-20 DIAGNOSIS — M25531 Pain in right wrist: Secondary | ICD-10-CM

## 2018-04-20 DIAGNOSIS — S6991XA Unspecified injury of right wrist, hand and finger(s), initial encounter: Secondary | ICD-10-CM | POA: Diagnosis not present

## 2018-04-20 DIAGNOSIS — M25561 Pain in right knee: Secondary | ICD-10-CM | POA: Diagnosis not present

## 2018-04-20 DIAGNOSIS — W19XXXA Unspecified fall, initial encounter: Secondary | ICD-10-CM | POA: Diagnosis not present

## 2018-04-20 DIAGNOSIS — M25562 Pain in left knee: Secondary | ICD-10-CM | POA: Diagnosis not present

## 2018-04-20 DIAGNOSIS — S8992XA Unspecified injury of left lower leg, initial encounter: Secondary | ICD-10-CM | POA: Diagnosis not present

## 2018-04-20 MED ORDER — GLUCOSE BLOOD VI STRP: ORAL_STRIP | 12 refills | Status: AC

## 2018-04-20 MED FILL — ACCU-CHEK GUIDE STRP: 90 days supply | Qty: 300 | Fill #0

## 2018-04-20 NOTE — Progress Notes (Addendum)
Chief Complaint  Patient presents with  . Fall    monday    Subjective: Patient is a 85 y.o. female here for a fall. Here w son Destiny [name] who helps provide the history.  Pt fell 2 days ago. Got up from dinner table and feel face forward, allegedly on R side. Currently having R wrist and R knee pain.  She is having some bruising on the right wrist.  No bruising or swelling in the lower extremities from the fall.  ROS: MSK: As noted in HPI  Past Medical History:  Diagnosis Date  . Acute renal insufficiency 12/24/2013  . Amaurosis fugax   . Arthritis 12/06/2012  . Atherosclerosis   . CAD (coronary artery disease)    a. s/p CABG x 4 (LIMA->LAD, VG->Diag, VG->OM1->OM3);  b. 2010 Cath: 3/4 patent grafts (VG->diag occluded), 3vd, sev PVD ->Med Rx;  c. 12/2011 Lexi MV: sm area of mild mid and apical ant wall ischemia ->med rx.  . Carotid bruit   . Chronic kidney disease (CKD) stage G4/A1, severely decreased glomerular filtration rate (GFR) between 15-29 mL/min/1.73 square meter and albuminuria creatinine ratio less than 30 mg/g (HCC)    GFR 25 on 12/25/13  . CVA (cerebral infarction)   . Dementia ([place])    pt denies  . Depression 12/24/2013  . Diabetes mellitus type II    insulin dependent.   . Diabetic peripheral neuropathy ([place])   . Diabetic retinopathy   . Diverticulosis   . Duodenal ulcer 2009 and 11/2011   caused GI bleeding  . Dyspnea   . Gallstones 2009   seen on CT scan  . GI bleed 04/2011, 11/2011   found non-bleeding duodenal ulcers  . Hyperlipidemia   . Hypersomnolence 03/12/2013  . Hypertension   . Iron deficiency anemia secondary to blood loss (chronic)   . Melena 1015/2015   PEPTIC ULCER    REQUIRING TRANSFUSION  . Pedal edema 10/12/2016  . PVD (peripheral vascular disease) ([place])   . Rib fracture 04/21/2015  . Stroke ([place])   . Thiamine deficiency 08/13/2015  . Weight loss, non-intentional 01/29/2014    Objective: BP 140/64 (BP Location: Left Arm, Patient Position:  Sitting, Cuff Size: Normal)   Pulse (!) 57   Temp 98 F (36.7 C) (Oral)   Ht 5\' 6"  (1.676 m)   Wt 144 lb 4 oz (65.4 kg)   SpO2 96%   BMI 23.28 kg/m  General: Awake, appears stated age MSK: There is edema and ecchymosis of the right wrist.  She is tender to palpation over the dorsal carpal row and distal radius, there is also tenderness to palpation over the volar first metacarpal; I do not appreciate any deformity; there is mild tenderness to palpation over the distal left patella.  No other joint line tenderness or deformity.  There is no effusion or edema in the lower extremities.  Negative Lachman's, varus/valgus stress, Stines Neuro: Sensation intact Heart: Brisk cap refill Lungs: No accessory muscle use Psych: Limited judgment and insight  Assessment and Plan: Fall, initial encounter - Plan: DG Wrist Complete Right, DG Knee AP/LAT W/Sunrise Left, DG Knee AP/LAT W/Sunrise Right  Pain in both knees, unspecified chronicity - Plan: DG Knee AP/LAT W/Sunrise Left, DG Knee AP/LAT W/Sunrise Right  Right wrist pain - Plan: DG Wrist Complete Right  Imaging was obtained. Unofficially, I do not see any acute fx or dislocation. There are degen changes noted. Await official read.  Splint, stretches/exercises, activity as tolerated, Tylenol. F/u prn.  The patient's son voiced understanding and agreement to the plan.  [place], DO 04/25/18  12:04 PM

## 2018-04-20 NOTE — Patient Instructions (Addendum)
Ice/cold pack over area for 10-15 min twice daily.  OK to take Tylenol 1000 mg (2 extra strength tabs) or 975 mg (3 regular strength tabs) every 6 hours as needed.  Let us know if you need anything.  Don't do any stretches/exercises that are too painful. I want to keep range of motion.  Activity as tolerated, no bed rest.   We will be in touch regarding the official X-ray results.   Wrist and Forearm Exercises Do exercises exactly as told by your health care provider and adjust them as directed. It is normal to feel mild stretching, pulling, tightness, or discomfort as you do these exercises, but you should stop right away if you feel sudden pain or your pain gets worse.   RANGE OF MOTION EXERCISES These exercises warm up your muscles and joints and improve the movement and flexibility of your injured wrist and forearm. These exercises also help to relieve pain, numbness, and tingling. These exercises are done using the muscles in your injured wrist and forearm. Exercise A: Wrist Flexion, Active 1. With your fingers relaxed, bend your wrist forward as far as you can. 2. Hold this position for 30 seconds. Repeat 2 times. Complete this exercise 3 times per week. Exercise B: Wrist Extension, Active 1. With your fingers relaxed, bend your wrist backward as far as you can. 2. Hold this position for 30 seconds. Repeat 2 times. Complete this exercise 3 times per week. Exercise C: Supination, Active  1. Stand or sit with your arms at your sides. 2. Bend your left / right elbow to an "L" shape (90 degrees). 3. Turn your palm upward until you feel a gentle stretch on the inside of your forearm. 4. Hold this position for 30 seconds. 5. Slowly return your palm to the starting position. Repeat 2 times. Complete this exercise 3 times per week. Exercise D: Pronation, Active  1. Stand or sit with your arms at your sides. 2. Bend your left / right elbow to an "L" shape (90 degrees). 3. Turn your  palm downward until you feel a gentle stretch on the top of your forearm. 4. Hold this position for 30 seconds. 5. Slowly return your palm to the starting position. Repeat 2 times. Complete this exercise once a day.  STRETCHING EXERCISES These exercises warm up your muscles and joints and improve the movement and flexibility of your injured wrist and forearm. These exercises also help to relieve pain, numbness, and tingling. These exercises are done using your healthy wrist and forearm to help stretch the muscles in your injured wrist and forearm. Exercise E: Wrist Flexion, Passive  1. Extend your left / right arm in front of you, relax your wrist, and point your fingers downward. 2. Gently push on the back of your hand. Stop when you feel a gentle stretch on the top of your forearm. 3. Hold this position for 30 seconds. Repeat 2 times. Complete this exercise 3 times per week. Exercise F: Wrist Extension, Passive  1. Extend your left / right arm in front of you and turn your palm upward. 2. Gently pull your palm and fingertips back so your fingers point downward. You should feel a gentle stretch on the palm-side of your forearm. 3. Hold this position for 30 seconds. Repeat 2 times. Complete this exercise 3 times per week. Exercise G: Forearm Rotation, Supination, Passive 1. Sit with your left / right elbow bent to an "L" shape (90 degrees) with your forearm resting on a  table. 2. Keeping your upper body and shoulder still, use your other hand to rotate your forearm palm-up until you feel a gentle to moderate stretch. 3. Hold this position for 30 seconds. 4. Slowly release the stretch and return to the starting position. Repeat 2 times. Complete this exercise 3 times per week. Exercise H: Forearm Rotation, Pronation, Passive 1. Sit with your left / right elbow bent to an "L" shape (90 degrees) with your forearm resting on a table. 2. Keeping your upper body and shoulder still, use your  other hand to rotate your forearm palm-down until you feel a gentle to moderate stretch. 3. Hold this position for 30 seconds. 4. Slowly release the stretch and return to the starting position. Repeat 2 times. Complete this exercise 3 times per week.  STRENGTHENING EXERCISES These exercises build strength and endurance in your wrist and forearm. Endurance is the ability to use your muscles for a long time, even after they get tired. Exercise I: Wrist Flexors  1. Sit with your left / right forearm supported on a table and your hand resting palm-up over the edge of the table. Your elbow should be bent to an "L" shape (about 90 degrees) and be below the level of your shoulder. 2. Hold a 3-5 lb weight in your left / right hand. Or, hold a rubber exercise band or tube in both hands, keeping your hands at the same level and hip distance apart. There should be a slight tension in the exercise band or tube. 3. Slowly curl your hand up toward your forearm. 4. Hold this position for 3 seconds. 5. Slowly lower your hand back to the starting position. Repeat 2 times. Complete this exercise 3 times per week. Exercise J: Wrist Extensors  1. Sit with your left / right forearm supported on a table and your hand resting palm-down over the edge of the table. Your elbow should be bent to an "L" shape (about 90 degrees) and be below the level of your shoulder. 2. Hold a 3-5 lb weight in your left / right hand. Or, hold a rubber exercise band or tube in both hands, keeping your hands at the same level and hip distance apart. There should be a slight tension in the exercise band or tube. 3. Slowly curl your hand up toward your forearm. 4. Hold this position for 3 seconds. 5. Slowly lower your hand back to the starting position. Repeat 2 times. Complete this exercise 3 times per week. Exercise K: Forearm Rotation, Supination  1. Sit with your left / right forearm supported on a table and your hand resting  palm-down. Your elbow should be at your side, bent to an "L" shape (about 90 degrees), and below the level of your shoulder. Keep your wrist stable and in a neutral position throughout the exercise. 2. Gently hold a lightweight hammer with your left / right hand. 3. Without moving your elbow or wrist, slowly rotate your palm upward to a thumbs-up position. 4. Hold this position for 3 seconds. 5. Slowly return your forearm to the starting position. Repeat 2 times. Complete this exercise 3 times per week. Exercise L: Forearm Rotation, Pronation  1. Sit with your left / right forearm supported on a table and your hand resting palm-up. Your elbow should be at your side, bent to an "L" shape (about 90 degrees), and below the level of your shoulder. Keep your wrist stable. Do not allow it to move backward or forward during the exercise.  2. Gently hold a lightweight hammer with your left / right hand. 3. Without moving your elbow or wrist, slowly rotate your palm and hand upward to a thumbs-up position. 4. Hold this position for 3 seconds. 5. Slowly return your forearm to the starting position. Repeat 2 times. Complete this exercise 3 times per week. Exercise M: Grip Strengthening  1. Hold one of these items in your left / right hand: play dough, therapy putty, a dense sponge, a stress ball, or a large, rolled sock. 2. Squeeze as hard as you can without increasing pain. 3. Hold this position for 5 seconds. 4. Slowly release your grip. Repeat 2 times. Complete this exercise 3 times per week.  This information is not intended to replace advice given to you by your health care provider. Make sure you discuss any questions you have with your health care provider. Document Released: 01/14/2005 Document Revised: 11/25/2015 Document Reviewed: 11/25/2014 Elsevier Interactive Patient Education  [employer].

## 2018-04-21 ENCOUNTER — Encounter: Payer: Self-pay | Admitting: Family Medicine

## 2018-04-29 ENCOUNTER — Telehealth: Payer: Self-pay | Admitting: *Deleted

## 2018-04-29 NOTE — Telephone Encounter (Signed)
Received Home Health Discharge-Transfer Summary for review from [facility]; forwarded to provider/SLS 02/14

## 2018-05-05 ENCOUNTER — Telehealth: Payer: Self-pay

## 2018-05-05 NOTE — Telephone Encounter (Signed)
Copied from [place] [phone]. Topic: General - Other >> May 05, 2018  2:49 PM Bea Graff, NT wrote: Reason for CRM: Son calling to state [facility] will be sending over a form to be completed for his mom to be enrolled there.

## 2018-05-09 ENCOUNTER — Telehealth: Payer: Self-pay | Admitting: *Deleted

## 2018-05-09 MED FILL — hydrALAZINE HCL 25 MG TABS: 25 | 90 days supply | Qty: 180 | Fill #1

## 2018-05-09 NOTE — Telephone Encounter (Signed)
Received Physician Orders for Adult Day Care/Day Health Medical Examination report from [facility]; forwarded to provider/SLS 02/24  Faxed with Confirmation/SLS 02/24

## 2018-05-24 ENCOUNTER — Other Ambulatory Visit: Payer: Self-pay | Admitting: Family Medicine

## 2018-05-24 MED FILL — levETIRAcetam 500 MG TABS: 500 | 30 days supply | Qty: 60 | Fill #2 | Status: TO

## 2018-05-26 ENCOUNTER — Other Ambulatory Visit: Payer: Self-pay | Admitting: Family Medicine

## 2018-06-01 ENCOUNTER — Other Ambulatory Visit: Payer: Self-pay | Admitting: Family Medicine

## 2018-06-02 MED FILL — AMLODIPINE BESYLATE 5 MG TA: 5 | 90 days supply | Qty: 90 | Fill #0

## 2018-06-18 ENCOUNTER — Other Ambulatory Visit: Payer: Self-pay | Admitting: Family Medicine

## 2018-06-18 MED FILL — ISOSORBIDE MN ER 30 MG TAB: 30 | 90 days supply | Qty: 90 | Fill #0

## 2018-06-18 MED FILL — ATORVASTATIN 20 MG TABLET: 20 | 90 days supply | Qty: 90 | Fill #0

## 2018-06-18 MED FILL — FAMOTIDINE 40 MG TABLET: 40 | 90 days supply | Qty: 90 | Fill #0

## 2018-06-18 MED FILL — CITALOPRAM HBR 20 MG TABLET: 20 | 90 days supply | Qty: 90 | Fill #0

## 2018-06-21 MED FILL — PANTOPRAZOLE SOD DR 40 MG T: 40 | 90 days supply | Qty: 90 | Fill #0

## 2018-06-29 ENCOUNTER — Telehealth: Payer: Self-pay | Admitting: *Deleted

## 2018-06-29 ENCOUNTER — Ambulatory Visit: Payer: Medicare Other | Admitting: Podiatry

## 2018-06-29 NOTE — Telephone Encounter (Signed)
Received Home Health Certification and Plan of Care; forwarded to provider/SLS 04/15  

## 2018-07-04 MED FILL — CLOPIDOGREL 75 MG TABLET: 75 | 90 days supply | Qty: 90 | Fill #0

## 2018-07-11 ENCOUNTER — Other Ambulatory Visit: Payer: Self-pay

## 2018-08-02 MED FILL — hydrALAZINE HCL 25 MG TABS: 25 | 90 days supply | Qty: 180 | Fill #0

## 2018-08-03 ENCOUNTER — Telehealth: Payer: Self-pay | Admitting: Adult Health

## 2018-08-03 ENCOUNTER — Other Ambulatory Visit: Payer: Self-pay

## 2018-08-03 MED ORDER — LEVETIRACETAM ER 500 MG PO TB24: 500.0000 mg | ORAL_TABLET | Freq: Every day | ORAL | 1 refills | Status: AC

## 2018-08-03 MED FILL — LEVETIRACETAM ER 500 MG TAB: 500 | 30 days supply | Qty: 30 | Fill #0

## 2018-08-03 NOTE — Telephone Encounter (Signed)
I called [name] to state keppra will be change back to keppra XR 500 per Janett Billow NP recommendation. I did new rx and sent to [facility]. While on the phone [name] did see the new 500 XR medication receive. She appreciate the call.

## 2018-08-03 NOTE — Telephone Encounter (Signed)
I called Destiny [name] at [facility]. She stated the family has been given pt the plain keppra once a day. The pt was XR keppra  In 11/2017 but the medication was on back order. Pt has been taking plain keppra since 02/2018, but the order is for twice a day. I stated pt was seen by Janett Billow NP in 02/2018 for visit and no phone calls or my chart have been sent from the pt or family.Destiny [name] stated daughter wants pt change back to XR keppra because its once a day.I stated Janett Billow NP will be sent message to evaluate.

## 2018-08-03 NOTE — Telephone Encounter (Signed)
Alicia [email or web address] - has called stating pt's daughter would like to know if pt could go to the xr Keppra since it is no longer on back order. [name] can be reached at [phone]

## 2018-08-03 NOTE — Telephone Encounter (Signed)
If XR is no longer under shortage, please change order back to Keppra XR 500 mg daily.  Thank you.

## 2018-08-20 ENCOUNTER — Inpatient Hospital Stay (HOSPITAL_COMMUNITY)
Admission: EM | Admit: 2018-08-20 | Discharge: 2018-09-14 | DRG: 065 | Disposition: E | Payer: Medicare Other | Attending: Internal Medicine | Admitting: Internal Medicine

## 2018-08-20 ENCOUNTER — Emergency Department (HOSPITAL_COMMUNITY): Payer: Medicare Other

## 2018-08-20 ENCOUNTER — Encounter (HOSPITAL_COMMUNITY): Payer: Self-pay | Admitting: Emergency Medicine

## 2018-08-20 ENCOUNTER — Other Ambulatory Visit: Payer: Self-pay

## 2018-08-20 DIAGNOSIS — R270 Ataxia, unspecified: Secondary | ICD-10-CM | POA: Diagnosis present

## 2018-08-20 DIAGNOSIS — G459 Transient cerebral ischemic attack, unspecified: Secondary | ICD-10-CM | POA: Diagnosis not present

## 2018-08-20 DIAGNOSIS — R410 Disorientation, unspecified: Secondary | ICD-10-CM | POA: Diagnosis not present

## 2018-08-20 DIAGNOSIS — E785 Hyperlipidemia, unspecified: Secondary | ICD-10-CM | POA: Diagnosis present

## 2018-08-20 DIAGNOSIS — Z8249 Family history of ischemic heart disease and other diseases of the circulatory system: Secondary | ICD-10-CM

## 2018-08-20 DIAGNOSIS — Z79899 Other long term (current) drug therapy: Secondary | ICD-10-CM

## 2018-08-20 DIAGNOSIS — Z794 Long term (current) use of insulin: Secondary | ICD-10-CM | POA: Diagnosis not present

## 2018-08-20 DIAGNOSIS — E861 Hypovolemia: Secondary | ICD-10-CM | POA: Diagnosis not present

## 2018-08-20 DIAGNOSIS — Z66 Do not resuscitate: Secondary | ICD-10-CM | POA: Diagnosis present

## 2018-08-20 DIAGNOSIS — E11319 Type 2 diabetes mellitus with unspecified diabetic retinopathy without macular edema: Secondary | ICD-10-CM | POA: Diagnosis present

## 2018-08-20 DIAGNOSIS — I358 Other nonrheumatic aortic valve disorders: Secondary | ICD-10-CM | POA: Diagnosis not present

## 2018-08-20 DIAGNOSIS — Z951 Presence of aortocoronary bypass graft: Secondary | ICD-10-CM | POA: Diagnosis not present

## 2018-08-20 DIAGNOSIS — Z1159 Encounter for screening for other viral diseases: Secondary | ICD-10-CM | POA: Diagnosis not present

## 2018-08-20 DIAGNOSIS — I10 Essential (primary) hypertension: Secondary | ICD-10-CM | POA: Diagnosis present

## 2018-08-20 DIAGNOSIS — E1122 Type 2 diabetes mellitus with diabetic chronic kidney disease: Secondary | ICD-10-CM | POA: Diagnosis not present

## 2018-08-20 DIAGNOSIS — I2510 Atherosclerotic heart disease of native coronary artery without angina pectoris: Secondary | ICD-10-CM | POA: Diagnosis not present

## 2018-08-20 DIAGNOSIS — R531 Weakness: Secondary | ICD-10-CM

## 2018-08-20 DIAGNOSIS — N179 Acute kidney failure, unspecified: Secondary | ICD-10-CM | POA: Diagnosis not present

## 2018-08-20 DIAGNOSIS — I130 Hypertensive heart and chronic kidney disease with heart failure and stage 1 through stage 4 chronic kidney disease, or unspecified chronic kidney disease: Secondary | ICD-10-CM | POA: Diagnosis not present

## 2018-08-20 DIAGNOSIS — E1142 Type 2 diabetes mellitus with diabetic polyneuropathy: Secondary | ICD-10-CM | POA: Diagnosis not present

## 2018-08-20 DIAGNOSIS — I6329 Cerebral infarction due to unspecified occlusion or stenosis of other precerebral arteries: Secondary | ICD-10-CM | POA: Diagnosis present

## 2018-08-20 DIAGNOSIS — I5032 Chronic diastolic (congestive) heart failure: Secondary | ICD-10-CM | POA: Diagnosis not present

## 2018-08-20 DIAGNOSIS — R29702 NIHSS score 2: Secondary | ICD-10-CM | POA: Diagnosis not present

## 2018-08-20 DIAGNOSIS — E860 Dehydration: Secondary | ICD-10-CM | POA: Diagnosis present

## 2018-08-20 DIAGNOSIS — Z87891 Personal history of nicotine dependence: Secondary | ICD-10-CM | POA: Diagnosis not present

## 2018-08-20 DIAGNOSIS — I480 Paroxysmal atrial fibrillation: Secondary | ICD-10-CM | POA: Diagnosis not present

## 2018-08-20 DIAGNOSIS — I6389 Other cerebral infarction: Principal | ICD-10-CM | POA: Diagnosis present

## 2018-08-20 DIAGNOSIS — R0902 Hypoxemia: Secondary | ICD-10-CM | POA: Diagnosis not present

## 2018-08-20 DIAGNOSIS — R404 Transient alteration of awareness: Secondary | ICD-10-CM | POA: Diagnosis not present

## 2018-08-20 DIAGNOSIS — Z8673 Personal history of transient ischemic attack (TIA), and cerebral infarction without residual deficits: Secondary | ICD-10-CM

## 2018-08-20 DIAGNOSIS — N184 Chronic kidney disease, stage 4 (severe): Secondary | ICD-10-CM | POA: Diagnosis not present

## 2018-08-20 DIAGNOSIS — E119 Type 2 diabetes mellitus without complications: Secondary | ICD-10-CM | POA: Diagnosis not present

## 2018-08-20 DIAGNOSIS — J9811 Atelectasis: Secondary | ICD-10-CM | POA: Diagnosis not present

## 2018-08-20 DIAGNOSIS — F329 Major depressive disorder, single episode, unspecified: Secondary | ICD-10-CM | POA: Diagnosis present

## 2018-08-20 DIAGNOSIS — Z823 Family history of stroke: Secondary | ICD-10-CM

## 2018-08-20 DIAGNOSIS — R000 Tachycardia, unspecified: Secondary | ICD-10-CM | POA: Diagnosis not present

## 2018-08-20 DIAGNOSIS — Z7902 Long term (current) use of antithrombotics/antiplatelets: Secondary | ICD-10-CM

## 2018-08-20 DIAGNOSIS — G40909 Epilepsy, unspecified, not intractable, without status epilepticus: Secondary | ICD-10-CM | POA: Diagnosis present

## 2018-08-20 DIAGNOSIS — I348 Other nonrheumatic mitral valve disorders: Secondary | ICD-10-CM | POA: Diagnosis not present

## 2018-08-20 DIAGNOSIS — I672 Cerebral atherosclerosis: Secondary | ICD-10-CM | POA: Diagnosis present

## 2018-08-20 DIAGNOSIS — F0150 Vascular dementia without behavioral disturbance: Secondary | ICD-10-CM | POA: Diagnosis not present

## 2018-08-20 DIAGNOSIS — F0390 Unspecified dementia without behavioral disturbance: Secondary | ICD-10-CM | POA: Diagnosis present

## 2018-08-20 LAB — BASIC METABOLIC PANEL
Anion gap: 6 (ref 5–15)
BUN: 54 mg/dL — ABNORMAL HIGH (ref 8–23)
CO2: 20 mmol/L — ABNORMAL LOW (ref 22–32)
Calcium: 8.8 mg/dL — ABNORMAL LOW (ref 8.9–10.3)
Chloride: 110 mmol/L (ref 98–111)
Creatinine, Ser: 2.84 mg/dL — ABNORMAL HIGH (ref 0.44–1.00)
GFR calc Af Amer: 17 mL/min — ABNORMAL LOW (ref >60)
GFR calc non Af Amer: 15 mL/min — ABNORMAL LOW (ref >60)
Glucose, Bld: 226 mg/dL — ABNORMAL HIGH (ref 70–99)
Potassium: 4.3 mmol/L (ref 3.5–5.1)
Sodium: 136 mmol/L (ref 135–145)

## 2018-08-20 LAB — CBC
HCT: 31.9 % — ABNORMAL LOW (ref 36.0–46.0)
Hemoglobin: 10.4 g/dL — ABNORMAL LOW (ref 12.0–15.0)
MCH: 30.2 pg (ref 26.0–34.0)
MCHC: 32.6 g/dL (ref 30.0–36.0)
MCV: 92.7 fL (ref 80.0–100.0)
Platelets: 178 10*3/uL (ref 150–400)
RBC: 3.44 MIL/uL — ABNORMAL LOW (ref 3.87–5.11)
RDW: 12.9 % (ref 11.5–15.5)
WBC: 7.1 10*3/uL (ref 4.0–10.5)
nRBC: 0 % (ref 0.0–0.2)

## 2018-08-20 LAB — CBG MONITORING, ED: Glucose-Capillary: 212 mg/dL — ABNORMAL HIGH (ref 70–99)

## 2018-08-20 NOTE — ED Provider Notes (Signed)
[facility] EMERGENCY DEPARTMENT Provider Note   CSN: [id number] Arrival date & time: 08/18/2018  2037    History   Chief Complaint Chief Complaint  Patient presents with   Weakness    HPI Destiny [name] is a 85 y.o. female.     The history is provided by the patient, the EMS personnel and medical records.  Weakness  Severity:  Moderate Onset quality:  Sudden Duration:  6 hours Timing:  Constant Progression:  Unchanged Chronicity:  New Context: decreased sleep, increased activity and stress   Context: not change in medication and not dehydration   Relieved by:  Nothing Worsened by:  Standing, stress and activity Ineffective treatments:  Lying down, rest, eating and drinking fluids Associated symptoms: difficulty walking   Associated symptoms: no aphasia, no chest pain, no diarrhea, no dizziness, no dysphagia, no numbness in extremities, no falls, no fever, no headaches, no hematochezia, no loss of consciousness, no nausea, no near-syncope, no sensory-motor deficit, no shortness of breath, no stroke symptoms and no vomiting   Risk factors: coronary artery disease, diabetes and neurologic disease     Past Medical History:  Diagnosis Date   Acute renal insufficiency 12/24/2013   Amaurosis fugax    Arthritis 12/06/2012   Atherosclerosis    CAD (coronary artery disease)    a. s/p CABG x 4 (LIMA->LAD, VG->Diag, VG->OM1->OM3);  b. 2010 Cath: 3/4 patent grafts (VG->diag occluded), 3vd, sev PVD ->Med Rx;  c. 12/2011 Lexi MV: sm area of mild mid and apical ant wall ischemia ->med rx.   Carotid bruit    Chronic kidney disease (CKD) stage G4/A1, severely decreased glomerular filtration rate (GFR) between 15-29 mL/min/1.73 square meter and albuminuria creatinine ratio less than 30 mg/g (HCC)    GFR 25 on 12/25/13   CVA (cerebral infarction)    Dementia ([place])    pt denies   Depression 12/24/2013   Diabetes mellitus type II    insulin dependent.     Diabetic peripheral neuropathy ([place])    Diabetic retinopathy    Diverticulosis    Duodenal ulcer 2009 and 11/2011   caused GI bleeding   Dyspnea    Gallstones 2009   seen on CT scan   GI bleed 04/2011, 11/2011   found non-bleeding duodenal ulcers   Hyperlipidemia    Hypersomnolence 03/12/2013   Hypertension    Iron deficiency anemia secondary to blood loss (chronic)    Melena 1015/2015   PEPTIC ULCER    REQUIRING TRANSFUSION   Pedal edema 10/12/2016   PVD (peripheral vascular disease) ([place])    Rib fracture 04/21/2015   Stroke ([place])    Thiamine deficiency 08/13/2015   Weight loss, non-intentional 01/29/2014    Patient Active Problem List   Diagnosis Date Noted   Hypocalcemia 12/28/2017   Weight loss 12/28/2017   Acute encephalopathy 10/31/2017   AF (paroxysmal atrial fibrillation) ([place]) 10/31/2017   SOB (shortness of breath) 10/13/2017   Diverticulosis of colon without hemorrhage    Anticoagulated on Coumadin 06/07/2017   Lower GI bleed 06/07/2017   Encounter for therapeutic drug monitoring 05/31/2017   DVT (deep venous thrombosis) ([place]) 05/22/2017   Atrial fibrillation with RVR ([place]) 05/22/2017   Ataxia 05/21/2017   Shortness of breath 04/28/2017   Stroke (cerebrum) ([place]) 02/01/2017   Sinusitis 01/31/2017   Pedal edema 10/12/2016   Thiamine deficiency 08/13/2015   Rib fracture 04/21/2015   UTI (urinary tract infection) 04/08/2014   Duodenal ulcer with hemorrhage 12/29/2013  CKD (chronic kidney disease) stage 4, GFR 15-29 ml/min (HCC) 12/28/2013   GIB (gastrointestinal bleeding) 12/28/2013   History of CVA (cerebrovascular accident) 12/28/2013   Chronic diastolic heart failure ([place]) 12/28/2013   Overweight (BMI 25.0-29.9) 12/28/2013   Depression 12/24/2013   Hypersomnolence 03/12/2013   Arthritis 12/06/2012   Unstable angina ([place]) 06/07/2012   CAD (coronary artery disease) of artery bypass graft    Hx of CABG 06/05/2012     Debility 06/05/2012   Anemia 05/27/2011   Diabetes mellitus type 2, insulin dependent ([place]) 12/22/2010   Dementia ([place]) 12/22/2010   B12 deficiency 12/22/2010   Type 2 diabetes mellitus with diabetic neuropathy, unspecified ([place]) 08/28/2008   MURMUR 07/04/2008   AMAUROSIS FUGAX 08/12/2007   Hyperlipidemia 06/22/2007   Essential hypertension 06/22/2007   Peripheral vascular disease ([place]) 06/21/2007   CAROTID BRUIT 06/21/2007    Past Surgical History:  Procedure Laterality Date   ABDOMINAL HYSTERECTOMY     COLONOSCOPY  05/20/2011   Procedure: COLONOSCOPY;  Surgeon: Scarlette Shorts, MD;  Location: [place];  Service: Endoscopy;  Laterality: N/A;   COLONOSCOPY WITH PROPOFOL N/A 06/09/2017   Procedure: COLONOSCOPY WITH PROPOFOL;  Surgeon: Milus Banister, MD;  Location: [facility] ENDOSCOPY;  Service: Endoscopy;  Laterality: N/A;   CORONARY ARTERY BYPASS GRAFT  1999   x 4   ENTEROSCOPY N/A 12/29/2013   Procedure: ENTEROSCOPY;  Surgeon: Inda Castle, MD;  Location: [place];  Service: Endoscopy;  Laterality: N/A;   ESOPHAGOGASTRODUODENOSCOPY  05/16/2011   Procedure: ESOPHAGOGASTRODUODENOSCOPY (EGD);  Surgeon: Gatha Mayer, MD;  Location: [facility] ENDOSCOPY;  Service: Endoscopy;  Laterality: N/A;   ESOPHAGOGASTRODUODENOSCOPY  12/08/2011   Procedure: ESOPHAGOGASTRODUODENOSCOPY (EGD);  Surgeon: Ladene Artist, MD,FACG;  Location: [facility] ENDOSCOPY;  Service: Endoscopy;  Laterality: N/A;   ESOPHAGOGASTRODUODENOSCOPY (EGD) WITH PROPOFOL N/A 06/08/2017   Procedure: ESOPHAGOGASTRODUODENOSCOPY (EGD) WITH PROPOFOL;  Surgeon: Milus Banister, MD;  Location: [facility] ENDOSCOPY;  Service: Endoscopy;  Laterality: N/A;   UPPER GASTROINTESTINAL ENDOSCOPY  02/22/2008   w/biopsy, duedenal ulcer, antrum erosions     OB History   No obstetric history on file.      Home Medications    Prior to Admission medications   Medication Sig Start Date End Date Taking? Authorizing Provider  albuterol  (PROAIR HFA) 108 (90 Base) MCG/ACT inhaler Inhale 2 puffs into the lungs every 6 (six) hours as needed for wheezing or shortness of breath. 05/27/17   Saguier, Percell Miller, PA-C  amLODipine (NORVASC) 10 MG tablet Take 1 tablet (10 mg total) by mouth daily. 11/05/17   Nita Sells, MD  amLODipine (NORVASC) 5 MG tablet TAKE 1 TABLET (5 MG TOTAL) BY MOUTH DAILY. 06/02/18   Mosie Lukes, MD  atorvastatin (LIPITOR) 20 MG tablet Take 1 tablet (20 mg total) by mouth daily. 04/08/18   Mosie Lukes, MD  citalopram (CELEXA) 20 MG tablet Take 1 tablet (20 mg total) by mouth daily. 04/08/18   Mosie Lukes, MD  clopidogrel (PLAVIX) 75 MG tablet Take 1 tablet (75 mg total) by mouth daily. 03/02/18 03/02/19  Venancio Poisson, NP  famotidine (PEPCID) 40 MG tablet Take 1 tablet (40 mg total) by mouth daily. 04/08/18   Mosie Lukes, MD  glucose blood (ACCU-CHEK GUIDE) test strip Check blood sugar 2-3 times daily 04/20/18   Mosie Lukes, MD  hydrALAZINE (APRESOLINE) 25 MG tablet Take 1 tablet (25 mg total) by mouth 2 (two) times daily. 04/08/18   Mosie Lukes, MD  insulin glargine (LANTUS)  100 UNIT/ML injection Inject 0.15 mLs (15 Units total) into the skin daily. 11/04/17   Nita Sells, MD  ipratropium (ATROVENT HFA) 17 MCG/ACT inhaler Inhale 2 puffs into the lungs every 6 (six) hours as needed for wheezing. 05/21/17   Saguier, Percell Miller, PA-C  isosorbide mononitrate (IMDUR) 30 MG 24 hr tablet Take 1 tablet (30 mg total) by mouth at bedtime. 04/08/18   Mosie Lukes, MD  levETIRAcetam (KEPPRA XR) 500 MG 24 hr tablet Take 1 tablet (500 mg total) by mouth daily. 08/03/18   Venancio Poisson, NP  levETIRAcetam (KEPPRA) 500 MG tablet Take 1 tablet (500 mg total) by mouth 2 (two) times daily. Patient not taking: Reported on 08/03/2018 03/02/18   Venancio Poisson, NP  nitroGLYCERIN (NITROSTAT) 0.4 MG SL tablet Place 0.4 mg under the tongue every 5 (five) minutes as needed for chest pain.    [provider]  Omega-3 Fatty Acids (FISH OIL) 1200 MG CAPS Take 1,200 mg by mouth daily.     [provider]  pantoprazole (PROTONIX) 40 MG tablet TAKE 1 TABLET BY MOUTH ONCE DAILY 06/21/18   Mosie Lukes, MD  thiamine (VITAMIN B-1) 100 MG tablet 1 tab po M, W, F 12/09/17   Mosie Lukes, MD    Family History Family History  Problem Relation Age of Onset   Heart disease Son    Melanoma Son    Coronary artery disease Father    Hypertension Father    Stroke Father    Cancer Mother        ?   Diabetes Maternal Uncle    Stroke Sister    Epilepsy Daughter    Colon cancer Neg Hx     Social History Social History   Tobacco Use   Smoking status: Former Smoker    Types: Cigarettes    Last attempt to quit: 12/31/2000    Years since quitting: 17.6   Smokeless tobacco: Never Used  Substance Use Topics   Alcohol use: No   Drug use: No     Allergies   Patient has no known allergies.   Review of Systems Review of Systems  Constitutional: Negative for fever.  Respiratory: Negative for shortness of breath.   Cardiovascular: Negative for chest pain and near-syncope.  Gastrointestinal: Negative for diarrhea, dysphagia, hematochezia, nausea and vomiting.  Musculoskeletal: Negative for falls.  Neurological: Positive for weakness. Negative for dizziness, loss of consciousness and headaches.  All other systems reviewed and are negative.    Physical Exam Updated Vital Signs BP (!) 158/76    Pulse (!) 45    Temp (!) 97.4 F (36.3 C) (Oral)    Resp 19    Ht 5\' 6"  (1.676 m)    Wt 77.1 kg    SpO2 97%    BMI 27.44 kg/m   Physical Exam Vitals signs and nursing note reviewed.  Constitutional:      General: She is not in acute distress.    Appearance: She is well-developed.  HENT:     Head: Normocephalic and atraumatic.     Mouth/Throat:     Mouth: Mucous membranes are moist.     Comments: Uvula midline  No obvious asymmetric palatine elevation  No  obvious swellings posterior oropharynx  No obvious swellings appreciated on neck exam  Patient able to move neck in all directions without difficulty, tolerating secretions appropriately  Eyes:     Conjunctiva/sclera: Conjunctivae normal.  Neck:     Musculoskeletal: Normal range of  motion and neck supple. No neck rigidity or muscular tenderness.  Cardiovascular:     Rate and Rhythm: Normal rate.  Pulmonary:     Effort: Pulmonary effort is normal. No respiratory distress.     Breath sounds: No stridor. No wheezing or rhonchi.  Abdominal:     Palpations: Abdomen is soft.     Tenderness: There is no abdominal tenderness. There is no guarding or rebound.  Musculoskeletal: Normal range of motion.     Right lower leg: No edema.     Left lower leg: No edema.  Skin:    General: Skin is warm and dry.     Findings: No rash.  Neurological:     Mental Status: She is alert.     Comments:  5/5 motor strength bilateral upper extremities  5/5 motor strength bilateral lower extremities  No sensory deficits bilateral upper extremities, bilateral lower extremities  2+ pulses throughout  Normal finger-to-nose testing bilateral upper extremities  Patient unable to ambulate at baseline  + Romberg sign  Grossly normal visual acuity, No obvious visual field cuts bilaterally. grossly normal extraocular movements on H testing  Cranial nerves II through XII grossly intact  CN 2 (Optic): Visual fields intact to confrontation  CN 3,4,6 (EOM): Pupils equal and reactive to light. Full extraocular eye movement without nystagmus.   CN 5 (Trigeminal): Facial sensation is normal, no weakness of masticatory muscles.  CN 7 (Facial): No obvious facial droop  CN 8 (Auditory): Auditory acuity grossly normal.  CN 9,10 (Glossophar): The uvula is midline, the palate elevates symmetrically.  CN 11 (spinal access): Head midline  CN 12 (Hypoglossal): The tongue is midline.       ED Treatments /  Results  Labs (all labs ordered are listed, but only abnormal results are displayed) Labs Reviewed  BASIC METABOLIC PANEL - Abnormal; Notable for the following components:      Result Value   CO2 20 (*)    Glucose, Bld 226 (*)    BUN 54 (*)    Creatinine, Ser 2.84 (*)    Calcium 8.8 (*)    GFR calc non Af Amer 15 (*)    GFR calc Af Amer 17 (*)    All other components within normal limits  CBC - Abnormal; Notable for the following components:   RBC 3.44 (*)    Hemoglobin 10.4 (*)    HCT 31.9 (*)    All other components within normal limits  CBG MONITORING, ED - Abnormal; Notable for the following components:   Glucose-Capillary 212 (*)    All other components within normal limits  URINALYSIS, ROUTINE W REFLEX MICROSCOPIC  TROPONIN I  TSH  CBG MONITORING, ED    EKG None  Radiology Ct Head Wo Contrast  Result Date: 09/07/2018 CLINICAL DATA:  Weakness EXAM: CT HEAD WITHOUT CONTRAST TECHNIQUE: Contiguous axial images were obtained from the base of the skull through the vertex without intravenous contrast. COMPARISON:  10/31/2017 FINDINGS: Brain: No evidence of acute infarction, hemorrhage, hydrocephalus, extra-axial collection or mass lesion/mass effect. Redemonstrated extensive periventricular white matter hypodensity, including prior lacunar infarctions of the left basal ganglia and corona radiata as well as the right occipital lobe. Vascular: No hyperdense vessel or unexpected calcification. Skull: Normal. Negative for fracture or focal lesion. Sinuses/Orbits: No acute finding. Other: None. IMPRESSION: No acute intracranial pathology. Redemonstrated extensive periventricular white matter hypodensity, including prior lacunar infarctions of the left basal ganglia and corona radiata as well as the right occipital lobe. Consider  MRI to more sensitively evaluate for acutely superimposed diffusion restricting infarction if suspected. Electronically Signed   By: Eddie Candle M.D.   On:  09/02/2018 22:42   Dg Chest Portable 1 View  Result Date: 08/28/2018 CLINICAL DATA:  85 year old female with weakness. EXAM: PORTABLE CHEST 1 VIEW COMPARISON:  Chest radiograph dated 10/31/2017 FINDINGS: Minimal left lung base atelectatic changes. No focal consolidation, pleural effusion, or pneumothorax. Stable cardiomegaly. Median sternotomy wires and CABG vascular clips. No acute osseous pathology. IMPRESSION: No active disease. Electronically Signed   By: Anner Crete M.D.   On: 09/05/2018 22:15    Procedures Procedures (including critical care time)  Medications Ordered in ED Medications - No data to display   Initial Impression / Assessment and Plan / ED Course  I have reviewed the triage vital signs and the nursing notes.  Pertinent labs & imaging results that were available during my care of the patient were reviewed by me and considered in my medical decision making (see chart for details).        Medical Decision Making: Destiny [name] is a 85 y.o. female who presented to the ED today with generalized weakness, unable to ambulate at baseline.  Past medical history significant for CAD, type 2 diabetes mellitus, hypertension, history of CVA, paroxysmal atrial fibrillation no longer on anticoagulation due to recurrent falls Reviewed and confirmed nursing documentation for past medical history, family history, social history.  On my initial exam, the pt was calm, alert and interactive, not hypotensive, not tachycardic, afebrile, no increased work of breathing or respiratory distress, conversant, GCS 15.   Additional history provided by daughter at bedside, they just returned from trip to the grocery store, she was trying to get out of the car, unable to bear weight independently, symmetric weakness, did not favor either side, typically able to ambulate spontaneously, even able to ambulate up a flight of stairs independently, no other new concerning symptoms, generalized  fatigue and weakness, prior to onset of symptoms, normal course of affairs, no infectious review of systems.  Positive Romberg and unable to ambulate at baseline on exam, concern for neurologic origin, will obtain imaging, blood and urine studies for further evaluation and care.  Patient otherwise asymptomatic in ED. All radiology and laboratory studies reviewed independently and with my attending physician, agree with reading provided by radiologist unless otherwise noted.  Upon reassessing patient, patient was calm, resting comfortably, awaiting imaging.  CT head shows no emergent abnormality, awaiting MRI, worsening kidney function, creatinine 2 8 Based on the above information and believe the patient requires admission for further evaluation and care, neurology aware patient, will admit to hospitalist service. Patient admitted.  The above care was discussed with and agreed upon by my attending physician.  Emergency Department Medication Summary:  Medications - No data to display  Final Clinical Impressions(s) / ED Diagnoses   Final diagnoses:  None    ED Discharge Orders    None       Lonzo Candy, MD 08/30/2018 2318    Elnora Morrison, MD 08/21/18 [phone]

## 2018-08-20 NOTE — ED Triage Notes (Signed)
BIB [facility] from home with c/o of sudden onset of generalized weakness. Per EMS, pt ambulatory and independent at baseline, was riding with daughter in car when she became lethargic and couldn't get out of the car without assistance. Hx of UTIs and Dementia. EMS unsure of neuro baseline. Reported some RUQ tenderness to EMS.

## 2018-08-21 ENCOUNTER — Observation Stay (HOSPITAL_COMMUNITY): Payer: Medicare Other

## 2018-08-21 ENCOUNTER — Inpatient Hospital Stay (HOSPITAL_COMMUNITY): Payer: Medicare Other

## 2018-08-21 DIAGNOSIS — F329 Major depressive disorder, single episode, unspecified: Secondary | ICD-10-CM | POA: Diagnosis present

## 2018-08-21 DIAGNOSIS — I480 Paroxysmal atrial fibrillation: Secondary | ICD-10-CM | POA: Diagnosis present

## 2018-08-21 DIAGNOSIS — R29702 NIHSS score 2: Secondary | ICD-10-CM | POA: Diagnosis present

## 2018-08-21 DIAGNOSIS — E119 Type 2 diabetes mellitus without complications: Secondary | ICD-10-CM | POA: Diagnosis not present

## 2018-08-21 DIAGNOSIS — G40909 Epilepsy, unspecified, not intractable, without status epilepticus: Secondary | ICD-10-CM | POA: Diagnosis present

## 2018-08-21 DIAGNOSIS — I6329 Cerebral infarction due to unspecified occlusion or stenosis of other precerebral arteries: Secondary | ICD-10-CM | POA: Diagnosis not present

## 2018-08-21 DIAGNOSIS — G459 Transient cerebral ischemic attack, unspecified: Secondary | ICD-10-CM | POA: Diagnosis not present

## 2018-08-21 DIAGNOSIS — I358 Other nonrheumatic aortic valve disorders: Secondary | ICD-10-CM | POA: Diagnosis not present

## 2018-08-21 DIAGNOSIS — E11319 Type 2 diabetes mellitus with unspecified diabetic retinopathy without macular edema: Secondary | ICD-10-CM | POA: Diagnosis present

## 2018-08-21 DIAGNOSIS — I6389 Other cerebral infarction: Secondary | ICD-10-CM | POA: Diagnosis present

## 2018-08-21 DIAGNOSIS — R270 Ataxia, unspecified: Secondary | ICD-10-CM | POA: Diagnosis present

## 2018-08-21 DIAGNOSIS — Z87891 Personal history of nicotine dependence: Secondary | ICD-10-CM | POA: Diagnosis not present

## 2018-08-21 DIAGNOSIS — I10 Essential (primary) hypertension: Secondary | ICD-10-CM | POA: Diagnosis not present

## 2018-08-21 DIAGNOSIS — E1142 Type 2 diabetes mellitus with diabetic polyneuropathy: Secondary | ICD-10-CM | POA: Diagnosis present

## 2018-08-21 DIAGNOSIS — R531 Weakness: Secondary | ICD-10-CM | POA: Diagnosis present

## 2018-08-21 DIAGNOSIS — E860 Dehydration: Secondary | ICD-10-CM | POA: Diagnosis present

## 2018-08-21 DIAGNOSIS — N179 Acute kidney failure, unspecified: Secondary | ICD-10-CM | POA: Diagnosis present

## 2018-08-21 DIAGNOSIS — F0150 Vascular dementia without behavioral disturbance: Secondary | ICD-10-CM | POA: Diagnosis not present

## 2018-08-21 DIAGNOSIS — I348 Other nonrheumatic mitral valve disorders: Secondary | ICD-10-CM | POA: Diagnosis not present

## 2018-08-21 DIAGNOSIS — Z794 Long term (current) use of insulin: Secondary | ICD-10-CM

## 2018-08-21 DIAGNOSIS — I2510 Atherosclerotic heart disease of native coronary artery without angina pectoris: Secondary | ICD-10-CM | POA: Diagnosis present

## 2018-08-21 DIAGNOSIS — E861 Hypovolemia: Secondary | ICD-10-CM | POA: Diagnosis present

## 2018-08-21 DIAGNOSIS — N184 Chronic kidney disease, stage 4 (severe): Secondary | ICD-10-CM | POA: Diagnosis present

## 2018-08-21 DIAGNOSIS — Z66 Do not resuscitate: Secondary | ICD-10-CM | POA: Diagnosis present

## 2018-08-21 DIAGNOSIS — I130 Hypertensive heart and chronic kidney disease with heart failure and stage 1 through stage 4 chronic kidney disease, or unspecified chronic kidney disease: Secondary | ICD-10-CM | POA: Diagnosis present

## 2018-08-21 DIAGNOSIS — I5032 Chronic diastolic (congestive) heart failure: Secondary | ICD-10-CM | POA: Diagnosis present

## 2018-08-21 DIAGNOSIS — Z951 Presence of aortocoronary bypass graft: Secondary | ICD-10-CM | POA: Diagnosis not present

## 2018-08-21 DIAGNOSIS — I639 Cerebral infarction, unspecified: Secondary | ICD-10-CM | POA: Diagnosis not present

## 2018-08-21 DIAGNOSIS — Z8249 Family history of ischemic heart disease and other diseases of the circulatory system: Secondary | ICD-10-CM | POA: Diagnosis not present

## 2018-08-21 DIAGNOSIS — E785 Hyperlipidemia, unspecified: Secondary | ICD-10-CM | POA: Diagnosis present

## 2018-08-21 DIAGNOSIS — Z1159 Encounter for screening for other viral diseases: Secondary | ICD-10-CM | POA: Diagnosis not present

## 2018-08-21 DIAGNOSIS — E1122 Type 2 diabetes mellitus with diabetic chronic kidney disease: Secondary | ICD-10-CM | POA: Diagnosis present

## 2018-08-21 DIAGNOSIS — I672 Cerebral atherosclerosis: Secondary | ICD-10-CM | POA: Diagnosis not present

## 2018-08-21 DIAGNOSIS — F0390 Unspecified dementia without behavioral disturbance: Secondary | ICD-10-CM | POA: Diagnosis present

## 2018-08-21 LAB — URINALYSIS, ROUTINE W REFLEX MICROSCOPIC
Bilirubin Urine: NEGATIVE
Glucose, UA: NEGATIVE mg/dL
Hgb urine dipstick: NEGATIVE
Ketones, ur: NEGATIVE mg/dL
Nitrite: NEGATIVE
Protein, ur: >=300 mg/dL — AB
Specific Gravity, Urine: 1.014 (ref 1.005–1.030)
WBC, UA: >50 WBC/hpf — ABNORMAL HIGH (ref 0–5)
pH: 8 (ref 5.0–8.0)

## 2018-08-21 LAB — GLUCOSE, CAPILLARY
Glucose-Capillary: 163 mg/dL — ABNORMAL HIGH (ref 70–99)
Glucose-Capillary: 202 mg/dL — ABNORMAL HIGH (ref 70–99)
Glucose-Capillary: 107 mg/dL — ABNORMAL HIGH (ref 70–99)

## 2018-08-21 LAB — TROPONIN I: Troponin I: <0.03 ng/mL (ref <0.03)

## 2018-08-21 LAB — SARS CORONAVIRUS 2 BY RT PCR (HOSPITAL ORDER, PERFORMED IN ~~LOC~~ HOSPITAL LAB): SARS Coronavirus 2: NEGATIVE

## 2018-08-21 LAB — LIPID PANEL
Cholesterol: 158 mg/dL (ref 0–200)
HDL: 48 mg/dL (ref >40)
LDL Cholesterol: 92 mg/dL (ref 0–99)
Total CHOL/HDL Ratio: 3.3 RATIO
Triglycerides: 90 mg/dL (ref <150)
VLDL: 18 mg/dL (ref 0–40)

## 2018-08-21 LAB — TSH: TSH: 1.587 u[IU]/mL (ref 0.350–4.500)

## 2018-08-21 MED ORDER — SODIUM CHLORIDE 0.9 % IV SOLN: INTRAVENOUS | Status: AC

## 2018-08-21 MED ORDER — VITAMIN B-1 100 MG PO TABS
100.0000 mg | ORAL_TABLET | ORAL | Status: DC
  Administered 2018-08-22 – 2018-08-24 (×2): 100 mg via ORAL
  Filled 2018-08-21 (×2): qty 1

## 2018-08-21 MED ORDER — PANTOPRAZOLE SODIUM 40 MG PO TBEC
40.0000 mg | DELAYED_RELEASE_TABLET | Freq: Every day | ORAL | Status: DC
  Administered 2018-08-21 – 2018-08-25 (×5): 40 mg via ORAL
  Filled 2018-08-21 (×5): qty 1

## 2018-08-21 MED ORDER — ISOSORBIDE MONONITRATE ER 30 MG PO TB24
30.0000 mg | ORAL_TABLET | Freq: Every day | ORAL | Status: DC
  Administered 2018-08-21 – 2018-08-23 (×3): 30 mg via ORAL
  Filled 2018-08-21 (×3): qty 1

## 2018-08-21 MED ORDER — STROKE: EARLY STAGES OF RECOVERY BOOK
Freq: Once | Status: DC
  Filled 2018-08-21: qty 1

## 2018-08-21 MED ORDER — LEVETIRACETAM ER 500 MG PO TB24
500.0000 mg | ORAL_TABLET | Freq: Every day | ORAL | Status: DC
  Administered 2018-08-21 – 2018-08-25 (×5): 500 mg via ORAL
  Filled 2018-08-21 (×5): qty 1

## 2018-08-21 MED ORDER — AMLODIPINE BESYLATE 5 MG PO TABS
5.0000 mg | ORAL_TABLET | Freq: Every day | ORAL | Status: DC
  Administered 2018-08-21 – 2018-08-22 (×2): 5 mg via ORAL
  Filled 2018-08-21 (×2): qty 1

## 2018-08-21 MED ORDER — INSULIN GLARGINE 100 UNIT/ML ~~LOC~~ SOLN
9.0000 [IU] | Freq: Every day | SUBCUTANEOUS | Status: DC
  Administered 2018-08-21 – 2018-08-25 (×5): 9 [IU] via SUBCUTANEOUS
  Filled 2018-08-21 (×6): qty 0.09

## 2018-08-21 MED ORDER — ALBUTEROL SULFATE (2.5 MG/3ML) 0.083% IN NEBU: 2.5000 mg | INHALATION_SOLUTION | Freq: Four times a day (QID) | RESPIRATORY_TRACT | Status: DC | PRN

## 2018-08-21 MED ORDER — CLOPIDOGREL BISULFATE 75 MG PO TABS
75.0000 mg | ORAL_TABLET | Freq: Every day | ORAL | Status: DC
  Administered 2018-08-21 – 2018-08-23 (×3): 75 mg via ORAL
  Filled 2018-08-21 (×3): qty 1

## 2018-08-21 MED ORDER — FAMOTIDINE 20 MG PO TABS
20.0000 mg | ORAL_TABLET | Freq: Every day | ORAL | Status: DC
  Administered 2018-08-21 – 2018-08-25 (×5): 20 mg via ORAL
  Filled 2018-08-21: qty 2
  Filled 2018-08-21 (×4): qty 1

## 2018-08-21 MED ORDER — ACETAMINOPHEN 325 MG PO TABS
650.0000 mg | ORAL_TABLET | ORAL | Status: DC | PRN
  Administered 2018-08-21: 650 mg via ORAL
  Filled 2018-08-21: qty 2

## 2018-08-21 MED ORDER — OMEGA-3-ACID ETHYL ESTERS 1 G PO CAPS
1000.0000 mg | ORAL_CAPSULE | Freq: Every day | ORAL | Status: DC
  Administered 2018-08-21 – 2018-08-25 (×5): 1000 mg via ORAL
  Filled 2018-08-21 (×5): qty 1

## 2018-08-21 MED ORDER — ACETAMINOPHEN 650 MG RE SUPP: 650.0000 mg | RECTAL | Status: DC | PRN

## 2018-08-21 MED ORDER — ACETAMINOPHEN 160 MG/5ML PO SOLN: 650.0000 mg | ORAL | Status: DC | PRN

## 2018-08-21 MED ORDER — ATORVASTATIN CALCIUM 10 MG PO TABS
20.0000 mg | ORAL_TABLET | Freq: Every day | ORAL | Status: DC
  Administered 2018-08-21 – 2018-08-22 (×2): 20 mg via ORAL
  Filled 2018-08-21 (×2): qty 2

## 2018-08-21 MED ORDER — LEVETIRACETAM 500 MG PO TABS: 500.0000 mg | ORAL_TABLET | Freq: Two times a day (BID) | ORAL | Status: DC

## 2018-08-21 MED ORDER — ASPIRIN EC 81 MG PO TBEC
81.0000 mg | DELAYED_RELEASE_TABLET | Freq: Every day | ORAL | Status: DC
  Administered 2018-08-21 – 2018-08-23 (×3): 81 mg via ORAL
  Filled 2018-08-21 (×3): qty 1

## 2018-08-21 MED ORDER — NITROGLYCERIN 0.4 MG SL SUBL: 0.4000 mg | SUBLINGUAL_TABLET | SUBLINGUAL | Status: DC | PRN

## 2018-08-21 MED ORDER — HYDRALAZINE HCL 25 MG PO TABS
25.0000 mg | ORAL_TABLET | Freq: Two times a day (BID) | ORAL | Status: DC
  Administered 2018-08-21 – 2018-08-24 (×8): 25 mg via ORAL
  Filled 2018-08-21 (×8): qty 1

## 2018-08-21 MED ORDER — INSULIN ASPART 100 UNIT/ML ~~LOC~~ SOLN
0.0000 [IU] | Freq: Three times a day (TID) | SUBCUTANEOUS | Status: DC
  Administered 2018-08-21: 15:00:00 2 [IU] via SUBCUTANEOUS
  Administered 2018-08-21: 3 [IU] via SUBCUTANEOUS
  Administered 2018-08-22: 5 [IU] via SUBCUTANEOUS
  Administered 2018-08-23 (×2): 1 [IU] via SUBCUTANEOUS
  Administered 2018-08-24 – 2018-08-25 (×3): 2 [IU] via SUBCUTANEOUS

## 2018-08-21 MED ORDER — CITALOPRAM HYDROBROMIDE 10 MG PO TABS
20.0000 mg | ORAL_TABLET | Freq: Every day | ORAL | Status: DC
  Administered 2018-08-21 – 2018-08-25 (×5): 20 mg via ORAL
  Filled 2018-08-21 (×6): qty 2

## 2018-08-21 MED ORDER — VITAMIN D 25 MCG (1000 UNIT) PO TABS
2000.0000 [IU] | ORAL_TABLET | Freq: Every day | ORAL | Status: DC
  Administered 2018-08-21 – 2018-08-25 (×5): 2000 [IU] via ORAL
  Filled 2018-08-21 (×5): qty 2

## 2018-08-21 NOTE — Progress Notes (Signed)
PROGRESS NOTE    Destiny [name]  RCV:[id number] DOB: 10/28/1931 DOA: 08/22/2018 PCP: Mosie Lukes, MD (Confirm with patient/family/NH records and if not entered, this HAS to be entered at [facility] point of entry. "No PCP" if truly none.)   Brief Narrative:  Destiny [name] is a 85 y.o. female with medical history significant of cerebral vascular disease with a history of several CVAs and TIAs as recently as November 2018.  Noted on a regular basis by Dr. Leonie Man from neurology.  With last office visit in February 2020.  Also with history of paroxysmal atrial fibrillation, hypertension, diabetes, hyperlipidemia.  Patient has been living with her daughter-in-law and has been doing very well.  Is been pendant in her activities of daily living requiring minimal assistance.  Chronic medical problems have been well managed and she has actually reduced her use of insulin.  Followed on a regular basis by Dr. Randel Pigg.  The day on returning home from an outing sudden onset of significant difficulty with imbalance and an inability to walk without significant assistance.  Denied any other symptoms or any other areas of focal weakness because of her significant history of cerebral vascular disease and her new onset symptoms she was brought to the emergency department for evaluation. In the emergency department the patient was stable with mildly elevated blood pressure.  He demonstrated no new focal neurologic signs or symptoms.  She was pain-free.  Remained at her baseline cognitively.  Laboratory did reveal mild elevation in creatinine to 2.84 slightly above her baseline.  Serum glucose was 226.  CT brain revealed old infarcts in the basal ganglia corona radiata without acute injury.  Given her history and high risk for cerebral events and her instability in regards to gait and movement it was recommended she be admitted for observation  Assessment & Plan:   Principal Problem:   Acute arterial ischemic stroke,  vertebrobasilar, cerebellar (HCC) Active Problems:   Essential hypertension   Diabetes mellitus type 2, insulin dependent (HCC)   Dementia (HCC)   CKD (chronic kidney disease) stage 4, GFR 15-29 ml/min (HCC)   History of CVA (cerebrovascular accident)   Chronic diastolic heart failure (HCC)   TIA (transient ischemic attack)  Acute cerebellar CVA concurrent ambulatory dysfunction, right, POA, stable Per MRI; CT unremarkable at intake Echocardiogram pending Likely the cause of patient's dizziness and ambulatory dysfunction as above PT following -appreciate insight and recommendations, currently recommending inpatient rehab evaluation Pending PT, OT, speech likely disposed in the next 24 to 48 hours pending clinical improvement Neurology following, discussion today indicated likely discontinue dual antiplatelet therapy to initiate  full dose anticoagulation given current CVA with paroxysmal A. fib as below Continue core measures with statin/plavis/asa for now until DOAC initiated - may need to hold to avoid conversion to bleed - will defer to neuro Permissive hypertension for 24 hours -treat with PRN only if greater than 220/120  Paroxysmal atrial fibrillation, currently in A. fib, rate controlled not in RVR Patient currently remains in A. fib, rate controlled, ChadsVasc9 DC Asa/Plavix and start DOAC once speech clears patient  AKI on CKD 4 Creatinine baseline 1.8-1.9 - Currently 2.8 Continue to follow - likely dehydration/hypovolemic in nature Avoid IVF given hx of diastolic HF ([id number]% 2778)  Insulin dependent diabetes type 2, well controlled Continue sliding scale insulin/hypoglycemic protocol Lantus 9u HS A1C 6.3 earlier this year  ? Seizure disorder history Patient has home rx of keppra - will need to confirm this medication/indications Has keppra  500xr daily as well as 500 bid noted on med rec  Depression Resume home celexa  Diastolic HF, not in acute exacerbation, POA  EF 55-60% 09/2017 - repeat echo as above Resume core measures once speech clears  DVT prophylaxis: transition to [place] as above  Code Status: DNR status was confirmed with her daughter-in-law at admission Family Communication: Discussed evaluation and plan with patient's daughter-in-law.  All questions were answered  Disposition Plan: Change to inpt - likely quire further evaluation with PT for possible placement, inpatient rehab evaluating Consults called: Neurology was called by the emergency department staff  Admission status:  Changed to inpatient   Subjective: No acute issues or events overnight, patient still feels somewhat dizzy although this is markedly improved from previous; denies chest pain, shortness of breath, nausea, vomiting, diarrhea, constipation, headache, fever, chills.  Objective: Vitals:   08/21/18 0500 08/21/18 0726 08/21/18 0926 08/21/18 1126  BP: (!) 193/60 (!) 185/59 (!) 188/65 (!) 188/67  Pulse: (!) 44 (!) 44 (!) 44 (!) 46  Resp:  15 18 15   Temp: (!) 97.5 F (36.4 C) (!) 97.5 F (36.4 C) 97.6 F (36.4 C) (!) 97.5 F (36.4 C)  TempSrc: Oral Oral Oral Oral  SpO2: 96% 98% 97% 99%  Weight:      Height:       No intake or output data in the 24 hours ending 08/21/18 1235 Filed Weights   08/15/2018 2046  Weight: 77.1 kg    Examination:  General:  Pleasantly resting in bed, No acute distress. HEENT:  Normocephalic atraumatic.  Sclerae nonicteric, noninjected.  Extraocular movements intact bilaterally.  No nystagmus noted Neck:  Without mass or deformity.  Trachea is midline. Lungs:  Clear to auscultate bilaterally without rhonchi, wheeze, or rales. Heart:  Regular rate and rhythm.  Without murmurs, rubs, or gallops. Abdomen:  Soft, nontender, nondistended.  Without guarding or rebound. Extremities: Without cyanosis, clubbing, edema, or obvious deformity. Vascular:  Dorsalis pedis and posterior tibial pulses palpable bilaterally. Skin:  Warm and dry, no  erythema, no ulcerations.      Data Reviewed: I have personally reviewed following labs and imaging studies  CBC: Recent Labs  Lab 09/08/2018 2050  WBC 7.1  HGB 10.4*  HCT 31.9*  MCV 92.7  PLT 564   Basic Metabolic Panel: Recent Labs  Lab 08/31/2018 2050  NA 136  K 4.3  CL 110  CO2 20*  GLUCOSE 226*  BUN 54*  CREATININE 2.84*  CALCIUM 8.8*   GFR: Estimated Creatinine Clearance: 15.2 mL/min (A) (by C-G formula based on SCr of 2.84 mg/dL (H)). Liver Function Tests: No results for input(s): AST, ALT, ALKPHOS, BILITOT, PROT, ALBUMIN in the last 168 hours. No results for input(s): LIPASE, AMYLASE in the last 168 hours. No results for input(s): AMMONIA in the last 168 hours. Coagulation Profile: No results for input(s): INR, PROTIME in the last 168 hours. Cardiac Enzymes: Recent Labs  Lab 08/21/2018 2319  TROPONINI <0.03   BNP (last 3 results) No results for input(s): PROBNP in the last 8760 hours. HbA1C: No results for input(s): HGBA1C in the last 72 hours. CBG: Recent Labs  Lab 09/05/2018 2045  GLUCAP 212*   Lipid Profile: Recent Labs    08/21/18 0504  CHOL 158  HDL 48  LDLCALC 92  TRIG 90  CHOLHDL 3.3   Thyroid Function Tests: No results for input(s): TSH, T4TOTAL, FREET4, T3FREE, THYROIDAB in the last 72 hours. Anemia Panel: No results for input(s): VITAMINB12, FOLATE, FERRITIN,  TIBC, IRON, RETICCTPCT in the last 72 hours. Sepsis Labs: No results for input(s): PROCALCITON, LATICACIDVEN in the last 168 hours.  Recent Results (from the past 240 hour(s))  SARS Coronavirus 2 (CEPHEID - Performed in [facility] hospital lab), Hosp Order     Status: None   Collection Time: 08/21/18  1:52 AM  Result Value Ref Range Status   SARS Coronavirus 2 NEGATIVE NEGATIVE Final    Comment: (NOTE) If result is NEGATIVE SARS-CoV-2 target nucleic acids are NOT DETECTED. The SARS-CoV-2 RNA is generally detectable in upper and lower  respiratory specimens during the  acute phase of infection. The lowest  concentration of SARS-CoV-2 viral copies this assay can detect is 250  copies / mL. A negative result does not preclude SARS-CoV-2 infection  and should not be used as the sole basis for treatment or other  patient management decisions.  A negative result may occur with  improper specimen collection / handling, submission of specimen other  than nasopharyngeal swab, presence of viral mutation(s) within the  areas targeted by this assay, and inadequate number of viral copies  (<250 copies / mL). A negative result must be combined with clinical  observations, patient history, and epidemiological information. If result is POSITIVE SARS-CoV-2 target nucleic acids are DETECTED. The SARS-CoV-2 RNA is generally detectable in upper and lower  respiratory specimens dur ing the acute phase of infection.  Positive  results are indicative of active infection with SARS-CoV-2.  Clinical  correlation with patient history and other diagnostic information is  necessary to determine patient infection status.  Positive results do  not rule out bacterial infection or co-infection with other viruses. If result is PRESUMPTIVE POSTIVE SARS-CoV-2 nucleic acids MAY BE PRESENT.   A presumptive positive result was obtained on the submitted specimen  and confirmed on repeat testing.  While 2019 novel coronavirus  (SARS-CoV-2) nucleic acids may be present in the submitted sample  additional confirmatory testing may be necessary for epidemiological  and / or clinical management purposes  to differentiate between  SARS-CoV-2 and other Sarbecovirus currently known to infect humans.  If clinically indicated additional testing with an alternate test  methodology [id number]) is advised. The SARS-CoV-2 RNA is generally  detectable in upper and lower respiratory sp ecimens during the acute  phase of infection. The expected result is Negative. Fact Sheet for Patients:   StrictlyIdeas.no Fact Sheet for Healthcare Providers: BankingDealers.co.za This test is not yet approved or cleared by the [place] FDA and has been authorized for detection and/or diagnosis of SARS-CoV-2 by FDA under an Emergency Use Authorization (EUA).  This EUA will remain in effect (meaning this test can be used) for the duration of the COVID-19 declaration under Section 564(b)(1) of the Act, 21 U.S.C. section 360bbb-3(b)(1), unless the authorization is terminated or revoked sooner. Performed at [facility], [place] [address], [place], [place] [address]      Radiology Studies: Ct Head Wo Contrast  Result Date: 09/01/2018 CLINICAL DATA:  Weakness EXAM: CT HEAD WITHOUT CONTRAST TECHNIQUE: Contiguous axial images were obtained from the base of the skull through the vertex without intravenous contrast. COMPARISON:  10/31/2017 FINDINGS: Brain: No evidence of acute infarction, hemorrhage, hydrocephalus, extra-axial collection or mass lesion/mass effect. Redemonstrated extensive periventricular white matter hypodensity, including prior lacunar infarctions of the left basal ganglia and corona radiata as well as the right occipital lobe. Vascular: No hyperdense vessel or unexpected calcification. Skull: Normal. Negative for fracture or focal lesion. Sinuses/Orbits: No acute finding.  Other: None. IMPRESSION: No acute intracranial pathology. Redemonstrated extensive periventricular white matter hypodensity, including prior lacunar infarctions of the left basal ganglia and corona radiata as well as the right occipital lobe. Consider MRI to more sensitively evaluate for acutely superimposed diffusion restricting infarction if suspected. Electronically Signed   By: Eddie Candle M.D.   On: 08/19/2018 22:42   Mr Brain Wo Contrast  Result Date: 08/21/2018 CLINICAL DATA:  Weakness and difficulty walking EXAM: MRI HEAD WITHOUT CONTRAST TECHNIQUE:  Multiplanar, multiecho pulse sequences of the brain and surrounding structures were obtained without intravenous contrast. COMPARISON:  Head CT 08/17/2018 Brain MRI 11/03/2017 FINDINGS: BRAIN: Small area of acute ischemia within the medial right cerebellum. No other diffusion abnormality. The midline structures are normal. Multiple old infarcts, including the right occipital lobe and multiple sites of the deep gray nuclei. Early confluent hyperintense T2-weighted signal of the periventricular and deep white matter, most commonly due to chronic ischemic microangiopathy. Generalized atrophy without lobar predilection. Susceptibility-sensitive sequences show no chronic microhemorrhage or superficial siderosis. No mass lesion. VASCULAR: The major intracranial arterial and venous sinus flow voids are normal. SKULL AND UPPER CERVICAL SPINE: Calvarial bone marrow signal is normal. There is no skull base mass. Visualized upper cervical spine and soft tissues are normal. SINUSES/ORBITS: No fluid levels or advanced mucosal thickening. No mastoid or middle ear effusion. The orbits are normal. IMPRESSION: 1. Small acute ischemic infarct within the medial right cerebellum. No hemorrhage or mass effect. 2. Severe chronic ischemic microangiopathy with multiple old infarcts. Electronically Signed   By: Ulyses Jarred M.D.   On: 08/21/2018 03:54   Dg Chest Portable 1 View  Result Date: 08/17/2018 CLINICAL DATA:  85 year old female with weakness. EXAM: PORTABLE CHEST 1 VIEW COMPARISON:  Chest radiograph dated 10/31/2017 FINDINGS: Minimal left lung base atelectatic changes. No focal consolidation, pleural effusion, or pneumothorax. Stable cardiomegaly. Median sternotomy wires and CABG vascular clips. No acute osseous pathology. IMPRESSION: No active disease. Electronically Signed   By: Anner Crete M.D.   On: 09/03/2018 22:15    Scheduled Meds: .  stroke: mapping our early stages of recovery book   Does not apply Once  .  amLODipine  5 mg Oral Daily  . atorvastatin  20 mg Oral Daily  . cholecalciferol  2,000 Units Oral Daily  . citalopram  20 mg Oral Daily  . clopidogrel  75 mg Oral Daily  . famotidine  20 mg Oral Daily  . hydrALAZINE  25 mg Oral BID  . insulin aspart  0-9 Units Subcutaneous TID WC  . insulin glargine  9 Units Subcutaneous Daily  . isosorbide mononitrate  30 mg Oral Daily  . levETIRAcetam  500 mg Oral Daily  . omega-3 acid ethyl esters  1,000 mg Oral Daily  . pantoprazole  40 mg Oral Daily  . [START ON 08/22/2018] thiamine  100 mg Oral Once per day on Mon Wed Fri   Continuous Infusions: . sodium chloride       LOS: 0 days   Time spent: 89min  William C Lancaster, DO [facility] Pager [phone]  If 7PM-7AM, please contact night-coverage [email or web address] Password [email or web address] 08/21/2018, 12:35 PM

## 2018-08-21 NOTE — ED Notes (Signed)
Patient transported to MRI 

## 2018-08-21 NOTE — Progress Notes (Signed)
Rehab Admissions Coordinator Note:  Patient was screened by Michel Santee for appropriateness for an Inpatient Acute Rehab Consult.  At this time, we are recommending Inpatient Rehab consult. I will contact MD for order.   Michel Santee 08/21/2018, 5:16 PM  I can be reached at [phone].

## 2018-08-21 NOTE — Evaluation (Signed)
Occupational Therapy Evaluation Patient Details Name: Destiny [name] MRN: [id number] DOB: Aug 16, 1931 Today's Date: 08/21/2018    History of Present Illness 85 y.o. female with medical history significant of cerebral vascular disease with a history of several CVAs and TIAs as recently as November 2018. Admit with sudden onset difficulty with gait/balance.  MRI +small medial R cerebellar infarct.   Clinical Impression   PTA, pt was living with family and requiring assistance for ADL participation. She currently requires moderate assistance for UB ADL, maximum assistance for LB ADL, and total assistance for toileting hygiene and clothing management. She was able to initiate ambulation toward bathroom with moderate assistance today although limited by poor coordination, right sided drift while ambulating and dizziness. HR low in the 47-51 range with BP 181/61 and RN aware. Pt would best benefit from CIR level rehabilitation post-acute D/C to maximize functional independence with ADL and mobility. Will continue to follow while admitted.     Follow Up Recommendations  CIR;Supervision/Assistance - 24 hour    Equipment Recommendations  3 in 1 bedside commode;Tub/shower seat    Recommendations for Other Services       Precautions / Restrictions Precautions Precautions: Fall Precaution Comments: bed/chair alarm, up with assist only (very unsteady) Restrictions Weight Bearing Restrictions: No      Mobility Bed Mobility Overal bed mobility: Modified Independent             General bed mobility comments: able to transition to EOB unassisted and steady at EOB, denies increased dizziness or lightheadeness  Transfers Overall transfer level: Needs assistance Equipment used: Rolling walker (2 wheeled)(face to face guarding) Transfers: Sit to/from [facility] Sit to Stand: Mod assist Stand pivot transfers: Mod assist       General transfer comment: Assist for transition  to standing. Requiring moderate assistance for ambulation and requiring rest break and assistance to chair to ensure maintenance of balance; loss of balance quickly and will require chair follow    Balance Overall balance assessment: Needs assistance Sitting-balance support: No upper extremity supported;Feet supported Sitting balance-Leahy Scale: Fair     Standing balance support: Bilateral upper extremity supported;During functional activity Standing balance-Leahy Scale: Zero Standing balance comment: persistent instability/balance impairment consistent w/cerebellar infarct and accompanied by mild dizziness               High Level Balance Comments: did not otherwise measure balance due to dizziness and fear and elevated BP           ADL either performed or assessed with clinical judgement   ADL Overall ADL's : Needs assistance/impaired Eating/Feeding: Supervision/ safety;Set up;Sitting   Grooming: Minimal assistance;Sitting   Upper Body Bathing: Sitting;Moderate assistance   Lower Body Bathing: Maximal assistance;Sit to/from stand   Upper Body Dressing : Moderate assistance;Sitting   Lower Body Dressing: Maximal assistance;Sit to/from stand   Toilet Transfer: Moderate assistance;Ambulation;RW Toilet Transfer Details (indicate cue type and reason): becoming fatigued and with dizziness/ataxia requesting to sit halfway to bathroom; able to stand-pivot back to chair; moderate assistance required Toileting- Clothing Manipulation and Hygiene: Total assistance;Sit to/from stand Toileting - Clothing Manipulation Details (indicate cue type and reason): pt wet on my arrival; total assistance for cleaning as well as management of mesh panties (ultimately removed)     Functional mobility during ADLs: Moderate assistance;Rolling walker General ADL Comments: Pt is very unsteady, ataxic, and with weakness impacting her mobility and ADL independence.      Vision Patient Visual  Report: No change from baseline  Vision Assessment?: Yes Eye Alignment: Within Functional Limits Ocular Range of Motion: Within Functional Limits Alignment/Gaze Preference: Within Defined Limits Tracking/Visual Pursuits: Able to track stimulus in all quads without difficulty Saccades: Within functional limits Visual Fields: (appear functional) Additional Comments: Vision appearing functional at this time but will continue to monitor.      Perception     Praxis      Pertinent Vitals/Pain Pain Assessment: No/denies pain     Hand Dominance Right   Extremity/Trunk Assessment Upper Extremity Assessment Upper Extremity Assessment: RUE deficits/detail;LUE deficits/detail RUE Deficits / Details: Poor coordination as well as generalized weakness RUE Coordination: decreased gross motor;decreased fine motor LUE Deficits / Details: Poor coordination as well as decreased strength LUE Coordination: decreased gross motor;decreased fine motor   Lower Extremity Assessment Lower Extremity Assessment: Defer to PT evaluation RLE Deficits / Details: loss of coordination RLE Sensation: (denies loss of sensation) RLE Coordination: decreased gross motor LLE Deficits / Details: loss of coordination LLE Coordination: decreased gross motor       Communication Communication Communication: No difficulties   Cognition Arousal/Alertness: Awake/alert Behavior During Therapy: WFL for tasks assessed/performed Overall Cognitive Status: Impaired/Different from baseline Area of Impairment: Orientation;Memory;Awareness                 Orientation Level: Situation   Memory: (unable to recall how she ended up in hospital)     Awareness: Emergent   General Comments: pt with poor memory of events leading to hospitalization as well as current level of function at home   General Comments  Vitals: 181/61, 47-51    Exercises     Shoulder Instructions      Home Living Family/patient expects to  be discharged to:: Private residence Living Arrangements: Ashby and his wife Deanna) Available Help at Discharge: Family;Available 24 hours/day Type of Home: House Home Access: Level entry     Home Layout: One level     Bathroom Shower/Tub: Teacher, early years/pre: Standard Bathroom Accessibility: Yes   Home Equipment: Environmental consultant - 2 wheels          Prior Functioning/Environment Level of Independence: Needs assistance(Unclear, see comments)  Gait / Transfers Assistance Needed: per chart previously requiring assist for steps from home aide ADL's / Homemaking Assistance Needed: per chart, assistance with dressing and bathing   Comments: pt confused stating that she is independent; however, per chart, she requires assistance for daily tasks        OT Problem List: Decreased strength;Decreased range of motion;Decreased activity tolerance;Impaired balance (sitting and/or standing);Decreased safety awareness;Decreased knowledge of use of DME or AE;Decreased knowledge of precautions;Decreased coordination;Decreased cognition      OT Treatment/Interventions: Self-care/ADL training;Therapeutic exercise;Energy conservation;DME and/or AE instruction;Therapeutic activities;Patient/family education;Balance training    OT Goals(Current goals can be found in the care plan section) Acute Rehab OT Goals Patient Stated Goal: no dizziness, see family, go home OT Goal Formulation: With patient Time For Goal Achievement: 09/04/18 Potential to Achieve Goals: Good ADL Goals Pt Will Perform Grooming: with min assist;standing Pt Will Perform Lower Body Dressing: with min assist;sit to/from stand Pt Will Transfer to Toilet: with min assist;bedside commode;ambulating Pt Will Perform Toileting - Clothing Manipulation and hygiene: with min assist;sit to/from stand  OT Frequency: Min 3X/week   Barriers to D/C:            Co-evaluation              AM-PAC OT "6 Clicks"  Daily Activity  Outcome Measure Help from another person eating meals?: None Help from another person taking care of personal grooming?: A Little Help from another person toileting, which includes using toliet, bedpan, or urinal?: A Lot Help from another person bathing (including washing, rinsing, drying)?: A Lot Help from another person to put on and taking off regular upper body clothing?: A Lot Help from another person to put on and taking off regular lower body clothing?: A Lot 6 Click Score: 15   End of Session Equipment Utilized During Treatment: Gait belt;Rolling walker Nurse Communication: Mobility status  Activity Tolerance: Patient tolerated treatment well Patient left: in chair;with call bell/phone within reach;with chair alarm set  OT Visit Diagnosis: Other abnormalities of gait and mobility (R26.89);Other symptoms and signs involving the nervous system (R29.898);Ataxia, unspecified (R27.0)                Time: 2248-2500 OT Time Calculation (min): 24 min Charges:  OT General Charges $OT Visit: 1 Visit OT Evaluation $OT Eval Moderate Complexity: 1 Mod OT Treatments $Self Care/Home Management : 8-22 mins  [place] Office [place] A Yeslin Delio 08/21/2018, 2:44 PM

## 2018-08-21 NOTE — H&P (Signed)
History and Physical    Destiny [name] DXI:[id number] DOB: 05/08/1931 DOA: 09/13/2018  PCP: Mosie Lukes, MD  Patient coming from: home  I have personally briefly reviewed patient's old medical records in [place]  Chief Complaint: Imbalance and inability to ambulate  HPI: Destiny [name] is a 86 y.o. female with medical history significant of cerebral vascular disease with a history of several CVAs and TIAs as recently as November 2018.  Noted on a regular basis by Dr. Leonie Man from neurology.  With last office visit in February 2020.  Also with history of paroxysmal atrial fibrillation, hypertension, diabetes, hyperlipidemia.  Patient has been living with her daughter-in-law and has been doing very well.  Is been pendant in her activities of daily living requiring minimal assistance.  Chronic medical problems have been well managed and she has actually reduced her use of insulin.  Followed on a regular basis by Dr. Randel Pigg.  The day on returning home from an outing sudden onset of significant difficulty with imbalance and an inability to walk without significant assistance.  Denied any other symptoms or any other areas of focal weakness because of her significant history of cerebral vascular disease and her new onset symptoms she was brought to the emergency department for evaluation  ED Course: In the emergency department the patient was stable with mildly elevated blood pressure.  He demonstrated no new focal neurologic signs or symptoms.  She was pain-free.  Remained at her baseline cognitively.  Laboratory did reveal mild elevation in creatinine to 2.84 slightly above her baseline.  Serum glucose was 226.  CT brain revealed old infarcts in the basal ganglia corona radiata without acute injury.  Given her history and high risk for cerebral events and her instability in regards to gait and movement it was recommended she be admitted for observation  Review of Systems: As per HPI otherwise  10 point review of systems negative.  Patient specifically denies any change in vision, difficulty with swallow.  Had no headache.  Patient denies any respiratory difficulty.  She has had no chest pain or reported palpitations.  She reports she has a regular bowel habit.  She denies any paresthesias or focal weakness at this time.   Past Medical History:  Diagnosis Date   Acute renal insufficiency 12/24/2013   Amaurosis fugax    Arthritis 12/06/2012   Atherosclerosis    CAD (coronary artery disease)    a. s/p CABG x 4 (LIMA->LAD, VG->Diag, VG->OM1->OM3);  b. 2010 Cath: 3/4 patent grafts (VG->diag occluded), 3vd, sev PVD ->Med Rx;  c. 12/2011 Lexi MV: sm area of mild mid and apical ant wall ischemia ->med rx.   Carotid bruit    Chronic kidney disease (CKD) stage G4/A1, severely decreased glomerular filtration rate (GFR) between 15-29 mL/min/1.73 square meter and albuminuria creatinine ratio less than 30 mg/g (HCC)    GFR 25 on 12/25/13   CVA (cerebral infarction)    Dementia ([place])    pt denies   Depression 12/24/2013   Diabetes mellitus type II    insulin dependent.    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 (chronic)    Melena 1015/2015   PEPTIC ULCER    REQUIRING TRANSFUSION   Pedal edema 10/12/2016   PVD (peripheral vascular disease) ([place])    Rib fracture 04/21/2015   Stroke ([place])    Thiamine deficiency 08/13/2015   Weight loss, non-intentional 01/29/2014    Past Surgical History:  Procedure Laterality Date   ABDOMINAL HYSTERECTOMY     COLONOSCOPY  05/20/2011   Procedure: COLONOSCOPY;  Surgeon: Scarlette Shorts, MD;  Location: [facility];  Service: Endoscopy;  Laterality: N/A;   COLONOSCOPY WITH PROPOFOL N/A 06/09/2017   Procedure: COLONOSCOPY WITH PROPOFOL;  Surgeon: Milus Banister, MD;  Location: [facility] ENDOSCOPY;  Service: Endoscopy;  Laterality: N/A;   CORONARY ARTERY BYPASS GRAFT  1999   x 4   ENTEROSCOPY N/A 12/29/2013   Procedure: ENTEROSCOPY;  Surgeon: Inda Castle, MD;  Location: [facility];  Service: Endoscopy;  Laterality: N/A;   ESOPHAGOGASTRODUODENOSCOPY  05/16/2011   Procedure: ESOPHAGOGASTRODUODENOSCOPY (EGD);  Surgeon: Gatha Mayer, MD;  Location: [facility] ENDOSCOPY;  Service: Endoscopy;  Laterality: N/A;   ESOPHAGOGASTRODUODENOSCOPY  12/08/2011   Procedure: ESOPHAGOGASTRODUODENOSCOPY (EGD);  Surgeon: Ladene Artist, MD,FACG;  Location: [facility] ENDOSCOPY;  Service: Endoscopy;  Laterality: N/A;   ESOPHAGOGASTRODUODENOSCOPY (EGD) WITH PROPOFOL N/A 06/08/2017   Procedure: ESOPHAGOGASTRODUODENOSCOPY (EGD) WITH PROPOFOL;  Surgeon: Milus Banister, MD;  Location: [facility] ENDOSCOPY;  Service: Endoscopy;  Laterality: N/A;   UPPER GASTROINTESTINAL ENDOSCOPY  02/22/2008   w/biopsy, duedenal ulcer, antrum erosions     reports that she quit smoking about 17 years ago. Her smoking use included cigarettes. She has never used smokeless tobacco. She reports that she does not drink alcohol or use drugs.  No Known Allergies  Family History  Problem Relation Age of Onset   Heart disease Son    Melanoma Son    Coronary artery disease Father    Hypertension Father    Stroke Father    Cancer Mother        ?   Diabetes Maternal Uncle    Stroke Sister    Epilepsy Daughter    Colon cancer Neg Hx    Social history Patient is widowed.  She is currently living with her son and daughter-in-law.  She has a very supportive family.  And active and engaged in activities of daily living and interacting with her family  Prior to Admission medications   Medication Sig Start Date End Date Taking? Authorizing Provider    albuterol (PROAIR HFA) 108 (90 Base) MCG/ACT inhaler Inhale 2 puffs into the lungs every 6 (six) hours as needed for wheezing or shortness of breath. 05/27/17  Yes Saguier, Percell Miller, PA-C  amLODipine (NORVASC) 5 MG tablet TAKE 1 TABLET (5 MG TOTAL) BY MOUTH DAILY. 06/02/18  Yes Mosie Lukes, MD  atorvastatin (LIPITOR) 20 MG tablet Take 1 tablet (20 mg total) by mouth daily. 04/08/18  Yes Mosie Lukes, MD  cholecalciferol (VITAMIN D3) 25 MCG (1000 UT) tablet Take 2,000 Units by mouth daily.   Yes [provider]  citalopram (CELEXA) 20 MG tablet Take 1 tablet (20 mg total) by mouth daily. 04/08/18  Yes Mosie Lukes, MD  clopidogrel (PLAVIX) 75 MG tablet Take 1 tablet (75 mg total) by mouth daily. 03/02/18 03/02/19 Yes Venancio Poisson, NP  famotidine (PEPCID) 40 MG tablet Take 1 tablet (40 mg total) by mouth daily. Patient taking differently: Take 20 mg by mouth daily.  04/08/18  Yes Mosie Lukes, MD  hydrALAZINE (  APRESOLINE) 25 MG tablet Take 1 tablet (25 mg total) by mouth 2 (two) times daily. 04/08/18  Yes Mosie Lukes, MD  isosorbide mononitrate (IMDUR) 30 MG 24 hr tablet Take 1 tablet (30 mg total) by mouth at bedtime. Patient taking differently: Take 30 mg by mouth every morning.  04/08/18  Yes Mosie Lukes, MD  levETIRAcetam (KEPPRA XR) 500 MG 24 hr tablet Take 1 tablet (500 mg total) by mouth daily. 08/03/18  Yes Venancio Poisson, NP  nitroGLYCERIN (NITROSTAT) 0.4 MG SL tablet Place 0.4 mg under the tongue every 5 (five) minutes as needed for chest pain.   Yes [provider]  Omega-3 Fatty Acids (FISH OIL) 1200 MG CAPS Take 1,200 mg by mouth daily.    Yes [provider]  pantoprazole (PROTONIX) 40 MG tablet TAKE 1 TABLET BY MOUTH ONCE DAILY Patient taking differently: Take 40 mg by mouth 3 (three) times daily. Monday, Wednesday and friday 06/21/18  Yes Mosie Lukes, MD  thiamine (VITAMIN B-1) 100 MG tablet 1 tab po M, W, F Patient taking  differently: Take 100 mg by mouth 3 (three) times a week. Monday, Wednesday and Friday 12/09/17  Yes Mosie Lukes, MD  glucose blood (ACCU-CHEK GUIDE) test strip Check blood sugar 2-3 times daily Patient not taking: Reported on 08/21/2018 04/20/18   Mosie Lukes, MD  insulin glargine (LANTUS) 100 UNIT/ML injection Inject 0.15 mLs (15 Units total) into the skin daily. Patient not taking: Reported on 08/21/2018 11/04/17   Nita Sells, MD  ipratropium (ATROVENT HFA) 17 MCG/ACT inhaler Inhale 2 puffs into the lungs every 6 (six) hours as needed for wheezing. Patient not taking: Reported on 08/21/2018 05/21/17   Saguier, Percell Miller, PA-C  levETIRAcetam (KEPPRA) 500 MG tablet Take 1 tablet (500 mg total) by mouth 2 (two) times daily. Patient not taking: Reported on 08/03/2018 03/02/18   Venancio Poisson, NP    Physical Exam: Vitals:   08/24/2018 2100 08/24/2018 2200 08/22/2018 2300 08/21/18 0000  BP: (!) 175/60 (!) 158/76 (!) 182/71 (!) 188/70  Pulse: (!) 46 (!) 45 (!) 45 (!) 45  Resp: _0 (!) 21  Temp:      TempSrc:      SpO2: 97% 97% 97% 98%  Weight:      Height:        Constitutional: NAD, calm, comfortable Vitals:   08/16/2018 2100 08/21/2018 2200 08/17/2018 2300 08/21/18 0000  BP: (!) 175/60 (!) 158/76 (!) 182/71 (!) 188/70  Pulse: (!) 46 (!) 45 (!) 45 (!) 45  Resp: _1 (!) 21  Temp:      TempSrc:      SpO2: 97% 97% 97% 98%  Weight:      Height:       General appearance: Well-nourished well-developed woman looking younger than her stated chronologic age in no acute distress  eyes: PERRL, lids and conjunctivae normal ENMT: Mucous membranes are moist. Posterior pharynx clear of any exudate or lesions.edentulous Neck: normal, supple, no masses, no thyromegaly Respiratory: clear to auscultation bilaterally, no wheezing, no crackles. Normal respiratory effort. No accessory muscle use.  Overall decreased breath sounds Cardiovascular: Regular rate and rhythm, no murmurs / rubs /  gallops. No extremity edema. 2+ pedal pulses. No carotid bruits.  Abdomen: Mildly obese, no tenderness, no masses palpated. No hepatosplenomegaly. Bowel sounds positive.  Musculoskeletal: no clubbing / cyanosis. No joint deformity upper and lower extremities. Good ROM, no contractures. Normal muscle tone.  Skin: no rashes, lesions,  ulcers. No induration Neurologic: CN 2-12 grossly intact. Sensation intact,  Strength 5/5 in all 4.  Psychiatric: Poor insight into her condition is noted.  She has very poor short-term memory.. Alert and oriented to person and place. Normal mood.     Labs on Admission: I have personally reviewed following labs and imaging studies  CBC: Recent Labs  Lab 09/12/2018 2050  WBC 7.1  HGB 10.4*  HCT 31.9*  MCV 92.7  PLT 166   Basic Metabolic Panel: Recent Labs  Lab 08/17/2018 2050  NA 136  K 4.3  CL 110  CO2 20*  GLUCOSE 226*  BUN 54*  CREATININE 2.84*  CALCIUM 8.8*   GFR: Estimated Creatinine Clearance: 15.2 mL/min (A) (by C-G formula based on SCr of 2.84 mg/dL (H)). Liver Function Tests: No results for input(s): AST, ALT, ALKPHOS, BILITOT, PROT, ALBUMIN in the last 168 hours. No results for input(s): LIPASE, AMYLASE in the last 168 hours. No results for input(s): AMMONIA in the last 168 hours. Coagulation Profile: No results for input(s): INR, PROTIME in the last 168 hours. Cardiac Enzymes: Recent Labs  Lab 08/21/2018 2319  TROPONINI <0.03   BNP (last 3 results) No results for input(s): PROBNP in the last 8760 hours. HbA1C: No results for input(s): HGBA1C in the last 72 hours. CBG: Recent Labs  Lab 09/01/2018 2045  GLUCAP 212*   Lipid Profile: No results for input(s): CHOL, HDL, LDLCALC, TRIG, CHOLHDL, LDLDIRECT in the last 72 hours. Thyroid Function Tests: No results for input(s): TSH, T4TOTAL, FREET4, T3FREE, THYROIDAB in the last 72 hours. Anemia Panel: No results for input(s): VITAMINB12, FOLATE, FERRITIN, TIBC, IRON, RETICCTPCT in  the last 72 hours. Urine analysis:    Component Value Date/Time   COLORURINE YELLOW 10/31/2017 1955   APPEARANCEUR CLEAR 10/31/2017 1955   LABSPEC 1.011 10/31/2017 1955   PHURINE 8.0 10/31/2017 1955   GLUCOSEU NEGATIVE 10/31/2017 1955   GLUCOSEU 100 (A) 01/26/2017 1114   HGBUR NEGATIVE 10/31/2017 1955   HGBUR large 04/26/2009 [place] 10/31/2017 1955   BILIRUBINUR neg 10/28/2017 1132   [place] 10/31/2017 1955   PROTEINUR >=300 (A) 10/31/2017 1955   UROBILINOGEN 0.2 10/28/2017 1132   UROBILINOGEN 0.2 01/26/2017 1114   NITRITE NEGATIVE 10/31/2017 1955   LEUKOCYTESUR NEGATIVE 10/31/2017 1955    Radiological Exams on Admission: Ct Head Wo Contrast  Result Date: 09/07/2018 CLINICAL DATA:  Weakness EXAM: CT HEAD WITHOUT CONTRAST TECHNIQUE: Contiguous axial images were obtained from the base of the skull through the vertex without intravenous contrast. COMPARISON:  10/31/2017 FINDINGS: Brain: No evidence of acute infarction, hemorrhage, hydrocephalus, extra-axial collection or mass lesion/mass effect. Redemonstrated extensive periventricular white matter hypodensity, including prior lacunar infarctions of the left basal ganglia and corona radiata as well as the right occipital lobe. Vascular: No hyperdense vessel or unexpected calcification. Skull: Normal. Negative for fracture or focal lesion. Sinuses/Orbits: No acute finding. Other: None. IMPRESSION: No acute intracranial pathology. Redemonstrated extensive periventricular white matter hypodensity, including prior lacunar infarctions of the left basal ganglia and corona radiata as well as the right occipital lobe. Consider MRI to more sensitively evaluate for acutely superimposed diffusion restricting infarction if suspected. Electronically Signed   By: Eddie Candle M.D.   On: 09/06/2018 22:42   Dg Chest Portable 1 View  Result Date: 09/02/2018 CLINICAL DATA:  85 year old female with weakness. EXAM: PORTABLE CHEST  1 VIEW COMPARISON:  Chest radiograph dated 10/31/2017 FINDINGS: Minimal left lung base atelectatic changes. No focal consolidation, pleural effusion,  or pneumothorax. Stable cardiomegaly. Median sternotomy wires and CABG vascular clips. No acute osseous pathology. IMPRESSION: No active disease. Electronically Signed   By: Anner Crete M.D.   On: 08/27/2018 22:15    EKG: Independently reviewed.  Marked sinus bradycardia with a right bundle branch noted  Assessment/Plan Active Problems:   History of CVA (cerebrovascular accident)   Essential hypertension   Diabetes mellitus type 2, insulin dependent (HCC)   Dementia (HCC)   CKD (chronic kidney disease) stage 4, GFR 15-29 ml/min (HCC)   Chronic diastolic heart failure (HCC)  1.  Cerebrovascular disease -patient has history of cerebrovascular disease with several strokes/TIAs in the past with little residual.  Did have it time in rehab but has been able to return to live with her son and daughter-in-law and has been doing very well.  She had a sudden change in her condition this evening with loss of balance and ability to ambulate.  Examination did not reveal any focal weakness.  Suspect she may have cerebellar disease possible new CVA. Plan observation admission to monitor for progressive disease, pleat evaluation with MRI scan of the brain.  Physical therapy and Occupational Therapy evaluation to determine her ability to return home  Continue her current medications including CLO Pedro gel.  2.  CKD 4 -patient's labs reviewed and her creatinine is up from a baseline of 1.8 to 2.84.  Suspect mild dehydration in this patient. Plan gentle hydration with half-normal saline 50 cc an hour for 1 L and then reassess be met  3.  Diabetes -since moving in with her son and daughter-in-law her diet is improved and her insulin requirement has dropped to 9 units of Lantus daily.  Does occasionally require NovoLog.  Last A1c from April 08, 2018 was 6.3%  indicating good control. Plan will continue Lantus 9 units nightly.    Will follow with sliding scale while inpatient.  4.  Hypertension   patient systolic blood pressure is elevated during her emergency department evaluation.  She has not had her hydralazine today. Plan will continue her present medications.  Stock blood pressures remain elevated will make appropriate adjustments in her medications  5.  Diastolic heart failure    patient seems stable and well compensated.  Your present medications  DVT prophylaxis: We will continue Clopra gel  Code Status: DNR status was confirmed with her daughter-in-law  Family Communication: Discussed evaluation and plan with patient's daughter-in-law.  All questions were answered  Disposition Plan: Expectation is for the patient to return home within 48 hours Consults called: Neurology was called by the emergency department staff  Admission status: Observation on telemetry    Adella Hare MD [facility] Pager [phone]  If 7PM-7AM, please contact night-coverage [email or web address] Password [email or web address]  08/21/2018, 1:14 AM

## 2018-08-21 NOTE — Progress Notes (Signed)
Rehab Admissions Coordinator Note:  Patient was screened by Michel Santee for appropriateness for an Inpatient Acute Rehab Consult.  Awaiting PT/OT recommendations for patient before placing IP Rehab consult order.   Shann Medal, PT, DPT Admissions Coordinator [phone] 08/21/18  12:20 PM

## 2018-08-21 NOTE — Evaluation (Signed)
Physical Therapy Evaluation Patient Details Name: GENECIS VELEY MRN: [id number] DOB: Aug 10, 1931 Today's Date: 08/21/2018   History of Present Illness    85 y.o. female with medical history significant of cerebral vascular disease with a history of several CVAs and TIAs as recently as November 2018. Admit with sudden onset difficulty with gait/balance.  MRI +small medial R cerebellar infarct.   Clinical Impression  Pt presents with moderate to severe limitations due to loss of coordination, dizziness, and inability to walk consistent with cerebellar infarct.  Pt lives with family and has 24 hr care, would benefit from CIR consult to screen for potential admission to address deficits with goal of returning home at maximum function.  See below for details of exam findings and care plan for goals of care.     Follow Up Recommendations CIR    Equipment Recommendations  Rolling walker with 5" wheels    Recommendations for Other Services Rehab consult     Precautions / Restrictions Precautions Precautions: Fall Precaution Comments: bed/chair alarm, up with assist only (very unsteady)      Mobility  Bed Mobility Overal bed mobility: Modified Independent             General bed mobility comments: able to transition to EOB unassisted and steady at EOB, denies increased dizziness or lightheadeness  Transfers Overall transfer level: Needs assistance Equipment used: 1 person hand held assist(face to face guarding) Transfers: Sit to/from [facility] Sit to Stand: Min assist Stand pivot transfers: Mod assist       General transfer comment: pt able to move into standing but unsteady req. at least min assist to prevent fall; with support able to take pivotal steps 90 degrees to right but feels unsteady and reports incr dizziness esp after come to rest in sitting  Ambulation/Gait             General Gait Details: deferred as seated BP 204/67 and pt preoccupied  with contacting family  Stairs            Wheelchair Mobility    Modified Rankin (Stroke Patients Only) Modified Rankin (Stroke Patients Only) Pre-Morbid Rankin Score: Moderate disability Modified Rankin: Moderately severe disability     Balance Overall balance assessment: Needs assistance Sitting-balance support: No upper extremity supported;Feet supported Sitting balance-Leahy Scale: Fair     Standing balance support: Bilateral upper extremity supported;During functional activity Standing balance-Leahy Scale: Zero Standing balance comment: persistent instability/balance impairment consistent w/cerebellar infarct and accompanied by mild dizziness               High Level Balance Comments: did not otherwise measure balance due to dizziness and fear and elevated BP             Pertinent Vitals/Pain Pain Assessment: No/denies pain    Home Living Family/patient expects to be discharged to:: Private residence Living Arrangements: Children(son Greg and d-i-law Deanna) Available Help at Discharge: Family;Available 24 hours/day Type of Home: House Home Access: Level entry     Home Layout: One level Home Equipment: Walker - 2 wheels      Prior Function Level of Independence: Needs assistance(Unclear, see comments)  Per chart note, needs min assist for some ADLs       Comments: pt states she was shopping alone but children state she was with them; previous notes indicate pt needs assist for some daily tasks     Hand Dominance   Dominant Hand: Right    Extremity/Trunk Assessment  Upper Extremity Assessment Upper Extremity Assessment: Defer to OT evaluation(saw 1 instance of discoordination drinking, but fed self)    Lower Extremity Assessment Lower Extremity Assessment: Generalized weakness;RLE deficits/detail;LLE deficits/detail RLE Deficits / Details: loss of coordination RLE Sensation: (denies loss of sensation) RLE Coordination: decreased  gross motor LLE Deficits / Details: loss of coordination LLE Coordination: decreased gross motor       Communication   Communication: No difficulties  Cognition Arousal/Alertness: Awake/alert Behavior During Therapy: WFL for tasks assessed/performed Overall Cognitive Status: Impaired/Different from baseline Area of Impairment: Orientation;Memory;Awareness                 Orientation Level: Situation   Memory: (unable to recall how she ended up in hospital)     Awareness: Emergent   General Comments: pt tells me "i have a husband" but she is a widow; is able to identify son's name correctly, and while slow to process and loss of memory of admission event, pt does seem otherwise oriented      General Comments      Exercises     Assessment/Plan    PT Assessment Patient needs continued PT services  PT Problem List Decreased coordination;Decreased mobility;Decreased balance;Decreased activity tolerance;Decreased cognition;Decreased knowledge of use of DME;Cardiopulmonary status limiting activity       PT Treatment Interventions DME instruction;Patient/family education;Cognitive remediation;Neuromuscular re-education;Balance training;Therapeutic exercise;Therapeutic activities;Functional mobility training;Gait training    PT Goals (Current goals can be found in the Care Plan section)  Acute Rehab PT Goals Patient Stated Goal: no dizziness, see family, go home PT Goal Formulation: With patient/family Time For Goal Achievement: 09/04/18 Potential to Achieve Goals: Good    Frequency Min 4X/week   Barriers to discharge        Co-evaluation               AM-PAC PT "6 Clicks" Mobility  Outcome Measure Help needed turning from your back to your side while in a flat bed without using bedrails?: None Help needed moving from lying on your back to sitting on the side of a flat bed without using bedrails?: None Help needed moving to and from a bed to a chair  (including a wheelchair)?: A Lot Help needed standing up from a chair using your arms (e.g., wheelchair or bedside chair)?: A Little Help needed to walk in hospital room?: Total Help needed climbing 3-5 steps with a railing? : Total 6 Click Score: 15    End of Session   Activity Tolerance: Patient tolerated treatment well;Treatment limited secondary to medical complications (Comment)(elevated BP 204/67, down to 188/65 after 20 min) Patient left: in chair;with call bell/phone within reach;with chair alarm set;with nursing/sitter in room Nurse Communication: Mobility status PT Visit Diagnosis: Unsteadiness on feet (R26.81);Dizziness and giddiness (R42);Ataxic gait (R26.0)    Time: 8675-4492 PT Time Calculation (min) (ACUTE ONLY): 68 min   Charges:   PT Evaluation $PT Eval Moderate Complexity: 1 Mod PT Treatments $Therapeutic Activity: 38-52 mins        Kearney Hard, PT, DPT, MS Board Certified Geriatric Clinical Specialist  Herbie Drape 08/21/2018, 12:16 PM

## 2018-08-21 NOTE — Consult Note (Signed)
Neurology Consultation Reason for Consult: Stroke Referring Physician: Linda Hedges, M  CC: Unsteadiness  History is obtained from: Patient  HPI: Destiny [name] is a 85 y.o. female with a history of dementia, seizures, cerebrovascular disease who was normal on Friday night and then awoke yesterday morning with unsteadiness.  She continued to be unsteady throughout the day and therefore sought care in the emergency department yesterday evening.  In the emergency department, an MRI was obtained which demonstrates a [place] appearing cerebellar stroke.  She denies focal weakness, numbness, or other symptoms   LKW: Friday night tpa given?: no, out of window   ROS: A 14 point ROS was performed and is negative except as noted in the HPI.  Past Medical History:  Diagnosis Date  . Acute renal insufficiency 12/24/2013  . Amaurosis fugax   . Arthritis 12/06/2012  . Atherosclerosis   . CAD (coronary artery disease)    a. s/p CABG x 4 (LIMA->LAD, VG->Diag, VG->OM1->OM3);  b. 2010 Cath: 3/4 patent grafts (VG->diag occluded), 3vd, sev PVD ->Med Rx;  c. 12/2011 Lexi MV: sm area of mild mid and apical ant wall ischemia ->med rx.  . Carotid bruit   . Chronic kidney disease (CKD) stage G4/A1, severely decreased glomerular filtration rate (GFR) between 15-29 mL/min/1.73 square meter and albuminuria creatinine ratio less than 30 mg/g (HCC)    GFR 25 on 12/25/13  . CVA (cerebral infarction)   . Dementia ([place])    pt denies  . Depression 12/24/2013  . Diabetes mellitus type II    insulin dependent.   . Diabetic peripheral neuropathy ([place])   . Diabetic retinopathy   . Diverticulosis   . Duodenal ulcer 2009 and 11/2011   caused GI bleeding  . Dyspnea   . Gallstones 2009   seen on CT scan  . GI bleed 04/2011, 11/2011   found non-bleeding duodenal ulcers  . Hyperlipidemia   . Hypersomnolence 03/12/2013  . Hypertension   . Iron deficiency anemia secondary to blood loss (chronic)   . Melena 1015/2015   PEPTIC ULCER    REQUIRING TRANSFUSION  . Pedal edema 10/12/2016  . PVD (peripheral vascular disease) ([place])   . Rib fracture 04/21/2015  . Stroke ([place])   . Thiamine deficiency 08/13/2015  . Weight loss, non-intentional 01/29/2014     Family History  Problem Relation Age of Onset  . Heart disease Son   . Melanoma Son   . Coronary artery disease Father   . Hypertension Father   . Stroke Father   . Cancer Mother        ?  . Diabetes Maternal Uncle   . Stroke Sister   . Epilepsy Daughter   . Colon cancer Neg Hx      Social History:  reports that she quit smoking about 17 years ago. Her smoking use included cigarettes. She has never used smokeless tobacco. She reports that she does not drink alcohol or use drugs.   Exam: Current vital signs: BP (!) 188/67 (BP Location: Left Arm)   Pulse (!) 46   Temp (!) 97.5 F (36.4 C) (Oral)   Resp 15   Ht 5\' 6"  (1.676 m)   Wt 77.1 kg   SpO2 99%   BMI 27.44 kg/m  Vital signs in last 24 hours: Temp:  [97.4 F (36.3 C)-97.6 F (36.4 C)] 97.5 F (36.4 C) (06/07 1126) Pulse Rate:  [43-47] 46 (06/07 1126) Resp:  [13-21] 15 (06/07 1126) BP: (158-193)/(59-76) 188/67 (06/07 1126) SpO2:  [96 %-  99 %] 99 % (06/07 1126) Weight:  [77.1 kg] 77.1 kg (06/06 2046)   Physical Exam  Constitutional: Appears well-developed and well-nourished.  Psych: Affect appropriate to situation Eyes: No scleral injection HENT: No OP obstrucion Head: Normocephalic.  Cardiovascular: Normal rate and regular rhythm.  Respiratory: Effort normal, non-labored breathing GI: Soft.  No distension. There is no tenderness.  Skin: WDI  Neuro: Mental Status: Patient is awake, alert, oriented to person, place, year, and situation. She gives month as april Patient is able to give a clear and coherent history. No signs of aphasia or neglect Cranial Nerves: II: Visual Fields are full. Pupils are equal, round, and reactive to light.   III,IV, VI: EOMI without ptosis or  diploplia.  V: Facial sensation is symmetric to temperature VII: Facial movement is symmetric.  VIII: hearing is intact to voice X: Uvula elevates symmetrically XI: Shoulder shrug is symmetric. XII: tongue is midline without atrophy or fasciculations.  Motor: Tone is normal. Bulk is normal. 5/5 strength was present in all four extremities.  Sensory: Sensation is symmetric to light touch and temperature in the arms and legs. Cerebellar: She has right sided ataxia.      I have reviewed labs in epic and the results pertinent to this consultation are: LDL 117 Creatinine 2.84  I have reviewed the images obtained: MRI brain-embolic appearing cerebellar infarct  Impression: 85 year old female with recurrent embolic appearing cerebellar infarct.  Given her large degree of atherosclerotic disease in the past, I think artery to artery embolus is most likely, but she will need telemetry to look for other causes of embolic stroke.  I would restart dual antiplatelet therapy for the time being.  Recommendations: 1) ASA 81 mg plus Plavix 75 mg daily 2) MR angiogram of the head 3) increase statin therapy given that her LDL is greater than 70 4) continue Keppra 500 mg daily 5) PT, OT, ST 6) telemetry, neurochecks 7) stroke team to follow  Roland Rack, MD [facility] [phone]  If 7pm- 7am, please page neurology on call as listed in [place].

## 2018-08-21 NOTE — ED Notes (Signed)
ED TO INPATIENT HANDOFF REPORT  ED Nurse Name and Phone #: Ben [phone]  S Name/Age/Gender [name] 85 y.o. female Room/Bed: 018C/018C  Code Status   Code Status: Prior  Home/SNF/Other Home Patient oriented to: self and place Is this baseline? Yes   Triage Complete: Triage complete  Chief Complaint weakness  Triage Note BIB [facility] from home with c/o of sudden onset of generalized weakness. Per EMS, pt ambulatory and independent at baseline, was riding with daughter in car when she became lethargic and couldn't get out of the car without assistance. Hx of UTIs and Dementia. EMS unsure of neuro baseline. Reported some RUQ tenderness to EMS.    Allergies No Known Allergies  Level of Care/Admitting Diagnosis ED Disposition    ED Disposition Condition Comment   Admit  The patient appears reasonably stabilized for admission considering the current resources, flow, and capabilities available in the ED at this time, and I doubt any other [facility] requiring further screening and/or treatment in the ED prior to admission is  present.       B Medical/Surgery History Past Medical History:  Diagnosis Date  . Acute renal insufficiency 12/24/2013  . Amaurosis fugax   . Arthritis 12/06/2012  . Atherosclerosis   . CAD (coronary artery disease)    a. s/p CABG x 4 (LIMA->LAD, VG->Diag, VG->OM1->OM3);  b. 2010 Cath: 3/4 patent grafts (VG->diag occluded), 3vd, sev PVD ->Med Rx;  c. 12/2011 Lexi MV: sm area of mild mid and apical ant wall ischemia ->med rx.  . Carotid bruit   . Chronic kidney disease (CKD) stage G4/A1, severely decreased glomerular filtration rate (GFR) between 15-29 mL/min/1.73 square meter and albuminuria creatinine ratio less than 30 mg/g (HCC)    GFR 25 on 12/25/13  . CVA (cerebral infarction)   . Dementia ([place])    pt denies  . Depression 12/24/2013  . Diabetes mellitus type II    insulin dependent.   . Diabetic peripheral neuropathy ([place])   . Diabetic retinopathy    . Diverticulosis   . Duodenal ulcer 2009 and 11/2011   caused GI bleeding  . Dyspnea   . Gallstones 2009   seen on CT scan  . GI bleed 04/2011, 11/2011   found non-bleeding duodenal ulcers  . Hyperlipidemia   . Hypersomnolence 03/12/2013  . Hypertension   . Iron deficiency anemia secondary to blood loss (chronic)   . Melena 1015/2015   PEPTIC ULCER    REQUIRING TRANSFUSION  . Pedal edema 10/12/2016  . PVD (peripheral vascular disease) ([place])   . Rib fracture 04/21/2015  . Stroke ([name])   . Thiamine deficiency 08/13/2015  . Weight loss, non-intentional 01/29/2014   Past Surgical History:  Procedure Laterality Date  . ABDOMINAL HYSTERECTOMY    . COLONOSCOPY  05/20/2011   Procedure: COLONOSCOPY;  Surgeon: Scarlette Shorts, MD;  Location: [facility];  Service: Endoscopy;  Laterality: N/A;  . COLONOSCOPY WITH PROPOFOL N/A 06/09/2017   Procedure: COLONOSCOPY WITH PROPOFOL;  Surgeon: Milus Banister, MD;  Location: [facility] ENDOSCOPY;  Service: Endoscopy;  Laterality: N/A;  . CORONARY ARTERY BYPASS GRAFT  1999   x 4  . ENTEROSCOPY N/A 12/29/2013   Procedure: ENTEROSCOPY;  Surgeon: Inda Castle, MD;  Location: [facility];  Service: Endoscopy;  Laterality: N/A;  . ESOPHAGOGASTRODUODENOSCOPY  05/16/2011   Procedure: ESOPHAGOGASTRODUODENOSCOPY (EGD);  Surgeon: Gatha Mayer, MD;  Location[facility] ENDOSCOPY;  Service: Endoscopy;  Laterality: N/A;  . ESOPHAGOGASTRODUODENOSCOPY  12/08/2011   Procedure: ESOPHAGOGASTRODUODENOSCOPY (EGD);  Surgeon: Ladene Artist, MD,FACG;  Location[facility] END[facility]COPY;  Service: Endoscopy;  Laterality: N/A;  . ESOPHAGOGASTRODUODENOSCOPY (EGD) WITH PROPOFOL N/A 06/08/2017   Procedure: ESOPHAGOGASTRODUODENOSCOPY (EGD) WITH PROPOFOL;  Surgeon: Milus Banister, MD;  Location: [facility] ENDOSCOPY;  Service: Endoscopy;  Laterality: N/A;  . UPPER GASTROINTESTINAL ENDOSCOPY  02/22/2008   w/biopsy, duedenal ulcer, antrum erosions     A IV Location/Drains/Wounds Patient Lines/Drains/Airways  Status   Active Line/Drains/Airways    Name:   Placement date:   Placement time:   Site:   Days:   Peripheral IV 08/24/2018 Left Hand   08/27/2018    2049    Hand   1   Peripheral IV 08/17/2018 Right Forearm   08/29/2018    2049    Forearm   1   AIRWAYS   06/09/17    1000     438   AIRWAYS   06/09/17    1000     438          Intake/Output Last 24 hours No intake or output data in the 24 hours ending 08/21/18 0040  Labs/Imaging Results for orders placed or performed during the hospital encounter of 09/12/2018 (from the past 48 hour(s))  CBG monitoring, ED     Status: Abnormal   Collection Time: 09/09/2018  8:45 PM  Result Value Ref Range   Glucose-Capillary 212 (H) 70 - 99 mg/dL  Basic metabolic panel     Status: Abnormal   Collection Time: 08/19/2018  8:50 PM  Result Value Ref Range   Sodium 136 135 - 145 mmol/L   Potassium 4.3 3.5 - 5.1 mmol/L   Chloride 110 98 - 111 mmol/L   CO2 20 (L) 22 - 32 mmol/L   Glucose, Bld 226 (H) 70 - 99 mg/dL   BUN 54 (H) 8 - 23 mg/dL   Creatinine, Ser 2.84 (H) 0.44 - 1.00 mg/dL   Calcium 8.8 (L) 8.9 - 10.3 mg/dL   GFR calc non Af Amer 15 (L) >60 mL/min   GFR calc Af Amer 17 (L) >60 mL/min   Anion gap 6 5 - 15    Comment: Performed at [facility], [address] [place], [place] [address]  CBC     Status: Abnormal   Collection Time: 08/18/2018  8:50 PM  Result Value Ref Range   WBC 7.1 4.0 - 10.5 K/uL   RBC 3.44 (L) 3.87 - 5.11 MIL/uL   Hemoglobin 10.4 (L) 12.0 - 15.0 g/dL   HCT 31.9 (L) 36.0 - 46.0 %   MCV 92.7 80.0 - 100.0 fL   MCH 30.2 26.0 - 34.0 pg   MCHC 32.6 30.0 - 36.0 g/dL   RDW 12.9 11.5 - 15.5 %   Platelets 178 150 - 400 K/uL   nRBC 0.0 0.0 - 0.2 %    Comment: Performed at [facility], [place] [address], [place], [place] [address]  Troponin I - ONCE - STAT     Status: None   Collection Time: 08/19/2018 11:19 PM  Result Value Ref Range   Troponin I <0.03 <0.03 ng/mL    Comment: Performed at [facility] [address] [place], [place] [address]   Ct Head Wo Contrast  Result Date: 09/08/2018 CLINICAL DATA:  Weakness EXAM: CT HEAD WITHOUT CONTRAST TECHNIQUE: Contiguous axial images were obtained from the base of the skull through the vertex without intravenous contrast. COMPARISON:  10/31/2017 FINDINGS: Brain: No evidence of acute infarction, hemorrhage,  hydrocephalus, extra-axial collection or mass lesion/mass effect. Redemonstrated extensive periventricular white matter hypodensity, including prior lacunar infarctions of the left basal ganglia and corona radiata as well as the right occipital lobe. Vascular: No hyperdense vessel or unexpected calcification. Skull: Normal. Negative for fracture or focal lesion. Sinuses/Orbits: No acute finding. Other: None. IMPRESSION: No acute intracranial pathology. Redemonstrated extensive periventricular white matter hypodensity, including prior lacunar infarctions of the left basal ganglia and corona radiata as well as the right occipital lobe. Consider MRI to more sensitively evaluate for acutely superimposed diffusion restricting infarction if suspected. Electronically Signed   By: Eddie Candle M.D.   On: 08/30/2018 22:42   Dg Chest Portable 1 View  Result Date: 08/27/2018 CLINICAL DATA:  85 year old female with weakness. EXAM: PORTABLE CHEST 1 VIEW COMPARISON:  Chest radiograph dated 10/31/2017 FINDINGS: Minimal left lung base atelectatic changes. No focal consolidation, pleural effusion, or pneumothorax. Stable cardiomegaly. Median sternotomy wires and CABG vascular clips. No acute osseous pathology. IMPRESSION: No active disease. Electronically Signed   By: Anner Crete M.D.   On: 09/07/2018 22:15    Pending Labs Unresulted Labs (From admission, onward)    Start     Ordered   09/09/2018 2156  TSH  ONCE - STAT,   STAT     09/04/2018 2201   08/30/2018 2048  Urinalysis, Routine w reflex microscopic  Once,   STAT     09/09/2018 2047          Vitals/Pain Today's Vitals    09/06/2018 2200 09/03/2018 2300 08/21/18 0000 08/21/18 0028  BP: (!) 158/76 (!) 182/71 (!) 188/70   Pulse: (!) 45 (!) 45 (!) 45   Resp: 19 16 (!) 21   Temp:      TempSrc:      SpO2: 97% 97% 98%   Weight:      Height:      PainSc:    0-No pain    Isolation Precautions No active isolations  Medications Medications - No data to display  Mobility non-ambulatory High fall risk   Focused Assessments Neuro Assessment Handoff:  Swallow screen pass? Yes    NIH Stroke Scale ( + Modified Stroke Scale Criteria)  Interval: Initial Level of Consciousness (1a.)   : Alert, keenly responsive LOC Questions (1b. )   +: Answers neither question correctly LOC Commands (1c. )   + : Performs both tasks correctly Best Gaze (2. )  +: Normal Visual (3. )  +: No visual loss Facial Palsy (4. )    : Normal symmetrical movements Motor Arm, Left (5a. )   +: No drift Motor Arm, Right (5b. )   +: No drift Motor Leg, Left (6a. )   +: No drift Motor Leg, Right (6b. )   +: No drift Limb Ataxia (7. ): Absent Sensory (8. )   +: Normal, no sensory loss Best Language (9. )   +: No aphasia Dysarthria (10. ): Normal Extinction/Inattention (11.)   +: No Abnormality Modified SS Total  +: 2 Complete NIHSS TOTAL: 2     Neuro Assessment: Exceptions to WDL Neuro Checks:   Initial (08/21/18 0026)  Last Documented NIHSS Modified Score: 2 (08/21/18 0026) Has TPA been given? No If patient is a Neuro Trauma and patient is going to OR before floor call report to [place] nurse: [phone] or [phone]     R Recommendations: See Admitting Provider Note  Report given to:   Additional Notes: N/A

## 2018-08-22 ENCOUNTER — Inpatient Hospital Stay (HOSPITAL_COMMUNITY): Payer: Medicare Other

## 2018-08-22 DIAGNOSIS — I348 Other nonrheumatic mitral valve disorders: Secondary | ICD-10-CM

## 2018-08-22 DIAGNOSIS — I6329 Cerebral infarction due to unspecified occlusion or stenosis of other precerebral arteries: Secondary | ICD-10-CM

## 2018-08-22 DIAGNOSIS — I358 Other nonrheumatic aortic valve disorders: Secondary | ICD-10-CM

## 2018-08-22 LAB — BASIC METABOLIC PANEL
Anion gap: 7 (ref 5–15)
BUN: 52 mg/dL — ABNORMAL HIGH (ref 8–23)
CO2: 19 mmol/L — ABNORMAL LOW (ref 22–32)
Calcium: 8.9 mg/dL (ref 8.9–10.3)
Chloride: 114 mmol/L — ABNORMAL HIGH (ref 98–111)
Creatinine, Ser: 2.9 mg/dL — ABNORMAL HIGH (ref 0.44–1.00)
GFR calc Af Amer: 16 mL/min — ABNORMAL LOW (ref >60)
GFR calc non Af Amer: 14 mL/min — ABNORMAL LOW (ref >60)
Glucose, Bld: 113 mg/dL — ABNORMAL HIGH (ref 70–99)
Potassium: 4.3 mmol/L (ref 3.5–5.1)
Sodium: 140 mmol/L (ref 135–145)

## 2018-08-22 LAB — CBC
HCT: 27.4 % — ABNORMAL LOW (ref 36.0–46.0)
Hemoglobin: 9.4 g/dL — ABNORMAL LOW (ref 12.0–15.0)
MCH: 30.9 pg (ref 26.0–34.0)
MCHC: 34.3 g/dL (ref 30.0–36.0)
MCV: 90.1 fL (ref 80.0–100.0)
Platelets: 158 10*3/uL (ref 150–400)
RBC: 3.04 MIL/uL — ABNORMAL LOW (ref 3.87–5.11)
RDW: 12.8 % (ref 11.5–15.5)
WBC: 7.5 10*3/uL (ref 4.0–10.5)
nRBC: 0 % (ref 0.0–0.2)

## 2018-08-22 LAB — HEMOGLOBIN A1C
Hgb A1c MFr Bld: 5.7 % — ABNORMAL HIGH (ref 4.8–5.6)
Mean Plasma Glucose: 116.89 mg/dL

## 2018-08-22 LAB — GLUCOSE, CAPILLARY
Glucose-Capillary: 99 mg/dL (ref 70–99)
Glucose-Capillary: 258 mg/dL — ABNORMAL HIGH (ref 70–99)
Glucose-Capillary: 98 mg/dL (ref 70–99)
Glucose-Capillary: 149 mg/dL — ABNORMAL HIGH (ref 70–99)

## 2018-08-22 LAB — ECHOCARDIOGRAM COMPLETE
Height: 66 in
Weight: 2720 oz

## 2018-08-22 MED ORDER — ATORVASTATIN CALCIUM 40 MG PO TABS
40.0000 mg | ORAL_TABLET | Freq: Every day | ORAL | Status: DC
  Administered 2018-08-23 – 2018-08-25 (×3): 40 mg via ORAL
  Filled 2018-08-22 (×3): qty 1

## 2018-08-22 MED ORDER — AMLODIPINE BESYLATE 10 MG PO TABS
10.0000 mg | ORAL_TABLET | Freq: Every day | ORAL | Status: DC
  Administered 2018-08-23 – 2018-08-25 (×3): 10 mg via ORAL
  Filled 2018-08-22 (×3): qty 1

## 2018-08-22 NOTE — Progress Notes (Signed)
  Echocardiogram 2D Echocardiogram has been performed.  Destiny [name] 08/22/2018, 2:42 PM

## 2018-08-22 NOTE — Progress Notes (Addendum)
PROGRESS NOTE    Destiny [name]  HDQ:[id number] DOB: 1931-12-29 DOA: 08/24/2018 PCP: Mosie Lukes, MD  Brief Narrative:  Destiny [name] is a 85 y.o. female with medical history significant of cerebral vascular disease with a history of several CVAs and TIAs as recently as November 2018.  Noted on a regular basis by Dr. Leonie Man from neurology.  With last office visit in February 2020.  Also with history of paroxysmal atrial fibrillation, hypertension, diabetes, hyperlipidemia.  Patient has been living with her daughter-in-law and has been doing very well.  Is been pendant in her activities of daily living requiring minimal assistance.  Chronic medical problems have been well managed and she has actually reduced her use of insulin.  Followed on a regular basis by Dr. Randel Pigg.  The day on returning home from an outing sudden onset of significant difficulty with imbalance and an inability to walk without significant assistance.  Denied any other symptoms or any other areas of focal weakness because of her significant history of cerebral vascular disease and her new onset symptoms she was brought to the emergency department for evaluation. In the emergency department the patient was stable with mildly elevated blood pressure.  He demonstrated no new focal neurologic signs or symptoms.  She was pain-free.  Remained at her baseline cognitively.  Laboratory did reveal mild elevation in creatinine to 2.84 slightly above her baseline.  Serum glucose was 226.  CT brain revealed old infarcts in the basal ganglia corona radiata without acute injury.  Given her history and high risk for cerebral events and her instability in regards to gait and movement it was recommended she be admitted for observation  Assessment & Plan:   Principal Problem:   Acute arterial ischemic stroke, vertebrobasilar, cerebellar (HCC) Active Problems:   Essential hypertension   Diabetes mellitus type 2, insulin dependent (HCC)   Dementia  (HCC)   CKD (chronic kidney disease) stage 4, GFR 15-29 ml/min (HCC)   History of CVA (cerebrovascular accident)   Chronic diastolic heart failure (HCC)   TIA (transient ischemic attack)  Acute cerebellar CVA concurrent ambulatory dysfunction, right, POA, stable Per MRI; CT unremarkable at intake Echocardiogram pending Likely the cause of patient's dizziness and ambulatory dysfunction as above PT following -appreciate insight and recommendations, currently recommending inpatient rehab evaluation Neurology following, discussion today indicated likely discontinue dual antiplatelet therapy to initiate  full dose anticoagulation given current CVA with paroxysmal A. fib as below Continue core measures with statin/plavis/asa for now until DOAC initiated - may need to hold to avoid conversion to bleed - will defer to neuro Resume HTN medications now out of permissive HTN window  Paroxysmal atrial fibrillation, currently in A. fib, rate controlled not in RVR Patient currently remains in A. fib, rate controlled, ChadsVasc9 DC Asa/Plavix and start DOAC once speech clears patient and approved by neuro  AKI on CKD 4 Creatinine baseline 1.8-1.9 - Currently 2.9 Continue to follow - likely dehydration/hypovolemic in nature Encourage PO intake Avoid IVF given hx of diastolic HF ([id number]% 4081)  Insulin dependent diabetes type 2, well controlled Continue sliding scale insulin/hypoglycemic protocol Lantus 9u HS A1C 6.3 earlier this year  ? Seizure disorder history Patient has home rx of keppra - will need to confirm this medication/indications Has keppra 500xr daily as well as 500 bid noted on med rec  Depression Resume home celexa  Diastolic HF, not in acute exacerbation, POA EF 55-60% 09/2017 - repeat echo as above Resume core measures once speech  clears  DVT prophylaxis: transition to [place] as above  Code Status: DNR status was confirmed with her daughter-in-law at admission (POA)  Disposition Plan:  Continue as inpatient- likely quire further evaluation with PT for possible placement, inpatient rehab evaluating Consults called: Neurology was called by the emergency department staff  Admission status:  Inpatient   Subjective: No acute issues or events overnight, patient still feels somewhat dizzy although this is markedly improved from previous; denies chest pain, shortness of breath, nausea, vomiting, diarrhea, constipation, headache, fever, chills.  Objective: Vitals:   08/22/18 0022 08/22/18 0421 08/22/18 0811 08/22/18 1227  BP: (!) 156/57 (!) 177/59 (!) 189/59 (!) 188/54  Pulse: (!) 44 (!) 44 (!) 45 (!) 47  Resp: 16 16 20 20   Temp: 97.7 F (36.5 C) 98.1 F (36.7 C) 97.9 F (36.6 C) 97.9 F (36.6 C)  TempSrc: Oral Oral Oral Oral  SpO2: 98% 98% 98% 98%  Weight:      Height:        Intake/Output Summary (Last 24 hours) at 08/22/2018 1604 Last data filed at 08/22/2018 1232 Gross per 24 hour  Intake 840 ml  Output 250 ml  Net 590 ml   Filed Weights   08/31/2018 2046  Weight: 77.1 kg    Examination:  General:  Pleasantly resting in bed, No acute distress. HEENT:  Normocephalic atraumatic.  Sclerae nonicteric, noninjected.  Extraocular movements intact bilaterally.  No nystagmus noted Neck:  Without mass or deformity.  Trachea is midline. Lungs:  Clear to auscultate bilaterally without rhonchi, wheeze, or rales. Heart:  Regular rate and rhythm.  Without murmurs, rubs, or gallops. Abdomen:  Soft, nontender, nondistended.  Without guarding or rebound. Extremities: Without cyanosis, clubbing, edema, or obvious deformity. Vascular:  Dorsalis pedis and posterior tibial pulses palpable bilaterally. Skin:  Warm and dry, no erythema, no ulcerations.      Data Reviewed: I have personally reviewed following labs and imaging studies  CBC: Recent Labs  Lab 09/12/2018 2050 08/22/18 0702  WBC 7.1 7.5  HGB 10.4* 9.4*  HCT 31.9* 27.4*  MCV 92.7 90.1  PLT 178  008   Basic Metabolic Panel: Recent Labs  Lab 09/09/2018 2050 08/22/18 0702  NA 136 140  K 4.3 4.3  CL 110 114*  CO2 20* 19*  GLUCOSE 226* 113*  BUN 54* 52*  CREATININE 2.84* 2.90*  CALCIUM 8.8* 8.9   GFR: Estimated Creatinine Clearance: 14.9 mL/min (A) (by C-G formula based on SCr of 2.9 mg/dL (H)). Liver Function Tests: No results for input(s): AST, ALT, ALKPHOS, BILITOT, PROT, ALBUMIN in the last 168 hours. No results for input(s): LIPASE, AMYLASE in the last 168 hours. No results for input(s): AMMONIA in the last 168 hours. Coagulation Profile: No results for input(s): INR, PROTIME in the last 168 hours. Cardiac Enzymes: Recent Labs  Lab 09/06/2018 2319  TROPONINI <0.03   BNP (last 3 results) No results for input(s): PROBNP in the last 8760 hours. HbA1C: Recent Labs    08/22/18 0702  HGBA1C 5.7*   CBG: Recent Labs  Lab 08/21/18 1441 08/21/18 1653 08/21/18 2122 08/22/18 0751 08/22/18 1230  GLUCAP 163* 202* 107* 99 258*   Lipid Profile: Recent Labs    08/21/18 0504  CHOL 158  HDL 48  LDLCALC 92  TRIG 90  CHOLHDL 3.3   Thyroid Function Tests: Recent Labs    08/28/2018 2319  TSH 1.587   Anemia Panel: No results for input(s): VITAMINB12, FOLATE, FERRITIN, TIBC, IRON, RETICCTPCT in the last  72 hours. Sepsis Labs: No results for input(s): PROCALCITON, LATICACIDVEN in the last 168 hours.  Recent Results (from the past 240 hour(s))  SARS Coronavirus 2 (CEPHEID - Performed in [place] hospital lab), Hosp Order     Status: None   Collection Time: 08/21/18  1:52 AM  Result Value Ref Range Status   SARS Coronavirus 2 NEGATIVE NEGATIVE Final    Comment: (NOTE) If result is NEGATIVE SARS-CoV-2 target nucleic acids are NOT DETECTED. The SARS-CoV-2 RNA is generally detectable in upper and lower  respiratory specimens during the acute phase of infection. The lowest  concentration of SARS-CoV-2 viral copies this assay can detect is 250  copies / mL. A  negative result does not preclude SARS-CoV-2 infection  and should not be used as the sole basis for treatment or other  patient management decisions.  A negative result may occur with  improper specimen collection / handling, submission of specimen other  than nasopharyngeal swab, presence of viral mutation(s) within the  areas targeted by this assay, and inadequate number of viral copies  (<250 copies / mL). A negative result must be combined with clinical  observations, patient history, and epidemiological information. If result is POSITIVE SARS-CoV-2 target nucleic acids are DETECTED. The SARS-CoV-2 RNA is generally detectable in upper and lower  respiratory specimens dur ing the acute phase of infection.  Positive  results are indicative of active infection with SARS-CoV-2.  Clinical  correlation with patient history and other diagnostic information is  necessary to determine patient infection status.  Positive results do  not rule out bacterial infection or co-infection with other viruses. If result is PRESUMPTIVE POSTIVE SARS-CoV-2 nucleic acids MAY BE PRESENT.   A presumptive positive result was obtained on the submitted specimen  and confirmed on repeat testing.  While 2019 novel coronavirus  (SARS-CoV-2) nucleic acids may be present in the submitted sample  additional confirmatory testing may be necessary for epidemiological  and / or clinical management purposes  to differentiate between  SARS-CoV-2 and other Sarbecovirus currently known to infect humans.  If clinically indicated additional testing with an alternate test  methodology [phone]) is advised. The SARS-CoV-2 RNA is generally  detectable in upper and lower respiratory sp ecimens during the acute  phase of infection. The expected result is Negative. Fact Sheet for Patients:  StrictlyIdeas.no Fact Sheet for Healthcare Providers: BankingDealers.co.za This test is not  yet approved or cleared by the [place] FDA and has been authorized for detection and/or diagnosis of SARS-CoV-2 by FDA under an Emergency Use Authorization (EUA).  This EUA will remain in effect (meaning this test can be used) for the duration of the COVID-19 declaration under Section 564(b)(1) of the Act, 21 U.S.C. section 360bbb-3(b)(1), unless the authorization is terminated or revoked sooner. Performed at [facility], [place] [address], [place],  [address]      Radiology Studies: Ct Head Wo Contrast  Result Date: 09/02/2018 CLINICAL DATA:  Weakness EXAM: CT HEAD WITHOUT CONTRAST TECHNIQUE: Contiguous axial images were obtained from the base of the skull through the vertex without intravenous contrast. COMPARISON:  10/31/2017 FINDINGS: Brain: No evidence of acute infarction, hemorrhage, hydrocephalus, extra-axial collection or mass lesion/mass effect. Redemonstrated extensive periventricular white matter hypodensity, including prior lacunar infarctions of the left basal ganglia and corona radiata as well as the right occipital lobe. Vascular: No hyperdense vessel or unexpected calcification. Skull: Normal. Negative for fracture or focal lesion. Sinuses/Orbits: No acute finding. Other: None. IMPRESSION: No acute intracranial  pathology. Redemonstrated extensive periventricular white matter hypodensity, including prior lacunar infarctions of the left basal ganglia and corona radiata as well as the right occipital lobe. Consider MRI to more sensitively evaluate for acutely superimposed diffusion restricting infarction if suspected. Electronically Signed   By: Eddie Candle M.D.   On: 09/13/2018 22:42   Mr Jodene Nam Head Wo Contrast  Result Date: 08/21/2018 CLINICAL DATA:  Stroke follow-up EXAM: MRA HEAD WITHOUT CONTRAST TECHNIQUE: Angiographic images of the Circle of Willis were obtained using MRA technique without intravenous contrast. COMPARISON:  11/03/2017 FINDINGS: The right vertebral  artery is likely non dominant. Compared to prior there is now total absence of visible right vertebral and PICA flow, which correlates with the acute infarcts. A small stump is seen at the vertebrobasilar junction. There is atherosclerotic irregularity of the left V4 segment without flow limiting stenosis. High-grade proximal basilar stenosis. High-grade narrowing at the right PCA origin with downstream underfilling when compared to the left. Diffuse atherosclerotic irregularity and narrowing of the cavernous carotids. There is advanced narrowing at the distal left cavernous ICA flow reducing. Extensive atherosclerotic irregularity at bilateral MCA branches and at the bilateral distal M1 segment. No branch occlusion or aneurysm. IMPRESSION: 1. Severe intracranial atherosclerosis. 2. Faint flow in the right vertebral artery and PICA on 2019 comparison is no longer seen and correlates with the acute infarct. 3. High-grade proximal basilar stenosis. 4. High-grade right PCA origin stenosis with downstream underfilling. 5. High-grade left cavernous ICA narrowing. 6. Moderate to advanced bilateral M1 stenoses. Electronically Signed   By: Monte Fantasia M.D.   On: 08/21/2018 16:21   Mr Brain Wo Contrast  Result Date: 08/21/2018 CLINICAL DATA:  Weakness and difficulty walking EXAM: MRI HEAD WITHOUT CONTRAST TECHNIQUE: Multiplanar, multiecho pulse sequences of the brain and surrounding structures were obtained without intravenous contrast. COMPARISON:  Head CT 08/17/2018 Brain MRI 11/03/2017 FINDINGS: BRAIN: Small area of acute ischemia within the medial right cerebellum. No other diffusion abnormality. The midline structures are normal. Multiple old infarcts, including the right occipital lobe and multiple sites of the deep gray nuclei. Early confluent hyperintense T2-weighted signal of the periventricular and deep white matter, most commonly due to chronic ischemic microangiopathy. Generalized atrophy without lobar  predilection. Susceptibility-sensitive sequences show no chronic microhemorrhage or superficial siderosis. No mass lesion. VASCULAR: The major intracranial arterial and venous sinus flow voids are normal. SKULL AND UPPER CERVICAL SPINE: Calvarial bone marrow signal is normal. There is no skull base mass. Visualized upper cervical spine and soft tissues are normal. SINUSES/ORBITS: No fluid levels or advanced mucosal thickening. No mastoid or middle ear effusion. The orbits are normal. IMPRESSION: 1. Small acute ischemic infarct within the medial right cerebellum. No hemorrhage or mass effect. 2. Severe chronic ischemic microangiopathy with multiple old infarcts. Electronically Signed   By: Ulyses Jarred M.D.   On: 08/21/2018 03:54   Dg Chest Portable 1 View  Result Date: 08/27/2018 CLINICAL DATA:  85 year old female with weakness. EXAM: PORTABLE CHEST 1 VIEW COMPARISON:  Chest radiograph dated 10/31/2017 FINDINGS: Minimal left lung base atelectatic changes. No focal consolidation, pleural effusion, or pneumothorax. Stable cardiomegaly. Median sternotomy wires and CABG vascular clips. No acute osseous pathology. IMPRESSION: No active disease. Electronically Signed   By: Anner Crete M.D.   On: 08/16/2018 22:15   Vas US Carotid  Result Date: 08/22/2018 Carotid Arterial Duplex Study Indications:  CVA. Risk Factors: Hypertension, hyperlipidemia, Diabetes, coronary artery disease,  PAD. Performing Technologist: Maudry Mayhew RDMS, RVT, RDCS  Examination Guidelines: A complete evaluation includes B-mode imaging, spectral Doppler, color Doppler, and power Doppler as needed of all accessible portions of each vessel. Bilateral testing is considered an integral part of a complete examination. Limited examinations for reoccurring indications may be performed as noted.  Right Carotid Findings: +----------+--------+-------+--------+--------------------------------+--------+           PSV cm/sEDV     StenosisDescribe                        Comments                   cm/s                                                    +----------+--------+-------+--------+--------------------------------+--------+ CCA Prox  71      11                                                      +----------+--------+-------+--------+--------------------------------+--------+ CCA Distal71      12             smooth, heterogenous and                                                  calcific                                 +----------+--------+-------+--------+--------------------------------+--------+ ICA Prox  79      19             heterogenous and calcific                +----------+--------+-------+--------+--------------------------------+--------+ ICA Distal107     22                                                      +----------+--------+-------+--------+--------------------------------+--------+ ECA       269     16                                                      +----------+--------+-------+--------+--------------------------------+--------+ +----------+--------+-------+----------------+-------------------+           PSV cm/sEDV cmsDescribe        Arm Pressure (mmHG) +----------+--------+-------+----------------+-------------------+ [id number]            Multiphasic, WNL                    +----------+--------+-------+----------------+-------------------+ +---------+--------+--+--------+-+---------+ VertebralPSV cm/s32EDV cm/s8Antegrade +---------+--------+--+--------+-+---------+  Left Carotid Findings: +----------+-------+-------+--------+---------------------------------+--------+           PSV    EDV    StenosisDescribe  Comments           cm/s   cm/s                                                     +----------+-------+-------+--------+---------------------------------+--------+ CCA Prox  50     9               smooth, heterogenous and calcific         +----------+-------+-------+--------+---------------------------------+--------+ CCA Distal74     15             smooth, heterogenous and calcific         +----------+-------+-------+--------+---------------------------------+--------+ ICA Prox  68     12             heterogenous and calcific                 +----------+-------+-------+--------+---------------------------------+--------+ ICA Distal57     9                                                        +----------+-------+-------+--------+---------------------------------+--------+ ECA       49     14             heterogenous, irregular and                                               calcific                                  +----------+-------+-------+--------+---------------------------------+--------+ +----------+--------+--------+----------------+-------------------+ SubclavianPSV cm/sEDV cm/sDescribe        Arm Pressure (mmHG) +----------+--------+--------+----------------+-------------------+           243             Multiphasic, WNL                    +----------+--------+--------+----------------+-------------------+ +---------+--------+--+--------+--+---------+ VertebralPSV cm/s81EDV cm/s19Antegrade +---------+--------+--+--------+--+---------+  Summary: Right Carotid: Velocities in the right ICA are consistent with a 1-39% stenosis. Left Carotid: Velocities in the left ICA are consistent with a 1-39% stenosis. Vertebrals:  Bilateral vertebral arteries demonstrate antegrade flow. Subclavians: Normal flow hemodynamics were seen in bilateral subclavian              arteries. *See table(s) above for measurements and observations.  Electronically signed by Antony Contras MD on 08/22/2018 at 1:35:24 PM.    Final     Scheduled Meds: .  stroke: mapping our early stages of recovery book   Does not apply Once  . [START ON 08/23/2018] amLODipine  10  mg Oral Daily  . aspirin EC  81 mg Oral Daily  . [START ON 08/23/2018] atorvastatin  40 mg Oral Daily  . cholecalciferol  2,000 Units Oral Daily  . citalopram  20 mg Oral Daily  . clopidogrel  75 mg Oral Daily  . famotidine  20 mg Oral Daily  . hydrALAZINE  25 mg Oral BID  . insulin aspart  0-9 Units Subcutaneous TID WC  .  insulin glargine  9 Units Subcutaneous Daily  . isosorbide mononitrate  30 mg Oral Daily  . levETIRAcetam  500 mg Oral Daily  . omega-3 acid ethyl esters  1,000 mg Oral Daily  . pantoprazole  40 mg Oral Daily  . thiamine  100 mg Oral Once per day on Mon Wed Fri   Continuous Infusions:    LOS: 1 day   Time spent: 65min  Frederick Klinger C Gillermo Poch, DO [facility] Pager [phone]  If 7PM-7AM, please contact night-coverage [email or web address] Password [facility] 08/22/2018, 4:04 PM

## 2018-08-22 NOTE — Progress Notes (Signed)
Per CCMD, pt had a 4 beat run of V-Tach.  Notified her RN.

## 2018-08-22 NOTE — Progress Notes (Signed)
Carotid artery duplex completed. Refer to "CV Proc" under chart review to view preliminary results.  08/22/2018 11:27 AM Maudry Mayhew, MHA, RVT, RDCS, RDMS

## 2018-08-22 NOTE — Progress Notes (Signed)
Physical Therapy Treatment Patient Details Name: Destiny [name] MRN: [id number] DOB: 1931/10/14 Today's Date: 08/22/2018    History of Present Illness 85 y.o. female with medical history significant of cerebral vascular disease with a history of several CVAs and TIAs as recently as November 2018. Admit with sudden onset difficulty with gait/balance.  MRI +small medial R cerebellar infarct.    PT Comments    Upon PT arrival, pt states she is fatigued but is willing to participate in PT with mod verbal encouragement. Pt required min assist for bed mobility, and exhibited R lateral leaning and posterior leaning with dynamic sitting balance activities, pt requiring min assist from PT to correct balance. Pt unable to come to standing this session, stating she felt weak and dizzy. Pt's BP WNL for pt, see general comments below. Pt required mod assist to transfer via lateral scoot transfer to drop arm recliner. Pt to continue to follow acutely, will progress mobility as able.   BP: 175/65 HR: 43-57 bpm (RN notified)    Follow Up Recommendations  CIR     Equipment Recommendations  Rolling walker with 5" wheels    Recommendations for Other Services Rehab consult     Precautions / Restrictions Precautions Precautions: Fall Precaution Comments: bed/chair alarm, up with assist only (very unsteady) Restrictions Weight Bearing Restrictions: No    Mobility  Bed Mobility Overal bed mobility: Needs Assistance Bed Mobility: Supine to Sit     Supine to sit: Min assist;HOB elevated     General bed mobility comments: Min assist for scooting to EOB, pt able to elevate trunk with increased time with use of bed rails. Pt with heavy R lateral leaning upon sitting EOB, requires min assist from PT to correct.   Transfers Overall transfer level: Needs assistance Equipment used: Rolling walker (2 wheeled)(face to face guarding) Transfers: Sit to/from Stand;Lateral/Scoot Transfers Sit to Stand: Mod  assist         General transfer comment: Sit to stand attempted with RW, PT providing max assist +1 and unable to get pt to perform hip extension to come to standing. Pt states "I am going to fall" and sat back down quickly. PT and pt opted for scoot transfer to drop arm recliner, mod assist for scooting into recliner with bed pad. VC for hand placement when transferring, using UEs to power up.   Ambulation/Gait Ambulation/Gait assistance: (NT - pt states she feels dizzy and weak)           General Gait Details: deferred, pt reporting increased dizziness   Stairs             Wheelchair Mobility    Modified Rankin (Stroke Patients Only)       Balance Overall balance assessment: Needs assistance Sitting-balance support: No upper extremity supported;Feet supported Sitting balance-Leahy Scale: Poor Sitting balance - Comments: heavy R lateral lean, requires PT assist to correct. Pt sat EOB 10 minutes with UE reaching tasks bilaterally, LE kicking.  Postural control: Right lateral lean;Posterior lean Standing balance support: Bilateral upper extremity supported;During functional activity Standing balance-Leahy Scale: Zero Standing balance comment: unable to come to standing this session                            Cognition Arousal/Alertness: Awake/alert Behavior During Therapy: WFL for tasks assessed/performed Overall Cognitive Status: Impaired/Different from baseline Area of Impairment: Orientation;Memory;Awareness;Problem solving  Orientation Level: Situation   Memory: (unable to recall how she ended up in hospital)     Awareness: Emergent Problem Solving: Requires tactile cues;Requires verbal cues;Slow processing;Decreased initiation General Comments: pt with poor memory of events leading to hospitalization as well as current level of function at home (on eval); pt with increased processing time, periods of eye closing during PT  session      Exercises General Exercises - Lower Extremity Ankle Circles/Pumps: AROM;Both;20 reps;Supine Long Arc Quad: AROM;Both;5 reps;Seated Heel Slides: AROM;Both;5 reps;Supine Straight Leg Raises: AROM;Both;5 reps;Supine    General Comments General comments (skin integrity, edema, etc.): BP 178/65 sitting EOB, HR 43-57 during session. Monitor registered pair PVCs when pt sitting EOB, pt asymptomatic.       Pertinent Vitals/Pain Pain Assessment: No/denies pain    Home Living                      Prior Function            PT Goals (current goals can now be found in the care plan section) Acute Rehab PT Goals Patient Stated Goal: no dizziness, see family, go home PT Goal Formulation: With patient/family Time For Goal Achievement: 09/04/18 Potential to Achieve Goals: Good Progress towards PT goals: Progressing toward goals    Frequency    Min 4X/week      PT Plan Current plan remains appropriate    Co-evaluation              AM-PAC PT "6 Clicks" Mobility   Outcome Measure  Help needed turning from your back to your side while in a flat bed without using bedrails?: A Little Help needed moving from lying on your back to sitting on the side of a flat bed without using bedrails?: A Little Help needed moving to and from a bed to a chair (including a wheelchair)?: A Lot Help needed standing up from a chair using your arms (e.g., wheelchair or bedside chair)?: A Little Help needed to walk in hospital room?: Total Help needed climbing 3-5 steps with a railing? : Total 6 Click Score: 13    End of Session Equipment Utilized During Treatment: Gait belt Activity Tolerance: Patient tolerated treatment well Patient left: in chair;with call bell/phone within reach;with chair alarm set;with nursing/sitter in room Nurse Communication: Mobility status PT Visit Diagnosis: Unsteadiness on feet (R26.81);Dizziness and giddiness (R42);Ataxic gait (R26.0)      Time: 7829-5621 PT Time Calculation (min) (ACUTE ONLY): 24 min  Charges:  $Therapeutic Exercise: 8-22 mins $Therapeutic Activity: 8-22 mins                     Jahmire Ruffins Conception Chancy, PT Acute Rehabilitation Services Pager [phone]  Office [phone]  Rayner Erman D Shalissa Easterwood 08/22/2018, 3:07 PM

## 2018-08-22 NOTE — Progress Notes (Signed)
Inpatient Rehab Admissions:  Inpatient Rehab Consult received.  I met with patient at the bedside for rehabilitation assessment and to discuss goals and expectations of an inpatient rehab admission.  She reports that she was living alone, completely independent, driving to the grocery store daily prior to admission.  This is conflicting with documentation from therapy notes, which state she lived with her son and was requiring assistance for all mobility/ADLs.  Will need to contact son and confirm PLOF and expectations for a possible CIR stay.  Will also need insurance authorization before I can admit patient.   Signed:  , PT, DPT Admissions Coordinator [phone] 08/22/18  4:53 PM   

## 2018-08-22 NOTE — Progress Notes (Signed)
STROKE TEAM PROGRESS NOTE   INTERVAL HISTORY I have personally reviewed history of presenting illness with the patient.  She denies any prior history of strokes or TIAs.  She feels her gait is still unsteady though improving.  MRI scan of the brain shows 2 tiny embolic appearing medial cerebellar infarcts on the right.  MRA shows absent terminal right vertebral artery and PICA with advanced changes of intracranial atherosclerosis  Vitals:   08/21/18 2035 08/22/18 0022 08/22/18 0421 08/22/18 0811  BP: (!) 178/58 (!) 156/57 (!) 177/59   Pulse: (!) 46 (!) 44 (!) 44   Resp: 16 16 16    Temp: 97.7 F (36.5 C) 97.7 F (36.5 C) 98.1 F (36.7 C) 97.9 F (36.6 C)  TempSrc: Oral Oral Oral Oral  SpO2: 99% 98% 98%   Weight:      Height:        CBC:  Recent Labs  Lab 09/10/2018 2050 08/22/18 0702  WBC 7.1 7.5  HGB 10.4* 9.4*  HCT 31.9* 27.4*  MCV 92.7 90.1  PLT 178 308    Basic Metabolic Panel:  Recent Labs  Lab 09/12/2018 2050  NA 136  K 4.3  CL 110  CO2 20*  GLUCOSE 226*  BUN 54*  CREATININE 2.84*  CALCIUM 8.8*   Lipid Panel:     Component Value Date/Time   CHOL 158 08/21/2018 0504   TRIG 90 08/21/2018 0504   HDL 48 08/21/2018 0504   CHOLHDL 3.3 08/21/2018 0504   VLDL 18 08/21/2018 0504   LDLCALC 92 08/21/2018 0504   HgbA1c:  Lab Results  Component Value Date   HGBA1C 5.7 (H) 08/22/2018   Urine Drug Screen:     Component Value Date/Time   LABOPIA NONE DETECTED 10/31/2017 1955   COCAINSCRNUR NONE DETECTED 10/31/2017 1955   LABBENZ NONE DETECTED 10/31/2017 1955   AMPHETMU NONE DETECTED 10/31/2017 1955   THCU NONE DETECTED 10/31/2017 1955   LABBARB NONE DETECTED 10/31/2017 1955    Alcohol Level     Component Value Date/Time   ETH <10 10/31/2017 1806    IMAGING Ct Head Wo Contrast  Result Date: 08/24/2018 CLINICAL DATA:  Weakness EXAM: CT HEAD WITHOUT CONTRAST TECHNIQUE: Contiguous axial images were obtained from the base of the skull through the vertex  without intravenous contrast. COMPARISON:  10/31/2017 FINDINGS: Brain: No evidence of acute infarction, hemorrhage, hydrocephalus, extra-axial collection or mass lesion/mass effect. Redemonstrated extensive periventricular white matter hypodensity, including prior lacunar infarctions of the left basal ganglia and corona radiata as well as the right occipital lobe. Vascular: No hyperdense vessel or unexpected calcification. Skull: Normal. Negative for fracture or focal lesion. Sinuses/Orbits: No acute finding. Other: None. IMPRESSION: No acute intracranial pathology. Redemonstrated extensive periventricular white matter hypodensity, including prior lacunar infarctions of the left basal ganglia and corona radiata as well as the right occipital lobe. Consider MRI to more sensitively evaluate for acutely superimposed diffusion restricting infarction if suspected. Electronically Signed   By: Eddie Candle M.D.   On: 08/23/2018 22:42   Mr Jodene Nam Head Wo Contrast  Result Date: 08/21/2018 CLINICAL DATA:  Stroke follow-up EXAM: MRA HEAD WITHOUT CONTRAST TECHNIQUE: Angiographic images of the Circle of Willis were obtained using MRA technique without intravenous contrast. COMPARISON:  11/03/2017 FINDINGS: The right vertebral artery is likely non dominant. Compared to prior there is now total absence of visible right vertebral and PICA flow, which correlates with the acute infarcts. A small stump is seen at the vertebrobasilar junction. There is atherosclerotic irregularity  of the left V4 segment without flow limiting stenosis. High-grade proximal basilar stenosis. High-grade narrowing at the right PCA origin with downstream underfilling when compared to the left. Diffuse atherosclerotic irregularity and narrowing of the cavernous carotids. There is advanced narrowing at the distal left cavernous ICA flow reducing. Extensive atherosclerotic irregularity at bilateral MCA branches and at the bilateral distal M1 segment. No branch  occlusion or aneurysm. IMPRESSION: 1. Severe intracranial atherosclerosis. 2. Faint flow in the right vertebral artery and PICA on 2019 comparison is no longer seen and correlates with the acute infarct. 3. High-grade proximal basilar stenosis. 4. High-grade right PCA origin stenosis with downstream underfilling. 5. High-grade left cavernous ICA narrowing. 6. Moderate to advanced bilateral M1 stenoses. Electronically Signed   By: Monte Fantasia M.D.   On: 08/21/2018 16:21   Mr Brain Wo Contrast  Result Date: 08/21/2018 CLINICAL DATA:  Weakness and difficulty walking EXAM: MRI HEAD WITHOUT CONTRAST TECHNIQUE: Multiplanar, multiecho pulse sequences of the brain and surrounding structures were obtained without intravenous contrast. COMPARISON:  Head CT 09/02/2018 Brain MRI 11/03/2017 FINDINGS: BRAIN: Small area of acute ischemia within the medial right cerebellum. No other diffusion abnormality. The midline structures are normal. Multiple old infarcts, including the right occipital lobe and multiple sites of the deep gray nuclei. Early confluent hyperintense T2-weighted signal of the periventricular and deep white matter, most commonly due to chronic ischemic microangiopathy. Generalized atrophy without lobar predilection. Susceptibility-sensitive sequences show no chronic microhemorrhage or superficial siderosis. No mass lesion. VASCULAR: The major intracranial arterial and venous sinus flow voids are normal. SKULL AND UPPER CERVICAL SPINE: Calvarial bone marrow signal is normal. There is no skull base mass. Visualized upper cervical spine and soft tissues are normal. SINUSES/ORBITS: No fluid levels or advanced mucosal thickening. No mastoid or middle ear effusion. The orbits are normal. IMPRESSION: 1. Small acute ischemic infarct within the medial right cerebellum. No hemorrhage or mass effect. 2. Severe chronic ischemic microangiopathy with multiple old infarcts. Electronically Signed   By: Ulyses Jarred M.D.    On: 08/21/2018 03:54   Dg Chest Portable 1 View  Result Date: 08/29/2018 CLINICAL DATA:  85 year old female with weakness. EXAM: PORTABLE CHEST 1 VIEW COMPARISON:  Chest radiograph dated 10/31/2017 FINDINGS: Minimal left lung base atelectatic changes. No focal consolidation, pleural effusion, or pneumothorax. Stable cardiomegaly. Median sternotomy wires and CABG vascular clips. No acute osseous pathology. IMPRESSION: No active disease. Electronically Signed   By: Anner Crete M.D.   On: 09/13/2018 22:15    PHYSICAL EXAM Pleasant elderly Caucasian lady currently not in distress. . Afebrile. Head is nontraumatic. Neck is supple without bruit.    Cardiac exam no murmur or gallop. Lungs are clear to auscultation. Distal pulses are well felt. Neurological Exam ;  Awake  Alert oriented x 3. Normal speech and language.eye movements full without nystagmus but saccadic dysmetria on right lateral gaze..fundi were not visualized. Vision acuity and fields appear normal. Hearing is normal. Palatal movements are normal. Face symmetric. Tongue midline. Normal strength, tone, reflexes and coordination. Normal sensation. Gait deferred.  ASSESSMENT/PLAN Destiny [name] is a 85 y.o. female with history of dementia, seizures, cerebrovascular disease presenting with unsteadiness.   Stroke:   Small R medial cerebellar infarct in setting of R PICA occlusion, secondary to large vessel disease source vs PAF  CT head No acute stroke. Old L BG, CR, occipital lacunes.   MRI  Small R medial cerebellar infarct. Severe Small vessel disease with mult old infarcts.  MRA  Severe Small vessel disease. R PICA occluded (was open in 2019). High-grade proximal basilar, R PCA origin stenoses. High grade L ICA narrowing. Mod to advanced B M1 stenoses.  Carotid Doppler  B ICA 1-39% stenosis, VAs antegrade   2D Echo pending   LDL 92  HgbA1c 5.7  SCDs for VTE prophylaxis  clopidogrel 75 mg daily prior to admission,  now on Eliquis for new diagnosis of paroxysmal A. fib    Therapy recommendations:  CIR.   Disposition:  pending   Possible Paroxysmal atrial Fibrillation Hx LLE DVT  Listed on HPI from DR Norins  No mention of AF hx in any neuro notes prior to admission   AF not seen on most recent EKGs  Pt bas been on AC in the past with coumadin for LLE DVT 05/2017  Current Home anticoagulation:  none , was on aspirin and plavix  If PAF dx confirmed, agree with plans for [facility] at d/c  Given renal disease, will likely need warfarin   Hypertension  Stable . Permissive hypertension (OK if < 220/120) but gradually normalize in 5-7 days . Long-term BP goal normotensive  Hyperlipidemia  Home meds:  lipitor 20 and fish oil, resumed in hospital  LDL 92, goal < 70  Increased lipitor to 40 to meet goal  Continue statin at discharge  Diabetes type II Controlled  HgbA1c 5.7, at goal < 7.0  Other Stroke Risk Factors  Advanced age  Former Cigarette smoker, quit 17 yrs ago  Overweight, Body mass index is 27.44 kg/m., recommend weight loss, diet and exercise as appropriate   Hx stroke/TIA  01/2017 -TIA w/ transient speech disturbance and expressive aphasia most likely given stroke risk factors and possible leftICA stenosis.  Cannot rule out complex partial seizure.  No recurrences.  Family hx stroke (father, sister)  Coronary artery disease s/p CABG  PVD  History diastolic heart failure, not in exacerbation  Hx LLE DVT treated with coumadin 05/2017   Other Active Problems  Longstanding mild Alzheimer's dementia   Seizure disorder stable on Keppra   CKD stage  Cr 2.84  Depression on home Celexa  New diagnosis of paroxysmal A. fib during this admission  Hospital day # 1 I have personally obtained history,examined this patient, reviewed notes, independently viewed imaging studies, participated in medical decision making and plan of care.ROS completed by me personally and  pertinent positives fully documented  I have made any additions or clarifications directly to the above note.  She presented with gait ataxia secondary to medial right cerebellar infarcts likely from right PICA occlusion due to intracranial atherosclerosis.  But she was found to have paroxysmal A. fib on monitor on admission.  Recommend Eliquis for anticoagulation for paroxysmal A. fib.  Aggressive risk factor modification.  Physical occupational therapy consults.  Discussed with Dr. Avon Gully.  Greater than 50% time during this 35-minute visit was spent on counseling and coordination of care about her stroke and discussion about stroke or stratification, prevention and treatment  Antony Contras, MD Medical Director [facility] Pager: [phone] 08/22/2018 3:58 PM   To contact Stroke Continuity provider, please refer to [email or web address] After hours, contact General Neurology

## 2018-08-23 LAB — GLUCOSE, CAPILLARY
Glucose-Capillary: 102 mg/dL — ABNORMAL HIGH (ref 70–99)
Glucose-Capillary: 143 mg/dL — ABNORMAL HIGH (ref 70–99)
Glucose-Capillary: 127 mg/dL — ABNORMAL HIGH (ref 70–99)
Glucose-Capillary: 145 mg/dL — ABNORMAL HIGH (ref 70–99)

## 2018-08-23 LAB — CBC
HCT: 26.9 % — ABNORMAL LOW (ref 36.0–46.0)
Hemoglobin: 9.1 g/dL — ABNORMAL LOW (ref 12.0–15.0)
MCH: 30.3 pg (ref 26.0–34.0)
MCHC: 33.8 g/dL (ref 30.0–36.0)
MCV: 89.7 fL (ref 80.0–100.0)
Platelets: 167 10*3/uL (ref 150–400)
RBC: 3 MIL/uL — ABNORMAL LOW (ref 3.87–5.11)
RDW: 12.7 % (ref 11.5–15.5)
WBC: 8.2 10*3/uL (ref 4.0–10.5)
nRBC: 0 % (ref 0.0–0.2)

## 2018-08-23 LAB — BASIC METABOLIC PANEL
Anion gap: 6 (ref 5–15)
BUN: 51 mg/dL — ABNORMAL HIGH (ref 8–23)
CO2: 20 mmol/L — ABNORMAL LOW (ref 22–32)
Calcium: 8.6 mg/dL — ABNORMAL LOW (ref 8.9–10.3)
Chloride: 113 mmol/L — ABNORMAL HIGH (ref 98–111)
Creatinine, Ser: 2.82 mg/dL — ABNORMAL HIGH (ref 0.44–1.00)
GFR calc Af Amer: 17 mL/min — ABNORMAL LOW (ref >60)
GFR calc non Af Amer: 15 mL/min — ABNORMAL LOW (ref >60)
Glucose, Bld: 93 mg/dL (ref 70–99)
Potassium: 4 mmol/L (ref 3.5–5.1)
Sodium: 139 mmol/L (ref 135–145)

## 2018-08-23 MED ORDER — APIXABAN 2.5 MG PO TABS
2.5000 mg | ORAL_TABLET | Freq: Two times a day (BID) | ORAL | Status: DC
  Administered 2018-08-23 – 2018-08-25 (×5): 2.5 mg via ORAL
  Filled 2018-08-23 (×5): qty 1

## 2018-08-23 MED ORDER — ISOSORBIDE MONONITRATE ER 30 MG PO TB24
30.0000 mg | ORAL_TABLET | Freq: Once | ORAL | Status: AC
  Administered 2018-08-23: 30 mg via ORAL
  Filled 2018-08-23: qty 1

## 2018-08-23 MED ORDER — ISOSORBIDE MONONITRATE ER 60 MG PO TB24
60.0000 mg | ORAL_TABLET | Freq: Every day | ORAL | Status: DC
  Administered 2018-08-24 – 2018-08-25 (×2): 60 mg via ORAL
  Filled 2018-08-23 (×2): qty 1

## 2018-08-23 NOTE — Discharge Instructions (Signed)

## 2018-08-23 NOTE — Consult Note (Signed)
   [facility] Inpatient Consult   08/23/2018  Destiny [name] 08-18-1931 [id number]    Patient'schart has been reviewed as benefitfrom her [employer] and has 21% medium risk scorefor unplanned readmission and hospitalization. Patient was previously engaged by [facility] care Nurse, children's but is no longer active.    Chart review and history & physical dated 08/21/18, show asfollows: Destiny [name] is a 85 y.o. female with medical history significant of cerebral vascular disease with a history of several CVAs and TIAs as recently as November 2018.  Noted on a regular basis by Dr. Leonie Man from neurology. With last office visit in February 2020.  Also with history of paroxysmal atrial fibrillation, hypertension, diabetes, hyperlipidemia.  Patient has been living with her daughter-in-law and has been doing very well.  Is been pendant in her activities of daily living requiring minimal assistance.  Chronic medical problems have been well managed and she has actually reduced her use of insulin.  Followed on a regular basis by Dr. Randel Pigg.  The day on returning home from an outing sudden onset of significant difficulty with imbalance and an inability to walk without significant assistance.  Denied any other symptoms or any other areas of focal weakness because of her significant history of cerebral vascular disease and her new onset symptoms she was brought to the emergency department for evaluation. (Acute arterial ischemic stroke, vertebrobasilar, cerebellar)   Per PT and OT notes reviewed, currently recommends inpatientlevel rehab therapies (CIR- [facility]).  Will continue to followfordisposition, in casethere are any changesin dispositionand needs for appropriate community follow-up,please referto [facility] care management.  Of note, [facility] Care Management services does not replace or interfere with any services arranged by transition of care team- CM or  social work.  Primary care provider is Dr.Stacy [name] with [employer] at [facility], listed as providing transition of care follow-up.   For questions and additional information, please call:  Amadu Schlageter A. Rollande Thursby, BSN, RN-BC [facility] Liaison Cell: [phone]

## 2018-08-23 NOTE — Progress Notes (Signed)
PROGRESS NOTE    Destiny [name]  ZOX:[id number] DOB: 03/28/1931 DOA: 08/26/2018 PCP: Mosie Lukes, MD  Brief Narrative:  Destiny [name] is a 85 y.o. female with medical history significant of cerebral vascular disease with a history of several CVAs and TIAs as recently as November 2018.  Noted on a regular basis by Dr. Leonie Man from neurology.  With last office visit in February 2020.  Also with history of paroxysmal atrial fibrillation, hypertension, diabetes, hyperlipidemia.  Patient has been living with her daughter-in-law and has been doing very well.  Is been pendant in her activities of daily living requiring minimal assistance.  Chronic medical problems have been well managed and she has actually reduced her use of insulin.  Followed on a regular basis by Dr. Randel Pigg.  The day on returning home from an outing sudden onset of significant difficulty with imbalance and an inability to walk without significant assistance.  Denied any other symptoms or any other areas of focal weakness because of her significant history of cerebral vascular disease and her new onset symptoms she was brought to the emergency department for evaluation. In the emergency department the patient was stable with mildly elevated blood pressure.  He demonstrated no new focal neurologic signs or symptoms.  She was pain-free.  Remained at her baseline cognitively.  Laboratory did reveal mild elevation in creatinine to 2.84 slightly above her baseline.  Serum glucose was 226.  CT brain revealed old infarcts in the basal ganglia corona radiata without acute injury.  Given her history and high risk for cerebral events and her instability in regards to gait and movement it was recommended she be admitted hospital service.  Admitted as above with essentially acute instability and dizziness have a acute cerebellar stroke on MRI.  After further discussion with family, patient as well as neurology given patient's history of paroxysmal A.  fib and notable A. fib on telemetry at admission with multiple infarcts of various ages on imaging patient remains extremely high risk for recurrent CVA.  Given her high risk dual antiplatelet therapy was considered not appropriate and patient has been transitioned to Eliquis 2.5 twice daily given patient's advanced age and low GFR.  She has been evaluated by PT OT recommended for inpatient rehab, currently pending insurance approval basement.  Patient otherwise continues to prove daily, indicates her dizziness is ongoing but states that every day she is less dizzy/vertiginous physical therapy.  Hopeful disposition is eventually home.  Patient otherwise medically stable and agreeable for discharge to inpatient rehab pending insurance approval.  Assessment & Plan:   Principal Problem:   Acute arterial ischemic stroke, vertebrobasilar, cerebellar (HCC) Active Problems:   Essential hypertension   Diabetes mellitus type 2, insulin dependent (HCC)   Dementia (HCC)   CKD (chronic kidney disease) stage 4, GFR 15-29 ml/min (HCC)   History of CVA (cerebrovascular accident)   Chronic diastolic heart failure (HCC)   TIA (transient ischemic attack)  Acute cerebellar CVA concurrent ambulatory dysfunction, right, POA, stable improving Per MRI; CT unremarkable at intake Echocardiogram as below without notable vegetation or thrombus. Likely the cause of patient's dizziness and ambulatory dysfunction as above PT following -appreciate insight and recommendations, currently recommending inpatient rehab evaluation -awaiting placement with insurance approval Neurology following, previously discussed continuation of dual antiplatelet therapy to initiate full dose anticoagulation given current CVA with paroxysmal A. fib as below Continue aspirin Plavix, start Eliquis 2.5 Resume HTN medications now out of permissive HTN window as below  Paroxysmal  atrial fibrillation, currently in A. fib, rate controlled not in RVR  Patient currently remains in A. fib, rate controlled, ChadsVasc9 DC Asa/Plavix and start Eliquis 2.5 as above  AKI on CKD 4 minimally improving Creatinine baseline 1.8-1.9 - Currently dow ntrending slightly at 2.8 Likely dehydration/hypovolemic in nature Encourage PO intake Avoid IVF given hx of diastolic HF (EF 29-51% 8841)  Hypertension, poorly controlled Pressure somewhat elevated today, permissive hypertension resolved  Patient regiment includes hydralazine 25mg  twice daily, isosorbide 30mg  at bedtime amlodipine 10 daily Increase isosorbide to 60 daily -received additional 30 mg today x1 Consider increasing hydralazine to 3 times daily remains poorly uncontrolled  Insulin dependent diabetes type 2, well controlled Continue sliding scale insulin/hypoglycemic protocol Lantus 9u HS A1C 6.3 earlier this year  Seizure disorder history Keppra 500xr daily as well as 500 bid noted on med rec Have continued only 500 daily XR dose at this time - unclear if patient on both doses  Depression Continue home celexa  Diastolic HF, not in acute exacerbation, POA EF 55-60% 09/2017 - repeat echo as above Resume core measures once speech clears  DVT prophylaxis:  Eliquis 2.5 Code Status: DNR status was confirmed with her daughter-in-law at admission (POA) Disposition Plan:  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  Objective: Vitals:   08/22/18 1934 08/22/18 2021 08/23/18 0311 08/23/18 0825  BP: (!) 191/54 (!) 178/55 (!) 187/66 (!) 190/58  Pulse:  (!) 47 (!) 44 (!) 49  Resp: 18 15  18 20   Temp:  98.6 F (37 C) 97.9 F (36.6 C) 98.2 F (36.8 C)  TempSrc:  Oral Oral Oral  SpO2:  98% 99% 98%  Weight:      Height:        Intake/Output Summary (Last 24 hours) at 08/23/2018 1118 Last data filed at 08/23/2018 0033 Gross per 24 hour  Intake 480 ml  Output 1025 ml  Net -545 ml   Filed Weights   09/01/2018 2046  Weight: 77.1 kg    Examination:  General:  Pleasantly resting in bed, No acute distress. HEENT:  Normocephalic atraumatic.  Sclerae nonicteric, noninjected.  Extraocular movements intact bilaterally.  No nystagmus noted Neck:  Without mass or deformity.  Trachea is midline. Lungs:  Clear to auscultate bilaterally without rhonchi, wheeze, or rales. Heart:  Regular rate and rhythm.  Without murmurs, rubs, or gallops. Abdomen:  Soft, nontender, nondistended.  Without guarding or rebound. Extremities: Without cyanosis, clubbing, edema, or obvious deformity. Vascular:  Dorsalis pedis and posterior tibial pulses palpable bilaterally. Skin:  Warm and dry, no erythema, no ulcerations.      Data Reviewed: I have personally reviewed following labs and imaging studies  CBC: Recent Labs  Lab 08/23/2018 2050 08/22/18 0702 08/23/18 0357  WBC 7.1 7.5 8.2  HGB 10.4* 9.4* 9.1*  HCT 31.9* 27.4* 26.9*  MCV 92.7 90.1 89.7  PLT 178 158 660   Basic Metabolic Panel: Recent Labs  Lab 09/04/2018 2050 08/22/18 0702 08/23/18 0357  NA 136 140 139  K 4.3 4.3 4.0  CL 110 114* 113*  CO2 20* 19* 20*  GLUCOSE 226* 113* 93  BUN 54* 52* 51*  CREATININE 2.84* 2.90* 2.82*  CALCIUM 8.8* 8.9 8.6*   GFR: Estimated Creatinine Clearance: 15.3 mL/min (A) (by C-G formula based on SCr of 2.82 mg/dL (H)). Liver Function Tests: No results for input(s): AST, ALT, ALKPHOS, BILITOT, PROT, ALBUMIN in the last 168 hours. No results for input(s): LIPASE, AMYLASE in the last 168 hours. No results for input(s): AMMONIA in the last 168 hours. Coagulation Profile: No results for input(s):  INR, PROTIME in the last 168 hours. Cardiac Enzymes: Recent Labs  Lab 09/01/2018 2319  TROPONINI <0.03   BNP (last 3 results) No results for input(s): PROBNP in the last 8760 hours. HbA1C: Recent Labs    08/22/18 0702  HGBA1C 5.7*   CBG: Recent Labs  Lab 08/22/18 0751 08/22/18 1230 08/22/18 1819 08/22/18 2129 08/23/18 0612  GLUCAP 99 258* 98 149* 102*   Lipid Profile: Recent Labs    08/21/18 0504  CHOL 158  HDL 48  LDLCALC 92  TRIG 90  CHOLHDL 3.3   Thyroid Function Tests: Recent Labs    08/24/2018 2319  TSH 1.587   Anemia Panel: No results for input(s): VITAMINB12, FOLATE, FERRITIN, TIBC, IRON, RETICCTPCT in the last 72 hours. Sepsis Labs: No results for input(s): PROCALCITON, LATICACIDVEN in the last 168 hours.  Recent Results (from the past 240 hour(s))  SARS Coronavirus 2 (CEPHEID - Performed in [place] hospital lab), Hosp Order     Status: None   Collection Time: 08/21/18  1:52 AM  Result Value Ref Range Status   SARS Coronavirus 2 NEGATIVE NEGATIVE Final    Comment: (NOTE) If result is NEGATIVE SARS-CoV-2 target nucleic acids are NOT DETECTED. The SARS-CoV-2 RNA is generally detectable in upper and lower  respiratory specimens during the acute phase of infection. The lowest  concentration of SARS-CoV-2 viral copies this assay can detect is 250  copies / mL. A negative result does not preclude SARS-CoV-2 infection  and should not be used as the sole basis for treatment or other  patient management decisions.  A negative result may occur with  improper specimen collection / handling, submission of specimen other  than nasopharyngeal swab, presence of viral mutation(s) within the  areas targeted by this assay, and inadequate number of viral copies  (<250 copies / mL). A negative result must be combined with clinical  observations, patient history, and epidemiological information. If result is POSITIVE SARS-CoV-2 target nucleic acids are DETECTED.  The SARS-CoV-2 RNA is generally detectable in upper and lower  respiratory specimens dur ing the acute phase of infection.  Positive  results are indicative of active infection with SARS-CoV-2.  Clinical  correlation with patient history and other diagnostic information is  necessary to determine patient infection status.  Positive results do  not rule out bacterial infection or co-infection with other viruses. If result is PRESUMPTIVE POSTIVE SARS-CoV-2 nucleic acids MAY BE PRESENT.   A presumptive positive result was obtained on the submitted specimen  and confirmed on repeat testing.  While 2019 novel coronavirus  (SARS-CoV-2) nucleic acids may be present in the submitted sample  additional confirmatory testing may be necessary for epidemiological  and / or clinical management purposes  to differentiate between  SARS-CoV-2 and other Sarbecovirus currently known to infect humans.  If clinically indicated additional testing with an alternate test  methodology [phone]) is advised. The SARS-CoV-2 RNA is generally  detectable in upper and lower respiratory sp ecimens during the acute  phase of infection. The expected result is Negative. Fact Sheet for Patients:  StrictlyIdeas.no Fact Sheet for Healthcare Providers: BankingDealers.co.za This test is not yet approved or cleared by the [place] FDA and has been authorized for detection and/or diagnosis of SARS-CoV-2 by FDA under an Emergency Use Authorization (EUA).  This EUA will remain in effect (meaning this test can be used) for the duration of the COVID-19 declaration under Section 564(b)(1) of the Act, 21 U.S.C. section 360bbb-3(b)(1), unless the authorization is terminated or revoked sooner. Performed at [facility], [place] [address] [place], [place] [address]      Radiology Studies: Mr Virgel Paling OI Contrast  Result Date: 08/21/2018 CLINICAL DATA:  Stroke follow-up  EXAM: MRA HEAD WITHOUT CONTRAST TECHNIQUE: Angiographic images of the Circle of Willis were obtained using MRA technique without intravenous contrast. COMPARISON:  11/03/2017 FINDINGS: The right vertebral artery is likely non dominant. Compared to prior there is now total absence of visible right vertebral and PICA flow, which correlates with the acute infarcts. A small stump is seen at the vertebrobasilar junction. There is atherosclerotic irregularity of the left V4 segment without flow limiting stenosis. High-grade proximal basilar stenosis. High-grade narrowing at the right PCA origin with downstream underfilling when compared to the left. Diffuse atherosclerotic irregularity and narrowing of the cavernous carotids. There is advanced narrowing at the distal left cavernous ICA flow reducing. Extensive atherosclerotic irregularity at bilateral MCA branches and at the bilateral distal M1 segment. No branch occlusion or aneurysm. IMPRESSION: 1. Severe intracranial atherosclerosis. 2. Faint flow in the right vertebral artery and PICA on 2019 comparison is no longer seen and correlates with the acute infarct. 3. High-grade proximal basilar stenosis. 4. High-grade right PCA origin stenosis with downstream underfilling. 5. High-grade left cavernous ICA narrowing. 6. Moderate to advanced bilateral M1 stenoses. Electronically Signed   By: Monte Fantasia M.D.   On: 08/21/2018 16:21   Vas US Carotid  Result Date: 08/22/2018 Carotid Arterial Duplex Study Indications:  CVA. Risk Factors: Hypertension, hyperlipidemia, Diabetes, coronary artery disease,               PAD. Performing Technologist: Maudry Mayhew RDMS, RVT, RDCS  Examination Guidelines: A complete evaluation includes B-mode imaging, spectral Doppler, color Doppler, and power Doppler as needed of all accessible portions of each vessel. Bilateral testing is considered an integral part of a complete examination. Limited examinations for reoccurring  indications may be performed as noted.  Right Carotid Findings: +----------+--------+-------+--------+--------------------------------+--------+           PSV cm/sEDV    StenosisDescribe                        Comments                   cm/s                                                    +----------+--------+-------+--------+--------------------------------+--------+ CCA Prox  71      11                                                      +----------+--------+-------+--------+--------------------------------+--------+ CCA Distal71      12  smooth, heterogenous and                                                  calcific                                 +----------+--------+-------+--------+--------------------------------+--------+ ICA Prox  79      19             heterogenous and calcific                +----------+--------+-------+--------+--------------------------------+--------+ ICA Distal107     22                                                      +----------+--------+-------+--------+--------------------------------+--------+ ECA       269     16                                                      +----------+--------+-------+--------+--------------------------------+--------+ +----------+--------+-------+----------------+-------------------+           PSV cm/sEDV cmsDescribe        Arm Pressure (mmHG) +----------+--------+-------+----------------+-------------------+ [id number]            Multiphasic, WNL                    +----------+--------+-------+----------------+-------------------+ +---------+--------+--+--------+-+---------+ VertebralPSV cm/s32EDV cm/s8Antegrade +---------+--------+--+--------+-+---------+  Left Carotid Findings: +----------+-------+-------+--------+---------------------------------+--------+           PSV    EDV    StenosisDescribe                         Comments            cm/s   cm/s                                                     +----------+-------+-------+--------+---------------------------------+--------+ CCA Prox  50     9              smooth, heterogenous and calcific         +----------+-------+-------+--------+---------------------------------+--------+ CCA Distal74     15             smooth, heterogenous and calcific         +----------+-------+-------+--------+---------------------------------+--------+ ICA Prox  68     12             heterogenous and calcific                 +----------+-------+-------+--------+---------------------------------+--------+ ICA Distal57     9                                                        +----------+-------+-------+--------+---------------------------------+--------+  ECA       49     14             heterogenous, irregular and                                               calcific                                  +----------+-------+-------+--------+---------------------------------+--------+ +----------+--------+--------+----------------+-------------------+ SubclavianPSV cm/sEDV cm/sDescribe        Arm Pressure (mmHG) +----------+--------+--------+----------------+-------------------+           243             Multiphasic, WNL                    +----------+--------+--------+----------------+-------------------+ +---------+--------+--+--------+--+---------+ VertebralPSV cm/s81EDV cm/s19Antegrade +---------+--------+--+--------+--+---------+  Summary: Right Carotid: Velocities in the right ICA are consistent with a 1-39% stenosis. Left Carotid: Velocities in the left ICA are consistent with a 1-39% stenosis. Vertebrals:  Bilateral vertebral arteries demonstrate antegrade flow. Subclavians: Normal flow hemodynamics were seen in bilateral subclavian              arteries. *See table(s) above for measurements and observations.  Electronically signed by Antony Contras  MD on 08/22/2018 at 1:35:24 PM.    Final     Scheduled Meds: .  stroke: mapping our early stages of recovery book   Does not apply Once  . amLODipine  10 mg Oral Daily  . apixaban  2.5 mg Oral BID  . atorvastatin  40 mg Oral Daily  . cholecalciferol  2,000 Units Oral Daily  . citalopram  20 mg Oral Daily  . famotidine  20 mg Oral Daily  . hydrALAZINE  25 mg Oral BID  . insulin aspart  0-9 Units Subcutaneous TID WC  . insulin glargine  9 Units Subcutaneous Daily  . isosorbide mononitrate  30 mg Oral Daily  . levETIRAcetam  500 mg Oral Daily  . omega-3 acid ethyl esters  1,000 mg Oral Daily  . pantoprazole  40 mg Oral Daily  . thiamine  100 mg Oral Once per day on Mon Wed Fri   Continuous Infusions:    LOS: 2 days   Time spent: 23min  Chynah Orihuela C Baillie Mohammad, DO [facility] Pager [phone]  If 7PM-7AM, please contact night-coverage [email or web address] Password [email or web address] 08/23/2018, 11:18 AM

## 2018-08-23 NOTE — Progress Notes (Signed)
Occupational Therapy Treatment Patient Details Name: Destiny [name] MRN: [id number] DOB: [birth year] Today's Date: 08/23/2018    History of present illness 85 y.o. female with medical history significant of cerebral vascular disease with a history of several CVAs and TIAs as recently as November 2018. Admit with sudden onset difficulty with gait/balance.  MRI +small medial R cerebellar infarct.   OT comments  Pt willing to work with OT:  Sitting balance better than during yesterday's session with PT. Pt tends to move quickly with large amplitude movements and min guard needed for safety. Pt c/o HA sitting up.  BP slightly increased.  HA resolved when she returned to supine.  Follow Up Recommendations  CIR    Equipment Recommendations  3 in 1 bedside commode    Recommendations for Other Services      Precautions / Restrictions Precautions Precautions: Fall Precaution Comments: bed/chair alarm, up with assist only (very unsteady) Restrictions Weight Bearing Restrictions: No       Mobility Bed Mobility         Supine to sit: Min guard Sit to supine: Min assist(for R foot)   General bed mobility comments: close guard for safety:  pt moves quickly. Scooted towards eob with min guard; got directionality wrong and initially scooted forward.  Min guard when sitting for safety  Transfers                      Balance   Sitting-balance support: No upper extremity supported;Feet supported Sitting balance-Leahy Scale: Fair Sitting balance - Comments: sat in midline.  when reaching to feet, amplitude of movement was big; no LOB, but did weight shift significantly to R                                   ADL either performed or assessed with clinical judgement   ADL                                         General ADL Comments: sat EOB and adjusted socks. Pt tends to use L hand more, even though she reports she is R dominant. She did use both  socks for R foot.  Pt c/o headache after doing this.  BP increased slightly.  Reports headache resolved when she lied down.  Purewick was being used but had leak. Assisted with hygiene and rolling for new pads/new purewick     Vision       Perception     Praxis      Cognition Arousal/Alertness: Awake/alert Behavior During Therapy: Impulsive(tends to move quickly)                                   General Comments: follows one step commands; tends to move quickly with large movements; got direction incorrect when asked to scoot to L and scooted forward instead        Exercises     Shoulder Instructions       General Comments initial BP 198/51; HR 47.  When c/o headache, BP 201/59; HR 49    Pertinent Vitals/ Pain       Pain Assessment: No/denies pain  Home Living  Prior Functioning/Environment              Frequency  Min 3X/week        Progress Toward Goals  OT Goals(current goals can now be found in the care plan section)  Progress towards OT goals: Progressing toward goals     Plan      Co-evaluation                 AM-PAC OT "6 Clicks" Daily Activity     Outcome Measure   Help from another person eating meals?: None Help from another person taking care of personal grooming?: A Little Help from another person toileting, which includes using toliet, bedpan, or urinal?: A Lot Help from another person bathing (including washing, rinsing, drying)?: A Lot Help from another person to put on and taking off regular upper body clothing?: A Lot Help from another person to put on and taking off regular lower body clothing?: A Lot 6 Click Score: 15    End of Session    OT Visit Diagnosis: Other abnormalities of gait and mobility (R26.89);Other symptoms and signs involving the nervous system (R29.898);Ataxia, unspecified (R27.0)   Activity Tolerance Patient tolerated treatment  well   Patient Left in bed;with call bell/phone within reach;with bed alarm set   Nurse Communication          Time: 3709-6438 OT Time Calculation (min): 22 min  Charges: OT General Charges $OT Visit: 1 Visit OT Treatments $Self Care/Home Management : 8-22 mins  Lesle Chris, OTR/L Acute Rehabilitation Services [phone] [place] pager [phone] office 08/23/2018   Destiny [name] 08/23/2018, 2:55 PM

## 2018-08-23 NOTE — Progress Notes (Signed)
STROKE TEAM PROGRESS NOTE   INTERVAL HISTORY Patient states she is neurologically stable.  She has been started on Eliquis 2.5 mg twice daily.  She is awaiting transfer to inpatient rehab when bed available.  Vitals:   08/22/18 1934 08/22/18 2021 08/23/18 0311 08/23/18 0825  BP: (!) 191/54 (!) 178/55 (!) 187/66 (!) 190/58  Pulse:  (!) 47 (!) 44 (!) 49  Resp: 18 15 18 20   Temp:  98.6 F (37 C) 97.9 F (36.6 C) 98.2 F (36.8 C)  TempSrc:  Oral Oral Oral  SpO2:  98% 99% 98%  Weight:      Height:        CBC:  Recent Labs  Lab 08/22/18 0702 08/23/18 0357  WBC 7.5 8.2  HGB 9.4* 9.1*  HCT 27.4* 26.9*  MCV 90.1 89.7  PLT 158 962    Basic Metabolic Panel:  Recent Labs  Lab 08/22/18 0702 08/23/18 0357  NA 140 139  K 4.3 4.0  CL 114* 113*  CO2 19* 20*  GLUCOSE 113* 93  BUN 52* 51*  CREATININE 2.90* 2.82*  CALCIUM 8.9 8.6*   Lipid Panel:     Component Value Date/Time   CHOL 158 08/21/2018 0504   TRIG 90 08/21/2018 0504   HDL 48 08/21/2018 0504   CHOLHDL 3.3 08/21/2018 0504   VLDL 18 08/21/2018 0504   LDLCALC 92 08/21/2018 0504   HgbA1c:  Lab Results  Component Value Date   HGBA1C 5.7 (H) 08/22/2018   Urine Drug Screen:     Component Value Date/Time   LABOPIA NONE DETECTED 10/31/2017 1955   COCAINSCRNUR NONE DETECTED 10/31/2017 1955   LABBENZ NONE DETECTED 10/31/2017 1955   AMPHETMU NONE DETECTED 10/31/2017 1955   THCU NONE DETECTED 10/31/2017 1955   LABBARB NONE DETECTED 10/31/2017 1955    Alcohol Level     Component Value Date/Time   ETH <10 10/31/2017 1806    IMAGING Mr Jodene Nam Head Wo Contrast  Result Date: 08/21/2018 CLINICAL DATA:  Stroke follow-up EXAM: MRA HEAD WITHOUT CONTRAST TECHNIQUE: Angiographic images of the Circle of Willis were obtained using MRA technique without intravenous contrast. COMPARISON:  11/03/2017 FINDINGS: The right vertebral artery is likely non dominant. Compared to prior there is now total absence of visible right  vertebral and PICA flow, which correlates with the acute infarcts. A small stump is seen at the vertebrobasilar junction. There is atherosclerotic irregularity of the left V4 segment without flow limiting stenosis. High-grade proximal basilar stenosis. High-grade narrowing at the right PCA origin with downstream underfilling when compared to the left. Diffuse atherosclerotic irregularity and narrowing of the cavernous carotids. There is advanced narrowing at the distal left cavernous ICA flow reducing. Extensive atherosclerotic irregularity at bilateral MCA branches and at the bilateral distal M1 segment. No branch occlusion or aneurysm. IMPRESSION: 1. Severe intracranial atherosclerosis. 2. Faint flow in the right vertebral artery and PICA on 2019 comparison is no longer seen and correlates with the acute infarct. 3. High-grade proximal basilar stenosis. 4. High-grade right PCA origin stenosis with downstream underfilling. 5. High-grade left cavernous ICA narrowing. 6. Moderate to advanced bilateral M1 stenoses. Electronically Signed   By: Monte Fantasia M.D.   On: 08/21/2018 16:21   Vas US Carotid  Result Date: 08/22/2018 Carotid Arterial Duplex Study Indications:  CVA. Risk Factors: Hypertension, hyperlipidemia, Diabetes, coronary artery disease,               PAD. Performing Technologist: Maudry Mayhew RDMS, RVT, RDCS  Examination Guidelines: A  complete evaluation includes B-mode imaging, spectral Doppler, color Doppler, and power Doppler as needed of all accessible portions of each vessel. Bilateral testing is considered an integral part of a complete examination. Limited examinations for reoccurring indications may be performed as noted.  Right Carotid Findings: +----------+--------+-------+--------+--------------------------------+--------+           PSV cm/sEDV    StenosisDescribe                        Comments                   cm/s                                                     +----------+--------+-------+--------+--------------------------------+--------+ CCA Prox  71      11                                                      +----------+--------+-------+--------+--------------------------------+--------+ CCA Distal71      12             smooth, heterogenous and                                                  calcific                                 +----------+--------+-------+--------+--------------------------------+--------+ ICA Prox  79      19             heterogenous and calcific                +----------+--------+-------+--------+--------------------------------+--------+ ICA Distal107     22                                                      +----------+--------+-------+--------+--------------------------------+--------+ ECA       269     16                                                      +----------+--------+-------+--------+--------------------------------+--------+ +----------+--------+-------+----------------+-------------------+           PSV cm/sEDV cmsDescribe        Arm Pressure (mmHG) +----------+--------+-------+----------------+-------------------+ [id number]            Multiphasic, WNL                    +----------+--------+-------+----------------+-------------------+ +---------+--------+--+--------+-+---------+ VertebralPSV cm/s32EDV cm/s8Antegrade +---------+--------+--+--------+-+---------+  Left Carotid Findings: +----------+-------+-------+--------+---------------------------------+--------+           PSV    EDV    StenosisDescribe  Comments           cm/s   cm/s                                                     +----------+-------+-------+--------+---------------------------------+--------+ CCA Prox  50     9              smooth, heterogenous and calcific          +----------+-------+-------+--------+---------------------------------+--------+ CCA Distal74     15             smooth, heterogenous and calcific         +----------+-------+-------+--------+---------------------------------+--------+ ICA Prox  68     12             heterogenous and calcific                 +----------+-------+-------+--------+---------------------------------+--------+ ICA Distal57     9                                                        +----------+-------+-------+--------+---------------------------------+--------+ ECA       49     14             heterogenous, irregular and                                               calcific                                  +----------+-------+-------+--------+---------------------------------+--------+ +----------+--------+--------+----------------+-------------------+ SubclavianPSV cm/sEDV cm/sDescribe        Arm Pressure (mmHG) +----------+--------+--------+----------------+-------------------+           243             Multiphasic, WNL                    +----------+--------+--------+----------------+-------------------+ +---------+--------+--+--------+--+---------+ VertebralPSV cm/s81EDV cm/s19Antegrade +---------+--------+--+--------+--+---------+  Summary: Right Carotid: Velocities in the right ICA are consistent with a 1-39% stenosis. Left Carotid: Velocities in the left ICA are consistent with a 1-39% stenosis. Vertebrals:  Bilateral vertebral arteries demonstrate antegrade flow. Subclavians: Normal flow hemodynamics were seen in bilateral subclavian              arteries. *See table(s) above for measurements and observations.  Electronically signed by Antony Contras MD on 08/22/2018 at 1:35:24 PM.    Final     PHYSICAL EXAM Pleasant elderly Caucasian lady currently not in distress. . Afebrile. Head is nontraumatic. Neck is supple without bruit.    Cardiac exam no murmur or gallop. Lungs are  clear to auscultation. Distal pulses are well felt. Neurological Exam ;  Awake  Alert oriented x 3. Normal speech and language.eye movements full without nystagmus but saccadic dysmetria on right lateral gaze..fundi were not visualized. Vision acuity and fields appear normal. Hearing is normal. Palatal movements are normal. Face symmetric. Tongue midline. Normal strength, tone, reflexes and coordination. Normal sensation. Gait deferred.  ASSESSMENT/PLAN Ms. Blanch Media  [name] is a 85 y.o. female with history of dementia, seizures, cerebrovascular disease presenting with unsteadiness.   Stroke:   Small R medial cerebellar infarct in setting of R PICA occlusion, secondary to large vessel disease source vs PAF  CT head No acute stroke. Old L BG, CR, occipital lacunes.   MRI  Small R medial cerebellar infarct. Severe Small vessel disease with mult old infarcts.  MRA  Severe Small vessel disease. R PICA occluded (was open in 2019). High-grade proximal basilar, R PCA origin stenoses. High grade L ICA narrowing. Mod to advanced B M1 stenoses.  Carotid Doppler  B ICA 1-39% stenosis, VAs antegrade   2D Echo normal ejection fraction 55 to 60%.  No cardiac source of embolism.    LDL 92  HgbA1c 5.7  SCDs for VTE prophylaxis  clopidogrel 75 mg daily prior to admission, now on Eliquis for new diagnosis of paroxysmal A. fib    Therapy recommendations:  CIR.   Disposition:  pending   Possible Paroxysmal atrial Fibrillation Hx LLE DVT  Listed on HPI from DR Norins  No mention of AF hx in any neuro notes prior to admission   AF not seen on most recent EKGs  Pt bas been on AC in the past with coumadin for LLE DVT 05/2017  Current Home anticoagulation:  none , was on aspirin and plavix  If PAF dx confirmed, agree with plans for [facility] at d/c  Given renal disease, will likely need warfarin   Hypertension  Stable . Permissive hypertension (OK if < 220/120) but gradually normalize in 5-7  days . Long-term BP goal normotensive  Hyperlipidemia  Home meds:  lipitor 20 and fish oil, resumed in hospital  LDL 92, goal < 70  Increased lipitor to 40 to meet goal  Continue statin at discharge  Diabetes type II Controlled  HgbA1c 5.7, at goal < 7.0  Other Stroke Risk Factors  Advanced age  Former Cigarette smoker, quit 17 yrs ago  Overweight, Body mass index is 27.44 kg/m., recommend weight loss, diet and exercise as appropriate   Hx stroke/TIA  01/2017 -TIA w/ transient speech disturbance and expressive aphasia most likely given stroke risk factors and possible leftICA stenosis.  Cannot rule out complex partial seizure.  No recurrences.  Family hx stroke (father, sister)  Coronary artery disease s/p CABG  PVD  History diastolic heart failure, not in exacerbation  Hx LLE DVT treated with coumadin 05/2017   Other Active Problems  Longstanding mild Alzheimer's dementia   Seizure disorder stable on Keppra   CKD stage  Cr 2.84  Depression on home Celexa  New diagnosis of paroxysmal A. fib during this admission  Hospital day # 2  Continue Eliquis for anticoagulation for paroxysmal A. fib.  Aggressive risk factor modification.  Transfer to inpatient rehab when bed available.  Discussed with Dr. Avon Gully.  Stroke team will sign off.  Follow-up as an outpatient in the stroke clinic in 6 weeks.  Kindly call for questions if any.  Antony Contras, MD Medical Director [facility] Pager: [phone] 08/23/2018 1:32 PM   To contact Stroke Continuity provider, please refer to [email or web address] After hours, contact General Neurology

## 2018-08-24 LAB — BASIC METABOLIC PANEL
Anion gap: 7 (ref 5–15)
BUN: 50 mg/dL — ABNORMAL HIGH (ref 8–23)
CO2: 22 mmol/L (ref 22–32)
Calcium: 8.9 mg/dL (ref 8.9–10.3)
Chloride: 111 mmol/L (ref 98–111)
Creatinine, Ser: 2.77 mg/dL — ABNORMAL HIGH (ref 0.44–1.00)
GFR calc Af Amer: 17 mL/min — ABNORMAL LOW (ref >60)
GFR calc non Af Amer: 15 mL/min — ABNORMAL LOW (ref >60)
Glucose, Bld: 97 mg/dL (ref 70–99)
Potassium: 4 mmol/L (ref 3.5–5.1)
Sodium: 140 mmol/L (ref 135–145)

## 2018-08-24 LAB — GLUCOSE, CAPILLARY
Glucose-Capillary: 93 mg/dL (ref 70–99)
Glucose-Capillary: 168 mg/dL — ABNORMAL HIGH (ref 70–99)
Glucose-Capillary: 181 mg/dL — ABNORMAL HIGH (ref 70–99)
Glucose-Capillary: 166 mg/dL — ABNORMAL HIGH (ref 70–99)

## 2018-08-24 LAB — CBC
HCT: 30 % — ABNORMAL LOW (ref 36.0–46.0)
Hemoglobin: 10.3 g/dL — ABNORMAL LOW (ref 12.0–15.0)
MCH: 30.8 pg (ref 26.0–34.0)
MCHC: 34.3 g/dL (ref 30.0–36.0)
MCV: 89.8 fL (ref 80.0–100.0)
Platelets: 166 10*3/uL (ref 150–400)
RBC: 3.34 MIL/uL — ABNORMAL LOW (ref 3.87–5.11)
RDW: 12.8 % (ref 11.5–15.5)
WBC: 7.8 10*3/uL (ref 4.0–10.5)
nRBC: 0 % (ref 0.0–0.2)

## 2018-08-24 MED ORDER — HYDRALAZINE HCL 25 MG PO TABS
25.0000 mg | ORAL_TABLET | Freq: Three times a day (TID) | ORAL | Status: DC
  Administered 2018-08-24 – 2018-08-25 (×2): 25 mg via ORAL
  Filled 2018-08-24 (×2): qty 1

## 2018-08-24 MED ORDER — HYDRALAZINE HCL 20 MG/ML IJ SOLN
10.0000 mg | Freq: Three times a day (TID) | INTRAMUSCULAR | Status: DC | PRN
  Administered 2018-08-25: 10 mg via INTRAVENOUS
  Filled 2018-08-24: qty 1

## 2018-08-24 NOTE — Progress Notes (Signed)
Inpatient Rehab Admissions Coordinator:   I spoke with pt's son, [name].  He is hopeful for CIR admission possibly today/tomorrow pending insurance authorization and bed availability.  I will continue to follow.   Shann Medal, PT, DPT Admissions Coordinator [phone] 08/24/18  10:12 AM

## 2018-08-24 NOTE — Progress Notes (Signed)
[facility]  PROGRESS NOTE    Destiny [name]  ZOX:[id number] DOB: 08/18/1931 DOA: 08/21/2018 PCP: Mosie Lukes, MD   Brief Narrative:   Destiny Escalona Brooksis a 85 y.o.femalewith medical history significant ofcerebral vascular disease with a history of several CVAs and TIAs as recently as November 2018. Noted on a regular basis by Dr. Leonie Man from neurology. With last office visit in February 2020. Also with history of paroxysmal atrial fibrillation, hypertension, diabetes, hyperlipidemia. Patient has been living with her daughter-in-law and has been doing very well. Is been pendant in her activities of daily living requiring minimal assistance. Chronic medical problems have been well managed and she has actually reduced her use of insulin. Followed on a regular basis by Dr. Randel Pigg.The day on returning home from an outing sudden onset of significant difficulty with imbalance and an inability to walk without significant assistance. Denied any other symptoms or any other areas of focal weakness because of her significant history of cerebral vascular disease and her new onset symptoms she was brought to the emergency department for evaluation.In the emergency department the patient was stable with mildly elevated blood pressure. He demonstrated no new focal neurologic signs or symptoms. She was pain-free. Remained at her baseline cognitively. Laboratory did reveal mild elevation in creatinine to 2.84 slightly above her baseline. Serum glucose was 226.CT brain revealed oldinfarcts in the basal ganglia corona radiata without acute injury. Given her history and high risk for cerebral events and her instability in regards to gait and movement it was recommended she be admitted hospital service.  Admitted as above with essentially acute instability and dizziness have a acute cerebellar stroke on MRI.  After further discussion with family, patient as well as neurology given patient's history of  paroxysmal A. fib and notable A. fib on telemetry at admission with multiple infarcts of various ages on imaging patient remains extremely high risk for recurrent CVA.  Given her high risk dual antiplatelet therapy was considered not appropriate and patient has been transitioned to Eliquis 2.5 twice daily given patient's advanced age and low GFR.  She has been evaluated by PT OT recommended for inpatient rehab, currently pending insurance approval basement.  Patient otherwise continues to prove daily, indicates her dizziness is ongoing but states that every day she is less dizzy/vertiginous physical therapy.  Hopeful disposition is eventually home.  Patient otherwise medically stable and agreeable for discharge to inpatient rehab pending insurance approval.   Assessment & Plan:   Principal Problem:   Acute arterial ischemic stroke, vertebrobasilar, cerebellar (HCC) Active Problems:   Essential hypertension   Diabetes mellitus type 2, insulin dependent (HCC)   Dementia (HCC)   CKD (chronic kidney disease) stage 4, GFR 15-29 ml/min (HCC)   History of CVA (cerebrovascular accident)   Chronic diastolic heart failure (HCC)   TIA (transient ischemic attack)   Acute cerebellar CVA concurrent ambulatory dysfunction, right, POA, stable improving     - Per MRI; CT unremarkable at intake     - Echocardiogram as below without notable vegetation or thrombus.     - Likely the cause of patient's dizziness and ambulatory dysfunction as above     - PT following; currently recommending inpatient rehab evaluation; awaiting placement with insurance approval     - Neurology eval'd, previously discussed discontinuation of dual antiplatelet therapy to initiate full dose anticoagulation given current CVA with paroxysmal A. fib as below     - start Eliquis 2.5     - BP meds resumed  Paroxysmal atrial fibrillation, currently in A. fib, rate controlled not in RVR     - Patient currently remains in A. fib, rate  controlled, ChadsVasc9     - DC'd Asa/Plavix     - Eliquis 2.5 as above  AKI on CKD 4 minimally improving     - Creatinine baseline 1.8-1.9 - Currently downtrending slightly at 2.8     - Likely dehydration/hypovolemic in nature     - Encourage PO intake     - Avoid IVF given hx of diastolic HF (EF 08-14% 4818)  Hypertension, poorly controlled     - Pressure elevated today     - Patient regiment includes hydralazine 25mg  BID, isosorbide 60mg  daily, amlodipine 10 daily     - increase hydralazine to TID dosing; add PRN hydralazine  Insulin dependent diabetes type 2, well controlled     - Continue sliding scale insulin/hypoglycemic protocol     - Lantus 9units HS     - A1C 6.3 earlier this year  Seizure disorder history     - Keppra 500xr daily as well as 500 bid noted on med rec     - Have continued only 500 daily XR dose at this time - unclear if patient on both doses  Depression     - Continue home celexa  Diastolic HF, not in acute exacerbation, POA     - EF 55-60% 09/2017 - repeat echo as above     - Resume core measures once speech clears  Needs better BP control. Increase hydralazine to TID dosing. Awaiting insurance approval for CIR otherwise. Continue as above.    DVT prophylaxis: eliquis Code Status: DNR   Disposition Plan: To CIR after insurance approval   Consultants:   Neurology    Subjective: No acute events ON per staff.   Objective: Vitals:   08/23/18 2321 08/24/18 0515 08/24/18 0831 08/24/18 1142  BP: (!) 173/45 (!) 183/56 (!) 198/67 (!) 185/65  Pulse: (!) 45 (!) 47 (!) 49 (!) 49  Resp: 15   20  Temp: 98 F (36.7 C) 98 F (36.7 C) 98.3 F (36.8 C) 97.9 F (36.6 C)  TempSrc: Oral Oral Oral Oral  SpO2: 99% 99% 98% 96%  Weight:      Height:        Intake/Output Summary (Last 24 hours) at 08/24/2018 1317 Last data filed at 08/24/2018 1000 Gross per 24 hour  Intake 120 ml  Output 700 ml  Net -580 ml   Filed Weights   09/04/2018 2046   Weight: 77.1 kg    Examination: General: 85 y.o. female resting in bed in NAD Cardiovascular: RRR, +S1, S2, no m/g/r, equal pulses throughout Respiratory: CTABL, no w/r/r, normal WOB GI: BS+, NDNT, no masses noted, no organomegaly noted MSK: No e/c/c Skin: No rashes, bruises, ulcerations noted Neuro: somnolent     Data Reviewed: I have personally reviewed following labs and imaging studies.  CBC: Recent Labs  Lab 08/17/2018 2050 08/22/18 0702 08/23/18 0357 08/24/18 0453  WBC 7.1 7.5 8.2 7.8  HGB 10.4* 9.4* 9.1* 10.3*  HCT 31.9* 27.4* 26.9* 30.0*  MCV 92.7 90.1 89.7 89.8  PLT 178 158 167 563   Basic Metabolic Panel: Recent Labs  Lab 09/08/2018 2050 08/22/18 0702 08/23/18 0357 08/24/18 0453  NA 136 140 139 140  K 4.3 4.3 4.0 4.0  CL 110 114* 113* 111  CO2 20* 19* 20* 22  GLUCOSE 226* 113* 93 97  BUN 54* 52* 51* 50*  CREATININE 2.84* 2.90* 2.82* 2.77*  CALCIUM 8.8* 8.9 8.6* 8.9   GFR: Estimated Creatinine Clearance: 15.6 mL/min (A) (by C-G formula based on SCr of 2.77 mg/dL (H)). Liver Function Tests: No results for input(s): AST, ALT, ALKPHOS, BILITOT, PROT, ALBUMIN in the last 168 hours. No results for input(s): LIPASE, AMYLASE in the last 168 hours. No results for input(s): AMMONIA in the last 168 hours. Coagulation Profile: No results for input(s): INR, PROTIME in the last 168 hours. Cardiac Enzymes: Recent Labs  Lab 08/18/2018 2319  TROPONINI <0.03   BNP (last 3 results) No results for input(s): PROBNP in the last 8760 hours. HbA1C: Recent Labs    08/22/18 0702  HGBA1C 5.7*   CBG: Recent Labs  Lab 08/23/18 1200 08/23/18 1635 08/23/18 2131 08/24/18 0604 08/24/18 1136  GLUCAP 143* 127* 145* 93 168*   Lipid Profile: No results for input(s): CHOL, HDL, LDLCALC, TRIG, CHOLHDL, LDLDIRECT in the last 72 hours. Thyroid Function Tests: No results for input(s): TSH, T4TOTAL, FREET4, T3FREE, THYROIDAB in the last 72 hours. Anemia Panel: No results  for input(s): VITAMINB12, FOLATE, FERRITIN, TIBC, IRON, RETICCTPCT in the last 72 hours. Sepsis Labs: No results for input(s): PROCALCITON, LATICACIDVEN in the last 168 hours.  Recent Results (from the past 240 hour(s))  SARS Coronavirus 2 (CEPHEID - Performed in [facility] hospital lab), Hosp Order     Status: None   Collection Time: 08/21/18  1:52 AM  Result Value Ref Range Status   SARS Coronavirus 2 NEGATIVE NEGATIVE Final    Comment: (NOTE) If result is NEGATIVE SARS-CoV-2 target nucleic acids are NOT DETECTED. The SARS-CoV-2 RNA is generally detectable in upper and lower  respiratory specimens during the acute phase of infection. The lowest  concentration of SARS-CoV-2 viral copies this assay can detect is 250  copies / mL. A negative result does not preclude SARS-CoV-2 infection  and should not be used as the sole basis for treatment or other  patient management decisions.  A negative result may occur with  improper specimen collection / handling, submission of specimen other  than nasopharyngeal swab, presence of viral mutation(s) within the  areas targeted by this assay, and inadequate number of viral copies  (<250 copies / mL). A negative result must be combined with clinical  observations, patient history, and epidemiological information. If result is POSITIVE SARS-CoV-2 target nucleic acids are DETECTED. The SARS-CoV-2 RNA is generally detectable in upper and lower  respiratory specimens dur ing the acute phase of infection.  Positive  results are indicative of active infection with SARS-CoV-2.  Clinical  correlation with patient history and other diagnostic information is  necessary to determine patient infection status.  Positive results do  not rule out bacterial infection or co-infection with other viruses. If result is PRESUMPTIVE POSTIVE SARS-CoV-2 nucleic acids MAY BE PRESENT.   A presumptive positive result was obtained on the submitted specimen  and confirmed  on repeat testing.  While 2019 novel coronavirus  (SARS-CoV-2) nucleic acids may be present in the submitted sample  additional confirmatory testing may be necessary for epidemiological  and / or clinical management purposes  to differentiate between  SARS-CoV-2 and other Sarbecovirus currently known to infect humans.  If clinically indicated additional testing with an alternate test  methodology [phone]) is advised. The SARS-CoV-2 RNA is generally  detectable in upper and lower respiratory sp ecimens during the acute  phase of infection. The expected result is Negative. Fact Sheet for Patients:  StrictlyIdeas.no Fact Sheet for  Healthcare Providers: BankingDealers.co.za This test is not yet approved or cleared by the [place] and has been authorized for detection and/or diagnosis of SARS-CoV-2 by FDA under an Emergency Use Authorization (EUA).  This EUA will remain in effect (meaning this test can be used) for the duration of the COVID-19 declaration under Section 564(b)(1) of the Act, 21 U.S.C. section 360bbb-3(b)(1), unless the authorization is terminated or revoked sooner. Performed at [facility], [place] [address], [place], [place] [address]       Radiology Studies: No results found.    Scheduled Meds: .  stroke: mapping our early stages of recovery book   Does not apply Once  . amLODipine  10 mg Oral Daily  . apixaban  2.5 mg Oral BID  . atorvastatin  40 mg Oral Daily  . cholecalciferol  2,000 Units Oral Daily  . citalopram  20 mg Oral Daily  . famotidine  20 mg Oral Daily  . hydrALAZINE  25 mg Oral BID  . insulin aspart  0-9 Units Subcutaneous TID WC  . insulin glargine  9 Units Subcutaneous Daily  . isosorbide mononitrate  60 mg Oral Daily  . levETIRAcetam  500 mg Oral Daily  . omega-3 acid ethyl esters  1,000 mg Oral Daily  . pantoprazole  40 mg Oral Daily  . thiamine  100 mg Oral Once per day on Mon  Wed Fri   Continuous Infusions:   LOS: 3 days    Time spent: 25 minutes spent in the coordination of care today.    Jonnie Finner, DO [facility] Pager [phone]  If 7PM-7AM, please contact night-coverage [email or web address] Password [email or web address] 08/24/2018, 1:17 PM

## 2018-08-24 NOTE — Progress Notes (Signed)
Physical Therapy Treatment Patient Details Name: Destiny [name] MRN: [id number] DOB: 1932/01/03 Today's Date: 08/24/2018    History of Present Illness 85 y.o. female with medical history significant of cerebral vascular disease with a history of several CVAs and TIAs as recently as November 2018. Admit with sudden onset difficulty with gait/balance.  MRI +small medial R cerebellar infarct.    PT Comments    Patient seen for mobility progression. Pt presents with impaired balance/cognition and ataxic movements. Pt stood X 1 trial with mod A +2 for balance and unable to progress to pre gait activities. Max A for lateral scoot to drop arm recliner. Pt reports feeling "drunk" but no dizziness or lightheadedness and did not recall the reason that she is in the hospital. Continue to progress as tolerated.      Follow Up Recommendations  CIR     Equipment Recommendations  Rolling walker with 5" wheels;Other (comment)(TBD next venue)    Recommendations for Other Services       Precautions / Restrictions Precautions Precautions: Fall Restrictions Weight Bearing Restrictions: No    Mobility  Bed Mobility Overal bed mobility: Needs Assistance Bed Mobility: Supine to Sit     Supine to sit: Min guard     General bed mobility comments: min guard for safety; use of rails and increased time and effort  Transfers Overall transfer level: Needs assistance Equipment used: 2 person hand held assist Transfers: Sit to/from Stand;Lateral/Scoot Transfers Sit to Stand: Mod assist;+2 safety/equipment;From elevated surface        Lateral/Scoot Transfers: Max assist General transfer comment: assist to power up into standing and to maintain balance; attempted pre gait activities however pt unable to pick up feet and very fearful of falling; lateral scoot to drop arm recliner with cues for sequencing, anterior translation of trunk, and hand placement  Ambulation/Gait                  Stairs             Wheelchair Mobility    Modified Rankin (Stroke Patients Only) Modified Rankin (Stroke Patients Only) Pre-Morbid Rankin Score: Moderate disability Modified Rankin: Moderately severe disability     Balance Overall balance assessment: Needs assistance Sitting-balance support: Feet supported;Bilateral upper extremity supported Sitting balance-Leahy Scale: Poor     Standing balance support: Bilateral upper extremity supported;During functional activity Standing balance-Leahy Scale: Zero                              Cognition Arousal/Alertness: Awake/alert Behavior During Therapy: WFL for tasks assessed/performed Overall Cognitive Status: Impaired/Different from baseline Area of Impairment: Orientation;Memory;Awareness;Problem solving;Safety/judgement                 Orientation Level: Situation;Disoriented to   Memory: Decreased short-term memory   Safety/Judgement: Decreased awareness of safety;Decreased awareness of deficits   Problem Solving: Requires tactile cues;Requires verbal cues;Slow processing;Decreased initiation        Exercises      General Comments        Pertinent Vitals/Pain      Home Living                      Prior Function            PT Goals (current goals can now be found in the care plan section) Progress towards PT goals: Progressing toward goals    Frequency    Min  4X/week      PT Plan Current plan remains appropriate    Co-evaluation              AM-PAC PT "6 Clicks" Mobility   Outcome Measure  Help needed turning from your back to your side while in a flat bed without using bedrails?: A Little Help needed moving from lying on your back to sitting on the side of a flat bed without using bedrails?: A Little Help needed moving to and from a bed to a chair (including a wheelchair)?: A Lot Help needed standing up from a chair using your arms (e.g., wheelchair or  bedside chair)?: A Little Help needed to walk in hospital room?: Total Help needed climbing 3-5 steps with a railing? : Total 6 Click Score: 13    End of Session Equipment Utilized During Treatment: Gait belt Activity Tolerance: Patient tolerated treatment well Patient left: in chair;with call bell/phone within reach;with chair alarm set;with nursing/sitter in room Nurse Communication: Mobility status PT Visit Diagnosis: Unsteadiness on feet (R26.81);Dizziness and giddiness (R42);Ataxic gait (R26.0)     Time: 4383-8184 PT Time Calculation (min) (ACUTE ONLY): 22 min  Charges:  $Gait Training: 8-22 mins                     Earney Navy, PTA Acute Rehabilitation Services Pager: [phone] Office: [phone]     Darliss Cheney 08/24/2018, 4:05 PM

## 2018-08-25 LAB — MAGNESIUM: Magnesium: 2 mg/dL (ref 1.7–2.4)

## 2018-08-25 LAB — CBC
HCT: 31.6 % — ABNORMAL LOW (ref 36.0–46.0)
Hemoglobin: 10.5 g/dL — ABNORMAL LOW (ref 12.0–15.0)
MCH: 30 pg (ref 26.0–34.0)
MCHC: 33.2 g/dL (ref 30.0–36.0)
MCV: 90.3 fL (ref 80.0–100.0)
Platelets: 181 10*3/uL (ref 150–400)
RBC: 3.5 MIL/uL — ABNORMAL LOW (ref 3.87–5.11)
RDW: 12.9 % (ref 11.5–15.5)
WBC: 9.4 10*3/uL (ref 4.0–10.5)
nRBC: 0 % (ref 0.0–0.2)

## 2018-08-25 LAB — RENAL FUNCTION PANEL
Albumin: 2.8 g/dL — ABNORMAL LOW (ref 3.5–5.0)
Anion gap: 9 (ref 5–15)
BUN: 54 mg/dL — ABNORMAL HIGH (ref 8–23)
CO2: 21 mmol/L — ABNORMAL LOW (ref 22–32)
Calcium: 8.8 mg/dL — ABNORMAL LOW (ref 8.9–10.3)
Chloride: 110 mmol/L (ref 98–111)
Creatinine, Ser: 2.88 mg/dL — ABNORMAL HIGH (ref 0.44–1.00)
GFR calc Af Amer: 17 mL/min — ABNORMAL LOW (ref >60)
GFR calc non Af Amer: 14 mL/min — ABNORMAL LOW (ref >60)
Glucose, Bld: 110 mg/dL — ABNORMAL HIGH (ref 70–99)
Phosphorus: 4.1 mg/dL (ref 2.5–4.6)
Potassium: 3.9 mmol/L (ref 3.5–5.1)
Sodium: 140 mmol/L (ref 135–145)

## 2018-08-25 LAB — GLUCOSE, CAPILLARY
Glucose-Capillary: 97 mg/dL (ref 70–99)
Glucose-Capillary: 194 mg/dL — ABNORMAL HIGH (ref 70–99)

## 2018-08-25 MED ORDER — HYDRALAZINE HCL 50 MG PO TABS
50.0000 mg | ORAL_TABLET | Freq: Three times a day (TID) | ORAL | Status: DC
  Administered 2018-08-25: 50 mg via ORAL
  Filled 2018-08-25: qty 1

## 2018-09-01 ENCOUNTER — Ambulatory Visit: Payer: Medicare Other | Admitting: Adult Health

## 2018-09-14 ENCOUNTER — Ambulatory Visit: Payer: Medicare Other | Admitting: Podiatry

## 2018-09-14 NOTE — Progress Notes (Signed)
Inpatient Rehab Admissions Coordinator:   I received insurance authorization for patient's inpatient rehab admission.  Note pt with hypertension today, MD working on medication adjustment and requests hold admission until BP more stable.  Will continue to follow and look to admit tomorrow possibly, pending medical readiness and bed availability.    Shann Medal, PT, DPT Admissions Coordinator [phone] September 13, 2018  11:21 AM

## 2018-09-14 NOTE — Death Summary Note (Signed)
.    Death Summary  Destiny [name] WNU:[id number] DOB: Sep 14, 1931 DOA: 22-Aug-2018  PCP: Mosie Lukes, MD  Admit date: 08/22/2018 Date of Death: 2018/08/27 Time of Death: 06/06/36 Notification: Mosie Lukes, MD notified of death of 2018-08-27   History of present illness:  Destiny [name] is a 85 y.o. female with a history of CVA, DM2, afib. [name] presented with complaint of Imbalance and inability to ambulate. In the emergency department the patient was stable with mildly elevated blood pressure.  He demonstrated no new focal neurologic signs or symptoms.  She was pain-free.  Remained at her baseline cognitively.  Laboratory did reveal mild elevation in creatinine to 2.84 slightly above her baseline.  Serum glucose was 226.  CT brain revealed old infarcts in the basal ganglia corona radiata without acute injury.  Given her history and high risk for cerebral events and her instability in regards to gait and movement it was recommended she be admitted for observation. Found to have Small acute ischemic infarct within the medial right cerebellum and severe intracranial atherosclerosis. She was being set up for inpatient rehab today. Her blood pressures were elevated throughout the morning and hydralazine was administered. She reported feeling tired this morning, but otherwise ok. This afternoon, nursing reported that she was unresponsive and cyanotic. Upon entering the room, patient was unresponsive to verbal and noxious stimuli. Cardiac and respiratory sounds were absent. There were no carotid or radial pulses. Corneal reflex was absent. She was declared deceased with a TOD of 1538. Her son was notified. No CPR/ACLS was not instituted due to her DNR code status.   Final Diagnoses:  Acute cerebellar CVA Paroxysmal atrial fibrillation AKI on CKD 4 minimally improving Hypertension, poorly controlled Insulin dependent diabetes type 2, well controlled Seizure disorder  history Depression Diastolic HF, not in acute exacerbation, POA   The results of significant diagnostics from this hospitalization (including imaging, microbiology, ancillary and laboratory) are listed below for reference.    Significant Diagnostic Studies: Ct Head Wo Contrast  Result Date: 2018/08/22 CLINICAL DATA:  Weakness EXAM: CT HEAD WITHOUT CONTRAST TECHNIQUE: Contiguous axial images were obtained from the base of the skull through the vertex without intravenous contrast. COMPARISON:  10/31/2017 FINDINGS: Brain: No evidence of acute infarction, hemorrhage, hydrocephalus, extra-axial collection or mass lesion/mass effect. Redemonstrated extensive periventricular white matter hypodensity, including prior lacunar infarctions of the left basal ganglia and corona radiata as well as the right occipital lobe. Vascular: No hyperdense vessel or unexpected calcification. Skull: Normal. Negative for fracture or focal lesion. Sinuses/Orbits: No acute finding. Other: None. IMPRESSION: No acute intracranial pathology. Redemonstrated extensive periventricular white matter hypodensity, including prior lacunar infarctions of the left basal ganglia and corona radiata as well as the right occipital lobe. Consider MRI to more sensitively evaluate for acutely superimposed diffusion restricting infarction if suspected. Electronically Signed   By: Eddie Candle M.D.   On: 22-Aug-2018 22:42   Mr Jodene Nam Head Wo Contrast  Result Date: 08/21/2018 CLINICAL DATA:  Stroke follow-up EXAM: MRA HEAD WITHOUT CONTRAST TECHNIQUE: Angiographic images of the Circle of Willis were obtained using MRA technique without intravenous contrast. COMPARISON:  11/03/2017 FINDINGS: The right vertebral artery is likely non dominant. Compared to prior there is now total absence of visible right vertebral and PICA flow, which correlates with the acute infarcts. A small stump is seen at the vertebrobasilar junction. There is atherosclerotic irregularity  of the left V4 segment without flow limiting stenosis. High-grade proximal basilar stenosis.  High-grade narrowing at the right PCA origin with downstream underfilling when compared to the left. Diffuse atherosclerotic irregularity and narrowing of the cavernous carotids. There is advanced narrowing at the distal left cavernous ICA flow reducing. Extensive atherosclerotic irregularity at bilateral MCA branches and at the bilateral distal M1 segment. No branch occlusion or aneurysm. IMPRESSION: 1. Severe intracranial atherosclerosis. 2. Faint flow in the right vertebral artery and PICA on 2019 comparison is no longer seen and correlates with the acute infarct. 3. High-grade proximal basilar stenosis. 4. High-grade right PCA origin stenosis with downstream underfilling. 5. High-grade left cavernous ICA narrowing. 6. Moderate to advanced bilateral M1 stenoses. Electronically Signed   By: Monte Fantasia M.D.   On: 08/21/2018 16:21   Mr Brain Wo Contrast  Result Date: 08/21/2018 CLINICAL DATA:  Weakness and difficulty walking EXAM: MRI HEAD WITHOUT CONTRAST TECHNIQUE: Multiplanar, multiecho pulse sequences of the brain and surrounding structures were obtained without intravenous contrast. COMPARISON:  Head CT 08/19/2018 Brain MRI 11/03/2017 FINDINGS: BRAIN: Small area of acute ischemia within the medial right cerebellum. No other diffusion abnormality. The midline structures are normal. Multiple old infarcts, including the right occipital lobe and multiple sites of the deep gray nuclei. Early confluent hyperintense T2-weighted signal of the periventricular and deep white matter, most commonly due to chronic ischemic microangiopathy. Generalized atrophy without lobar predilection. Susceptibility-sensitive sequences show no chronic microhemorrhage or superficial siderosis. No mass lesion. VASCULAR: The major intracranial arterial and venous sinus flow voids are normal. SKULL AND UPPER CERVICAL SPINE: Calvarial bone  marrow signal is normal. There is no skull base mass. Visualized upper cervical spine and soft tissues are normal. SINUSES/ORBITS: No fluid levels or advanced mucosal thickening. No mastoid or middle ear effusion. The orbits are normal. IMPRESSION: 1. Small acute ischemic infarct within the medial right cerebellum. No hemorrhage or mass effect. 2. Severe chronic ischemic microangiopathy with multiple old infarcts. Electronically Signed   By: Ulyses Jarred M.D.   On: 08/21/2018 03:54   Dg Chest Portable 1 View  Result Date: 08/27/2018 CLINICAL DATA:  85 year old female with weakness. EXAM: PORTABLE CHEST 1 VIEW COMPARISON:  Chest radiograph dated 10/31/2017 FINDINGS: Minimal left lung base atelectatic changes. No focal consolidation, pleural effusion, or pneumothorax. Stable cardiomegaly. Median sternotomy wires and CABG vascular clips. No acute osseous pathology. IMPRESSION: No active disease. Electronically Signed   By: Anner Crete M.D.   On: 09/13/2018 22:15   Vas US Carotid  Result Date: 08/22/2018 Carotid Arterial Duplex Study Indications:  CVA. Risk Factors: Hypertension, hyperlipidemia, Diabetes, coronary artery disease,               PAD. Performing Technologist: Maudry Mayhew RDMS, RVT, RDCS  Examination Guidelines: A complete evaluation includes B-mode imaging, spectral Doppler, color Doppler, and power Doppler as needed of all accessible portions of each vessel. Bilateral testing is considered an integral part of a complete examination. Limited examinations for reoccurring indications may be performed as noted.  Right Carotid Findings: +----------+--------+-------+--------+--------------------------------+--------+             PSV cm/s EDV     Stenosis Describe                         Comments                       cm/s                                                        +----------+--------+-------+--------+--------------------------------+--------+  CCA Prox   71       11                                                           +----------+--------+-------+--------+--------------------------------+--------+  CCA Distal 71       12               smooth, heterogenous and                                                         calcific                                   +----------+--------+-------+--------+--------------------------------+--------+  ICA Prox   79       19               heterogenous and calcific                  +----------+--------+-------+--------+--------------------------------+--------+  ICA Distal 107      22                                                          +----------+--------+-------+--------+--------------------------------+--------+  ECA        269      16                                                          +----------+--------+-------+--------+--------------------------------+--------+ +----------+--------+-------+----------------+-------------------+             PSV cm/s EDV cms Describe         Arm Pressure (mmHG)  +----------+--------+-------+----------------+-------------------+  Subclavian 178              Multiphasic, WNL                      +----------+--------+-------+----------------+-------------------+ +---------+--------+--+--------+-+---------+  Vertebral PSV cm/s 32 EDV cm/s 8 Antegrade  +---------+--------+--+--------+-+---------+  Left Carotid Findings: +----------+-------+-------+--------+---------------------------------+--------+             PSV     EDV     Stenosis Describe                          Comments              cm/s    cm/s                                                         +----------+-------+-------+--------+---------------------------------+--------+  CCA Prox   50  9                smooth, heterogenous and calcific           +----------+-------+-------+--------+---------------------------------+--------+  CCA Distal 74      15               smooth, heterogenous and calcific            +----------+-------+-------+--------+---------------------------------+--------+  ICA Prox   68      12               heterogenous and calcific                   +----------+-------+-------+--------+---------------------------------+--------+  ICA Distal 57      9                                                            +----------+-------+-------+--------+---------------------------------+--------+  ECA        49      14               heterogenous, irregular and                                                      calcific                                    +----------+-------+-------+--------+---------------------------------+--------+ +----------+--------+--------+----------------+-------------------+  Subclavian PSV cm/s EDV cm/s Describe         Arm Pressure (mmHG)  +----------+--------+--------+----------------+-------------------+             243               Multiphasic, WNL                      +----------+--------+--------+----------------+-------------------+ +---------+--------+--+--------+--+---------+  Vertebral PSV cm/s 81 EDV cm/s 19 Antegrade  +---------+--------+--+--------+--+---------+  Summary: Right Carotid: Velocities in the right ICA are consistent with a 1-39% stenosis. Left Carotid: Velocities in the left ICA are consistent with a 1-39% stenosis. Vertebrals:  Bilateral vertebral arteries demonstrate antegrade flow. Subclavians: Normal flow hemodynamics were seen in bilateral subclavian              arteries. *See table(s) above for measurements and observations.  Electronically signed by Antony Contras MD on 08/22/2018 at 1:35:24 PM.    Final     Microbiology: Recent Results (from the past 240 hour(s))  SARS Coronavirus 2 (CEPHEID - Performed in [place] hospital lab), Hosp Order     Status: None   Collection Time: 08/21/18  1:52 AM   Specimen: Nasopharyngeal Swab  Result Value Ref Range Status   SARS Coronavirus 2 NEGATIVE NEGATIVE Final    Comment: (NOTE) If result is  NEGATIVE SARS-CoV-2 target nucleic acids are NOT DETECTED. The SARS-CoV-2 RNA is generally detectable in upper and lower  respiratory specimens during the acute phase of infection. The lowest  concentration of SARS-CoV-2 viral copies this assay can detect is 250  copies / mL. A negative result does not preclude SARS-CoV-2 infection  and should not be used as the sole basis for treatment or other  patient management decisions.  A negative result may occur with  improper specimen collection / handling, submission of specimen other  than nasopharyngeal swab, presence of viral mutation(s) within the  areas targeted by this assay, and inadequate number of viral copies  (<250 copies / mL). A negative result must be combined with clinical  observations, patient history, and epidemiological information. If result is POSITIVE SARS-CoV-2 target nucleic acids are DETECTED. The SARS-CoV-2 RNA is generally detectable in upper and lower  respiratory specimens dur ing the acute phase of infection.  Positive  results are indicative of active infection with SARS-CoV-2.  Clinical  correlation with patient history and other diagnostic information is  necessary to determine patient infection status.  Positive results do  not rule out bacterial infection or co-infection with other viruses. If result is PRESUMPTIVE POSTIVE SARS-CoV-2 nucleic acids MAY BE PRESENT.   A presumptive positive result was obtained on the submitted specimen  and confirmed on repeat testing.  While 2019 novel coronavirus  (SARS-CoV-2) nucleic acids may be present in the submitted sample  additional confirmatory testing may be necessary for epidemiological  and / or clinical management purposes  to differentiate between  SARS-CoV-2 and other Sarbecovirus currently known to infect humans.  If clinically indicated additional testing with an alternate test  methodology [id number]) is advised. The SARS-CoV-2 RNA is generally  detectable  in upper and lower respiratory sp ecimens during the acute  phase of infection. The expected result is Negative. Fact Sheet for Patients:  StrictlyIdeas.no Fact Sheet for Healthcare Providers: BankingDealers.co.za This test is not yet approved or cleared by the [place] FDA and has been authorized for detection and/or diagnosis of SARS-CoV-2 by FDA under an Emergency Use Authorization (EUA).  This EUA will remain in effect (meaning this test can be used) for the duration of the COVID-19 declaration under Section 564(b)(1) of the Act, 21 U.S.C. section 360bbb-3(b)(1), unless the authorization is terminated or revoked sooner. Performed at [facility], [place] [address], [place], [place] [address]      Labs: Basic Metabolic Panel: Recent Labs  Lab 08/18/2018 2050 08/22/18 0702 08/23/18 0357 08/24/18 0453 09/14/2018 0405  NA 136 140 139 140 140  K 4.3 4.3 4.0 4.0 3.9  CL 110 114* 113* 111 110  CO2 20* 19* 20* 22 21*  GLUCOSE 226* 113* 93 97 110*  BUN 54* 52* 51* 50* 54*  CREATININE 2.84* 2.90* 2.82* 2.77* 2.88*  CALCIUM 8.8* 8.9 8.6* 8.9 8.8*  MG  --   --   --   --  2.0  PHOS  --   --   --   --  4.1   Liver Function Tests: Recent Labs  Lab 09-14-2018 0405  ALBUMIN 2.8*   No results for input(s): LIPASE, AMYLASE in the last 168 hours. No results for input(s): AMMONIA in the last 168 hours. CBC: Recent Labs  Lab 09/11/2018 2050 08/22/18 0702 08/23/18 0357 08/24/18 0453 2018/09/14 0405  WBC 7.1 7.5 8.2 7.8 9.4  HGB 10.4* 9.4* 9.1* 10.3* 10.5*  HCT 31.9* 27.4* 26.9* 30.0* 31.6*  MCV 92.7 90.1 89.7 89.8 90.3  PLT 178 158 167 166 181   Cardiac Enzymes: Recent Labs  Lab 09/09/2018 2319  TROPONINI <0.03   D-Dimer No results for input(s): DDIMER in the last 72 hours. BNP: Invalid input(s): POCBNP CBG: Recent Labs  Lab 08/24/18 1136 08/24/18 1752 08/24/18 2135 Sep 14, 2018  9024 September 08, 2018 1154  GLUCAP 168* 181* 166*  97 194*   Anemia work up No results for input(s): VITAMINB12, FOLATE, FERRITIN, TIBC, IRON, RETICCTPCT in the last 72 hours. Urinalysis    Component Value Date/Time   COLORURINE AMBER (A) 08/21/2018 1203   APPEARANCEUR CLOUDY (A) 08/21/2018 1203   LABSPEC 1.014 08/21/2018 1203   PHURINE 8.0 08/21/2018 1203   GLUCOSEU NEGATIVE 08/21/2018 1203   GLUCOSEU 100 (A) 01/26/2017 1114   HGBUR NEGATIVE 08/21/2018 1203   HGBUR large 04/26/2009 1110   BILIRUBINUR NEGATIVE 08/21/2018 1203   BILIRUBINUR neg 10/28/2017 1132   KETONESUR NEGATIVE 08/21/2018 1203   PROTEINUR >=300 (A) 08/21/2018 1203   UROBILINOGEN 0.2 10/28/2017 1132   UROBILINOGEN 0.2 01/26/2017 1114   NITRITE NEGATIVE 08/21/2018 1203   LEUKOCYTESUR LARGE (A) 08/21/2018 1203   Sepsis Labs Invalid input(s): PROCALCITONIN,  WBC,  LACTICIDVEN   SIGNED:  Jonnie Finner, MD  [facility] 08-Sep-2018, 4:31 PM Pager   If 7PM-7AM, please contact night-coverage [email or web address] Password [email or web address]

## 2018-09-14 NOTE — Progress Notes (Signed)
Marland Kitchen  PROGRESS NOTE    NATOSHA BOU  QPY:[id number] DOB: [birth year] DOA: 08/15/2018 PCP: Mosie Lukes, MD   Brief Narrative:   Destiny Wallander Brooksis a 85 y.o.femalewith medical history significant ofcerebral vascular disease with a history of several CVAs and TIAs as recently as November 2018. Noted on a regular basis by Dr. Leonie Man from neurology. With last office visit in February 2020. Also with history of paroxysmal atrial fibrillation, hypertension, diabetes, hyperlipidemia. Patient has been living with her daughter-in-law and has been doing very well. Is been pendant in her activities of daily living requiring minimal assistance. Chronic medical problems have been well managed and she has actually reduced her use of insulin. Followed on a regular basis by Dr. Randel Pigg.The day on returning home from an outing sudden onset of significant difficulty with imbalance and an inability to walk without significant assistance. Denied any other symptoms or any other areas of focal weakness because of her significant history of cerebral vascular disease and her new onset symptoms she was brought to the emergency department for evaluation.In the emergency department the patient was stable with mildly elevated blood pressure. He demonstrated no new focal neurologic signs or symptoms. She was pain-free. Remained at her baseline cognitively. Laboratory did reveal mild elevation in creatinine to 2.84 slightly above her baseline. Serum glucose was 226.CT brain revealed oldinfarcts in the basal ganglia corona radiata without acute injury. Given her history and high risk for cerebral events and her instability in regards to gait and movement it was recommended she be admittedhospital service.  Admitted as above with essentially acute instability and dizziness have a acute cerebellar stroke on MRI. After further discussion with family, patient as well as neurology given patient's history of  paroxysmal A. fib and notable A. fib on telemetry at admission with multiple infarcts of various ages on imaging patient remains extremely high risk for recurrent CVA. Given her high risk dual antiplatelet therapy was considered not appropriate and patient has been transitioned to Eliquis 2.5 twice daily given patient's advanced age and low GFR. She has been evaluated by PT OT recommended for inpatient rehab, currently pending insurance approval basement. Patient otherwise continues to prove daily, indicates her dizziness is ongoing but states that every day she is less dizzy/vertiginous physical therapy. Hopeful disposition is eventually home. Patient otherwise medically stable and agreeable for discharge to inpatient rehab pending insurance approval.   Assessment & Plan:   Principal Problem:   Acute arterial ischemic stroke, vertebrobasilar, cerebellar (HCC) Active Problems:   Essential hypertension   Diabetes mellitus type 2, insulin dependent (HCC)   Dementia (HCC)   CKD (chronic kidney disease) stage 4, GFR 15-29 ml/min (HCC)   History of CVA (cerebrovascular accident)   Chronic diastolic heart failure (HCC)   TIA (transient ischemic attack)   Acute cerebellar CVA concurrent ambulatory dysfunction, right, POA, stable improving     - Per MRI; CT unremarkable at intake     - Echocardiogram as below without notable vegetation or thrombus.     - Likely the cause of patient's dizziness and ambulatory dysfunction as above     - PT following; currently recommending inpatient rehab evaluation; awaiting placement with insurance approval     - Neurology eval'd, previously discussed discontinuation of dual antiplatelet therapy to initiate full dose anticoagulation given current CVA with paroxysmal A. fib as below     - start Eliquis 2.5     - BP meds resumed     - hydralazine added  as BP up to 200 today  Paroxysmal atrial fibrillation, currently in A. fib, rate controlled not in RVR     -  Patient currently remains in A. fib, rate controlled, ChadsVasc9     - DC'd Asa/Plavix     - Eliquis 2.5 as above  AKI on CKD 4 minimally improving     - Creatinine baseline 1.8-1.9 - Currently downtrending slightly at 2.8     - Likely dehydration/hypovolemic in nature     - Encourage PO intake     - Avoid IVF given hx of diastolic HF (EF 06-30% 1601)  Hypertension, poorly controlled     - Pressure elevated today     - Patient regiment includes hydralazine 25mg  BID, isosorbide 60mg  daily, amlodipine 10 daily     - increase hydralazine to TID dosing; add PRN hydralazine  Insulin dependent diabetes type 2, well controlled     - Continue sliding scale insulin/hypoglycemic protocol     - Lantus 9units HS     - A1C 6.3 earlier this year  Seizure disorder history     - Keppra 500xr daily as well as 500 bid noted on med rec     - Have continued only 500 daily XR dose at this time - unclear if patient on both doses  Depression     - Continue home celexa  Diastolic HF, not in acute exacerbation, POA     - EF 55-60% 09/2017 - repeat echo as above     - Resume core measures once speech clears  SBP up to 200 today. Adding hydralazine. Will d/c to CIR in AM if BP stable on new meds.   DVT prophylaxis: eliquis Code Status: DNR Family Communication: Spoke with son by phone Disposition Plan: To CIR after insurance approval   Consultants:   Neurology   Subjective: "Ok, thank you."  Objective: Vitals:   08/24/18 1600 08/24/18 1945 02-Sep-2018 0019 09-02-18 0402  BP:  (!) 183/69  (!) 171/85  Pulse: (!) 51 (!) 54 60 62  Resp:      Temp: (!) 97.5 F (36.4 C) 97.6 F (36.4 C) 98.4 F (36.9 C) 97.6 F (36.4 C)  TempSrc: Oral Oral Oral Oral  SpO2: 98% 99% 98% 98%  Weight:      Height:        Intake/Output Summary (Last 24 hours) at Sep 02, 2018 0932 Last data filed at 2018/09/02 0020 Gross per 24 hour  Intake 120 ml  Output 300 ml  Net -180 ml   Filed Weights   09/04/2018  2046  Weight: 77.1 kg    Examination:  General: 85 y.o. female resting in bed in NAD Cardiovascular: RRR, +S1, S2, no m/g/r, equal pulses throughout Respiratory: CTABL, no w/r/r, normal WOB GI: BS+, NDNT, no masses noted, no organomegaly noted MSK: No e/c/c Skin: No rashes, bruises, ulcerations noted Neuro: alert to name, follows commands, somewhat slow today    Data Reviewed: I have personally reviewed following labs and imaging studies.  CBC: Recent Labs  Lab 08/19/2018 2050 08/22/18 0702 08/23/18 0357 08/24/18 0453 2018-09-02 0405  WBC 7.1 7.5 8.2 7.8 9.4  HGB 10.4* 9.4* 9.1* 10.3* 10.5*  HCT 31.9* 27.4* 26.9* 30.0* 31.6*  MCV 92.7 90.1 89.7 89.8 90.3  PLT 178 158 167 166 355   Basic Metabolic Panel: Recent Labs  Lab 09/08/2018 2050 08/22/18 0702 08/23/18 0357 08/24/18 0453 09/02/2018 0405  NA 136 140 139 140 140  K 4.3 4.3 4.0 4.0 3.9  CL  110 114* 113* 111 110  CO2 20* 19* 20* 22 21*  GLUCOSE 226* 113* 93 97 110*  BUN 54* 52* 51* 50* 54*  CREATININE 2.84* 2.90* 2.82* 2.77* 2.88*  CALCIUM 8.8* 8.9 8.6* 8.9 8.8*  MG  --   --   --   --  2.0  PHOS  --   --   --   --  4.1   GFR: Estimated Creatinine Clearance: 15 mL/min (A) (by C-G formula based on SCr of 2.88 mg/dL (H)). Liver Function Tests: Recent Labs  Lab 2018-09-12 0405  ALBUMIN 2.8*   No results for input(s): LIPASE, AMYLASE in the last 168 hours. No results for input(s): AMMONIA in the last 168 hours. Coagulation Profile: No results for input(s): INR, PROTIME in the last 168 hours. Cardiac Enzymes: Recent Labs  Lab 09/13/2018 2319  TROPONINI <0.03   BNP (last 3 results) No results for input(s): PROBNP in the last 8760 hours. HbA1C: No results for input(s): HGBA1C in the last 72 hours. CBG: Recent Labs  Lab 08/24/18 0604 08/24/18 1136 08/24/18 1752 08/24/18 2135 09/12/2018 0616  GLUCAP 93 168* 181* 166* 97   Lipid Profile: No results for input(s): CHOL, HDL, LDLCALC, TRIG, CHOLHDL, LDLDIRECT  in the last 72 hours. Thyroid Function Tests: No results for input(s): TSH, T4TOTAL, FREET4, T3FREE, THYROIDAB in the last 72 hours. Anemia Panel: No results for input(s): VITAMINB12, FOLATE, FERRITIN, TIBC, IRON, RETICCTPCT in the last 72 hours. Sepsis Labs: No results for input(s): PROCALCITON, LATICACIDVEN in the last 168 hours.  Recent Results (from the past 240 hour(s))  SARS Coronavirus 2 (CEPHEID - Performed in [place] hospital lab), Hosp Order     Status: None   Collection Time: 08/21/18  1:52 AM   Specimen: Nasopharyngeal Swab  Result Value Ref Range Status   SARS Coronavirus 2 NEGATIVE NEGATIVE Final    Comment: (NOTE) If result is NEGATIVE SARS-CoV-2 target nucleic acids are NOT DETECTED. The SARS-CoV-2 RNA is generally detectable in upper and lower  respiratory specimens during the acute phase of infection. The lowest  concentration of SARS-CoV-2 viral copies this assay can detect is 250  copies / mL. A negative result does not preclude SARS-CoV-2 infection  and should not be used as the sole basis for treatment or other  patient management decisions.  A negative result may occur with  improper specimen collection / handling, submission of specimen other  than nasopharyngeal swab, presence of viral mutation(s) within the  areas targeted by this assay, and inadequate number of viral copies  (<250 copies / mL). A negative result must be combined with clinical  observations, patient history, and epidemiological information. If result is POSITIVE SARS-CoV-2 target nucleic acids are DETECTED. The SARS-CoV-2 RNA is generally detectable in upper and lower  respiratory specimens dur ing the acute phase of infection.  Positive  results are indicative of active infection with SARS-CoV-2.  Clinical  correlation with patient history and other diagnostic information is  necessary to determine patient infection status.  Positive results do  not rule out bacterial infection or  co-infection with other viruses. If result is PRESUMPTIVE POSTIVE SARS-CoV-2 nucleic acids MAY BE PRESENT.   A presumptive positive result was obtained on the submitted specimen  and confirmed on repeat testing.  While 2019 novel coronavirus  (SARS-CoV-2) nucleic acids may be present in the submitted sample  additional confirmatory testing may be necessary for epidemiological  and / or clinical management purposes  to differentiate between  SARS-CoV-2  and other Sarbecovirus currently known to infect humans.  If clinically indicated additional testing with an alternate test  methodology [phone]) is advised. The SARS-CoV-2 RNA is generally  detectable in upper and lower respiratory sp ecimens during the acute  phase of infection. The expected result is Negative. Fact Sheet for Patients:  StrictlyIdeas.no Fact Sheet for Healthcare Providers: BankingDealers.co.za This test is not yet approved or cleared by the [place] FDA and has been authorized for detection and/or diagnosis of SARS-CoV-2 by FDA under an Emergency Use Authorization (EUA).  This EUA will remain in effect (meaning this test can be used) for the duration of the COVID-19 declaration under Section 564(b)(1) of the Act, 21 U.S.C. section 360bbb-3(b)(1), unless the authorization is terminated or revoked sooner. Performed at [facility], [place] [address], [place], [place] [address]          Radiology Studies: No results found.      Scheduled Meds: .  stroke: mapping our early stages of recovery book   Does not apply Once  . amLODipine  10 mg Oral Daily  . apixaban  2.5 mg Oral BID  . atorvastatin  40 mg Oral Daily  . cholecalciferol  2,000 Units Oral Daily  . citalopram  20 mg Oral Daily  . famotidine  20 mg Oral Daily  . hydrALAZINE  25 mg Oral Q8H  . insulin aspart  0-9 Units Subcutaneous TID WC  . insulin glargine  9 Units Subcutaneous Daily  .  isosorbide mononitrate  60 mg Oral Daily  . levETIRAcetam  500 mg Oral Daily  . omega-3 acid ethyl esters  1,000 mg Oral Daily  . pantoprazole  40 mg Oral Daily  . thiamine  100 mg Oral Once per day on Mon Wed Fri   Continuous Infusions:   LOS: 4 days    Time spent: 25 minutes spent in the coordination of care today.    Jonnie Finner, DO [facility] Pager [phone]  If 7PM-7AM, please contact night-coverage [email or web address] Password [facility] 09-24-18, 7:12 AM

## 2018-09-14 NOTE — Progress Notes (Signed)
Physical Therapy Treatment Patient Details Name: Destiny [name] MRN: [id number] DOB: Aug 03, 1931 Today's Date: 2018-08-27    History of Present Illness 85 y.o. female with medical history significant of cerebral vascular disease with a history of several CVAs and TIAs as recently as November 2018. Admit with sudden onset difficulty with gait/balance.  MRI +small medial R cerebellar infarct.    PT Comments    Patient seen for mobility progression. Pt continues to be oriented to person and place only. Pt presents with increased drowsiness and R lateral lean this session compared to previous session. Pt requires max A +2 to stand from EOB utilizing Stedy standing frame and max A to maintain standing balance due poor proprioception, feeling of dizziness, and heavy R lateral bias. Pt back to bed end of session for patient safety. Continue to progress as tolerated.     Follow Up Recommendations  CIR     Equipment Recommendations  Other (comment)(TBD next venue)    Recommendations for Other Services       Precautions / Restrictions Precautions Precautions: Fall Restrictions Weight Bearing Restrictions: No    Mobility  Bed Mobility Overal bed mobility: Needs Assistance Bed Mobility: Supine to Sit;Sit to Supine     Supine to sit: Min assist;Mod assist Sit to supine: Max assist;+2 for physical assistance   General bed mobility comments: assist to stabilize trunk while coming into sitting and then to guide trunk back to supine and bring bilat LE into bed  Transfers Overall transfer level: Needs assistance   Transfers: Sit to/from Stand Sit to Stand: Mod assist;+2 safety/equipment;From elevated surface;Max assist         General transfer comment: assist to position feet and to maintain sitting balance prior to standing utilizing Stedy standing frame; assistance to power up into standing and to maintain balance in standing; very heavy R lateral lean  Ambulation/Gait                  Stairs             Wheelchair Mobility    Modified Rankin (Stroke Patients Only) Modified Rankin (Stroke Patients Only) Pre-Morbid Rankin Score: Moderate disability Modified Rankin: Severe disability     Balance Overall balance assessment: Needs assistance Sitting-balance support: Feet supported;Bilateral upper extremity supported Sitting balance-Leahy Scale: Poor   Postural control: Posterior lean;Right lateral lean Standing balance support: Bilateral upper extremity supported;During functional activity Standing balance-Leahy Scale: Zero                              Cognition Arousal/Alertness: Awake/alert Behavior During Therapy: WFL for tasks assessed/performed Overall Cognitive Status: Impaired/Different from baseline Area of Impairment: Orientation;Memory;Awareness;Problem solving;Safety/judgement                 Orientation Level: Situation;Disoriented to;Time   Memory: Decreased short-term memory   Safety/Judgement: Decreased awareness of safety;Decreased awareness of deficits   Problem Solving: Requires tactile cues;Requires verbal cues;Slow processing;Decreased initiation;Difficulty sequencing        Exercises      General Comments        Pertinent Vitals/Pain Pain Assessment: No/denies pain    Home Living                      Prior Function            PT Goals (current goals can now be found in the care plan section) Progress towards PT goals:  Progressing toward goals    Frequency    Min 4X/week      PT Plan Current plan remains appropriate    Co-evaluation              AM-PAC PT "6 Clicks" Mobility   Outcome Measure  Help needed turning from your back to your side while in a flat bed without using bedrails?: A Little Help needed moving from lying on your back to sitting on the side of a flat bed without using bedrails?: A Little Help needed moving to and from a bed to a chair  (including a wheelchair)?: A Lot Help needed standing up from a chair using your arms (e.g., wheelchair or bedside chair)?: A Lot Help needed to walk in hospital room?: Total Help needed climbing 3-5 steps with a railing? : Total 6 Click Score: 12    End of Session Equipment Utilized During Treatment: Gait belt Activity Tolerance: Patient tolerated treatment well Patient left: with call bell/phone within reach;with nursing/sitter in room;in bed;with bed alarm set Nurse Communication: Mobility status PT Visit Diagnosis: Unsteadiness on feet (R26.81);Dizziness and giddiness (R42);Ataxic gait (R26.0)     Time: 1011-1040 PT Time Calculation (min) (ACUTE ONLY): 29 min  Charges:  $Gait Training: 8-22 mins $Therapeutic Activity: 8-22 mins                     Earney Navy, PTA Acute Rehabilitation Services Pager: [phone] Office: [phone]     Darliss Cheney 2018-08-31, 10:56 AM

## 2018-09-14 DEATH — deceased

## 2019-06-23 IMAGING — MR MRA HEAD WITHOUT CONTRAST
1 series · 19 of 48 positions shown · non-contrast
Comparison: 11/03/2017

CLINICAL DATA: Stroke follow-up

EXAM:
MRA HEAD WITHOUT CONTRAST
TECHNIQUE: Angiographic images of the Circle of Willis were obtained using MRA
technique without intravenous contrast.

[Series 3: ax (id) · axial · 1.0mm · 0.43mm/px · z∈[-84,+3]mm · 19 of 184 slices shown]
[im 1/184]
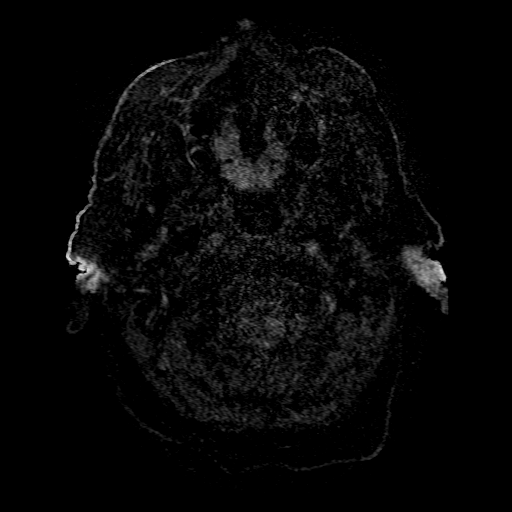
[im 4/184]
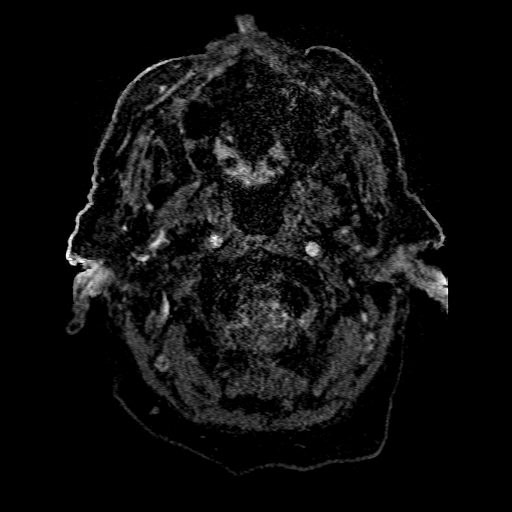
[im 8/184]
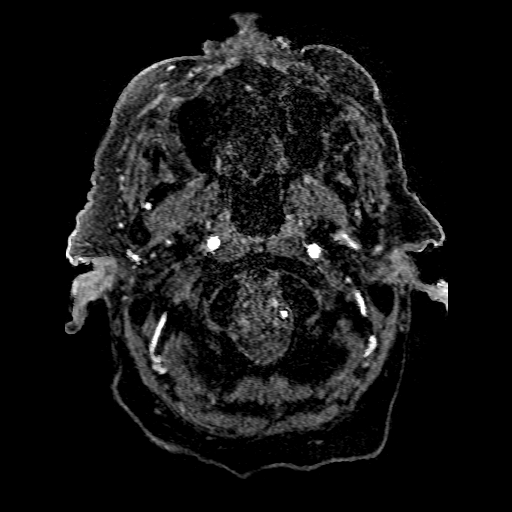
[im 12/184]
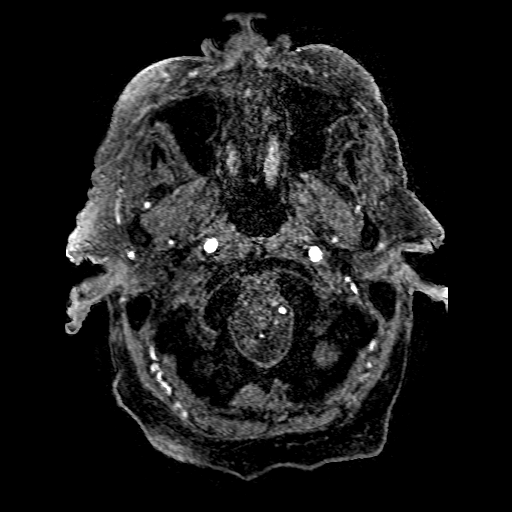
[im 16/184]
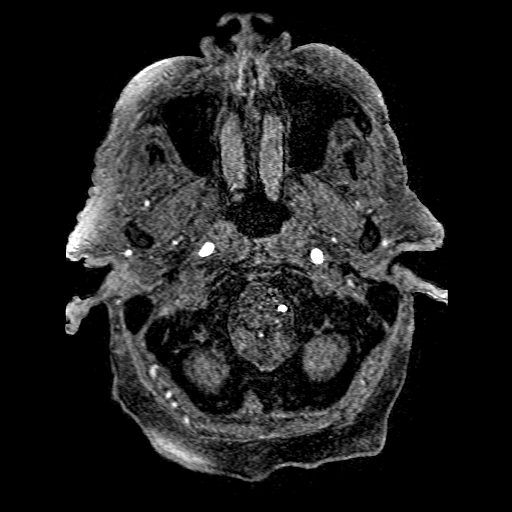
[im 20/184]
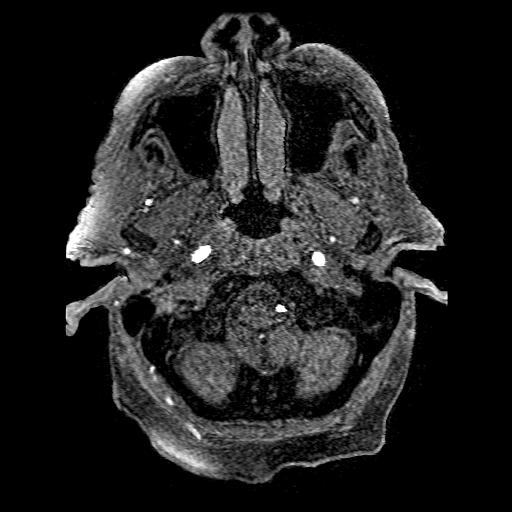
[im 24/184]
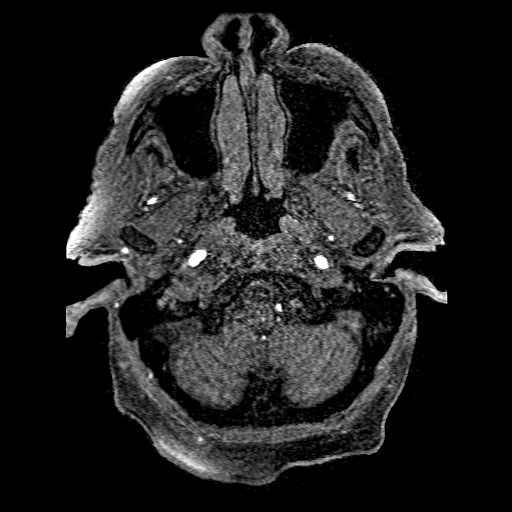
[im 28/184]
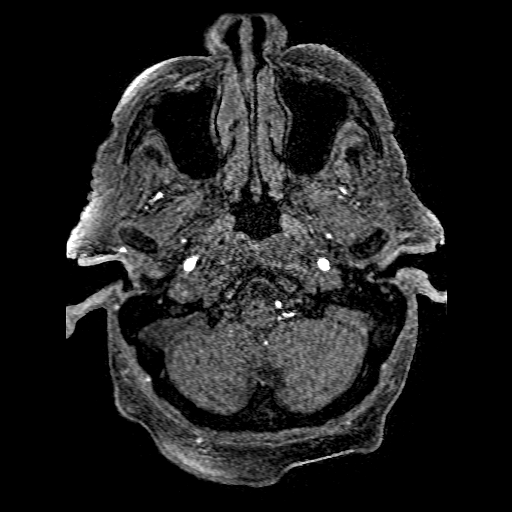
[im 32/184]
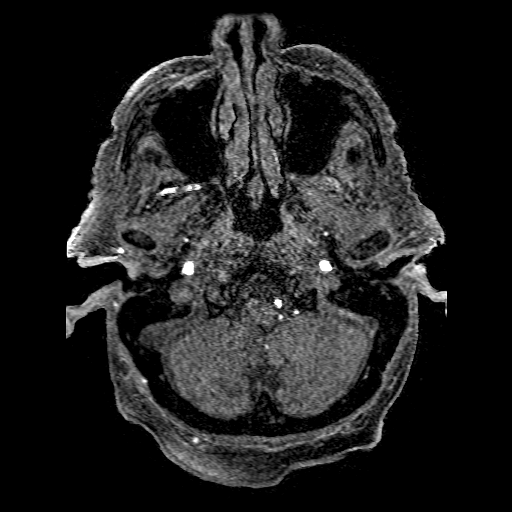
[im 36/184]
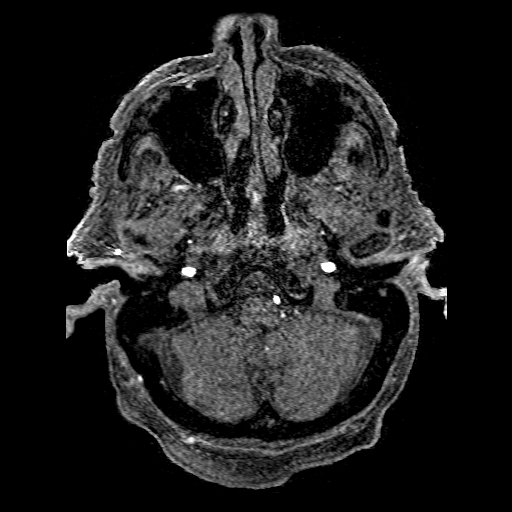
[im 39/184]
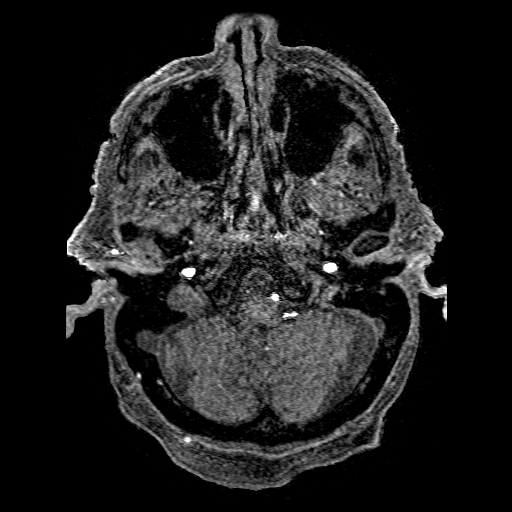
[im 59/184]
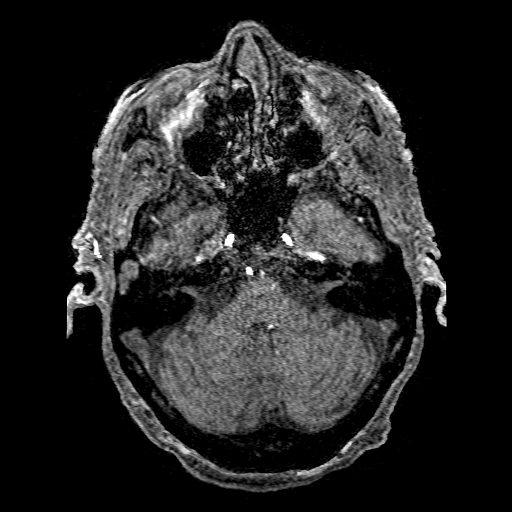
[im 82/184]
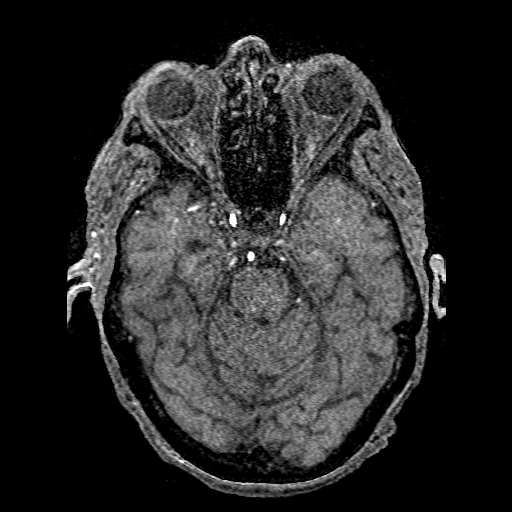
[im 94/184]
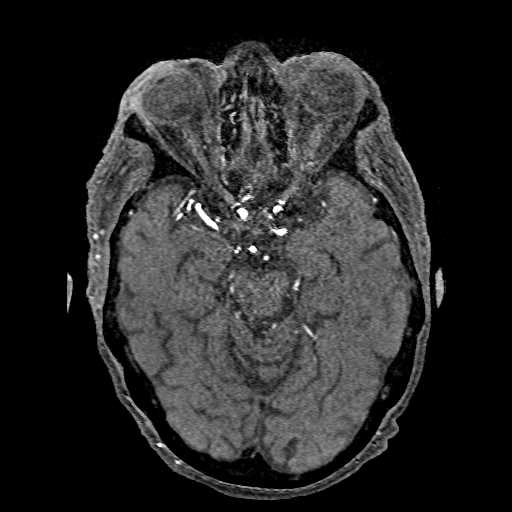
[im 106/184]
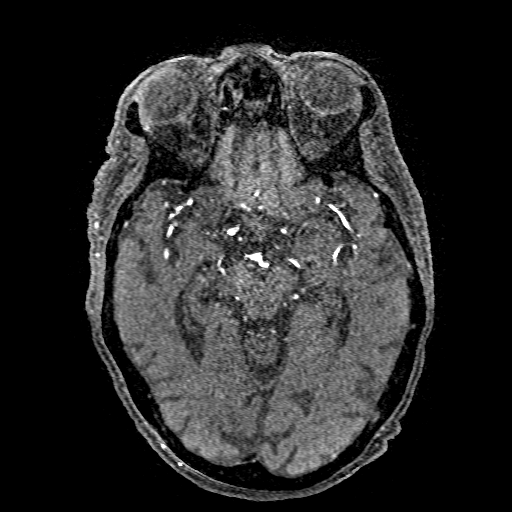
[im 129/184]
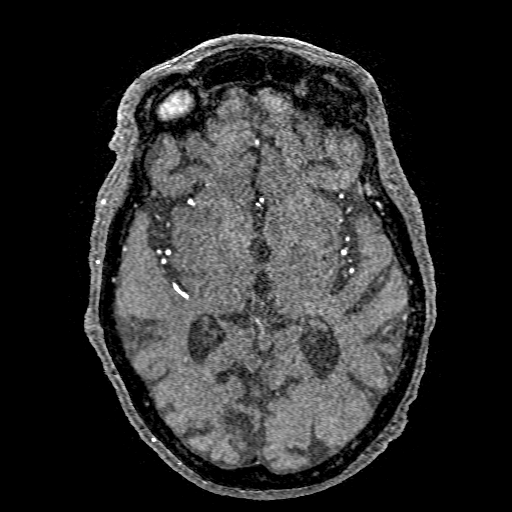
[im 152/184]
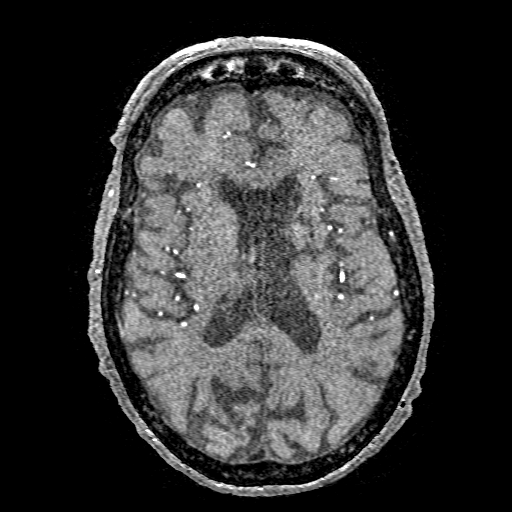
[im 156/184]
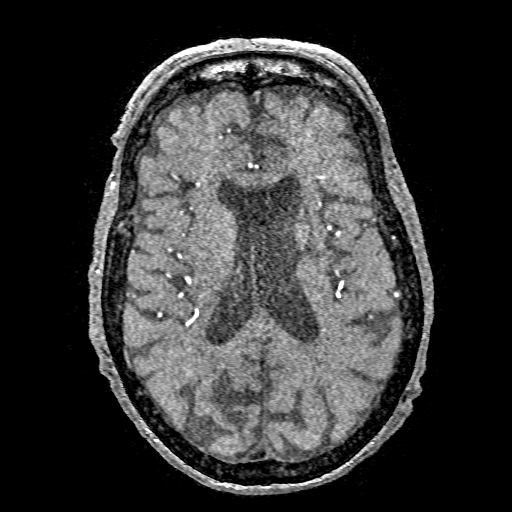
[im 176/184]
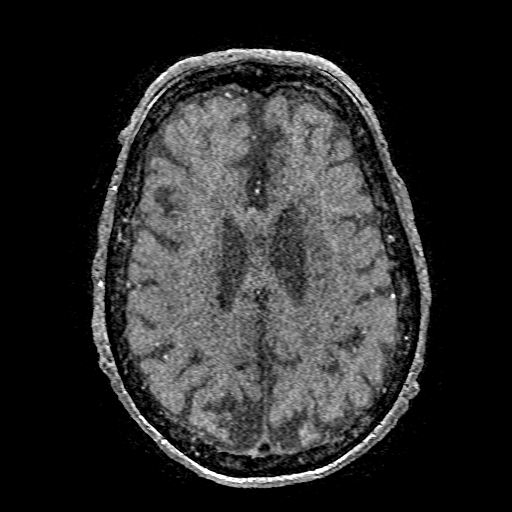

[19 of 48 positions shown; findings below may reference images not displayed]

FINDINGS: The right vertebral artery is likely non dominant. Compared to prior
there is now total absence of visible right vertebral and PICA flow,
which correlates with the acute infarcts. A small stump is seen at
the vertebrobasilar junction. There is atherosclerotic irregularity
of the left V4 segment without flow limiting stenosis.

High-grade proximal basilar stenosis. High-grade narrowing at the
right PCA origin with downstream underfilling when compared to the
left.

Diffuse atherosclerotic irregularity and narrowing of the cavernous
carotids. There is advanced narrowing at the distal left cavernous
ICA flow reducing. Extensive atherosclerotic irregularity at
bilateral MCA branches and at the bilateral distal M1 segment. No
branch occlusion or aneurysm.
IMPRESSION: 1. Severe intracranial atherosclerosis.
2. Faint flow in the right vertebral artery and PICA on 2159
comparison is no longer seen and correlates with the acute infarct.
3. High-grade proximal basilar stenosis.
4. High-grade right PCA origin stenosis with downstream
underfilling.
5. High-grade left cavernous ICA narrowing.
6. Moderate to advanced bilateral M1 stenoses.

## 2020-04-07 IMAGING — US US EXTREM LOW VENOUS*L*
1 series · 13 of 24 positions shown · non-contrast
Comparison: Left lower extremity venous Doppler ultrasound -
05/21/2017

CLINICAL DATA: History of chronic DVT involving the left lower
extremity, now with left calf pain and edema. Evaluate for acute or
chronic DVT.



[Series 1: us extrem low venous*left* · 0.07mm/px · 13 of 44 slices shown]
[im 1/44]
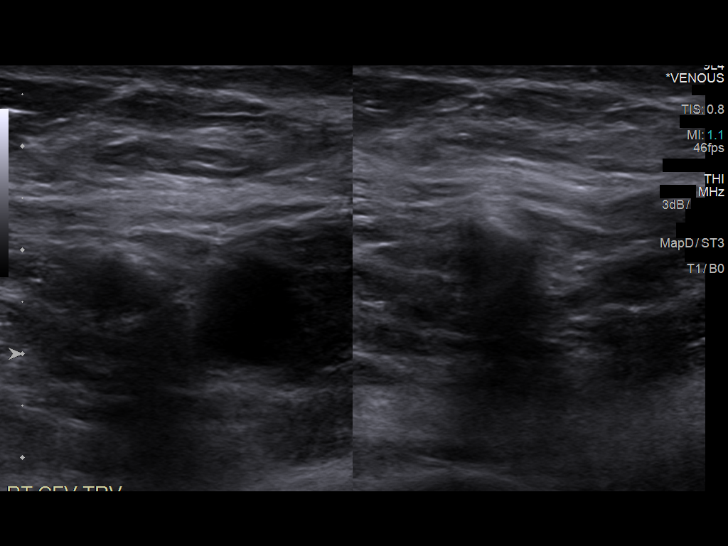
[im 4/44]
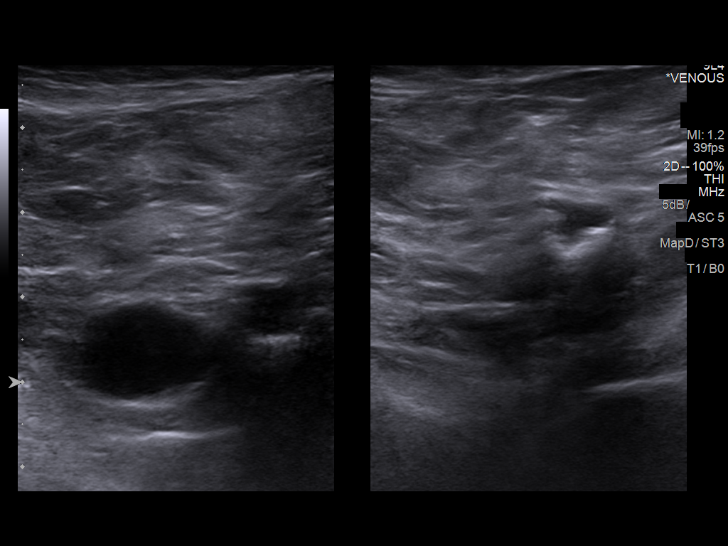
[im 8/44]
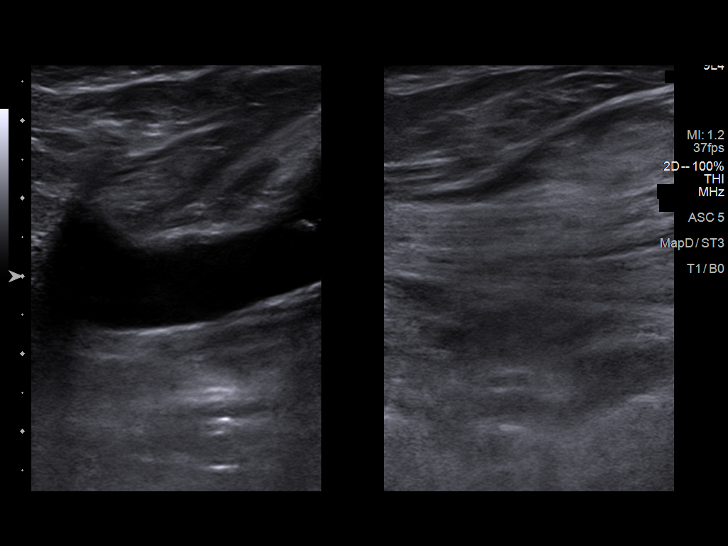
[im 12/44]
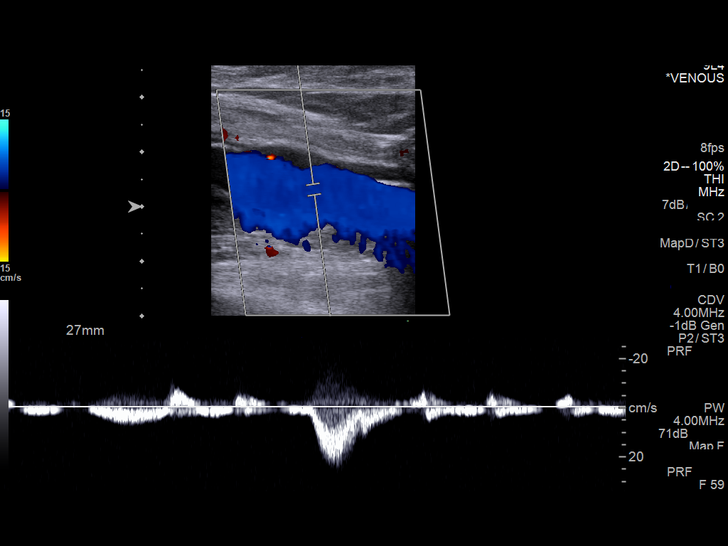
[im 15/44]
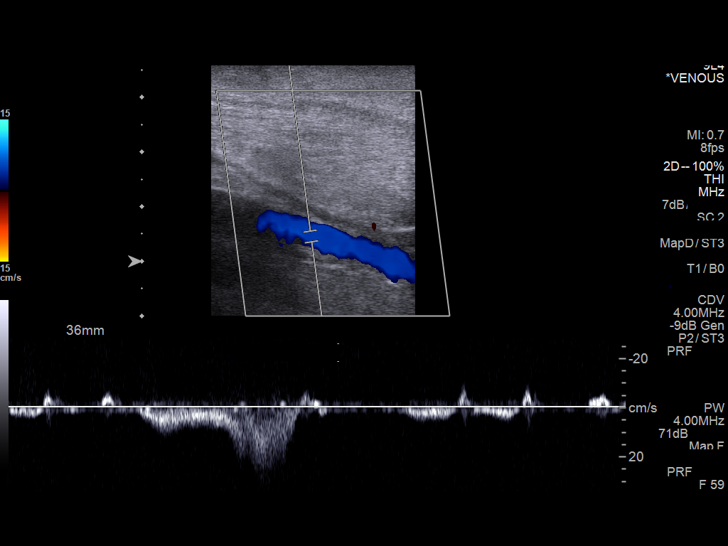
[im 19/44]
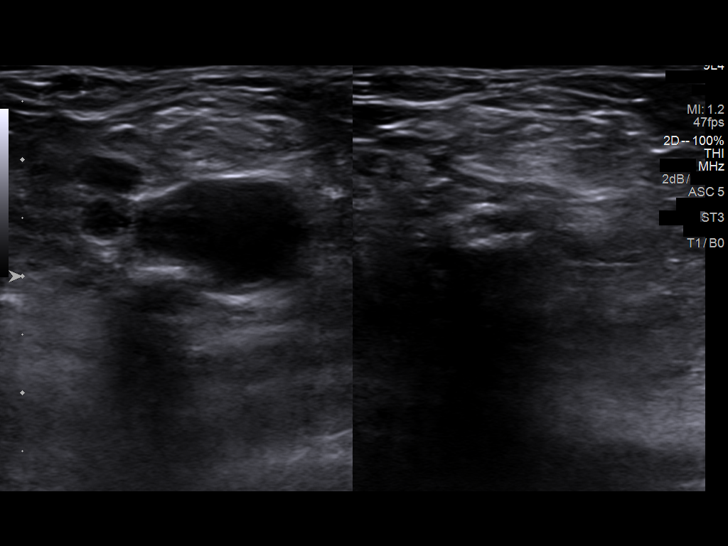
[im 23/44]
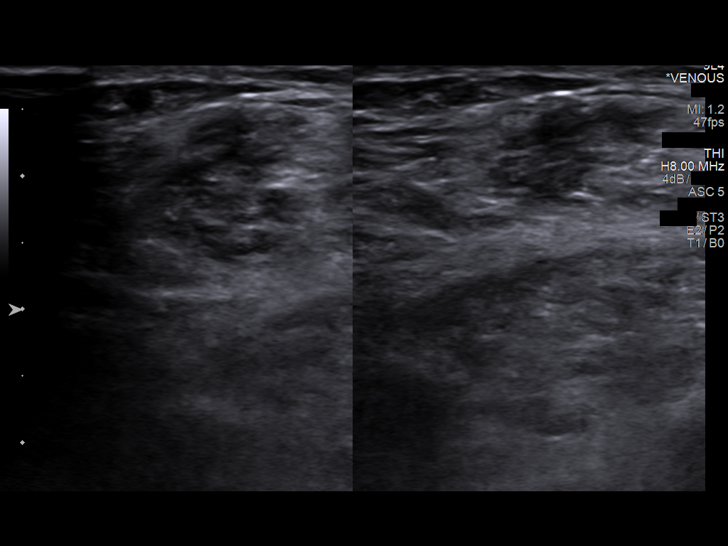
[im 25/44]
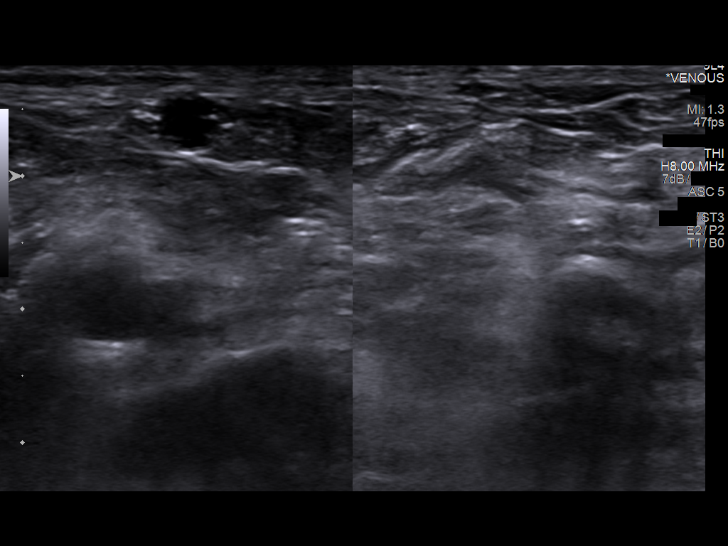
[im 29/44]
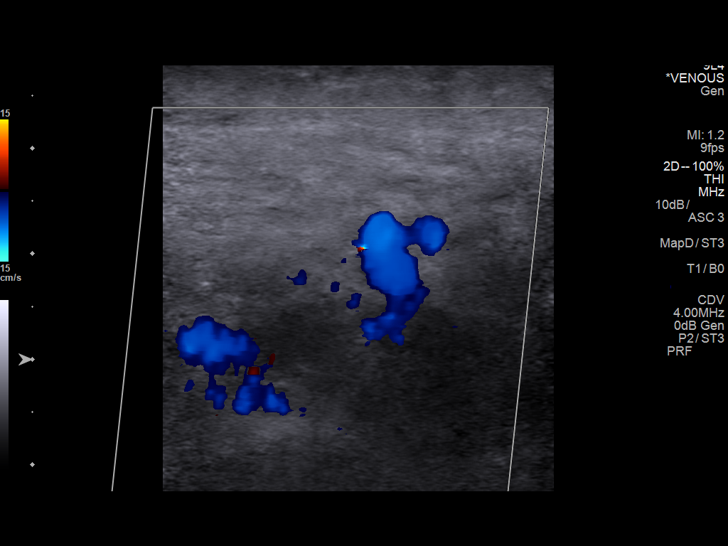
[im 32/44]
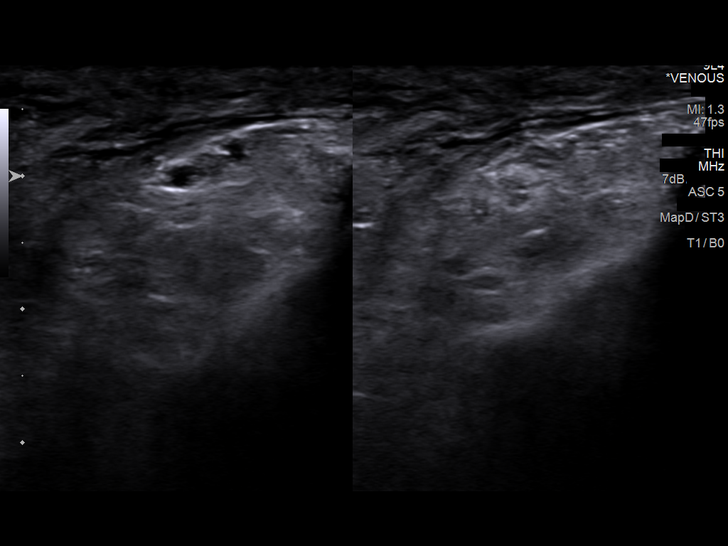
[im 36/44]
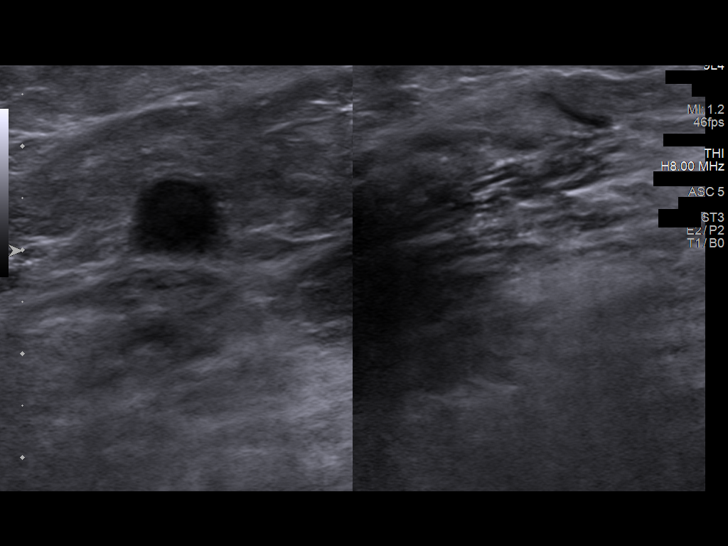
[im 40/44]
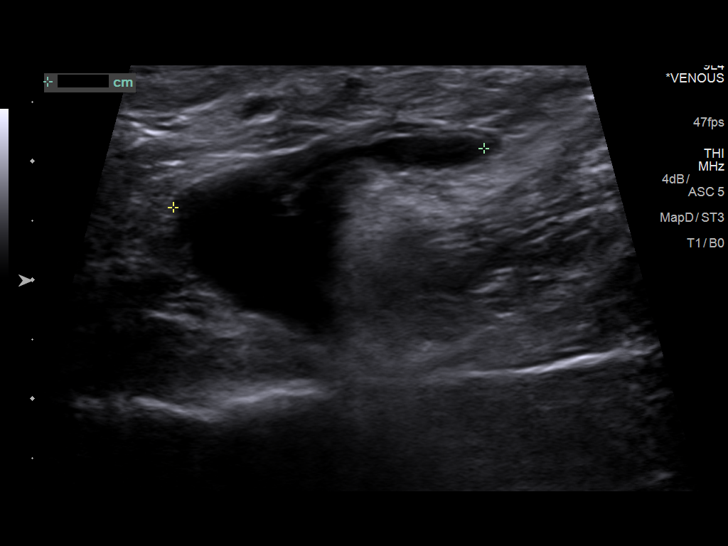
[im 44/44]
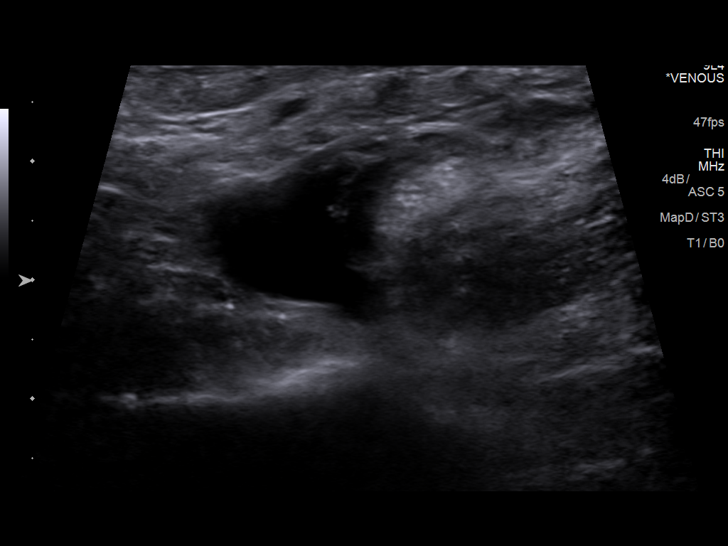

[13 of 24 positions shown; findings below may reference images not displayed]

FINDINGS: Contralateral Common Femoral Vein: Respiratory phasicity is normal
and symmetric with the symptomatic side. No evidence of thrombus.
Normal compressibility.

Common Femoral Vein: No evidence of thrombus. Normal
compressibility, respiratory phasicity and response to augmentation.

Saphenofemoral Junction: No evidence of thrombus. Normal
compressibility and flow on color Doppler imaging.

Profunda Femoral Vein: No evidence of thrombus. Normal
compressibility and flow on color Doppler imaging.

Femoral Vein: No evidence of thrombus. Normal compressibility,
respiratory phasicity and response to augmentation.

Popliteal Vein: No evidence of thrombus. Normal compressibility,
respiratory phasicity and response to augmentation.

Calf Veins: No evidence of acute or chronic thrombus. Normal
compressibility and flow on color Doppler imaging.

Superficial Great Saphenous Vein: No evidence of thrombus. Normal
compressibility.

Venous Reflux:  None.

Other Findings: No evidence of acute or chronic DVT within the left
lesser saphenous vein. A moderate amount of subcutaneous edema is
noted at the level the left ankle (image 36). Note is made of an
approximately 3.0 x 2.0 x 2.7 cm serpiginous fluid collection with
the left popliteal fossa favored to represent a Baker cyst, grossly
unchanged compared to the [DATE] examination.
IMPRESSION: 1. No evidence of acute or chronic DVT or SVT within the left lower
extremity.
2. Grossly unchanged approximately 3.0 cm serpiginous left-sided
Baker cyst.
